# Patient Record
Sex: Male | Born: 1980 | Race: Black or African American | Hispanic: No | Marital: Single | Smoking: Former smoker
Health system: Southern US, Community
[De-identification: ages and names within clinical notes are randomized; demographics above are authoritative.]

## PROBLEM LIST (undated history)

## (undated) DIAGNOSIS — I219 Acute myocardial infarction, unspecified: Secondary | ICD-10-CM

## (undated) DIAGNOSIS — I2510 Atherosclerotic heart disease of native coronary artery without angina pectoris: Secondary | ICD-10-CM

## (undated) DIAGNOSIS — D649 Anemia, unspecified: Secondary | ICD-10-CM

## (undated) DIAGNOSIS — B20 Human immunodeficiency virus [HIV] disease: Secondary | ICD-10-CM

## (undated) DIAGNOSIS — Z8709 Personal history of other diseases of the respiratory system: Secondary | ICD-10-CM

## (undated) DIAGNOSIS — D849 Immunodeficiency, unspecified: Secondary | ICD-10-CM

## (undated) DIAGNOSIS — I252 Old myocardial infarction: Secondary | ICD-10-CM

## (undated) DIAGNOSIS — K219 Gastro-esophageal reflux disease without esophagitis: Secondary | ICD-10-CM

## (undated) DIAGNOSIS — E785 Hyperlipidemia, unspecified: Secondary | ICD-10-CM

## (undated) DIAGNOSIS — I255 Ischemic cardiomyopathy: Secondary | ICD-10-CM

## (undated) DIAGNOSIS — Z86718 Personal history of other venous thrombosis and embolism: Secondary | ICD-10-CM

## (undated) DIAGNOSIS — J189 Pneumonia, unspecified organism: Secondary | ICD-10-CM

## (undated) DIAGNOSIS — J449 Chronic obstructive pulmonary disease, unspecified: Secondary | ICD-10-CM

## (undated) DIAGNOSIS — L309 Dermatitis, unspecified: Secondary | ICD-10-CM

## (undated) DIAGNOSIS — J4540 Moderate persistent asthma, uncomplicated: Secondary | ICD-10-CM

## (undated) DIAGNOSIS — Z21 Asymptomatic human immunodeficiency virus [HIV] infection status: Secondary | ICD-10-CM

## (undated) DIAGNOSIS — I10 Essential (primary) hypertension: Secondary | ICD-10-CM

## (undated) HISTORY — PX: JOINT REPLACEMENT: SHX530

## (undated) HISTORY — DX: Atherosclerotic heart disease of native coronary artery without angina pectoris: I25.10

## (undated) HISTORY — DX: Dermatitis, unspecified: L30.9

## (undated) HISTORY — DX: Ischemic cardiomyopathy: I25.5

## (undated) HISTORY — DX: Old myocardial infarction: I25.2

## (undated) HISTORY — DX: Personal history of other venous thrombosis and embolism: Z86.718

## (undated) HISTORY — DX: Essential (primary) hypertension: I10

## (undated) HISTORY — DX: Chronic obstructive pulmonary disease, unspecified: J44.9

## (undated) HISTORY — DX: Human immunodeficiency virus (HIV) disease: B20

## (undated) HISTORY — DX: Asymptomatic human immunodeficiency virus (hiv) infection status: Z21

## (undated) HISTORY — DX: Moderate persistent asthma, uncomplicated: J45.40

## (undated) HISTORY — DX: Personal history of other diseases of the respiratory system: Z87.09

## (undated) HISTORY — DX: Hyperlipidemia, unspecified: E78.5

## (undated) SURGERY — VIDEO BRONCHOSCOPY WITHOUT FLUORO
Anesthesia: Moderate Sedation

---

## 2007-10-12 ENCOUNTER — Emergency Department (HOSPITAL_COMMUNITY): Admission: EM | Admit: 2007-10-12 | Discharge: 2007-10-12 | Payer: Self-pay | Admitting: Emergency Medicine

## 2008-05-24 ENCOUNTER — Emergency Department (HOSPITAL_COMMUNITY): Admission: EM | Admit: 2008-05-24 | Discharge: 2008-05-25 | Payer: Self-pay | Admitting: Emergency Medicine

## 2008-07-07 ENCOUNTER — Emergency Department (HOSPITAL_COMMUNITY): Admission: EM | Admit: 2008-07-07 | Discharge: 2008-07-08 | Payer: Self-pay | Admitting: Emergency Medicine

## 2008-08-29 ENCOUNTER — Emergency Department (HOSPITAL_COMMUNITY): Admission: EM | Admit: 2008-08-29 | Discharge: 2008-08-29 | Payer: Self-pay | Admitting: Emergency Medicine

## 2008-09-03 ENCOUNTER — Emergency Department (HOSPITAL_COMMUNITY): Admission: EM | Admit: 2008-09-03 | Discharge: 2008-09-03 | Payer: Self-pay | Admitting: Emergency Medicine

## 2008-09-12 ENCOUNTER — Emergency Department (HOSPITAL_COMMUNITY): Admission: EM | Admit: 2008-09-12 | Discharge: 2008-09-12 | Payer: Self-pay | Admitting: Emergency Medicine

## 2009-06-14 ENCOUNTER — Emergency Department (HOSPITAL_COMMUNITY): Admission: EM | Admit: 2009-06-14 | Discharge: 2009-06-14 | Payer: Self-pay | Admitting: Emergency Medicine

## 2009-06-15 ENCOUNTER — Encounter: Payer: Self-pay | Admitting: Emergency Medicine

## 2009-06-15 ENCOUNTER — Inpatient Hospital Stay (HOSPITAL_COMMUNITY): Admission: EM | Admit: 2009-06-15 | Discharge: 2009-06-24 | Payer: Self-pay | Admitting: Cardiology

## 2009-06-17 HISTORY — PX: CARDIAC CATHETERIZATION: SHX172

## 2009-06-24 HISTORY — PX: CARDIAC CATHETERIZATION: SHX172

## 2009-08-27 ENCOUNTER — Emergency Department (HOSPITAL_COMMUNITY): Admission: EM | Admit: 2009-08-27 | Discharge: 2009-08-27 | Payer: Self-pay | Admitting: Family Medicine

## 2009-12-09 HISTORY — PX: US ECHOCARDIOGRAPHY: HXRAD669

## 2010-02-19 ENCOUNTER — Emergency Department (HOSPITAL_COMMUNITY): Admission: EM | Admit: 2010-02-19 | Discharge: 2010-02-19 | Payer: Self-pay | Admitting: Emergency Medicine

## 2010-02-24 ENCOUNTER — Emergency Department (HOSPITAL_COMMUNITY): Admission: EM | Admit: 2010-02-24 | Discharge: 2010-02-24 | Payer: Self-pay | Admitting: Emergency Medicine

## 2010-02-25 ENCOUNTER — Emergency Department (HOSPITAL_COMMUNITY): Admission: EM | Admit: 2010-02-25 | Discharge: 2010-02-25 | Payer: Self-pay | Admitting: Emergency Medicine

## 2010-04-02 ENCOUNTER — Emergency Department (HOSPITAL_COMMUNITY): Admission: EM | Admit: 2010-04-02 | Discharge: 2010-04-02 | Payer: Self-pay | Admitting: Emergency Medicine

## 2010-09-08 ENCOUNTER — Ambulatory Visit: Payer: Self-pay | Admitting: Cardiovascular Disease

## 2010-10-01 ENCOUNTER — Emergency Department (HOSPITAL_COMMUNITY): Admission: EM | Admit: 2010-10-01 | Discharge: 2010-10-01 | Payer: Self-pay | Admitting: Emergency Medicine

## 2011-03-18 LAB — BASIC METABOLIC PANEL
BUN: 9 mg/dL (ref 6–23)
CO2: 26 mEq/L (ref 19–32)
Calcium: 8.6 mg/dL (ref 8.4–10.5)
Chloride: 103 mEq/L (ref 96–112)
GFR calc Af Amer: >60 mL/min (ref >60)
Glucose, Bld: 98 mg/dL (ref 70–99)

## 2011-03-18 LAB — PROTIME-INR
INR: 1.07 (ref 0.00–1.49)
Prothrombin Time: 13.8 seconds (ref 11.6–15.2)

## 2011-03-18 LAB — CBC
Hemoglobin: 14 g/dL (ref 13.0–17.0)
MCHC: 34.5 g/dL (ref 30.0–36.0)
RBC: 4.25 MIL/uL (ref 4.22–5.81)
WBC: 6.2 10*3/uL (ref 4.0–10.5)

## 2011-03-18 LAB — POCT CARDIAC MARKERS
CKMB, poc: <1 ng/mL — ABNORMAL LOW (ref 1.0–8.0)
Myoglobin, poc: 30.9 ng/mL (ref 12–200)
Troponin i, poc: <0.05 ng/mL (ref 0.00–0.09)

## 2011-03-18 LAB — CK TOTAL AND CKMB (NOT AT ARMC)
Relative Index: 0.7 (ref 0.0–2.5)
Total CK: 147 U/L (ref 7–232)

## 2011-03-18 LAB — RAPID URINE DRUG SCREEN, HOSP PERFORMED
Opiates: NOT DETECTED
Tetrahydrocannabinol: POSITIVE — AB

## 2011-03-18 LAB — DIFFERENTIAL
Monocytes Relative: 10 % (ref 3–12)
Neutro Abs: 3.6 10*3/uL (ref 1.7–7.7)
Neutrophils Relative %: 58 % (ref 43–77)

## 2011-03-18 LAB — TROPONIN I: Troponin I: 0.01 ng/mL (ref 0.00–0.06)

## 2011-04-04 LAB — POCT RAPID STREP A (OFFICE): Streptococcus, Group A Screen (Direct): NEGATIVE

## 2011-04-06 LAB — RAPID URINE DRUG SCREEN, HOSP PERFORMED
Amphetamines: NOT DETECTED
Barbiturates: POSITIVE — AB
Benzodiazepines: NOT DETECTED

## 2011-04-06 LAB — CBC
HCT: 32 % — ABNORMAL LOW (ref 39.0–52.0)
HCT: 33.8 % — ABNORMAL LOW (ref 39.0–52.0)
HCT: 34.4 % — ABNORMAL LOW (ref 39.0–52.0)
HCT: 36.5 % — ABNORMAL LOW (ref 39.0–52.0)
HCT: 36.8 % — ABNORMAL LOW (ref 39.0–52.0)
HCT: 40 % (ref 39.0–52.0)
HCT: 41.3 % (ref 39.0–52.0)
Hemoglobin: 11.3 g/dL — ABNORMAL LOW (ref 13.0–17.0)
Hemoglobin: 11.4 g/dL — ABNORMAL LOW (ref 13.0–17.0)
Hemoglobin: 12 g/dL — ABNORMAL LOW (ref 13.0–17.0)
Hemoglobin: 12.3 g/dL — ABNORMAL LOW (ref 13.0–17.0)
Hemoglobin: 12.3 g/dL — ABNORMAL LOW (ref 13.0–17.0)
Hemoglobin: 13.8 g/dL (ref 13.0–17.0)
Hemoglobin: 13.9 g/dL (ref 13.0–17.0)
MCHC: 33.4 g/dL (ref 30.0–36.0)
MCHC: 34.2 g/dL (ref 30.0–36.0)
MCHC: 34.4 g/dL (ref 30.0–36.0)
MCHC: 34.5 g/dL (ref 30.0–36.0)
MCHC: 34.5 g/dL (ref 30.0–36.0)
MCHC: 34.6 g/dL (ref 30.0–36.0)
MCHC: 35.3 g/dL (ref 30.0–36.0)
MCV: 92.6 fL (ref 78.0–100.0)
MCV: 94.1 fL (ref 78.0–100.0)
MCV: 94.8 fL (ref 78.0–100.0)
MCV: 94.9 fL (ref 78.0–100.0)
MCV: 94.9 fL (ref 78.0–100.0)
MCV: 94.9 fL (ref 78.0–100.0)
MCV: 95.3 fL (ref 78.0–100.0)
MCV: 95.3 fL (ref 78.0–100.0)
Platelets: 212 10*3/uL (ref 150–400)
Platelets: 217 10*3/uL (ref 150–400)
Platelets: 259 10*3/uL (ref 150–400)
RBC: 3.36 MIL/uL — ABNORMAL LOW (ref 4.22–5.81)
RBC: 3.63 MIL/uL — ABNORMAL LOW (ref 4.22–5.81)
RBC: 3.63 MIL/uL — ABNORMAL LOW (ref 4.22–5.81)
RBC: 3.7 MIL/uL — ABNORMAL LOW (ref 4.22–5.81)
RBC: 3.72 MIL/uL — ABNORMAL LOW (ref 4.22–5.81)
RBC: 3.83 MIL/uL — ABNORMAL LOW (ref 4.22–5.81)
RBC: 3.87 MIL/uL — ABNORMAL LOW (ref 4.22–5.81)
RBC: 3.88 MIL/uL — ABNORMAL LOW (ref 4.22–5.81)
RBC: 4.03 MIL/uL — ABNORMAL LOW (ref 4.22–5.81)
RBC: 4.28 MIL/uL (ref 4.22–5.81)
RBC: 4.35 MIL/uL (ref 4.22–5.81)
RDW: 13.3 % (ref 11.5–15.5)
RDW: 13.4 % (ref 11.5–15.5)
RDW: 13.4 % (ref 11.5–15.5)
WBC: 12.2 10*3/uL — ABNORMAL HIGH (ref 4.0–10.5)
WBC: 5.6 10*3/uL (ref 4.0–10.5)
WBC: 6 10*3/uL (ref 4.0–10.5)
WBC: 7 10*3/uL (ref 4.0–10.5)
WBC: 7.9 10*3/uL (ref 4.0–10.5)
WBC: 8.1 10*3/uL (ref 4.0–10.5)
WBC: 8.1 10*3/uL (ref 4.0–10.5)
WBC: 9.6 10*3/uL (ref 4.0–10.5)

## 2011-04-06 LAB — COMPREHENSIVE METABOLIC PANEL
Alkaline Phosphatase: 70 U/L (ref 39–117)
BUN: 9 mg/dL (ref 6–23)
CO2: 26 mEq/L (ref 19–32)
Calcium: 8.2 mg/dL — ABNORMAL LOW (ref 8.4–10.5)
GFR calc non Af Amer: >60 mL/min (ref >60)
Glucose, Bld: 105 mg/dL — ABNORMAL HIGH (ref 70–99)
Total Protein: 6.7 g/dL (ref 6.0–8.3)

## 2011-04-06 LAB — BASIC METABOLIC PANEL
BUN: 9 mg/dL (ref 6–23)
CO2: 28 mEq/L (ref 19–32)
CO2: 32 mEq/L (ref 19–32)
Calcium: 8.6 mg/dL (ref 8.4–10.5)
Chloride: 103 mEq/L (ref 96–112)
Chloride: 104 mEq/L (ref 96–112)
Chloride: 105 mEq/L (ref 96–112)
Chloride: 107 mEq/L (ref 96–112)
Chloride: 107 mEq/L (ref 96–112)
Creatinine, Ser: 0.87 mg/dL (ref 0.4–1.5)
Creatinine, Ser: 0.93 mg/dL (ref 0.4–1.5)
GFR calc Af Amer: >60 mL/min (ref >60)
GFR calc Af Amer: >60 mL/min (ref >60)
GFR calc Af Amer: >60 mL/min (ref >60)
GFR calc non Af Amer: >60 mL/min (ref >60)
GFR calc non Af Amer: >60 mL/min (ref >60)
Potassium: 3.1 mEq/L — ABNORMAL LOW (ref 3.5–5.1)
Potassium: 3.3 mEq/L — ABNORMAL LOW (ref 3.5–5.1)
Potassium: 4.4 mEq/L (ref 3.5–5.1)
Potassium: 4.4 mEq/L (ref 3.5–5.1)
Sodium: 133 mEq/L — ABNORMAL LOW (ref 135–145)
Sodium: 139 mEq/L (ref 135–145)
Sodium: 140 mEq/L (ref 135–145)

## 2011-04-06 LAB — HEPARIN LEVEL (UNFRACTIONATED)
Heparin Unfractionated: 0.1 IU/mL — ABNORMAL LOW (ref 0.30–0.70)
Heparin Unfractionated: 0.16 IU/mL — ABNORMAL LOW (ref 0.30–0.70)
Heparin Unfractionated: 0.52 IU/mL (ref 0.30–0.70)
Heparin Unfractionated: 0.61 IU/mL (ref 0.30–0.70)
Heparin Unfractionated: <0.1 IU/mL — ABNORMAL LOW (ref 0.30–0.70)

## 2011-04-06 LAB — POCT CARDIAC MARKERS
CKMB, poc: 2.4 ng/mL (ref 1.0–8.0)
Myoglobin, poc: 36.8 ng/mL (ref 12–200)
Myoglobin, poc: >500 ng/mL (ref 12–200)
Troponin i, poc: <0.05 ng/mL (ref 0.00–0.09)
Troponin i, poc: <0.05 ng/mL (ref 0.00–0.09)

## 2011-04-06 LAB — DIFFERENTIAL
Basophils Absolute: 0 10*3/uL (ref 0.0–0.1)
Basophils Relative: 0 % (ref 0–1)
Basophils Relative: 0 % (ref 0–1)
Eosinophils Absolute: 0 10*3/uL (ref 0.0–0.7)
Eosinophils Relative: 0 % (ref 0–5)
Lymphocytes Relative: 20 % (ref 12–46)
Lymphs Abs: 1.5 10*3/uL (ref 0.7–4.0)
Monocytes Relative: 11 % (ref 3–12)
Neutro Abs: 8.9 10*3/uL — ABNORMAL HIGH (ref 1.7–7.7)
Neutrophils Relative %: 76 % (ref 43–77)

## 2011-04-06 LAB — CARDIAC PANEL(CRET KIN+CKTOT+MB+TROPI)
CK, MB: 32.3 ng/mL — ABNORMAL HIGH (ref 0.3–4.0)
CK, MB: 58.8 ng/mL — ABNORMAL HIGH (ref 0.3–4.0)
Relative Index: 5.2 — ABNORMAL HIGH (ref 0.0–2.5)
Total CK: 622 U/L — ABNORMAL HIGH (ref 7–232)

## 2011-04-06 LAB — LIPID PANEL
HDL: 24 mg/dL — ABNORMAL LOW (ref >39)
LDL Cholesterol: 63 mg/dL (ref 0–99)
Total CHOL/HDL Ratio: 4 RATIO
Triglycerides: 47 mg/dL (ref <150)
VLDL: 9 mg/dL (ref 0–40)

## 2011-04-06 LAB — LIPASE, BLOOD: Lipase: 16 U/L (ref 11–59)

## 2011-04-06 LAB — CK TOTAL AND CKMB (NOT AT ARMC)
CK, MB: 146.5 ng/mL — ABNORMAL HIGH (ref 0.3–4.0)
Relative Index: 13.3 — ABNORMAL HIGH (ref 0.0–2.5)
Total CK: 1100 U/L — ABNORMAL HIGH (ref 7–232)

## 2011-04-06 LAB — APTT: aPTT: 43 seconds — ABNORMAL HIGH (ref 24–37)

## 2011-04-06 LAB — C-REACTIVE PROTEIN: CRP: 3.4 mg/dL — ABNORMAL HIGH (ref <0.6)

## 2011-05-12 NOTE — Cardiovascular Report (Signed)
NAMENOAL, Rodney Walker             ACCOUNT NO.:  [id number]   MEDICAL RECORD NO.:  [id number]          PATIENT TYPE:  INP   LOCATION:  3729                         FACILITY:  [facility]   PHYSICIAN:  Vesta Mixer, M.D. DATE OF BIRTH:  16-Nov-1981   DATE OF PROCEDURE:  06/18/2009  DATE OF DISCHARGE:                            CARDIAC CATHETERIZATION   Abbas Beyene is a 30 year old gentleman who was admitted over the  week with an acute anterior wall myocardial infarction.  He stabilized  quite nicely with heparin and Dilaudid and remained painfree.  He had  markedly elevated cardiac enzymes but was painfree and did not have any  further EKG changes.  He had a heart catheterization yesterday which  revealed an ulcerated plaque associated with a large thrombus.  He was  placed on Integrilin and was brought back today for restudy.  We were  prepared to do PTCA and stenting if his lesion looked any worse.   The procedure was a left heart catheterization with limited coronary  angiography.  We engaged the left main only.  We did not do a left  ventriculogram and did not do a right coronary artery injection.   The left main is smooth and normal.   Left anterior descending artery has 40-50% stenosis.  The lesion is  clean up quite nicely.  The large thrombus burden has improved  significantly.  The intramural hematoma seems to also have improved.  There is brisk flow down the vessel.   The first diagonal artery is normal.   The diagonal artery is normal.  The left circumflex artery is  essentially normal.   We will continue with Integrilin for another couple of days.  We will  discharge him on Plavix.  I do not think that he will need another heart  catheterization since he has made such a significant improvement.  We  will consider that over the next several days.  We will continue with  high-dose Crestor therapy.      Vesta Mixer, M.D.  Electronically Signed     PJN/MEDQ  D:  06/18/2009  T:  06/19/2009  Job:  [id number]

## 2011-05-12 NOTE — Cardiovascular Report (Signed)
NAMELYNKIN, SAINI             ACCOUNT NO.:  [id number]   MEDICAL RECORD NO.:  [id number]           PATIENT TYPE:   LOCATION:                                 FACILITY:   PHYSICIAN:  Vesta Mixer, M.D. DATE OF BIRTH:  January 07, 1981   DATE OF PROCEDURE:  06/24/2009  DATE OF DISCHARGE:                            CARDIAC CATHETERIZATION   Rodney Walker is a 30 year old gentleman who presented last week with  an anterior wall myocardial infarction.  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  He was found to have a moderately tight stenosis  with a large subintimal hematoma and thrombus within the lumen of the  LAD.  He was treated with Integrilin for several days and Plavix.  Repeat heart catheterization the following day revealed some  improvement.  He was then scheduled for repeat heart catheterization  today prior to discharge.   The procedure was left heart catheterization with coronary angiography.   The right femoral artery was easily cannulated using a modified  Seldinger technique.  At the end of the case, a right femoral angiogram  revealed satisfactory landmarks and a satisfactory lumen to perform  Angio-Seal.  The vessel was closed with Angio-Seal using a standard  fashion.   HEMODYNAMICS:  LV pressure is 103/7.  The aortic pressure was 103/66.   Angiography:  Left main:  The left main is smooth and normal.   The left anterior descending artery has healed up quite nicely.  The  proximal LAD has only minor luminal irregularities.  There is no  evidence of thrombus, and there is no subintimal hematoma.  There is no  evidence of dissection.  There is brisk flow.  The septal branches,  which had been previously slightly compromised now have brisk flow.   The first diagonal artery is normal.  The remaining LAD is normal.   The left circumflex artery is large and normal.  The obtuse  marginal  artery is normal.  The posterolateral segment artery is normal.   The right coronary artery is large and normal.  The posterior descending  artery is normal.   The left ventriculogram was performed in the 30-RAO position.  It  revealed very small area of apical hypokinesis.  The overall ejection  fraction is normal with an EF of 60%.  The apex is healed up quite  nicely as well.   COMPLICATIONS:  None.   CONCLUSIONS:  Minor irregularities in the proximal left anterior  descending.  This lesion has healed up quite nicely on Plavix.  We will  continue with Plavix and aspirin as an outpatient.  He will see me in  followup.  We will continue with the same medications.  We will  anticipate discharge from hospital today.      Vesta Mixer, M.D.  Electronically Signed     PJN/MEDQ  D:  06/24/2009  T:  06/24/2009  Job:  [id number]

## 2011-05-12 NOTE — Discharge Summary (Signed)
NAMELELON, IKARD             ACCOUNT NO.:  [id number]   MEDICAL RECORD NO.:  [id number]          PATIENT TYPE:  INP   LOCATION:  3739                         FACILITY:  [facility]   PHYSICIAN:  Vesta Mixer, M.D. 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   DISCHARGE MEDICATIONS:  1. Plavix 75 mg a day for at least 3 months.  2. Aspirin 81 mg a day.  3. Pepcid 20 mg, over the counter, once a day.  4. Simvastatin 40 mg a day.  5. Metoprolol ER 25 mg once a day.  6. Potassium chloride 10 mEq once a day.   DISPOSITION:  The patient will see Dr. Elease Hashimoto in several weeks.  We will  be giving him a 14 days supply of Plavix upon discharge.   HISTORY:  [name] is a 30 year old gentleman, who was admitted on June 15, 2009, by Dr. Patty Sermons for chest pain.  He was treated with heparin  and nitroglycerin, and the pain resolved very quickly.  The patient  ruled in for myocardial infarction.  Please see dictated H and P.   HOSPITAL COURSE BY PROBLEMS:  1. Chest pain:  The patient ruled in for myocardial infarction.  He      had T-wave inversions anteriorly, consistent with an anterior wall      myocardial infarction, but his symptoms were very stable.  He had a      catheterization the following Monday, which revealed a large      thrombus burden in the proximal LAD, associated with a moderate 50-      60% stenosis.  There appeared to be a spontaneous dissection of the      LAD.  We started him on Integrilin and restudied him the next day.      By that time, the thrombus had greatly decreased in size and the      vessel has started to heal.  We decided not to place a stent but      rather continue to watch.  He was treated with Integrilin for  another couple of days and was started on Plavix.  We re-cathed him      on June 24, 2009, and his vessel is now completely healed.  There      is virtually no stenosis at all in the proximal LAD.  There is no      associated thrombus, and the subintimal hematoma is now resolved.      There is brisk flow through the LAD.  His left ventriculogram reveals a normal ejection fraction of 60%.  There is hypokinesis of the apex, but this also has greatly improved  since last week.  The patient is now stable upon discharge.  He will be  discharged on the above-noted medications.  The patient has been  instructed to stop smoking.  We will treat him with simvastatin  empirically.  We will follow him in the office.  1. Hypokalemia:  The patient's potassium was 3.1 upon admission.  We      will send home on potassium chloride 10 mEq a day.      Vesta Mixer, M.D.  Electronically Signed     PJN/MEDQ  D:  06/24/2009  T:  06/24/2009  Job:  [id number]

## 2011-05-12 NOTE — H&P (Signed)
Rodney Walker, Rodney Walker             ACCOUNT NO.:  [id number]   MEDICAL RECORD NO.:  [id number]          PATIENT TYPE:  INP   LOCATION:  3729                         FACILITY:  [facility]   PHYSICIAN:  Cassell Clement, M.D. DATE OF BIRTH:  09/24/1981   DATE OF ADMISSION:  06/15/2009  DATE OF DISCHARGE:                              HISTORY & PHYSICAL   CHIEF COMPLAINT:  Chest pain.   HISTORY:  This is a 30 year old single African American male admitted  with chest pain and positive cardiac enzymes.  He does not have any  prior history of known heart problems.  He has been sick for about a  week.  Initially he has been having diarrhea for the past week and a  feeling of gas in his chest.  He had the onset of chest pain on the  evening of Thursday, June 13, 2009.  He was seen at [facility] early  Friday morning where two sets of enzymes were negative and he was sent  home with Prilosec.  The pain recurred last night and the patient  decided to go to [facility] this time, and this time his enzymes were  positive and his EKG showed changes suggesting benign early  repolarization.  A drug screen was positive for marijuana, but negative  for cocaine.  The patient was given IV Dilaudid 1 mg and was given  Zofran intravenously in the emergency room at [facility] after  which his pain has disappeared.  He subsequently also had a CT angiogram  at [facility] which showed no evidence of pulmonary embolus or  dissection or other abnormality.  He is admitted now for further  evaluation at [facility].   FAMILY HISTORY:  Negative for premature coronary artery disease.  His  father died of liver problem at age 59 and mother is still living at age  18, had diabetes but no known coronary artery disease.  The patient has  7 siblings, none of whom have heart problems.   SOCIAL HISTORY:  The patient is single.  He is presently unemployed.  He  smokes occasional cigarettes and occasional alcohol.  The  patient has  never been hospitalized.  He has never had any surgery.  He has no known  drug allergies.   REVIEW OF SYSTEMS:  GASTROINTESTINAL:  Recent diarrhea.  He has not been  aware of any hematochezia or melena.  GENITOURINARY:  No symptoms.  RESPIRATORY:  He had pneumonia in October and was seen in the emergency  room and was treated as an outpatient.  Remainder of review of systems  negative in detail.   PHYSICAL EXAMINATION:  VITAL SIGNS:  Blood pressure of 126/80, pulse is  66 and regular, and respirations are normal on oxygen.  GENERAL:  This is a well-developed, well-nourished, muscular African  American young man in no acute distress.  SKIN:  Clear.  HEENT:  Pupils were equal and reactive.  Sclerae nonicteric.  Mouth and  pharynx normal.  NECK:  Carotids normal.  Jugular venous pressure normal.  Thyroid  normal.  CHEST:  Clear to percussion and auscultation.  HEART:  A quiet precordium without murmur, gallop, rub, or click.  ABDOMEN:  Soft and nontender without hepatosplenomegaly or masses.  EXTREMITIES:  Good peripheral pulses.  No edema.  No phlebitis.  NEUROLOGIC:  Physiologic.   CT angio at rest of the lungs showed no dissection and no pulmonary  emboli.   LABORATORY STUDIES:  Hemoglobin of 13.8, white count 11,700.  Sodium  137, potassium 3.2, BUN 9, creatinine 0.92, and blood sugar 111.  His  AST is elevated at 73, albumin is 2.9.  Troponin is 11.45, total CK is  1100, and MB is 146.5.   IMPRESSION:  Chest pain and cardiac enzymes consistent with non-Q  myocardial infarction.  No evidence historically or by drug screen of  cocaine abuse.  The patient is presently painfree and has been painfree  since given medication at [facility] Room this morning.  He  does not have any obvious risk factors for premature coronary artery  disease, although we do not have his lipids back yet.   PLAN:  To admit to telemetry.  We will get serial enzymes and EKGs.   We  will check lipids.  We will replete his potassium.  We will treat with  IV heparin.  We will add IV nitrates if chest pain recurs.  We will  anticipate cardiac catheterization on Monday morning by Dr. Delane Ginger,  or sooner if he becomes unstable.           ______________________________  Cassell Clement, M.D.     TB/MEDQ  D:  06/15/2009  T:  06/16/2009  Job:  [id number]

## 2011-05-12 NOTE — Cardiovascular Report (Signed)
NAMELEEVON, UPPERMAN NO.:  [id number]   MEDICAL RECORD NO.:  [id number]           PATIENT TYPE:   LOCATION:                                 FACILITY:   PHYSICIAN:  Vesta Mixer, M.D. DATE OF BIRTH:  22-Apr-1981   DATE OF PROCEDURE:  06/17/2009  DATE OF DISCHARGE:                            CARDIAC CATHETERIZATION   [name] is a 30 year old gentleman with no past medical history.  He  was admitted to the hospital with episodes of chest pain and was found  to have markedly positive cardiac enzymes.  He was found to have T-wave  inversions in the anterior leads consistent with a subendocardial  myocardial infarction.  He was referred for heart catheterization for  further evaluation.  He became pain free after some Dilaudid.  He was  maintained on heparin over the weekend.   The procedure was left heart catheterization with coronary angiography.   HEMODYNAMICS:  LV pressure is 103/16 with an aortic pressure of 105/70.   ANGIOGRAPHY:  1. Left main:  The left main is fairly smooth and normal.  2. The left anterior descending artery has a 40-50% stenosis in the      proximal edge of the vessel.  This lesion is fairly proximal,      although there is somewhat of a landing zone if this lesion needs      to be stented.  Associated with this lesion, there is a large      loosened filling defect that appears to be consistent with      thrombus.  There is no dissection plane seen in multiple views.      The remaining LAD appears to be fairly normal.  There are a few      minor luminal irregularities.  The first diagonal artery is normal.  3. The ramus intermediate vessel is large to moderate in size and is      normal.  4. The left circumflex artery is a moderate-sized vessel.  It gives      off 2 moderate-sized marginal branches, both of which are normal.  5. The right coronary artery is large and dominant and is normal.  The      posterior descending artery is a  fairly large branch and is normal.  6. The left ventriculogram was performed in the 30 RAO position.  It      reveals anterior hypokinesis.  The ejection fraction is      approximately 45%.   COMPLICATIONS:  None.   CONCLUSIONS:  Moderate coronary artery disease.  There is a 40-50%  stenosis in the proximal left anterior descending artery; however, this  lesion appears to have a very large thrombus associated with it.  I did  not place an ultrasound catheter down for fear of knocking loose this  thrombus.  It is also possible that this could represent a  plaque disruption, although I do not see any evidence of ulceration and  there are no dissection planes.  We will treat the patient with heparin  and Integrilin for the next several days, and we will  bring him back for  restudy in several days.  We will start him on heparin and Integrilin.      Vesta Mixer, M.D.  Electronically Signed     PJN/MEDQ  D:  06/17/2009  T:  06/17/2009  Job:  [id number]   cc:   Elmore Guise., M.D.

## 2011-09-24 LAB — RAPID STREP SCREEN (MED CTR MEBANE ONLY): Streptococcus, Group A Screen (Direct): NEGATIVE

## 2011-09-28 LAB — POCT I-STAT, CHEM 8
BUN: 7
Calcium, Ion: 1.16
Chloride: 105
Creatinine, Ser: 1.2
Glucose, Bld: 94
TCO2: 28

## 2011-11-02 ENCOUNTER — Ambulatory Visit: Payer: Self-pay | Admitting: Cardiovascular Disease

## 2011-11-12 ENCOUNTER — Encounter: Payer: Self-pay | Admitting: Cardiovascular Disease

## 2011-11-13 ENCOUNTER — Ambulatory Visit (INDEPENDENT_AMBULATORY_CARE_PROVIDER_SITE_OTHER): Payer: Self-pay | Admitting: Cardiovascular Disease

## 2011-11-13 ENCOUNTER — Encounter: Payer: Self-pay | Admitting: Cardiovascular Disease

## 2011-11-13 VITALS — BP 114/75 | HR 91 | Ht 65.0 in | Wt 146.8 lb

## 2011-11-13 DIAGNOSIS — I2510 Atherosclerotic heart disease of native coronary artery without angina pectoris: Secondary | ICD-10-CM | POA: Insufficient documentation

## 2011-11-13 NOTE — Progress Notes (Signed)
Corrado Hymon Date of Birth  1981-08-12 [facility] [address]    [address] [place], [place]  [address] [phone]  Fax  [phone]  History of Present Illness:  Marja Kays is a 30 yo gentleman with a hx of spontaneous dissection of his LAD.  He was on Plavix for a year.  He is now on Aspirin but admits to not taking it regularly.    He remains active,  He works at [employer].  He does not get any regular aerobic exercise.  Current Outpatient Prescriptions on File Prior to Visit  Medication Sig Dispense Refill  . aspirin 81 MG tablet Take 81 mg by mouth as needed.         No Known Allergies  Past Medical History  Diagnosis Date  . Chest pain 2010  . Hyperlipidemia   . Coronary artery disease     SPONTANEOUS DISSECTION AND PLAQUE RUPTURE OF HIS LEFT ANTERIOR DESCENDING ARTERY    Past Surgical History  Procedure Date  . Cardiac catheterization 06/24/2009    EF 60%  . Cardiac catheterization 06/17/2009    EF 45%. ANTERIOR HYPOKINESIS  . US echocardiography 12/09/2009    EF 55-60%    History  Smoking status  . Current Some Day Smoker  Smokeless tobacco  . Not on file    History  Alcohol Use  . Yes    occassional    Family History  Problem Relation Age of Onset  . Diabetes Mother   . Liver cancer Father     Reviw of Systems:  Reviewed in the HPI.  All other systems are negative.  Physical Exam: BP 114/75  Pulse 91  Ht 5\' 5"  (1.651 m)  Wt 146 lb 12.8 oz (66.588 kg)  BMI 24.43 kg/m2 The patient is alert and oriented x 3.  The mood and affect are normal.   Skin: warm and dry.  Color is normal.    HEENT:   Normocephalic/atraumatic. His mucous membranes are moist. His carotids are normal. There is no JVD.  Lungs: Lungs are clear.   Heart: Regular rate S1-S2.    Abdomen: Good bowel sounds. His abdomen is then to  Extremities:  No clubbing cyanosis or edema.  Neuro:  Nonfocal  ECG: Normal sinus rhythm. Normal EKG.  Assessment /  Plan:

## 2011-11-13 NOTE — Assessment & Plan Note (Signed)
Rodney Walker is doing very well. I'm pleased he's had any further episodes of chest pain or shortness breath. He still smokes a few cigarettes a day and have encouraged him to stop completely. He's had a spontaneous dissection of his left anterior descending artery and he is at risk of an another dissection.  I've asked him to take his aspirin on a daily basis. I'll see him again in one year.

## 2011-11-13 NOTE — Patient Instructions (Signed)
Your physician wants you to follow-up in: 1 year. You will receive a reminder letter in the mail two months in advance. If you don't receive a letter, please call our office to schedule the follow-up appointment.  

## 2012-07-19 ENCOUNTER — Encounter (HOSPITAL_COMMUNITY): Payer: Self-pay | Admitting: Emergency Medicine

## 2012-07-19 ENCOUNTER — Emergency Department (HOSPITAL_COMMUNITY)
Admission: EM | Admit: 2012-07-19 | Discharge: 2012-07-19 | Disposition: A | Discharge status: Home / Self Care | Payer: Self-pay | Attending: Emergency Medicine | Admitting: Emergency Medicine

## 2012-07-19 DIAGNOSIS — F17200 Nicotine dependence, unspecified, uncomplicated: Secondary | ICD-10-CM | POA: Insufficient documentation

## 2012-07-19 DIAGNOSIS — K029 Dental caries, unspecified: Secondary | ICD-10-CM | POA: Insufficient documentation

## 2012-07-19 DIAGNOSIS — L03211 Cellulitis of face: Secondary | ICD-10-CM | POA: Insufficient documentation

## 2012-07-19 DIAGNOSIS — E785 Hyperlipidemia, unspecified: Secondary | ICD-10-CM | POA: Insufficient documentation

## 2012-07-19 DIAGNOSIS — J029 Acute pharyngitis, unspecified: Secondary | ICD-10-CM | POA: Insufficient documentation

## 2012-07-19 DIAGNOSIS — L0201 Cutaneous abscess of face: Secondary | ICD-10-CM | POA: Insufficient documentation

## 2012-07-19 DIAGNOSIS — I2510 Atherosclerotic heart disease of native coronary artery without angina pectoris: Secondary | ICD-10-CM | POA: Insufficient documentation

## 2012-07-19 MED ORDER — CLINDAMYCIN HCL 150 MG PO CAPS: 450.0000 mg | ORAL_CAPSULE | Freq: Three times a day (TID) | ORAL | Status: AC

## 2012-07-19 MED ORDER — HYDROCODONE-ACETAMINOPHEN 5-325 MG PO TABS: 1.0000 | ORAL_TABLET | ORAL | Status: DC | PRN

## 2012-07-19 MED ORDER — CLINDAMYCIN HCL 150 MG PO CAPS: 450.0000 mg | ORAL_CAPSULE | Freq: Three times a day (TID) | ORAL | Status: DC

## 2012-07-19 MED ORDER — HYDROCODONE-ACETAMINOPHEN 5-325 MG PO TABS: 1.0000 | ORAL_TABLET | ORAL | Status: AC | PRN

## 2012-07-19 NOTE — ED Provider Notes (Signed)
History     CSN: [id number]  Arrival date & time 07/19/12  1422   First MD Initiated Contact with Patient 07/19/12 1505      No chief complaint on file.   (Consider location/radiation/quality/duration/timing/severity/associated sxs/prior treatment) The history is provided by the patient.   patient is a 31 year old male with past medical history of coronary disease who presents to the emergency department with a chief complaint of facial swelling and drainage. He notes that over the last 6 or 7 months, he has had fluctuating swelling to either side of his face that is often associated with purulent drainage. He has not noticed any redness or excess warmth to the area. No measured fever. He denies any dental pain, though he does report intermittently bleeding gums and known poor dentition. With the current episode of swelling, he has had a right-sided sore throat without a cough. He denies any eye pain, visual change, ear pain, neck swelling or stiffness. Symptoms are worse with palpation of the area. No alleviating factors. No prior treatment has been soft. He occasionally applies warm compresses with subsequent drainage.  Past Medical History  Diagnosis Date  . Chest pain 2010  . Hyperlipidemia   . Coronary artery disease     SPONTANEOUS DISSECTION AND PLAQUE RUPTURE OF HIS LEFT ANTERIOR DESCENDING ARTERY    Past Surgical History  Procedure Date  . Cardiac catheterization 06/24/2009    EF 60%  . Cardiac catheterization 06/17/2009    EF 45%. ANTERIOR HYPOKINESIS  . US echocardiography 12/09/2009    EF 55-60%    Family History  Problem Relation Age of Onset  . Diabetes Mother   . Liver cancer Father     History  Substance Use Topics  . Smoking status: Current Some Day Smoker  . Smokeless tobacco: Not on file  . Alcohol Use: Yes     occassional      Review of Systems  Constitutional: Negative for fever and chills.  HENT:       See history of present illness, otherwise  negative  Eyes: Negative for pain and visual disturbance.  Skin: Positive for wound.  Neurological: Negative for dizziness and headaches.    Allergies  Review of patient's allergies indicates no known allergies.  Home Medications   Current Outpatient Rx  Name Route Sig Dispense Refill  . ASPIRIN 81 MG PO TABS Oral Take 81 mg by mouth as needed.       BP 116/77  Pulse 83  Temp 98.2 F (36.8 C) (Oral)  Resp 16  SpO2 100%  Physical Exam  Nursing note reviewed. Constitutional:       Vital signs are reviewed and are normal. Patient sitting on stretcher, in no acute distress.   well-developed well-nourished African American male appearance consistent with stated age.  Head is normocephalic and atraumatic. Bilateral external ears are normal. Generalized poor dentition with several teeth demonstrating significant decay. No bleeding gums. No swelling or pain to palpation of the gingiva, no fluctuance or abscess seen. Oromucosa are moist. There is slight swelling of bilateral tonsils with exudate seen on the right only. No peritonsillar abscess is seen. Airway is patent. Uvula is midline without swelling. No trismus. Normal spoken voice. Conjunctiva normal. Extraocular movements intact. Neck is supple without adenopathy. Heart regular rate and rhythm. Normal respiratory effort and excursion. Extremities without edema. Patient is alert and oriented x3. Mental status appears appropriate for patient and situation. Skin is warm and dry. To the area of skin overlying  the right mandible posteriorly, there is a 2.5 cm area of induration. A 1 mm wound opening with expressible purulent material is noted. A moderate amount of purulent material is expressed from the wound. After expression, there is no fluctuance to the area. 2 the left side of the face and asymmetric location, there is a 2 cm area of induration. There is no fluctuance to the center of this area and no drainage is able to be expressed.  Neither area has erythema or excess warmth to the overlying skin.  ED Course  Procedures (including critical care time)  Labs Reviewed - No data to display No results found.   1. Facial abscess   2. Dental caries   3. Pharyngitis       MDM  Skin infection. Although dentition is poor, there is no tenderness to percussion of any teeth and there is no tenderness dictation of the gingiva to suggest that bilateral areas of facial induration are related to spread of infection from the oral cavity. Given chronicity I suspect related to shaving and skin infection. Sore throat with small amount of exudate is seen. Patient has had no fever and demonstrates no lymphadenopathy, strep screen is not warranted especially given the fact that he we placed on antibiotics that are sufficient to treat strep pharyngitis should this be present. I discussed with him the risks and benefits of opening his right-sided area of induration further and he desires to proceed with antibiotic treatment, warm compresses, and a two-day recheck. We discussed at length return precautions and he voices understanding. Dental resources will also be given a discharge.        Shaaron Adler, [place] 07/19/12 1536

## 2012-07-19 NOTE — Progress Notes (Signed)
CM spoke with pt who confirms self pay [facility] resident with no have a pcp county. Discussed the importance of a pcp for f/u. Reviewed Health connect number to assist with finding self pay provider close to pt's residence. Pt also seen by [facility] coordinator.  Reviewed resources for [facility], general [facility] clinics, medications-needymeds.com, housing, DSS, health Department and other resources in [employer] including crisis programs Pt voiced understanding and appreciation of resources provided

## 2012-07-19 NOTE — ED Provider Notes (Signed)
Medical screening examination/treatment/procedure(s) were performed by non-physician practitioner and as supervising physician I was immediately available for consultation/collaboration.  Keaten Mashek, MD 07/19/12 1707 

## 2012-07-19 NOTE — ED Notes (Signed)
Pt has swelling and pain to right side of face. Reports drainage from abscess on right face on last pm. Reports pain 10/10.

## 2012-08-30 ENCOUNTER — Emergency Department (HOSPITAL_COMMUNITY): Payer: Self-pay

## 2012-08-30 ENCOUNTER — Encounter (HOSPITAL_COMMUNITY): Payer: Self-pay | Admitting: Emergency Medicine

## 2012-08-30 ENCOUNTER — Emergency Department (HOSPITAL_COMMUNITY)
Admission: EM | Admit: 2012-08-30 | Discharge: 2012-08-30 | Discharge status: Against Medical Advice | Payer: Self-pay | Attending: Emergency Medicine | Admitting: Emergency Medicine

## 2012-08-30 DIAGNOSIS — IMO0002 Reserved for concepts with insufficient information to code with codable children: Secondary | ICD-10-CM | POA: Insufficient documentation

## 2012-08-30 DIAGNOSIS — T148XXA Other injury of unspecified body region, initial encounter: Secondary | ICD-10-CM

## 2012-08-30 NOTE — ED Notes (Signed)
Pt up to nurses station stating he had to leave. Pt encouraged to stay. Left AMA. NAD noted.

## 2012-08-30 NOTE — ED Notes (Signed)
Pt with small avulsion to knuckle of right hand caused by a sanding machine at work.

## 2013-04-02 ENCOUNTER — Encounter (HOSPITAL_COMMUNITY): Payer: Self-pay | Admitting: Emergency Medicine

## 2013-04-02 ENCOUNTER — Emergency Department (HOSPITAL_COMMUNITY)
Admission: EM | Admit: 2013-04-02 | Discharge: 2013-04-02 | Disposition: A | Discharge status: Home / Self Care | Payer: Self-pay | Attending: Emergency Medicine | Admitting: Emergency Medicine

## 2013-04-02 DIAGNOSIS — E785 Hyperlipidemia, unspecified: Secondary | ICD-10-CM | POA: Insufficient documentation

## 2013-04-02 DIAGNOSIS — Z7982 Long term (current) use of aspirin: Secondary | ICD-10-CM | POA: Insufficient documentation

## 2013-04-02 DIAGNOSIS — Z8679 Personal history of other diseases of the circulatory system: Secondary | ICD-10-CM | POA: Insufficient documentation

## 2013-04-02 DIAGNOSIS — L539 Erythematous condition, unspecified: Secondary | ICD-10-CM | POA: Insufficient documentation

## 2013-04-02 DIAGNOSIS — K0889 Other specified disorders of teeth and supporting structures: Secondary | ICD-10-CM

## 2013-04-02 DIAGNOSIS — K029 Dental caries, unspecified: Secondary | ICD-10-CM | POA: Insufficient documentation

## 2013-04-02 DIAGNOSIS — K089 Disorder of teeth and supporting structures, unspecified: Secondary | ICD-10-CM | POA: Insufficient documentation

## 2013-04-02 DIAGNOSIS — J029 Acute pharyngitis, unspecified: Secondary | ICD-10-CM | POA: Insufficient documentation

## 2013-04-02 DIAGNOSIS — F17200 Nicotine dependence, unspecified, uncomplicated: Secondary | ICD-10-CM | POA: Insufficient documentation

## 2013-04-02 MED ORDER — NAPROXEN 500 MG PO TABS: 500.0000 mg | ORAL_TABLET | Freq: Two times a day (BID) | ORAL | Status: DC

## 2013-04-02 MED ORDER — PENICILLIN V POTASSIUM 500 MG PO TABS: 500.0000 mg | ORAL_TABLET | Freq: Three times a day (TID) | ORAL | Status: DC

## 2013-04-02 NOTE — ED Provider Notes (Signed)
History    This chart was scribed for non-physician practitioner Renne Crigler PA-C working with Richardean Canal, MD by Smitty Pluck, ED scribe. This patient was seen in room WTR9/WTR9 and the patient's care was started at 9:08 PM.   CSN: [id number]  Arrival date & time 04/02/13  2047       Chief Complaint  Patient presents with  . Sore Throat     The history is provided by the patient. No language interpreter was used.   Rodney Walker is a 32 y.o. male  With hx of MI (3 years ago) who presents to the Emergency Department complaining of constant, moderate sore throat onset 5 days ago. He has taken ibuprofen without relief. Pt reports that swallowing aggravates the pain. Pt also mentions having dental pain.  Pt denies fever, chills, nausea, vomiting, diarrhea, weakness, cough, SOB and any other pain. He states that he does not take any medication for heart health but he was taken off by cardiologist (pt is unsure of cardiologist name). He denies hx of DM.   Past Medical History  Diagnosis Date  . Chest pain 2010  . Hyperlipidemia   . Coronary artery disease     SPONTANEOUS DISSECTION AND PLAQUE RUPTURE OF HIS LEFT ANTERIOR DESCENDING ARTERY  . MI (myocardial infarction)     Past Surgical History  Procedure Laterality Date  . Cardiac catheterization  06/24/2009    EF 60%  . Cardiac catheterization  06/17/2009    EF 45%. ANTERIOR HYPOKINESIS  . US echocardiography  12/09/2009    EF 55-60%    Family History  Problem Relation Age of Onset  . Diabetes Mother   . Liver cancer Father     History  Substance Use Topics  . Smoking status: Current Some Day Smoker  . Smokeless tobacco: Not on file  . Alcohol Use: Yes     Comment: occassional      Review of Systems  Constitutional: Negative for fever, chills and fatigue.  HENT: Positive for sore throat and dental problem. Negative for ear pain, congestion, rhinorrhea, trouble swallowing, neck pain, neck stiffness, voice change  and sinus pressure.   Eyes: Negative for redness.  Respiratory: Negative for cough, shortness of breath and wheezing.   Gastrointestinal: Negative for nausea, vomiting, abdominal pain and diarrhea.  Genitourinary: Negative for dysuria.  Musculoskeletal: Negative for myalgias.  Skin: Negative for rash.  Neurological: Negative for weakness and headaches.  Hematological: Negative for adenopathy.    Allergies  Review of patient's allergies indicates no known allergies.  Home Medications   Current Outpatient Rx  Name  Route  Sig  Dispense  Refill  . aspirin 81 MG tablet   Oral   Take 81 mg by mouth as needed. For blood thinner.         Marland Kitchen ibuprofen (ADVIL,MOTRIN) 200 MG tablet   Oral   Take 200 mg by mouth every 6 (six) hours as needed for pain.         . naproxen sodium (ANAPROX) 220 MG tablet   Oral   Take 220 mg by mouth 2 (two) times daily as needed. Throat pain           BP 107/62  Pulse 64  Temp(Src) 98.9 F (37.2 C) (Oral)  Resp 16  SpO2 100%  Physical Exam  Nursing note and vitals reviewed. Constitutional: He appears well-developed and well-nourished. No distress.  HENT:  Head: Normocephalic and atraumatic.  Right Ear: External ear normal.  Left  Ear: External ear normal.  Nose: Nose normal.  Mouth/Throat: No oral lesions. Dental caries present. No dental abscesses. Posterior oropharyngeal erythema present. No oropharyngeal exudate.  Eyes: Conjunctivae and EOM are normal.  Neck: Normal range of motion. Neck supple. No tracheal deviation present.  Cardiovascular: Normal rate.   Pulmonary/Chest: Effort normal. No respiratory distress.  Musculoskeletal: Normal range of motion.  Lymphadenopathy:    He has no cervical adenopathy.  Neurological: He is alert.  Skin: Skin is warm and dry.  Psychiatric: He has a normal mood and affect. His behavior is normal.    ED Course  Procedures (including critical care time) DIAGNOSTIC STUDIES: Oxygen Saturation is  100% on room air, normal by my interpretation.    COORDINATION OF CARE: 9:14 PM Discussed ED treatment with pt and pt agrees.   Filed Vitals:   04/02/13 2103  BP: 107/62  Pulse: 64  Temp: 98.9 F (37.2 C)  TempSrc: Oral  Resp: 16  SpO2: 100%    Labs Reviewed  RAPID STREP SCREEN   No results found.   1. Sore throat    Patient seen and examined.    Vital signs reviewed and are as follows: Filed Vitals:   04/02/13 2103  BP: 107/62  Pulse: 64  Temp: 98.9 F (37.2 C)  Resp: 16   Strep screen negative. Patient informed.  Will give penicillin 2/2 dental pain.  Patient urged to return with worsening symptoms or other concerns. Patient verbalized understanding and agrees with plan.    MDM  Patient with sore throat and erythema of the throat. Strep negative. Patient has full range of motion in neck and I do not suspect deep space tissue abscess. No evidence of peritonsillar abscess. Patient also has facial and dental pain. Unsure if these are related. Will give penicillin for possible dental infection. Patient appears well, nontoxic. Return instructions given.      I personally performed the services described in this documentation, which was scribed in my presence. The recorded information has been reviewed and is accurate.   Renne Crigler, PA-C 04/03/13 [phone]

## 2013-04-02 NOTE — ED Notes (Signed)
Patient states that he has a soreness to his right throat x 5 days. Denies any fever. Pain with swallowing, denies any trouble breathing or swallowing

## 2013-04-05 NOTE — ED Provider Notes (Signed)
Medical screening examination/treatment/procedure(s) were performed by non-physician practitioner and as supervising physician I was immediately available for consultation/collaboration.   David H Yao, MD 04/05/13 2256 

## 2013-06-27 ENCOUNTER — Emergency Department (HOSPITAL_COMMUNITY)
Admission: EM | Admit: 2013-06-27 | Discharge: 2013-06-27 | Disposition: A | Discharge status: Home / Self Care | Payer: Self-pay | Attending: Emergency Medicine | Admitting: Emergency Medicine

## 2013-06-27 ENCOUNTER — Encounter (HOSPITAL_COMMUNITY): Payer: Self-pay | Admitting: Emergency Medicine

## 2013-06-27 DIAGNOSIS — Z9861 Coronary angioplasty status: Secondary | ICD-10-CM | POA: Insufficient documentation

## 2013-06-27 DIAGNOSIS — I252 Old myocardial infarction: Secondary | ICD-10-CM | POA: Insufficient documentation

## 2013-06-27 DIAGNOSIS — Z862 Personal history of diseases of the blood and blood-forming organs and certain disorders involving the immune mechanism: Secondary | ICD-10-CM | POA: Insufficient documentation

## 2013-06-27 DIAGNOSIS — Z8639 Personal history of other endocrine, nutritional and metabolic disease: Secondary | ICD-10-CM | POA: Insufficient documentation

## 2013-06-27 DIAGNOSIS — F17200 Nicotine dependence, unspecified, uncomplicated: Secondary | ICD-10-CM | POA: Insufficient documentation

## 2013-06-27 DIAGNOSIS — K219 Gastro-esophageal reflux disease without esophagitis: Secondary | ICD-10-CM | POA: Insufficient documentation

## 2013-06-27 DIAGNOSIS — I2510 Atherosclerotic heart disease of native coronary artery without angina pectoris: Secondary | ICD-10-CM | POA: Insufficient documentation

## 2013-06-27 DIAGNOSIS — Z8679 Personal history of other diseases of the circulatory system: Secondary | ICD-10-CM | POA: Insufficient documentation

## 2013-06-27 LAB — POCT I-STAT, CHEM 8
BUN: 15 mg/dL (ref 6–23)
Calcium, Ion: 1.22 mmol/L (ref 1.12–1.23)
Chloride: 104 mEq/L (ref 96–112)
Creatinine, Ser: 1 mg/dL (ref 0.50–1.35)
Glucose, Bld: 89 mg/dL (ref 70–99)
TCO2: 26 mmol/L (ref 0–100)

## 2013-06-27 LAB — POCT I-STAT TROPONIN I: Troponin i, poc: 0 ng/mL (ref 0.00–0.08)

## 2013-06-27 MED ORDER — OMEPRAZOLE 40 MG PO CPDR: 40.0000 mg | DELAYED_RELEASE_CAPSULE | Freq: Every day | ORAL | Status: DC

## 2013-06-27 NOTE — ED Notes (Addendum)
Pt states that when he swallows, it "feels like it goes down slow".  States that his "pathways" are being blocked.  Denies NVD.  Has been going on x 1 wk.

## 2013-06-27 NOTE — ED Provider Notes (Signed)
History   CSN: [id number] Arrival date & time 06/27/13  1202  First MD Initiated Contact with Patient 06/27/13 1242     Chief Complaint  Patient presents with  . "when I swallow, I think it goes down slow"    HPI Patient presents to the emergency room with complaints of discomfort with swallowing. The symptoms started about a week ago. Patient has noted discomfort when he tries to swallow his saliva or food. He never has any pain or discomfort other than when he is trying to swallow something.  He has not had any issues with being unable to swallow food. He has not had any regurgitation.  Patient became concerned and wanted to get it checked out because he does have a history of myocardial infarction. The symptoms he is experiencing now are very different from his heart disease trouble. He denies any shortness of breath. He denies any chest pain. He denies any diaphoresis. He denies any numbness or weakness.  Patient also denies abdominal pain or fever. Past Medical History  Diagnosis Date  . Chest pain 2010  . Hyperlipidemia   . Coronary artery disease     SPONTANEOUS DISSECTION AND PLAQUE RUPTURE OF HIS LEFT ANTERIOR DESCENDING ARTERY  . MI (myocardial infarction)    Past Surgical History  Procedure Laterality Date  . Cardiac catheterization  06/24/2009    EF 60%  . Cardiac catheterization  06/17/2009    EF 45%. ANTERIOR HYPOKINESIS  . US echocardiography  12/09/2009    EF 55-60%   Family History  Problem Relation Age of Onset  . Diabetes Mother   . Liver cancer Father    History  Substance Use Topics  . Smoking status: Current Some Day Smoker  . Smokeless tobacco: Not on file  . Alcohol Use: Yes     Comment: occassional    Review of Systems  All other systems reviewed and are negative.    Allergies  Review of patient's allergies indicates no known allergies.  Home Medications   Current Outpatient Rx  Name  Route  Sig  Dispense  Refill  . ibuprofen  (ADVIL,MOTRIN) 200 MG tablet   Oral   Take 200 mg by mouth every 6 (six) hours as needed for pain.         Marland Kitchen aspirin 81 MG tablet   Oral   Take 81 mg by mouth as needed. For blood thinner.         . naproxen (NAPROSYN) 500 MG tablet   Oral   Take 1 tablet (500 mg total) by mouth 2 (two) times daily.   20 tablet   0   . naproxen sodium (ANAPROX) 220 MG tablet   Oral   Take 220 mg by mouth 2 (two) times daily as needed. Throat pain         . omeprazole (PRILOSEC) 40 MG capsule   Oral   Take 1 capsule (40 mg total) by mouth daily.   14 capsule   1   . penicillin v potassium (VEETID) 500 MG tablet   Oral   Take 1 tablet (500 mg total) by mouth 3 (three) times daily.   21 tablet   0    BP 130/75  Pulse 91  Temp(Src) 98.8 F (37.1 C) (Oral)  Resp 18  Ht 5\' 4"  (1.626 m)  Wt 141 lb 2 oz (64.014 kg)  BMI 24.21 kg/m2  SpO2 100% Physical Exam  Nursing note and vitals reviewed. Constitutional: He appears well-developed and  well-nourished. No distress.  HENT:  Head: Normocephalic and atraumatic.  Right Ear: External ear normal.  Left Ear: External ear normal.  Eyes: Conjunctivae are normal. Right eye exhibits no discharge. Left eye exhibits no discharge. No scleral icterus.  Neck: Neck supple. No tracheal deviation present.  Cardiovascular: Normal rate, regular rhythm and intact distal pulses.   Pulmonary/Chest: Effort normal and breath sounds normal. No stridor. No respiratory distress. He has no wheezes. He has no rales.  Abdominal: Soft. Bowel sounds are normal. He exhibits no distension. There is no tenderness. There is no rebound and no guarding.  Musculoskeletal: He exhibits no edema and no tenderness.  Neurological: He is alert. He has normal strength. No sensory deficit. Cranial nerve deficit:  no gross defecits noted. He exhibits normal muscle tone. He displays no seizure activity. Coordination normal.  Skin: Skin is warm and dry. No rash noted.  Psychiatric:  He has a normal mood and affect.    ED Course  Procedures (including critical care time) EKG Normal sinus rhythm Rate 76 Normal axis, normal intervals Nonspecific ST-T wave changes in the anterior leads These are new when compared to EKG dated 02/19/2010  Labs Reviewed  POCT I-STAT, CHEM 8 - Abnormal; Notable for the following:    Hemoglobin 11.9 (*)    HCT 35.0 (*)    All other components within normal limits  POCT I-STAT TROPONIN I   No results found. 1. GERD (gastroesophageal reflux disease)     MDM  Symptoms atypical for ACS.   Mild EKG changes however symptoms ongoing for one week and normal enzymes.Suspect GERD.  Will dc home on po medications.   Celene Kras, MD 06/27/13 [phone]

## 2013-06-27 NOTE — Progress Notes (Signed)
[facility] has seen patient and provided him with a list of primary care resources, as well as, a oc application. °

## 2013-10-30 ENCOUNTER — Encounter (HOSPITAL_COMMUNITY): Payer: Self-pay | Admitting: Emergency Medicine

## 2013-10-30 ENCOUNTER — Emergency Department (HOSPITAL_COMMUNITY)
Admission: EM | Admit: 2013-10-30 | Discharge: 2013-10-30 | Disposition: A | Discharge status: Home / Self Care | Payer: No Typology Code available for payment source | Attending: Emergency Medicine | Admitting: Emergency Medicine

## 2013-10-30 DIAGNOSIS — Z79899 Other long term (current) drug therapy: Secondary | ICD-10-CM | POA: Insufficient documentation

## 2013-10-30 DIAGNOSIS — Z7982 Long term (current) use of aspirin: Secondary | ICD-10-CM | POA: Insufficient documentation

## 2013-10-30 DIAGNOSIS — Z862 Personal history of diseases of the blood and blood-forming organs and certain disorders involving the immune mechanism: Secondary | ICD-10-CM | POA: Insufficient documentation

## 2013-10-30 DIAGNOSIS — H9209 Otalgia, unspecified ear: Secondary | ICD-10-CM | POA: Insufficient documentation

## 2013-10-30 DIAGNOSIS — R12 Heartburn: Secondary | ICD-10-CM | POA: Insufficient documentation

## 2013-10-30 DIAGNOSIS — Z791 Long term (current) use of non-steroidal anti-inflammatories (NSAID): Secondary | ICD-10-CM | POA: Insufficient documentation

## 2013-10-30 DIAGNOSIS — Z8639 Personal history of other endocrine, nutritional and metabolic disease: Secondary | ICD-10-CM | POA: Insufficient documentation

## 2013-10-30 DIAGNOSIS — I2510 Atherosclerotic heart disease of native coronary artery without angina pectoris: Secondary | ICD-10-CM | POA: Insufficient documentation

## 2013-10-30 DIAGNOSIS — I252 Old myocardial infarction: Secondary | ICD-10-CM | POA: Insufficient documentation

## 2013-10-30 DIAGNOSIS — K0889 Other specified disorders of teeth and supporting structures: Secondary | ICD-10-CM

## 2013-10-30 DIAGNOSIS — K089 Disorder of teeth and supporting structures, unspecified: Secondary | ICD-10-CM | POA: Insufficient documentation

## 2013-10-30 DIAGNOSIS — F17200 Nicotine dependence, unspecified, uncomplicated: Secondary | ICD-10-CM | POA: Insufficient documentation

## 2013-10-30 DIAGNOSIS — R220 Localized swelling, mass and lump, head: Secondary | ICD-10-CM | POA: Insufficient documentation

## 2013-10-30 DIAGNOSIS — R112 Nausea with vomiting, unspecified: Secondary | ICD-10-CM | POA: Insufficient documentation

## 2013-10-30 DIAGNOSIS — L259 Unspecified contact dermatitis, unspecified cause: Secondary | ICD-10-CM | POA: Insufficient documentation

## 2013-10-30 DIAGNOSIS — K029 Dental caries, unspecified: Secondary | ICD-10-CM | POA: Insufficient documentation

## 2013-10-30 DIAGNOSIS — Z9861 Coronary angioplasty status: Secondary | ICD-10-CM | POA: Insufficient documentation

## 2013-10-30 MED ORDER — OXYCODONE-ACETAMINOPHEN 5-325 MG PO TABS: 1.0000 | ORAL_TABLET | ORAL | Status: DC | PRN

## 2013-10-30 MED ORDER — AMOXICILLIN 500 MG PO CAPS: 500.0000 mg | ORAL_CAPSULE | Freq: Three times a day (TID) | ORAL | Status: DC

## 2013-10-30 NOTE — ED Provider Notes (Signed)
CSN: [id number]     Arrival date & time 10/30/13  9147 History  This chart was scribed for Arthor Captain, PA working with Audree Camel, MD by Quintella Reichert, ED Scribe. This patient was seen in room TR07C/TR07C and the patient's care was started at 10:19 AM.   Chief Complaint  Patient presents with  . dental abscess     The history is provided by the patient. No language interpreter was used.    HPI Comments: Rodney Walker is a 32 y.o. male who presents to the Emergency Department complaining of 2-3 months of intermittent left lower dental pain.  Pt has not been seen by a dentist because he only recently got insurance.  He is here now seeking pain relief and referral for PCP and dental f/u.  Pain is worsened with cold air and cold food.  He has not taken any pain medications pta.  Pt also states he also has an abscess in the area which he felt draining pta.  He denies fevers, chills, or difficulty swallowing.  Pt is a current every-day smoker.  Pt also complains of GERD symptoms.  He states he feels his food coming up with associated burning pain.  Symptoms are worse after eating and when lying down.  He took Prilosec for 4 days but he became nauseated and he felt like something may be stuck in his esophagus, so he stopped taking it out of concern that this may have been a reaction to the omeprazole.  He has 2 more Prilosec capsules at home and is wondering whether it is advisable to take these.  Pt also states he has run out of the ointment he uses for his eczema and it has been worsening with the weather change.   Past Medical History  Diagnosis Date  . Chest pain 2010  . Hyperlipidemia   . Coronary artery disease     SPONTANEOUS DISSECTION AND PLAQUE RUPTURE OF HIS LEFT ANTERIOR DESCENDING ARTERY  . MI (myocardial infarction)     Past Surgical History  Procedure Laterality Date  . Cardiac catheterization  06/24/2009    EF 60%  . Cardiac catheterization  06/17/2009    EF  45%. ANTERIOR HYPOKINESIS  . US echocardiography  12/09/2009    EF 55-60%    Family History  Problem Relation Age of Onset  . Diabetes Mother   . Liver cancer Father     History  Substance Use Topics  . Smoking status: Current Every Day Smoker    Types: Cigarettes  . Smokeless tobacco: Not on file  . Alcohol Use: Yes     Comment: occassional     Review of Systems  Constitutional: Negative for fever and chills.  HENT: Positive for dental problem (pain, pt feels he has a draining abscess), ear pain and facial swelling. Negative for sore throat, trouble swallowing and voice change.        Regurgitation, heartburn  Eyes: Negative for visual disturbance.  Respiratory: Negative for stridor.   Gastrointestinal: Negative for nausea.  Neurological: Negative for headaches.     Allergies  Review of patient's allergies indicates no known allergies.  Home Medications   Current Outpatient Rx  Name  Route  Sig  Dispense  Refill  . aspirin 81 MG tablet   Oral   Take 81 mg by mouth as needed. For blood thinner.         Marland Kitchen ibuprofen (ADVIL,MOTRIN) 200 MG tablet   Oral   Take 200 mg by  mouth every 6 (six) hours as needed for pain.         . naproxen (NAPROSYN) 500 MG tablet   Oral   Take 1 tablet (500 mg total) by mouth 2 (two) times daily.   20 tablet   0   . naproxen sodium (ANAPROX) 220 MG tablet   Oral   Take 220 mg by mouth 2 (two) times daily as needed. Throat pain         . omeprazole (PRILOSEC) 40 MG capsule   Oral   Take 1 capsule (40 mg total) by mouth daily.   14 capsule   1   . penicillin v potassium (VEETID) 500 MG tablet   Oral   Take 1 tablet (500 mg total) by mouth 3 (three) times daily.   21 tablet   0    BP 111/67  Pulse 79  Temp(Src) 97.8 F (36.6 C) (Oral)  Resp 20  Ht 5\' 4"  (1.626 m)  Wt 141 lb (63.957 kg)  BMI 24.19 kg/m2  SpO2 100%  Physical Exam  Nursing note and vitals reviewed. Constitutional: He is oriented to person,  place, and time. He appears well-developed and well-nourished. No distress.  HENT:  Head: Normocephalic and atraumatic.  Left lower premolar decayed, exposed pulp and dentin.  Just before it there is an area of gingiva that appears to be a drained periapical abscess.  There is no drainage currently.  Eyes: Conjunctivae and EOM are normal.  Neck: Neck supple. No tracheal deviation present.  Cardiovascular: Normal rate.   Pulmonary/Chest: Effort normal. No respiratory distress.  Musculoskeletal: Normal range of motion.  Lymphadenopathy:    He has no cervical adenopathy.  Neurological: He is alert and oriented to person, place, and time.  Skin: Skin is warm and dry.  Psychiatric: He has a normal mood and affect. His behavior is normal.    ED Course  Procedures (including critical care time)  DIAGNOSTIC STUDIES: Oxygen Saturation is 100% on room air, normal by my interpretation.    COORDINATION OF CARE: 10:25 AM-Discussed treatment plan which includes pain medication, antibiotics, OTC hydrocortisone cream for eczema, and referral for PCP and dental f/u with pt at bedside and pt agreed to plan.    Labs Review Labs Reviewed - No data to display  Imaging Review No results found.  EKG Interpretation   None       MDM   1. Pain, dental    Pt afebrile, no signs of Ludwig's angina or pharyngeal or tonsillar abscess.  Previous periapical abscess open, no apparent drainage.  Appears safe to discharge for follow up with dental.  Pt will also have GI referral for his gastric complaints.  Advised OTC medications for eczema and discharge with PCP referral.   I personally performed the services described in this documentation, which was scribed in my presence. The recorded information has been reviewed and is accurate.     Arthor Captain, PA-C 11/03/13 1122

## 2013-10-30 NOTE — Progress Notes (Signed)
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 Patient reports he does not have a PCP. Education  Provided to patient on the Importance of establishing a PCP.Patient provided with a resource sheet for the [facility] / Urgent Care. Patient  Receptive to resources.Case Manager explained with patient consent she can e-mail the [facility] and help with his appointment.Patient provided his consent for  This Clinical research associate to e-mail the clinic.No further Case Manager needs identified.

## 2013-10-30 NOTE — ED Notes (Signed)
Patient states dental abscess L lower gum.   Patient states he also wanted to speak with someone about GERD.   Patient states no doctor/no insurance.

## 2013-11-03 NOTE — ED Provider Notes (Signed)
Medical screening examination/treatment/procedure(s) were performed by non-physician practitioner and as supervising physician I was immediately available for consultation/collaboration.  EKG Interpretation   None         Jossilyn Benda T Rex Oesterle, MD 11/03/13 1619 

## 2013-11-14 ENCOUNTER — Ambulatory Visit: Payer: Self-pay

## 2014-01-31 ENCOUNTER — Emergency Department (HOSPITAL_COMMUNITY)
Admission: EM | Admit: 2014-01-31 | Discharge: 2014-01-31 | Disposition: A | Discharge status: Home / Self Care | Payer: No Typology Code available for payment source | Attending: Emergency Medicine | Admitting: Emergency Medicine

## 2014-01-31 ENCOUNTER — Emergency Department (HOSPITAL_COMMUNITY): Payer: No Typology Code available for payment source

## 2014-01-31 ENCOUNTER — Encounter (HOSPITAL_COMMUNITY): Payer: Self-pay | Admitting: Emergency Medicine

## 2014-01-31 DIAGNOSIS — M25461 Effusion, right knee: Secondary | ICD-10-CM

## 2014-01-31 DIAGNOSIS — Z9889 Other specified postprocedural states: Secondary | ICD-10-CM | POA: Insufficient documentation

## 2014-01-31 DIAGNOSIS — Z791 Long term (current) use of non-steroidal anti-inflammatories (NSAID): Secondary | ICD-10-CM | POA: Insufficient documentation

## 2014-01-31 DIAGNOSIS — F17200 Nicotine dependence, unspecified, uncomplicated: Secondary | ICD-10-CM | POA: Insufficient documentation

## 2014-01-31 DIAGNOSIS — I252 Old myocardial infarction: Secondary | ICD-10-CM | POA: Insufficient documentation

## 2014-01-31 DIAGNOSIS — I2510 Atherosclerotic heart disease of native coronary artery without angina pectoris: Secondary | ICD-10-CM | POA: Insufficient documentation

## 2014-01-31 DIAGNOSIS — M25469 Effusion, unspecified knee: Secondary | ICD-10-CM | POA: Insufficient documentation

## 2014-01-31 DIAGNOSIS — Z8639 Personal history of other endocrine, nutritional and metabolic disease: Secondary | ICD-10-CM | POA: Insufficient documentation

## 2014-01-31 DIAGNOSIS — Z862 Personal history of diseases of the blood and blood-forming organs and certain disorders involving the immune mechanism: Secondary | ICD-10-CM | POA: Insufficient documentation

## 2014-01-31 MED ORDER — ONDANSETRON 4 MG PO TBDP
4.0000 mg | ORAL_TABLET | Freq: Once | ORAL | Status: AC
  Administered 2014-01-31: 4 mg via ORAL
  Filled 2014-01-31: qty 1

## 2014-01-31 MED ORDER — NAPROXEN 500 MG PO TABS: 500.0000 mg | ORAL_TABLET | Freq: Two times a day (BID) | ORAL | Status: DC

## 2014-01-31 MED ORDER — HYDROCODONE-ACETAMINOPHEN 5-325 MG PO TABS: 1.0000 | ORAL_TABLET | Freq: Four times a day (QID) | ORAL | Status: DC | PRN

## 2014-01-31 MED ORDER — OXYCODONE-ACETAMINOPHEN 5-325 MG PO TABS
2.0000 | ORAL_TABLET | Freq: Once | ORAL | Status: AC
  Administered 2014-01-31: 2 via ORAL
  Filled 2014-01-31: qty 2

## 2014-01-31 NOTE — ED Provider Notes (Signed)
CSN: [id number]     Arrival date & time 01/31/14  1939 History   First MD Initiated Contact with Patient 01/31/14 2014     Chief Complaint  Patient presents with  . Knee Pain   (Consider location/radiation/quality/duration/timing/severity/associated sxs/prior Treatment) The history is provided by the patient. No language interpreter was used.   This is a 33 year old male with a past medical history of coronary artery disease, previous MRI from spontaneous dissection and plaque rupture of his left LAD presents emergency Department with chief complaint of right knee pain.  Patient complains of waking up 2 days ago with pain in his knee.  He states it is worse with flexion and, walking.  He has noticed some swelling.  He denies any heat, redness, fevers, chills, rashes, she is.  The patient denies any injury to the knee or previous history of knee problems or pain.  Patient states his pain is 8/10.  Unrelieved at home with rest, ice, elevation, anti-inflammatories.  Patient denies any clicking, popping, locking, instability.  He states his knee feels very swollen.  Past Medical History  Diagnosis Date  . Chest pain 2010  . Hyperlipidemia   . Coronary artery disease     SPONTANEOUS DISSECTION AND PLAQUE RUPTURE OF HIS LEFT ANTERIOR DESCENDING ARTERY  . MI (myocardial infarction)    Past Surgical History  Procedure Laterality Date  . Cardiac catheterization  06/24/2009    EF 60%  . Cardiac catheterization  06/17/2009    EF 45%. ANTERIOR HYPOKINESIS  . US echocardiography  12/09/2009    EF 55-60%   Family History  Problem Relation Age of Onset  . Diabetes Mother   . Liver cancer Father    History  Substance Use Topics  . Smoking status: Current Some Day Smoker    Types: Cigarettes  . Smokeless tobacco: Not on file  . Alcohol Use: Yes     Comment: occassional    Review of Systems Ten systems reviewed and are negative for acute change, except as noted in the HPI.   Allergies   Review of patient's allergies indicates no known allergies.  Home Medications   Current Outpatient Rx  Name  Route  Sig  Dispense  Refill  . HYDROcodone-acetaminophen (NORCO) 5-325 MG per tablet   Oral   Take 1-2 tablets by mouth every 6 (six) hours as needed for moderate pain.   20 tablet   0   . naproxen (NAPROSYN) 500 MG tablet   Oral   Take 1 tablet (500 mg total) by mouth 2 (two) times daily with a meal.   30 tablet   0    BP 130/80  Pulse 89  Temp(Src) 98.5 F (36.9 C) (Oral)  Resp 17  SpO2 99% Physical Exam Physical Exam  Nursing note and vitals reviewed. Constitutional: He appears well-developed and well-nourished. No distress.  HENT:  Head: Normocephalic and atraumatic.  Eyes: Conjunctivae normal are normal. No scleral icterus.  Neck: Normal range of motion. Neck supple.  Cardiovascular: Normal rate, regular rhythm and normal heart sounds.   Pulmonary/Chest: Effort normal and breath sounds normal. No respiratory distress.  Abdominal: Soft. There is no tenderness.  Musculoskeletal: Right knee swelling, point tenderness over the patella and suprapatellar regions.  No crepitus, physician decrease to 2 swelling and pain.  Neurovascularly intact.  Neurological: He is alert.  Skin: Skin is warm and dry. He is not diaphoretic.  Psychiatric: His behavior is normal.    ED Course  ARTHOCENTESIS Date/Time: 02/02/2014 10:15  AM Performed by: Arthor CaptainHARRIS, Jecenia Leamer Authorized by: Arthor CaptainHARRIS, Adelaide Pfefferkorn Consent: Verbal consent obtained. Risks and benefits: risks, benefits and alternatives were discussed Consent given by: patient Patient identity confirmed: verbally with patient Indications: joint swelling  Body area: knee Joint: right knee Local anesthesia used: yes Local anesthetic: lidocaine 2% with epinephrine Anesthetic total: 6 ml Patient sedated: no Needle gauge: 18 G Approach: lateral (inferolateral) Aspirate characteristics: Dry tap. Aspirate amount: 0 ml Patient  tolerance: Patient tolerated the procedure well with no immediate complications.   (including critical care time) Labs Review Labs Reviewed - No data to display Imaging Review Dg Knee Complete 4 Views Right  01/31/2014   CLINICAL DATA:  Pain without trauma  EXAM: RIGHT KNEE - COMPLETE 4+ VIEW  COMPARISON:  None.  FINDINGS: Effusion in the suprapatellar bursa. Negative for fracture, dislocation, or other acute bone abnormality. Normal mineralization and alignment. No significant osseous degenerative change.  IMPRESSION: 1. Effusion without bone abnormality.   Electronically Signed   By: Oley Balmaniel  Hassell M.D.   On: 01/31/2014 20:41    EKG Interpretation   None       MDM   1. Knee effusion, right    Patient with knee effusion. Therapeutic arthrocentesis unsuccessful. Patient will be discharged with antiinflammatories, crutches, pain meds and follow up with orthopedics.  The concern for septic joint or gout. The patient appears reasonably screened and/or stabilized for discharge and I doubt any other medical condition or other [facility] requiring further screening, evaluation, or treatment in the ED at this time prior to discharge.    Arthor CaptainAbigail Shalese Strahan, PA-C 02/02/14 1019

## 2014-01-31 NOTE — ED Notes (Signed)
Pt comfortable with d/c and f/u instructions. Prescriptions x2. 

## 2014-01-31 NOTE — Discharge Instructions (Signed)
Do not drive, operate heavy machinery, drink alcohol, or take other tylenol containing products with this medicine.  Knee Effusion The medical term for having fluid in your knee is effusion. This is often due to an internal derangement of the knee. This means something is wrong inside the knee. Some of the causes of fluid in the knee may be torn cartilage, a torn ligament, or bleeding into the joint from an injury. Your knee is likely more difficult to bend and move. This is often because there is increased pain and pressure in the joint. The time it takes for recovery from a knee effusion depends on different factors, including:   Type of injury.  Your age.  Physical and medical conditions.  Rehabilitation Strategies. How long you will be away from your normal activities will depend on what kind of knee problem you have and how much damage is present. Your knee has two types of cartilage. Articular cartilage covers the bone ends and lets your knee bend and move smoothly. Two menisci, thick pads of cartilage that form a rim inside the joint, help absorb shock and stabilize your knee. Ligaments bind the bones together and support your knee joint. Muscles move the joint, help support your knee, and take stress off the joint itself. CAUSES  Often an effusion in the knee is caused by an injury to one of the menisci. This is often a tear in the cartilage. Recovery after a meniscus injury depends on how much meniscus is damaged and whether you have damaged other knee tissue. Small tears may heal on their own with conservative treatment. Conservative means rest, limited weight bearing activity and muscle strengthening exercises. Your recovery may take up to 6 weeks.  TREATMENT  Larger tears may require surgery. Meniscus injuries may be treated during arthroscopy. Arthroscopy is a procedure in which your surgeon uses a small telescope like instrument to look in your knee. Your caregiver can make a more  accurate diagnosis (learning what is wrong) by performing an arthroscopic procedure. If your injury is on the inner margin of the meniscus, your surgeon may trim the meniscus back to a smooth rim. In other cases your surgeon will try to repair a damaged meniscus with stitches (sutures). This may make rehabilitation take longer, but may provide better long term result by helping your knee keep its shock absorption capabilities. Ligaments which are completely torn usually require surgery for repair. HOME CARE INSTRUCTIONS  Use crutches as instructed.  If a brace is applied, use as directed.  Once you are home, an ice pack applied to your swollen knee may help with discomfort and help decrease swelling.  Keep your knee raised (elevated) when you are not up and around or on crutches.  Only take over-the-counter or prescription medicines for pain, discomfort, or fever as directed by your caregiver.  Your caregivers will help with instructions for rehabilitation of your knee. This often includes strengthening exercises.  You may resume a normal diet and activities as directed. SEEK MEDICAL CARE IF:   There is increased swelling in your knee.  You notice redness, swelling, or increasing pain in your knee.  An unexplained oral temperature above 102 F (38.9 C) develops. SEEK IMMEDIATE MEDICAL CARE IF:   You develop a rash.  You have difficulty breathing.  You have any allergic reactions from medications you may have been given.  There is severe pain with any motion of the knee. MAKE SURE YOU:   Understand these instructions.  Will  watch your condition.  Will get help right away if you are not doing well or get worse. Document Released: 03/05/2004 Document Revised: 03/07/2012 Document Reviewed: 05/09/2008 [facility] Patient Information 2014 [place], [place].

## 2014-01-31 NOTE — ED Notes (Signed)
Pt reports 2 days ago, woke up with R knee pain; not sure if injured it; pt reports having difficulty walking since; pt ambulatory with limp in triage

## 2014-02-05 NOTE — ED Provider Notes (Signed)
Medical screening examination/treatment/procedure(s) were performed by non-physician practitioner and as supervising physician I was immediately available for consultation/collaboration.  EKG Interpretation   None        Juliet Rude. Rubin Payor, MD 02/05/14 2044

## 2014-03-18 ENCOUNTER — Emergency Department (HOSPITAL_COMMUNITY): Payer: No Typology Code available for payment source

## 2014-03-18 ENCOUNTER — Emergency Department (HOSPITAL_COMMUNITY)
Admission: EM | Admit: 2014-03-18 | Discharge: 2014-03-18 | Disposition: A | Discharge status: Home / Self Care | Payer: No Typology Code available for payment source | Attending: Emergency Medicine | Admitting: Emergency Medicine

## 2014-03-18 ENCOUNTER — Encounter (HOSPITAL_COMMUNITY): Payer: Self-pay | Admitting: Emergency Medicine

## 2014-03-18 ENCOUNTER — Telehealth: Payer: Self-pay | Admitting: Infectious Disease

## 2014-03-18 DIAGNOSIS — M2550 Pain in unspecified joint: Secondary | ICD-10-CM | POA: Insufficient documentation

## 2014-03-18 DIAGNOSIS — R05 Cough: Secondary | ICD-10-CM

## 2014-03-18 DIAGNOSIS — J3489 Other specified disorders of nose and nasal sinuses: Secondary | ICD-10-CM | POA: Insufficient documentation

## 2014-03-18 DIAGNOSIS — R0681 Apnea, not elsewhere classified: Secondary | ICD-10-CM | POA: Insufficient documentation

## 2014-03-18 DIAGNOSIS — K1370 Unspecified lesions of oral mucosa: Secondary | ICD-10-CM | POA: Insufficient documentation

## 2014-03-18 DIAGNOSIS — D649 Anemia, unspecified: Secondary | ICD-10-CM

## 2014-03-18 DIAGNOSIS — I252 Old myocardial infarction: Secondary | ICD-10-CM | POA: Insufficient documentation

## 2014-03-18 DIAGNOSIS — J439 Emphysema, unspecified: Secondary | ICD-10-CM | POA: Insufficient documentation

## 2014-03-18 DIAGNOSIS — R0609 Other forms of dyspnea: Secondary | ICD-10-CM | POA: Insufficient documentation

## 2014-03-18 DIAGNOSIS — Z8639 Personal history of other endocrine, nutritional and metabolic disease: Secondary | ICD-10-CM | POA: Insufficient documentation

## 2014-03-18 DIAGNOSIS — Z862 Personal history of diseases of the blood and blood-forming organs and certain disorders involving the immune mechanism: Secondary | ICD-10-CM | POA: Insufficient documentation

## 2014-03-18 DIAGNOSIS — F17200 Nicotine dependence, unspecified, uncomplicated: Secondary | ICD-10-CM | POA: Insufficient documentation

## 2014-03-18 DIAGNOSIS — Z9889 Other specified postprocedural states: Secondary | ICD-10-CM | POA: Insufficient documentation

## 2014-03-18 DIAGNOSIS — R059 Cough, unspecified: Secondary | ICD-10-CM | POA: Insufficient documentation

## 2014-03-18 DIAGNOSIS — I2510 Atherosclerotic heart disease of native coronary artery without angina pectoris: Secondary | ICD-10-CM | POA: Insufficient documentation

## 2014-03-18 DIAGNOSIS — R0982 Postnasal drip: Secondary | ICD-10-CM | POA: Insufficient documentation

## 2014-03-18 DIAGNOSIS — R0989 Other specified symptoms and signs involving the circulatory and respiratory systems: Principal | ICD-10-CM | POA: Insufficient documentation

## 2014-03-18 DIAGNOSIS — K219 Gastro-esophageal reflux disease without esophagitis: Secondary | ICD-10-CM | POA: Insufficient documentation

## 2014-03-18 LAB — COMPREHENSIVE METABOLIC PANEL
ALT: 11 U/L (ref 0–53)
AST: 22 U/L (ref 0–37)
Albumin: 2.5 g/dL — ABNORMAL LOW (ref 3.5–5.2)
Alkaline Phosphatase: 70 U/L (ref 39–117)
BILIRUBIN TOTAL: 0.3 mg/dL (ref 0.3–1.2)
BUN: 10 mg/dL (ref 6–23)
CALCIUM: 8.5 mg/dL (ref 8.4–10.5)
CHLORIDE: 100 meq/L (ref 96–112)
CO2: 26 meq/L (ref 19–32)
CREATININE: 0.94 mg/dL (ref 0.50–1.35)
GLUCOSE: 87 mg/dL (ref 70–99)
Potassium: 3.5 mEq/L — ABNORMAL LOW (ref 3.7–5.3)
Sodium: 137 mEq/L (ref 137–147)
Total Protein: 7.1 g/dL (ref 6.0–8.3)

## 2014-03-18 LAB — CBC WITH DIFFERENTIAL/PLATELET
BASOS ABS: 0 10*3/uL (ref 0.0–0.1)
Basophils Relative: 0 % (ref 0–1)
EOS PCT: 3 % (ref 0–5)
Eosinophils Absolute: 0.1 10*3/uL (ref 0.0–0.7)
HEMATOCRIT: 26.1 % — AB (ref 39.0–52.0)
HEMOGLOBIN: 9.6 g/dL — AB (ref 13.0–17.0)
LYMPHS PCT: 13 % (ref 12–46)
Lymphs Abs: 0.5 10*3/uL — ABNORMAL LOW (ref 0.7–4.0)
MCH: 30.7 pg (ref 26.0–34.0)
MCHC: 36.8 g/dL — ABNORMAL HIGH (ref 30.0–36.0)
MCV: 83.4 fL (ref 78.0–100.0)
MONO ABS: 0.3 10*3/uL (ref 0.1–1.0)
MONOS PCT: 7 % (ref 3–12)
Neutro Abs: 3.3 10*3/uL (ref 1.7–7.7)
Neutrophils Relative %: 77 % (ref 43–77)
Platelets: 203 10*3/uL (ref 150–400)
RBC: 3.13 MIL/uL — ABNORMAL LOW (ref 4.22–5.81)
RDW: 13.4 % (ref 11.5–15.5)
WBC: 4.3 10*3/uL (ref 4.0–10.5)

## 2014-03-18 LAB — TROPONIN I

## 2014-03-18 LAB — RAPID HIV SCREEN (WH-MAU): Rapid HIV Screen: REACTIVE — AB

## 2014-03-18 LAB — PRO B NATRIURETIC PEPTIDE: PRO B NATRI PEPTIDE: 60.8 pg/mL (ref 0–125)

## 2014-03-18 MED ORDER — ALBUTEROL SULFATE HFA 108 (90 BASE) MCG/ACT IN AERS
2.0000 | INHALATION_SPRAY | Freq: Once | RESPIRATORY_TRACT | Status: AC
  Administered 2014-03-18: 2 via RESPIRATORY_TRACT
  Filled 2014-03-18: qty 6.7

## 2014-03-18 MED ORDER — POTASSIUM CHLORIDE CRYS ER 20 MEQ PO TBCR
40.0000 meq | EXTENDED_RELEASE_TABLET | Freq: Once | ORAL | Status: AC
  Administered 2014-03-18: 40 meq via ORAL
  Filled 2014-03-18: qty 2

## 2014-03-18 MED ORDER — AZITHROMYCIN 250 MG PO TABS: 250.0000 mg | ORAL_TABLET | Freq: Every day | ORAL | Status: DC

## 2014-03-18 MED ORDER — IOHEXOL 350 MG/ML SOLN
50.0000 mL | Freq: Once | INTRAVENOUS | Status: AC | PRN
  Administered 2014-03-18: 50 mL via INTRAVENOUS

## 2014-03-18 NOTE — Discharge Instructions (Signed)
Read the information below.  Use the prescribed medication as directed.  Please discuss all new medications with your pharmacist.  You may return to the Emergency Department at any time for worsening condition or any new symptoms that concern you.  If you develop chest pain, worsening shortness of breath, fever, you pass out, or become weak or dizzy, return to the ER for a recheck.      Cough, Adult  A cough is a reflex that helps clear your throat and airways. It can help heal the body or may be a reaction to an irritated airway. A cough may only last 2 or 3 weeks (acute) or may last more than 8 weeks (chronic).  CAUSES Acute cough:  Viral or bacterial infections. Chronic cough:  Infections.  Allergies.  Asthma.  Post-nasal drip.  Smoking.  Heartburn or acid reflux.  Some medicines.  Chronic lung problems (COPD).  Cancer. SYMPTOMS   Cough.  Fever.  Chest pain.  Increased breathing rate.  High-pitched whistling sound when breathing (wheezing).  Colored mucus that you cough up (sputum). TREATMENT   A bacterial cough may be treated with antibiotic medicine.  A viral cough must run its course and will not respond to antibiotics.  Your caregiver may recommend other treatments if you have a chronic cough. HOME CARE INSTRUCTIONS   Only take over-the-counter or prescription medicines for pain, discomfort, or fever as directed by your caregiver. Use cough suppressants only as directed by your caregiver.  Use a cold steam vaporizer or humidifier in your bedroom or home to help loosen secretions.  Sleep in a semi-upright position if your cough is worse at night.  Rest as needed.  Stop smoking if you smoke. SEEK IMMEDIATE MEDICAL CARE IF:   You have pus in your sputum.  Your cough starts to worsen.  You cannot control your cough with suppressants and are losing sleep.  You begin coughing up blood.  You have difficulty breathing.  You develop pain which is  getting worse or is uncontrolled with medicine.  You have a fever. MAKE SURE YOU:   Understand these instructions.  Will watch your condition.  Will get help right away if you are not doing well or get worse. Document Released: 06/12/2011 Document Revised: 03/07/2012 Document Reviewed: 06/12/2011 [facility] Patient Information 2014 [place], [place].  Gastroesophageal Reflux Disease, Adult Gastroesophageal reflux disease (GERD) happens when acid from your stomach flows up into the esophagus. When acid comes in contact with the esophagus, the acid causes soreness (inflammation) in the esophagus. Over time, GERD may create small holes (ulcers) in the lining of the esophagus. CAUSES   Increased body weight. This puts pressure on the stomach, making acid rise from the stomach into the esophagus.  Smoking. This increases acid production in the stomach.  Drinking alcohol. This causes decreased pressure in the lower esophageal sphincter (valve or ring of muscle between the esophagus and stomach), allowing acid from the stomach into the esophagus.  Late evening meals and a full stomach. This increases pressure and acid production in the stomach.  A malformed lower esophageal sphincter. Sometimes, no cause is found. SYMPTOMS   Burning pain in the lower part of the mid-chest behind the breastbone and in the mid-stomach area. This may occur twice a week or more often.  Trouble swallowing.  Sore throat.  Dry cough.  Asthma-like symptoms including chest tightness, shortness of breath, or wheezing. DIAGNOSIS  Your caregiver may be able to diagnose GERD based on your symptoms. In  some cases, X-rays and other tests may be done to check for complications or to check the condition of your stomach and esophagus. TREATMENT  Your caregiver may recommend over-the-counter or prescription medicines to help decrease acid production. Ask your caregiver before starting or adding any new medicines.  HOME  CARE INSTRUCTIONS   Change the factors that you can control. Ask your caregiver for guidance concerning weight loss, quitting smoking, and alcohol consumption.  Avoid foods and drinks that make your symptoms worse, such as:  Caffeine or alcoholic drinks.  Chocolate.  Peppermint or mint flavorings.  Garlic and onions.  Spicy foods.  Citrus fruits, such as oranges, lemons, or limes.  Tomato-based foods such as sauce, chili, salsa, and pizza.  Fried and fatty foods.  Avoid lying down for the 3 hours prior to your bedtime or prior to taking a nap.  Eat small, frequent meals instead of large meals.  Wear loose-fitting clothing. Do not wear anything tight around your waist that causes pressure on your stomach.  Raise the head of your bed 6 to 8 inches with wood blocks to help you sleep. Extra pillows will not help.  Only take over-the-counter or prescription medicines for pain, discomfort, or fever as directed by your caregiver.  Do not take aspirin, ibuprofen, or other nonsteroidal anti-inflammatory drugs (NSAIDs). SEEK IMMEDIATE MEDICAL CARE IF:   You have pain in your arms, neck, jaw, teeth, or back.  Your pain increases or changes in intensity or duration.  You develop nausea, vomiting, or sweating (diaphoresis).  You develop shortness of breath, or you faint.  Your vomit is green, yellow, black, or looks like coffee grounds or blood.  Your stool is red, bloody, or black. These symptoms could be signs of other problems, such as heart disease, gastric bleeding, or esophageal bleeding. MAKE SURE YOU:   Understand these instructions.  Will watch your condition.  Will get help right away if you are not doing well or get worse. Document Released: 09/23/2005 Document Revised: 03/07/2012 Document Reviewed: 07/03/2011 [facility] Patient Information 2014 [place], [place].  Shortness of Breath Shortness of breath means you have trouble breathing. Shortness of breath may  indicate that you have a medical problem. You should seek immediate medical care for shortness of breath. CAUSES   Not enough oxygen in the air (as with high altitudes or a smoke-filled room).  Short-term (acute) lung disease, including:  Infections, such as pneumonia.  Fluid in the lungs, such as heart failure.  A blood clot in the lungs (pulmonary embolism).  Long-term (chronic) lung diseases.  Heart disease (heart attack, angina, heart failure, and others).  Low red blood cells (anemia).  Poor physical fitness. This can cause shortness of breath when you exercise.  Chest or back injuries or stiffness.  Being overweight.  Smoking.  Anxiety. This can make you feel like you are not getting enough air. DIAGNOSIS  Serious medical problems can usually be found during your physical exam. Tests may also be done to determine why you are having shortness of breath. Tests may include:  Chest X-rays.  Lung function tests.  Blood tests.  Electrocardiography.  Exercise testing.  Echocardiography.  Imaging scans. Your caregiver may not be able to find a cause for your shortness of breath after your exam. In this case, it is important to have a follow-up exam with your caregiver as directed.  TREATMENT  Treatment for shortness of breath depends on the cause of your symptoms and can vary greatly. HOME CARE INSTRUCTIONS  Do not smoke. Smoking is a common cause of shortness of breath. If you smoke, ask for help to quit.  Avoid being around chemicals or things that may bother your breathing, such as paint fumes and dust.  Rest as needed. Slowly resume your usual activities.  If medicines were prescribed, take them as directed for the full length of time directed. This includes oxygen and any inhaled medicines.  Keep all follow-up appointments as directed by your caregiver. SEEK MEDICAL CARE IF:   Your condition does not improve in the time expected.  You have a hard time  doing your normal activities even with rest.  You have any side effects or problems with the medicines prescribed.  You develop any new symptoms. SEEK IMMEDIATE MEDICAL CARE IF:   Your shortness of breath gets worse.  You feel lightheaded, faint, or develop a cough not controlled with medicines.  You start coughing up blood.  You have pain with breathing.  You have chest pain or pain in your arms, shoulders, or abdomen.  You have a fever.  You are unable to walk up stairs or exercise the way you normally do. MAKE SURE YOU:  Understand these instructions.  Will watch your condition.  Will get help right away if you are not doing well or get worse. Document Released: 09/08/2001 Document Revised: 06/14/2012 Document Reviewed: 02/29/2012 [facility] Patient Information 2014 [place], [place].   Emergency Department Resource Guide 1) Find a Doctor and Pay Out of Pocket Although you won't have to find out who is covered by your insurance plan, it is a good idea to ask around and get recommendations. You will then need to call the office and see if the doctor you have chosen will accept you as a new patient and what types of options they offer for patients who are self-pay. Some doctors offer discounts or will set up payment plans for their patients who do not have insurance, but you will need to ask so you aren't surprised when you get to your appointment.  2) Contact Your Local Health Department Not all health departments have doctors that can see patients for sick visits, but many do, so it is worth a call to see if yours does. If you don't know where your local health department is, you can check in your phone book. The CDC also has a tool to help you locate your state's health department, and many state websites also have listings of all of their local health departments.  3) Find a Walk-in Clinic If your illness is not likely to be very severe or complicated, you may want to try a  walk in clinic. These are popping up all over the country in pharmacies, drugstores, and shopping centers. They're usually staffed by nurse practitioners or physician assistants that have been trained to treat common illnesses and complaints. They're usually fairly quick and inexpensive. However, if you have serious medical issues or chronic medical problems, these are probably not your best option.  No Primary Care Doctor: - Call Health Connect at  [phone] - they can help you locate a primary care doctor that  accepts your insurance, provides certain services, etc. - Physician Referral Service- [phone]  Chronic Pain Problems: Organization         Address  Phone   Notes  [facility]  [phone] Patients need to be referred by their primary care doctor.   Medication Assistance: Organization  Address  Phone   Notes  [facility] [address] [place], [place] [address] [phone] --Must be a resident of [facility] -- Must have NO insurance coverage whatsoever (no Medicaid/ Medicare, etc.) -- The pt. MUST have a primary care doctor that directs their care regularly and follows them in the community   [facility]  [phone]   [employer]  [phone]    Agencies that provide inexpensive medical care: Organization         Address  Phone   Notes  [place]  [phone]   [facility]    [phone]   [facility] [place], [place] [address] [phone]   [place] [address] [place] [phone]   Planned Parenthood    [phone]   [facility]    [phone]   [place] and [place] [address], [place] Phone:  [phone], Fax:  [phone] Hours of Operation:  9 am - 6 pm, M-F.  Also accepts Medicaid/Medicare and self-pay.  [facility] for [place] [address], [address], [place] Phone: [phone], Fax: [phone]. Hours of Operation:  8:30 am - 5:30 pm, M-F.  Also accepts Medicaid and self-pay.  [facility] [address] [place] Phone: [phone]   [place], [place], [place] [phone], Ext. [phone] Mondays & Thursdays: 7-9 AM.  First 15 patients are seen on a first come, first serve basis.    [place] Providers:  Organization         Address  Phone   Notes  [facility] [address], [place] [phone] Also accepts self-pay patients.  [facility] [address], [place]  [phone]   [place], [address], [place] [phone]   [facility] [address], [place] [phone]   Lucianne Lei [address], [place]   [phone] Only accepts [place] Access [place] patients after they have their name applied to their card.   Self-Pay (no insurance) in [facility]:  Organization         Address  Phone   Notes  Sickle Cell Patients, [facility] [place] [phone]   [facility] [place] [phone]   [facility] [place]  [place], [place], [place] [phone]   [facility]/Dr. Osei-Bonsu  [address] [place] or [address], [place] [phone] Phone number for both [place] and [place] locations is the same.  Urgent Medical and [facility] [address], [place] [phone]   [facility] [address], [place] or [address] [phone]   [facility] [place] [address] [place] [phone], phone; [phone], fax Sees  patients 1st and 3rd Saturday of every month.  Must not qualify for public or private insurance (i.e. Medicaid, Medicare, [place] Health Choice, Veterans' Benefits)  Household income should be no more than 200% of the poverty level The clinic cannot treat you if you are pregnant or think you  are pregnant  Sexually transmitted diseases are not treated at the clinic.    Dental Care: Organization         Address  Phone  Notes  [facility] Department of [facility] [place] [phone] Accepts children up to age 81 who are enrolled in [place] or [place]; pregnant women with a Medicaid card; and children who have applied for Medicaid or [place] Health Choice, but were declined, whose parents can pay a reduced fee at time of service.  [facility] Department of [facility]  [address], [place] [phone] Accepts children up to age 56 who are enrolled in [place] or [place]; pregnant women with a Medicaid card; and children who have applied for Medicaid or [place] Health Choice, but were declined, whose parents can pay a reduced fee at time of service.  [place] Adult Dental Access PROGRAM  [place] [phone] Patients are seen by appointment only. Walk-ins are not accepted. [place] will see patients 44 years of age and older. Monday - Tuesday (8am-5pm) Most Wednesdays (8:30-5pm) $30 per visit, cash only  [facility] Adult [facility] Access PROGRAM  [address], [facility] [phone] Patients are seen by appointment only. Walk-ins are not accepted. [place] will see patients 16 years of age and older. One Wednesday Evening (Monthly: Volunteer Based).  $30 per visit, cash only  [place]  [phone] for adults; Children under age 65, call Graduate Pediatric Dentistry at [phone]. Children aged 72-14, please call [phone] to request a  pediatric application.  Dental services are provided in all areas of dental care including fillings, crowns and bridges, complete and partial dentures, implants, gum treatment, root canals, and extractions. Preventive care is also provided. Treatment is provided to both adults and children. Patients are selected via a lottery and there is often a waiting list.   [facility] [address] [place]  [phone] [email or web address]   [facility] [address] [place], [place] [phone], Ext. [phone] Second and Fourth Thursday of each month, opens at 6:30 AM; Clinic ends at 9 AM.  Patients are seen on a first-come first-served basis, and a limited number are seen during each clinic.   [facility]  [address] Hillard Danker [place], [place] [phone]   Eligibility Requirements You must have lived in [place], [place], or [place] counties for at least the last three months.   You cannot be eligible for state or federal sponsored [employer], including [employer], [place], or [employer].   You generally cannot be eligible for healthcare insurance through your employer.    How to apply: Eligibility screenings are held every Tuesday and Wednesday afternoon from 1:00 pm until 4:00 pm. You do not need an appointment for the interview!  [facility] [address], [place], [place]   [place]  [place] Department  [place]  [phone]    Behavioral Health Resources in the Community: Intensive Outpatient Programs Organization         Address  Phone  Notes  [place]. [address], [place], [place] [phone]   [facility] Outpatient [address] [place], [place]   ADS: Alcohol & Drug Svcs [address],  [place], [place]   [place] [address]  [place], [place] or [phone]   Substance Abuse Resources Organization         Address  Phone  Notes  Alcohol and Drug Services  [phone]   [place]  [phone]   [facility] [place]   Chinita Pester  [phone]   Residential & Outpatient Substance Abuse Program  [phone]   Psychological Services Organization         Address  Phone  Notes  [facility] [place]  [place]  [phone]   [place] [address] [place] or [phone]    Mobile Crisis Teams Organization         Address  Phone  Notes  [facility], Mobile Crisis Care Unit  [phone]   [facility]  [address] [place], [place]   [place] Levels [address] [place] [phone]    Self-Help/Support Groups Organization         Address  Phone             Notes  [place]. of [place] - variety of support groups  [place] Call for more information  Narcotics Anonymous (NA), [facility] [address], [employer] [place]  2 meetings at this location   Special educational needs teacher         Address  Phone  Notes  [facility] Residential Treatment [place],    [place]  [phone]   [facility]  [address], [place] [address], [place], [place]   [place], [place] [phone] Admissions: 8am-3pm M-F  Incentives Substance [place] [address],    [place], [place] [phone]   [facility] [address] [place], [place], [place]   [facility] [address]  [place], [place]   [facility] Programs - Intensive Outpatient [address] Kristeen [address], [place], [place]   [facility] ([place].,    [place], [place] [phone] or [phone]   [facility] (RT[facility]) [address] [place], [place], [place] Accepts Medicaid  [facility] [place] [address],  [place] [phone] Substance Abuse/Addiction Treatment   [facility] Organization         Address  Phone  Notes  [facility]  [phone]   Domenic Schwab, PhD [address] Arlis Porta [place], [place]   [phone] or [phone]   [place], [place] [phone]   [facility] [address], [place], [place] [phone] Insurance/Medicaid/sponsorship through [facility] and Families [address]                                    [place], [place] [phone] [place] [address] Street[place] [place], [place] [phone]    Dr. Adele Schilder  [phone]   [facility] Dept. 1) [address] [place] 2) [place] 3)  [employer] [address], [place] [phone]  [phone]   [place] (  [phone] or [phone] (After Hours)

## 2014-03-18 NOTE — ED Provider Notes (Signed)
CSN: [id number]     Arrival date & time 03/18/14  4098 History   First MD Initiated Contact with Patient 03/18/14 1001     Chief Complaint  Patient presents with  . Shortness of Breath  . Gastrophageal Reflux     (Consider location/radiation/quality/duration/timing/severity/associated sxs/prior Treatment) The history is provided by the patient.   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  It has progressively worsened and now pt becomes SOB with minimal walking.  He also has SOB and throat "locking up" with eating.  Denies CP.  Has had cough productive of green sputum for the same time period.  Also with white discharge noted in the back of his throat, also for past 3 months.  Patient's companion states that last night he was snoring and momentarily stopped breathing while he was asleep and this finally convinced him to come to the ED.  Pt denies recent leg swelling but does note isolated right knee and then left ankle swelling recently that has resolved, felt like prior gout flare. Marland Kitchen  Has been taking vicodin and naprosyn for his arthralgia.  Denies taking any medication for his SOB/DOE or postnasal drip.  Pt smokes 4 cigarettes/day.   Also has had chronic issues with acid reflux, feeling or taste of acid after meals or with lying flat - will vomit if he doesn't sit up after eating.  Has been on acid reducing medication previously but states it causes N/V.  Occasional blood streak in his emesis.  Denies NSAID or ETOH use  Denies sick contacts.  He did travel to [place] 3 months ago after his symptoms began, had a syncopal episode while eating when he was there. Has not had syncope since.  Was not seen or evaluated following this episode.   Last HIV test 2 years ago was negative.      Past Medical History  Diagnosis Date  . Chest pain 2010  . Hyperlipidemia   . Coronary artery disease     SPONTANEOUS  DISSECTION AND PLAQUE RUPTURE OF HIS LEFT ANTERIOR DESCENDING ARTERY  . MI (myocardial infarction)    Past Surgical History  Procedure Laterality Date  . Cardiac catheterization  06/24/2009    EF 60%  . Cardiac catheterization  06/17/2009    EF 45%. ANTERIOR HYPOKINESIS  . US echocardiography  12/09/2009    EF 55-60%   Family History  Problem Relation Age of Onset  . Diabetes Mother   . Liver cancer Father    History  Substance Use Topics  . Smoking status: Current Some Day Smoker    Types: Cigarettes  . Smokeless tobacco: Not on file  . Alcohol Use: Yes     Comment: occassional    Review of Systems  Constitutional: Negative for fever.  HENT: Positive for congestion and postnasal drip. Negative for sore throat and trouble swallowing.   Respiratory: Positive for apnea (while sleeping last night), cough and shortness of breath. Negative for chest tightness.   Cardiovascular: Negative for chest pain and leg swelling.  Gastrointestinal: Negative for nausea, vomiting, abdominal pain and diarrhea.  Musculoskeletal: Positive for arthralgias.  Allergic/Immunologic: Negative for immunocompromised state.  Hematological: Does not bruise/bleed easily.  All other systems reviewed and are negative.      Allergies  Review of patient's allergies indicates no known allergies.  Home Medications  No current outpatient prescriptions on file. BP 114/71  Pulse 95  Temp(Src)  98.7 F (37.1 C) (Oral)  Resp 18  Ht 5\' 5"  (1.651 m)  Wt 141 lb (63.957 kg)  BMI 23.46 kg/m2  SpO2 99% Physical Exam  Nursing note and vitals reviewed. Constitutional: He appears well-developed and well-nourished. No distress.  HENT:  Head: Normocephalic and atraumatic.  White plaque over entire soft palate and bilateral tonsils   Eyes: Conjunctivae are normal.  Neck: Normal range of motion. Neck supple.  Cardiovascular: Normal rate, regular rhythm and intact distal pulses.   Pulmonary/Chest: Effort normal  and breath sounds normal. No respiratory distress. He has no wheezes. He has no rales.  Abdominal: Soft. He exhibits no distension and no mass. There is no tenderness. There is no rebound and no guarding.  Musculoskeletal: He exhibits no edema.  Neurological: He is alert. He exhibits normal muscle tone.  Skin: He is not diaphoretic.  Psychiatric: He has a normal mood and affect. His behavior is normal.    ED Course  Procedures (including critical care time) Labs Review Labs Reviewed  CBC WITH DIFFERENTIAL - Abnormal; Notable for the following:    RBC 3.13 (*)    Hemoglobin 9.6 (*)    HCT 26.1 (*)    MCHC 36.8 (*)    Lymphs Abs 0.5 (*)    All other components within normal limits  COMPREHENSIVE METABOLIC PANEL - Abnormal; Notable for the following:    Potassium 3.5 (*)    Albumin 2.5 (*)    All other components within normal limits  TROPONIN I  PRO B NATRIURETIC PEPTIDE  TROPONIN I  RAPID HIV SCREEN [facility])   Imaging Review Dg Chest 2 View  03/18/2014   CLINICAL DATA:  Shortness of breath  EXAM: CHEST  2 VIEW  COMPARISON:  02/19/2010  FINDINGS: Mild patchy right upper lobe opacity, suspicious for pneumonia. No pleural effusion or pneumothorax.  The heart is normal in size.  Visualized osseous structures are within normal limits.  IMPRESSION: Mild patchy right upper lobe opacity, suspicious for pneumonia.   Electronically Signed   By: Charline Bills M.D.   On: 03/18/2014 11:58   Ct Angio Chest Pe W/cm &/or Wo Cm  03/18/2014   CLINICAL DATA:  Shortness of breath, cough, nausea/vomiting  EXAM: CT ANGIOGRAPHY CHEST WITH CONTRAST  TECHNIQUE: Multidetector CT imaging of the chest was performed using the standard protocol during bolus administration of intravenous contrast. Multiplanar CT image reconstructions and MIPs were obtained to evaluate the vascular anatomy.  CONTRAST:  35mL OMNIPAQUE IOHEXOL 350 MG/ML SOLN  COMPARISON:  Chest radiographs dated 03/18/2014. CTA chest dated  06/15/2009.  FINDINGS: No evidence of pulmonary embolism.  Moderate paraseptal emphysematous changes with bullous changes at the right lung apex. Mild dependent atelectasis in the bilateral lower lobes. Trace pleural fluid bilaterally. No pneumothorax.  No pulmonary opacities suspicious for pneumonia. Radiographic appearance corresponds to intervening normal pulmonary parenchyma adjacent to emphysematous bullous changes.  Visualized thyroid is unremarkable.  The heart is normal size.  No pericardial effusion.  Small prevascular node measuring 6 mm short axis. No suspicious hilar or axillary lymphadenopathy.  Visualized upper abdomen is unremarkable  Visualized osseous structures are within normal limits.  Review of the MIP images confirms the above findings.  IMPRESSION: No evidence of pulmonary embolism.  Moderate paraseptal emphysematous changes with bullous changes at the right lung apex.  Mild dependent atelectasis with trace pleural fluid bilaterally.  No pulmonary opacities suspicious for pneumonia.   Electronically Signed   By: Charline Bills M.D.   On:  03/18/2014 15:01     EKG Interpretation   Date/Time:  Sunday March 18 2014 11:08:37 EDT Ventricular Rate:  79 PR Interval:  147 QRS Duration: 80 QT Interval:  360 QTC Calculation: 413 R Axis:   73 Text Interpretation:  Sinus rhythm Benign early repolarization No  significant change was found Confirmed by CAMPOS  MD, Caryn BeeKEVIN ([id number]) on  03/18/2014 11:19:37 AM      Discussed pt, workup, plan with Dr Patria Maneampos.   Filed Vitals:   03/18/14 1515  BP: 116/62  Pulse: 83  Temp:   Resp:      MDM   Final diagnoses:  Dyspnea on exertion  Acid reflux  Anemia  Bulla of lung  Oral mucosal lesion  Cough    Pt with gradually worsening SOB and DOE x 3 months, cough productive of green sputum.  CXR abnormal, showing pneumonia - however pt afebrile, WBC normal, has had worsening symptoms gradually over months, concern for alternative cause for  abnormal xray.  CT angio chest negative for PE or pneumonia, found to have emphysematous/bullous changes and trace pleural fluid.  Discussed this with Dr Patria Maneampos, will d/c home with z-pak.  Pt is breathing comfortably, lungs CTAB, O2 normal.  Counseled on smoking cessation. HIV test pending.  Also with severe acid reflux symptoms (tasting/feeling acid after eating and with lying flat, having difficulty swallowing food) that are chronic.  Pt d/c home with albuterol hfa, z-pak, close PCP follow up, GI follow up.  Discussed result, findings, treatment, and follow up  with patient.  Pt given return precautions.  Pt verbalizes understanding and agrees with plan.         Trixie Dredgemily Briseida Gittings, PA-C 03/18/14 1555

## 2014-03-18 NOTE — ED Provider Notes (Signed)
Medical screening examination/treatment/procedure(s) were performed by non-physician practitioner and as supervising physician I was immediately available for consultation/collaboration.   EKG Interpretation   Date/Time:  Sunday March 18 2014 11:08:37 EDT Ventricular Rate:  79 PR Interval:  147 QRS Duration: 80 QT Interval:  360 QTC Calculation: 413 R Axis:   73 Text Interpretation:  Sinus rhythm Benign early repolarization No  significant change was found Confirmed by Graysin Luczynski  MD, Caryn Bee ([id number]) on  03/18/2014 11:19:37 AM          Lyanne Co, MD 03/18/14 [phone]

## 2014-03-18 NOTE — ED Notes (Addendum)
Pt reports some SOB, acid reflux and cough that started before Christmas. States that he has also had post nasal drainage with the symptoms. States that he has had a white rash in his throat. Also with n/v for the past couple of days. Also reports hx of eczema.

## 2014-03-18 NOTE — ED Notes (Signed)
Pt placed in gown and on monitor at this time. Call bell at the bedside

## 2014-03-18 NOTE — Telephone Encounter (Signed)
Patient with rapid SUDS HIV + in ED. Has been suffering from SOB x 3 months  I would like to get him into [facility] for labs and MD visit  I will call him tomorrow

## 2014-03-18 NOTE — Progress Notes (Signed)
Met patient at bedside.Role of CM explained.Patient reports today's ED visit is secondary to SOB.Patient states he does not have a PCP.Education provided on the  Importance of establishing a PCP for management of Primary health care needs.Patient provided with a Resource sheet for the [facility] community health and  [employer].Patient educated regarding clinic services.Patient educated on the [employer]  Generic medication program. Teach back method used to ensure  Patient  Understanding.

## 2014-03-19 ENCOUNTER — Encounter (HOSPITAL_COMMUNITY): Payer: Self-pay | Admitting: Emergency Medicine

## 2014-03-19 ENCOUNTER — Emergency Department (HOSPITAL_COMMUNITY): Payer: No Typology Code available for payment source

## 2014-03-19 ENCOUNTER — Emergency Department (HOSPITAL_COMMUNITY)
Admission: EM | Admit: 2014-03-19 | Discharge: 2014-03-19 | Disposition: A | Discharge status: Home / Self Care | Payer: No Typology Code available for payment source | Attending: Emergency Medicine | Admitting: Emergency Medicine

## 2014-03-19 DIAGNOSIS — R197 Diarrhea, unspecified: Secondary | ICD-10-CM | POA: Insufficient documentation

## 2014-03-19 DIAGNOSIS — Z862 Personal history of diseases of the blood and blood-forming organs and certain disorders involving the immune mechanism: Secondary | ICD-10-CM | POA: Insufficient documentation

## 2014-03-19 DIAGNOSIS — B20 Human immunodeficiency virus [HIV] disease: Secondary | ICD-10-CM

## 2014-03-19 DIAGNOSIS — F17200 Nicotine dependence, unspecified, uncomplicated: Secondary | ICD-10-CM | POA: Insufficient documentation

## 2014-03-19 DIAGNOSIS — R300 Dysuria: Secondary | ICD-10-CM | POA: Insufficient documentation

## 2014-03-19 DIAGNOSIS — R0602 Shortness of breath: Secondary | ICD-10-CM | POA: Insufficient documentation

## 2014-03-19 DIAGNOSIS — Z9889 Other specified postprocedural states: Secondary | ICD-10-CM | POA: Insufficient documentation

## 2014-03-19 DIAGNOSIS — Z8639 Personal history of other endocrine, nutritional and metabolic disease: Secondary | ICD-10-CM | POA: Insufficient documentation

## 2014-03-19 DIAGNOSIS — R1033 Periumbilical pain: Secondary | ICD-10-CM | POA: Insufficient documentation

## 2014-03-19 DIAGNOSIS — Z792 Long term (current) use of antibiotics: Secondary | ICD-10-CM | POA: Insufficient documentation

## 2014-03-19 DIAGNOSIS — R1310 Dysphagia, unspecified: Secondary | ICD-10-CM | POA: Insufficient documentation

## 2014-03-19 DIAGNOSIS — I2510 Atherosclerotic heart disease of native coronary artery without angina pectoris: Secondary | ICD-10-CM | POA: Insufficient documentation

## 2014-03-19 DIAGNOSIS — R110 Nausea: Secondary | ICD-10-CM | POA: Insufficient documentation

## 2014-03-19 DIAGNOSIS — R109 Unspecified abdominal pain: Secondary | ICD-10-CM

## 2014-03-19 DIAGNOSIS — Z21 Asymptomatic human immunodeficiency virus [HIV] infection status: Secondary | ICD-10-CM | POA: Insufficient documentation

## 2014-03-19 DIAGNOSIS — Z79899 Other long term (current) drug therapy: Secondary | ICD-10-CM | POA: Insufficient documentation

## 2014-03-19 DIAGNOSIS — I252 Old myocardial infarction: Secondary | ICD-10-CM | POA: Insufficient documentation

## 2014-03-19 LAB — URINE MICROSCOPIC-ADD ON

## 2014-03-19 LAB — CBC WITH DIFFERENTIAL/PLATELET
Basophils Absolute: 0 10*3/uL (ref 0.0–0.1)
Basophils Relative: 0 % (ref 0–1)
Eosinophils Absolute: 0.1 10*3/uL (ref 0.0–0.7)
Eosinophils Relative: 1 % (ref 0–5)
HCT: 31.1 % — ABNORMAL LOW (ref 39.0–52.0)
Hemoglobin: 11.3 g/dL — ABNORMAL LOW (ref 13.0–17.0)
Lymphocytes Relative: 8 % — ABNORMAL LOW (ref 12–46)
Lymphs Abs: 0.4 10*3/uL — ABNORMAL LOW (ref 0.7–4.0)
MCH: 30.2 pg (ref 26.0–34.0)
MCHC: 36.3 g/dL — ABNORMAL HIGH (ref 30.0–36.0)
MCV: 83.2 fL (ref 78.0–100.0)
MONOS PCT: 7 % (ref 3–12)
Monocytes Absolute: 0.4 10*3/uL (ref 0.1–1.0)
NEUTROS ABS: 4.3 10*3/uL (ref 1.7–7.7)
Neutrophils Relative %: 84 % — ABNORMAL HIGH (ref 43–77)
Platelets: 188 10*3/uL (ref 150–400)
RBC: 3.74 MIL/uL — ABNORMAL LOW (ref 4.22–5.81)
RDW: 13.5 % (ref 11.5–15.5)
WBC: 5.2 10*3/uL (ref 4.0–10.5)

## 2014-03-19 LAB — URINALYSIS, ROUTINE W REFLEX MICROSCOPIC
Bilirubin Urine: NEGATIVE
Glucose, UA: NEGATIVE mg/dL
Ketones, ur: 15 mg/dL — AB
LEUKOCYTES UA: NEGATIVE
NITRITE: NEGATIVE
Protein, ur: >300 mg/dL — AB
Specific Gravity, Urine: 1.026 (ref 1.005–1.030)
UROBILINOGEN UA: 1 mg/dL (ref 0.0–1.0)
pH: 6 (ref 5.0–8.0)

## 2014-03-19 LAB — LIPASE, BLOOD: LIPASE: 20 U/L (ref 11–59)

## 2014-03-19 LAB — COMPREHENSIVE METABOLIC PANEL
ALT: 11 U/L (ref 0–53)
AST: 23 U/L (ref 0–37)
Albumin: 2.7 g/dL — ABNORMAL LOW (ref 3.5–5.2)
Alkaline Phosphatase: 73 U/L (ref 39–117)
BILIRUBIN TOTAL: 0.3 mg/dL (ref 0.3–1.2)
BUN: 12 mg/dL (ref 6–23)
CHLORIDE: 100 meq/L (ref 96–112)
CO2: 24 meq/L (ref 19–32)
Calcium: 8.8 mg/dL (ref 8.4–10.5)
Creatinine, Ser: 0.9 mg/dL (ref 0.50–1.35)
Glucose, Bld: 100 mg/dL — ABNORMAL HIGH (ref 70–99)
Potassium: 3.6 mEq/L — ABNORMAL LOW (ref 3.7–5.3)
Sodium: 137 mEq/L (ref 137–147)
Total Protein: 7.8 g/dL (ref 6.0–8.3)

## 2014-03-19 MED ORDER — HYDROCODONE-ACETAMINOPHEN 5-325 MG PO TABS: 1.0000 | ORAL_TABLET | ORAL | Status: DC | PRN

## 2014-03-19 MED ORDER — ONDANSETRON HCL 4 MG/2ML IJ SOLN
4.0000 mg | Freq: Once | INTRAMUSCULAR | Status: AC
  Administered 2014-03-19: 4 mg via INTRAVENOUS
  Filled 2014-03-19: qty 2

## 2014-03-19 MED ORDER — MECLIZINE HCL 25 MG PO TABS: 25.0000 mg | ORAL_TABLET | Freq: Three times a day (TID) | ORAL | Status: DC | PRN

## 2014-03-19 MED ORDER — SULFAMETHOXAZOLE-TRIMETHOPRIM 800-160 MG PO TABS: 1.0000 | ORAL_TABLET | Freq: Two times a day (BID) | ORAL | Status: DC

## 2014-03-19 MED ORDER — HYDROMORPHONE HCL PF 1 MG/ML IJ SOLN: 1.0000 mg | Freq: Once | INTRAMUSCULAR | Status: DC

## 2014-03-19 MED ORDER — DIAZEPAM 5 MG PO TABS: 5.0000 mg | ORAL_TABLET | Freq: Four times a day (QID) | ORAL | Status: DC | PRN

## 2014-03-19 MED ORDER — IOHEXOL 300 MG/ML  SOLN
100.0000 mL | Freq: Once | INTRAMUSCULAR | Status: AC | PRN
  Administered 2014-03-19: 100 mL via INTRAVENOUS

## 2014-03-19 MED ORDER — IOHEXOL 300 MG/ML  SOLN: 25.0000 mL | Freq: Once | INTRAMUSCULAR | Status: DC | PRN

## 2014-03-19 MED ORDER — KETOROLAC TROMETHAMINE 30 MG/ML IJ SOLN
30.0000 mg | Freq: Once | INTRAMUSCULAR | Status: AC
  Administered 2014-03-19: 30 mg via INTRAVENOUS
  Filled 2014-03-19: qty 1

## 2014-03-19 NOTE — ED Provider Notes (Signed)
CSN: [id number]     Arrival date & time 03/19/14  1424 History   First MD Initiated Contact with Patient 03/19/14 1658     Chief Complaint  Patient presents with  . Shortness of Breath  . Abdominal Pain     (Consider location/radiation/quality/duration/timing/severity/associated sxs/prior Treatment) The history is provided by the patient, medical records and a friend. No language interpreter was used.    Rodney Walker is a 33 y.o. male  with a hx of anterior MI 2010 from LAD dissection presents to the Emergency Department complaining of intermittent, cramping abdominal pain onset this morning.  Pt describes the pain as severe cramping located periumbilical.  Pt was recently Rx azithromycin (yesterday).  Patient reports his symptoms began approximately one hour after he took his azithromycin. Associated symptoms include diarrhea for several weeks which has been intermittently bloody in the past, but not in the last 4 days. He also reports severe pain with urination and nausea.  Movement does not change his pain.  No change in his SOB or DOE from yesterday. No aggravating or alleviating factors.  Pt denies fevers, chills, headache, neck pain, CP, hematuria, weakness, dizziness, syncope.      Past Medical History  Diagnosis Date  . Chest pain 2010  . Hyperlipidemia   . Coronary artery disease     SPONTANEOUS DISSECTION AND PLAQUE RUPTURE OF HIS LEFT ANTERIOR DESCENDING ARTERY  . MI (myocardial infarction)    Past Surgical History  Procedure Laterality Date  . Cardiac catheterization  06/24/2009    EF 60%  . Cardiac catheterization  06/17/2009    EF 45%. ANTERIOR HYPOKINESIS  . US echocardiography  12/09/2009    EF 55-60%   Family History  Problem Relation Age of Onset  . Diabetes Mother   . Liver cancer Father    History  Substance Use Topics  . Smoking status: Current Some Day Smoker    Types: Cigarettes  . Smokeless tobacco: Not on file  . Alcohol Use: Yes      Comment: occassional    Review of Systems  Constitutional: Negative for fever, diaphoresis, appetite change, fatigue and unexpected weight change.  HENT: Positive for trouble swallowing (Unchanged). Negative for mouth sores.   Respiratory: Positive for shortness of breath (unchanged). Negative for cough, chest tightness, wheezing and stridor.   Cardiovascular: Negative for chest pain and palpitations.  Gastrointestinal: Positive for abdominal pain. Negative for nausea, vomiting, diarrhea, constipation, blood in stool, abdominal distention and rectal pain.  Genitourinary: Negative for dysuria, urgency, frequency, hematuria, flank pain and difficulty urinating.  Musculoskeletal: Negative for back pain, neck pain and neck stiffness.  Skin: Negative for rash.  Neurological: Negative for weakness.  Hematological: Negative for adenopathy.  Psychiatric/Behavioral: Negative for confusion.  All other systems reviewed and are negative.      Allergies  Review of patient's allergies indicates no known allergies.  Home Medications   Current Outpatient Rx  Name  Route  Sig  Dispense  Refill  . albuterol (PROVENTIL HFA;VENTOLIN HFA) 108 (90 BASE) MCG/ACT inhaler   Inhalation   Inhale 2 puffs into the lungs every 6 (six) hours as needed for wheezing or shortness of breath.         Marland Kitchen azithromycin (ZITHROMAX) 250 MG tablet   Oral   Take 1 tablet (250 mg total) by mouth daily. Take first 2 tablets together, then 1 every day until finished.   6 tablet   0   . sulfamethoxazole-trimethoprim (SEPTRA DS) 800-160 MG  per tablet   Oral   Take 1 tablet by mouth every 12 (twelve) hours.   20 tablet   0    BP 119/57  Pulse 68  Temp(Src) 99.2 F (37.3 C) (Oral)  Resp 18  Ht 5\' 5"  (1.651 m)  Wt 141 lb (63.957 kg)  BMI 23.46 kg/m2  SpO2 98% Physical Exam  Nursing note and vitals reviewed. Constitutional: He is oriented to person, place, and time. He appears well-developed and well-nourished.  No distress.  Awake, alert, nontoxic appearance Chronically ill-appearing, thin  HENT:  Head: Normocephalic and atraumatic.  Mouth/Throat: No oropharyngeal exudate.  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 No scleral icterus.  Neck: Normal range of motion. Neck supple.  Cardiovascular: Normal rate, regular rhythm, normal heart sounds and intact distal pulses.   No murmur heard. No Tachycardia  Pulmonary/Chest: Effort normal and breath sounds normal. No respiratory distress. He has no wheezes.  Clear and equal breath sounds  Abdominal: Soft. Bowel sounds are normal. He exhibits no distension and no mass. There is no hepatosplenomegaly. There is no tenderness. There is no rebound and no CVA tenderness.  Abdomen soft and nontender No CVA tenderness No hepatosplenomegaly Mild generalized voluntary guarding without rebound or peritoneal signs.   Musculoskeletal: Normal range of motion. He exhibits no edema and no tenderness.  Neurological: He is alert and oriented to person, place, and time. He exhibits normal muscle tone. Coordination normal.  Speech is clear and goal oriented Moves extremities without ataxia  Skin: Skin is warm and dry. No rash noted. He is not diaphoretic. No erythema.  Psychiatric: He has a normal mood and affect.    ED Course  Procedures (including critical care time) Labs Review Labs Reviewed  CBC WITH DIFFERENTIAL - Abnormal; Notable for the following:    RBC 3.74 (*)    Hemoglobin 11.3 (*)    HCT 31.1 (*)    MCHC 36.3 (*)    Neutrophils Relative % 84 (*)    Lymphocytes Relative 8 (*)    Lymphs Abs 0.4 (*)    All other components within normal limits  COMPREHENSIVE METABOLIC PANEL - Abnormal; Notable for the following:    Potassium 3.6 (*)    Glucose, Bld 100 (*)    Albumin 2.7 (*)    All other components within normal limits  URINALYSIS, ROUTINE W REFLEX MICROSCOPIC - Abnormal; Notable  for the following:    APPearance HAZY (*)    Hgb urine dipstick TRACE (*)    Ketones, ur 15 (*)    Protein, ur >300 (*)    All other components within normal limits  URINE MICROSCOPIC-ADD ON - Abnormal; Notable for the following:    Casts HYALINE CASTS (*)    All other components within normal limits  LIPASE, BLOOD  POC OCCULT BLOOD, ED   Imaging Review Dg Chest 2 View  03/18/2014   CLINICAL DATA:  Shortness of breath  EXAM: CHEST  2 VIEW  COMPARISON:  02/19/2010  FINDINGS: Mild patchy right upper lobe opacity, suspicious for pneumonia. No pleural effusion or pneumothorax.  The heart is normal in size.  Visualized osseous structures are within normal limits.  IMPRESSION: Mild patchy right upper lobe opacity, suspicious for pneumonia.   Electronically Signed   By: Charline BillsSriyesh  Krishnan M.D.   On: 03/18/2014 11:58   Ct Angio Chest Pe W/cm &/or Wo Cm  03/18/2014   CLINICAL DATA:  Shortness of breath, cough,  nausea/vomiting  EXAM: CT ANGIOGRAPHY CHEST WITH CONTRAST  TECHNIQUE: Multidetector CT imaging of the chest was performed using the standard protocol during bolus administration of intravenous contrast. Multiplanar CT image reconstructions and MIPs were obtained to evaluate the vascular anatomy.  CONTRAST:  50mL OMNIPAQUE IOHEXOL 350 MG/ML SOLN  COMPARISON:  Chest radiographs dated 03/18/2014. CTA chest dated 06/15/2009.  FINDINGS: No evidence of pulmonary embolism.  Moderate paraseptal emphysematous changes with bullous changes at the right lung apex. Mild dependent atelectasis in the bilateral lower lobes. Trace pleural fluid bilaterally. No pneumothorax.  No pulmonary opacities suspicious for pneumonia. Radiographic appearance corresponds to intervening normal pulmonary parenchyma adjacent to emphysematous bullous changes.  Visualized thyroid is unremarkable.  The heart is normal size.  No pericardial effusion.  Small prevascular node measuring 6 mm short axis. No suspicious hilar or axillary  lymphadenopathy.  Visualized upper abdomen is unremarkable  Visualized osseous structures are within normal limits.  Review of the MIP images confirms the above findings.  IMPRESSION: No evidence of pulmonary embolism.  Moderate paraseptal emphysematous changes with bullous changes at the right lung apex.  Mild dependent atelectasis with trace pleural fluid bilaterally.  No pulmonary opacities suspicious for pneumonia.   Electronically Signed   By: Charline Bills M.D.   On: 03/18/2014 15:01   Ct Abdomen Pelvis W Contrast  03/19/2014   CLINICAL DATA:  Mid abdominal pain and shortness of breath beginning yesterday.  EXAM: CT ABDOMEN AND PELVIS WITH CONTRAST  TECHNIQUE: Multidetector CT imaging of the abdomen and pelvis was performed using the standard protocol following bolus administration of intravenous contrast.  CONTRAST:  OMNIPAQUE IOHEXOL 300 MG/ML  SOLN  COMPARISON:  None.  FINDINGS: The visualized lung bases are clear.  The liver, gallbladder, spleen, adrenal glands, left kidney, and pancreas have an unremarkable enhanced appearance. There is a punctate, 2 mm nonobstructing right upper pole renal calculus.  There is a small amount of free fluid in the pelvis. Contrast is present in multiple loops of nondilated small and large bowel to the level of the distal transverse colon. There is no evidence of bowel obstruction. The appendix is identified in the right lower quadrant and is opacified with contrast material without evidence of dilatation. No gross bowel wall thickening is identified. No definite inflammatory changes are identified in the abdomen or pelvis, although evaluation is mildly limited by lack of abdominal fat.  Mild, scattered aortic and iliac calcification is present. The osseous structures are unremarkable.  IMPRESSION: 1. Small amount of free fluid in the pelvis, which is abnormal in a male patient but of uncertain etiology. 2. Nonobstructing right renal calculus. 3. No evidence of  bowel obstruction or appendicitis.   Electronically Signed   By: Sebastian Ache   On: 03/19/2014 20:12     EKG Interpretation None      MDM   Final diagnoses:  Abdominal pain  HIV (human immunodeficiency virus infection)   Bud Face Fonner presents with "severe" abdominal pain approximately one hour after taking his azithromycin.  He denies nausea or vomiting.  Patient reports he was called by infectious disease this morning and alerted of his positive HIV test.  He reports no change in the symptoms he was evaluated for yesterday.  He reports the abdominal pain comes in waves and is currently asymptomatic.  Review shows the patient was seen in the emergency department yesterday for shortness of breath and dyspnea on exertion. At that time he was ruled out for PE and other  cardiac etiologies.  He was discharged home with azithromycin and his HIV test was pending at that time.  9:42 PM  CT scan with small amount of free fluid in the pelvis, nonobstructing right renal calculusno evidence of bowel obstruction or appendicitis.    I personally reviewed the imaging tests through PACS system  I reviewed available ER/hospitalization records through the EMR  On exam patient alert, oriented, nontoxic and nonseptic appearing.   On repeat exam he is soft and nontender abdomen. He is eating a sandwich without difficulty.   He reports that he has followup scheduled for Thursday morning with the infectious disease clinic. I recommended close followup with them. Will DC patient azithromycin and begin him on Bactrim.   It has been determined that no acute conditions requiring further emergency intervention are present at this time. The patient/guardian have been advised of the diagnosis and plan. We have discussed signs and symptoms that warrant return to the ED, such as changes or worsening in symptoms.   Vital signs are stable at discharge.   BP 119/57  Pulse 68  Temp(Src) 99.2 F (37.3 C)  (Oral)  Resp 18  Ht 5\' 5"  (1.651 m)  Wt 141 lb (63.957 kg)  BMI 23.46 kg/m2  SpO2 98%  Patient/guardian has voiced understanding and agreed to follow-up with the PCP or specialist.         Dierdre Forth, PA-C 03/19/14 2205  Dierdre Forth, PA-C 03/19/14 1610

## 2014-03-19 NOTE — Discharge Instructions (Signed)
1. Medications: bactrim, usual home medications 2. Treatment: rest, drink plenty of fluids,  3. Follow Up: Please followup with your primary doctor for discussion of your diagnoses and further evaluation after today's visit;    HIV Infection and AIDS HIV stands for human immunodeficiency virus. HIV is the virus that causes the disease known as AIDS (acquired immunodeficiency syndrome). HIV is a viral infection that attacks the T-cell lymphocytes of the human immune system. If left untreated, HIV will kill enough T-cells so that the body cannot fight off infection. Patients who have AIDS, suffer from "opportunistic infections." Opportunistic infections take advantage of the patient's weak immune system, and cause illness. RISK FACTORS   Direct contact with blood or other body fluids.  Unprotected sexual intercourse.  Sharing of contaminated needles.  Blood transfusions.  Infants whose mothers were infected, during pregnancy or through breast milk. SYMPTOMS   Sometimes, no symptoms.  Flu-like symptoms.  Repeated severe yeast infections in mouth or vagina, despite treatment.  Swollen lymph nodes.  Muscle pain.  Joint pain.  Persistent diarrhea.  Loss of appetite.  Weight loss.  Frequent opportunistic diseases:  Kaposi's sarcoma.  Pneumocystis carinii pneumonia (PCP)  Tuberculosis.  Meningitis.  Herpes simplex infections.  Blurry vision.  Loss of vision. PREVENTION   Know the sexual history of any new sexual partner.  Use safe sex practices, with barrier protection.  Avoid having multiple sexual partners.  Avoid direct contact with blood or other body fluids, by using gloves, goggles, and masks when you might encounter them.  Do not share needles. TREATMENT  HIV and AIDS have no known cure. However, with early diagnosis and proper treatment, one can live a relatively healthy and long life. Treatment is directed at decreasing the level of virus in the body  (viral load). To decrease the viral load, patients are given antiviral medicines. Patients are also given preventive care for many opportunistic diseases, such as pneumonia, tuberculosis, toxoplasmosis, tetanus, hepatitis B, pneumococcal infections, and influenza. Opportunistic infections are also treated as they develop.  Document Released: 12/14/2005 Document Revised: 03/07/2012 Document Reviewed: 03/28/2009 [facility] Patient Information 2014 [place], [place].

## 2014-03-19 NOTE — ED Notes (Signed)
Pt sts that he is having the equivalent to a male having contractions

## 2014-03-19 NOTE — ED Notes (Signed)
Pt sts seen yesterday for breathing problems and stomach issues and today has the same thing going on. Pt sts he did take the medicine we gave.

## 2014-03-19 NOTE — ED Notes (Signed)
Patient transported to CT 

## 2014-03-20 LAB — POC OCCULT BLOOD, ED: Fecal Occult Bld: NEGATIVE

## 2014-03-20 NOTE — ED Provider Notes (Signed)
Medical screening examination/treatment/procedure(s) were performed by non-physician practitioner and as supervising physician I was immediately available for consultation/collaboration.   EKG Interpretation None        Glynn Octave, MD 03/20/14 [phone]

## 2014-03-21 LAB — HIV 1/2 CONFIRMATION
HIV-1 antibody: POSITIVE
HIV-2 Ab: NEGATIVE

## 2014-03-22 ENCOUNTER — Encounter: Payer: Self-pay | Admitting: Infectious Disease

## 2014-03-22 ENCOUNTER — Encounter: Payer: Self-pay | Admitting: *Deleted

## 2014-03-22 ENCOUNTER — Ambulatory Visit (INDEPENDENT_AMBULATORY_CARE_PROVIDER_SITE_OTHER): Payer: Self-pay | Admitting: Infectious Disease

## 2014-03-22 VITALS — BP 126/75 | HR 88 | Temp 100.0°F | Wt 128.0 lb

## 2014-03-22 DIAGNOSIS — B370 Candidal stomatitis: Secondary | ICD-10-CM | POA: Insufficient documentation

## 2014-03-22 DIAGNOSIS — J189 Pneumonia, unspecified organism: Secondary | ICD-10-CM | POA: Insufficient documentation

## 2014-03-22 DIAGNOSIS — R238 Other skin changes: Secondary | ICD-10-CM

## 2014-03-22 DIAGNOSIS — Z113 Encounter for screening for infections with a predominantly sexual mode of transmission: Secondary | ICD-10-CM

## 2014-03-22 DIAGNOSIS — B20 Human immunodeficiency virus [HIV] disease: Secondary | ICD-10-CM | POA: Insufficient documentation

## 2014-03-22 DIAGNOSIS — L988 Other specified disorders of the skin and subcutaneous tissue: Secondary | ICD-10-CM

## 2014-03-22 DIAGNOSIS — I25810 Atherosclerosis of coronary artery bypass graft(s) without angina pectoris: Secondary | ICD-10-CM

## 2014-03-22 LAB — CBC WITH DIFFERENTIAL/PLATELET
BASOS ABS: 0 10*3/uL (ref 0.0–0.1)
Basophils Relative: 0 % (ref 0–1)
Eosinophils Absolute: 0.1 10*3/uL (ref 0.0–0.7)
Eosinophils Relative: 3 % (ref 0–5)
HCT: 32.8 % — ABNORMAL LOW (ref 39.0–52.0)
Hemoglobin: 11.6 g/dL — ABNORMAL LOW (ref 13.0–17.0)
LYMPHS PCT: 10 % — AB (ref 12–46)
Lymphs Abs: 0.5 10*3/uL — ABNORMAL LOW (ref 0.7–4.0)
MCH: 30.1 pg (ref 26.0–34.0)
MCHC: 35.4 g/dL (ref 30.0–36.0)
MCV: 85 fL (ref 78.0–100.0)
Monocytes Absolute: 0.4 10*3/uL (ref 0.1–1.0)
Monocytes Relative: 8 % (ref 3–12)
NEUTROS ABS: 3.7 10*3/uL (ref 1.7–7.7)
NEUTROS PCT: 79 % — AB (ref 43–77)
PLATELETS: 244 10*3/uL (ref 150–400)
RBC: 3.86 MIL/uL — ABNORMAL LOW (ref 4.22–5.81)
RDW: 13.9 % (ref 11.5–15.5)
WBC: 4.7 10*3/uL (ref 4.0–10.5)

## 2014-03-22 MED ORDER — FLUCONAZOLE 100 MG PO TABS: ORAL_TABLET | ORAL | Status: DC

## 2014-03-22 MED ORDER — LEVOFLOXACIN 500 MG PO TABS: 500.0000 mg | ORAL_TABLET | Freq: Every day | ORAL | Status: DC

## 2014-03-22 MED ORDER — SULFAMETHOXAZOLE-TRIMETHOPRIM 800-160 MG PO TABS: 1.0000 | ORAL_TABLET | Freq: Two times a day (BID) | ORAL | Status: DC

## 2014-03-22 MED ORDER — RALTEGRAVIR POTASSIUM 400 MG PO TABS: 400.0000 mg | ORAL_TABLET | Freq: Two times a day (BID) | ORAL | Status: DC

## 2014-03-22 MED ORDER — EMTRICITABINE-TENOFOVIR DF 200-300 MG PO TABS: 1.0000 | ORAL_TABLET | Freq: Every day | ORAL | Status: DC

## 2014-03-22 NOTE — Progress Notes (Signed)
Subjective:    Patient ID: Rodney Walker, male    DOB: [birth year], 33 y.o.   MRN: [id number]  HPI  33 year old newly diagnosed African American man with HIV, and AIDS. 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   He had presented to the ED on several occasions this year with knee pain and ankle pain thought to be gout.   He also had DOE since December with fevers. CXR had shown RUL infiltrate and he was given azithromcyin and then bactrim DS bid after not tolerating azirhromycin.  He was found to have frank thrush and Rapid HIV EIA was positive now with confirmatory WB positive.  I had been alerted to his positive test via [facility] system and arranged for him to be seen today in clinic. He has met with Tish and filled out paperwork for [employer] and ADAP.    Review of Systems  Constitutional: Positive for fever, chills, diaphoresis and fatigue. Negative for activity change, appetite change and unexpected weight change.  HENT: Positive for sore throat. Negative for congestion, rhinorrhea, sinus pressure, sneezing and trouble swallowing.   Eyes: Negative for photophobia and visual disturbance.  Respiratory: Positive for cough and shortness of breath. Negative for chest tightness, wheezing and stridor.   Cardiovascular: Positive for leg swelling. Negative for chest pain and palpitations.  Gastrointestinal: Negative for nausea, vomiting, abdominal pain, diarrhea and abdominal distention.  Genitourinary: Negative for dysuria, hematuria, flank pain and difficulty urinating.  Musculoskeletal: Positive for arthralgias. Negative for back pain, gait problem, joint swelling and myalgias.  Skin: Negative for color change, pallor, rash and wound.  Neurological: Negative for dizziness, tremors, weakness and light-headedness.  Hematological: Negative for adenopathy. Does not  bruise/bleed easily.  Psychiatric/Behavioral: Negative for behavioral problems, confusion, sleep disturbance, dysphoric mood, decreased concentration and agitation.       Objective:   Physical Exam  Nursing note and vitals reviewed. Constitutional: He is oriented to person, place, and time. No distress.  HENT:  Head: Normocephalic and atraumatic.  Mouth/Throat: Posterior oropharyngeal erythema present.    Eyes: Conjunctivae and EOM are normal. Pupils are equal, round, and reactive to light. No scleral icterus.  Neck: Normal range of motion. Neck supple. No JVD present.  Cardiovascular: Normal rate, regular rhythm and normal heart sounds.  Exam reveals no gallop and no friction rub.   No murmur heard. Pulmonary/Chest: Effort normal and breath sounds normal. No respiratory distress. He has no wheezes. He has no rales. He exhibits no tenderness.  Abdominal: He exhibits no distension and no mass. There is no tenderness. There is no rebound and no guarding.  Musculoskeletal: He exhibits no edema and no tenderness.  Lymphadenopathy:    He has no cervical adenopathy.  Neurological: He is alert and oriented to person, place, and time. He has normal reflexes. He exhibits normal muscle tone. Coordination normal.  Skin: Skin is warm and dry. He is not diaphoretic. No erythema. No pallor.  Psychiatric: He has a normal mood and affect. His behavior is normal. Judgment and thought content normal.          Assessment & Plan:    HIV/AIDS:  --check HIV VL reflex to genotype, CD4 --I will start Isentress BID and Truvada (had samples of both) --emergent ADAP application --daily TMP/SMX  I spent greater than 60 minutes with the patient including greater than 50% of time in  face to face counsel of the patient and in coordination of their care.  I also had his friend tested via rapid Oraquick test   Pneumovax  Check hep panel, RPR, HLA  RUL PNeumonia: his Chest CT that I have  subsequently reviewed is not suggestive of CAP, but I had already written for levaquin for 5 days  He does have [place]. 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  I had told him I would also write letter excusing him from work but he left before I had composed it, will write it and have him come back for it. Bring back next 2 weeks to see how he is doing  THrush: treat with 14 days of fluconazole

## 2014-03-23 ENCOUNTER — Encounter: Payer: Self-pay | Admitting: *Deleted

## 2014-03-23 ENCOUNTER — Telehealth: Payer: Self-pay | Admitting: *Deleted

## 2014-03-23 DIAGNOSIS — B20 Human immunodeficiency virus [HIV] disease: Secondary | ICD-10-CM

## 2014-03-23 DIAGNOSIS — B370 Candidal stomatitis: Secondary | ICD-10-CM

## 2014-03-23 DIAGNOSIS — J189 Pneumonia, unspecified organism: Secondary | ICD-10-CM

## 2014-03-23 DIAGNOSIS — R238 Other skin changes: Secondary | ICD-10-CM

## 2014-03-23 DIAGNOSIS — I25810 Atherosclerosis of coronary artery bypass graft(s) without angina pectoris: Secondary | ICD-10-CM

## 2014-03-23 LAB — COMPLETE METABOLIC PANEL WITH GFR
ALBUMIN: 3 g/dL — AB (ref 3.5–5.2)
ALT: 11 U/L (ref 0–53)
AST: 22 U/L (ref 0–37)
Alkaline Phosphatase: 67 U/L (ref 39–117)
BUN: 12 mg/dL (ref 6–23)
CO2: 25 mEq/L (ref 19–32)
Calcium: 8.5 mg/dL (ref 8.4–10.5)
Chloride: 97 mEq/L (ref 96–112)
Creat: 1.12 mg/dL (ref 0.50–1.35)
GFR, EST NON AFRICAN AMERICAN: 86 mL/min
GFR, Est African American: >89 mL/min
GLUCOSE: 88 mg/dL (ref 70–99)
Potassium: 3.8 mEq/L (ref 3.5–5.3)
Sodium: 133 mEq/L — ABNORMAL LOW (ref 135–145)
Total Bilirubin: 0.3 mg/dL (ref 0.2–1.2)
Total Protein: 7.1 g/dL (ref 6.0–8.3)

## 2014-03-23 LAB — T-HELPER CELL (CD4) - (RCID CLINIC ONLY)
CD4 T CELL HELPER: 4 % — AB (ref 33–55)
CD4 T Cell Abs: 20 /uL — ABNORMAL LOW (ref 400–2700)

## 2014-03-23 LAB — RPR

## 2014-03-23 LAB — HEPATITIS PANEL, ACUTE
HCV Ab: NEGATIVE
HEP B S AG: NEGATIVE
Hep A IgM: NONREACTIVE
Hep B C IgM: NONREACTIVE

## 2014-03-23 LAB — LACTATE DEHYDROGENASE: LDH: 252 U/L — ABNORMAL HIGH (ref 94–250)

## 2014-03-23 MED ORDER — SULFAMETHOXAZOLE-TRIMETHOPRIM 800-160 MG PO TABS: 1.0000 | ORAL_TABLET | Freq: Two times a day (BID) | ORAL | Status: DC

## 2014-03-23 MED ORDER — FLUCONAZOLE 100 MG PO TABS: ORAL_TABLET | ORAL | Status: DC

## 2014-03-23 NOTE — Telephone Encounter (Signed)
Pt received assistance from [facility] to pay for these medications.

## 2014-03-23 NOTE — Addendum Note (Signed)
Addended by: Jennet Maduro D on: 03/23/2014 10:53 AM   Modules accepted: Orders

## 2014-03-23 NOTE — Telephone Encounter (Signed)
Per Dr. Daiva Eves discontinue Levaquin.  No active pneumonia present on CT.

## 2014-03-25 LAB — HIV-1 RNA ULTRAQUANT REFLEX TO GENTYP+
HIV 1 RNA QUANT: 366287 {copies}/mL — AB (ref <20)
HIV-1 RNA QUANT, LOG: 5.56 {Log} — AB (ref <1.30)

## 2014-03-26 ENCOUNTER — Other Ambulatory Visit: Payer: Self-pay | Admitting: Licensed Clinical Social Worker

## 2014-03-26 ENCOUNTER — Telehealth: Payer: Self-pay | Admitting: *Deleted

## 2014-03-26 DIAGNOSIS — J189 Pneumonia, unspecified organism: Secondary | ICD-10-CM

## 2014-03-26 DIAGNOSIS — R238 Other skin changes: Secondary | ICD-10-CM

## 2014-03-26 DIAGNOSIS — B370 Candidal stomatitis: Secondary | ICD-10-CM

## 2014-03-26 DIAGNOSIS — I25810 Atherosclerosis of coronary artery bypass graft(s) without angina pectoris: Secondary | ICD-10-CM

## 2014-03-26 DIAGNOSIS — B20 Human immunodeficiency virus [HIV] disease: Secondary | ICD-10-CM

## 2014-03-26 MED ORDER — RALTEGRAVIR POTASSIUM 400 MG PO TABS: 400.0000 mg | ORAL_TABLET | Freq: Two times a day (BID) | ORAL | Status: DC

## 2014-03-26 MED ORDER — EMTRICITABINE-TENOFOVIR DF 200-300 MG PO TABS: 1.0000 | ORAL_TABLET | Freq: Every day | ORAL | Status: DC

## 2014-03-26 NOTE — Telephone Encounter (Signed)
Patient has 1000 mg Azithromycin at home.  He will take those today.  Morrison Old has asked that his application be processed emergently.  She will call back to verify the status tomorrow.  Patient informed, in agreement. Andree Coss, RN

## 2014-03-26 NOTE — Telephone Encounter (Signed)
Message copied by Andree Coss on Mon Mar 26, 2014  4:46 PM ------      Message from: Merrie Roof A      Created: Mon Mar 26, 2014  4:07 PM                   ----- Message -----         From: Randall Hiss, MD         Sent: 03/23/2014   6:24 PM           To: Minh Sylvia, [place], Imp Triage Nurse Pool            Rodney Walker has a CD4 of 20. He needs to start weekly azithromycin. Couple of things. He has ARV because I gave him a bottle but I think he really needs his ADAP expedited so he can get his other meds. ANy wY WE CAn scrounge up some more azithro for him to get him through first month? ------

## 2014-03-27 LAB — HLA B*5701: HLA-B 5701 W/RFLX HLA-B HIGH: NEGATIVE

## 2014-03-28 ENCOUNTER — Telehealth: Payer: Self-pay | Admitting: Licensed Clinical Social Worker

## 2014-03-28 ENCOUNTER — Telehealth: Payer: Self-pay | Admitting: *Deleted

## 2014-03-28 LAB — QUANTIFERON TB GOLD ASSAY (BLOOD)
MITOGEN VALUE: 0.1 [IU]/mL
QUANTIFERON TB AG MINUS NIL: 0 [IU]/mL
Quantiferon Nil Value: 0.04 IU/mL
TB Ag value: 0.03 IU/mL

## 2014-03-28 NOTE — Telephone Encounter (Signed)
Minh pham offered to procure azithro for mAvium prohylaxis and even bactrim for PCP prophylaxis.  He doesn thave MAvium in the lungs though he could possibly have slow smoldering PCP infection, I did not elect to try to treat for PCP--but maybe we should  I think he needs to be seen again in the Clinic to re-evaluate him  He also has SEVERE BULLOUS changes in his lungs seen on CT scan that probably contribute to his pulm ssx

## 2014-03-28 NOTE — Telephone Encounter (Signed)
Do you think it should be this week? None of the providers have anything, and you are booked double in some spots tomorrow

## 2014-03-28 NOTE — Telephone Encounter (Signed)
I dont think I have room if anyone else is not double booked at all would help if they could,

## 2014-03-28 NOTE — Telephone Encounter (Signed)
Patient came in today with multiple concerns, he was told he had pneumonia in the hospital but states recently told from here that he didn't have it. Patient experiencing shortness of breath after hot shower yesterday, he has concerns about MAC because of a call from our office stating he needed azithromycin to prevent MAC. Patient also has sore throat on the right side that started yesterday, patient states the thrush that was present on his tongue is now gone since starting fluconazole. Patient would like to be seen to know if all this is normal due to his low counts. Tisha our financial counselor arrange to have his ADAP expedited due to his low numbers, although he has samples he will be out soon.

## 2014-03-28 NOTE — Telephone Encounter (Signed)
error 

## 2014-03-29 ENCOUNTER — Other Ambulatory Visit: Payer: Self-pay | Admitting: *Deleted

## 2014-03-29 ENCOUNTER — Telehealth: Payer: Self-pay | Admitting: *Deleted

## 2014-03-29 ENCOUNTER — Other Ambulatory Visit: Payer: Self-pay | Admitting: Licensed Clinical Social Worker

## 2014-03-29 DIAGNOSIS — I25810 Atherosclerosis of coronary artery bypass graft(s) without angina pectoris: Secondary | ICD-10-CM

## 2014-03-29 DIAGNOSIS — R238 Other skin changes: Secondary | ICD-10-CM

## 2014-03-29 DIAGNOSIS — B20 Human immunodeficiency virus [HIV] disease: Secondary | ICD-10-CM

## 2014-03-29 DIAGNOSIS — B370 Candidal stomatitis: Secondary | ICD-10-CM

## 2014-03-29 DIAGNOSIS — J189 Pneumonia, unspecified organism: Secondary | ICD-10-CM

## 2014-03-29 MED ORDER — AZITHROMYCIN 600 MG PO TABS: 1200.0000 mg | ORAL_TABLET | ORAL | Status: DC

## 2014-03-29 MED ORDER — EMTRICITABINE-TENOFOVIR DF 200-300 MG PO TABS: 1.0000 | ORAL_TABLET | Freq: Every day | ORAL | Status: DC

## 2014-03-29 MED ORDER — RALTEGRAVIR POTASSIUM 400 MG PO TABS: 400.0000 mg | ORAL_TABLET | Freq: Two times a day (BID) | ORAL | Status: DC

## 2014-03-29 MED ORDER — FLUCONAZOLE 100 MG PO TABS: ORAL_TABLET | ORAL | Status: DC

## 2014-03-29 MED ORDER — SULFAMETHOXAZOLE-TRIMETHOPRIM 800-160 MG PO TABS: 1.0000 | ORAL_TABLET | Freq: Once | ORAL | Status: DC

## 2014-03-29 MED ORDER — SULFAMETHOXAZOLE-TRIMETHOPRIM 800-160 MG PO TABS: 1.0000 | ORAL_TABLET | Freq: Two times a day (BID) | ORAL | Status: DC

## 2014-03-29 NOTE — Telephone Encounter (Signed)
Patient states he feels better than yesterday, declined an appointment for this afternoon (no transportation available later today).  Will keep his appointment for 4/14 with Dr. Ninetta Lights.  RN advised him that we will be closed Friday, will not have a physician available Monday.  Pt stated he understood.  Pt advised to go to the ED over the weekend if he has any issues.  He has been ADAP approved, [facility] has his medications ready.  He is on the way to pick them up now. Andree Coss, RN

## 2014-03-29 NOTE — Telephone Encounter (Signed)
I would like him to come in to discuss because for now the isentress and truvada I started him on is just fine to continue. When is he scheduled to come see him  In other words I would like him to be on isentress and truvada until we see eachother

## 2014-03-29 NOTE — Telephone Encounter (Signed)
He is seeing [facility] on 4/14, as you have no openings.  Would you like to overbook him somewhere on your schedule instead?

## 2014-03-29 NOTE — Telephone Encounter (Signed)
Patient is approved for ADAP. Please advise on his new regimen. Andree Coss, RN

## 2014-03-30 ENCOUNTER — Emergency Department (HOSPITAL_COMMUNITY): Payer: No Typology Code available for payment source

## 2014-03-30 ENCOUNTER — Encounter (HOSPITAL_COMMUNITY): Payer: Self-pay | Admitting: Emergency Medicine

## 2014-03-30 ENCOUNTER — Inpatient Hospital Stay (HOSPITAL_COMMUNITY)
Admission: EM | Admit: 2014-03-30 | Discharge: 2014-04-03 | DRG: 974 | Disposition: A | Discharge status: Home / Self Care | Payer: Self-pay | Attending: Internal Medicine | Admitting: Internal Medicine

## 2014-03-30 DIAGNOSIS — Z833 Family history of diabetes mellitus: Secondary | ICD-10-CM

## 2014-03-30 DIAGNOSIS — A419 Sepsis, unspecified organism: Secondary | ICD-10-CM | POA: Diagnosis present

## 2014-03-30 DIAGNOSIS — N179 Acute kidney failure, unspecified: Secondary | ICD-10-CM

## 2014-03-30 DIAGNOSIS — R509 Fever, unspecified: Secondary | ICD-10-CM

## 2014-03-30 DIAGNOSIS — K219 Gastro-esophageal reflux disease without esophagitis: Secondary | ICD-10-CM | POA: Diagnosis present

## 2014-03-30 DIAGNOSIS — I252 Old myocardial infarction: Secondary | ICD-10-CM

## 2014-03-30 DIAGNOSIS — J438 Other emphysema: Secondary | ICD-10-CM | POA: Diagnosis present

## 2014-03-30 DIAGNOSIS — I2510 Atherosclerotic heart disease of native coronary artery without angina pectoris: Secondary | ICD-10-CM

## 2014-03-30 DIAGNOSIS — B59 Pneumocystosis: Secondary | ICD-10-CM

## 2014-03-30 DIAGNOSIS — B20 Human immunodeficiency virus [HIV] disease: Principal | ICD-10-CM

## 2014-03-30 DIAGNOSIS — R1110 Vomiting, unspecified: Secondary | ICD-10-CM

## 2014-03-30 DIAGNOSIS — R61 Generalized hyperhidrosis: Secondary | ICD-10-CM | POA: Diagnosis present

## 2014-03-30 DIAGNOSIS — R059 Cough, unspecified: Secondary | ICD-10-CM | POA: Diagnosis present

## 2014-03-30 DIAGNOSIS — E871 Hypo-osmolality and hyponatremia: Secondary | ICD-10-CM

## 2014-03-30 DIAGNOSIS — Z800 Family history of malignant neoplasm of digestive organs: Secondary | ICD-10-CM

## 2014-03-30 DIAGNOSIS — J189 Pneumonia, unspecified organism: Secondary | ICD-10-CM

## 2014-03-30 DIAGNOSIS — R5381 Other malaise: Secondary | ICD-10-CM | POA: Diagnosis present

## 2014-03-30 DIAGNOSIS — J029 Acute pharyngitis, unspecified: Secondary | ICD-10-CM

## 2014-03-30 DIAGNOSIS — R5383 Other fatigue: Secondary | ICD-10-CM

## 2014-03-30 DIAGNOSIS — R05 Cough: Secondary | ICD-10-CM | POA: Diagnosis present

## 2014-03-30 DIAGNOSIS — R1310 Dysphagia, unspecified: Secondary | ICD-10-CM | POA: Diagnosis present

## 2014-03-30 DIAGNOSIS — F17200 Nicotine dependence, unspecified, uncomplicated: Secondary | ICD-10-CM | POA: Diagnosis present

## 2014-03-30 DIAGNOSIS — R112 Nausea with vomiting, unspecified: Secondary | ICD-10-CM | POA: Diagnosis present

## 2014-03-30 DIAGNOSIS — E785 Hyperlipidemia, unspecified: Secondary | ICD-10-CM | POA: Diagnosis present

## 2014-03-30 DIAGNOSIS — B370 Candidal stomatitis: Secondary | ICD-10-CM

## 2014-03-30 LAB — CBC WITH DIFFERENTIAL/PLATELET
Basophils Absolute: 0 10*3/uL (ref 0.0–0.1)
Basophils Relative: 0 % (ref 0–1)
Eosinophils Absolute: 0.3 10*3/uL (ref 0.0–0.7)
Eosinophils Relative: 4 % (ref 0–5)
HCT: 33.3 % — ABNORMAL LOW (ref 39.0–52.0)
Hemoglobin: 12.3 g/dL — ABNORMAL LOW (ref 13.0–17.0)
LYMPHS ABS: 0.5 10*3/uL — AB (ref 0.7–4.0)
LYMPHS PCT: 7 % — AB (ref 12–46)
MCH: 30.7 pg (ref 26.0–34.0)
MCHC: 36.9 g/dL — ABNORMAL HIGH (ref 30.0–36.0)
MCV: 83 fL (ref 78.0–100.0)
Monocytes Absolute: 0.7 10*3/uL (ref 0.1–1.0)
Monocytes Relative: 8 % (ref 3–12)
NEUTROS ABS: 6.4 10*3/uL (ref 1.7–7.7)
NEUTROS PCT: 80 % — AB (ref 43–77)
PLATELETS: 249 10*3/uL (ref 150–400)
RBC: 4.01 MIL/uL — AB (ref 4.22–5.81)
RDW: 13.2 % (ref 11.5–15.5)
WBC: 8 10*3/uL (ref 4.0–10.5)

## 2014-03-30 LAB — COMPREHENSIVE METABOLIC PANEL
ALT: 15 U/L (ref 0–53)
AST: 17 U/L (ref 0–37)
Albumin: 2.8 g/dL — ABNORMAL LOW (ref 3.5–5.2)
Alkaline Phosphatase: 72 U/L (ref 39–117)
BILIRUBIN TOTAL: 0.3 mg/dL (ref 0.3–1.2)
BUN: 23 mg/dL (ref 6–23)
CHLORIDE: 90 meq/L — AB (ref 96–112)
CO2: 22 meq/L (ref 19–32)
Calcium: 9.5 mg/dL (ref 8.4–10.5)
Creatinine, Ser: 1.64 mg/dL — ABNORMAL HIGH (ref 0.50–1.35)
GFR, EST AFRICAN AMERICAN: 62 mL/min — AB (ref >90)
GFR, EST NON AFRICAN AMERICAN: 54 mL/min — AB (ref >90)
GLUCOSE: 116 mg/dL — AB (ref 70–99)
POTASSIUM: 4.3 meq/L (ref 3.7–5.3)
SODIUM: 127 meq/L — AB (ref 137–147)
Total Protein: 8.4 g/dL — ABNORMAL HIGH (ref 6.0–8.3)

## 2014-03-30 LAB — URINE MICROSCOPIC-ADD ON

## 2014-03-30 LAB — URINALYSIS, ROUTINE W REFLEX MICROSCOPIC
Bilirubin Urine: NEGATIVE
Glucose, UA: NEGATIVE mg/dL
Ketones, ur: NEGATIVE mg/dL
Leukocytes, UA: NEGATIVE
Nitrite: NEGATIVE
Specific Gravity, Urine: 1.022 (ref 1.005–1.030)
UROBILINOGEN UA: 0.2 mg/dL (ref 0.0–1.0)
pH: 6 (ref 5.0–8.0)

## 2014-03-30 LAB — HIV-1 INTEGRASE GENOTYPE

## 2014-03-30 LAB — LACTIC ACID, PLASMA: Lactic Acid, Venous: 1.2 mmol/L (ref 0.5–2.2)

## 2014-03-30 LAB — LIPASE, BLOOD: Lipase: 25 U/L (ref 11–59)

## 2014-03-30 LAB — RAPID STREP SCREEN (MED CTR MEBANE ONLY): Streptococcus, Group A Screen (Direct): NEGATIVE

## 2014-03-30 MED ORDER — ONDANSETRON HCL 4 MG/2ML IJ SOLN
4.0000 mg | Freq: Once | INTRAMUSCULAR | Status: AC
  Administered 2014-03-30: 4 mg via INTRAVENOUS
  Filled 2014-03-30: qty 2

## 2014-03-30 MED ORDER — SODIUM CHLORIDE 0.9 % IV BOLUS (SEPSIS)
1000.0000 mL | Freq: Once | INTRAVENOUS | Status: AC
  Administered 2014-03-30: 1000 mL via INTRAVENOUS

## 2014-03-30 MED ORDER — ACETAMINOPHEN 325 MG PO TABS
650.0000 mg | ORAL_TABLET | Freq: Four times a day (QID) | ORAL | Status: DC | PRN
  Administered 2014-03-31: 650 mg via ORAL
  Filled 2014-03-30: qty 2

## 2014-03-30 MED ORDER — SODIUM CHLORIDE 0.9 % IV SOLN
INTRAVENOUS | Status: DC
  Administered 2014-03-31 – 2014-04-02 (×4): via INTRAVENOUS
  Administered 2014-04-02: 75 mL/h via INTRAVENOUS

## 2014-03-30 MED ORDER — KETOROLAC TROMETHAMINE 30 MG/ML IJ SOLN
30.0000 mg | Freq: Once | INTRAMUSCULAR | Status: AC
  Administered 2014-03-30: 30 mg via INTRAVENOUS
  Filled 2014-03-30: qty 1

## 2014-03-30 MED ORDER — FLUCONAZOLE IN SODIUM CHLORIDE 200-0.9 MG/100ML-% IV SOLN
200.0000 mg | Freq: Every day | INTRAVENOUS | Status: DC
  Administered 2014-03-31 (×2): 200 mg via INTRAVENOUS
  Filled 2014-03-30 (×3): qty 100

## 2014-03-30 MED ORDER — FLUCONAZOLE IN SODIUM CHLORIDE 200-0.9 MG/100ML-% IV SOLN: 200.0000 mg | INTRAVENOUS | Status: DC

## 2014-03-30 NOTE — ED Notes (Signed)
MD at bedside at this time.

## 2014-03-30 NOTE — ED Provider Notes (Signed)
TIME SEEN: 10:00 PM  CHIEF COMPLAINT: Fever, vomiting, sore throat, body aches  HPI: Patient is a 33 year old male with a history of prior anterior wall MI in 2010, HIV and AIDS who was recently started on Truvada and Isentress as well as azithromycin, fluconazole and Bactrim who presents emergency department with 3 days of fever, vomiting. He is also had right-sided sore throat. No ear pain. He has had a cough with yellow sputum production. He denies any abdominal pain. No diarrhea. No dysuria or hematuria. No rash. No headache, neck pain or neck tenderness. He recently established care with Dr. Daiva EvesVan Dam and was seen on 3/26. At that time he had oral thrush which he reports is improving. He had a CD4 count at that time which was 20. His viral load was 366,287.  He denies that he has had opportunistic infections in the past but was recently just diagnosed with HIV.  ROS: See HPI Constitutional:  fever  Eyes: no drainage  ENT: no runny nose   Cardiovascular:  no chest pain  Resp: no SOB  GI:  vomiting GU: no dysuria Integumentary: no rash  Allergy: no hives  Musculoskeletal: no leg swelling  Neurological: no slurred speech ROS otherwise negative  PAST MEDICAL HISTORY/PAST SURGICAL HISTORY:  Past Medical History  Diagnosis Date  . Chest pain 2010  . Hyperlipidemia   . Coronary artery disease     SPONTANEOUS DISSECTION AND PLAQUE RUPTURE OF HIS LEFT ANTERIOR DESCENDING ARTERY  . MI (myocardial infarction)   . HIV infection     MEDICATIONS:  Prior to Admission medications   Medication Sig Start Date End Date Taking? Authorizing Provider  albuterol (PROVENTIL HFA;VENTOLIN HFA) 108 (90 BASE) MCG/ACT inhaler Inhale 2 puffs into the lungs every 6 (six) hours as needed for wheezing or shortness of breath.   Yes Historical Provider, MD  azithromycin (ZITHROMAX) 600 MG tablet Take 2 tablets (1,200 mg total) by mouth once a week. 03/29/14  Yes Randall Hissornelius N Van Dam, MD  emtricitabine-tenofovir  (TRUVADA) 200-300 MG per tablet Take 1 tablet by mouth daily. 03/29/14  Yes Randall Hissornelius N [place], MD  fluconazole (DIFLUCAN) 100 MG tablet Take 100 mg by mouth daily.   Yes Historical Provider, MD  raltegravir (ISENTRESS) 400 MG tablet Take 1 tablet (400 mg total) by mouth 2 (two) times daily. 03/29/14  Yes Randall Hissornelius N [place], MD  sulfamethoxazole-trimethoprim [facility](SEPTRA DS) 800-160 MG per tablet Take 1 tablet by mouth once. 03/29/14  Yes Randall Hissornelius N [place], MD    ALLERGIES:  No Known Allergies  SOCIAL HISTORY:  History  Substance Use Topics  . Smoking status: Current Some Day Smoker    Types: Cigarettes  . Smokeless tobacco: Not on file  . Alcohol Use: Yes     Comment: occassional    FAMILY HISTORY: Family History  Problem Relation Age of Onset  . Diabetes Mother   . Liver cancer Father     EXAM: BP 128/63  Pulse 99  Temp(Src) 99.9 F (37.7 C) (Oral)  Resp 26  SpO2 97% CONSTITUTIONAL: Alert and oriented and responds appropriately to questions. Well-appearing; well-nourished, nontoxic appearing HEAD: Normocephalic EYES: Conjunctivae clear, PERRL ENT: normal nose; no rhinorrhea; moist mucous membranes; oropharynx has no erythema, no tonsillar hypertrophy or exudate, patient does have ulcerated lesions around his right tonsil, no trismus or drooling, no uvular deviation, no muffled voice NECK: Supple, no meningismus, no LAD  CARD: Regular and tachycardic; S1 and S2 appreciated; no murmurs, no clicks, no  rubs, no gallops RESP: Normal chest excursion without splinting or tachypnea; breath sounds clear and equal bilaterally; no wheezes, no rhonchi, no rales,  ABD/GI: Normal bowel sounds; non-distended; soft, non-tender, no rebound, no guarding BACK:  The back appears normal and is non-tender to palpation, there is no CVA tenderness EXT: Normal ROM in all joints; non-tender to palpation; no edema; normal capillary refill; no cyanosis    SKIN: Normal color for age and race; warm NEURO:  Moves all extremities equally, sensation to light touch intact diffusely, cranial nerves II through XII intact PSYCH: The patient's mood and manner are appropriate. Grooming and personal hygiene are appropriate.  MEDICAL DECISION MAKING: Patient here with fever who is severely immunocompromised with AIDS. He is hemodynamically stable and is nontoxic appearing on exam. Will obtain labs, cultures, urine, chest x-ray, strep swab, influenza swab. We'll give Toradol, IV fluids and Zofran. His abdominal exam is completely benign. I do not feel he needs CT imaging of his abdomen at this time.  ED PROGRESS: Patient's labs show hyponatremia, hypochloremia, elevated creatinine. He has proteinuria and hematuria. His lactate is normal. Abdominal series shows no obvious infiltrate or obstruction. 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 Discussed with patient's infectious disease physician, Dr. Daiva Eves.  He recommends admission for IV hydration and starting IV fluconazole. He also recommends obtaining a PCP sputum smear. He does not recommend broad-spectrum device at this time unless a source has been found. He states the patient's acute renal failure may be due to dehydration but also may be due to his anti-retroviral medications. Patient does not have a primary care physician. We'll discuss with hospitalist for admission. Infectious disease will consult.     Layla Maw Ayani Ospina, DO 03/30/14 [id number]

## 2014-03-30 NOTE — ED Notes (Signed)
Bed: WA06 Expected date:  Expected time:  Means of arrival:  Comments: EMS/32 yo male with fever today

## 2014-03-30 NOTE — ED Notes (Addendum)
Pt BIB EMS, reports he has had a fever with nausea, HA and and generalized body aches for the past 3 days.  Pt a&o x4, skin warm and dry. Pt took 1000mg  Tylenol at 2051. Pt recently dx with HIV x3 weeks ago. Pt also reports feeling dizzy intermittently.

## 2014-03-31 DIAGNOSIS — J029 Acute pharyngitis, unspecified: Secondary | ICD-10-CM | POA: Diagnosis present

## 2014-03-31 DIAGNOSIS — N179 Acute kidney failure, unspecified: Secondary | ICD-10-CM

## 2014-03-31 DIAGNOSIS — B370 Candidal stomatitis: Secondary | ICD-10-CM

## 2014-03-31 DIAGNOSIS — R509 Fever, unspecified: Secondary | ICD-10-CM

## 2014-03-31 DIAGNOSIS — R1310 Dysphagia, unspecified: Secondary | ICD-10-CM

## 2014-03-31 DIAGNOSIS — I2510 Atherosclerotic heart disease of native coronary artery without angina pectoris: Secondary | ICD-10-CM

## 2014-03-31 DIAGNOSIS — R1110 Vomiting, unspecified: Secondary | ICD-10-CM

## 2014-03-31 DIAGNOSIS — E871 Hypo-osmolality and hyponatremia: Secondary | ICD-10-CM

## 2014-03-31 DIAGNOSIS — B20 Human immunodeficiency virus [HIV] disease: Principal | ICD-10-CM

## 2014-03-31 LAB — CBC
HCT: 28.5 % — ABNORMAL LOW (ref 39.0–52.0)
HEMOGLOBIN: 10.5 g/dL — AB (ref 13.0–17.0)
MCH: 30.8 pg (ref 26.0–34.0)
MCHC: 36.8 g/dL — ABNORMAL HIGH (ref 30.0–36.0)
MCV: 83.6 fL (ref 78.0–100.0)
Platelets: 197 10*3/uL (ref 150–400)
RBC: 3.41 MIL/uL — ABNORMAL LOW (ref 4.22–5.81)
RDW: 13.4 % (ref 11.5–15.5)
WBC: 7.5 10*3/uL (ref 4.0–10.5)

## 2014-03-31 LAB — BASIC METABOLIC PANEL
BUN: 26 mg/dL — ABNORMAL HIGH (ref 6–23)
BUN: 23 mg/dL (ref 6–23)
CHLORIDE: 97 meq/L (ref 96–112)
CHLORIDE: 96 meq/L (ref 96–112)
CO2: 22 mEq/L (ref 19–32)
CO2: 21 mEq/L (ref 19–32)
Calcium: 8.1 mg/dL — ABNORMAL LOW (ref 8.4–10.5)
Calcium: 7.9 mg/dL — ABNORMAL LOW (ref 8.4–10.5)
Creatinine, Ser: 1.74 mg/dL — ABNORMAL HIGH (ref 0.50–1.35)
Creatinine, Ser: 1.62 mg/dL — ABNORMAL HIGH (ref 0.50–1.35)
GFR calc Af Amer: 63 mL/min — ABNORMAL LOW (ref >90)
GFR calc non Af Amer: 50 mL/min — ABNORMAL LOW (ref >90)
GFR calc non Af Amer: 54 mL/min — ABNORMAL LOW (ref >90)
GFR, EST AFRICAN AMERICAN: 58 mL/min — AB (ref >90)
GLUCOSE: 146 mg/dL — AB (ref 70–99)
Glucose, Bld: 102 mg/dL — ABNORMAL HIGH (ref 70–99)
POTASSIUM: 4.3 meq/L (ref 3.7–5.3)
Potassium: 5.1 mEq/L (ref 3.7–5.3)
Sodium: 130 mEq/L — ABNORMAL LOW (ref 137–147)
Sodium: 127 mEq/L — ABNORMAL LOW (ref 137–147)

## 2014-03-31 LAB — INFLUENZA PANEL BY PCR (TYPE A & B)
H1N1FLUPCR: NOT DETECTED
INFLAPCR: NEGATIVE
INFLBPCR: NEGATIVE

## 2014-03-31 MED ORDER — EMTRICITABINE-TENOFOVIR DF 200-300 MG PO TABS
1.0000 | ORAL_TABLET | Freq: Every day | ORAL | Status: DC
  Filled 2014-03-31: qty 1

## 2014-03-31 MED ORDER — OXYCODONE HCL 5 MG PO TABS
5.0000 mg | ORAL_TABLET | ORAL | Status: DC | PRN
  Administered 2014-04-01 – 2014-04-02 (×2): 5 mg via ORAL
  Filled 2014-03-31 (×2): qty 1

## 2014-03-31 MED ORDER — SULFAMETHOXAZOLE-TMP DS 800-160 MG PO TABS
1.0000 | ORAL_TABLET | Freq: Every day | ORAL | Status: DC
  Filled 2014-03-31: qty 1

## 2014-03-31 MED ORDER — SULFAMETHOXAZOLE-TMP DS 800-160 MG PO TABS
1.0000 | ORAL_TABLET | Freq: Once | ORAL | Status: AC
  Administered 2014-03-31: 1 via ORAL
  Filled 2014-03-31: qty 1

## 2014-03-31 MED ORDER — IBUPROFEN 400 MG PO TABS
400.0000 mg | ORAL_TABLET | Freq: Once | ORAL | Status: AC
  Administered 2014-03-31: 400 mg via ORAL
  Filled 2014-03-31: qty 1

## 2014-03-31 MED ORDER — EMTRICITABINE-TENOFOVIR DF 200-300 MG PO TABS: 1.0000 | ORAL_TABLET | ORAL | Status: DC

## 2014-03-31 MED ORDER — SULFAMETHOXAZOLE-TMP DS 800-160 MG PO TABS: 1.0000 | ORAL_TABLET | ORAL | Status: DC

## 2014-03-31 MED ORDER — ABACAVIR SULFATE 300 MG PO TABS
600.0000 mg | ORAL_TABLET | Freq: Every day | ORAL | Status: DC
  Administered 2014-03-31 – 2014-04-01 (×2): 600 mg via ORAL
  Filled 2014-03-31 (×2): qty 2

## 2014-03-31 MED ORDER — AZITHROMYCIN 600 MG PO TABS
1200.0000 mg | ORAL_TABLET | ORAL | Status: DC
  Administered 2014-04-02: 1200 mg via ORAL
  Filled 2014-03-31: qty 2

## 2014-03-31 MED ORDER — PREDNISONE 20 MG PO TABS
40.0000 mg | ORAL_TABLET | Freq: Two times a day (BID) | ORAL | Status: DC
  Administered 2014-03-31 – 2014-04-03 (×7): 40 mg via ORAL
  Filled 2014-03-31 (×9): qty 2

## 2014-03-31 MED ORDER — RALTEGRAVIR POTASSIUM 400 MG PO TABS
400.0000 mg | ORAL_TABLET | Freq: Two times a day (BID) | ORAL | Status: DC
  Administered 2014-03-31 – 2014-04-03 (×7): 400 mg via ORAL
  Filled 2014-03-31 (×9): qty 1

## 2014-03-31 MED ORDER — ONDANSETRON HCL 4 MG/2ML IJ SOLN: 4.0000 mg | Freq: Four times a day (QID) | INTRAMUSCULAR | Status: DC | PRN

## 2014-03-31 MED ORDER — SODIUM CHLORIDE 0.9 % IV SOLN
INTRAVENOUS | Status: DC
  Administered 2014-03-31: 01:00:00 via INTRAVENOUS

## 2014-03-31 MED ORDER — HYDROMORPHONE HCL PF 1 MG/ML IJ SOLN: 0.5000 mg | INTRAMUSCULAR | Status: DC | PRN

## 2014-03-31 MED ORDER — ENOXAPARIN SODIUM 40 MG/0.4ML ~~LOC~~ SOLN
40.0000 mg | SUBCUTANEOUS | Status: DC
  Administered 2014-03-31 – 2014-04-02 (×3): 40 mg via SUBCUTANEOUS
  Filled 2014-03-31 (×4): qty 0.4

## 2014-03-31 MED ORDER — ALUM & MAG HYDROXIDE-SIMETH 200-200-20 MG/5ML PO SUSP
30.0000 mL | Freq: Four times a day (QID) | ORAL | Status: DC | PRN
Start: 2014-03-31 — End: 2014-04-03
  Administered 2014-04-01: 30 mL via ORAL
  Filled 2014-03-31: qty 30

## 2014-03-31 MED ORDER — LAMIVUDINE 150 MG PO TABS
150.0000 mg | ORAL_TABLET | Freq: Every day | ORAL | Status: DC
  Administered 2014-03-31 – 2014-04-01 (×2): 150 mg via ORAL
  Filled 2014-03-31 (×2): qty 1

## 2014-03-31 MED ORDER — ALBUTEROL SULFATE (2.5 MG/3ML) 0.083% IN NEBU: 2.5000 mg | INHALATION_SOLUTION | Freq: Four times a day (QID) | RESPIRATORY_TRACT | Status: DC | PRN

## 2014-03-31 MED ORDER — ONDANSETRON HCL 4 MG PO TABS: 4.0000 mg | ORAL_TABLET | Freq: Four times a day (QID) | ORAL | Status: DC | PRN

## 2014-03-31 MED ORDER — DEXTROSE 5 % IV SOLN
15.0000 mg/kg/d | Freq: Three times a day (TID) | INTRAVENOUS | Status: DC
  Administered 2014-03-31 – 2014-04-03 (×10): 296 mg via INTRAVENOUS
  Filled 2014-03-31 (×13): qty 18.5

## 2014-03-31 MED ORDER — HYDROMORPHONE HCL PF 1 MG/ML IJ SOLN: 1.0000 mg | INTRAMUSCULAR | Status: DC | PRN

## 2014-03-31 MED ORDER — ALBUTEROL SULFATE HFA 108 (90 BASE) MCG/ACT IN AERS: 2.0000 | INHALATION_SPRAY | Freq: Four times a day (QID) | RESPIRATORY_TRACT | Status: DC | PRN

## 2014-03-31 NOTE — Consult Note (Signed)
[facility]    Date of Admission:  03/30/2014  Date of Consult:  03/31/2014  Reason for Consult: fever, N, V, worsening airspace disease in pt with HIV/AIDS Referring Physician: Dr. Daleen Bo   HPI: Rodney Walker is an 32 y.o. male recently diagnosed with HIV/AIDS and seen in my clinic recently. I started him on isentress and truvada and had been treating him for oral candidiasis. Unfortunately he has been now suffering from persistent fevers nausea malaise, vomiting, and DOE  He came to ED where found to be with ARI, hyponatremia and worsening infiltrates on CXR     Past Medical History  Diagnosis Date  . Chest pain 2010  . Hyperlipidemia   . Coronary artery disease     SPONTANEOUS DISSECTION AND PLAQUE RUPTURE OF HIS LEFT ANTERIOR DESCENDING ARTERY  . MI (myocardial infarction)   . HIV infection     Past Surgical History  Procedure Laterality Date  . Cardiac catheterization  06/24/2009    EF 60%  . Cardiac catheterization  06/17/2009    EF 45%. ANTERIOR HYPOKINESIS  . US echocardiography  12/09/2009    EF 55-60%  ergies:   No Known Allergies   Medications: I have reviewed patients current medications as documented in Epic Anti-infectives   Start     Dose/Rate Route Frequency Ordered Stop   04/02/14 1000  azithromycin (ZITHROMAX) tablet 1,200 mg     1,200 mg Oral Weekly 03/31/14 0042     04/01/14 0000  sulfamethoxazole-trimethoprim (BACTRIM DS) 800-160 MG per tablet 1 tablet  Status:  Discontinued     1 tablet Oral Once per day on Mon Wed Fri 03/31/14 0733 03/31/14 0832   03/31/14 1800  fluconazole (DIFLUCAN) IVPB 200 mg  Status:  Discontinued     200 mg 100 mL/hr over 60 Minutes Intravenous Every 24 hours 03/30/14 2358 03/30/14 2359   03/31/14 1000  emtricitabine-tenofovir (TRUVADA) 200-300 MG per tablet 1 tablet  Status:  Discontinued     1 tablet Oral Daily 03/31/14 0042 03/31/14 0733   03/31/14 1000  raltegravir (ISENTRESS)  tablet 400 mg     400 mg Oral 2 times daily 03/31/14 0042     03/31/14 1000  sulfamethoxazole-trimethoprim (BACTRIM DS) 800-160 MG per tablet 1 tablet  Status:  Discontinued     1 tablet Oral Daily 03/31/14 0055 03/31/14 0733   03/31/14 1000  emtricitabine-tenofovir (TRUVADA) 200-300 MG per tablet 1 tablet  Status:  Discontinued     1 tablet Oral Every other day 03/31/14 0733 03/31/14 0736   03/31/14 1000  abacavir (ZIAGEN) tablet 600 mg     600 mg Oral Daily 03/31/14 0736     03/31/14 1000  lamiVUDine (EPIVIR) tablet 150 mg     150 mg Oral Daily 03/31/14 0738     03/31/14 0900  sulfamethoxazole-trimethoprim (BACTRIM) 296 mg in dextrose 5 % 250 mL IVPB     15 mg/kg/day  59.1 kg 268.5 mL/hr over 60 Minutes Intravenous 3 times per day 03/31/14 0832     03/31/14 0045  sulfamethoxazole-trimethoprim (BACTRIM DS) 800-160 MG per tablet 1 tablet     1 tablet Oral  Once 03/31/14 0042 03/31/14 0056   03/30/14 2345  fluconazole (DIFLUCAN) IVPB 200 mg     200 mg 100 mL/hr over 60 Minutes Intravenous Daily at bedtime 03/30/14 2338        Social History:  reports that he has been smoking Cigarettes.  He has been smoking about  0.00 packs per day. He does not have any smokeless tobacco history on file. He reports that he drinks alcohol. He reports that he does not use illicit drugs.  Family History  Problem Relation Age of Onset  . Diabetes Mother   . Liver cancer Father     As in HPI and primary teams notes otherwise 12 point review of systems is negative  Blood pressure 116/65, pulse 86, temperature 98.4 F (36.9 C), temperature source Oral, resp. rate 16, height 5' 5"  (1.651 m), weight 130 lb 4.7 oz (59.1 kg), SpO2 96.00%. General: Alert and awake, oriented x3, drenched in sweat HEENT: anicteric sclera, , EOMI, oropharynx is clearing up from thrush  CVS tachycardic, normal r,  no murmur rubs or gallops Chest: clear to auscultation bilaterally, no wheezing, rales or rhonchi Abdomen: soft  nontender, nondistended, normal bowel sounds, Extremities: no  clubbing or edema noted bilaterally Skin: no rashes Neuro: nonfocal, strength and sensation intact   Results for orders placed during the hospital encounter of 03/30/14 (from the past 48 hour(s))  URINALYSIS, ROUTINE W REFLEX MICROSCOPIC     Status: Abnormal   Collection Time    03/30/14  9:50 PM      Result Value Ref Range   Color, Urine YELLOW  YELLOW   APPearance CLEAR  CLEAR   Specific Gravity, Urine 1.022  1.005 - 1.030   pH 6.0  5.0 - 8.0   Glucose, UA NEGATIVE  NEGATIVE mg/dL   Hgb urine dipstick MODERATE (*) NEGATIVE   Bilirubin Urine NEGATIVE  NEGATIVE   Ketones, ur NEGATIVE  NEGATIVE mg/dL   Protein, ur >300 (*) NEGATIVE mg/dL   Urobilinogen, UA 0.2  0.0 - 1.0 mg/dL   Nitrite NEGATIVE  NEGATIVE   Leukocytes, UA NEGATIVE  NEGATIVE  URINE MICROSCOPIC-ADD ON     Status: None   Collection Time    03/30/14  9:50 PM      Result Value Ref Range   Squamous Epithelial / LPF RARE  RARE   WBC, UA 0-2  <3 WBC/hpf   RBC / HPF 0-2  <3 RBC/hpf  CBC WITH DIFFERENTIAL     Status: Abnormal   Collection Time    03/30/14 10:02 PM      Result Value Ref Range   WBC 8.0  4.0 - 10.5 K/uL   RBC 4.01 (*) 4.22 - 5.81 MIL/uL   Hemoglobin 12.3 (*) 13.0 - 17.0 g/dL   HCT 33.3 (*) 39.0 - 52.0 %   MCV 83.0  78.0 - 100.0 fL   MCH 30.7  26.0 - 34.0 pg   MCHC 36.9 (*) 30.0 - 36.0 g/dL   RDW 13.2  11.5 - 15.5 %   Platelets 249  150 - 400 K/uL   Neutrophils Relative % 80 (*) 43 - 77 %   Neutro Abs 6.4  1.7 - 7.7 K/uL   Lymphocytes Relative 7 (*) 12 - 46 %   Lymphs Abs 0.5 (*) 0.7 - 4.0 K/uL   Monocytes Relative 8  3 - 12 %   Monocytes Absolute 0.7  0.1 - 1.0 K/uL   Eosinophils Relative 4  0 - 5 %   Eosinophils Absolute 0.3  0.0 - 0.7 K/uL   Basophils Relative 0  0 - 1 %   Basophils Absolute 0.0  0.0 - 0.1 K/uL  COMPREHENSIVE METABOLIC PANEL     Status: Abnormal   Collection Time    03/30/14 10:02 PM      Result Value  Ref  Range   Sodium 127 (*) 137 - 147 mEq/L   Potassium 4.3  3.7 - 5.3 mEq/L   Chloride 90 (*) 96 - 112 mEq/L   CO2 22  19 - 32 mEq/L   Glucose, Bld 116 (*) 70 - 99 mg/dL   BUN 23  6 - 23 mg/dL   Creatinine, Ser 1.64 (*) 0.50 - 1.35 mg/dL   Calcium 9.5  8.4 - 10.5 mg/dL   Total Protein 8.4 (*) 6.0 - 8.3 g/dL   Albumin 2.8 (*) 3.5 - 5.2 g/dL   AST 17  0 - 37 U/L   ALT 15  0 - 53 U/L   Alkaline Phosphatase 72  39 - 117 U/L   Total Bilirubin 0.3  0.3 - 1.2 mg/dL   GFR calc non Af Amer 54 (*) >90 mL/min   GFR calc Af Amer 62 (*) >90 mL/min   Comment: (NOTE)     The eGFR has been calculated using the CKD EPI equation.     This calculation has not been validated in all clinical situations.     eGFR's persistently <90 mL/min signify possible Chronic Kidney     Disease.  LIPASE, BLOOD     Status: None   Collection Time    03/30/14 10:02 PM      Result Value Ref Range   Lipase 25  11 - 59 U/L  LACTIC ACID, PLASMA     Status: None   Collection Time    03/30/14 10:02 PM      Result Value Ref Range   Lactic Acid, Venous 1.2  0.5 - 2.2 mmol/L  RAPID STREP SCREEN     Status: None   Collection Time    03/30/14 10:14 PM      Result Value Ref Range   Streptococcus, Group A Screen (Direct) NEGATIVE  NEGATIVE   Comment: (NOTE)     A Rapid Antigen test may result negative if the antigen level in the     sample is below the detection level of this test. The FDA has not     cleared this test as a stand-alone test therefore the rapid antigen     negative result has reflexed to a Group A Strep culture.  INFLUENZA PANEL BY PCR (TYPE A & B, H1N1)     Status: None   Collection Time    03/30/14 11:53 PM      Result Value Ref Range   Influenza A By PCR NEGATIVE  NEGATIVE   Influenza B By PCR NEGATIVE  NEGATIVE   H1N1 flu by pcr NOT DETECTED  NOT DETECTED   Comment:            The Xpert Flu assay (FDA approved for     nasal aspirates or washes and     nasopharyngeal swab specimens), is     intended  as an aid in the diagnosis of     influenza and should not be used as     a sole basis for treatment.     Performed at [place] PANEL     Status: Abnormal   Collection Time    03/31/14  5:40 AM      Result Value Ref Range   Sodium 130 (*) 137 - 147 mEq/L   Potassium 5.1  3.7 - 5.3 mEq/L   Chloride 97  96 - 112 mEq/L   CO2 22  19 - 32 mEq/L   Glucose,  Bld 102 (*) 70 - 99 mg/dL   BUN 26 (*) 6 - 23 mg/dL   Creatinine, Ser 1.74 (*) 0.50 - 1.35 mg/dL   Calcium 8.1 (*) 8.4 - 10.5 mg/dL   GFR calc non Af Amer 50 (*) >90 mL/min   GFR calc Af Amer 58 (*) >90 mL/min   Comment: (NOTE)     The eGFR has been calculated using the CKD EPI equation.     This calculation has not been validated in all clinical situations.     eGFR's persistently <90 mL/min signify possible Chronic Kidney     Disease.  CBC     Status: Abnormal   Collection Time    03/31/14  5:40 AM      Result Value Ref Range   WBC 7.5  4.0 - 10.5 K/uL   RBC 3.41 (*) 4.22 - 5.81 MIL/uL   Hemoglobin 10.5 (*) 13.0 - 17.0 g/dL   HCT 28.5 (*) 39.0 - 52.0 %   MCV 83.6  78.0 - 100.0 fL   MCH 30.8  26.0 - 34.0 pg   MCHC 36.8 (*) 30.0 - 36.0 g/dL   RDW 13.4  11.5 - 15.5 %   Platelets 197  150 - 400 K/uL  BASIC METABOLIC PANEL     Status: Abnormal   Collection Time    03/31/14 11:26 AM      Result Value Ref Range   Sodium 127 (*) 137 - 147 mEq/L   Potassium 4.3  3.7 - 5.3 mEq/L   Chloride 96  96 - 112 mEq/L   CO2 21  19 - 32 mEq/L   Glucose, Bld 146 (*) 70 - 99 mg/dL   BUN 23  6 - 23 mg/dL   Creatinine, Ser 1.62 (*) 0.50 - 1.35 mg/dL   Calcium 7.9 (*) 8.4 - 10.5 mg/dL   GFR calc non Af Amer 54 (*) >90 mL/min   GFR calc Af Amer 63 (*) >90 mL/min   Comment: (NOTE)     The eGFR has been calculated using the CKD EPI equation.     This calculation has not been validated in all clinical situations.     eGFR's persistently <90 mL/min signify possible Chronic Kidney     Disease.   No results found for  this basename: sdes, specrequest, cult, reptstatus   Dg Abd Acute W/chest  03/30/2014   CLINICAL DATA:  Nausea and vomiting.  EXAM: ACUTE ABDOMEN SERIES (ABDOMEN 2 VIEW & CHEST 1 VIEW)  COMPARISON:  Chest radiograph performed 03/18/2014, and CT of the abdomen and pelvis performed 03/19/2014  FINDINGS: The lungs are well-aerated. Worsening bilateral fluffy airspace opacification raises concern for multifocal pneumonia, though an inflammatory process might have a similar appearance. No pleural effusion or pneumothorax is seen. The cardiomediastinal silhouette is within normal limits. Prominent apical bulla are seen bilaterally.  The visualized bowel gas pattern is unremarkable. Scattered stool and air are seen within the colon; there is no evidence of small bowel dilatation to suggest obstruction. No free intra-abdominal air is identified on the provided upright view.  No acute osseous abnormalities are seen; the sacroiliac joints are unremarkable in appearance.  IMPRESSION: 1. Worsening bilateral fluffy airspace opacification raises concern for multifocal pneumonia, although an inflammatory process might have a similar appearance. 2. Prominent biapical bulla again noted. 3. Unremarkable bowel gas pattern; no free intra-abdominal air seen.   Electronically Signed   By: Garald Balding M.D.   On: 03/30/2014 23:37  Recent Results (from the past 720 hour(s))  RAPID STREP SCREEN     Status: None   Collection Time    03/30/14 10:14 PM      Result Value Ref Range Status   Streptococcus, Group A Screen (Direct) NEGATIVE  NEGATIVE Final   Comment: (NOTE)     A Rapid Antigen test may result negative if the antigen level in the     sample is below the detection level of this test. The FDA has not     cleared this test as a stand-alone test therefore the rapid antigen     negative result has reflexed to a Group A Strep culture.     Impression/Recommendation  Principal Problem:   Fever Active Problems:    CAD (coronary artery disease)   AIDS   Thrush   Pharyngitis   Hyponatremia   Rodney Walker is a 33 y.o. male with HIV/AIDS, oral candidiasis admitted with AKI, worsening infiltrates on CXR  #1 Possible PCP pneumonia: --continue IV bactrim with prednisone --o2  #2 Thrush: --continue fluconazole   #3 Dysphagia: seems improved, but if worsens consult GI for EGD to look for CMV infectino vs HSV esophagitis  #4 AKI: Looks more like prerenal failure but stopped his TNF for now and replaced with ABC and 3tc. Will switch back to Truvada when improves. I dont think he should be on ABC regimen given hx of CAD  #5 HIV/AIDS: see above and will continue isentress, ABC 3tc, rx pcp and give M avium prophylaxis  #6 Bullous lung disease: is this all from PCP or does he have other reason for such changes such as alpha antitrypsin deficiency?    03/31/2014, 3:42 PM   Thank you so much for this interesting consult  [facility] [place] [phone] (pager) [phone] (office) 03/31/2014, 3:42 PM  Rhina [place] 03/31/2014, 3:42 PM

## 2014-03-31 NOTE — Progress Notes (Signed)
[facility] PROGRESS NOTE  Rodney FaceJonathan Walker Neu AOZ:[id number] DOB: Sep 05, 1981 DOA: 03/30/2014 PCP: No PCP Per Patient  Assessment/Plan: Fever  Active Problems:  Hyponatremia  AIDS  Thrush  Pharyngitis  CAD (coronary artery disease)  33 y.o. male with recently Diagnosed HIV/AIDS on HAART Rx for the past 3 weeks who presents to the ED with complaints if fevers, chills, sore throat a and nausea and vomiting x 3 days  1. Pneumonia/sirs/sepsis immunosuppressed with aids; suspicious PCP -CXR: Worsening bilateral fluffy airspace opacification raises concern for multifocal pneumonia -defer management to ID, appreciate the input, pend cultures, check legionella   2. AKI with hyponatremia likely dehydration;  -? Bactrim effect; cont IVFs; monitor lytes;  3. HIV/AIDS; management per ID  4. Oral Thrush; IV Fluconazole; denies dysphasia;  -if not improved may need EGD  5. CAD- Stable.      Code Status: full Family Communication:  D/w patient, his friend (indicate person spoken with, relationship, and if by phone, the number) Disposition Plan: home pend clinical improvement    Consultants:  ID  Procedures:  none  Antibiotics:  Fluconazole 4/3>>>>  Zosyn 4/4<<<<<  Azithromycin 4/3<<<<<   (indicate start date, and stop date if known)  HPI/Subjective: Alert  Objective: Filed Vitals:   03/31/14 0604  BP: 119/89  Pulse: 103  Temp: 102.7 F (39.3 C)  Resp: 16    Intake/Output Summary (Last 24 hours) at 03/31/14 1018 Last data filed at 03/31/14 0042  Gross per 24 hour  Intake      0 ml  Output      1 ml  Net     -1 ml   Filed Weights   03/31/14 0035  Weight: 59.1 kg (130 lb 4.7 oz)    Exam:   General:  alert  Cardiovascular: s1,s2 rrr  Respiratory: few rales   Abdomen: soft, nt,nd   Musculoskeletal: no LE edema    Data Reviewed: Basic Metabolic Panel:  Recent Labs Lab 03/30/14 2202 03/31/14 0540  NA 127* 130*  K 4.3 5.1  CL 90*  97  CO2 22 22  GLUCOSE 116* 102*  BUN 23 26*  CREATININE 1.64* 1.74*  CALCIUM 9.5 8.1*   Liver Function Tests:  Recent Labs Lab 03/30/14 2202  AST 17  ALT 15  ALKPHOS 72  BILITOT 0.3  PROT 8.4*  ALBUMIN 2.8*    Recent Labs Lab 03/30/14 2202  LIPASE 25   No results found for this basename: AMMONIA,  in the last 168 hours CBC:  Recent Labs Lab 03/30/14 2202 03/31/14 0540  WBC 8.0 7.5  NEUTROABS 6.4  --   HGB 12.3* 10.5*  HCT 33.3* 28.5*  MCV 83.0 83.6  PLT 249 197   Cardiac Enzymes: No results found for this basename: CKTOTAL, CKMB, CKMBINDEX, TROPONINI,  in the last 168 hours BNP (last 3 results)  Recent Labs  03/18/14 1130  PROBNP 60.8   CBG: No results found for this basename: GLUCAP,  in the last 168 hours  Recent Results (from the past 240 hour(s))  RAPID STREP SCREEN     Status: None   Collection Time    03/30/14 10:14 PM      Result Value Ref Range Status   Streptococcus, Group A Screen (Direct) NEGATIVE  NEGATIVE Final   Comment: (NOTE)     A Rapid Antigen test may result negative if the antigen level in the     sample is below the detection level of this test. The FDA has not  cleared this test as a stand-alone test therefore the rapid antigen     negative result has reflexed to a Group A Strep culture.     Studies: Dg Abd Acute W/chest  03/30/2014   CLINICAL DATA:  Nausea and vomiting.  EXAM: ACUTE ABDOMEN SERIES (ABDOMEN 2 VIEW & CHEST 1 VIEW)  COMPARISON:  Chest radiograph performed 03/18/2014, and CT of the abdomen and pelvis performed 03/19/2014  FINDINGS: The lungs are well-aerated. Worsening bilateral fluffy airspace opacification raises concern for multifocal pneumonia, though an inflammatory process might have a similar appearance. No pleural effusion or pneumothorax is seen. The cardiomediastinal silhouette is within normal limits. Prominent apical bulla are seen bilaterally.  The visualized bowel gas pattern is unremarkable.  Scattered stool and air are seen within the colon; there is no evidence of small bowel dilatation to suggest obstruction. No free intra-abdominal air is identified on the provided upright view.  No acute osseous abnormalities are seen; the sacroiliac joints are unremarkable in appearance.  IMPRESSION: 1. Worsening bilateral fluffy airspace opacification raises concern for multifocal pneumonia, although an inflammatory process might have a similar appearance. 2. Prominent biapical bulla again noted. 3. Unremarkable bowel gas pattern; no free intra-abdominal air seen.   Electronically Signed   By: Roanna Raider M.D.   On: 03/30/2014 23:37    Scheduled Meds: . abacavir  600 mg Oral Daily  . [START ON 04/02/2014] azithromycin  1,200 mg Oral Weekly  . enoxaparin (LOVENOX) injection  40 mg Subcutaneous Q24H  . fluconazole (DIFLUCAN) IV  200 mg Intravenous QHS  . lamiVUDine  150 mg Oral Daily  . predniSONE  40 mg Oral BID WC  . raltegravir  400 mg Oral BID  . sulfamethoxazole-trimethoprim  15 mg/kg/day Intravenous 3 times per day   Continuous Infusions: . sodium chloride 125 mL/hr at 03/31/14 0009  . sodium chloride 100 mL/hr at 03/31/14 0056    Principal Problem:   Fever Active Problems:   CAD (coronary artery disease)   AIDS   Thrush   Pharyngitis   Hyponatremia    Time spent: >35 minutes     Esperanza Sheets  [facility] Pager [phone]. If 7PM-7AM, please contact night-coverage at [email or web address], password [facility] 03/31/2014, 10:18 AM  LOS: 1 day

## 2014-03-31 NOTE — H&P (Signed)
[facility] History and Physical  Rodney GrillsJonathan Everette Solomon ZOX:[id number] DOB: 1981-06-05 DOA: 03/30/2014  Referring physician:  PCP: No PCP Per Patient  Specialists:   Chief Complaint: Fevers Sore Throat Nausea And Vomiting  HPI: Rodney Walker is a 33 y.o. male with recently Diagnosed HIV/AIDS on HAART Rx for the past 3 weeks who presents to the ED with complaints if fevers, chills, sore throat a and nausea and vomiting x 3 days.   He denies any diarrhea.   He was evaluated in the ED and was found to have a negative chest X-Ray, and the EDP spoke to ID on Call who recommended placing the patient on IV Fluconazole and IVFs for rehydration.     Review of Systems:  Constitutional: No Weight Loss, No Weight Gain, Night Sweats, +Fevers, +Chills, +Fatigue,  Generalized Weakness HEENT: No Headaches, Difficulty Swallowing,Tooth/Dental Problems, +Sore Throat,  No Sneezing, Rhinitis, Ear Ache, Nasal Congestion, or Post Nasal Drip,  Cardio-vascular:  No Chest pain, Orthopnea, PND, Edema in lower extremities, Anasarca, Dizziness, Palpitations  Resp: No Dyspnea, No DOE, No Cough, No Hemoptysis, No Wheezing.    GI: No Heartburn, Indigestion, Abdominal Pain, +Nausea, +Vomiting, No Diarrhea, Change in Bowel Habits,  Loss of Appetite  GU: No Dysuria, Change in Color of Urine, No Urgency or Frequency.  No flank pain.  Musculoskeletal: No Joint Pain or Swelling.  No Decreased Range of Motion. No Back Pain.  Neurologic: No Syncope, No Seizures, Muscle Weakness, Paresthesia, Vision Disturbance or Loss, No Diplopia, No Vertigo, No Difficulty Walking,  Skin: No Rash or Lesions. Psych: No Change in Mood or Affect. No Depression or Anxiety. No Memory loss. No Confusion or Hallucinations   Past Medical History  Diagnosis Date  . Chest pain 2010  . Hyperlipidemia   . Coronary artery disease     SPONTANEOUS DISSECTION AND PLAQUE RUPTURE OF HIS LEFT ANTERIOR DESCENDING ARTERY  . MI (myocardial  infarction)   . HIV infection       Past Surgical History  Procedure Laterality Date  . Cardiac catheterization  06/24/2009    EF 60%  . Cardiac catheterization  06/17/2009    EF 45%. ANTERIOR HYPOKINESIS  . [place] echocardiography  12/09/2009    EF 55-60%       Prior to Admission medications   Medication Sig Start Date End Date Taking? Authorizing Provider  albuterol (PROVENTIL HFA;VENTOLIN HFA) 108 (90 BASE) MCG/ACT inhaler Inhale 2 puffs into the lungs every 6 (six) hours as needed for wheezing or shortness of breath.   Yes Historical Provider, MD  azithromycin (ZITHROMAX) 600 MG tablet Take 2 tablets (1,200 mg total) by mouth once a week. 03/29/14  Yes Randall Hissornelius N Van Dam, MD  emtricitabine-tenofovir (TRUVADA) 200-300 MG per tablet Take 1 tablet by mouth daily. 03/29/14  Yes Randall Hissornelius N Van Dam, MD  fluconazole (DIFLUCAN) 100 MG tablet Take 100 mg by mouth daily.   Yes Historical Provider, MD  raltegravir (ISENTRESS) 400 MG tablet Take 1 tablet (400 mg total) by mouth 2 (two) times daily. 03/29/14  Yes Randall Hissornelius N Van Dam, MD  sulfamethoxazole-trimethoprim [facility](SEPTRA DS) 800-160 MG per tablet Take 1 tablet by mouth once. 03/29/14  Yes Randall Hissornelius N Van Dam, MD      No Known Allergies   Social History:  reports that he has been smoking Cigarettes.  He has been smoking about 0.00 packs per day. He does not have any smokeless tobacco history on file. He reports that he drinks alcohol. He reports that  he does not use illicit drugs.     Family History  Problem Relation Age of Onset  . Diabetes Mother   . Liver cancer Father        Physical Exam:  GEN:  Pleasant Thin Well Nourished Well Developed  33 y.o.  Caucasian male examined  and in no acute distress; cooperative with exam Filed Vitals:   03/30/14 2110 03/30/14 2209 03/30/14 2300 03/31/14 0005  BP: 128/63   110/62  Pulse: 99   87  Temp: 99.9 F (37.7 C) 99.3 F (37.4 C) 99.8 F (37.7 C) 97.8 F (36.6 C)  TempSrc: Oral Oral  Rectal Oral  Resp: 26   13  SpO2: 97%   95%   Blood pressure 110/62, pulse 87, temperature 97.8 F (36.6 C), temperature source Oral, resp. rate 13, SpO2 95.00%. PSYCH: He is alert and oriented x4; does not appear anxious does not appear depressed; affect is normal HEENT: Normocephalic and Atraumatic, Mucous membranes pink; PERRLA; EOM intact; Fundi:  Benign;  No scleral icterus, Nares: Patent, Oropharynx: Clear, Fair Dentition, Neck:  FROM, no cervical lymphadenopathy nor thyromegaly or carotid bruit; no JVD; Breasts:: Not examined CHEST WALL: No tenderness CHEST: Normal respiration, clear to auscultation bilaterally HEART: Regular rate and rhythm; no murmurs rubs or gallops BACK: No kyphosis or scoliosis; no CVA tenderness ABDOMEN: Positive Bowel Sounds, Scaphoid, soft non-tender; no masses Rectal Exam: Not done EXTREMITIES: No cyanosis, clubbing or edema; no ulcerations. Genitalia: not examined PULSES: 2+ and symmetric SKIN: Normal hydration no rash or ulceration CNS:  Alert and Oriented X 4 No Focal Deficits  Vascular: pulses palpable throughout    Labs on Admission:  Basic Metabolic Panel:  Recent Labs Lab 03/30/14 2202  NA 127*  K 4.3  CL 90*  CO2 22  GLUCOSE 116*  BUN 23  CREATININE 1.64*  CALCIUM 9.5   Liver Function Tests:  Recent Labs Lab 03/30/14 2202  AST 17  ALT 15  ALKPHOS 72  BILITOT 0.3  PROT 8.4*  ALBUMIN 2.8*    Recent Labs Lab 03/30/14 2202  LIPASE 25   No results found for this basename: AMMONIA,  in the last 168 hours CBC:  Recent Labs Lab 03/30/14 2202  WBC 8.0  NEUTROABS 6.4  HGB 12.3*  HCT 33.3*  MCV 83.0  PLT 249   Cardiac Enzymes: No results found for this basename: CKTOTAL, CKMB, CKMBINDEX, TROPONINI,  in the last 168 hours  BNP (last 3 results)  Recent Labs  03/18/14 1130  PROBNP 60.8   CBG: No results found for this basename: GLUCAP,  in the last 168 hours  Radiological Exams on Admission: Dg Abd Acute  W/chest  03/30/2014   CLINICAL DATA:  Nausea and vomiting.  EXAM: ACUTE ABDOMEN SERIES (ABDOMEN 2 VIEW & CHEST 1 VIEW)  COMPARISON:  Chest radiograph performed 03/18/2014, and CT of the abdomen and pelvis performed 03/19/2014  FINDINGS: The lungs are well-aerated. Worsening bilateral fluffy airspace opacification raises concern for multifocal pneumonia, though an inflammatory process might have a similar appearance. No pleural effusion or pneumothorax is seen. The cardiomediastinal silhouette is within normal limits. Prominent apical bulla are seen bilaterally.  The visualized bowel gas pattern is unremarkable. Scattered stool and air are seen within the colon; there is no evidence of small bowel dilatation to suggest obstruction. No free intra-abdominal air is identified on the provided upright view.  No acute osseous abnormalities are seen; the sacroiliac joints are unremarkable in appearance.  IMPRESSION: 1. Worsening bilateral  fluffy airspace opacification raises concern for multifocal pneumonia, although an inflammatory process might have a similar appearance. 2. Prominent biapical bulla again noted. 3. Unremarkable bowel gas pattern; no free intra-abdominal air seen.   Electronically Signed   By: Roanna Raider M.D.   On: 03/30/2014 23:37     Assessment/Plan:   33 y.o. male with  Principal Problem:   Fever Active Problems:   Hyponatremia   AIDS   Thrush   Pharyngitis   CAD (coronary artery disease)      1.   Fever-  Blood Culture X 2 sent, and Urine for C+S.  NO Broad Spectrum Abx started per recommendation of ID.    2.   Hyponatremia-  IVFs  With NSS, Monitor Na+ Levels.    3.   AIDS- continue HAART Rx, ID to See in AM.     4.   Oral Thrush-  IV Fluconazole.    5.   Pharyngitis-   Due to #4, considee if Throat Culture needed.     6..  CAD-  Stable.        Code Status:   FULL CODE    Family Communication:  No Family Present   Disposition Plan:    Inpatient    Time spent:   60 Minutes  Villa Burgin C [facility] Pager [phone]  If 7PM-7AM, please contact night-coverage [email or web address] Password [facility] 03/31/2014, 12:28 AM

## 2014-03-31 NOTE — Progress Notes (Signed)
ANTIBIOTIC CONSULT NOTE - INITIAL  Pharmacy Consult for Bactrim Indication: r/o pneumocystis pneumonia  No Known Allergies  Patient Measurements: Height: 5\' 5"  (165.1 cm) Weight: 130 lb 4.7 oz (59.1 kg) IBW/kg (Calculated) : 61.5  Vital Signs: Temp: 102.7 F (39.3 C) (04/04 0604) Temp src: Oral (04/04 0604) BP: 119/89 mmHg (04/04 0604) Pulse Rate: 103 (04/04 0604) Intake/Output from previous day: 04/03 0701 - 04/04 0700 In: -  Out: 1 [Urine:1] Intake/Output from this shift:    Labs:  Recent Labs  03/30/14 2202 03/31/14 0540  WBC 8.0 7.5  HGB 12.3* 10.5*  PLT 249 197  CREATININE 1.64* 1.74*   Estimated Creatinine Clearance: 50.5 ml/min (by C-G formula based on Cr of 1.74). No results found for this basename: VANCOTROUGH, Leodis BinetVANCOPEAK, VANCORANDOM, GENTTROUGH, GENTPEAK, GENTRANDOM, TOBRATROUGH, TOBRAPEAK, TOBRARND, AMIKACINPEAK, AMIKACINTROU, AMIKACIN,  in the last 72 hours   Microbiology: Recent Results (from the past 720 hour(s))  RAPID STREP SCREEN     Status: None   Collection Time    03/30/14 10:14 PM      Result Value Ref Range Status   Streptococcus, Group A Screen (Direct) NEGATIVE  NEGATIVE Final   Comment: (NOTE)     A Rapid Antigen test may result negative if the antigen level in the     sample is below the detection level of this test. The FDA has not     cleared this test as a stand-alone test therefore the rapid antigen     negative result has reflexed to a Group A Strep culture.    Medical History: Past Medical History  Diagnosis Date  . Chest pain 2010  . Hyperlipidemia   . Coronary artery disease     SPONTANEOUS DISSECTION AND PLAQUE RUPTURE OF HIS LEFT ANTERIOR DESCENDING ARTERY  . MI (myocardial infarction)   . HIV infection     Medications:  Anti-infectives   Start     Dose/Rate Route Frequency Ordered Stop   04/02/14 1000  azithromycin (ZITHROMAX) tablet 1,200 mg     1,200 mg Oral Weekly 03/31/14 0042     04/01/14 0000   sulfamethoxazole-trimethoprim (BACTRIM DS) 800-160 MG per tablet 1 tablet     1 tablet Oral Once per day on Mon Wed Fri 03/31/14 0733     03/31/14 1800  fluconazole (DIFLUCAN) IVPB 200 mg  Status:  Discontinued     200 mg 100 mL/hr over 60 Minutes Intravenous Every 24 hours 03/30/14 2358 03/30/14 2359   03/31/14 1000  emtricitabine-tenofovir (TRUVADA) 200-300 MG per tablet 1 tablet  Status:  Discontinued     1 tablet Oral Daily 03/31/14 0042 03/31/14 0733   03/31/14 1000  raltegravir (ISENTRESS) tablet 400 mg     400 mg Oral 2 times daily 03/31/14 0042     03/31/14 1000  sulfamethoxazole-trimethoprim (BACTRIM DS) 800-160 MG per tablet 1 tablet  Status:  Discontinued     1 tablet Oral Daily 03/31/14 0055 03/31/14 0733   03/31/14 1000  emtricitabine-tenofovir (TRUVADA) 200-300 MG per tablet 1 tablet  Status:  Discontinued     1 tablet Oral Every other day 03/31/14 0733 03/31/14 0736   03/31/14 1000  abacavir (ZIAGEN) tablet 600 mg     600 mg Oral Daily 03/31/14 0736     03/31/14 1000  lamiVUDine (EPIVIR) tablet 150 mg     150 mg Oral Daily 03/31/14 0738     03/31/14 0045  sulfamethoxazole-trimethoprim (BACTRIM DS) 800-160 MG per tablet 1 tablet     1  tablet Oral  Once 03/31/14 0042 03/31/14 0056   03/30/14 2345  fluconazole (DIFLUCAN) IVPB 200 mg     200 mg 100 mL/hr over 60 Minutes Intravenous Daily at bedtime 03/30/14 2338       Assessment: 32yo M admitted 4/3 with fevers, chills, sore throat, N/V x 3 days. Recently diagnosed with HIV/AIDs and started HAART about 3 weeks ago. ID adjusted  HAART regimen and asked pharmacy to dose Bactrim for possible PCP. On home azithromycin. Home fluconazole increased to 200mg  IV qd.   Tmax: 102.7  WBCs: wnl  SCr 1.74, CrCl 73ml/min  Blood and urine cultures in process.  Sputum and Pneumocystis smear ordered.  Goal of Therapy:  Appropriate antibiotic dosing for renal function; eradication of infection  Plan:   DC oral Bactrim  order.  Bactrim 5mg /kg IV q8h.  Follow up renal fxn and culture results.  Charolotte Eke, PharmD, pager [phone]. 03/31/2014,8:30 AM.

## 2014-04-01 ENCOUNTER — Encounter: Payer: Self-pay | Admitting: Infectious Disease

## 2014-04-01 LAB — BASIC METABOLIC PANEL
BUN: 23 mg/dL (ref 6–23)
CO2: 19 meq/L (ref 19–32)
Calcium: 8.9 mg/dL (ref 8.4–10.5)
Chloride: 100 mEq/L (ref 96–112)
Creatinine, Ser: 1.18 mg/dL (ref 0.50–1.35)
GFR calc Af Amer: >90 mL/min (ref >90)
GFR, EST NON AFRICAN AMERICAN: 80 mL/min — AB (ref >90)
GLUCOSE: 135 mg/dL — AB (ref 70–99)
Potassium: 4.5 mEq/L (ref 3.7–5.3)
SODIUM: 133 meq/L — AB (ref 137–147)

## 2014-04-01 LAB — CULTURE, GROUP A STREP

## 2014-04-01 LAB — URINE CULTURE
Colony Count: NO GROWTH
Culture: NO GROWTH

## 2014-04-01 LAB — LEGIONELLA ANTIGEN, URINE: Legionella Antigen, Urine: NEGATIVE

## 2014-04-01 MED ORDER — FLUCONAZOLE 200 MG PO TABS
200.0000 mg | ORAL_TABLET | Freq: Every day | ORAL | Status: DC
  Administered 2014-04-01 – 2014-04-03 (×3): 200 mg via ORAL
  Filled 2014-04-01 (×3): qty 1

## 2014-04-01 MED ORDER — EMTRICITABINE-TENOFOVIR DF 200-300 MG PO TABS
1.0000 | ORAL_TABLET | Freq: Every day | ORAL | Status: DC
  Administered 2014-04-01 – 2014-04-03 (×3): 1 via ORAL
  Filled 2014-04-01 (×3): qty 1

## 2014-04-01 NOTE — Progress Notes (Signed)
[facility] PROGRESS NOTE  Rodney Walker DUK:[id number] DOB: 1981/02/23 DOA: 03/30/2014 PCP: No PCP Per Patient  Assessment/Plan: Fever  Active Problems:  Hyponatremia  AIDS  Thrush  Pharyngitis  CAD (coronary artery disease)  33 y.o. male with recently Diagnosed HIV/AIDS on HAART Rx for the past 3 weeks who presents to the ED with complaints if fevers, chills, sore throat a and nausea and vomiting x 3 days found to have pneumonia   1. Pneumonia/sirs/sepsis immunosuppressed with aids; suspicious PCP -CXR: Worsening bilateral fluffy airspace opacification raises concern for multifocal pneumonia -afebrile 4/5; defer management to ID, appreciate the input, pend cultures, check legionella   2. AKI with hyponatremia likely dehydration;  -? Bactrim effect; cont IVFs; monitor lytes; pend BMP 3. HIV/AIDS; management per ID  4. Oral Thrush; IV Fluconazole; denies dysphasia;  -if not improved may need EGD  5. CAD- Stable.  6. Emphysema likely due to tobacco use; may need outpatient PFTs, alpha-1 antitrypsin testing  -d/w patient, to f/u with pulmonologist; stop smoking    Code Status: full Family Communication:  D/w patient, his friend (indicate person spoken with, relationship, and if by phone, the number) Disposition Plan: home pend clinical improvement    Consultants:  ID  Procedures:  none  Antibiotics:  Fluconazole 4/3>>>>  Zosyn 4/4<<<<<  Azithromycin 4/3<<<<<   (indicate start date, and stop date if known)  HPI/Subjective: Alert  Objective: Filed Vitals:   04/01/14 0500  BP: 134/71  Pulse: 80  Temp: 97.3 F (36.3 C)  Resp: 18    Intake/Output Summary (Last 24 hours) at 04/01/14 1048 Last data filed at 04/01/14 0933  Gross per 24 hour  Intake    480 ml  Output      4 ml  Net    476 ml   Filed Weights   03/31/14 0035  Weight: 59.1 kg (130 lb 4.7 oz)    Exam:   General:  alert  Cardiovascular: s1,s2 rrr  Respiratory: few  rales   Abdomen: soft, nt,nd   Musculoskeletal: no LE edema    Data Reviewed: Basic Metabolic Panel:  Recent Labs Lab 03/30/14 2202 03/31/14 0540 03/31/14 1126  NA 127* 130* 127*  K 4.3 5.1 4.3  CL 90* 97 96  CO2 22 22 21   GLUCOSE 116* 102* 146*  BUN 23 26* 23  CREATININE 1.64* 1.74* 1.62*  CALCIUM 9.5 8.1* 7.9*   Liver Function Tests:  Recent Labs Lab 03/30/14 2202  AST 17  ALT 15  ALKPHOS 72  BILITOT 0.3  PROT 8.4*  ALBUMIN 2.8*    Recent Labs Lab 03/30/14 2202  LIPASE 25   No results found for this basename: AMMONIA,  in the last 168 hours CBC:  Recent Labs Lab 03/30/14 2202 03/31/14 0540  WBC 8.0 7.5  NEUTROABS 6.4  --   HGB 12.3* 10.5*  HCT 33.3* 28.5*  MCV 83.0 83.6  PLT 249 197   Cardiac Enzymes: No results found for this basename: CKTOTAL, CKMB, CKMBINDEX, TROPONINI,  in the last 168 hours BNP (last 3 results)  Recent Labs  03/18/14 1130  PROBNP 60.8   CBG: No results found for this basename: GLUCAP,  in the last 168 hours  Recent Results (from the past 240 hour(s))  RAPID STREP SCREEN     Status: None   Collection Time    03/30/14 10:14 PM      Result Value Ref Range Status   Streptococcus, Group A Screen (Direct) NEGATIVE  NEGATIVE Final  Comment: (NOTE)     A Rapid Antigen test may result negative if the antigen level in the     sample is below the detection level of this test. The FDA has not     cleared this test as a stand-alone test therefore the rapid antigen     negative result has reflexed to a Group A Strep culture.     Studies: Dg Abd Acute W/chest  03/30/2014   CLINICAL DATA:  Nausea and vomiting.  EXAM: ACUTE ABDOMEN SERIES (ABDOMEN 2 VIEW & CHEST 1 VIEW)  COMPARISON:  Chest radiograph performed 03/18/2014, and CT of the abdomen and pelvis performed 03/19/2014  FINDINGS: The lungs are well-aerated. Worsening bilateral fluffy airspace opacification raises concern for multifocal pneumonia, though an inflammatory  process might have a similar appearance. No pleural effusion or pneumothorax is seen. The cardiomediastinal silhouette is within normal limits. Prominent apical bulla are seen bilaterally.  The visualized bowel gas pattern is unremarkable. Scattered stool and air are seen within the colon; there is no evidence of small bowel dilatation to suggest obstruction. No free intra-abdominal air is identified on the provided upright view.  No acute osseous abnormalities are seen; the sacroiliac joints are unremarkable in appearance.  IMPRESSION: 1. Worsening bilateral fluffy airspace opacification raises concern for multifocal pneumonia, although an inflammatory process might have a similar appearance. 2. Prominent biapical bulla again noted. 3. Unremarkable bowel gas pattern; no free intra-abdominal air seen.   Electronically Signed   By: Roanna RaiderJeffery  Chang M.D.   On: 03/30/2014 23:37    Scheduled Meds: . abacavir  600 mg Oral Daily  . [START ON 04/02/2014] azithromycin  1,200 mg Oral Weekly  . enoxaparin (LOVENOX) injection  40 mg Subcutaneous Q24H  . fluconazole (DIFLUCAN) IV  200 mg Intravenous QHS  . lamiVUDine  150 mg Oral Daily  . predniSONE  40 mg Oral BID WC  . raltegravir  400 mg Oral BID  . sulfamethoxazole-trimethoprim  15 mg/kg/day Intravenous 3 times per day   Continuous Infusions: . sodium chloride 125 mL/hr at 04/01/14 0217    Principal Problem:   Fever Active Problems:   CAD (coronary artery disease)   AIDS   Thrush   Pharyngitis   Hyponatremia    Time spent: >35 minutes     Esperanza SheetsBURIEV, Rodney Stansel N  [facility] Pager [phone]. If 7PM-7AM, please contact night-coverage at [email or web address], password [email or web address] 04/01/2014, 10:48 AM  LOS: 2 days

## 2014-04-01 NOTE — Progress Notes (Addendum)
[facility]    Subjective: Still having drenching night sweats   Antibiotics:  Anti-infectives   Start     Dose/Rate Route Frequency Ordered Stop   04/02/14 1000  azithromycin (ZITHROMAX) tablet 1,200 mg     1,200 mg Oral Weekly 03/31/14 0042     04/01/14 1600  emtricitabine-tenofovir (TRUVADA) 200-300 MG per tablet 1 tablet     1 tablet Oral Daily 04/01/14 1453     04/01/14 1600  fluconazole (DIFLUCAN) tablet 200 mg     200 mg Oral Daily 04/01/14 1453     04/01/14 0000  sulfamethoxazole-trimethoprim (BACTRIM DS) 800-160 MG per tablet 1 tablet  Status:  Discontinued     1 tablet Oral Once per day on Mon Wed Fri 03/31/14 0733 03/31/14 0832   03/31/14 1800  fluconazole (DIFLUCAN) IVPB 200 mg  Status:  Discontinued     200 mg 100 mL/hr over 60 Minutes Intravenous Every 24 hours 03/30/14 2358 03/30/14 2359   03/31/14 1000  emtricitabine-tenofovir (TRUVADA) 200-300 MG per tablet 1 tablet  Status:  Discontinued     1 tablet Oral Daily 03/31/14 0042 03/31/14 0733   03/31/14 1000  raltegravir (ISENTRESS) tablet 400 mg     400 mg Oral 2 times daily 03/31/14 0042     03/31/14 1000  sulfamethoxazole-trimethoprim (BACTRIM DS) 800-160 MG per tablet 1 tablet  Status:  Discontinued     1 tablet Oral Daily 03/31/14 0055 03/31/14 0733   03/31/14 1000  emtricitabine-tenofovir (TRUVADA) 200-300 MG per tablet 1 tablet  Status:  Discontinued     1 tablet Oral Every other day 03/31/14 0733 03/31/14 0736   03/31/14 1000  abacavir (ZIAGEN) tablet 600 mg  Status:  Discontinued     600 mg Oral Daily 03/31/14 0736 04/01/14 1453   03/31/14 1000  lamiVUDine (EPIVIR) tablet 150 mg  Status:  Discontinued     150 mg Oral Daily 03/31/14 0738 04/01/14 1453   03/31/14 0900  sulfamethoxazole-trimethoprim (BACTRIM) 296 mg in dextrose 5 % 250 mL IVPB     15 mg/kg/day  59.1 kg 268.5 mL/hr over 60 Minutes Intravenous 3 times per day 03/31/14 0832     03/31/14 0045   sulfamethoxazole-trimethoprim (BACTRIM DS) 800-160 MG per tablet 1 tablet     1 tablet Oral  Once 03/31/14 0042 03/31/14 0056   03/30/14 2345  fluconazole (DIFLUCAN) IVPB 200 mg  Status:  Discontinued     200 mg 100 mL/hr over 60 Minutes Intravenous Daily at bedtime 03/30/14 2338 04/01/14 1453      Medications: Scheduled Meds: . [START ON 04/02/2014] azithromycin  1,200 mg Oral Weekly  . emtricitabine-tenofovir  1 tablet Oral Daily  . enoxaparin (LOVENOX) injection  40 mg Subcutaneous Q24H  . fluconazole  200 mg Oral Daily  . predniSONE  40 mg Oral BID WC  . raltegravir  400 mg Oral BID  . sulfamethoxazole-trimethoprim  15 mg/kg/day Intravenous 3 times per day   Continuous Infusions: . sodium chloride 125 mL/hr at 04/01/14 0217   PRN Meds:.acetaminophen, albuterol, alum & mag hydroxide-simeth, HYDROmorphone (DILAUDID) injection, ondansetron (ZOFRAN) IV, ondansetron, oxyCODONE    Objective: Weight change:   Intake/Output Summary (Last 24 hours) at 04/01/14 1604 Last data filed at 04/01/14 1329  Gross per 24 hour  Intake    480 ml  Output      2 ml  Net    478 ml   Blood pressure 134/71, pulse 80, temperature 97.3 F (36.3 C),  temperature source Oral, resp. rate 18, height 5\' 5"  (1.651 m), weight 130 lb 4.7 oz (59.1 kg), SpO2 96.00%. Temp:  [97.3 F (36.3 C)-97.7 F (36.5 C)] 97.3 F (36.3 C) (04/05 0500) Pulse Rate:  [77-84] 80 (04/05 0500) Resp:  [16-18] 18 (04/05 0500) BP: (111-137)/(62-80) 134/71 mmHg (04/05 0500) SpO2:  [96 %] 96 % (04/05 0500)  Physical Exam: General: Alert and awake, oriented x3, not in any acute distress. HEENT: anicteric sclera,EOMI, OP with improvement in thrush CVS regular rate, normal r,  no murmur rubs or gallops Chest: improved air movement bilaterally, no wheezing, rales or rhonchi Abdomen: soft nontender, nondistended, normal bowel sounds, Extremities: no  clubbing or edema noted bilaterally Skin: no rashes  Neuro:  nonfocal  CBC:   Recent Labs  03/30/14 2202 03/31/14 0540 03/31/14 1126 04/01/14 1009  WBC 8.0 7.5  --   --   HGB 12.3* 10.5*  --   --   HCT 33.3* 28.5*  --   --   NA 127* 130* 127* 133*  K 4.3 5.1 4.3 4.5  CL 90* 97 96 100  CO2 22 22 21 19   BUN 23 26* 23 23  CREATININE 1.64* 1.74* 1.62* 1.18     BMET  Recent Labs  03/31/14 1126 04/01/14 1009  NA 127* 133*  K 4.3 4.5  CL 96 100  CO2 21 19  GLUCOSE 146* 135*  BUN 23 23  CREATININE 1.62* 1.18  CALCIUM 7.9* 8.9     Liver Panel   Recent Labs  03/30/14 2202  PROT 8.4*  ALBUMIN 2.8*  AST 17  ALT 15  ALKPHOS 72  BILITOT 0.3       Sedimentation Rate No results found for this basename: ESRSEDRATE,  in the last 72 hours C-Reactive Protein No results found for this basename: CRP,  in the last 72 hours  Micro Results: Recent Results (from the past 240 hour(s))  URINE CULTURE     Status: None   Collection Time    03/30/14  9:50 PM      Result Value Ref Range Status   Specimen Description URINE, CLEAN CATCH   Final   Special Requests NONE   Final   Culture  Setup Time     Final   Value: 03/31/2014 00:56     Performed at [facility] Count     Final   Value: NO GROWTH     Performed at [facility]   Culture     Final   Value: NO GROWTH     Performed at [facility]   Report Status 04/01/2014 FINAL   Final  CULTURE, BLOOD (ROUTINE X 2)     Status: None   Collection Time    03/30/14 10:02 PM      Result Value Ref Range Status   Specimen Description BLOOD RIGHT ANTECUBITAL   Final   Special Requests BOTTLES DRAWN AEROBIC AND ANAEROBIC 5CC   Final   Culture  Setup Time     Final   Value: 03/31/2014 00:33     Performed at [facility]   Culture     Final   Value:        BLOOD CULTURE RECEIVED NO GROWTH TO DATE CULTURE WILL BE HELD FOR 5 DAYS BEFORE ISSUING A FINAL NEGATIVE REPORT     Performed at [facility]   Report Status PENDING   Incomplete   CULTURE, BLOOD (ROUTINE X 2)  Status: None   Collection Time    03/30/14 10:10 PM      Result Value Ref Range Status   Specimen Description BLOOD BLOOD LEFT FOREARM   Final   Special Requests BOTTLES DRAWN AEROBIC AND ANAEROBIC 4CC   Final   Culture  Setup Time     Final   Value: 03/31/2014 00:34     [facility]   Culture     Final   Value:        BLOOD CULTURE RECEIVED NO GROWTH TO DATE CULTURE WILL BE HELD FOR 5 DAYS BEFORE ISSUING A FINAL NEGATIVE REPORT     [facility]   Report Status PENDING   Incomplete  RAPID STREP SCREEN     Status: None   Collection Time    03/30/14 10:14 PM      Result Value Ref Range Status   Streptococcus, Group A Screen (Direct) NEGATIVE  NEGATIVE Final   Comment: (NOTE)     A Rapid Antigen test may result negative if the antigen level in the     sample is below the detection level of this test. The FDA has not     cleared this test as a stand-alone test therefore the rapid antigen     negative result has reflexed to a Group A Strep culture.    Studies/Results: Dg Abd Acute W/chest  03/30/2014   CLINICAL DATA:  Nausea and vomiting.  EXAM: ACUTE ABDOMEN SERIES (ABDOMEN 2 VIEW & CHEST 1 VIEW)  COMPARISON:  Chest radiograph performed 03/18/2014, and CT of the abdomen and pelvis performed 03/19/2014  FINDINGS: The lungs are well-aerated. Worsening bilateral fluffy airspace opacification raises concern for multifocal pneumonia, though an inflammatory process might have a similar appearance. No pleural effusion or pneumothorax is seen. The cardiomediastinal silhouette is within normal limits. Prominent apical bulla are seen bilaterally.  The visualized bowel gas pattern is unremarkable. Scattered stool and air are seen within the colon; there is no evidence of small bowel dilatation to suggest obstruction. No free intra-abdominal air is identified on the provided upright view.  No acute osseous abnormalities are seen;  the sacroiliac joints are unremarkable in appearance.  IMPRESSION: 1. Worsening bilateral fluffy airspace opacification raises concern for multifocal pneumonia, although an inflammatory process might have a similar appearance. 2. Prominent biapical bulla again noted. 3. Unremarkable bowel gas pattern; no free intra-abdominal air seen.   Electronically Signed   By: Roanna RaiderJeffery  Chang M.D.   On: 03/30/2014 23:37      Assessment/Plan:  Principal Problem:   Fever Active Problems:   CAD (coronary artery disease)   AIDS   Thrush   Pharyngitis   Hyponatremia    Rodney GrillsJonathan Everette Walker is a 33 y.o. male  with HIV/AIDS, oral candidiasis admitted with AKI, worsening infiltrates on CXR   #1 Possible PCP pneumonia:  --continue IV bactrim with prednisone  --we will need ambulatory sats documented in the chart to ensure he will not need home O2 at dc  #2 Thrush:  --continue fluconazole , but shcange to po  #3 AKI:  Better after IVF, will restart his truvada and check BMP in am  #5 HIV/AIDS: resume isentress and truvada  rx PCP pneumonia  If he has no probems with resumption of truvada would also consider exchange Tivicay for Isentress for QD dosing. 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   Dr. Drue Second is back tomorrow.   LOS: 2 days   Acey Lav 04/01/2014, 4:04 PM

## 2014-04-02 NOTE — Progress Notes (Signed)
[facility]  [id number] Rodney Walker is a 33 y.o. male  with HIV/AIDS, oral candidiasis admitted with AKI, worsening infiltrates on CXR   Subjective: Afebrile but had episode of reflux last night, and one episode of vomiting today   Antibiotics:  Anti-infectives   Start     Dose/Rate Route Frequency Ordered Stop   04/02/14 1000  azithromycin (ZITHROMAX) tablet 1,200 mg     1,200 mg Oral Weekly 03/31/14 0042     04/01/14 1600  emtricitabine-tenofovir (TRUVADA) 200-300 MG per tablet 1 tablet     1 tablet Oral Daily 04/01/14 1453     04/01/14 1600  fluconazole (DIFLUCAN) tablet 200 mg     200 mg Oral Daily 04/01/14 1453     04/01/14 0000  sulfamethoxazole-trimethoprim (BACTRIM DS) 800-160 MG per tablet 1 tablet  Status:  Discontinued     1 tablet Oral Once per day on Mon Wed Fri 03/31/14 0733 03/31/14 0832   03/31/14 1800  fluconazole (DIFLUCAN) IVPB 200 mg  Status:  Discontinued     200 mg 100 mL/hr over 60 Minutes Intravenous Every 24 hours 03/30/14 2358 03/30/14 2359   03/31/14 1000  emtricitabine-tenofovir (TRUVADA) 200-300 MG per tablet 1 tablet  Status:  Discontinued     1 tablet Oral Daily 03/31/14 0042 03/31/14 0733   03/31/14 1000  raltegravir (ISENTRESS) tablet 400 mg     400 mg Oral 2 times daily 03/31/14 0042     03/31/14 1000  sulfamethoxazole-trimethoprim (BACTRIM DS) 800-160 MG per tablet 1 tablet  Status:  Discontinued     1 tablet Oral Daily 03/31/14 0055 03/31/14 0733   03/31/14 1000  emtricitabine-tenofovir (TRUVADA) 200-300 MG per tablet 1 tablet  Status:  Discontinued     1 tablet Oral Every other day 03/31/14 0733 03/31/14 0736   03/31/14 1000  abacavir (ZIAGEN) tablet 600 mg  Status:  Discontinued     600 mg Oral Daily 03/31/14 0736 04/01/14 1453   03/31/14 1000  lamiVUDine (EPIVIR) tablet 150 mg  Status:  Discontinued     150 mg Oral Daily 03/31/14 0738 04/01/14 1453   03/31/14 0900  sulfamethoxazole-trimethoprim (BACTRIM) 296  mg in dextrose 5 % 250 mL IVPB     15 mg/kg/day  59.1 kg 268.5 mL/hr over 60 Minutes Intravenous 3 times per day 03/31/14 0832     03/31/14 0045  sulfamethoxazole-trimethoprim (BACTRIM DS) 800-160 MG per tablet 1 tablet     1 tablet Oral  Once 03/31/14 0042 03/31/14 0056   03/30/14 2345  fluconazole (DIFLUCAN) IVPB 200 mg  Status:  Discontinued     200 mg 100 mL/hr over 60 Minutes Intravenous Daily at bedtime 03/30/14 2338 04/01/14 1453      Medications: Scheduled Meds: . azithromycin  1,200 mg Oral Weekly  . emtricitabine-tenofovir  1 tablet Oral Daily  . enoxaparin (LOVENOX) injection  40 mg Subcutaneous Q24H  . fluconazole  200 mg Oral Daily  . predniSONE  40 mg Oral BID WC  . raltegravir  400 mg Oral BID  . sulfamethoxazole-trimethoprim  15 mg/kg/day Intravenous 3 times per day   Continuous Infusions: . sodium chloride 75 mL/hr (04/02/14 1031)   PRN Meds:.acetaminophen, albuterol, alum & mag hydroxide-simeth, HYDROmorphone (DILAUDID) injection, ondansetron (ZOFRAN) IV, ondansetron, oxyCODONE    Objective: Weight change:   Intake/Output Summary (Last 24 hours) at 04/02/14 1325 Last data filed at 04/01/14 1803  Gross per 24 hour  Intake    480 ml  Output  0 ml  Net    480 ml   Blood pressure 121/64, pulse 78, temperature 97.7 F (36.5 C), temperature source Oral, resp. rate 18, height 5\' 5"  (1.651 m), weight 130 lb 4.7 oz (59.1 kg), SpO2 98.00%. Temp:  [97.7 F (36.5 C)-98 F (36.7 C)] 97.7 F (36.5 C) (04/06 1001) Pulse Rate:  [73-89] 78 (04/06 1001) Resp:  [18-20] 18 (04/06 1001) BP: (111-123)/(62-66) 121/64 mmHg (04/06 1001) SpO2:  [94 %-98 %] 98 % (04/06 1001)  Physical Exam: General: Alert and awake, oriented x3, not in any acute distress. HEENT: anicteric sclera,EOMI, OP with improvement in thrush CVS regular rate, normal r,  no murmur rubs or gallops Chest: improved air movement bilaterally, no wheezing, rales or rhonchi Abdomen: soft nontender,  nondistended, normal bowel sounds, Extremities: no  clubbing or edema noted bilaterally Skin: no rashes  Neuro: nonfocal  CBC:   Recent Labs  03/30/14 2202 03/31/14 0540 03/31/14 1126 04/01/14 1009  WBC 8.0 7.5  --   --   HGB 12.3* 10.5*  --   --   HCT 33.3* 28.5*  --   --   NA 127* 130* 127* 133*  K 4.3 5.1 4.3 4.5  CL 90* 97 96 100  CO2 22 22 21 19   BUN 23 26* 23 23  CREATININE 1.64* 1.74* 1.62* 1.18     BMET  Recent Labs  03/31/14 1126 04/01/14 1009  NA 127* 133*  K 4.3 4.5  CL 96 100  CO2 21 19  GLUCOSE 146* 135*  BUN 23 23  CREATININE 1.62* 1.18  CALCIUM 7.9* 8.9     Liver Panel   Recent Labs  03/30/14 2202  PROT 8.4*  ALBUMIN 2.8*  AST 17  ALT 15  ALKPHOS 72  BILITOT 0.3    Micro Results: Recent Results (from the past 240 hour(s))  URINE CULTURE     Status: None   Collection Time    03/30/14  9:50 PM      Result Value Ref Range Status   Specimen Description URINE, CLEAN CATCH   Final   Special Requests NONE   Final   Culture  Setup Time     Final   Value: 03/31/2014 00:56     [facility]formed at [facility]   Colony Count     Final   Value: NO GROWTH     [facility]formed [facility]   Culture     Final   Value: NO GROWTH     [facility]formed at [facility]   Report Status 04/01/2014 FINAL   Final  CULTURE, BLOOD (ROUTINE X 2)     Status: None   Collection Time    03/30/14 10:02 PM      Result Value Ref Range Status   Specimen Description BLOOD RIGHT ANTECUBITAL   Final   Special Requests BOTTLES DRAWN AEROBIC AND ANAEROBIC 5CC   Final   Culture  Setup Time     Final   Value: 03/31/2014 00:33     [facility]formed [facility]   Culture     Final   Value:        BLOOD CULTURE RECEIVED NO GROWTH TO DATE CULTURE WILL BE HELD FOR 5 DAYS BEFORE ISSUING A FINAL NEGATIVE REPORT     [facility]formed at [facility]   Report Status PENDING   Incomplete  CULTURE, BLOOD (ROUTINE X 2)     Status: None   Collection Time  03/30/14 10:10 PM      Result Value Ref Range Status   Specimen Description BLOOD BLOOD LEFT FOREARM   Final   Special Requests BOTTLES DRAWN AEROBIC AND ANAEROBIC 4CC   Final   Culture  Setup Time     Final   Value: 03/31/2014 00:34     Performed at [facility]   Culture     Final   Value:        BLOOD CULTURE RECEIVED NO GROWTH TO DATE CULTURE WILL BE HELD FOR 5 DAYS BEFORE ISSUING A FINAL NEGATIVE REPORT     Performed at [facility]   Report Status PENDING   Incomplete  RAPID STREP SCREEN     Status: None   Collection Time    03/30/14 10:14 PM      Result Value Ref Range Status   Streptococcus, Group A Screen (Direct) NEGATIVE  NEGATIVE Final   Comment: (NOTE)     A Rapid Antigen test may result negative if the antigen level in the     sample is below the detection level of this test. The FDA has not     cleared this test as a stand-alone test therefore the rapid antigen     negative result has reflexed to a Group A Strep culture.  CULTURE, GROUP A STREP     Status: None   Collection Time    03/30/14 10:14 PM      Result Value Ref Range Status   Specimen Description THROAT   Final   Special Requests NONE   Final   Culture     Final   Value: No Beta Hemolytic Streptococci Isolated     Performed at [facility]   Report Status 04/01/2014 FINAL   Final    Studies/Results: No results found.    Assessment/Plan:  Principal Problem:   Fever Active Problems:   CAD (coronary artery disease)   AIDS   Thrush   Pharyngitis   Hyponatremia   1) Possible PCP pneumonia:  --continue IV bactrim with prednisone. Will transition to orals tomorrow --we will need ambulatory sats documented prior to discharge to determine if he needs  home supp O2   2) Thrush: continue with oral fluconazole  3)  AKI:  Resolving. Will check bmp in the am to ensure back to baseline  4) HIV/AIDS: continue on isentress and truvada  5) oi proph =  Continue with weekly  azithromycin  6) GERD = can give ppi but give 12 hrs apart from HIV meds  7) nausea = can give prn zofran/phenergan at discharge    LOS: 3 days   Yamila Cragin 04/02/2014, 1:25 PM

## 2014-04-02 NOTE — Progress Notes (Signed)
[facility] PROGRESS NOTE  Rodney FaceJonathan Everette Walker RUE:[id number] DOB: 03-28-1981 DOA: 03/30/2014 PCP: No PCP Per Patient  Assessment/Plan: Fever  Active Problems:  Hyponatremia  AIDS  Thrush  Pharyngitis  CAD (coronary artery disease)  33 y.o. male with recently Diagnosed HIV/AIDS on HAART Rx for the past 3 weeks who presents to the ED with complaints if fevers, chills, sore throat a and nausea and vomiting x 3 days found to have pneumonia   1. Pneumonia/sirs/sepsis immunosuppressed with aids; suspicious PCP -CXR: Worsening bilateral fluffy airspace opacification raises concern for multifocal pneumonia -afebrile since 4/5; defer management to ID, appreciate the input, cultures NGTD;    2. AKI with hyponatremia likely dehydration;  -resolved on IVFs; monitor lytes;  3. HIV/AIDS; management per ID  4. Oral Thrush; IV Fluconazole; denies dysphasia;  5. CAD- Stable.  6. Emphysema likely due to tobacco use vs PCP; may need outpatient PFTs, alpha-1 antitrypsin testing  -d/w patient, to f/u with pulmonologist; stop smoking   D/c plans per ID  Code Status: full Family Communication:  D/w patient, his friend (indicate person spoken with, relationship, and if by phone, the number) Disposition Plan: home pend clinical improvement    Consultants:  ID  Procedures:  none  Antibiotics:  Fluconazole 4/3>>>>  Zosyn 4/4<<<<<  Azithromycin 4/3<<<<<   (indicate start date, and stop date if known)  HPI/Subjective: Alert  Objective: Filed Vitals:   04/02/14 1001  BP: 121/64  Pulse: 78  Temp: 97.7 F (36.5 C)  Resp: 18    Intake/Output Summary (Last 24 hours) at 04/02/14 1017 Last data filed at 04/01/14 1803  Gross per 24 hour  Intake    480 ml  Output      0 ml  Net    480 ml   Filed Weights   03/31/14 0035  Weight: 59.1 kg (130 lb 4.7 oz)    Exam:   General:  alert  Cardiovascular: s1,s2 rrr  Respiratory: few rales   Abdomen: soft, nt,nd    Musculoskeletal: no LE edema    Data Reviewed: Basic Metabolic Panel:  Recent Labs Lab 03/30/14 2202 03/31/14 0540 03/31/14 1126 04/01/14 1009  NA 127* 130* 127* 133*  K 4.3 5.1 4.3 4.5  CL 90* 97 96 100  CO2 22 22 21 19   GLUCOSE 116* 102* 146* 135*  BUN 23 26* 23 23  CREATININE 1.64* 1.74* 1.62* 1.18  CALCIUM 9.5 8.1* 7.9* 8.9   Liver Function Tests:  Recent Labs Lab 03/30/14 2202  AST 17  ALT 15  ALKPHOS 72  BILITOT 0.3  PROT 8.4*  ALBUMIN 2.8*    Recent Labs Lab 03/30/14 2202  LIPASE 25   No results found for this basename: AMMONIA,  in the last 168 hours CBC:  Recent Labs Lab 03/30/14 2202 03/31/14 0540  WBC 8.0 7.5  NEUTROABS 6.4  --   HGB 12.3* 10.5*  HCT 33.3* 28.5*  MCV 83.0 83.6  PLT 249 197   Cardiac Enzymes: No results found for this basename: CKTOTAL, CKMB, CKMBINDEX, TROPONINI,  in the last 168 hours BNP (last 3 results)  Recent Labs  03/18/14 1130  PROBNP 60.8   CBG: No results found for this basename: GLUCAP,  in the last 168 hours  Recent Results (from the past 240 hour(s))  URINE CULTURE     Status: None   Collection Time    03/30/14  9:50 PM      Result Value Ref Range Status   Specimen Description URINE, CLEAN  CATCH   Final   Special Requests NONE   Final   Culture  Setup Time     Final   Value: 03/31/2014 00:56     Performed at [facility] Count     Final   Value: NO GROWTH     Performed at [facility]   Culture     Final   Value: NO GROWTH     Performed at [facility]   Report Status 04/01/2014 FINAL   Final  CULTURE, BLOOD (ROUTINE X 2)     Status: None   Collection Time    03/30/14 10:02 PM      Result Value Ref Range Status   Specimen Description BLOOD RIGHT ANTECUBITAL   Final   Special Requests BOTTLES DRAWN AEROBIC AND ANAEROBIC 5CC   Final   Culture  Setup Time     Final   Value: 03/31/2014 00:33     Performed at [facility]   Culture     Final    Value:        BLOOD CULTURE RECEIVED NO GROWTH TO DATE CULTURE WILL BE HELD FOR 5 DAYS BEFORE ISSUING A FINAL NEGATIVE REPORT     Performed at [facility]   Report Status PENDING   Incomplete  CULTURE, BLOOD (ROUTINE X 2)     Status: None   Collection Time    03/30/14 10:10 PM      Result Value Ref Range Status   Specimen Description BLOOD BLOOD LEFT FOREARM   Final   Special Requests BOTTLES DRAWN AEROBIC AND ANAEROBIC 4CC   Final   Culture  Setup Time     Final   Value: 03/31/2014 00:34     Performed at [facility]   Culture     Final   Value:        BLOOD CULTURE RECEIVED NO GROWTH TO DATE CULTURE WILL BE HELD FOR 5 DAYS BEFORE ISSUING A FINAL NEGATIVE REPORT     Performed at [facility]   Report Status PENDING   Incomplete  RAPID STREP SCREEN     Status: None   Collection Time    03/30/14 10:14 PM      Result Value Ref Range Status   Streptococcus, Group A Screen (Direct) NEGATIVE  NEGATIVE Final   Comment: (NOTE)     A Rapid Antigen test may result negative if the antigen level in the     sample is below the detection level of this test. The FDA has not     cleared this test as a stand-alone test therefore the rapid antigen     negative result has reflexed to a Group A Strep culture.  CULTURE, GROUP A STREP     Status: None   Collection Time    03/30/14 10:14 PM      Result Value Ref Range Status   Specimen Description THROAT   Final   Special Requests NONE   Final   Culture     Final   Value: No Beta Hemolytic Streptococci Isolated     Performed at [facility]   Report Status 04/01/2014 FINAL   Final     Studies: No results found.  Scheduled Meds: . azithromycin  1,200 mg Oral Weekly  . emtricitabine-tenofovir  1 tablet Oral Daily  . enoxaparin (LOVENOX) injection  40 mg Subcutaneous Q24H  . fluconazole  200 mg Oral Daily  . predniSONE  40 mg Oral BID WC  . raltegravir  400 mg Oral BID  . sulfamethoxazole-trimethoprim  15  mg/kg/day Intravenous 3 times per day   Continuous Infusions: . sodium chloride 125 mL/hr at 04/02/14 0330    Principal Problem:   Fever Active Problems:   CAD (coronary artery disease)   AIDS   Thrush   Pharyngitis   Hyponatremia    Time spent: >35 minutes     Rodney Walker  [facility] Pager [phone]. If 7PM-7AM, please contact night-coverage at [email or web address], password [facility] 04/02/2014, 10:17 AM  LOS: 3 days

## 2014-04-03 DIAGNOSIS — K219 Gastro-esophageal reflux disease without esophagitis: Secondary | ICD-10-CM

## 2014-04-03 DIAGNOSIS — B59 Pneumocystosis: Secondary | ICD-10-CM

## 2014-04-03 DIAGNOSIS — J189 Pneumonia, unspecified organism: Secondary | ICD-10-CM

## 2014-04-03 DIAGNOSIS — R110 Nausea: Secondary | ICD-10-CM

## 2014-04-03 LAB — BASIC METABOLIC PANEL
BUN: 18 mg/dL (ref 6–23)
CO2: 20 mEq/L (ref 19–32)
Calcium: 9.1 mg/dL (ref 8.4–10.5)
Chloride: 100 mEq/L (ref 96–112)
Creatinine, Ser: 1.13 mg/dL (ref 0.50–1.35)
GFR, EST NON AFRICAN AMERICAN: 84 mL/min — AB (ref >90)
GLUCOSE: 115 mg/dL — AB (ref 70–99)
POTASSIUM: 4.7 meq/L (ref 3.7–5.3)
Sodium: 131 mEq/L — ABNORMAL LOW (ref 137–147)

## 2014-04-03 LAB — HIV-1 GENOTYPR PLUS

## 2014-04-03 MED ORDER — EMTRICITABINE-TENOFOVIR DF 200-300 MG PO TABS: 1.0000 | ORAL_TABLET | Freq: Every day | ORAL | Status: DC

## 2014-04-03 MED ORDER — RALTEGRAVIR POTASSIUM 400 MG PO TABS: 400.0000 mg | ORAL_TABLET | Freq: Two times a day (BID) | ORAL | Status: DC

## 2014-04-03 MED ORDER — SULFAMETHOXAZOLE-TMP DS 800-160 MG PO TABS: 1.0000 | ORAL_TABLET | Freq: Two times a day (BID) | ORAL | Status: DC

## 2014-04-03 MED ORDER — PREDNISONE 20 MG PO TABS: 40.0000 mg | ORAL_TABLET | Freq: Every day | ORAL | Status: DC

## 2014-04-03 MED ORDER — FLUCONAZOLE 200 MG PO TABS
200.0000 mg | ORAL_TABLET | Freq: Every day | ORAL | Status: DC
Start: 2014-04-03 — End: 2014-08-31

## 2014-04-03 MED ORDER — ALBUTEROL SULFATE HFA 108 (90 BASE) MCG/ACT IN AERS: 2.0000 | INHALATION_SPRAY | Freq: Four times a day (QID) | RESPIRATORY_TRACT | Status: DC | PRN

## 2014-04-03 MED ORDER — SULFAMETHOXAZOLE-TMP DS 800-160 MG PO TABS: 1.0000 | ORAL_TABLET | Freq: Three times a day (TID) | ORAL | Status: DC

## 2014-04-03 MED ORDER — AZITHROMYCIN 600 MG PO TABS: 1200.0000 mg | ORAL_TABLET | ORAL | Status: DC

## 2014-04-03 NOTE — Progress Notes (Addendum)
[facility]  UR:[id number] Rodney Walker is a 33 y.o. male  with HIV/AIDS, oral candidiasis admitted with AKI, worsening infiltrates on CXR   Subjective: Feeling better wants to go home. No desats on ambulation   Antibiotics:  Anti-infectives   Start     Dose/Rate Route Frequency Ordered Stop   04/03/14 0000  azithromycin (ZITHROMAX) 600 MG tablet     1,200 mg Oral Weekly 04/03/14 0957     04/03/14 0000  emtricitabine-tenofovir (TRUVADA) 200-300 MG per tablet     1 tablet Oral Daily 04/03/14 0957     04/03/14 0000  fluconazole (DIFLUCAN) 200 MG tablet     200 mg Oral Daily 04/03/14 0957     04/03/14 0000  raltegravir (ISENTRESS) 400 MG tablet     400 mg Oral 2 times daily 04/03/14 0957     04/03/14 0000  sulfamethoxazole-trimethoprim (BACTRIM DS) 800-160 MG per tablet     1 tablet Oral 2 times daily 04/03/14 0957     04/02/14 1000  azithromycin (ZITHROMAX) tablet 1,200 mg     1,200 mg Oral Weekly 03/31/14 0042     04/01/14 1600  emtricitabine-tenofovir (TRUVADA) 200-300 MG per tablet 1 tablet     1 tablet Oral Daily 04/01/14 1453     04/01/14 1600  fluconazole (DIFLUCAN) tablet 200 mg     200 mg Oral Daily 04/01/14 1453     04/01/14 0000  sulfamethoxazole-trimethoprim (BACTRIM DS) 800-160 MG per tablet 1 tablet  Status:  Discontinued     1 tablet Oral Once per day on Mon Wed Fri 03/31/14 0733 03/31/14 0832   03/31/14 1800  fluconazole (DIFLUCAN) IVPB 200 mg  Status:  Discontinued     200 mg 100 mL/hr over 60 Minutes Intravenous Every 24 hours 03/30/14 2358 03/30/14 2359   03/31/14 1000  emtricitabine-tenofovir (TRUVADA) 200-300 MG per tablet 1 tablet  Status:  Discontinued     1 tablet Oral Daily 03/31/14 0042 03/31/14 0733   03/31/14 1000  raltegravir (ISENTRESS) tablet 400 mg     400 mg Oral 2 times daily 03/31/14 0042     03/31/14 1000  sulfamethoxazole-trimethoprim (BACTRIM DS) 800-160 MG per tablet 1 tablet  Status:  Discontinued     1 tablet  Oral Daily 03/31/14 0055 03/31/14 0733   03/31/14 1000  emtricitabine-tenofovir (TRUVADA) 200-300 MG per tablet 1 tablet  Status:  Discontinued     1 tablet Oral Every other day 03/31/14 0733 03/31/14 0736   03/31/14 1000  abacavir (ZIAGEN) tablet 600 mg  Status:  Discontinued     600 mg Oral Daily 03/31/14 0736 04/01/14 1453   03/31/14 1000  lamiVUDine (EPIVIR) tablet 150 mg  Status:  Discontinued     150 mg Oral Daily 03/31/14 0738 04/01/14 1453   03/31/14 0900  sulfamethoxazole-trimethoprim (BACTRIM) 296 mg in dextrose 5 % 250 mL IVPB     15 mg/kg/day  59.1 kg 268.5 mL/hr over 60 Minutes Intravenous 3 times per day 03/31/14 0832     03/31/14 0045  sulfamethoxazole-trimethoprim (BACTRIM DS) 800-160 MG per tablet 1 tablet     1 tablet Oral  Once 03/31/14 0042 03/31/14 0056   03/30/14 2345  fluconazole (DIFLUCAN) IVPB 200 mg  Status:  Discontinued     200 mg 100 mL/hr over 60 Minutes Intravenous Daily at bedtime 03/30/14 2338 04/01/14 1453      Medications: Scheduled Meds: . azithromycin  1,200 mg Oral Weekly  . emtricitabine-tenofovir  1 tablet  Oral Daily  . enoxaparin (LOVENOX) injection  40 mg Subcutaneous Q24H  . fluconazole  200 mg Oral Daily  . predniSONE  40 mg Oral BID WC  . raltegravir  400 mg Oral BID  . sulfamethoxazole-trimethoprim  15 mg/kg/day Intravenous 3 times per day   Continuous Infusions: . sodium chloride 75 mL/hr (04/02/14 1031)   PRN Meds:.acetaminophen, albuterol, alum & mag hydroxide-simeth, HYDROmorphone (DILAUDID) injection, ondansetron (ZOFRAN) IV, ondansetron, oxyCODONE    Objective: Weight change:  No intake or output data in the 24 hours ending 04/03/14 0958 Blood pressure 132/70, pulse 74, temperature 97.6 F (36.4 C), temperature source Oral, resp. rate 18, height 5\' 5"  (1.651 m), weight 130 lb 4.7 oz (59.1 kg), SpO2 97.00%. Temp:  [97.6 F (36.4 C)-98.5 F (36.9 C)] 97.6 F (36.4 C) (04/07 0933) Pulse Rate:  [65-84] 74 (04/07 0933) Resp:   [18] 18 (04/07 0933) BP: (114-138)/(64-80) 132/70 mmHg (04/07 0933) SpO2:  [95 %-98 %] 97 % (04/07 0933)  Physical Exam: Did not examine  CBC:   Recent Labs  04/01/14 1009 04/03/14 0537  NA 133* 131*  K 4.5 4.7  CL 100 100  CO2 19 20  BUN 23 18  CREATININE 1.18 1.13     BMET  Recent Labs  04/01/14 1009 04/03/14 0537  NA 133* 131*  K 4.5 4.7  CL 100 100  CO2 19 20  GLUCOSE 135* 115*  BUN 23 18  CREATININE 1.18 1.13  CALCIUM 8.9 9.1     Liver Panel  No results found for this basename: PROT, ALBUMIN, AST, ALT, ALKPHOS, BILITOT, BILIDIR, IBILI,  in the last 72 hours  Micro Results: Recent Results (from the past 240 hour(s))  URINE CULTURE     Status: None   Collection Time    03/30/14  9:50 PM      Result Value Ref Range Status   Specimen Description URINE, CLEAN CATCH   Final   Special Requests NONE   Final   Culture  Setup Time     Final   Value: 03/31/2014 00:56     [facility]formed [facility]   Colony Count     Final   Value: NO GROWTH     [facility]formed [facility]   Culture     Final   Value: NO GROWTH     Performed at [facility]   Report Status 04/01/2014 FINAL   Final  CULTURE, BLOOD (ROUTINE X 2)     Status: None   Collection Time    03/30/14 10:02 PM      Result Value Ref Range Status   Specimen Description BLOOD RIGHT ANTECUBITAL   Final   Special Requests BOTTLES DRAWN AEROBIC AND ANAEROBIC 5CC   Final   Culture  Setup Time     Final   Value: 03/31/2014 00:33     Performed at [facility]   Culture     Final   Value:        BLOOD CULTURE RECEIVED NO GROWTH TO DATE CULTURE WILL BE HELD FOR 5 DAYS BEFORE ISSUING A FINAL NEGATIVE REPORT     Performed at [facility]   Report Status PENDING   Incomplete  CULTURE, BLOOD (ROUTINE X 2)     Status: None   Collection Time    03/30/14 10:10 PM      Result Value Ref Range Status   Specimen Description BLOOD BLOOD LEFT FOREARM   Final   Special  Requests BOTTLES DRAWN AEROBIC AND ANAEROBIC 4CC   Final   Culture  Setup Time     Final   Value: 03/31/2014 00:34     Performed at [facility]   Culture     Final   Value:        BLOOD CULTURE RECEIVED NO GROWTH TO DATE CULTURE WILL BE HELD FOR 5 DAYS BEFORE ISSUING A FINAL NEGATIVE REPORT     Performed at [facility]   Report Status PENDING   Incomplete  RAPID STREP SCREEN     Status: None   Collection Time    03/30/14 10:14 PM      Result Value Ref Range Status   Streptococcus, Group A Screen (Direct) NEGATIVE  NEGATIVE Final   Comment: (NOTE)     A Rapid Antigen test may result negative if the antigen level in the     sample is below the detection level of this test. The FDA has not     cleared this test as a stand-alone test therefore the rapid antigen     negative result has reflexed to a Group A Strep culture.  CULTURE, GROUP A STREP     Status: None   Collection Time    03/30/14 10:14 PM      Result Value Ref Range Status   Specimen Description THROAT   Final   Special Requests NONE   Final   Culture     Final   Value: No Beta Hemolytic Streptococci Isolated     Performed at [facility]   Report Status 04/01/2014 FINAL   Final    Studies/Results: No results found.    Assessment/Plan:  Principal Problem:   Fever Active Problems:   CAD (coronary artery disease)   AIDS   Thrush   Pharyngitis   Hyponatremia   1) Possible PCP pneumonia:  --currently on day 5 of tx. Will need bactrim DS 800/160 2 tabs TID x 17 days, until April 23rd, then start 1 tab daily on April 24th. -- prednisone 40mg  daily x 5 days until April 11th, then start pred 20mg  daily on April 12th x 11 days 2) Thrush: continue fluconazole 200mg  daily x 14 days  3)  AKI:  Resolved  4) HIV/AIDS: continue on isentress and truvada  5) oi proph =  Continue with weekly azithromycin  6) GERD = can give ppi but give 12 hrs apart from HIV meds  7) nausea = can give prn  zofran/phenergan at discharge    LOS: 4 days   Channie Bostick 04/03/2014, 9:58 AM

## 2014-04-03 NOTE — Discharge Summary (Addendum)
Physician Discharge Summary  Rodney FaceJonathan Everette Walker ZOX:[id number] DOB: 10/26/1981 DOA: 03/30/2014  PCP: No PCP Per Patient  Admit date: 03/30/2014 Discharge date: 04/03/2014  Time spent: >35 minutes  Recommendations for Outpatient Follow-up:  F/u with ID clinic in 1 week  Discharge Diagnoses:  Principal Problem:   Fever Active Problems:   CAD (coronary artery disease)   AIDS   Thrush   Pharyngitis   Hyponatremia   Discharge Condition: stable   Diet recommendation: regular   Filed Weights   03/31/14 0035  Weight: 59.1 kg (130 lb 4.7 oz)    History of present illness:  Fever  Active Problems:  Hyponatremia  AIDS  Thrush  Pharyngitis  CAD (coronary artery disease)   33 y.o. male with recently Diagnosed HIV/AIDS on HAART Rx for the past 3 weeks who presents to the ED with complaints if fevers, chills, sore throat a and nausea and vomiting x 3 days found to have pneumonia   Hospital Course:  1. Pneumonia/sirs/sepsis immunosuppressed with aids; suspicious PCP  -CXR: Worsening bilateral fluffy airspace opacification raises concern for multifocal pneumonia  -afebrile, improved on atx/bactrim; cultures NGTD; patient was evaluated by ID who recommended to cont bactrim TID+azithromycin, prednisone + HIV meds  2. AKI with hyponatremia likely dehydration;  -resolved on IVFs; patient needs BMP in 3-5 days due to high dose of bactrim; d/w patient he agreed to f/u with ID clinic  3. HIV/AIDS; management per ID  4. Oral Thrush; IV Fluconazole; denies dysphasia;  5. CAD- Stable.  6. Emphysema likely due to tobacco use vs PCP; may need outpatient PFTs, alpha-1 antitrypsin testing  -d/w patient, to f/u with pulmonologist; stop smoking    Procedures:  nonen (i.e. Studies not automatically included, echos, thoracentesis, etc; not x-rays)  Consultations:  ID  Discharge Exam: Filed Vitals:   04/03/14 0933  BP: 132/70  Pulse: 74  Temp: 97.6 F (36.4 C)  Resp: 18     General: alert Cardiovascular: s1,s2 rrr Respiratory: CTA BL  Discharge Instructions  Discharge Orders   Future Appointments Provider Department Dept Phone   04/10/2014 3:30 PM Ginnie SmartJeffrey C Hatcher, MD [facility] [phone]   Future Orders Complete By Expires   Diet - low sodium heart healthy  As directed    Discharge instructions  As directed    Comments:     Please follow up with infectious disease clinic in 1 week   Increase activity slowly  As directed        Medication List         albuterol 108 (90 BASE) MCG/ACT inhaler  Commonly known as:  PROVENTIL HFA;VENTOLIN HFA  Inhale 2 puffs into the lungs every 6 (six) hours as needed for wheezing or shortness of breath.     azithromycin 600 MG tablet  Commonly known as:  ZITHROMAX  Take 2 tablets (1,200 mg total) by mouth once a week.     emtricitabine-tenofovir 200-300 MG per tablet  Commonly known as:  TRUVADA  Take 1 tablet by mouth daily.     fluconazole 200 MG tablet  Commonly known as:  DIFLUCAN  Take 1 tablet (200 mg total) by mouth daily.     predniSONE 20 MG tablet  Commonly known as:  DELTASONE  Take 2 tablets (40 mg total) by mouth daily with breakfast.     raltegravir 400 MG tablet  Commonly known as:  ISENTRESS  Take 1 tablet (400 mg total) by mouth 2 (two) times daily.  sulfamethoxazole-trimethoprim 800-160 MG per tablet  Commonly known as:  BACTRIM DS  Take 1 tablet by mouth 3 (three) times daily.       No Known Allergies     Follow-up Information   Follow up with No PCP Per Patient.   Specialty:  General Practice      Follow up with No PCP Per Patient.   Specialty:  General Practice      Follow up with Judyann Munson, MD. Schedule an appointment as soon as possible for a visit in 1 week. (need BMP done in 3-5 days )    Specialty:  Infectious Diseases   Contact information:   [address] [place] [address] [phone]         The results of significant diagnostics from this hospitalization (including imaging, microbiology, ancillary and laboratory) are listed below for reference.    Significant Diagnostic Studies: Dg Chest 2 View  03/18/2014   CLINICAL DATA:  Shortness of breath  EXAM: CHEST  2 VIEW  COMPARISON:  02/19/2010  FINDINGS: Mild patchy right upper lobe opacity, suspicious for pneumonia. No pleural effusion or pneumothorax.  The heart is normal in size.  Visualized osseous structures are within normal limits.  IMPRESSION: Mild patchy right upper lobe opacity, suspicious for pneumonia.   Electronically Signed   By: Charline Bills M.D.   On: 03/18/2014 11:58   Ct Angio Chest Pe W/cm &/or Wo Cm  03/18/2014   CLINICAL DATA:  Shortness of breath, cough, nausea/vomiting  EXAM: CT ANGIOGRAPHY CHEST WITH CONTRAST  TECHNIQUE: Multidetector CT imaging of the chest was performed using the standard protocol during bolus administration of intravenous contrast. Multiplanar CT image reconstructions and MIPs were obtained to evaluate the vascular anatomy.  CONTRAST:  50mL OMNIPAQUE IOHEXOL 350 MG/ML SOLN  COMPARISON:  Chest radiographs dated 03/18/2014. CTA chest dated 06/15/2009.  FINDINGS: No evidence of pulmonary embolism.  Moderate paraseptal emphysematous changes with bullous changes at the right lung apex. Mild dependent atelectasis in the bilateral lower lobes. Trace pleural fluid bilaterally. No pneumothorax.  No pulmonary opacities suspicious for pneumonia. Radiographic appearance corresponds to intervening normal pulmonary parenchyma adjacent to emphysematous bullous changes.  Visualized thyroid is unremarkable.  The heart is normal size.  No pericardial effusion.  Small prevascular node measuring 6 mm short axis. No suspicious hilar or axillary lymphadenopathy.  Visualized upper abdomen is unremarkable  Visualized osseous structures are within normal limits.  Review of the MIP images confirms the above findings.   IMPRESSION: No evidence of pulmonary embolism.  Moderate paraseptal emphysematous changes with bullous changes at the right lung apex.  Mild dependent atelectasis with trace pleural fluid bilaterally.  No pulmonary opacities suspicious for pneumonia.   Electronically Signed   By: Charline Bills M.D.   On: 03/18/2014 15:01   Ct Abdomen Pelvis W Contrast  03/19/2014   CLINICAL DATA:  Mid abdominal pain and shortness of breath beginning yesterday.  EXAM: CT ABDOMEN AND PELVIS WITH CONTRAST  TECHNIQUE: Multidetector CT imaging of the abdomen and pelvis was performed using the standard protocol following bolus administration of intravenous contrast.  CONTRAST:  OMNIPAQUE IOHEXOL 300 MG/ML  SOLN  COMPARISON:  None.  FINDINGS: The visualized lung bases are clear.  The liver, gallbladder, spleen, adrenal glands, left kidney, and pancreas have an unremarkable enhanced appearance. There is a punctate, 2 mm nonobstructing right upper pole renal calculus.  There is a small amount of free fluid in the pelvis. Contrast is present  in multiple loops of nondilated small and large bowel to the level of the distal transverse colon. There is no evidence of bowel obstruction. The appendix is identified in the right lower quadrant and is opacified with contrast material without evidence of dilatation. No gross bowel wall thickening is identified. No definite inflammatory changes are identified in the abdomen or pelvis, although evaluation is mildly limited by lack of abdominal fat.  Mild, scattered aortic and iliac calcification is present. The osseous structures are unremarkable.  IMPRESSION: 1. Small amount of free fluid in the pelvis, which is abnormal in a male patient but of uncertain etiology. 2. Nonobstructing right renal calculus. 3. No evidence of bowel obstruction or appendicitis.   Electronically Signed   By: Sebastian Ache   On: 03/19/2014 20:12   Dg Abd Acute W/chest  03/30/2014   CLINICAL DATA:  Nausea and  vomiting.  EXAM: ACUTE ABDOMEN SERIES (ABDOMEN 2 VIEW & CHEST 1 VIEW)  COMPARISON:  Chest radiograph performed 03/18/2014, and CT of the abdomen and pelvis performed 03/19/2014  FINDINGS: The lungs are well-aerated. Worsening bilateral fluffy airspace opacification raises concern for multifocal pneumonia, though an inflammatory process might have a similar appearance. No pleural effusion or pneumothorax is seen. The cardiomediastinal silhouette is within normal limits. Prominent apical bulla are seen bilaterally.  The visualized bowel gas pattern is unremarkable. Scattered stool and air are seen within the colon; there is no evidence of small bowel dilatation to suggest obstruction. No free intra-abdominal air is identified on the provided upright view.  No acute osseous abnormalities are seen; the sacroiliac joints are unremarkable in appearance.  IMPRESSION: 1. Worsening bilateral fluffy airspace opacification raises concern for multifocal pneumonia, although an inflammatory process might have a similar appearance. 2. Prominent biapical bulla again noted. 3. Unremarkable bowel gas pattern; no free intra-abdominal air seen.   Electronically Signed   By: Roanna Raider M.D.   On: 03/30/2014 23:37    Microbiology: Recent Results (from the past 240 hour(s))  URINE CULTURE     Status: None   Collection Time    03/30/14  9:50 PM      Result Value Ref Range Status   Specimen Description URINE, CLEAN CATCH   Final   Special Requests NONE   Final   Culture  Setup Time     Final   Value: 03/31/2014 00:56     Performed at [facility] Count     Final   Value: NO GROWTH     Performed at [facility]   Culture     Final   Value: NO GROWTH     Performed at [facility]   Report Status 04/01/2014 FINAL   Final  CULTURE, BLOOD (ROUTINE X 2)     Status: None   Collection Time    03/30/14 10:02 PM      Result Value Ref Range Status   Specimen Description BLOOD RIGHT  ANTECUBITAL   Final   Special Requests BOTTLES DRAWN AEROBIC AND ANAEROBIC 5CC   Final   Culture  Setup Time     Final   Value: 03/31/2014 00:33     Performed at [facility]   Culture     Final   Value:        BLOOD CULTURE RECEIVED NO GROWTH TO DATE CULTURE WILL BE HELD FOR 5 DAYS BEFORE ISSUING A FINAL NEGATIVE REPORT     Performed at [facility]  Report Status PENDING   Incomplete  CULTURE, BLOOD (ROUTINE X 2)     Status: None   Collection Time    03/30/14 10:10 PM      Result Value Ref Range Status   Specimen Description BLOOD BLOOD LEFT FOREARM   Final   Special Requests BOTTLES DRAWN AEROBIC AND ANAEROBIC 4CC   Final   Culture  Setup Time     Final   Value: 03/31/2014 00:34     Performed at [facility]   Culture     Final   Value:        BLOOD CULTURE RECEIVED NO GROWTH TO DATE CULTURE WILL BE HELD FOR 5 DAYS BEFORE ISSUING A FINAL NEGATIVE REPORT     Performed at [facility]   Report Status PENDING   Incomplete  RAPID STREP SCREEN     Status: None   Collection Time    03/30/14 10:14 PM      Result Value Ref Range Status   Streptococcus, Group A Screen (Direct) NEGATIVE  NEGATIVE Final   Comment: (NOTE)     A Rapid Antigen test may result negative if the antigen level in the     sample is below the detection level of this test. The FDA has not     cleared this test as a stand-alone test therefore the rapid antigen     negative result has reflexed to a Group A Strep culture.  CULTURE, GROUP A STREP     Status: None   Collection Time    03/30/14 10:14 PM      Result Value Ref Range Status   Specimen Description THROAT   Final   Special Requests NONE   Final   Culture     Final   Value: No Beta Hemolytic Streptococci Isolated     Performed at [facility]   Report Status 04/01/2014 FINAL   Final     Labs: Basic Metabolic Panel:  Recent Labs Lab 03/30/14 2202 03/31/14 0540 03/31/14 1126 04/01/14 1009 04/03/14 0537   NA 127* 130* 127* 133* 131*  K 4.3 5.1 4.3 4.5 4.7  CL 90* 97 96 100 100  CO2 22 22 21 19 20   GLUCOSE 116* 102* 146* 135* 115*  BUN 23 26* 23 23 18   CREATININE 1.64* 1.74* 1.62* 1.18 1.13  CALCIUM 9.5 8.1* 7.9* 8.9 9.1   Liver Function Tests:  Recent Labs Lab 03/30/14 2202  AST 17  ALT 15  ALKPHOS 72  BILITOT 0.3  PROT 8.4*  ALBUMIN 2.8*    Recent Labs Lab 03/30/14 2202  LIPASE 25   No results found for this basename: AMMONIA,  in the last 168 hours CBC:  Recent Labs Lab 03/30/14 2202 03/31/14 0540  WBC 8.0 7.5  NEUTROABS 6.4  --   HGB 12.3* 10.5*  HCT 33.3* 28.5*  MCV 83.0 83.6  PLT 249 197   Cardiac Enzymes: No results found for this basename: CKTOTAL, CKMB, CKMBINDEX, TROPONINI,  in the last 168 hours BNP: BNP (last 3 results)  Recent Labs  03/18/14 1130  PROBNP 60.8   CBG: No results found for this basename: GLUCAP,  in the last 168 hours     Signed:  Jonette Mate N  [facility] 04/03/2014, 10:01 AM

## 2014-04-03 NOTE — Progress Notes (Signed)
Patient discharge home with friend, alert and oriented, discharge instructions given, Patient verbalize understanding of discharge orders given, My Chart access declined at this time, will access at home, patient in stable condition at this time

## 2014-04-04 ENCOUNTER — Telehealth: Payer: Self-pay | Admitting: Internal Medicine

## 2014-04-04 ENCOUNTER — Telehealth: Payer: Self-pay | Admitting: *Deleted

## 2014-04-04 MED ORDER — SULFAMETHOXAZOLE-TMP DS 800-160 MG PO TABS: 2.0000 | ORAL_TABLET | Freq: Three times a day (TID) | ORAL | Status: DC

## 2014-04-04 NOTE — Telephone Encounter (Signed)
Left patient a voice mail to call the clinic with new instructions for the Bactrim. Please see Dr. Feliz Beam previous note. Rodney Walker

## 2014-04-04 NOTE — Addendum Note (Signed)
Addended by: Lurlean Leyden on: 04/04/2014 04:33 PM   Modules accepted: Orders

## 2014-04-04 NOTE — Telephone Encounter (Signed)
It appears that discharge instructions only gave bactrim DS 1 tab TID not 2 tab TID  HE Will need bactrim DS 800/160 2 tabs TID x 17 days, until April 23rd, then start 1 tab daily on April 24th.   -- prednisone 40mg  daily x 5 days until April 11th, then start pred 20mg  daily on April 12th x 11 days

## 2014-04-04 NOTE — Telephone Encounter (Signed)
Patient called and is very confused and upset. He advised his medications have been changed and he just picked some up and not sure how or what he is to be taking. Told him what Dr Drue Second told us and he was still upset. He wants to come into the clinic to have someone help him with the medications in his pill box. Advised him to come in and someone will work with him. Either the pharmacist or an [employer] fundraiser.

## 2014-04-05 NOTE — Telephone Encounter (Signed)
Pt, accompanied by his "best" friend, brought in all pill bottles and his pill boxes.  Requesting assistance with getting his pill boxes correct due to changes in his recent Bactrim DS rx change.  Pt also shared that when he picked up his prescriptions from [facility] the Azithromycin rx was not in the bag.  Phone call to [facility] to find out about new Bactrim rx and the issue about the Azithromycin.  [facility] is going to fill the new Bactrim DS rx and fill the Azithromycin rx.  The pt is going to take all of his pill bottles and his pill boxes to [facility].  [facility] pharmacist will sit down with the pt and his "best" friend to review the medications and fill the pill boxes for the next week.  RN advised the pt to continue returning to [facility] until he learns accurately how fill his pill boxes and learn his medications.  Pt was given a third pill box due to a "midday" dose of Bactrim DS until April 23rd.  RN advised the pt the he did the right thing coming to the Center for assistance with his medications.  These medications are a new part of his life and learning about the medications and how to accurately fill his pill boxes will assist him in the long run to manage his infection.  Pt verbalized understanding and is leaving the Center to go to [facility] to meet with the pharmacist.

## 2014-04-06 LAB — CULTURE, BLOOD (ROUTINE X 2)
CULTURE: NO GROWTH
CULTURE: NO GROWTH

## 2014-04-10 ENCOUNTER — Ambulatory Visit: Payer: Self-pay | Admitting: Infectious Diseases

## 2014-04-11 ENCOUNTER — Encounter: Payer: Self-pay | Admitting: Infectious Diseases

## 2014-04-11 ENCOUNTER — Ambulatory Visit (INDEPENDENT_AMBULATORY_CARE_PROVIDER_SITE_OTHER): Payer: Self-pay | Admitting: Infectious Diseases

## 2014-04-11 VITALS — BP 135/85 | HR 91 | Temp 98.2°F | Ht 64.0 in | Wt 127.0 lb

## 2014-04-11 DIAGNOSIS — B370 Candidal stomatitis: Secondary | ICD-10-CM

## 2014-04-11 DIAGNOSIS — B20 Human immunodeficiency virus [HIV] disease: Secondary | ICD-10-CM

## 2014-04-11 MED ORDER — NYSTATIN 100000 UNIT/ML MT SUSP: 5.0000 mL | Freq: Three times a day (TID) | OROMUCOSAL | Status: DC

## 2014-04-11 NOTE — Progress Notes (Signed)
   Subjective:    Patient ID: Rodney Walker, male    DOB: [birth year], 33 y.o.   MRN: [id number]  HPI 33 yo M newly diagnosed HIV/AIDS (March 2015). He was started on ISN/TRV March 2015 and then admitted to hospital earlier this month (4-3 to 4-7) with ARF and possible PCP. He was started on bactrim and prednisone, continue on his current ART. His ARF resolved with hydration.   He was being seen in dental clinic today and complained of thrush.  Has had worsening pain in his mouth. Can't swallow/eat. Not sores or ulcers, "just white stuff".  Was to be on prednisone til 4-23, fluconazole til 4-21, bactrim TID til 4-23. Has been on these. Has been taking his ART as well.   HIV 1 RNA Quant (copies/mL)  Date Value  03/22/2014 [id number]*     CD4 T Cell Abs (/uL)  Date Value  03/22/2014 20*    Review of Systems  HENT: Positive for trouble swallowing.   Respiratory: Negative for cough and shortness of breath.   Gastrointestinal: Negative for diarrhea.  Genitourinary: Negative for difficulty urinating.       Objective:   Physical Exam  Constitutional: He appears well-developed and well-nourished.  HENT:  Mouth/Throat:    Eyes: EOM are normal. Pupils are equal, round, and reactive to light.  Neck: Neck supple.  Cardiovascular: Normal rate, regular rhythm and normal heart sounds.   Pulmonary/Chest: Effort normal and breath sounds normal.  Abdominal: Soft. Bowel sounds are normal.  Lymphadenopathy:    He has cervical adenopathy.          Assessment & Plan:

## 2014-04-11 NOTE — Assessment & Plan Note (Signed)
Have advised him that this will be difficult to resolve while he is on prednisone. Will give him nystatin/lidocaine to see if we can improve this. Would continue his diflucan past his prednisone end date. F/u next 2-3 weeks.

## 2014-04-11 NOTE — Assessment & Plan Note (Signed)
He will continue his current ART and PCP rx. F/u 2-3 weeks.

## 2014-04-19 ENCOUNTER — Ambulatory Visit (INDEPENDENT_AMBULATORY_CARE_PROVIDER_SITE_OTHER): Payer: Self-pay | Admitting: Internal Medicine

## 2014-04-19 ENCOUNTER — Ambulatory Visit: Payer: Self-pay | Admitting: Internal Medicine

## 2014-04-19 ENCOUNTER — Encounter: Payer: Self-pay | Admitting: Internal Medicine

## 2014-04-19 VITALS — BP 141/80 | HR 76 | Temp 98.2°F | Wt 122.0 lb

## 2014-04-19 DIAGNOSIS — B59 Pneumocystosis: Secondary | ICD-10-CM

## 2014-04-19 DIAGNOSIS — B20 Human immunodeficiency virus [HIV] disease: Secondary | ICD-10-CM

## 2014-04-19 DIAGNOSIS — R509 Fever, unspecified: Secondary | ICD-10-CM

## 2014-04-19 DIAGNOSIS — B370 Candidal stomatitis: Secondary | ICD-10-CM

## 2014-04-19 DIAGNOSIS — J189 Pneumonia, unspecified organism: Secondary | ICD-10-CM

## 2014-04-19 LAB — COMPLETE METABOLIC PANEL WITH GFR
ALBUMIN: 3.6 g/dL (ref 3.5–5.2)
ALK PHOS: 66 U/L (ref 39–117)
ALT: 24 U/L (ref 0–53)
AST: 24 U/L (ref 0–37)
BUN: 28 mg/dL — AB (ref 6–23)
CHLORIDE: 91 meq/L — AB (ref 96–112)
CO2: 26 meq/L (ref 19–32)
Calcium: 9.5 mg/dL (ref 8.4–10.5)
Creat: 1.46 mg/dL — ABNORMAL HIGH (ref 0.50–1.35)
GFR, Est African American: 72 mL/min
GFR, Est Non African American: 62 mL/min
Glucose, Bld: 76 mg/dL (ref 70–99)
POTASSIUM: 4.2 meq/L (ref 3.5–5.3)
SODIUM: 126 meq/L — AB (ref 135–145)
TOTAL PROTEIN: 7.6 g/dL (ref 6.0–8.3)
Total Bilirubin: 0.2 mg/dL (ref 0.2–1.2)

## 2014-04-19 LAB — CBC WITH DIFFERENTIAL/PLATELET
Basophils Absolute: 0 10*3/uL (ref 0.0–0.1)
Basophils Relative: 0 % (ref 0–1)
Eosinophils Absolute: 0 10*3/uL (ref 0.0–0.7)
Eosinophils Relative: 0 % (ref 0–5)
HCT: 30.8 % — ABNORMAL LOW (ref 39.0–52.0)
HEMOGLOBIN: 10.9 g/dL — AB (ref 13.0–17.0)
LYMPHS ABS: 0.5 10*3/uL — AB (ref 0.7–4.0)
LYMPHS PCT: 12 % (ref 12–46)
MCH: 30.1 pg (ref 26.0–34.0)
MCHC: 35.4 g/dL (ref 30.0–36.0)
MCV: 85.1 fL (ref 78.0–100.0)
Monocytes Absolute: 0.4 10*3/uL (ref 0.1–1.0)
Monocytes Relative: 9 % (ref 3–12)
NEUTROS ABS: 3.6 10*3/uL (ref 1.7–7.7)
NEUTROS PCT: 79 % — AB (ref 43–77)
Platelets: 204 10*3/uL (ref 150–400)
RBC: 3.62 MIL/uL — AB (ref 4.22–5.81)
RDW: 15.7 % — ABNORMAL HIGH (ref 11.5–15.5)
WBC: 4.5 10*3/uL (ref 4.0–10.5)

## 2014-04-19 MED ORDER — SULFAMETHOXAZOLE-TMP DS 800-160 MG PO TABS: 1.0000 | ORAL_TABLET | Freq: Every day | ORAL | Status: DC

## 2014-04-19 NOTE — Progress Notes (Signed)
Subjective:    Patient ID: Rodney Walker, male    DOB: 14-Oct-1981, 33 y.o.   MRN: [id number]  HPI Rodney Walker is a 33yo M with HIV, CD 4 count of 20/VL 889,169 (march 2015) Geno +[id number] who was started on RLG/TRValittle over a month ago. In April, He was  Hospitalized for CAP/PCP to finish his bactrim TID dosing today and prednisone today. He was seen by Ninetta Lights last week who evaluated his ongoing thrush, which is now improved. He reports chapped lips. He had isolated fever of 103 yesterday but now resolved after taking pain med  Current Outpatient Prescriptions on File Prior to Visit  Medication Sig Dispense Refill  . albuterol (PROVENTIL HFA;VENTOLIN HFA) 108 (90 BASE) MCG/ACT inhaler Inhale 2 puffs into the lungs every 6 (six) hours as needed for wheezing or shortness of breath.  1 Inhaler  1  . azithromycin (ZITHROMAX) 600 MG tablet Take 2 tablets (1,200 mg total) by mouth once a week.  10 tablet  0  . emtricitabine-tenofovir (TRUVADA) 200-300 MG per tablet Take 1 tablet by mouth daily.  30 tablet  0  . fluconazole (DIFLUCAN) 200 MG tablet Take 1 tablet (200 mg total) by mouth daily.  21 tablet  0  . nystatin (MYCOSTATIN) 100000 UNIT/ML suspension Take 5 mLs (500,000 Units total) by mouth 3 (three) times daily.  60 mL  0  . predniSONE (DELTASONE) 20 MG tablet Take 2 tablets (40 mg total) by mouth daily with breakfast.  30 tablet  0  . raltegravir (ISENTRESS) 400 MG tablet Take 1 tablet (400 mg total) by mouth 2 (two) times daily.  60 tablet  0  . sulfamethoxazole-trimethoprim (BACTRIM DS) 800-160 MG per tablet Take 2 tablets by mouth 3 (three) times daily. For 17 days then 1 tab daily  51 tablet  0   No current facility-administered medications on file prior to visit.   Active Ambulatory Problems    Diagnosis Date Noted  . CAD (coronary artery disease) 11/13/2011  . AIDS 03/22/2014  . Thrush 03/22/2014  . CAP (community acquired pneumonia) 03/22/2014  . Fever 03/30/2014   . Pharyngitis 03/31/2014  . Hyponatremia 03/31/2014   Resolved Ambulatory Problems    Diagnosis Date Noted  . No Resolved Ambulatory Problems   Past Medical History  Diagnosis Date  . Chest pain 2010  . Hyperlipidemia   . Coronary artery disease   . MI (myocardial infarction)   . HIV infection    sochx : no working, has food access issues. Getting food from thp food bank    Review of Systems 10 point ros is negative except for chapped lips    Objective:   Physical Exam BP 141/80  Pulse 76  Temp(Src) 98.2 F (36.8 C) (Oral)  Wt 122 lb (55.339 kg) Physical Exam  Constitutional: He is oriented to person, place, and time. He appears well-developed and well-nourished. No distress. thin HENT: chapped lips, poor dentition Mouth/Throat: Oropharynx is clear and moist. No oropharyngeal exudate.  Cardiovascular: Normal rate, regular rhythm and normal heart sounds. Exam reveals no gallop and no friction rub.  No murmur heard.  Pulmonary/Chest: Effort normal and breath sounds normal. No respiratory distress. He has no wheezes.  Abdominal: Soft. Bowel sounds are normal. He exhibits no distension. There is no tenderness.  Lymphadenopathy:  He has no cervical adenopathy.  Neurological: He is alert and oriented to person, place, and time.  Skin: Skin is warm and dry. No rash noted. No erythema. Small pedunculated  lesion on left forehead? Wart. Psychiatric: He has a normal mood and affect. His behavior is normal.          Assessment & Plan:  hiv = continue with excellent adherence with RLG BID and truvada. Will check labs today  pcp pneumonia = has finished treatment course, now will start pcp proph with bactrim daily dosing. No longer needs prednisone  Thrush = appears improve,finish out fluconazole, and continue with nystatin swish and swallow  Fever = will check afb blood cx  Mac proph = continue with weekly azithromycin

## 2014-04-20 LAB — T-HELPER CELL (CD4) - (RCID CLINIC ONLY)
CD4 T CELL HELPER: 25 % — AB (ref 33–55)
CD4 T Cell Abs: 120 /uL — ABNORMAL LOW (ref 400–2700)

## 2014-04-21 LAB — HIV-1 RNA QUANT-NO REFLEX-BLD
HIV 1 RNA Quant: 118 copies/mL — ABNORMAL HIGH (ref <20)
HIV-1 RNA QUANT, LOG: 2.07 {Log} — AB (ref <1.30)

## 2014-04-22 ENCOUNTER — Encounter (HOSPITAL_COMMUNITY): Payer: Self-pay | Admitting: Emergency Medicine

## 2014-04-22 ENCOUNTER — Emergency Department (HOSPITAL_COMMUNITY)
Admission: EM | Admit: 2014-04-22 | Discharge: 2014-04-22 | Disposition: A | Discharge status: Home / Self Care | Payer: No Typology Code available for payment source | Attending: Emergency Medicine | Admitting: Emergency Medicine

## 2014-04-22 ENCOUNTER — Emergency Department (HOSPITAL_COMMUNITY): Payer: No Typology Code available for payment source

## 2014-04-22 ENCOUNTER — Telehealth: Payer: Self-pay | Admitting: Infectious Diseases

## 2014-04-22 DIAGNOSIS — I2510 Atherosclerotic heart disease of native coronary artery without angina pectoris: Secondary | ICD-10-CM | POA: Insufficient documentation

## 2014-04-22 DIAGNOSIS — Z8639 Personal history of other endocrine, nutritional and metabolic disease: Secondary | ICD-10-CM | POA: Insufficient documentation

## 2014-04-22 DIAGNOSIS — R509 Fever, unspecified: Secondary | ICD-10-CM | POA: Insufficient documentation

## 2014-04-22 DIAGNOSIS — Z862 Personal history of diseases of the blood and blood-forming organs and certain disorders involving the immune mechanism: Secondary | ICD-10-CM | POA: Insufficient documentation

## 2014-04-22 DIAGNOSIS — Z21 Asymptomatic human immunodeficiency virus [HIV] infection status: Secondary | ICD-10-CM | POA: Insufficient documentation

## 2014-04-22 DIAGNOSIS — IMO0001 Reserved for inherently not codable concepts without codable children: Secondary | ICD-10-CM | POA: Insufficient documentation

## 2014-04-22 DIAGNOSIS — I252 Old myocardial infarction: Secondary | ICD-10-CM | POA: Insufficient documentation

## 2014-04-22 DIAGNOSIS — R5383 Other fatigue: Secondary | ICD-10-CM

## 2014-04-22 DIAGNOSIS — Z792 Long term (current) use of antibiotics: Secondary | ICD-10-CM | POA: Insufficient documentation

## 2014-04-22 DIAGNOSIS — Z87891 Personal history of nicotine dependence: Secondary | ICD-10-CM | POA: Insufficient documentation

## 2014-04-22 DIAGNOSIS — Z9889 Other specified postprocedural states: Secondary | ICD-10-CM | POA: Insufficient documentation

## 2014-04-22 DIAGNOSIS — Z79899 Other long term (current) drug therapy: Secondary | ICD-10-CM | POA: Insufficient documentation

## 2014-04-22 DIAGNOSIS — R5381 Other malaise: Secondary | ICD-10-CM | POA: Insufficient documentation

## 2014-04-22 LAB — CBC WITH DIFFERENTIAL/PLATELET
BASOS ABS: 0 10*3/uL (ref 0.0–0.1)
BASOS PCT: 0 % (ref 0–1)
EOS PCT: 2 % (ref 0–5)
Eosinophils Absolute: 0.1 10*3/uL (ref 0.0–0.7)
HCT: 27.1 % — ABNORMAL LOW (ref 39.0–52.0)
Hemoglobin: 9.9 g/dL — ABNORMAL LOW (ref 13.0–17.0)
LYMPHS PCT: 13 % (ref 12–46)
Lymphs Abs: 0.5 10*3/uL — ABNORMAL LOW (ref 0.7–4.0)
MCH: 30.2 pg (ref 26.0–34.0)
MCHC: 36.5 g/dL — AB (ref 30.0–36.0)
MCV: 82.6 fL (ref 78.0–100.0)
Monocytes Absolute: 0.4 10*3/uL (ref 0.1–1.0)
Monocytes Relative: 12 % (ref 3–12)
NEUTROS ABS: 2.6 10*3/uL (ref 1.7–7.7)
Neutrophils Relative %: 73 % (ref 43–77)
PLATELETS: 157 10*3/uL (ref 150–400)
RBC: 3.28 MIL/uL — ABNORMAL LOW (ref 4.22–5.81)
RDW: 15.3 % (ref 11.5–15.5)
WBC: 3.6 10*3/uL — ABNORMAL LOW (ref 4.0–10.5)

## 2014-04-22 LAB — URINALYSIS, ROUTINE W REFLEX MICROSCOPIC
Bilirubin Urine: NEGATIVE
Glucose, UA: 100 mg/dL — AB
Ketones, ur: NEGATIVE mg/dL
Leukocytes, UA: NEGATIVE
Nitrite: NEGATIVE
Protein, ur: 100 mg/dL — AB
SPECIFIC GRAVITY, URINE: 1.013 (ref 1.005–1.030)
UROBILINOGEN UA: 0.2 mg/dL (ref 0.0–1.0)
pH: 5.5 (ref 5.0–8.0)

## 2014-04-22 LAB — BASIC METABOLIC PANEL
BUN: 22 mg/dL (ref 6–23)
CO2: 24 meq/L (ref 19–32)
Calcium: 9.6 mg/dL (ref 8.4–10.5)
Chloride: 91 mEq/L — ABNORMAL LOW (ref 96–112)
Creatinine, Ser: 1.22 mg/dL (ref 0.50–1.35)
GFR calc Af Amer: 89 mL/min — ABNORMAL LOW (ref >90)
GFR calc non Af Amer: 77 mL/min — ABNORMAL LOW (ref >90)
Glucose, Bld: 105 mg/dL — ABNORMAL HIGH (ref 70–99)
Potassium: 4.1 mEq/L (ref 3.7–5.3)
SODIUM: 127 meq/L — AB (ref 137–147)

## 2014-04-22 LAB — URINE MICROSCOPIC-ADD ON

## 2014-04-22 MED ORDER — HYDROMORPHONE HCL PF 1 MG/ML IJ SOLN
1.0000 mg | Freq: Once | INTRAMUSCULAR | Status: AC
  Administered 2014-04-22: 1 mg via INTRAVENOUS
  Filled 2014-04-22: qty 1

## 2014-04-22 MED ORDER — SODIUM CHLORIDE 0.9 % IV SOLN
1000.0000 mL | Freq: Once | INTRAVENOUS | Status: AC
  Administered 2014-04-22: 1000 mL via INTRAVENOUS

## 2014-04-22 MED ORDER — ACETAMINOPHEN 325 MG PO TABS
650.0000 mg | ORAL_TABLET | Freq: Once | ORAL | Status: AC
  Administered 2014-04-22: 650 mg via ORAL
  Filled 2014-04-22: qty 2

## 2014-04-22 MED ORDER — SODIUM CHLORIDE 0.9 % IV SOLN
1000.0000 mL | INTRAVENOUS | Status: DC
  Administered 2014-04-22: 1000 mL via INTRAVENOUS

## 2014-04-22 NOTE — ED Notes (Signed)
Pt transported to XRAY °

## 2014-04-22 NOTE — ED Provider Notes (Signed)
CSN: [id number]     Arrival date & time 04/22/14  1125 History   First MD Initiated Contact with Patient 04/22/14 1157     Chief Complaint  Patient presents with  . Fever  . Weakness     HPI Patient reports fever and generalized myalgias with the majority of his myalgias in his bilateral thighs and hips.  He has full range of motion of his legs.  He reports no weakness of his legs.  He states that movement of his legs exacerbates the pain in his thighs.  Patient has a history of HIV with recent diagnosis.  He is currently taking his medications and his CD4 count appears to be improving and his viral load is decreasing per recent laboratory studies.  No cough or congestion.  No urinary complaints.  Symptoms are mild in severity.  Nothing worsens or improves his pain.   Past Medical History  Diagnosis Date  . Chest pain 2010  . Hyperlipidemia   . Coronary artery disease     SPONTANEOUS DISSECTION AND PLAQUE RUPTURE OF HIS LEFT ANTERIOR DESCENDING ARTERY  . MI (myocardial infarction)   . HIV infection    Past Surgical History  Procedure Laterality Date  . Cardiac catheterization  06/24/2009    EF 60%  . Cardiac catheterization  06/17/2009    EF 45%. ANTERIOR HYPOKINESIS  . US echocardiography  12/09/2009    EF 55-60%   Family History  Problem Relation Age of Onset  . Diabetes Mother   . Liver cancer Father    History  Substance Use Topics  . Smoking status: Former Smoker    Types: Cigarettes    Quit date: 03/22/2014  . Smokeless tobacco: Never Used  . Alcohol Use: No    Review of Systems  All other systems reviewed and are negative.     Allergies  Review of patient's allergies indicates no known allergies.  Home Medications   Prior to Admission medications   Medication Sig Start Date End Date Taking? Authorizing Provider  albuterol (PROVENTIL HFA;VENTOLIN HFA) 108 (90 BASE) MCG/ACT inhaler Inhale 2 puffs into the lungs every 6 (six) hours as needed for wheezing or  shortness of breath. 04/03/14  Yes Esperanza Sheets, MD  azithromycin (ZITHROMAX) 600 MG tablet Take 1,200 mg by mouth once a week. Sundays. 04/03/14  Yes Esperanza Sheets, MD  emtricitabine-tenofovir (TRUVADA) 200-300 MG per tablet Take 1 tablet by mouth daily. 04/03/14  Yes Esperanza Sheets, MD  fluconazole (DIFLUCAN) 200 MG tablet Take 1 tablet (200 mg total) by mouth daily. 04/03/14  Yes Esperanza Sheets, MD  ibuprofen (ADVIL,MOTRIN) 800 MG tablet Take 400 mg by mouth every 8 (eight) hours as needed (for pain.).   Yes Historical Provider, MD  nystatin (MYCOSTATIN) 100000 UNIT/ML suspension Take 5 mLs (500,000 Units total) by mouth 3 (three) times daily. 04/11/14  Yes Ginnie Smart, MD  raltegravir (ISENTRESS) 400 MG tablet Take 1 tablet (400 mg total) by mouth 2 (two) times daily. 04/03/14  Yes Esperanza Sheets, MD  sulfamethoxazole-trimethoprim (BACTRIM DS) 800-160 MG per tablet Take 1 tablet by mouth daily. 04/19/14  Yes Judyann Munson, MD   BP 131/75  Pulse 112  Temp(Src) 100.3 F (37.9 C) (Oral)  Resp 16  SpO2 99% Physical Exam  Nursing note and vitals reviewed. Constitutional: He is oriented to person, place, and time. He appears well-developed and well-nourished.  HENT:  Head: Normocephalic and atraumatic.  Eyes: EOM are normal. Pupils are equal,  round, and reactive to light.  Neck: Normal range of motion.  Cardiovascular: Normal rate, regular rhythm, normal heart sounds and intact distal pulses.   Pulmonary/Chest: Effort normal and breath sounds normal. No respiratory distress.  Abdominal: Soft. He exhibits no distension. There is no tenderness.  Musculoskeletal: Normal range of motion.  Full range of motion bilateral ankles knees and hips  Neurological: He is alert and oriented to person, place, and time.  5/5 strength in major muscle groups of  bilateral upper and lower extremities. Speech normal. No facial asymetry.   Skin: Skin is warm and dry.  Psychiatric: He has a normal mood  and affect. Judgment normal.    ED Course  Procedures (including critical care time) Labs Review Labs Reviewed  CBC WITH DIFFERENTIAL - Abnormal; Notable for the following:    WBC 3.6 (*)    RBC 3.28 (*)    Hemoglobin 9.9 (*)    HCT 27.1 (*)    MCHC 36.5 (*)    Lymphs Abs 0.5 (*)    All other components within normal limits  BASIC METABOLIC PANEL    Imaging Review Dg Chest 2 View  04/22/2014   CLINICAL DATA:  Chest pain.  Weakness.  Fever.  EXAM: CHEST  2 VIEW  COMPARISON:  DG ABD ACUTE W/CHEST dated 03/30/2014; CT ANGIO CHEST W/CM &/OR WO/CM dated 06/15/2009; CT ANGIO CHEST W/CM &/OR WO/CM dated 03/18/2014; DG CHEST 2 VIEW dated 03/18/2014  FINDINGS: Since the prior exam, the airspace opacities in the upper lobes have nearly completely resolved. Faint opacity remains present, probably represent post infectious/inflammatory changes. There is no pleural effusion. The cardiopericardial silhouette and mediastinal contours are within normal limits. Bullous emphysema noted at the right apex and to a lesser extent at the left apex.  IMPRESSION: Mild post infectious/ inflammatory changes in the upper lobes superimposed on bullous emphysema.   Electronically Signed   By: Andreas NewportGeoffrey  Lamke M.D.   On: 04/22/2014 13:56     EKG Interpretation None      MDM   Final diagnoses:  Fever    Patient feels much better after IV fluids.  Blood and urine cultures obtained.  Chest x-ray without signs of infection.  Referral back to his infectious disease team.  Full range of motion lower committees.  Ambulatory in the emergency department.  No rash noted.  Patient will continue his home medications.  He understands to return to the ER for new or worsening symptoms    Lyanne CoKevin M Lilinoe Acklin, MD 04/22/14 1557

## 2014-04-22 NOTE — ED Notes (Signed)
Pt presents with complaint of weakness and fever that began two days ago. Pt states that his fever was 103.0, which he took a "fever medicine." Pt reports weakness and pain in his bilateral lower extremities and states that if it weren't for taking 800 mg of ibuprofen that he wouldn't be able to walk. Pt is A/O x4 and in NAD.

## 2014-04-22 NOTE — ED Notes (Signed)
Patient taking HIV meds, MD [facility] aware and encourages.

## 2014-04-22 NOTE — ED Notes (Signed)
Pt aware of the need for a urine sample. 

## 2014-04-22 NOTE — Telephone Encounter (Signed)
Pt called in c/o pain (hips into his legs). Was seen in ED earlier today with temp 100.3. He received dilaudid in ED.  He had no pain rx to go home with. I explained to him that I cannot send him a pain rx over the phone.  I suggested he go back to the ED or come to ID clinic in the AM.  I agrees to come in to the clinic in the AM.

## 2014-04-22 NOTE — ED Notes (Signed)
Pt. Ambulated with no assist. Pt. Complaining of left and right hip pain when he tries to ambulate. Dr. Patria Mane was notified along with the nurse.

## 2014-04-22 NOTE — ED Notes (Addendum)
Sandwich and water provided to the patient per request. Tech to ambulate pt when he finishes eating.

## 2014-04-23 ENCOUNTER — Encounter: Payer: Self-pay | Admitting: Infectious Diseases

## 2014-04-23 ENCOUNTER — Ambulatory Visit (INDEPENDENT_AMBULATORY_CARE_PROVIDER_SITE_OTHER): Payer: Self-pay | Admitting: Infectious Diseases

## 2014-04-23 VITALS — BP 131/75 | HR 116 | Temp 99.4°F | Ht 64.0 in | Wt 127.0 lb

## 2014-04-23 DIAGNOSIS — M791 Myalgia, unspecified site: Secondary | ICD-10-CM | POA: Insufficient documentation

## 2014-04-23 DIAGNOSIS — B20 Human immunodeficiency virus [HIV] disease: Secondary | ICD-10-CM

## 2014-04-23 DIAGNOSIS — IMO0001 Reserved for inherently not codable concepts without codable children: Secondary | ICD-10-CM

## 2014-04-23 LAB — URINE CULTURE
Colony Count: NO GROWTH
Culture: NO GROWTH

## 2014-04-23 MED ORDER — HYDROCODONE-IBUPROFEN 7.5-200 MG PO TABS: 1.0000 | ORAL_TABLET | Freq: Three times a day (TID) | ORAL | Status: DC | PRN

## 2014-04-23 NOTE — Assessment & Plan Note (Signed)
His CD4 has improved. He has been taking azithro qday. Will stop this as well as his fluconazole.  Will see him back in 6-8 weeks.

## 2014-04-23 NOTE — Telephone Encounter (Signed)
Patient will come today at 4:00. Andree Coss, RN

## 2014-04-23 NOTE — Assessment & Plan Note (Signed)
The etiology of this is unclear. Will give him short course of vicoprofen. Can f/u in 6-8 weeks. He is on no meds which suggest cause (no statin).

## 2014-04-23 NOTE — Progress Notes (Signed)
   Subjective:    Patient ID: Brenton Grills, male    DOB: 1981/09/05, 33 y.o.   MRN: [id number]  Leg Pain    33 yo M newly diagnosed HIV/AIDS (March 2015). He was started on ISN/TRV March 2015 and then admitted to hospital earlier this month (4-3 to 4-7) with ARF and possible PCP. He was started on bactrim and prednisone, continue on his current ART. His ARF resolved with hydration.  He called this weekend after being seen in ED for pain. He was given dilaudid in ED and then no po medication for f/u.  Has been taking ibuprofen at home which he says is the only thing that lets him come here.  Felt like pain was so severe he was paralyzed. Felt like his muscles were grinding together.  States this started 3 days ago, with fever. None since.  This AM felt same.  No hx of falls, prolonged down time. Denies sick exposures.  ISN/TRV, bactrim, fluconazole, azithromycin (was taking daily).   HIV 1 RNA Quant (copies/mL)  Date Value  04/19/2014 118*  03/22/2014 [id number]*     CD4 T Cell Abs (/uL)  Date Value  04/19/2014 120*  03/22/2014 20*     Review of Systems     Objective:   Physical Exam  Constitutional: He appears well-developed and well-nourished.  HENT:  Head: Normocephalic and atraumatic.  Mouth/Throat: No oropharyngeal exudate.  Eyes: EOM are normal. Pupils are equal, round, and reactive to light.  Neck: Neck supple.  Cardiovascular: Normal rate, regular rhythm and normal heart sounds.   Pulmonary/Chest: Effort normal and breath sounds normal.  Abdominal: Soft. Bowel sounds are normal. There is no tenderness. There is no rebound.  Musculoskeletal: Normal range of motion. He exhibits no edema and no tenderness.  Lymphadenopathy:    He has no cervical adenopathy.  Neurological: He exhibits normal muscle tone.          Assessment & Plan:

## 2014-04-28 LAB — CULTURE, BLOOD (ROUTINE X 2)
CULTURE: NO GROWTH
Culture: NO GROWTH

## 2014-05-02 ENCOUNTER — Encounter: Payer: Self-pay | Admitting: Licensed Clinical Social Worker

## 2014-05-02 NOTE — Progress Notes (Signed)
CM-Rodney Walker assigned to CT until 03/2015

## 2014-05-04 ENCOUNTER — Encounter: Payer: Self-pay | Admitting: Infectious Diseases

## 2014-06-03 LAB — AFB CULTURE, BLOOD

## 2014-06-07 ENCOUNTER — Telehealth: Payer: Self-pay | Admitting: *Deleted

## 2014-06-07 NOTE — Telephone Encounter (Signed)
Requesting appt to evaluate rash.  Given appt for 06/11/14 w/ Dr. Luciana Axe for evaluation.

## 2014-06-11 ENCOUNTER — Encounter: Payer: Self-pay | Admitting: Internal Medicine

## 2014-06-11 ENCOUNTER — Ambulatory Visit (INDEPENDENT_AMBULATORY_CARE_PROVIDER_SITE_OTHER): Payer: Self-pay | Admitting: Internal Medicine

## 2014-06-11 VITALS — BP 108/75 | HR 96 | Temp 98.5°F | Ht 64.0 in | Wt 137.0 lb

## 2014-06-11 DIAGNOSIS — L731 Pseudofolliculitis barbae: Secondary | ICD-10-CM

## 2014-06-11 DIAGNOSIS — D7218 Eosinophilia in diseases classified elsewhere: Secondary | ICD-10-CM

## 2014-06-11 DIAGNOSIS — L738 Other specified follicular disorders: Secondary | ICD-10-CM | POA: Insufficient documentation

## 2014-06-11 DIAGNOSIS — L678 Other hair color and hair shaft abnormalities: Secondary | ICD-10-CM

## 2014-06-11 DIAGNOSIS — R21 Rash and other nonspecific skin eruption: Secondary | ICD-10-CM | POA: Insufficient documentation

## 2014-06-11 NOTE — Assessment & Plan Note (Signed)
Supportive therapy for itching.  More consistent with eos fol since it is quite itchy.

## 2014-06-11 NOTE — Progress Notes (Signed)
   Subjective:    Patient ID: Rodney Walker, male    DOB: 12/20/1981, 33 y.o.   MRN: [id number]  Rash Pertinent negatives include no diarrhea or fatigue.   Here for a work in visit.  Started several months ago on ISN/TRV and taking well.  CD4 up to 120, viral load down to 118.  No missed doses. Comes in today for a rash.  Has had itching, pappilary rash on torso, arms, legs and scratching a lot.  Started several weeks ago.     Review of Systems  Constitutional: Negative for fatigue.  Gastrointestinal: Negative for nausea and diarrhea.  Skin: Positive for rash.       Objective:   Physical Exam  Constitutional: He appears well-developed and well-nourished. No distress.  Skin:  Diffuse papillary pruritic rash on arms, torso          Assessment & Plan:

## 2014-06-27 ENCOUNTER — Ambulatory Visit: Payer: Self-pay | Admitting: Internal Medicine

## 2014-07-09 ENCOUNTER — Other Ambulatory Visit (INDEPENDENT_AMBULATORY_CARE_PROVIDER_SITE_OTHER): Payer: Self-pay

## 2014-07-09 DIAGNOSIS — B20 Human immunodeficiency virus [HIV] disease: Secondary | ICD-10-CM

## 2014-07-09 LAB — COMPREHENSIVE METABOLIC PANEL
ALT: 17 U/L (ref 0–53)
AST: 20 U/L (ref 0–37)
Albumin: 3.9 g/dL (ref 3.5–5.2)
Alkaline Phosphatase: 86 U/L (ref 39–117)
BILIRUBIN TOTAL: 0.3 mg/dL (ref 0.2–1.2)
BUN: 15 mg/dL (ref 6–23)
CALCIUM: 9.7 mg/dL (ref 8.4–10.5)
CHLORIDE: 102 meq/L (ref 96–112)
CO2: 27 meq/L (ref 19–32)
Creat: 1.4 mg/dL — ABNORMAL HIGH (ref 0.50–1.35)
GLUCOSE: 102 mg/dL — AB (ref 70–99)
Potassium: 4.5 mEq/L (ref 3.5–5.3)
Sodium: 135 mEq/L (ref 135–145)
Total Protein: 7.5 g/dL (ref 6.0–8.3)

## 2014-07-09 LAB — CBC WITH DIFFERENTIAL/PLATELET
Basophils Absolute: 0 10*3/uL (ref 0.0–0.1)
Basophils Relative: 0 % (ref 0–1)
EOS ABS: 0.1 10*3/uL (ref 0.0–0.7)
EOS PCT: 2 % (ref 0–5)
HEMATOCRIT: 36.4 % — AB (ref 39.0–52.0)
Hemoglobin: 12.7 g/dL — ABNORMAL LOW (ref 13.0–17.0)
LYMPHS ABS: 1 10*3/uL (ref 0.7–4.0)
LYMPHS PCT: 19 % (ref 12–46)
MCH: 31 pg (ref 26.0–34.0)
MCHC: 34.9 g/dL (ref 30.0–36.0)
MCV: 88.8 fL (ref 78.0–100.0)
MONO ABS: 0.5 10*3/uL (ref 0.1–1.0)
Monocytes Relative: 10 % (ref 3–12)
Neutro Abs: 3.5 10*3/uL (ref 1.7–7.7)
Neutrophils Relative %: 69 % (ref 43–77)
Platelets: 323 10*3/uL (ref 150–400)
RBC: 4.1 MIL/uL — AB (ref 4.22–5.81)
RDW: 15.2 % (ref 11.5–15.5)
WBC: 5.1 10*3/uL (ref 4.0–10.5)

## 2014-07-10 LAB — T-HELPER CELL (CD4) - (RCID CLINIC ONLY)
CD4 T CELL HELPER: 23 % — AB (ref 33–55)
CD4 T Cell Abs: 260 /uL — ABNORMAL LOW (ref 400–2700)

## 2014-07-11 LAB — HIV-1 RNA QUANT-NO REFLEX-BLD
HIV 1 RNA Quant: 26 copies/mL — ABNORMAL HIGH (ref <20)
HIV-1 RNA Quant, Log: 1.41 {Log} — ABNORMAL HIGH (ref <1.30)

## 2014-07-23 ENCOUNTER — Ambulatory Visit: Payer: Self-pay | Admitting: Infectious Disease

## 2014-07-25 ENCOUNTER — Encounter: Payer: Self-pay | Admitting: Infectious Disease

## 2014-07-25 ENCOUNTER — Ambulatory Visit (INDEPENDENT_AMBULATORY_CARE_PROVIDER_SITE_OTHER): Payer: Self-pay | Admitting: Infectious Disease

## 2014-07-25 VITALS — BP 114/74 | HR 98 | Temp 98.5°F | Wt 136.5 lb

## 2014-07-25 DIAGNOSIS — I2510 Atherosclerotic heart disease of native coronary artery without angina pectoris: Secondary | ICD-10-CM

## 2014-07-25 DIAGNOSIS — J45909 Unspecified asthma, uncomplicated: Secondary | ICD-10-CM

## 2014-07-25 DIAGNOSIS — B20 Human immunodeficiency virus [HIV] disease: Secondary | ICD-10-CM

## 2014-07-25 DIAGNOSIS — J4540 Moderate persistent asthma, uncomplicated: Secondary | ICD-10-CM

## 2014-07-25 MED ORDER — DOLUTEGRAVIR SODIUM 50 MG PO TABS: 50.0000 mg | ORAL_TABLET | Freq: Every day | ORAL | Status: DC

## 2014-07-25 MED ORDER — ALBUTEROL SULFATE HFA 108 (90 BASE) MCG/ACT IN AERS: 2.0000 | INHALATION_SPRAY | Freq: Four times a day (QID) | RESPIRATORY_TRACT | Status: DC | PRN

## 2014-07-25 NOTE — Progress Notes (Signed)
Subjective:    Patient ID: Rodney Walker, male    DOB: 07/04/1981, 33 y.o.   MRN: [id number]  HPI   33 year old newly diagnosed African American man with HIV, and AIDS. 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   He had presented to the ED on several occasions this year with knee pain and ankle pain thought to be gout.   He also had DOE since December with fevers. CXR had shown RUL infiltrate and he was given azithromcyin and then bactrim DS bid after not tolerating azirhromycin.  He was found to have frank thrush and Rapid HIV EIA was positive now with confirmatory WB positive.  I had been alerted to his positive test via [facility] system and arranged for him to be seen today in clinic. He has met with Tish and filled out paperwork for [employer] and ADAP.  We placed him on Isentress twice daily along with Truvada and he had nice to work virological suppression and is in the in reconstitution is also taking place with a CD4 count above  200.  Lab Results  Component Value Date   HIV1RNAQUANT 26* 07/09/2014   Lab Results  Component Value Date   CD4TABS 260* 07/09/2014   CD4TABS 120* 04/19/2014   CD4TABS 20* 03/22/2014   He is still suffering from some asthmatic type symptoms especially when he gets in and out of a hot shower.   Review of Systems  Constitutional: Negative for activity change, appetite change and unexpected weight change.  HENT: Positive for sore throat. Negative for congestion, rhinorrhea, sinus pressure, sneezing and trouble swallowing.   Eyes: Negative for photophobia and visual disturbance.  Respiratory: Positive for cough and shortness of breath. Negative for chest tightness, wheezing and stridor.   Cardiovascular: Negative for chest pain and palpitations.  Gastrointestinal: Negative for nausea, vomiting, abdominal pain, diarrhea and abdominal  distention.  Genitourinary: Negative for dysuria, hematuria, flank pain and difficulty urinating.  Musculoskeletal: Positive for arthralgias. Negative for back pain, gait problem, joint swelling and myalgias.  Skin: Negative for color change, pallor, rash and wound.  Neurological: Negative for dizziness, tremors, weakness and light-headedness.  Hematological: Negative for adenopathy. Does not bruise/bleed easily.  Psychiatric/Behavioral: Negative for behavioral problems, confusion, sleep disturbance, dysphoric mood, decreased concentration and agitation.       Objective:   Physical Exam  Nursing note and vitals reviewed. Constitutional: He is oriented to person, place, and time. No distress.  HENT:  Head: Normocephalic and atraumatic.  Mouth/Throat: Posterior oropharyngeal erythema present.    Eyes: Conjunctivae and EOM are normal. Pupils are equal, round, and reactive to light. No scleral icterus.  Neck: Normal range of motion. Neck supple. No JVD present.  Cardiovascular: Normal rate, regular rhythm and normal heart sounds.  Exam reveals no gallop and no friction rub.   No murmur heard. Pulmonary/Chest: Effort normal and breath sounds normal. No respiratory distress. He has no wheezes. He has no rales. He exhibits no tenderness.  Abdominal: He exhibits no distension and no mass. There is no tenderness. There is no rebound and no guarding.  Musculoskeletal: He exhibits no edema and no tenderness.  Lymphadenopathy:    He has no cervical adenopathy.  Neurological: He is alert and oriented to person, place, and time. He has normal reflexes. He exhibits normal muscle tone. Coordination normal.  Skin: Skin is warm and dry. He  is not diaphoretic. No erythema. No pallor.  Psychiatric: He has a normal mood and affect. His behavior is normal. Judgment and thought content normal.          Assessment & Plan:    HIV/AIDS:  -- for dosing simplification change him to TIVICAY and  Truvada  --Fill out ADAP reapplication forms  Continue daily TMP/SMX until October which point time a C4 count still be above 200   I spent greater than 25 minutes with the patient including greater than 50% of time in face to face counsel of the patient and in coordination of their care.     He does have EXTENSIVE BULLOUS CHANGES IN THE LUNGS.+ RAD: --continue albuterol, and use prophylactically before showers.  He wishes to return to work and I was okay with him returning to work and the need to be nauseous and working near hot steam   CAD: will need to be on optimal meds for this as well but will address

## 2014-07-26 ENCOUNTER — Ambulatory Visit: Payer: Self-pay

## 2014-08-31 ENCOUNTER — Encounter (HOSPITAL_COMMUNITY): Payer: Self-pay | Admitting: Emergency Medicine

## 2014-08-31 ENCOUNTER — Emergency Department (HOSPITAL_COMMUNITY): Payer: No Typology Code available for payment source

## 2014-08-31 ENCOUNTER — Inpatient Hospital Stay (HOSPITAL_COMMUNITY)
Admission: EM | Admit: 2014-08-31 | Discharge: 2014-09-06 | DRG: 206 | Discharge status: Against Medical Advice | Payer: Self-pay | Attending: Internal Medicine | Admitting: Internal Medicine

## 2014-08-31 DIAGNOSIS — R918 Other nonspecific abnormal finding of lung field: Secondary | ICD-10-CM

## 2014-08-31 DIAGNOSIS — I252 Old myocardial infarction: Secondary | ICD-10-CM

## 2014-08-31 DIAGNOSIS — R911 Solitary pulmonary nodule: Secondary | ICD-10-CM | POA: Diagnosis present

## 2014-08-31 DIAGNOSIS — Z21 Asymptomatic human immunodeficiency virus [HIV] infection status: Secondary | ICD-10-CM | POA: Diagnosis present

## 2014-08-31 DIAGNOSIS — D7218 Eosinophilia in diseases classified elsewhere: Secondary | ICD-10-CM

## 2014-08-31 DIAGNOSIS — Z833 Family history of diabetes mellitus: Secondary | ICD-10-CM

## 2014-08-31 DIAGNOSIS — J189 Pneumonia, unspecified organism: Secondary | ICD-10-CM

## 2014-08-31 DIAGNOSIS — J984 Other disorders of lung: Principal | ICD-10-CM

## 2014-08-31 DIAGNOSIS — L731 Pseudofolliculitis barbae: Secondary | ICD-10-CM

## 2014-08-31 DIAGNOSIS — L738 Other specified follicular disorders: Secondary | ICD-10-CM

## 2014-08-31 DIAGNOSIS — Z2989 Encounter for other specified prophylactic measures: Secondary | ICD-10-CM

## 2014-08-31 DIAGNOSIS — B20 Human immunodeficiency virus [HIV] disease: Secondary | ICD-10-CM

## 2014-08-31 DIAGNOSIS — F1210 Cannabis abuse, uncomplicated: Secondary | ICD-10-CM | POA: Diagnosis present

## 2014-08-31 DIAGNOSIS — E785 Hyperlipidemia, unspecified: Secondary | ICD-10-CM | POA: Diagnosis present

## 2014-08-31 DIAGNOSIS — Z87891 Personal history of nicotine dependence: Secondary | ICD-10-CM

## 2014-08-31 DIAGNOSIS — E871 Hypo-osmolality and hyponatremia: Secondary | ICD-10-CM

## 2014-08-31 DIAGNOSIS — I428 Other cardiomyopathies: Secondary | ICD-10-CM | POA: Diagnosis present

## 2014-08-31 DIAGNOSIS — I2510 Atherosclerotic heart disease of native coronary artery without angina pectoris: Secondary | ICD-10-CM

## 2014-08-31 DIAGNOSIS — Z418 Encounter for other procedures for purposes other than remedying health state: Secondary | ICD-10-CM

## 2014-08-31 DIAGNOSIS — Z800 Family history of malignant neoplasm of digestive organs: Secondary | ICD-10-CM

## 2014-08-31 DIAGNOSIS — Z79899 Other long term (current) drug therapy: Secondary | ICD-10-CM

## 2014-08-31 LAB — BASIC METABOLIC PANEL
Anion gap: 10 (ref 5–15)
BUN: 9 mg/dL (ref 6–23)
CALCIUM: 9.5 mg/dL (ref 8.4–10.5)
CO2: 27 meq/L (ref 19–32)
Chloride: 99 mEq/L (ref 96–112)
Creatinine, Ser: 1.19 mg/dL (ref 0.50–1.35)
GFR calc Af Amer: >90 mL/min (ref >90)
GFR calc non Af Amer: 79 mL/min — ABNORMAL LOW (ref >90)
GLUCOSE: 77 mg/dL (ref 70–99)
POTASSIUM: 4.2 meq/L (ref 3.7–5.3)
SODIUM: 136 meq/L — AB (ref 137–147)

## 2014-08-31 LAB — CBC
HCT: 36.9 % — ABNORMAL LOW (ref 39.0–52.0)
HEMOGLOBIN: 12.7 g/dL — AB (ref 13.0–17.0)
MCH: 29.6 pg (ref 26.0–34.0)
MCHC: 34.4 g/dL (ref 30.0–36.0)
MCV: 86 fL (ref 78.0–100.0)
Platelets: 322 10*3/uL (ref 150–400)
RBC: 4.29 MIL/uL (ref 4.22–5.81)
RDW: 13.4 % (ref 11.5–15.5)
WBC: 6 10*3/uL (ref 4.0–10.5)

## 2014-08-31 LAB — I-STAT TROPONIN, ED: TROPONIN I, POC: 0 ng/mL (ref 0.00–0.08)

## 2014-08-31 MED ORDER — VANCOMYCIN HCL IN DEXTROSE 750-5 MG/150ML-% IV SOLN
750.0000 mg | Freq: Two times a day (BID) | INTRAVENOUS | Status: DC
  Administered 2014-08-31 – 2014-09-03 (×6): 750 mg via INTRAVENOUS
  Filled 2014-08-31 (×7): qty 150

## 2014-08-31 MED ORDER — ONDANSETRON HCL 4 MG/2ML IJ SOLN: 4.0000 mg | Freq: Four times a day (QID) | INTRAMUSCULAR | Status: DC | PRN

## 2014-08-31 MED ORDER — ASPIRIN 325 MG PO TABS
325.0000 mg | ORAL_TABLET | ORAL | Status: AC
  Administered 2014-08-31: 325 mg via ORAL
  Filled 2014-08-31: qty 1

## 2014-08-31 MED ORDER — OXYCODONE HCL 5 MG PO TABS: 5.0000 mg | ORAL_TABLET | ORAL | Status: DC | PRN

## 2014-08-31 MED ORDER — SULFAMETHOXAZOLE-TMP DS 800-160 MG PO TABS
1.0000 | ORAL_TABLET | Freq: Every day | ORAL | Status: DC
  Administered 2014-09-01: 1 via ORAL
  Filled 2014-08-31: qty 1

## 2014-08-31 MED ORDER — HYDROMORPHONE HCL PF 1 MG/ML IJ SOLN: 0.5000 mg | INTRAMUSCULAR | Status: DC | PRN

## 2014-08-31 MED ORDER — ALBUTEROL SULFATE (2.5 MG/3ML) 0.083% IN NEBU: 2.5000 mg | INHALATION_SOLUTION | Freq: Four times a day (QID) | RESPIRATORY_TRACT | Status: DC | PRN

## 2014-08-31 MED ORDER — SODIUM CHLORIDE 0.9 % IV SOLN
INTRAVENOUS | Status: DC
  Administered 2014-08-31 – 2014-09-01 (×2): via INTRAVENOUS

## 2014-08-31 MED ORDER — ONDANSETRON HCL 4 MG PO TABS: 4.0000 mg | ORAL_TABLET | Freq: Four times a day (QID) | ORAL | Status: DC | PRN

## 2014-08-31 MED ORDER — DOLUTEGRAVIR SODIUM 50 MG PO TABS
50.0000 mg | ORAL_TABLET | Freq: Every day | ORAL | Status: DC
  Administered 2014-09-01 – 2014-09-06 (×6): 50 mg via ORAL
  Filled 2014-08-31 (×6): qty 1

## 2014-08-31 MED ORDER — ALUM & MAG HYDROXIDE-SIMETH 200-200-20 MG/5ML PO SUSP: 30.0000 mL | Freq: Four times a day (QID) | ORAL | Status: DC | PRN

## 2014-08-31 MED ORDER — IOHEXOL 300 MG/ML  SOLN
100.0000 mL | Freq: Once | INTRAMUSCULAR | Status: AC | PRN
  Administered 2014-08-31: 100 mL via INTRAVENOUS

## 2014-08-31 MED ORDER — ACETAMINOPHEN 650 MG RE SUPP: 650.0000 mg | Freq: Four times a day (QID) | RECTAL | Status: DC | PRN

## 2014-08-31 MED ORDER — IBUPROFEN 400 MG PO TABS: 400.0000 mg | ORAL_TABLET | Freq: Three times a day (TID) | ORAL | Status: DC | PRN

## 2014-08-31 MED ORDER — ALBUTEROL SULFATE HFA 108 (90 BASE) MCG/ACT IN AERS: 2.0000 | INHALATION_SPRAY | Freq: Four times a day (QID) | RESPIRATORY_TRACT | Status: DC | PRN

## 2014-08-31 MED ORDER — ALBUTEROL SULFATE (2.5 MG/3ML) 0.083% IN NEBU: 2.5000 mg | INHALATION_SOLUTION | RESPIRATORY_TRACT | Status: DC | PRN

## 2014-08-31 MED ORDER — EMTRICITABINE-TENOFOVIR DF 200-300 MG PO TABS
1.0000 | ORAL_TABLET | Freq: Every day | ORAL | Status: DC
  Administered 2014-09-01 – 2014-09-06 (×6): 1 via ORAL
  Filled 2014-08-31 (×6): qty 1

## 2014-08-31 MED ORDER — ACETAMINOPHEN 325 MG PO TABS: 650.0000 mg | ORAL_TABLET | Freq: Four times a day (QID) | ORAL | Status: DC | PRN

## 2014-08-31 MED ORDER — DEXTROSE 5 % IV SOLN
1.0000 g | Freq: Two times a day (BID) | INTRAVENOUS | Status: DC
  Administered 2014-08-31 – 2014-09-05 (×10): 1 g via INTRAVENOUS
  Filled 2014-08-31 (×11): qty 1

## 2014-08-31 NOTE — ED Provider Notes (Signed)
CSN: [id number]     Arrival date & time 08/31/14  1303 History   First MD Initiated Contact with Patient 08/31/14 1538     Chief Complaint  Patient presents with  . Nasal Congestion  . Cough     (Consider location/radiation/quality/duration/timing/severity/associated sxs/prior Treatment) HPI Comments: Patient here complaining of 3 days of productive cough and congestion. States he feels as if he has a URI. No fever chills noted. No vomiting or diarrhea. Patient has been compliant with his HIV medications and states that his viral load is almost undetectable. Denies any dyspnea. There is no nasal congestion. Denies any sore throat or trouble swallowing. Denies any headache or meningismus. Symptoms persisted and not responsive to over-the-counter medications and nothing makes them better.  Patient is a 33 y.o. male presenting with cough. The history is provided by the patient.  Cough   Past Medical History  Diagnosis Date  . Chest pain 2010  . Hyperlipidemia   . Coronary artery disease     SPONTANEOUS DISSECTION AND PLAQUE RUPTURE OF HIS LEFT ANTERIOR DESCENDING ARTERY  . MI (myocardial infarction)   . HIV infection    Past Surgical History  Procedure Laterality Date  . Cardiac catheterization  06/24/2009    EF 60%  . Cardiac catheterization  06/17/2009    EF 45%. ANTERIOR HYPOKINESIS  . US echocardiography  12/09/2009    EF 55-60%   Family History  Problem Relation Age of Onset  . Diabetes Mother   . Liver cancer Father    History  Substance Use Topics  . Smoking status: Former Smoker    Types: Cigarettes    Quit date: 03/22/2014  . Smokeless tobacco: Never Used  . Alcohol Use: No    Review of Systems  Respiratory: Positive for cough.   All other systems reviewed and are negative.     Allergies  Review of patient's allergies indicates no known allergies.  Home Medications   Prior to Admission medications   Medication Sig Start Date End Date Taking?  Authorizing Provider  albuterol (PROVENTIL HFA;VENTOLIN HFA) 108 (90 BASE) MCG/ACT inhaler Inhale 2 puffs into the lungs every 6 (six) hours as needed for wheezing or shortness of breath. 07/25/14   Randall Hiss, MD  dolutegravir (TIVICAY) 50 MG tablet Take 1 tablet (50 mg total) by mouth daily. 07/25/14   Randall Hiss, MD  emtricitabine-tenofovir (TRUVADA) 200-300 MG per tablet Take 1 tablet by mouth daily. 04/03/14   Esperanza Sheets, MD  fluconazole (DIFLUCAN) 200 MG tablet Take 1 tablet (200 mg total) by mouth daily. 04/03/14   Esperanza Sheets, MD  HYDROcodone-ibuprofen (VICOPROFEN) 7.5-200 MG per tablet Take 1 tablet by mouth every 8 (eight) hours as needed for moderate pain. 04/23/14   Ginnie Smart, MD  ibuprofen (ADVIL,MOTRIN) 800 MG tablet Take 400 mg by mouth every 8 (eight) hours as needed (for pain.).    Historical Provider, MD  nystatin (MYCOSTATIN) 100000 UNIT/ML suspension Take 5 mLs (500,000 Units total) by mouth 3 (three) times daily. 04/11/14   Ginnie Smart, MD  sulfamethoxazole-trimethoprim (BACTRIM DS) 800-160 MG per tablet Take 1 tablet by mouth daily. 04/19/14   Judyann Munson, MD   BP 118/66  Pulse 92  Temp(Src) 98.1 F (36.7 C) (Oral)  Resp 16  Ht  (1.626 m)  Wt 145 lb (65.772 kg)  BMI 24.88 kg/m2  SpO2 98% Physical Exam  Nursing note and vitals reviewed. Constitutional: He is oriented to person,  place, and time. He appears well-developed and well-nourished.  Non-toxic appearance. No distress.  HENT:  Head: Normocephalic and atraumatic.  Eyes: Conjunctivae, EOM and lids are normal. Pupils are equal, round, and reactive to light.  Neck: Normal range of motion. Neck supple. No tracheal deviation present. No mass present.  Cardiovascular: Normal rate, regular rhythm and normal heart sounds.  Exam reveals no gallop.   No murmur heard. Pulmonary/Chest: Effort normal and breath sounds normal. No stridor. No respiratory distress. He has no decreased  breath sounds. He has no wheezes. He has no rhonchi. He has no rales.  Abdominal: Soft. Normal appearance and bowel sounds are normal. He exhibits no distension. There is no tenderness. There is no rebound and no CVA tenderness.  Musculoskeletal: Normal range of motion. He exhibits no edema and no tenderness.  Neurological: He is alert and oriented to person, place, and time. He has normal strength. No cranial nerve deficit or sensory deficit. GCS eye subscore is 4. GCS verbal subscore is 5. GCS motor subscore is 6.  Skin: Skin is warm and dry. No abrasion and no rash noted.  Psychiatric: He has a normal mood and affect. His speech is normal and behavior is normal.    ED Course  Procedures (including critical care time) Labs Review Labs Reviewed  CBC - Abnormal; Notable for the following:    Hemoglobin 12.7 (*)    HCT 36.9 (*)    All other components within normal limits  BASIC METABOLIC PANEL - Abnormal; Notable for the following:    Sodium 136 (*)    GFR calc non Af Amer 79 (*)    All other components within normal limits  I-STAT TROPOININ, ED    Imaging Review No results found.   EKG Interpretation None      MDM   Final diagnoses:  None    Patient to be admitted for evaluation of possible TB    Toy Baker, MD 08/31/14 2050

## 2014-08-31 NOTE — H&P (Addendum)
[facility] Admission History and Physical       Rodney Walker UEA:[id number] DOB: May 11, 1981 DOA: 08/31/2014  Referring physician:  PCP: No PCP Per Patient  Specialists:   Chief Complaint: Cough and Chest Pain  HPI: Rodney Walker is a 33 y.o. male with HIV/AIDS who presents to the ED with complaints of productive cough and chest congestion x 3 days, he denies fevers or chills and hemoptysis.  He reports coughing up greenish sputum.   He has chest pain with coughing.   He reports taking his HAART Rx daily and has only missed 1 dose.  He reports that his CD4 count at the first of this month was in the 200's.    His ID physician is Dr Daiva Eves.    He was found to have a cavitating mass in the right middle lobe.    He was placed in isolation and referred for admission.      Review of Systems:  Constitutional: No Weight Loss, No Weight Gain, Night Sweats, Fevers, Chills, Dizziness, Fatigue, or Generalized Weakness HEENT: No Headaches, Difficulty Swallowing,Tooth/Dental Problems,Sore Throat,  No Sneezing, Rhinitis, Ear Ache, Nasal Congestion, or Post Nasal Drip,  Cardio-vascular:  No Chest pain, Orthopnea, PND, Edema in Lower Extremities, Anasarca, Dizziness, Palpitations  Resp: No Dyspnea, No DOE, +Productive Cough, No Hemoptysis, No Wheezing.    GI: No Heartburn, Indigestion, Abdominal Pain, Nausea, Vomiting, Diarrhea, Hematemesis, Hematochezia, Melena, Change in Bowel Habits,  Loss of Appetite  GU: No Dysuria, Change in Color of Urine, No Urgency or Frequency, No Flank pain.  Musculoskeletal: No Joint Pain or Swelling, No Decreased Range of Motion, No Back Pain.  Neurologic: No Syncope, No Seizures, Muscle Weakness, Paresthesia, Vision Disturbance or Loss, No Diplopia, No Vertigo, No Difficulty Walking,  Skin: No Rash or Lesions. Psych: No Change in Mood or Affect, No Depression or Anxiety, No Memory loss, No Confusion, or Hallucinations   Past Medical History   Diagnosis Date  . Chest pain 2010  . Hyperlipidemia   . Coronary artery disease     SPONTANEOUS DISSECTION AND PLAQUE RUPTURE OF HIS LEFT ANTERIOR DESCENDING ARTERY  . MI (myocardial infarction)   . HIV infection       Past Surgical History  Procedure Laterality Date  . Cardiac catheterization  06/24/2009    EF 60%  . Cardiac catheterization  06/17/2009    EF 45%. ANTERIOR HYPOKINESIS  . US echocardiography  12/09/2009    EF 55-60%       Prior to Admission medications   Medication Sig Start Date End Date Taking? Authorizing Provider  albuterol (PROVENTIL HFA;VENTOLIN HFA) 108 (90 BASE) MCG/ACT inhaler Inhale 2 puffs into the lungs every 6 (six) hours as needed for wheezing or shortness of breath. 07/25/14  Yes Randall Hiss, MD  dolutegravir (TIVICAY) 50 MG tablet Take 1 tablet (50 mg total) by mouth daily. 07/25/14  Yes Randall Hiss, MD  emtricitabine-tenofovir (TRUVADA) 200-300 MG per tablet Take 1 tablet by mouth daily. 04/03/14  Yes Esperanza Sheets, MD  ibuprofen (ADVIL,MOTRIN) 800 MG tablet Take 400 mg by mouth every 8 (eight) hours as needed (for pain.).   Yes Historical Provider, MD  sulfamethoxazole-trimethoprim (BACTRIM DS) 800-160 MG per tablet Take 1 tablet by mouth daily. 04/19/14  Yes Judyann Munson, MD      No Known Allergies   Social History:  reports that he quit smoking about 5 months ago. His smoking use included Cigarettes. He smoked 0.00 packs  per day. He has never used smokeless tobacco. He reports that he uses illicit drugs (Marijuana). He reports that he does not drink alcohol.     Family History  Problem Relation Age of Onset  . Diabetes Mother   . Liver cancer Father        Physical Exam:  GEN:  Pleasant Thin 33 y.o. African American  male examined and in no acute distress; cooperative with exam Filed Vitals:   08/31/14 1324 08/31/14 1600 08/31/14 1700  BP: 118/66 113/72 106/62  Pulse: 92 85 72  Temp: 98.1 F (36.7 C)      TempSrc: Oral    Resp: 16 15 21   Height: 5\' 4"  (1.626 m)    Weight: 65.772 kg (145 lb)    SpO2: 98% 99% 99%   Blood pressure 106/62, pulse 72, temperature 98.1 F (36.7 C), temperature source Oral, resp. rate 21, height 5\' 4"  (1.626 m), weight 65.772 kg (145 lb), SpO2 99.00%. PSYCH: He is alert and oriented x4; does not appear anxious does not appear depressed; affect is normal HEENT: Normocephalic and Atraumatic, Mucous membranes pink; PERRLA; EOM intact; Fundi:  Benign;  No scleral icterus, Nares: Patent, Oropharynx: Clear, Fair Dentition,    Neck:  FROM, No Cervical Lymphadenopathy nor Thyromegaly or Carotid Bruit; No JVD; Breasts:: Not examined CHEST WALL: No tenderness CHEST: Normal respiration, clear to auscultation bilaterally HEART: Regular rate and rhythm; no murmurs rubs or gallops BACK: No kyphosis or scoliosis; No CVA tenderness ABDOMEN: Positive Bowel Sounds, Scaphoid, Soft Non-Tender; No Masses, No Organomegaly. Rectal Exam: Not done EXTREMITIES: No Cyanosis, Clubbing, or Edema; No Ulcerations. Genitalia: not examined PULSES: 2+ and symmetric SKIN: Normal hydration no rash or ulceration CNS:  A x O x 4, No Focal Deficits   Labs on Admission:  Basic Metabolic Panel:  Recent Labs Lab 08/31/14 1339  NA 136*  K 4.2  CL 99  CO2 27  GLUCOSE 77  BUN 9  CREATININE 1.19  CALCIUM 9.5   Liver Function Tests: No results found for this basename: AST, ALT, ALKPHOS, BILITOT, PROT, ALBUMIN,  in the last 168 hours No results found for this basename: LIPASE, AMYLASE,  in the last 168 hours No results found for this basename: AMMONIA,  in the last 168 hours CBC:  Recent Labs Lab 08/31/14 1339  WBC 6.0  HGB 12.7*  HCT 36.9*  MCV 86.0  PLT 322   Cardiac Enzymes: No results found for this basename: CKTOTAL, CKMB, CKMBINDEX, TROPONINI,  in the last 168 hours  BNP (last 3 results)  Recent Labs  03/18/14 1130  PROBNP 60.8   CBG: No results found for this  basename: GLUCAP,  in the last 168 hours  Radiological Exams on Admission: Dg Chest 2 View  08/31/2014   CLINICAL DATA:  Chest pain and cough congestion 2 days history of smoking and COPD  EXAM: CHEST  2 VIEW  COMPARISON:  04/22/2014  FINDINGS: Heart size and vascular pattern are normal. Left lung is clear with resolution of upper lobe infiltrate. On the right, there is bullous change in the upper lobe as seen previously. However, there is now also a rounded 32 x 37 mm suprahilar masslike density. No pleural effusion. Hyperinflation consistent with COPD, stable.  IMPRESSION: Recommend contrast-enhanced CT thorax to further evaluate rounded masslike density right upper lobe, suspicious for malignancy.   Electronically Signed   By: Esperanza Heir M.D.   On: 08/31/2014 17:33   Ct Chest W Contrast  08/31/2014  CLINICAL DATA:  Cough, abnormal chest radiograph, HIV.  EXAM: CT CHEST WITH CONTRAST  TECHNIQUE: Multidetector CT imaging of the chest was performed during intravenous contrast administration.  CONTRAST:  OMNIPAQUE IOHEXOL 300 MG/ML  SOLN  COMPARISON:  Chest radiograph dated 08/31/2014. CTA chest dated 03/18/2014.  FINDINGS: 3.8 x 2.7 x 2.8 cm soft tissue mass in the medial right upper lobe (series 3/ image 21), accounting for the radiographic abnormality, new from prior CT. Lesion appears low-density/ necrotic on soft tissue windows (series 5/ image 45). Associated clustered satellite nodularity in the right upper lobe (series 3/ images 19-22).  Additional minimal clustered nodularity in the posterior left upper lobe (series 3/image 20) with an additional 3-4 mm nodule in the lateral left upper lobe (series 3/image 14). Underlying moderate paraseptal emphysematous changes with bullous changes at the right lung apex.  Overall, this appearance is most suggestive of infection, possibly atypical infection such as TB. HIV related malignancies including Kaposi's sarcoma and lymphoma remain possible. Given  the patient's age, as well as the lack of any corresponding abnormality on prior CT, primary bronchogenic neoplasm is considered unlikely, although the patient does have underlying emphysematous changes.  Visualized thyroid is unremarkable.  The heart is normal in size.  No pericardial effusion.  Thoracic lymphadenopathy, including:  --13 mm short axis high right paratracheal node (series 2/ image 15)  --11 mm short axis necrotic right paratracheal node (series 2/image 20)  --10 mm short axis right hilar node (series 2/image 28)  Visualized upper abdomen is unremarkable.  Visualized osseous structures are within normal limits.  IMPRESSION: 3.8 cm soft tissue mass in the medial right upper lobe with associated right upper lobe clustered nodularity.  This appearance is most suggestive of infection, possibly atypical infection such as TB. HIV related malignancies including Kaposi's sarcoma and lymphoma remain possible. Primary bronchogenic neoplasm is considered unlikely.  Consider sputum culture and/or bronchoscopy as clinically warranted. At a minimum, follow-up imaging is suggested to document resolution following appropriate antimicrobial therapy.   Electronically Signed   By: Charline Bills M.D.   On: 08/31/2014 19:12        Assessment/Plan:   33 y.o. male with  Principal Problem:   1.   Lung mass- Rule out TB, Lung Cancer   Airborne Precautions, BC x2,  Placed on IV Vancomycin and Cefepime   Continuous O2 monitoring, O2 PRN   Sputum for AFB q AM x 3   ID consult in AM   Pulmonary Consult in AM      Active Problems:   2.   HIV disease   Continue HAART Rx     3.   CAD (coronary artery disease)   stable     4.    DVT Prophylaxis   SCDs       Code Status:   FULL CODE Family Communication:    No Family at Bedside Disposition Plan:     Inpatient  Time spent:  2 Minutes  Ron Parker [facility] Pager [phone]   If 7AM -7PM Please Contact the Day Rounding  Team MD for [facility]  If 7PM-7AM, Please Contact night-coverage  [email or web address] Password [email or web address] 08/31/2014, 9:21 PM

## 2014-08-31 NOTE — Progress Notes (Signed)
ANTIBIOTIC CONSULT NOTE - INITIAL  Pharmacy Consult for Vancomycin, Cefepime  Indication: HCAP  No Known Allergies  Patient Measurements: Height: 5\' 4"  (162.6 cm) Weight: 145 lb (65.772 kg) IBW/kg (Calculated) : 59.2 Adjusted Body Weight: n/a   Vital Signs: Temp: 98.1 F (36.7 C) (09/04 1324) Temp src: Oral (09/04 1324) BP: 106/62 mmHg (09/04 1700) Pulse Rate: 72 (09/04 1700) Intake/Output from previous day:   Intake/Output from this shift:    Labs:  Recent Labs  08/31/14 1339  WBC 6.0  HGB 12.7*  PLT 322  CREATININE 1.19   Estimated Creatinine Clearance: 73.9 ml/min (by C-G formula based on Cr of 1.19). No results found for this basename: VANCOTROUGH, VANCOPEAK, VANCORANDOM, GENTTROUGH, GENTPEAK, GENTRANDOM, TOBRATROUGH, TOBRAPEAK, TOBRARND, AMIKACINPEAK, AMIKACINTROU, AMIKACIN,  in the last 72 hours   Microbiology: No results found for this or any previous visit (from the past 720 hour(s)).  Medical History: Past Medical History  Diagnosis Date  . Chest pain 2010  . Hyperlipidemia   . Coronary artery disease     SPONTANEOUS DISSECTION AND PLAQUE RUPTURE OF HIS LEFT ANTERIOR DESCENDING ARTERY  . MI (myocardial infarction)   . HIV infection     Medications:   (Not in a hospital admission) Assessment: 15 YOM with HIV/AIDS who presents to the ED with CC of productive cough and chest congestion x 3 days. He was found to have a cavitating mass in the right middle lobe. Pharmacy consulted to start empiric antibiotics for HCAP vs r/o TB. WBC wnl. Pt is afebrile. CrCl ~ 70-75 mL/min   Cultures: 9/4: Blood Cx x2>>   Goal of Therapy:  Vancomycin trough level 15-20 mcg/ml  Plan:  1) Start patient on Vanomcyin 750 mg IV Q 12 hours and Cefepime 1 gm IV Q 12 hours  2) Monitor CBC, renal fx, cultures and patient's clinical progress 3) [facility] at [facility], PharmD.  Clinical Pharmacist Pager [phone]

## 2014-08-31 NOTE — ED Notes (Signed)
Pt reports 3 day hx of productive green cough, congestion and pain with coughing. Pt denies fever. Reports hx of HIV, taking all medications. VSS, NAD at present.

## 2014-09-01 DIAGNOSIS — J984 Other disorders of lung: Principal | ICD-10-CM

## 2014-09-01 DIAGNOSIS — B20 Human immunodeficiency virus [HIV] disease: Secondary | ICD-10-CM

## 2014-09-01 LAB — BASIC METABOLIC PANEL
Anion gap: 11 (ref 5–15)
BUN: 10 mg/dL (ref 6–23)
CO2: 25 meq/L (ref 19–32)
Calcium: 8.7 mg/dL (ref 8.4–10.5)
Chloride: 103 mEq/L (ref 96–112)
Creatinine, Ser: 1.19 mg/dL (ref 0.50–1.35)
GFR calc Af Amer: >90 mL/min (ref >90)
GFR calc non Af Amer: 79 mL/min — ABNORMAL LOW (ref >90)
GLUCOSE: 98 mg/dL (ref 70–99)
POTASSIUM: 3.8 meq/L (ref 3.7–5.3)
Sodium: 139 mEq/L (ref 137–147)

## 2014-09-01 LAB — CBC
HCT: 34.8 % — ABNORMAL LOW (ref 39.0–52.0)
HEMOGLOBIN: 12.6 g/dL — AB (ref 13.0–17.0)
MCH: 31.3 pg (ref 26.0–34.0)
MCHC: 36.2 g/dL — ABNORMAL HIGH (ref 30.0–36.0)
MCV: 86.6 fL (ref 78.0–100.0)
Platelets: 258 10*3/uL (ref 150–400)
RBC: 4.02 MIL/uL — ABNORMAL LOW (ref 4.22–5.81)
RDW: 13.9 % (ref 11.5–15.5)
WBC: 5 10*3/uL (ref 4.0–10.5)

## 2014-09-01 NOTE — Progress Notes (Signed)
[facility] PROGRESS NOTE  Rodney Walker GNF:[id number] DOB: 1981-06-29 DOA: 08/31/2014 PCP: No PCP Per Patient  Assessment/Plan: 33 y.o. male with HIV/AIDS who presents to the ED with complaints of productive cough and chest congestion found to have lung mass, with cavitary lesion  1. Lung mass- Rule out TB, Lung Cancer -CT: 3.8 cm soft tissue mass in the medial right upper lobe with associated right upper lobe clustered nodularity -called pulmonology for broch/biopsy eval; cont isolation r/o TB, atypical inf; check gold TB, called ID eval   2. HIV cont HAART;  3. Emphysema; check alpha-1 antitrypsin deficiency   Code Status: full Family Communication:  D/w patient; spoken with, relationship, and if by phone, the number) Disposition Plan: home pend clinical imrprovement    Consultants: Pccm, ID Procedures: none Antibiotics:  Cefepime, vanc 9/5<<<< and stop date if known)  HPI/Subjective: alert Objective: Filed Vitals:   09/01/14 0600  BP: 101/61  Pulse: 61  Temp: 98.2 F (36.8 C)  Resp: 18   No intake or output data in the 24 hours ending 09/01/14 0927 Filed Weights   08/31/14 1324  Weight: 65.772 kg (145 lb)    Exam:   General:  alert  Cardiovascular: s1,s2 rrr  Respiratory: diminished in upper lobes   Abdomen: soft, nt,nd   Musculoskeletal: no LE edmea   Data Reviewed: Basic Metabolic Panel:  Recent Labs Lab 08/31/14 1339 09/01/14 0402  NA 136* 139  K 4.2 3.8  CL 99 103  CO2 27 25  GLUCOSE 77 98  BUN 9 10  CREATININE 1.19 1.19  CALCIUM 9.5 8.7   Liver Function Tests: No results found for this basename: AST, ALT, ALKPHOS, BILITOT, PROT, ALBUMIN,  in the last 168 hours No results found for this basename: LIPASE, AMYLASE,  in the last 168 hours No results found for this basename: AMMONIA,  in the last 168 hours CBC:  Recent Labs Lab 08/31/14 1339 09/01/14 0402  WBC 6.0 5.0  HGB 12.7* 12.6*  HCT 36.9* 34.8*  MCV  86.0 86.6  PLT 322 258   Cardiac Enzymes: No results found for this basename: CKTOTAL, CKMB, CKMBINDEX, TROPONINI,  in the last 168 hours BNP (last 3 results)  Recent Labs  03/18/14 1130  PROBNP 60.8   CBG: No results found for this basename: GLUCAP,  in the last 168 hours  No results found for this or any previous visit (from the past 240 hour(s)).   Studies: Dg Chest 2 View  08/31/2014   CLINICAL DATA:  Chest pain and cough congestion 2 days history of smoking and COPD  EXAM: CHEST  2 VIEW  COMPARISON:  04/22/2014  FINDINGS: Heart size and vascular pattern are normal. Left lung is clear with resolution of upper lobe infiltrate. On the right, there is bullous change in the upper lobe as seen previously. However, there is now also a rounded 32 x 37 mm suprahilar masslike density. No pleural effusion. Hyperinflation consistent with COPD, stable.  IMPRESSION: Recommend contrast-enhanced CT thorax to further evaluate rounded masslike density right upper lobe, suspicious for malignancy.   Electronically Signed   By: Esperanza Heir M.D.   On: 08/31/2014 17:33   Ct Chest W Contrast  08/31/2014   CLINICAL DATA:  Cough, abnormal chest radiograph, HIV.  EXAM: CT CHEST WITH CONTRAST  TECHNIQUE: Multidetector CT imaging of the chest was performed during intravenous contrast administration.  CONTRAST:  OMNIPAQUE IOHEXOL 300 MG/ML  SOLN  COMPARISON:  Chest radiograph dated 08/31/2014. CTA  chest dated 03/18/2014.  FINDINGS: 3.8 x 2.7 x 2.8 cm soft tissue mass in the medial right upper lobe (series 3/ image 21), accounting for the radiographic abnormality, new from prior CT. Lesion appears low-density/ necrotic on soft tissue windows (series 5/ image 45). Associated clustered satellite nodularity in the right upper lobe (series 3/ images 19-22).  Additional minimal clustered nodularity in the posterior left upper lobe (series 3/image 20) with an additional 3-4 mm nodule in the lateral left upper lobe  (series 3/image 14). Underlying moderate paraseptal emphysematous changes with bullous changes at the right lung apex.  Overall, this appearance is most suggestive of infection, possibly atypical infection such as TB. HIV related malignancies including Kaposi's sarcoma and lymphoma remain possible. Given the patient's age, as well as the lack of any corresponding abnormality on prior CT, primary bronchogenic neoplasm is considered unlikely, although the patient does have underlying emphysematous changes.  Visualized thyroid is unremarkable.  The heart is normal in size.  No pericardial effusion.  Thoracic lymphadenopathy, including:  --13 mm short axis high right paratracheal node (series 2/ image 15)  --11 mm short axis necrotic right paratracheal node (series 2/image 20)  --10 mm short axis right hilar node (series 2/image 28)  Visualized upper abdomen is unremarkable.  Visualized osseous structures are within normal limits.  IMPRESSION: 3.8 cm soft tissue mass in the medial right upper lobe with associated right upper lobe clustered nodularity.  This appearance is most suggestive of infection, possibly atypical infection such as TB. HIV related malignancies including Kaposi's sarcoma and lymphoma remain possible. Primary bronchogenic neoplasm is considered unlikely.  Consider sputum culture and/or bronchoscopy as clinically warranted. At a minimum, follow-up imaging is suggested to document resolution following appropriate antimicrobial therapy.   Electronically Signed   By: Charline Bills M.D.   On: 08/31/2014 19:12    Scheduled Meds: . ceFEPime (MAXIPIME) IV  1 g Intravenous Q12H  . dolutegravir  50 mg Oral Daily  . emtricitabine-tenofovir  1 tablet Oral Daily  . sulfamethoxazole-trimethoprim  1 tablet Oral Daily  . vancomycin  750 mg Intravenous Q12H   Continuous Infusions: . sodium chloride 100 mL/hr at 08/31/14 2258    Principal Problem:   Lung mass Active Problems:   CAD (coronary artery  disease)   HIV disease    Time spent: >35 minutes     Esperanza Sheets  [facility] Pager [phone]. If 7PM-7AM, please contact night-coverage at [email or web address], password [facility] 09/01/2014, 9:27 AM  LOS: 1 day

## 2014-09-01 NOTE — Consult Note (Addendum)
[facility]  Date of Admission:  08/31/2014  Date of Consult:  09/01/2014  Reason for Consult: AIDS, Lung mass Referring Physician: Daleen Bo  Impression/Recommendation Lung Mass AIDS Prev PCP Previous indeterminant quantiferon gold  Would: Await BAL/Bx Recheck CD4 and HIV RNA Continue his current ART Stop bactrim (he has gotten 5 months since his presumed PCP)  Comment- He could have immune reconstitution syndrome now that he has increased his CD4. This could be a manifestation of KS (~20% have lung only), MAI, TB. My great appreciation to CCM  Thank you so much for this truly interesting consult,   Bobby Rumpf (pager) [phone] [email or web address]  Rodney Walker is an 33 y.o. male.  HPI: 33 yo M with hx of AIDS (dx March 2015, CD4 260, HIV RNA 26, on DTGV/TRV) and CAD/LAD dissection.  He comes to the ED on 9-4 with 3 days of cough and greenish sputum. On evaluation he was afebrile, non-hypoxic and had WBC 6.0.  He had CXR and then CT showing: 3.8 cm soft tissue mass in the medial right upper lobe with associated right upper lobe clustered nodularity. This appearance is most suggestive of infection, possibly atypical infection such as TB. HIV related malignancies including Kaposi's sarcoma and lymphoma remain possible. Primary bronchogenic neoplasm is considered unlikely.  He was started on vanco/cefepime. Continued on his home ART, OI medications.    Past Medical History  Diagnosis Date  . Chest pain 2010  . Hyperlipidemia   . Coronary artery disease     SPONTANEOUS DISSECTION AND PLAQUE RUPTURE OF HIS LEFT ANTERIOR DESCENDING ARTERY  . MI (myocardial infarction)   . HIV infection     Past Surgical History  Procedure Laterality Date  . Cardiac catheterization  06/24/2009    EF 60%  . Cardiac catheterization  06/17/2009    EF 45%. ANTERIOR HYPOKINESIS  . US echocardiography  12/09/2009    EF 55-60%     No Known  Allergies  Medications:  Scheduled: . ceFEPime (MAXIPIME) IV  1 g Intravenous Q12H  . dolutegravir  50 mg Oral Daily  . emtricitabine-tenofovir  1 tablet Oral Daily  . sulfamethoxazole-trimethoprim  1 tablet Oral Daily  . vancomycin  750 mg Intravenous Q12H    Abtx:  Anti-infectives   Start     Dose/Rate Route Frequency Ordered Stop   09/01/14 1000  dolutegravir (TIVICAY) tablet 50 mg     50 mg Oral Daily 08/31/14 2236     09/01/14 1000  sulfamethoxazole-trimethoprim (BACTRIM DS) 800-160 MG per tablet 1 tablet     1 tablet Oral Daily 08/31/14 2236     09/01/14 1000  emtricitabine-tenofovir (TRUVADA) 200-300 MG per tablet 1 tablet     1 tablet Oral Daily 08/31/14 2236     08/31/14 2200  ceFEPIme (MAXIPIME) 1 g in dextrose 5 % 50 mL IVPB     1 g 100 mL/hr over 30 Minutes Intravenous Every 12 hours 08/31/14 2145     08/31/14 2200  vancomycin (VANCOCIN) IVPB 750 mg/150 ml premix     750 mg 150 mL/hr over 60 Minutes Intravenous Every 12 hours 08/31/14 2145        Total days of antibiotics: 2 (vanco/cefepime)          Social History:  reports that he quit smoking about 5 months ago. His smoking use included Cigarettes. He smoked 0.00 packs per day. He has never used smokeless tobacco. He reports that he uses illicit drugs (Marijuana).  He reports that he does not drink alcohol.  Family History  Problem Relation Age of Onset  . Diabetes Mother   . Liver cancer Father     General ROS: no thrush, wt increasing, no LAN, no f/c, no missed ART, see HPI.   Blood pressure 101/61, pulse 61, temperature 98.2 F (36.8 C), temperature source Oral, resp. rate 18, height 5' 4"  (1.626 m), weight 65.772 kg (145 lb), SpO2 100.00%. General appearance: alert, cooperative and no distress Eyes: negative findings: conjunctivae and sclerae normal and pupils equal, round, reactive to light and accomodation Throat: lips, mucosa, and tongue normal; teeth and gums normal Neck: no adenopathy and supple,  symmetrical, trachea midline Lungs: clear to auscultation bilaterally Heart: regular rate and rhythm Abdomen: normal findings: bowel sounds normal and soft, non-tender Extremities: edema none No cervical, supraclavicular, axillary LAN   Results for orders placed during the hospital encounter of 08/31/14 (from the past 48 hour(s))  CBC     Status: Abnormal   Collection Time    08/31/14  1:39 PM      Result Value Ref Range   WBC 6.0  4.0 - 10.5 K/uL   RBC 4.29  4.22 - 5.81 MIL/uL   Hemoglobin 12.7 (*) 13.0 - 17.0 g/dL   HCT 36.9 (*) 39.0 - 52.0 %   MCV 86.0  78.0 - 100.0 fL   MCH 29.6  26.0 - 34.0 pg   MCHC 34.4  30.0 - 36.0 g/dL   RDW 13.4  11.5 - 15.5 %   Platelets 322  150 - 400 K/uL  BASIC METABOLIC PANEL     Status: Abnormal   Collection Time    08/31/14  1:39 PM      Result Value Ref Range   Sodium 136 (*) 137 - 147 mEq/L   Potassium 4.2  3.7 - 5.3 mEq/L   Chloride 99  96 - 112 mEq/L   CO2 27  19 - 32 mEq/L   Glucose, Bld 77  70 - 99 mg/dL   BUN 9  6 - 23 mg/dL   Creatinine, Ser 1.19  0.50 - 1.35 mg/dL   Calcium 9.5  8.4 - 10.5 mg/dL   GFR calc non Af Amer 79 (*) >90 mL/min   GFR calc Af Amer >90  >90 mL/min   Comment: (NOTE)     The eGFR has been calculated using the CKD EPI equation.     This calculation has not been validated in all clinical situations.     eGFR's persistently <90 mL/min signify possible Chronic Kidney     Disease.   Anion gap 10  5 - 15  I-STAT TROPOININ, ED     Status: None   Collection Time    08/31/14  2:50 PM      Result Value Ref Range   Troponin i, poc 0.00  0.00 - 0.08 ng/mL   Comment 3            Comment: Due to the release kinetics of cTnI,     a negative result within the first hours     of the onset of symptoms does not rule out     myocardial infarction with certainty.     If myocardial infarction is still suspected,     repeat the test at appropriate intervals.  BASIC METABOLIC PANEL     Status: Abnormal   Collection Time     09/01/14  4:02 AM      Result Value Ref  Range   Sodium 139  137 - 147 mEq/L   Potassium 3.8  3.7 - 5.3 mEq/L   Chloride 103  96 - 112 mEq/L   CO2 25  19 - 32 mEq/L   Glucose, Bld 98  70 - 99 mg/dL   BUN 10  6 - 23 mg/dL   Creatinine, Ser 1.19  0.50 - 1.35 mg/dL   Calcium 8.7  8.4 - 10.5 mg/dL   GFR calc non Af Amer 79 (*) >90 mL/min   GFR calc Af Amer >90  >90 mL/min   Comment: (NOTE)     The eGFR has been calculated using the CKD EPI equation.     This calculation has not been validated in all clinical situations.     eGFR's persistently <90 mL/min signify possible Chronic Kidney     Disease.   Anion gap 11  5 - 15  CBC     Status: Abnormal   Collection Time    09/01/14  4:02 AM      Result Value Ref Range   WBC 5.0  4.0 - 10.5 K/uL   RBC 4.02 (*) 4.22 - 5.81 MIL/uL   Hemoglobin 12.6 (*) 13.0 - 17.0 g/dL   HCT 34.8 (*) 39.0 - 52.0 %   MCV 86.6  78.0 - 100.0 fL   MCH 31.3  26.0 - 34.0 pg   MCHC 36.2 (*) 30.0 - 36.0 g/dL   RDW 13.9  11.5 - 15.5 %   Platelets 258  150 - 400 K/uL      Component Value Date/Time   SDES URINE, RANDOM 04/22/2014 1350   SPECREQUEST NONE 04/22/2014 1350   CULT  Value: NO GROWTH Performed at [facility] 04/22/2014 1350   REPTSTATUS 04/23/2014 FINAL 04/22/2014 1350   Dg Chest 2 View  08/31/2014   CLINICAL DATA:  Chest pain and cough congestion 2 days history of smoking and COPD  EXAM: CHEST  2 VIEW  COMPARISON:  04/22/2014  FINDINGS: Heart size and vascular pattern are normal. Left lung is clear with resolution of upper lobe infiltrate. On the right, there is bullous change in the upper lobe as seen previously. However, there is now also a rounded 32 x 37 mm suprahilar masslike density. No pleural effusion. Hyperinflation consistent with COPD, stable.  IMPRESSION: Recommend contrast-enhanced CT thorax to further evaluate rounded masslike density right upper lobe, suspicious for malignancy.   Electronically Signed   By: Skipper Cliche M.D.   On:  08/31/2014 17:33   Ct Chest W Contrast  08/31/2014   CLINICAL DATA:  Cough, abnormal chest radiograph, HIV.  EXAM: CT CHEST WITH CONTRAST  TECHNIQUE: Multidetector CT imaging of the chest was performed during intravenous contrast administration.  CONTRAST:  128m OMNIPAQUE IOHEXOL 300 MG/ML  SOLN  COMPARISON:  Chest radiograph dated 08/31/2014. CTA chest dated 03/18/2014.  FINDINGS: 3.8 x 2.7 x 2.8 cm soft tissue mass in the medial right upper lobe (series 3/ image 21), accounting for the radiographic abnormality, new from prior CT. Lesion appears low-density/ necrotic on soft tissue windows (series 5/ image 45). Associated clustered satellite nodularity in the right upper lobe (series 3/ images 19-22).  Additional minimal clustered nodularity in the posterior left upper lobe (series 3/image 20) with an additional 3-4 mm nodule in the lateral left upper lobe (series 3/image 14). Underlying moderate paraseptal emphysematous changes with bullous changes at the right lung apex.  Overall, this appearance is most suggestive of infection, possibly atypical infection such as  TB. HIV related malignancies including Kaposi's sarcoma and lymphoma remain possible. Given the patient's age, as well as the lack of any corresponding abnormality on prior CT, primary bronchogenic neoplasm is considered unlikely, although the patient does have underlying emphysematous changes.  Visualized thyroid is unremarkable.  The heart is normal in size.  No pericardial effusion.  Thoracic lymphadenopathy, including:  --13 mm short axis high right paratracheal node (series 2/ image 15)  --11 mm short axis necrotic right paratracheal node (series 2/image 20)  --10 mm short axis right hilar node (series 2/image 28)  Visualized upper abdomen is unremarkable.  Visualized osseous structures are within normal limits.  IMPRESSION: 3.8 cm soft tissue mass in the medial right upper lobe with associated right upper lobe clustered nodularity.  This  appearance is most suggestive of infection, possibly atypical infection such as TB. HIV related malignancies including Kaposi's sarcoma and lymphoma remain possible. Primary bronchogenic neoplasm is considered unlikely.  Consider sputum culture and/or bronchoscopy as clinically warranted. At a minimum, follow-up imaging is suggested to document resolution following appropriate antimicrobial therapy.   Electronically Signed   By: Julian Hy M.D.   On: 08/31/2014 19:12   No results found for this or any previous visit (from the past 240 hour(s)).    09/01/2014, 11:32 AM     LOS: 1 day

## 2014-09-01 NOTE — Consult Note (Signed)
Dictation #: [id number]

## 2014-09-01 NOTE — Consult Note (Signed)
Rodney Walker, Rodney Walker NO.:  [id number]  MEDICAL RECORD NO.:  [id number]  LOCATION:  [id number]                        FACILITY:  [facility]  PHYSICIAN:  Barbaraann Share, MD,FCCPDATE OF BIRTH:  February 17, 1980  DATE OF CONSULTATION:  09/01/2014 DATE OF DISCHARGE:                                CONSULTATION   REFERRING PHYSICIAN:  [facility].  HISTORY OF PRESENT ILLNESS:  The patient is a 33 year old male who I have been asked to see for an abnormal CT of chest.  He has known HIV/AIDS, and presented to the emergency room with a three to four-day history of chest congestion, cough with purulent mucus, but did not have any fevers, chills, or sweats.  He had a chest x-ray that showed a right upper lobe process, and subsequently underwent a CT scan of the chest that showed a 3 cm masslike density in the right upper lobe that was new from a CT in March of 2015.  There was mild nodularity in the left upper lobe, and severe apical bolus disease right greater than left.  The patient does have a history of smoking, but it is very short term.  He has been admitted to the hospital and is on IV antibiotics currently. We have been consulted for further workup.  PAST MEDICAL HISTORY: 1. Significant for dyslipidemia. 2. History of coronary disease with spontaneous dissection of LAD. 3. History of MI. 4. History of HIV infection for which he is on HAART therapy.  His     last CD4 count was in the 200s. 5. History of mild cardiomyopathy with an ejection fraction of 45% at     cath, but then echo showed an EF of 55%-60%.  SOCIAL HISTORY:  He has not smoked in 5 months.  He does smoke marijuana and does not drink alcohol.  FAMILY HISTORY:  Remarkable for diabetes as well as liver cancer.  REVIEW OF SYSTEMS:  A 10-point review of systems was unremarkable except for that listed in history of present illness.  PHYSICAL EXAMINATION:  GENERAL:  He is a thin male in no acute  distress. Blood pressure 101/61, pulse is 60, respiratory rate is 18, he is afebrile, O2 saturation at room air is 98%-100%. HEENT:  Pupils equal, round, and reactive to light accommodation. Extraocular muscles are intact.  Nares are patent.  No discharge. Oropharynx is clear. NECK:  Supple without jugular venous distention or lymphadenopathy. There is no palpable thyromegaly. CHEST:  Totally clear to auscultation.  No wheezing. CARDIAC:  Regular rate and rhythm. ABDOMEN:  Soft, nontender, nondistended with good bowel sounds. GENITALIA/RECTAL/BREAST:  Not done and not indicated. EXTREMITIES:  Lower extremities are without edema.  No cyanosis. NEUROLOGICAL:  He is alert and oriented, and moves all 4 extremities.  IMPRESSION:  Abnormal CT chest with a right upper lobe density of unknown etiology.  This was not present in March of 2015, and therefore it is very unlikely to be related to any type of solid tumor.  The most likely cause for this would be an early lung abscess, tuberculosis, or possibly some type of opportunistic infection related to his HIV status. The other consideration in this patient may be Kaposi sarcoma  or lymphoma.  At this point, we need to focus on getting sputum cultures for bacterial etiologies as well as TB and fungus.  I would not proceed with bronchoscopy unless we have failed to get a diagnosis from sputum evaluation.  Agree with antibiotics and infectious disease consultation in the interim.  SUGGESTIONS: 1. Continue to try and get sputum for the various cultures.  May need     to induce if possible. 2. Continue IV antibiotics and agree with infectious disease     consultation. 3. We will check on the patient again on Tuesday of next week, and if     we do not have an established diagnosis, we would consider     bronchoscopy for further evaluation.     Barbaraann Share, MD,FCCP     [facility]  D:  09/01/2014  T:  09/01/2014  Job:  [id number]

## 2014-09-02 LAB — HIV-1 RNA ULTRAQUANT REFLEX TO GENTYP+
HIV 1 RNA Quant: 50 copies/mL — ABNORMAL HIGH (ref <20)
HIV-1 RNA QUANT, LOG: 1.7 {Log} — AB (ref <1.30)

## 2014-09-02 NOTE — Progress Notes (Signed)
[facility] PROGRESS NOTE  Bud Face Krager EKC:[id number] DOB: 1981-07-03 DOA: 08/31/2014 PCP: No PCP Per Patient  Assessment/Plan: 33 y.o. male with HIV/AIDS who presents to the ED with complaints of productive cough and chest congestion found to have lung mass, with cavitary lesion  1. Lung mass- Rule out TB, lung CA vs KS -CT: 3.8 cm soft tissue mass in the medial right upper lobe with associated right upper lobe clustered nodularity -pend AFB sputum; pulmonology following for broch/biopsy eval; cont isolation r/o TB, atypical inf; check gold TB, ID following   2. HIV cont HAART;   3. Emphysema; check alpha-1 antitrypsin deficiency   Code Status: full Family Communication:  D/w patient; spoken with, relationship, and if by phone, the number) Disposition Plan: home pend clinical imrprovement    Consultants: Pccm, ID Procedures: none Antibiotics:  Cefepime, vanc 9/5<<<< and stop date if known)  HPI/Subjective: alert Objective: Filed Vitals:   09/02/14 0641  BP: 109/59  Pulse: 64  Temp: 98.3 F (36.8 C)  Resp: 16    Intake/Output Summary (Last 24 hours) at 09/02/14 1221 Last data filed at 09/01/14 1500  Gross per 24 hour  Intake    800 ml  Output      0 ml  Net    800 ml   Filed Weights   08/31/14 1324  Weight: 65.772 kg (145 lb)    Exam:   General:  alert  Cardiovascular: s1,s2 rrr  Respiratory: diminished in upper lobes   Abdomen: soft, nt,nd   Musculoskeletal: no LE edmea   Data Reviewed: Basic Metabolic Panel:  Recent Labs Lab 08/31/14 1339 09/01/14 0402  NA 136* 139  K 4.2 3.8  CL 99 103  CO2 27 25  GLUCOSE 77 98  BUN 9 10  CREATININE 1.19 1.19  CALCIUM 9.5 8.7   Liver Function Tests: No results found for this basename: AST, ALT, ALKPHOS, BILITOT, PROT, ALBUMIN,  in the last 168 hours No results found for this basename: LIPASE, AMYLASE,  in the last 168 hours No results found for this basename: AMMONIA,  in the  last 168 hours CBC:  Recent Labs Lab 08/31/14 1339 09/01/14 0402  WBC 6.0 5.0  HGB 12.7* 12.6*  HCT 36.9* 34.8*  MCV 86.0 86.6  PLT 322 258   Cardiac Enzymes: No results found for this basename: CKTOTAL, CKMB, CKMBINDEX, TROPONINI,  in the last 168 hours BNP (last 3 results)  Recent Labs  03/18/14 1130  PROBNP 60.8   CBG: No results found for this basename: GLUCAP,  in the last 168 hours  No results found for this or any previous visit (from the past 240 hour(s)).   Studies: Dg Chest 2 View  08/31/2014   CLINICAL DATA:  Chest pain and cough congestion 2 days history of smoking and COPD  EXAM: CHEST  2 VIEW  COMPARISON:  04/22/2014  FINDINGS: Heart size and vascular pattern are normal. Left lung is clear with resolution of upper lobe infiltrate. On the right, there is bullous change in the upper lobe as seen previously. However, there is now also a rounded 32 x 37 mm suprahilar masslike density. No pleural effusion. Hyperinflation consistent with COPD, stable.  IMPRESSION: Recommend contrast-enhanced CT thorax to further evaluate rounded masslike density right upper lobe, suspicious for malignancy.   Electronically Signed   By: Esperanza Heir M.D.   On: 08/31/2014 17:33   Ct Chest W Contrast  08/31/2014   CLINICAL DATA:  Cough, abnormal chest radiograph, HIV.  EXAM: CT CHEST WITH CONTRAST  TECHNIQUE: Multidetector CT imaging of the chest was performed during intravenous contrast administration.  CONTRAST:  OMNIPAQUE IOHEXOL 300 MG/ML  SOLN  COMPARISON:  Chest radiograph dated 08/31/2014. CTA chest dated 03/18/2014.  FINDINGS: 3.8 x 2.7 x 2.8 cm soft tissue mass in the medial right upper lobe (series 3/ image 21), accounting for the radiographic abnormality, new from prior CT. Lesion appears low-density/ necrotic on soft tissue windows (series 5/ image 45). Associated clustered satellite nodularity in the right upper lobe (series 3/ images 19-22).  Additional minimal clustered  nodularity in the posterior left upper lobe (series 3/image 20) with an additional 3-4 mm nodule in the lateral left upper lobe (series 3/image 14). Underlying moderate paraseptal emphysematous changes with bullous changes at the right lung apex.  Overall, this appearance is most suggestive of infection, possibly atypical infection such as TB. HIV related malignancies including Kaposi's sarcoma and lymphoma remain possible. Given the patient's age, as well as the lack of any corresponding abnormality on prior CT, primary bronchogenic neoplasm is considered unlikely, although the patient does have underlying emphysematous changes.  Visualized thyroid is unremarkable.  The heart is normal in size.  No pericardial effusion.  Thoracic lymphadenopathy, including:  --13 mm short axis high right paratracheal node (series 2/ image 15)  --11 mm short axis necrotic right paratracheal node (series 2/image 20)  --10 mm short axis right hilar node (series 2/image 28)  Visualized upper abdomen is unremarkable.  Visualized osseous structures are within normal limits.  IMPRESSION: 3.8 cm soft tissue mass in the medial right upper lobe with associated right upper lobe clustered nodularity.  This appearance is most suggestive of infection, possibly atypical infection such as TB. HIV related malignancies including Kaposi's sarcoma and lymphoma remain possible. Primary bronchogenic neoplasm is considered unlikely.  Consider sputum culture and/or bronchoscopy as clinically warranted. At a minimum, follow-up imaging is suggested to document resolution following appropriate antimicrobial therapy.   Electronically Signed   By: Charline Bills M.D.   On: 08/31/2014 19:12    Scheduled Meds: . ceFEPime (MAXIPIME) IV  1 g Intravenous Q12H  . dolutegravir  50 mg Oral Daily  . emtricitabine-tenofovir  1 tablet Oral Daily  . vancomycin  750 mg Intravenous Q12H   Continuous Infusions: . sodium chloride 100 mL/hr at 09/01/14 1138     Principal Problem:   Lung mass Active Problems:   CAD (coronary artery disease)   HIV disease    Time spent: >35 minutes     Esperanza Sheets  [facility] Pager [phone]. If 7PM-7AM, please contact night-coverage at [email or web address], password [facility] 09/02/2014, 12:21 PM  LOS: 2 days

## 2014-09-03 LAB — VANCOMYCIN, TROUGH: Vancomycin Tr: 9.5 ug/mL — ABNORMAL LOW (ref 10.0–20.0)

## 2014-09-03 MED ORDER — VANCOMYCIN HCL 10 G IV SOLR
1250.0000 mg | Freq: Two times a day (BID) | INTRAVENOUS | Status: DC
  Administered 2014-09-03 – 2014-09-05 (×4): 1250 mg via INTRAVENOUS
  Filled 2014-09-03 (×5): qty 1250

## 2014-09-03 NOTE — Progress Notes (Signed)
ANTIBIOTIC CONSULT NOTE - FOLLOW UP  Pharmacy Consult: Vancomycin Indication: HCAP  Dosing Weight: 66 kg  Afebrile  98.4 F (36.9 C) (Oral)   Lab Results  Component Value Date   WBC 5.0 09/01/2014    Labs:  Recent Labs  09/01/14 0402  WBC 5.0  HGB 12.6*  PLT 258  CREATININE 1.19    Estimated Creatinine Clearance: 73.9 ml/min (by C-G formula based on Cr of 1.19).   Recent Labs  09/03/14 2019  VANCOTROUGH 9.5*     Microbiology: Recent Results (from the past 720 hour(s))  CULTURE, BLOOD (ROUTINE X 2)     Status: None   Collection Time    08/31/14  9:36 PM      Result Value Ref Range Status   Specimen Description BLOOD LEFT ARM   Final   Special Requests BOTTLES DRAWN AEROBIC AND ANAEROBIC B 5CC R 4CC   Final   Culture  Setup Time     Final   Value: 09/01/2014 10:18     Performed at [facility]   Culture     Final   Value:        BLOOD CULTURE RECEIVED NO GROWTH TO DATE CULTURE WILL BE HELD FOR 5 DAYS BEFORE ISSUING A FINAL NEGATIVE REPORT     Performed at [facility]   Report Status PENDING   Incomplete  CULTURE, BLOOD (ROUTINE X 2)     Status: None   Collection Time    08/31/14  9:42 PM      Result Value Ref Range Status   Specimen Description BLOOD LEFT FOREARM   Final   Special Requests BOTTLES DRAWN AEROBIC AND ANAEROBIC 5CC   Final   Culture  Setup Time     Final   Value: 09/01/2014 10:16     Performed at [facility]   Culture     Final   Value:        BLOOD CULTURE RECEIVED NO GROWTH TO DATE CULTURE WILL BE HELD FOR 5 DAYS BEFORE ISSUING A FINAL NEGATIVE REPORT     Performed at [facility]   Report Status PENDING   Incomplete  AFB CULTURE WITH SMEAR     Status: None   Collection Time    09/02/14 10:11 AM      Result Value Ref Range Status   Specimen Description SPUTUM   Final   Special Requests Immunocompromised   Final   Acid Fast Smear     Final   Value: NO ACID FAST BACILLI SEEN     Performed at [facility]   Culture     Final   Value: CULTURE WILL BE EXAMINED FOR 6 WEEKS BEFORE ISSUING A FINAL REPORT     Performed at [facility]   Report Status PENDING   Incomplete    Current Medication[s] Include:  Scheduled:  Scheduled:  . ceFEPime (MAXIPIME) IV  1 g Intravenous Q12H  . dolutegravir  50 mg Oral Daily  . emtricitabine-tenofovir  1 tablet Oral Daily  . vancomycin  750 mg Intravenous Q12H    Infusion[s]: Infusions:    Antibiotic[s]: Anti-infectives   Start     Dose/Rate Route Frequency Ordered Stop   09/01/14 1000  dolutegravir (TIVICAY) tablet 50 mg     50 mg Oral Daily 08/31/14 2236     09/01/14 1000  sulfamethoxazole-trimethoprim (BACTRIM DS) 800-160 MG per tablet 1 tablet  Status:  Discontinued     1 tablet  Oral Daily 08/31/14 2236 09/01/14 1156   09/01/14 1000  emtricitabine-tenofovir (TRUVADA) 200-300 MG per tablet 1 tablet     1 tablet Oral Daily 08/31/14 2236     08/31/14 2200  ceFEPIme (MAXIPIME) 1 g in dextrose 5 % 50 mL IVPB     1 g 100 mL/hr over 30 Minutes Intravenous Every 12 hours 08/31/14 2145     08/31/14 2200  vancomycin (VANCOCIN) IVPB 750 mg/150 ml premix     750 mg 150 mL/hr over 60 Minutes Intravenous Every 12 hours 08/31/14 2145        Assessment:  33 y/o male with HIV/AIDS on Vancomycin and Cefepime for presumed HCAP.  Patient is afebrile and WBC's reported as 5.  Vancomycin trough [drawn ~ 1 hour early] is 9.5 mcg/ml.  Vancomycin level below goal for PNA.  Goal of Therapy:   Vancomycin trough level 15-20 mcg/ml  Plan:  1. Increase Vancomycin to 1250 mg IV q 12 hours. 2. Measure antibiotic drug levels at steady state 3. Monitor renal function, WBC, fever curve, any cultures/sensitivities, and clinical progression.  Jackelyne Sayer, Elisha Headland,  Pharm.D.   09/03/2014,9:43 PM

## 2014-09-03 NOTE — Progress Notes (Signed)
[facility] PROGRESS NOTE  Rodney Walker MHW:[id number] DOB: Oct 12, 1981 DOA: 08/31/2014 PCP: No PCP Per Patient  Assessment/Plan: 33 y.o. male with HIV/AIDS who presents to the ED with complaints of productive cough and chest congestion found to have lung mass, with cavitary lesion  1. Lung mass- Rule out TB, lung CA vs KS -CT: 3.8 cm soft tissue mass in the medial right upper lobe with associated right upper lobe clustered nodularity -pend AFB sputum; pulmonology following for broch/biopsy eval; cont isolation r/o TB, atypical inf; check gold TB, ID following   2. HIV cont HAART;  HIV test pend  3. Emphysema; check alpha-1 antitrypsin deficiency-pend    Code Status: full Family Communication:  D/w patient; spoken with, relationship, and if by phone, the number) Disposition Plan: home pend clinical imrprovement    Consultants: Pccm, ID Procedures: none Antibiotics:  Cefepime, vanc 9/5<<<< and stop date if known)  HPI/Subjective: alert Objective: Filed Vitals:   09/03/14 0650  BP: 94/61  Pulse: 75  Temp: 94.8 F (34.9 C)  Resp: 17   No intake or output data in the 24 hours ending 09/03/14 1029 Filed Weights   08/31/14 1324  Weight: 65.772 kg (145 lb)    Exam:   General:  alert  Cardiovascular: s1,s2 rrr  Respiratory: diminished in upper lobes   Abdomen: soft, nt,nd   Musculoskeletal: no LE edmea   Data Reviewed: Basic Metabolic Panel:  Recent Labs Lab 08/31/14 1339 09/01/14 0402  NA 136* 139  K 4.2 3.8  CL 99 103  CO2 27 25  GLUCOSE 77 98  BUN 9 10  CREATININE 1.19 1.19  CALCIUM 9.5 8.7   Liver Function Tests: No results found for this basename: AST, ALT, ALKPHOS, BILITOT, PROT, ALBUMIN,  in the last 168 hours No results found for this basename: LIPASE, AMYLASE,  in the last 168 hours No results found for this basename: AMMONIA,  in the last 168 hours CBC:  Recent Labs Lab 08/31/14 1339 09/01/14 0402  WBC 6.0 5.0   HGB 12.7* 12.6*  HCT 36.9* 34.8*  MCV 86.0 86.6  PLT 322 258   Cardiac Enzymes: No results found for this basename: CKTOTAL, CKMB, CKMBINDEX, TROPONINI,  in the last 168 hours BNP (last 3 results)  Recent Labs  03/18/14 1130  PROBNP 60.8   CBG: No results found for this basename: GLUCAP,  in the last 168 hours  Recent Results (from the past 240 hour(s))  CULTURE, BLOOD (ROUTINE X 2)     Status: None   Collection Time    08/31/14  9:36 PM      Result Value Ref Range Status   Specimen Description BLOOD LEFT ARM   Final   Special Requests BOTTLES DRAWN AEROBIC AND ANAEROBIC B 5CC R 4CC   Final   Culture  Setup Time     Final   Value: 09/01/2014 10:18     Performed at [facility]   Culture     Final   Value:        BLOOD CULTURE RECEIVED NO GROWTH TO DATE CULTURE WILL BE HELD FOR 5 DAYS BEFORE ISSUING A FINAL NEGATIVE REPORT     Performed at [facility]   Report Status PENDING   Incomplete  CULTURE, BLOOD (ROUTINE X 2)     Status: None   Collection Time    08/31/14  9:42 PM      Result Value Ref Range Status   Specimen Description BLOOD LEFT FOREARM  Final   Special Requests BOTTLES DRAWN AEROBIC AND ANAEROBIC 5CC   Final   Culture  Setup Time     Final   Value: 09/01/2014 10:16     Performed at [facility]   Culture     Final   Value:        BLOOD CULTURE RECEIVED NO GROWTH TO DATE CULTURE WILL BE HELD FOR 5 DAYS BEFORE ISSUING A FINAL NEGATIVE REPORT     Performed at [facility]   Report Status PENDING   Incomplete     Studies: No results found.  Scheduled Meds: . ceFEPime (MAXIPIME) IV  1 g Intravenous Q12H  . dolutegravir  50 mg Oral Daily  . emtricitabine-tenofovir  1 tablet Oral Daily  . vancomycin  750 mg Intravenous Q12H   Continuous Infusions:    Principal Problem:   Lung mass Active Problems:   CAD (coronary artery disease)   HIV disease    Time spent: >35 minutes     Esperanza Sheets  [facility] Pager [phone]. If 7PM-7AM, please contact night-coverage at [email or web address], password [facility] 09/03/2014, 10:29 AM  LOS: 3 days

## 2014-09-04 ENCOUNTER — Other Ambulatory Visit: Payer: Self-pay | Admitting: *Deleted

## 2014-09-04 DIAGNOSIS — B20 Human immunodeficiency virus [HIV] disease: Secondary | ICD-10-CM

## 2014-09-04 DIAGNOSIS — J189 Pneumonia, unspecified organism: Secondary | ICD-10-CM

## 2014-09-04 LAB — QUANTIFERON TB GOLD ASSAY (BLOOD)
INTERFERON GAMMA RELEASE ASSAY: NEGATIVE
MITOGEN VALUE: 1.6 [IU]/mL
QUANTIFERON NIL VALUE: 0.06 [IU]/mL
TB AG VALUE: 0.07 [IU]/mL
TB Antigen Minus Nil Value: 0.01 IU/mL

## 2014-09-04 LAB — T-HELPER CELLS (CD4) COUNT (NOT AT ARMC)
CD4 % Helper T Cell: 29 % — ABNORMAL LOW (ref 33–55)
CD4 T Cell Abs: 300 /uL — ABNORMAL LOW (ref 400–2700)

## 2014-09-04 LAB — PROTIME-INR
INR: 1.11 (ref 0.00–1.49)
Prothrombin Time: 14.3 seconds (ref 11.6–15.2)

## 2014-09-04 MED ORDER — DOLUTEGRAVIR SODIUM 50 MG PO TABS: 50.0000 mg | ORAL_TABLET | Freq: Every day | ORAL | Status: DC

## 2014-09-04 MED ORDER — EMTRICITABINE-TENOFOVIR DF 200-300 MG PO TABS: 1.0000 | ORAL_TABLET | Freq: Every day | ORAL | Status: DC

## 2014-09-04 NOTE — Progress Notes (Signed)
   Name: Rodney Walker MRN: [id number] DOB: 11-03-1981    ADMISSION DATE:  08/31/2014 CONSULTATION DATE:  09/01/2014  PRIMARY SERVICE:  [facility]  CHIEF COMPLAINT:  Productive cough/Abnormal chest CT  BRIEF PATIENT DESCRIPTION: The patient is a 33 year old male who I have been asked to see for an abnormal CT of chest. He has known HIV/AIDS, and presented to the emergency room with a three to four-Rodney history of chest congestion, cough with purulent mucus, but did not have any fevers, chills, or sweats. He had a chest x-ray that showed a right upper lobe process, and subsequently underwent a CT scan of the chest that showed a 3 cm masslike density in the right upper lobe that was new from a CT in March of 2015. There was mild nodularity in the left upper lobe, and severe apical bolus disease right greater than left. The patient does have a history of smoking, but it is very short term. He has been admitted to the hospital and is on IV antibiotics currently. We have been consulted for further workup.  SIGNIFICANT EVENTS / STUDIES:  CT chest 9/4 - 3.8 cm soft tissue mass in the medial right upper lobe with associated right upper lobe clustered nodularity.  LINES / TUBES: PIV  CULTURES: Blood 9/4 >>> AFB Sputum 9/6 >>>  ANTIBIOTICS: Cefepime 9/4 >>> Vanc 9/4 >>> ART with dolutegravir, emtricitabine-renofovir  SUBJECTIVE:  States his symptoms have improved significantly. Cough is mostly gone and is no longer productive. Feels healthy enough to "run 2 miles".  VITAL SIGNS: Temp:  [98.2 F (36.8 C)-98.5 F (36.9 C)] 98.2 F (36.8 C) (09/08 0555) Pulse Rate:  [56-63] 63 (09/08 0555) Resp:  [14-17] 17 (09/08 0555) BP: (106-118)/(59-68) 118/68 mmHg (09/08 0555) SpO2:  [98 %-100 %] 100 % (09/08 0555)  PHYSICAL EXAMINATION: General:  Thin male, in NAD Neuro:  Alert, oriented, no focal decifit HEENT:  [place]/AT, PERRL, no JVD noted Cardiovascular:  RRR, no MRG Lungs:  Clear posterior  chest, no distress Abdomen:  Soft, non-tender, non-distneded Musculoskeletal:  No acute deformity or ROM limitation Skin:  Intact, MMM   Recent Labs Lab 08/31/14 1339 09/01/14 0402  NA 136* 139  K 4.2 3.8  CL 99 103  CO2 27 25  BUN 9 10  CREATININE 1.19 1.19  GLUCOSE 77 98    Recent Labs Lab 08/31/14 1339 09/01/14 0402  HGB 12.7* 12.6*  HCT 36.9* 34.8*  WBC 6.0 5.0  PLT 322 258   AFB smear negative  IMAGING No results found.  ASSESSMENT / PLAN:  Abnormal density of RUL on chest CT. Oppurtunistic infection vs TB vs abscess  - Send sputum cultures - May need require bronchoscopy if unable to send.  - Antibiotics/ ART per ID - F/u Quantiferon gold, AFB culture, A1A.  Joneen Roach, ACNP [facility] Pager [phone] or [phone]    Attending:  I have seen and examined the patient with nurse practitioner/resident and agree with the note above.   Needs a bronch Keep NPO Plan for broncho 9/9 PM  Heber Dayton, MD [place] [facility] Pager: [phone] Cell: [phone] If no response, call [phone]

## 2014-09-04 NOTE — Progress Notes (Signed)
[facility] PROGRESS NOTE  Rodney Walker:[id number] DOB: October 26, 1981 DOA: 08/31/2014 PCP: No PCP Per Patient  Assessment/Plan: 33 y.o. male with HIV/AIDS who presents to the ED with complaints of productive cough and chest congestion found to have lung mass, with cavitary lesion  1. Lung mass- Rule out TB, lung CA vs [place] -CT: 3.8 cm soft tissue mass in the medial right upper lobe with associated right upper lobe clustered nodularity -prelim no AFB sputum; pend final decision broch/biopsy eval per pulmonology;  -cont isolation r/o TB, atypical inf; check gold TB-pend, ID following   2. HIV cont HAART;  HIV test pend  3. Emphysema; check alpha-1 antitrypsin deficiency-pend    Code Status: full Family Communication:  D/w patient; spoken with, relationship, and if by phone, the number) Disposition Plan: home pend clinical improvement; ? Bronch pend pulmonary decision    Consultants: Pccm, ID Procedures: none Antibiotics:  Cefepime, vanc 9/5<<<< and stop date if known)  HPI/Subjective: alert Objective: Filed Vitals:   09/04/14 0555  BP: 118/68  Pulse: 63  Temp: 98.2 F (36.8 C)  Resp: 17    Intake/Output Summary (Last 24 hours) at 09/04/14 1019 Last data filed at 09/04/14 0851  Gross per 24 hour  Intake   1230 ml  Output     50 ml  Net   1180 ml   Filed Weights   08/31/14 1324  Weight: 65.772 kg (145 lb)    Exam:   General:  alert  Cardiovascular: s1,s2 rrr  Respiratory: diminished in upper lobes   Abdomen: soft, nt,nd   Musculoskeletal: no LE edmea   Data Reviewed: Basic Metabolic Panel:  Recent Labs Lab 08/31/14 1339 09/01/14 0402  NA 136* 139  K 4.2 3.8  CL 99 103  CO2 27 25  GLUCOSE 77 98  BUN 9 10  CREATININE 1.19 1.19  CALCIUM 9.5 8.7   Liver Function Tests: No results found for this basename: AST, ALT, ALKPHOS, BILITOT, PROT, ALBUMIN,  in the last 168 hours No results found for this basename: LIPASE, AMYLASE,  in  the last 168 hours No results found for this basename: AMMONIA,  in the last 168 hours CBC:  Recent Labs Lab 08/31/14 1339 09/01/14 0402  WBC 6.0 5.0  HGB 12.7* 12.6*  HCT 36.9* 34.8*  MCV 86.0 86.6  PLT 322 258   Cardiac Enzymes: No results found for this basename: CKTOTAL, CKMB, CKMBINDEX, TROPONINI,  in the last 168 hours BNP (last 3 results)  Recent Labs  03/18/14 1130  PROBNP 60.8   CBG: No results found for this basename: GLUCAP,  in the last 168 hours  Recent Results (from the past 240 hour(s))  CULTURE, BLOOD (ROUTINE X 2)     Status: None   Collection Time    08/31/14  9:36 PM      Result Value Ref Range Status   Specimen Description BLOOD LEFT ARM   Final   Special Requests BOTTLES DRAWN AEROBIC AND ANAEROBIC B 5CC R 4CC   Final   Culture  Setup Time     Final   Value: 09/01/2014 10:18     Performed at [facility]   Culture     Final   Value:        BLOOD CULTURE RECEIVED NO GROWTH TO DATE CULTURE WILL BE HELD FOR 5 DAYS BEFORE ISSUING A FINAL NEGATIVE REPORT     Performed at [facility]   Report Status PENDING   Incomplete  CULTURE, BLOOD (ROUTINE X 2)     Status: None   Collection Time    08/31/14  9:42 PM      Result Value Ref Range Status   Specimen Description BLOOD LEFT FOREARM   Final   Special Requests BOTTLES DRAWN AEROBIC AND ANAEROBIC 5CC   Final   Culture  Setup Time     Final   Value: 09/01/2014 10:16     Performed at [facility]   Culture     Final   Value:        BLOOD CULTURE RECEIVED NO GROWTH TO DATE CULTURE WILL BE HELD FOR 5 DAYS BEFORE ISSUING A FINAL NEGATIVE REPORT     Performed at [facility]   Report Status PENDING   Incomplete  AFB CULTURE WITH SMEAR     Status: None   Collection Time    09/02/14 10:11 AM      Result Value Ref Range Status   Specimen Description SPUTUM   Final   Special Requests Immunocompromised   Final   Acid Fast Smear     Final   Value: NO ACID FAST BACILLI SEEN      Performed at [facility]   Culture     Final   Value: CULTURE WILL BE EXAMINED FOR 6 WEEKS BEFORE ISSUING A FINAL REPORT     Performed at [facility]   Report Status PENDING   Incomplete     Studies: No results found.  Scheduled Meds: . ceFEPime (MAXIPIME) IV  1 g Intravenous Q12H  . dolutegravir  50 mg Oral Daily  . emtricitabine-tenofovir  1 tablet Oral Daily  . vancomycin (VANCOCIN) 1250 mg IVPB  1,250 mg Intravenous Q12H   Continuous Infusions:    Principal Problem:   Lung mass Active Problems:   CAD (coronary artery disease)   HIV disease    Time spent: >35 minutes     Esperanza Sheets  [facility] Pager [phone]. If 7PM-7AM, please contact night-coverage at [email or web address], password [facility] 09/04/2014, 10:19 AM  LOS: 4 days

## 2014-09-04 NOTE — Progress Notes (Signed)
INFECTIOUS DISEASE PROGRESS NOTE  ID: Rodney Walker is a 33 y.o. male with  Principal Problem:   Lung mass Active Problems:   CAD (coronary artery disease)   HIV disease  Subjective: Without complaint. Wants to home.  Rare cough, no sputum  Abtx:  Anti-infectives   Start     Dose/Rate Route Frequency Ordered Stop   09/03/14 2200  vancomycin (VANCOCIN) 1,250 mg in sodium chloride 0.9 % 250 mL IVPB     1,250 mg 166.7 mL/hr over 90 Minutes Intravenous Every 12 hours 09/03/14 2154     09/01/14 1000  dolutegravir (TIVICAY) tablet 50 mg     50 mg Oral Daily 08/31/14 2236     09/01/14 1000  sulfamethoxazole-trimethoprim (BACTRIM DS) 800-160 MG per tablet 1 tablet  Status:  Discontinued     1 tablet Oral Daily 08/31/14 2236 09/01/14 1156   09/01/14 1000  emtricitabine-tenofovir (TRUVADA) 200-300 MG per tablet 1 tablet     1 tablet Oral Daily 08/31/14 2236     08/31/14 2200  ceFEPIme (MAXIPIME) 1 g in dextrose 5 % 50 mL IVPB     1 g 100 mL/hr over 30 Minutes Intravenous Every 12 hours 08/31/14 2145     08/31/14 2200  vancomycin (VANCOCIN) IVPB 750 mg/150 ml premix  Status:  Discontinued     750 mg 150 mL/hr over 60 Minutes Intravenous Every 12 hours 08/31/14 2145 09/03/14 2155      Medications:  Scheduled: . ceFEPime (MAXIPIME) IV  1 g Intravenous Q12H  . dolutegravir  50 mg Oral Daily  . emtricitabine-tenofovir  1 tablet Oral Daily  . vancomycin (VANCOCIN) 1250 mg IVPB  1,250 mg Intravenous Q12H    Objective: Vital signs in last 24 hours: Temp:  [98.1 F (36.7 C)-98.4 F (36.9 C)] 98.1 F (36.7 C) (09/08 1355) Pulse Rate:  [56-63] 60 (09/08 1355) Resp:  [14-17] 16 (09/08 1355) BP: (106-118)/(59-68) 114/62 mmHg (09/08 1355) SpO2:  [100 %] 100 % (09/08 1355)   General appearance: alert, cooperative and no distress Resp: clear to auscultation bilaterally Cardio: regular rate and rhythm GI: normal findings: bowel sounds normal and soft, non-tender  Lab  Results No results found for this basename: WBC, HGB, HCT, PLATELETS, NA, K, CL, CO2, BUN, CREATININE, GLU,  in the last 72 hours Liver Panel No results found for this basename: PROT, ALBUMIN, AST, ALT, ALKPHOS, BILITOT, BILIDIR, IBILI,  in the last 72 hours Sedimentation Rate No results found for this basename: ESRSEDRATE,  in the last 72 hours C-Reactive Protein No results found for this basename: CRP,  in the last 72 hours  Microbiology: Recent Results (from the past 240 hour(s))  CULTURE, BLOOD (ROUTINE X 2)     Status: None   Collection Time    08/31/14  9:36 PM      Result Value Ref Range Status   Specimen Description BLOOD LEFT ARM   Final   Special Requests BOTTLES DRAWN AEROBIC AND ANAEROBIC B 5CC R 4CC   Final   Culture  Setup Time     Final   Value: 09/01/2014 10:18     Performed at [facility]   Culture     Final   Value:        BLOOD CULTURE RECEIVED NO GROWTH TO DATE CULTURE WILL BE HELD FOR 5 DAYS BEFORE ISSUING A FINAL NEGATIVE REPORT     Performed at [facility]   Report Status PENDING   Incomplete  CULTURE,  BLOOD (ROUTINE X 2)     Status: None   Collection Time    08/31/14  9:42 PM      Result Value Ref Range Status   Specimen Description BLOOD LEFT FOREARM   Final   Special Requests BOTTLES DRAWN AEROBIC AND ANAEROBIC 5CC   Final   Culture  Setup Time     Final   Value: 09/01/2014 10:16     Performed at [facility]   Culture     Final   Value:        BLOOD CULTURE RECEIVED NO GROWTH TO DATE CULTURE WILL BE HELD FOR 5 DAYS BEFORE ISSUING A FINAL NEGATIVE REPORT     Performed at [facility]   Report Status PENDING   Incomplete  AFB CULTURE WITH SMEAR     Status: None   Collection Time    09/02/14 10:11 AM      Result Value Ref Range Status   Specimen Description SPUTUM   Final   Special Requests Immunocompromised   Final   Acid Fast Smear     Final   Value: NO ACID FAST BACILLI SEEN     Performed at [facility]    Culture     Final   Value: CULTURE WILL BE EXAMINED FOR 6 WEEKS BEFORE ISSUING A FINAL REPORT     Performed at [facility]   Report Status PENDING   Incomplete    Studies/Results: No results found.   Assessment/Plan: Lung Mass  AIDS  Prev PCP  Previous indeterminant quantiferon gold, now negative  Sputum AFB (-)  Total days of antibiotics: 6 cefepime, vanco  Need second AFB smear.  Consider bronch         [name] Infectious Diseases (pager) [phone] [email or web address] 09/04/2014, 3:38 PM  LOS: 4 days   HIV 1 RNA Quant (copies/mL)  Date Value  09/01/2014 50*  07/09/2014 26*  04/19/2014 118*     CD4 T Cell Abs (/uL)  Date Value  08/31/2014 300*  07/09/2014 260*  04/19/2014 120*

## 2014-09-04 NOTE — Telephone Encounter (Signed)
ADAP Application 

## 2014-09-05 ENCOUNTER — Inpatient Hospital Stay (HOSPITAL_COMMUNITY): Payer: No Typology Code available for payment source

## 2014-09-05 ENCOUNTER — Inpatient Hospital Stay (HOSPITAL_COMMUNITY): Payer: Self-pay

## 2014-09-05 ENCOUNTER — Encounter (HOSPITAL_COMMUNITY)
Admission: EM | Discharge status: Against Medical Advice | Payer: Self-pay | Source: Home / Self Care | Attending: Internal Medicine

## 2014-09-05 DIAGNOSIS — L738 Other specified follicular disorders: Secondary | ICD-10-CM

## 2014-09-05 DIAGNOSIS — E871 Hypo-osmolality and hyponatremia: Secondary | ICD-10-CM

## 2014-09-05 DIAGNOSIS — R222 Localized swelling, mass and lump, trunk: Secondary | ICD-10-CM

## 2014-09-05 HISTORY — PX: VIDEO BRONCHOSCOPY: SHX5072

## 2014-09-05 SURGERY — BRONCHOSCOPY, WITH FLUOROSCOPY
Anesthesia: Moderate Sedation | Laterality: Bilateral

## 2014-09-05 MED ORDER — LIDOCAINE HCL 2 % EX GEL
1.0000 "application " | Freq: Once | CUTANEOUS | Status: DC
  Filled 2014-09-05: qty 5

## 2014-09-05 MED ORDER — FENTANYL CITRATE 0.05 MG/ML IJ SOLN
INTRAMUSCULAR | Status: DC | PRN
  Administered 2014-09-05: 25 ug via INTRAVENOUS
  Administered 2014-09-05: 50 ug via INTRAVENOUS
  Administered 2014-09-05: 25 ug via INTRAVENOUS

## 2014-09-05 MED ORDER — MIDAZOLAM HCL 5 MG/ML IJ SOLN
INTRAMUSCULAR | Status: DC | PRN
  Administered 2014-09-05: 2 mg via INTRAVENOUS
  Administered 2014-09-05: 4 mg via INTRAVENOUS

## 2014-09-05 MED ORDER — LIDOCAINE HCL (PF) 1 % IJ SOLN
INTRAMUSCULAR | Status: DC | PRN
  Administered 2014-09-05: 6 mL

## 2014-09-05 MED ORDER — FENTANYL CITRATE 0.05 MG/ML IJ SOLN
INTRAMUSCULAR | Status: AC
  Filled 2014-09-05: qty 4

## 2014-09-05 MED ORDER — FENTANYL CITRATE 0.05 MG/ML IJ SOLN: 25.0000 ug | Freq: Once | INTRAMUSCULAR | Status: DC

## 2014-09-05 MED ORDER — MIDAZOLAM HCL 2 MG/2ML IJ SOLN: 2.0000 mg | INTRAMUSCULAR | Status: DC | PRN

## 2014-09-05 MED ORDER — LIDOCAINE HCL 2 % EX GEL
CUTANEOUS | Status: DC | PRN
  Administered 2014-09-05: 1

## 2014-09-05 MED ORDER — PHENYLEPHRINE HCL 0.25 % NA SOLN
1.0000 | Freq: Four times a day (QID) | NASAL | Status: DC | PRN
  Filled 2014-09-05: qty 15

## 2014-09-05 MED ORDER — SODIUM CHLORIDE 0.9 % IV SOLN
INTRAVENOUS | Status: DC
  Administered 2014-09-05: 13:00:00 via INTRAVENOUS

## 2014-09-05 MED ORDER — MIDAZOLAM HCL 5 MG/ML IJ SOLN
INTRAMUSCULAR | Status: AC
  Filled 2014-09-05: qty 2

## 2014-09-05 MED ORDER — PHENYLEPHRINE HCL 0.25 % NA SOLN
NASAL | Status: DC | PRN
  Administered 2014-09-05: 2 via NASAL

## 2014-09-05 NOTE — Plan of Care (Signed)
Problem: Phase I Progression Outcomes Goal: Other Phase I Outcomes/Goals Patient educated to be put on airborne precautions due to possible TB, family educated about the precaution needed.

## 2014-09-05 NOTE — Plan of Care (Signed)
Problem: Phase II Progression Outcomes Goal: Other Phase II Outcomes/Goals Patient on airborne precautions for possible TB

## 2014-09-05 NOTE — Progress Notes (Signed)
Patient ID: Rodney Walker  male  GDJ:[id number]    DOB: Dec 06, 1981    DOA: 08/31/2014  PCP: No PCP Per Patient  33 y.o. male with HIV/AIDS who presents to the ED with complaints of productive cough and chest congestion found to have lung mass, with cavitary lesion   Assessment/Plan: Principal Problem:   Lung mass: Rule out TB versus lung cancer vs abscess - CT of the chest showed 3.8 cm soft tissue mass in the medial right upper lobe with associated right upper lobe clustered nodularity - cont isolation r/o TB, atypical inf - gold TB interferon negative ID following  - Pulmonology consulted for possible bronchoscopy today - AFB x1 neg - Continue IV vancomycin and cefepime  Active Problems:   CAD (coronary artery disease) - Stable    HIV disease -ID following on HAART    DVT Prophylaxis:  Code Status:  Family Communication:  Disposition:  Consultants:  Infectious disease    pulmonology  Procedures:  Bronchoscopy today   Antibiotics:  IV vancomycin  IV cefepime   Subjective: Patient seen and examined, wants bronchoscopy to be done soon, no acute complaints   Objective: Weight change:   Intake/Output Summary (Last 24 hours) at 09/05/14 1145 Last data filed at 09/05/14 0927  Gross per 24 hour  Intake    240 ml  Output      0 ml  Net    240 ml   Blood pressure 106/58, pulse 56, temperature 98 F (36.7 C), temperature source Oral, resp. rate 18, height 5\' 4"  (1.626 m), weight 65.772 kg (145 lb), SpO2 99.00%.  Physical Exam: General: Alert and awake, oriented x3, not in any acute distress. CVS: S1-S2 clear, no murmur rubs or gallops Chest: CTA B Abdomen: soft nontender, nondistended, normal bowel sounds  Extremities: no cyanosis, clubbing or edema noted bilaterally Neuro: Cranial nerves II-XII intact, no focal neurological deficits  Lab Results: Basic Metabolic Panel:  Recent Labs Lab 08/31/14 1339 09/01/14 0402  NA 136* 139  K 4.2  3.8  CL 99 103  CO2 27 25  GLUCOSE 77 98  BUN 9 10  CREATININE 1.19 1.19  CALCIUM 9.5 8.7   Liver Function Tests: No results found for this basename: AST, ALT, ALKPHOS, BILITOT, PROT, ALBUMIN,  in the last 168 hours No results found for this basename: LIPASE, AMYLASE,  in the last 168 hours No results found for this basename: AMMONIA,  in the last 168 hours CBC:  Recent Labs Lab 08/31/14 1339 09/01/14 0402  WBC 6.0 5.0  HGB 12.7* 12.6*  HCT 36.9* 34.8*  MCV 86.0 86.6  PLT 322 258   Cardiac Enzymes: No results found for this basename: CKTOTAL, CKMB, CKMBINDEX, TROPONINI,  in the last 168 hours BNP: No components found with this basename: POCBNP,  CBG: No results found for this basename: GLUCAP,  in the last 168 hours   Micro Results: Recent Results (from the past 240 hour(s))  CULTURE, BLOOD (ROUTINE X 2)     Status: None   Collection Time    08/31/14  9:36 PM      Result Value Ref Range Status   Specimen Description BLOOD LEFT ARM   Final   Special Requests BOTTLES DRAWN AEROBIC AND ANAEROBIC B 5CC R 4CC   Final   Culture  Setup Time     Final   Value: 09/01/2014 10:18     Performed at [facility]   Culture     Final  Value:        BLOOD CULTURE RECEIVED NO GROWTH TO DATE CULTURE WILL BE HELD FOR 5 DAYS BEFORE ISSUING A FINAL NEGATIVE REPORT     Performed at [facility]   Report Status PENDING   Incomplete  CULTURE, BLOOD (ROUTINE X 2)     Status: None   Collection Time    08/31/14  9:42 PM      Result Value Ref Range Status   Specimen Description BLOOD LEFT FOREARM   Final   Special Requests BOTTLES DRAWN AEROBIC AND ANAEROBIC 5CC   Final   Culture  Setup Time     Final   Value: 09/01/2014 10:16     Performed at [facility]   Culture     Final   Value:        BLOOD CULTURE RECEIVED NO GROWTH TO DATE CULTURE WILL BE HELD FOR 5 DAYS BEFORE ISSUING A FINAL NEGATIVE REPORT     Performed at [facility]   Report Status PENDING    Incomplete  AFB CULTURE WITH SMEAR     Status: None   Collection Time    09/02/14 10:11 AM      Result Value Ref Range Status   Specimen Description SPUTUM   Final   Special Requests Immunocompromised   Final   Acid Fast Smear     Final   Value: NO ACID FAST BACILLI SEEN     Performed at [facility]   Culture     Final   Value: CULTURE WILL BE EXAMINED FOR 6 WEEKS BEFORE ISSUING A FINAL REPORT     Performed at [facility]   Report Status PENDING   Incomplete    Studies/Results: Dg Chest 2 View  08/31/2014   CLINICAL DATA:  Chest pain and cough congestion 2 days history of smoking and COPD  EXAM: CHEST  2 VIEW  COMPARISON:  04/22/2014  FINDINGS: Heart size and vascular pattern are normal. Left lung is clear with resolution of upper lobe infiltrate. On the right, there is bullous change in the upper lobe as seen previously. However, there is now also a rounded 32 x 37 mm suprahilar masslike density. No pleural effusion. Hyperinflation consistent with COPD, stable.  IMPRESSION: Recommend contrast-enhanced CT thorax to further evaluate rounded masslike density right upper lobe, suspicious for malignancy.   Electronically Signed   By: Esperanza Heir M.D.   On: 08/31/2014 17:33   Ct Chest W Contrast  08/31/2014   CLINICAL DATA:  Cough, abnormal chest radiograph, HIV.  EXAM: CT CHEST WITH CONTRAST  TECHNIQUE: Multidetector CT imaging of the chest was performed during intravenous contrast administration.  CONTRAST:  OMNIPAQUE IOHEXOL 300 MG/ML  SOLN  COMPARISON:  Chest radiograph dated 08/31/2014. CTA chest dated 03/18/2014.  FINDINGS: 3.8 x 2.7 x 2.8 cm soft tissue mass in the medial right upper lobe (series 3/ image 21), accounting for the radiographic abnormality, new from prior CT. Lesion appears low-density/ necrotic on soft tissue windows (series 5/ image 45). Associated clustered satellite nodularity in the right upper lobe (series 3/ images 19-22).  Additional minimal  clustered nodularity in the posterior left upper lobe (series 3/image 20) with an additional 3-4 mm nodule in the lateral left upper lobe (series 3/image 14). Underlying moderate paraseptal emphysematous changes with bullous changes at the right lung apex.  Overall, this appearance is most suggestive of infection, possibly atypical infection such as TB. HIV related malignancies including Kaposi's sarcoma  and lymphoma remain possible. Given the patient's age, as well as the lack of any corresponding abnormality on prior CT, primary bronchogenic neoplasm is considered unlikely, although the patient does have underlying emphysematous changes.  Visualized thyroid is unremarkable.  The heart is normal in size.  No pericardial effusion.  Thoracic lymphadenopathy, including:  --13 mm short axis high right paratracheal node (series 2/ image 15)  --11 mm short axis necrotic right paratracheal node (series 2/image 20)  --10 mm short axis right hilar node (series 2/image 28)  Visualized upper abdomen is unremarkable.  Visualized osseous structures are within normal limits.  IMPRESSION: 3.8 cm soft tissue mass in the medial right upper lobe with associated right upper lobe clustered nodularity.  This appearance is most suggestive of infection, possibly atypical infection such as TB. HIV related malignancies including Kaposi's sarcoma and lymphoma remain possible. Primary bronchogenic neoplasm is considered unlikely.  Consider sputum culture and/or bronchoscopy as clinically warranted. At a minimum, follow-up imaging is suggested to document resolution following appropriate antimicrobial therapy.   Electronically Signed   By: Charline Bills M.D.   On: 08/31/2014 19:12    Medications: Scheduled Meds: . ceFEPime (MAXIPIME) IV  1 g Intravenous Q12H  . dolutegravir  50 mg Oral Daily  . emtricitabine-tenofovir  1 tablet Oral Daily  . vancomycin (VANCOCIN) 1250 mg IVPB  1,250 mg Intravenous Q12H      LOS: 5 days    Trenese Haft M.D. [facility] 09/05/2014, 11:45 AM Pager: [phone]  If 7PM-7AM, please contact night-coverage [email or web address] Password [email or web address]  **Disclaimer: This note was dictated with voice recognition software. Similar sounding words can inadvertently be transcribed and this note may contain transcription errors which may not have been corrected upon publication of note.**

## 2014-09-05 NOTE — Op Note (Addendum)
Indication:  RUL mass   Premedication:  midaz 6 mg Fentanyl 75 mcg    Anesthesia: Topical anesthesia to nose and throat 40 cc 1% lidocaine  Procedure: After adequate sedation and anesthesia, the bronchoscope was introduced via the R naris and advanced into the posterior pharynx. Further anesthesia was obtained with 1% lidocaine and the scope was advanced into the trachea. Complete airway anesthesia was achieved with 1% lidocaine and a thorough airway examination was performed. This revealed the following.  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 Would DC vanc/cefepime if being given for PNA  Billy Fischer, MD ; [facility] [phone].  After 5:30 PM or weekends, call [phone]

## 2014-09-05 NOTE — Progress Notes (Signed)
INFECTIOUS DISEASE PROGRESS NOTE  ID: Rodney Walker is a 33 y.o. male with  Principal Problem:   Lung mass Active Problems:   CAD (coronary artery disease)   HIV disease  Subjective: Without complaints  Abtx:  Anti-infectives   Start     Dose/Rate Route Frequency Ordered Stop   09/03/14 2200  vancomycin (VANCOCIN) 1,250 mg in sodium chloride 0.9 % 250 mL IVPB     1,250 mg 166.7 mL/hr over 90 Minutes Intravenous Every 12 hours 09/03/14 2154     09/01/14 1000  dolutegravir (TIVICAY) tablet 50 mg     50 mg Oral Daily 08/31/14 2236     09/01/14 1000  sulfamethoxazole-trimethoprim (BACTRIM DS) 800-160 MG per tablet 1 tablet  Status:  Discontinued     1 tablet Oral Daily 08/31/14 2236 09/01/14 1156   09/01/14 1000  emtricitabine-tenofovir (TRUVADA) 200-300 MG per tablet 1 tablet     1 tablet Oral Daily 08/31/14 2236     08/31/14 2200  ceFEPIme (MAXIPIME) 1 g in dextrose 5 % 50 mL IVPB     1 g 100 mL/hr over 30 Minutes Intravenous Every 12 hours 08/31/14 2145     08/31/14 2200  vancomycin (VANCOCIN) IVPB 750 mg/150 ml premix  Status:  Discontinued     750 mg 150 mL/hr over 60 Minutes Intravenous Every 12 hours 08/31/14 2145 09/03/14 2155      Medications:  Scheduled: . ceFEPime (MAXIPIME) IV  1 g Intravenous Q12H  . dolutegravir  50 mg Oral Daily  . emtricitabine-tenofovir  1 tablet Oral Daily  . fentaNYL  25-100 mcg Intravenous Once  . lidocaine  1 application Topical Once  . vancomycin (VANCOCIN) 1250 mg IVPB  1,250 mg Intravenous Q12H    Objective: Vital signs in last 24 hours: Temp:  [97.8 F (36.6 C)-98 F (36.7 C)] 98 F (36.7 C) (09/09 0602) Pulse Rate:  [51-131] 76 (09/09 1430) Resp:  [11-29] 17 (09/09 1430) BP: (106-154)/(51-122) 128/76 mmHg (09/09 1430) SpO2:  [92 %-100 %] 94 % (09/09 1430)   General appearance: alert, cooperative and no distress Resp: clear to auscultation bilaterally Cardio: regular rate and rhythm GI: normal findings:  bowel sounds normal and soft, non-tender  Lab Results No results found for this basename: WBC, HGB, HCT, PLATELETS, NA, K, CL, CO2, BUN, CREATININE, GLU,  in the last 72 hours Liver Panel No results found for this basename: PROT, ALBUMIN, AST, ALT, ALKPHOS, BILITOT, BILIDIR, IBILI,  in the last 72 hours Sedimentation Rate No results found for this basename: ESRSEDRATE,  in the last 72 hours C-Reactive Protein No results found for this basename: CRP,  in the last 72 hours  Microbiology: Recent Results (from the past 240 hour(s))  CULTURE, BLOOD (ROUTINE X 2)     Status: None   Collection Time    08/31/14  9:36 PM      Result Value Ref Range Status   Specimen Description BLOOD LEFT ARM   Final   Special Requests BOTTLES DRAWN AEROBIC AND ANAEROBIC B 5CC R 4CC   Final   Culture  Setup Time     Final   Value: 09/01/2014 10:18     Performed at [facility]   Culture     Final   Value:        BLOOD CULTURE RECEIVED NO GROWTH TO DATE CULTURE WILL BE HELD FOR 5 DAYS BEFORE ISSUING A FINAL NEGATIVE REPORT     Performed at [facility]  Report Status PENDING   Incomplete  CULTURE, BLOOD (ROUTINE X 2)     Status: None   Collection Time    08/31/14  9:42 PM      Result Value Ref Range Status   Specimen Description BLOOD LEFT FOREARM   Final   Special Requests BOTTLES DRAWN AEROBIC AND ANAEROBIC 5CC   Final   Culture  Setup Time     Final   Value: 09/01/2014 10:16     Performed at [facility]   Culture     Final   Value:        BLOOD CULTURE RECEIVED NO GROWTH TO DATE CULTURE WILL BE HELD FOR 5 DAYS BEFORE ISSUING A FINAL NEGATIVE REPORT     Performed at [facility]   Report Status PENDING   Incomplete  AFB CULTURE WITH SMEAR     Status: None   Collection Time    09/02/14 10:11 AM      Result Value Ref Range Status   Specimen Description SPUTUM   Final   Special Requests Immunocompromised   Final   Acid Fast Smear     Final   Value: NO ACID FAST  BACILLI SEEN     Performed at [facility]   Culture     Final   Value: CULTURE WILL BE EXAMINED FOR 6 WEEKS BEFORE ISSUING A FINAL REPORT     Performed at [facility]   Report Status PENDING   Incomplete  AFB CULTURE WITH SMEAR     Status: None   Collection Time    09/05/14  1:45 PM      Result Value Ref Range Status   Specimen Description BRONCHIAL WASHINGS   Final   Special Requests SPECIMEN ALSO SENT FOR CYTOLOGY   Final   Acid Fast Smear PENDING   Incomplete   Culture PENDING   Incomplete   Report Status PENDING   Incomplete    Studies/Results: Dg Chest Port 1 View  09/05/2014   CLINICAL DATA:  Post bronchoscopy.  EXAM: PORTABLE CHEST - 1 VIEW  COMPARISON:  08/31/2014.  FINDINGS: Persistent absence of lung markings in the right apex, consistent with the bullous change seen on the recent CT scan of 08/31/2014. No evidence for pneumothorax status post bronchoscopy. No evidence for pneumomediastinum. Right upper lobe mass lesion again noted. No pleural effusion. The cardiopericardial silhouette is within normal limits for size. Telemetry leads overlie the chest.  IMPRESSION: No evidence for pneumothorax or pleural effusions status post bronchoscopy.   Electronically Signed   By: Kennith Center M.D.   On: 09/05/2014 14:49     Assessment/Plan: Lung Mass  AIDS  Prev PCP  Previous indeterminant quantiferon gold, now negative  Sputum AFB (-)   Total days of antibiotics: 7 cefepime, vanco  Agree with Dr Sharol Harness, will stop anbx Await 2nd afb, will stop isolation then.  Appreciate bronch. Await Bx of mass.         [name] Infectious Diseases (pager) [phone] [email or web address] 09/05/2014, 4:37 PM  LOS: 5 days

## 2014-09-05 NOTE — Care Management Note (Signed)
  Page 1 of 1   09/05/2014     11:19:40 AM CARE MANAGEMENT NOTE 09/05/2014  Patient:  Rodney Walker, Rodney Walker   Account Number:  [id number]  Date Initiated:  09/05/2014  Documentation initiated by:  Ronny Flurry  Subjective/Objective Assessment:     Action/Plan:   Anticipated DC Date:     Anticipated DC Plan:           Choice offered to / List presented to:             Status of service:   Medicare Important Message given?   (If response is "NO", the following Medicare IM given date fields will be blank) Date Medicare IM given:   Medicare IM given by:   Date Additional Medicare IM given:   Additional Medicare IM given by:    Discharge Disposition:    Per UR Regulation:    If discussed at Long Length of Stay Meetings, dates discussed:    Comments:  09-05-14 Spoke to patient at bedside.  Patient currently receives his 042 medications through [facility] at [facility] . Patient states he just got refills filled.  Explained to patient if TB medications needed at discharge , NCM will arrange through [facility] .  Ronny Flurry RN BSN [phone]

## 2014-09-05 NOTE — Progress Notes (Addendum)
Video Bronchoscopy performed  Intervention Bronchial Washing performed Intervention Biopsy performed Intervention Brushing performed  Pt tolerated well  Billy Fischer, MD ; [facility] service Mobile [phone].  After 5:30 PM or weekends, call [phone]

## 2014-09-06 ENCOUNTER — Encounter (HOSPITAL_COMMUNITY): Payer: Self-pay | Admitting: Pulmonary Disease

## 2014-09-06 LAB — ALPHA-1-ANTITRYPSIN: A-1 Antitrypsin, Ser: 140 mg/dL (ref 83–199)

## 2014-09-06 MED ORDER — FLUTICASONE PROPIONATE 50 MCG/ACT NA SUSP
1.0000 | Freq: Every day | NASAL | Status: DC
  Administered 2014-09-06: 1 via NASAL
  Filled 2014-09-06: qty 16

## 2014-09-06 MED ORDER — FLUTICASONE PROPIONATE 50 MCG/ACT NA SUSP: 1.0000 | Freq: Every day | NASAL | Status: DC | PRN

## 2014-09-06 MED ORDER — LORATADINE 10 MG PO TABS
10.0000 mg | ORAL_TABLET | Freq: Every day | ORAL | Status: DC
Start: 2014-09-06 — End: 2014-09-06
  Administered 2014-09-06: 10 mg via ORAL
  Filled 2014-09-06: qty 1

## 2014-09-06 MED ORDER — LORATADINE 10 MG PO TABS: 10.0000 mg | ORAL_TABLET | Freq: Every day | ORAL | Status: DC | PRN

## 2014-09-06 NOTE — Progress Notes (Signed)
Patient ID: Rodney Walker  male  ZOX:[id number]    DOB: August 06, 1981    DOA: 08/31/2014  PCP: No PCP Per Patient  33 y.o. male with HIV/AIDS who presents to the ED with complaints of productive cough and chest congestion found to have lung mass, with cavitary lesion   Assessment/Plan: Principal Problem:   Lung mass: Rule out TB versus lung cancer vs abscess - CT of the chest showed 3.8 cm soft tissue mass in the medial right upper lobe with associated right upper lobe clustered nodularity - cont isolation r/o TB, atypical inf - gold TB interferon negative ID following  - Pulmonology consulted, patient underwent bronchoscopy, pathology negative for malignancy - AFB x1 neg, 2nd pending - vancomycin and cefepime discontinued by pulmonology  Active Problems:   CAD (coronary artery disease) - Stable    HIV disease -ID following on HAART    DVT Prophylaxis:  Code Status:  Family Communication:  Disposition: Possible DC today, awaiting further recommendations from ID and pulmonology  Consultants:  Infectious disease    pulmonology  Procedures:  Bronchoscopy today   Antibiotics:  IV vancomycin  IV cefepime   Subjective: Patient seen and examined,no acute complaints, having some sinus congestion  Objective: Weight change:   Intake/Output Summary (Last 24 hours) at 09/06/14 1117 Last data filed at 09/05/14 2209  Gross per 24 hour  Intake    820 ml  Output      0 ml  Net    820 ml   Blood pressure 119/68, pulse 74, temperature 98.3 F (36.8 C), temperature source Oral, resp. rate 17, height  (1.626 m), weight 65.772 kg (145 lb), SpO2 98.00%.  Physical Exam: General: Alert and awake, oriented x3, not in any acute distress. CVS: S1-S2 clear, no murmur rubs or gallops Chest: CTA B Abdomen: soft NT, ND, NBS Extremities: no cyanosis, clubbing or edema noted bilaterally Neuro: Cranial nerves II-XII intact, no focal neurological deficits  Lab  Results: Basic Metabolic Panel:  Recent Labs Lab 08/31/14 1339 09/01/14 0402  NA 136* 139  K 4.2 3.8  CL 99 103  CO2 27 25  GLUCOSE 77 98  BUN 9 10  CREATININE 1.19 1.19  CALCIUM 9.5 8.7   Liver Function Tests: No results found for this basename: AST, ALT, ALKPHOS, BILITOT, PROT, ALBUMIN,  in the last 168 hours No results found for this basename: LIPASE, AMYLASE,  in the last 168 hours No results found for this basename: AMMONIA,  in the last 168 hours CBC:  Recent Labs Lab 08/31/14 1339 09/01/14 0402  WBC 6.0 5.0  HGB 12.7* 12.6*  HCT 36.9* 34.8*  MCV 86.0 86.6  PLT 322 258   Cardiac Enzymes: No results found for this basename: CKTOTAL, CKMB, CKMBINDEX, TROPONINI,  in the last 168 hours BNP: No components found with this basename: POCBNP,  CBG: No results found for this basename: GLUCAP,  in the last 168 hours   Micro Results: Recent Results (from the past 240 hour(s))  CULTURE, BLOOD (ROUTINE X 2)     Status: None   Collection Time    08/31/14  9:36 PM      Result Value Ref Range Status   Specimen Description BLOOD LEFT ARM   Final   Special Requests BOTTLES DRAWN AEROBIC AND ANAEROBIC B 5CC R 4CC   Final   Culture  Setup Time     Final   Value: 09/01/2014 10:18     Performed at [facility]  Culture     Final   Value:        BLOOD CULTURE RECEIVED NO GROWTH TO DATE CULTURE WILL BE HELD FOR 5 DAYS BEFORE ISSUING A FINAL NEGATIVE REPORT     Performed at [facility]   Report Status PENDING   Incomplete  CULTURE, BLOOD (ROUTINE X 2)     Status: None   Collection Time    08/31/14  9:42 PM      Result Value Ref Range Status   Specimen Description BLOOD LEFT FOREARM   Final   Special Requests BOTTLES DRAWN AEROBIC AND ANAEROBIC 5CC   Final   Culture  Setup Time     Final   Value: 09/01/2014 10:16     Performed at [facility]   Culture     Final   Value:        BLOOD CULTURE RECEIVED NO GROWTH TO DATE CULTURE WILL BE HELD FOR 5  DAYS BEFORE ISSUING A FINAL NEGATIVE REPORT     Performed at [facility]   Report Status PENDING   Incomplete  AFB CULTURE WITH SMEAR     Status: None   Collection Time    09/02/14 10:11 AM      Result Value Ref Range Status   Specimen Description SPUTUM   Final   Special Requests Immunocompromised   Final   Acid Fast Smear     Final   Value: NO ACID FAST BACILLI SEEN     Performed at [facility]   Culture     Final   Value: CULTURE WILL BE EXAMINED FOR 6 WEEKS BEFORE ISSUING A FINAL REPORT     Performed at [facility]   Report Status PENDING   Incomplete  AFB CULTURE WITH SMEAR     Status: None   Collection Time    09/05/14  1:45 PM      Result Value Ref Range Status   Specimen Description BRONCHIAL WASHINGS   Final   Special Requests SPECIMEN ALSO SENT FOR CYTOLOGY   Final   Acid Fast Smear PENDING   Incomplete   Culture PENDING   Incomplete   Report Status PENDING   Incomplete    Studies/Results: Dg Chest 2 View  08/31/2014   CLINICAL DATA:  Chest pain and cough congestion 2 days history of smoking and COPD  EXAM: CHEST  2 VIEW  COMPARISON:  04/22/2014  FINDINGS: Heart size and vascular pattern are normal. Left lung is clear with resolution of upper lobe infiltrate. On the right, there is bullous change in the upper lobe as seen previously. However, there is now also a rounded 32 x 37 mm suprahilar masslike density. No pleural effusion. Hyperinflation consistent with COPD, stable.  IMPRESSION: Recommend contrast-enhanced CT thorax to further evaluate rounded masslike density right upper lobe, suspicious for malignancy.   Electronically Signed   By: Esperanza Heir M.D.   On: 08/31/2014 17:33   Ct Chest W Contrast  08/31/2014   CLINICAL DATA:  Cough, abnormal chest radiograph, HIV.  EXAM: CT CHEST WITH CONTRAST  TECHNIQUE: Multidetector CT imaging of the chest was performed during intravenous contrast administration.  CONTRAST:  OMNIPAQUE IOHEXOL 300  MG/ML  SOLN  COMPARISON:  Chest radiograph dated 08/31/2014. CTA chest dated 03/18/2014.  FINDINGS: 3.8 x 2.7 x 2.8 cm soft tissue mass in the medial right upper lobe (series 3/ image 21), accounting for the radiographic abnormality, new from prior CT. Lesion appears low-density/  necrotic on soft tissue windows (series 5/ image 45). Associated clustered satellite nodularity in the right upper lobe (series 3/ images 19-22).  Additional minimal clustered nodularity in the posterior left upper lobe (series 3/image 20) with an additional 3-4 mm nodule in the lateral left upper lobe (series 3/image 14). Underlying moderate paraseptal emphysematous changes with bullous changes at the right lung apex.  Overall, this appearance is most suggestive of infection, possibly atypical infection such as TB. HIV related malignancies including Kaposi's sarcoma and lymphoma remain possible. Given the patient's age, as well as the lack of any corresponding abnormality on prior CT, primary bronchogenic neoplasm is considered unlikely, although the patient does have underlying emphysematous changes.  Visualized thyroid is unremarkable.  The heart is normal in size.  No pericardial effusion.  Thoracic lymphadenopathy, including:  --13 mm short axis high right paratracheal node (series 2/ image 15)  --11 mm short axis necrotic right paratracheal node (series 2/image 20)  --10 mm short axis right hilar node (series 2/image 28)  Visualized upper abdomen is unremarkable.  Visualized osseous structures are within normal limits.  IMPRESSION: 3.8 cm soft tissue mass in the medial right upper lobe with associated right upper lobe clustered nodularity.  This appearance is most suggestive of infection, possibly atypical infection such as TB. HIV related malignancies including Kaposi's sarcoma and lymphoma remain possible. Primary bronchogenic neoplasm is considered unlikely.  Consider sputum culture and/or bronchoscopy as clinically warranted. At a  minimum, follow-up imaging is suggested to document resolution following appropriate antimicrobial therapy.   Electronically Signed   By: Charline Bills M.D.   On: 08/31/2014 19:12    Medications: Scheduled Meds: . dolutegravir  50 mg Oral Daily  . emtricitabine-tenofovir  1 tablet Oral Daily  . fentaNYL  25-100 mcg Intravenous Once  . lidocaine  1 application Topical Once      LOS: 6 days   Damonta Cossey M.D. [facility] 09/06/2014, 11:17 AM Pager: [phone]  If 7PM-7AM, please contact night-coverage [email or web address] Password [email or web address]  **Disclaimer: This note was dictated with voice recognition software. Similar sounding words can inadvertently be transcribed and this note may contain transcription errors which may not have been corrected upon publication of note.**

## 2014-09-06 NOTE — Progress Notes (Signed)
Patient left AMA. States dr Ninetta Lights told patient he could be d/c to home but no d/c orders were given paged and spoke with dr Isidoro Donning who states patient would need to be foloowed possibly one more night and she would evaluate for possible d/c. Patient refused to stay. VSS. Pain denied and AMA paper signed. Left floor ambulating with friend steady gait.  And no concerns.

## 2014-09-06 NOTE — Discharge Summary (Signed)
AMA note  Patient ID: Rodney Walker MRN: [id number] DOB/AGE: 30-Oct-1981 33 y.o.  Admit date: 08/31/2014 Discharge date: 09/06/2014  Primary Care Physician:  No PCP Per Patient  Discharge Diagnoses:    . cavitary Lung mass . HIV disease . CAD (coronary artery disease)  Consults: Pulmonology, Dr. Sung Amabile                   Infectious disease, Dr. Ninetta Lights        Allergies:  No Known Allergies   Discharge Medications:   Patient left AMA      Brief H and P: For complete details please refer to admission H and P, but in brief Rodney Walker is a 33 y.o. male with HIV/AIDS who presents to the ED with complaints of productive cough and chest congestion x 3 days, he denies fevers or chills and hemoptysis. He reports coughing up greenish sputum. He has chest pain with coughing. He reports taking his HAART Rx daily and has only missed 1 dose. He reports that his CD4 count at the first of this month was in the 200's. His ID physician is Dr Daiva Eves. He was found to have a cavitating mass in the right middle lobe. He was placed in isolation and referred for admission.    Hospital Course:  33 y.o. male with HIV/AIDS who presents to the ED with complaints of productive cough and chest congestion found to have lung mass, with cavitary lesion  Lung mass: Rule out TB versus lung cancer vs abscess  - CT of the chest showed 3.8 cm soft tissue mass in the medial right upper lobe with associated right upper lobe clustered nodularity . Patient was placed in airborne isolation ruling out TB, atypical infection. G was old TB interferon negative. Pulmonology was consulted and patient underwent bronchoscopy on 09/05/2014. - Pulmonology consulted, patient underwent bronchoscopy, pathology showed benign respiratory inflamed mucosa, negative for malignancy. I spoke with Dr. Sung Amabile who felt that patient may have false negative biopsy and may need further investigation with EBUS. Patient  was admitted with disease in the right upper lobes. Cytology subsequently also came back as negative. Patient did not want any further testing, nor did he want to wait for pulmonology patient regarding further investigation of the mass. Patient left AMA.  CAD (coronary artery disease) - Stable   HIV disease -ID following on HAART      Day of Discharge BP 116/72  Pulse 64  Temp(Src) 97.7 F (36.5 C) (Oral)  Resp 16  Ht  (1.626 m)  Wt 65.772 kg (145 lb)  BMI 24.88 kg/m2  SpO2 100%  Physical Exam: General: Alert and awake oriented x3 not in any acute distress. CVS: S1-S2 clear no murmur rubs or gallops Chest: Decreased breath sounds in the upper lobes Abdomen: soft nontender, nondistended, normal bowel sounds Extremities: no cyanosis, clubbing or edema noted bilaterally Neuro: Cranial nerves II-XII intact, no focal neurological deficits   The results of significant diagnostics from this hospitalization (including imaging, microbiology, ancillary and laboratory) are listed below for reference.    LAB RESULTS: Basic Metabolic Panel:  Recent Labs Lab 08/31/14 1339 09/01/14 0402  NA 136* 139  K 4.2 3.8  CL 99 103  CO2 27 25  GLUCOSE 77 98  BUN 9 10  CREATININE 1.19 1.19  CALCIUM 9.5 8.7   Liver Function Tests: No results found for this basename: AST, ALT, ALKPHOS, BILITOT, PROT, ALBUMIN,  in the last 168 hours No results  found for this basename: LIPASE, AMYLASE,  in the last 168 hours No results found for this basename: AMMONIA,  in the last 168 hours CBC:  Recent Labs Lab 08/31/14 1339 09/01/14 0402  WBC 6.0 5.0  HGB 12.7* 12.6*  HCT 36.9* 34.8*  MCV 86.0 86.6  PLT 322 258   Cardiac Enzymes: No results found for this basename: CKTOTAL, CKMB, CKMBINDEX, TROPONINI,  in the last 168 hours BNP: No components found with this basename: POCBNP,  CBG: No results found for this basename: GLUCAP,  in the last 168 hours  Significant Diagnostic Studies:  Dg  Chest 2 View  08/31/2014   CLINICAL DATA:  Chest pain and cough congestion 2 days history of smoking and COPD  EXAM: CHEST  2 VIEW  COMPARISON:  04/22/2014  FINDINGS: Heart size and vascular pattern are normal. Left lung is clear with resolution of upper lobe infiltrate. On the right, there is bullous change in the upper lobe as seen previously. However, there is now also a rounded 32 x 37 mm suprahilar masslike density. No pleural effusion. Hyperinflation consistent with COPD, stable.  IMPRESSION: Recommend contrast-enhanced CT thorax to further evaluate rounded masslike density right upper lobe, suspicious for malignancy.   Electronically Signed   By: Esperanza Heir M.D.   On: 08/31/2014 17:33   Ct Chest W Contrast  08/31/2014   CLINICAL DATA:  Cough, abnormal chest radiograph, HIV.  EXAM: CT CHEST WITH CONTRAST  TECHNIQUE: Multidetector CT imaging of the chest was performed during intravenous contrast administration.  CONTRAST:  OMNIPAQUE IOHEXOL 300 MG/ML  SOLN  COMPARISON:  Chest radiograph dated 08/31/2014. CTA chest dated 03/18/2014.  FINDINGS: 3.8 x 2.7 x 2.8 cm soft tissue mass in the medial right upper lobe (series 3/ image 21), accounting for the radiographic abnormality, new from prior CT. Lesion appears low-density/ necrotic on soft tissue windows (series 5/ image 45). Associated clustered satellite nodularity in the right upper lobe (series 3/ images 19-22).  Additional minimal clustered nodularity in the posterior left upper lobe (series 3/image 20) with an additional 3-4 mm nodule in the lateral left upper lobe (series 3/image 14). Underlying moderate paraseptal emphysematous changes with bullous changes at the right lung apex.  Overall, this appearance is most suggestive of infection, possibly atypical infection such as TB. HIV related malignancies including Kaposi's sarcoma and lymphoma remain possible. Given the patient's age, as well as the lack of any corresponding abnormality on prior  CT, primary bronchogenic neoplasm is considered unlikely, although the patient does have underlying emphysematous changes.  Visualized thyroid is unremarkable.  The heart is normal in size.  No pericardial effusion.  Thoracic lymphadenopathy, including:  --13 mm short axis high right paratracheal node (series 2/ image 15)  --11 mm short axis necrotic right paratracheal node (series 2/image 20)  --10 mm short axis right hilar node (series 2/image 28)  Visualized upper abdomen is unremarkable.  Visualized osseous structures are within normal limits.  IMPRESSION: 3.8 cm soft tissue mass in the medial right upper lobe with associated right upper lobe clustered nodularity.  This appearance is most suggestive of infection, possibly atypical infection such as TB. HIV related malignancies including Kaposi's sarcoma and lymphoma remain possible. Primary bronchogenic neoplasm is considered unlikely.  Consider sputum culture and/or bronchoscopy as clinically warranted. At a minimum, follow-up imaging is suggested to document resolution following appropriate antimicrobial therapy.   Electronically Signed   By: Charline Bills M.D.   On: 08/31/2014 19:12    2D  ECHO:   Disposition and Follow-up:   DISCHARGE FOLLOW-UP     Follow-up Information   Follow up with [facility] On 10/24/2014. (at 11:00am)    Contact information:   [address] [place] [address]       Follow up with Max Fickle, MD On 09/25/2014. (Appt at 1:45.  )    Specialty:  Pulmonary Disease   Contact information:   [address] [place] [address] [phone]      No charge  Signed:   Aja Whitehair M.D. [facility] 09/06/2014, 8:13 PM Pager: [phone]   **Disclaimer: This note was dictated with voice recognition software. Similar sounding words can inadvertently be transcribed and this note may contain transcription errors which may not have been corrected upon publication of  note.**

## 2014-09-06 NOTE — Progress Notes (Signed)
INFECTIOUS DISEASE PROGRESS NOTE  ID: Rodney Walker is a 33 y.o. male with  Principal Problem:   Lung mass Active Problems:   CAD (coronary artery disease)   HIV disease  Subjective: Without complaints.  Abtx:  Anti-infectives   Start     Dose/Rate Route Frequency Ordered Stop   09/03/14 2200  vancomycin (VANCOCIN) 1,250 mg in sodium chloride 0.9 % 250 mL IVPB  Status:  Discontinued     1,250 mg 166.7 mL/hr over 90 Minutes Intravenous Every 12 hours 09/03/14 2154 09/05/14 1643   09/01/14 1000  dolutegravir (TIVICAY) tablet 50 mg     50 mg Oral Daily 08/31/14 2236     09/01/14 1000  sulfamethoxazole-trimethoprim (BACTRIM DS) 800-160 MG per tablet 1 tablet  Status:  Discontinued     1 tablet Oral Daily 08/31/14 2236 09/01/14 1156   09/01/14 1000  emtricitabine-tenofovir (TRUVADA) 200-300 MG per tablet 1 tablet     1 tablet Oral Daily 08/31/14 2236     08/31/14 2200  ceFEPIme (MAXIPIME) 1 g in dextrose 5 % 50 mL IVPB  Status:  Discontinued     1 g 100 mL/hr over 30 Minutes Intravenous Every 12 hours 08/31/14 2145 09/05/14 1643   08/31/14 2200  vancomycin (VANCOCIN) IVPB 750 mg/150 ml premix  Status:  Discontinued     750 mg 150 mL/hr over 60 Minutes Intravenous Every 12 hours 08/31/14 2145 09/03/14 2155      Medications:  Scheduled: . dolutegravir  50 mg Oral Daily  . emtricitabine-tenofovir  1 tablet Oral Daily  . fentaNYL  25-100 mcg Intravenous Once  . fluticasone  1 spray Each Nare Daily  . lidocaine  1 application Topical Once  . loratadine  10 mg Oral Daily    Objective: Vital signs in last 24 hours: Temp:  [97.7 F (36.5 C)-98.3 F (36.8 C)] 97.7 F (36.5 C) (09/10 1403) Pulse Rate:  [60-74] 64 (09/10 1403) Resp:  [16-18] 16 (09/10 1403) BP: (116-119)/(58-72) 116/72 mmHg (09/10 1403) SpO2:  [98 %-100 %] 100 % (09/10 1403)   General appearance: alert, cooperative and no distress  Lab Results No results found for this basename: WBC, HGB, HCT,  PLATELETS, NA, K, CL, CO2, BUN, CREATININE, GLU,  in the last 72 hours Liver Panel No results found for this basename: PROT, ALBUMIN, AST, ALT, ALKPHOS, BILITOT, BILIDIR, IBILI,  in the last 72 hours Sedimentation Rate No results found for this basename: ESRSEDRATE,  in the last 72 hours C-Reactive Protein No results found for this basename: CRP,  in the last 72 hours  Microbiology: Recent Results (from the past 240 hour(s))  CULTURE, BLOOD (ROUTINE X 2)     Status: None   Collection Time    08/31/14  9:36 PM      Result Value Ref Range Status   Specimen Description BLOOD LEFT ARM   Final   Special Requests BOTTLES DRAWN AEROBIC AND ANAEROBIC B 5CC R 4CC   Final   Culture  Setup Time     Final   Value: 09/01/2014 10:18     Performed at [facility]   Culture     Final   Value:        BLOOD CULTURE RECEIVED NO GROWTH TO DATE CULTURE WILL BE HELD FOR 5 DAYS BEFORE ISSUING A FINAL NEGATIVE REPORT     Performed at [facility]   Report Status PENDING   Incomplete  CULTURE, BLOOD (ROUTINE X 2)  Status: None   Collection Time    08/31/14  9:42 PM      Result Value Ref Range Status   Specimen Description BLOOD LEFT FOREARM   Final   Special Requests BOTTLES DRAWN AEROBIC AND ANAEROBIC 5CC   Final   Culture  Setup Time     Final   Value: 09/01/2014 10:16     Performed at [facility]   Culture     Final   Value:        BLOOD CULTURE RECEIVED NO GROWTH TO DATE CULTURE WILL BE HELD FOR 5 DAYS BEFORE ISSUING A FINAL NEGATIVE REPORT     Performed at [facility]   Report Status PENDING   Incomplete  AFB CULTURE WITH SMEAR     Status: None   Collection Time    09/02/14 10:11 AM      Result Value Ref Range Status   Specimen Description SPUTUM   Final   Special Requests Immunocompromised   Final   Acid Fast Smear     Final   Value: NO ACID FAST BACILLI SEEN     Performed at [facility]   Culture     Final   Value: CULTURE WILL BE EXAMINED FOR  6 WEEKS BEFORE ISSUING A FINAL REPORT     Performed at [facility]   Report Status PENDING   Incomplete  AFB CULTURE WITH SMEAR     Status: None   Collection Time    09/05/14  1:45 PM      Result Value Ref Range Status   Specimen Description BRONCHIAL WASHINGS   Final   Special Requests SPECIMEN ALSO SENT FOR CYTOLOGY   Final   Acid Fast Smear     Final   Value: NO ACID FAST BACILLI SEEN     Performed at [facility]   Culture     Final   Value: CULTURE WILL BE EXAMINED FOR 6 WEEKS BEFORE ISSUING A FINAL REPORT     Performed at [facility]   Report Status PENDING   Incomplete    Studies/Results: Dg Chest Port 1 View  09/05/2014   CLINICAL DATA:  Post bronchoscopy.  EXAM: PORTABLE CHEST - 1 VIEW  COMPARISON:  08/31/2014.  FINDINGS: Persistent absence of lung markings in the right apex, consistent with the bullous change seen on the recent CT scan of 08/31/2014. No evidence for pneumothorax status post bronchoscopy. No evidence for pneumomediastinum. Right upper lobe mass lesion again noted. No pleural effusion. The cardiopericardial silhouette is within normal limits for size. Telemetry leads overlie the chest.  IMPRESSION: No evidence for pneumothorax or pleural effusions status post bronchoscopy.   Electronically Signed   By: Kennith Center M.D.   On: 09/05/2014 14:49     Assessment/Plan: Lung Mass  AIDS  Prev PCP  Previous indeterminant quantiferon gold, now negative  Sputum AFB (-)   Total days of antibiotics: off       Spoke with pulmpnary - they will f/u with them in 2 weeks.  Reviewed path. Granuloma on 1 cytology but not on bx.  will have him f/u in ID 09-26-14 (9am)     [name] Infectious Diseases (pager) [phone] [email or web address] 09/06/2014, 4:43 PM  LOS: 6 days

## 2014-09-06 NOTE — Progress Notes (Addendum)
Awaiting pathology results.  We will follow up and discuss results with patient once results returned.   Pre-arranged follow up with Pulmonary MD.       Canary Brim, NP-C [facility] Pgr: [phone] or [phone]

## 2014-09-07 ENCOUNTER — Encounter: Payer: Self-pay | Admitting: Cardiovascular Disease

## 2014-09-07 LAB — CULTURE, BLOOD (ROUTINE X 2)
Culture: NO GROWTH
Culture: NO GROWTH

## 2014-09-12 LAB — HIV-1 INTEGRASE GENOTYPE
Date Viral Load Collected: NO GROWTH
Value last viral load: NO GROWTH

## 2014-09-25 ENCOUNTER — Encounter: Payer: Self-pay | Admitting: Pulmonary Disease

## 2014-09-25 ENCOUNTER — Ambulatory Visit (INDEPENDENT_AMBULATORY_CARE_PROVIDER_SITE_OTHER): Payer: No Typology Code available for payment source | Admitting: Pulmonary Disease

## 2014-09-25 VITALS — BP 100/68 | HR 85 | Temp 98.0°F | Ht 64.0 in | Wt 139.8 lb

## 2014-09-25 DIAGNOSIS — R222 Localized swelling, mass and lump, trunk: Secondary | ICD-10-CM

## 2014-09-25 DIAGNOSIS — J984 Other disorders of lung: Secondary | ICD-10-CM

## 2014-09-25 DIAGNOSIS — R918 Other nonspecific abnormal finding of lung field: Secondary | ICD-10-CM

## 2014-09-25 NOTE — Progress Notes (Signed)
Subjective:    Patient ID: Rodney Walker, male    DOB: 09/30/1981, 33 y.o.   MRN: [id number]  Synopsis: Rodney Walker first saw the [facility] in September 2015 after followup from a hospitalization earlier that month. During that time he was found to have a right upper lobe mass with mediastinal lymphadenopathy. Sputum AFB smears as well as quantiferon gold serology testing were normal. He underwent a bronchoscopy which demonstrated a fungating mass in the right upper lobe. Biopsies were negative for malignancy but just showed inflammation. There was granulomatous inflammation seen on a brushing.  He has smoked off and on (never more than 4-5 cigarettes a day) for ten years.  HPI  09/25/2014 ROV > Rodney Walker states that he still coughs some every once ina while.  He still coughs up thin clear mucus, no blood in it.  No green color to the mucus.  He has not had dyspnea, no trouble breathing.  He continues to take bactrim daily. He has not had chest pain or weight loss. His energy level and appetite are good. He is here to followup after his recent hospitalization during which time he had a bronchoscopy. He has been taking his anti-retroviral therapy as directed by the infectious diseases clinic.   Past Medical History  Diagnosis Date  . Chest pain 2010  . Hyperlipidemia   . Coronary artery disease     SPONTANEOUS DISSECTION AND PLAQUE RUPTURE OF HIS LEFT ANTERIOR DESCENDING ARTERY  . MI (myocardial infarction)   . HIV infection      Review of Systems  Constitutional: Negative for fever, chills, activity change and appetite change.  HENT: Negative for congestion, ear pain, hearing loss, postnasal drip, rhinorrhea, sinus pressure and sneezing.   Eyes: Negative for redness, itching and visual disturbance.  Respiratory: Negative for cough, chest tightness, shortness of breath and wheezing.   Cardiovascular: Negative for chest pain, palpitations and leg swelling.    Gastrointestinal: Negative for nausea, vomiting, abdominal pain, diarrhea, constipation, blood in stool and abdominal distention.  Musculoskeletal: Negative for arthralgias, gait problem, joint swelling, myalgias, neck pain and neck stiffness.  Skin: Negative for rash.  Neurological: Negative for dizziness, light-headedness, numbness and headaches.  Hematological: Does not bruise/bleed easily.  Psychiatric/Behavioral: Negative for confusion and dysphoric mood.       Objective:   Physical Exam Filed Vitals:   09/25/14 1346  BP: 100/68  Pulse: 85  Temp: 98 F (36.7 C)  TempSrc: Oral  Height: 5\' 4"  (1.626 m)  Weight: 139 lb 12.8 oz (63.413 kg)  SpO2: 100%   Gen: well appearing, no acute distress HEENT: NCAT, PERRL, EOMi, OP clear, neck supple without masses PULM: CTA B CV: RRR, no mgr, no JVD AB: BS+, soft, nontender, no hsm Ext: warm, no edema, no clubbing, no cyanosis Derm: no rash or skin breakdown Neuro: A&Ox4, CN II-XII intact, strength 5/5 in all 4 extremities   September 2014 CT chest> right upper lobe lung mass with mediastinal lymphadenopathy 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 - THERE IS NO EVIDENCE OF MALIGNANCY OR GRANULOMATA. Lung, biopsy, RUL - BENIGN INFLAMED RESPIRATORY MUCOSA AND CARTILAGE. - THERE IS NO EVIDENCE OF MALIGNANCY OR GRANULOMATA. BRONCHIAL WASHING (SPECIMEN 1 OF 2, COLLECTED ON 09/05/2014): NO MALIGNANT CELLS IDENTIFIED.       Assessment & Plan:   Lung mass The differential diagnosis for this lung mass is broad and  includes Kaposi's sarcoma, lymphoma, primary lung cancer, sarcoidosis, or an atypical infection. With HIV a lung mass with mediastinal lymphadenopathy does have a high likelihood of mycobacterial disease.  I think that this is likely considering the granulomatous inflammation seen on the endobronchial brushing.  I do favor an inflammatory  or infectious cause over neoplastic considering the fact that his April 2015 CT chest was normal.  This needs close monitoring, but at this point the best approach is to wait until the BAL AFB culture is complete. If that test is normal then the next best step would be to perform a mediastinoscopy with possible right upper lobe mass resection. I do not believe that approaching this again with a bronchoscopy would yield new information.  Plan: -Followup BAL culture, if negative then referred to thoracic surgery for mediastinoscopy and possibly right upper lobe mass resection    Updated Medication List Outpatient Encounter Prescriptions as of 09/25/2014  Medication Sig  . albuterol (PROVENTIL HFA;VENTOLIN HFA) 108 (90 BASE) MCG/ACT inhaler Inhale 2 puffs into the lungs every 6 (six) hours as needed for wheezing or shortness of breath.  . dolutegravir (TIVICAY) 50 MG tablet Take 1 tablet (50 mg total) by mouth daily.  Marland Kitchen emtricitabine-tenofovir (TRUVADA) 200-300 MG per tablet Take 1 tablet by mouth daily.  . fluticasone (FLONASE) 50 MCG/ACT nasal spray Place 1 spray into both nostrils daily as needed for allergies or rhinitis.  Marland Kitchen ibuprofen (ADVIL,MOTRIN) 800 MG tablet Take 400 mg by mouth every 8 (eight) hours as needed (for pain.).  Marland Kitchen sulfamethoxazole-trimethoprim (BACTRIM DS) 800-160 MG per tablet Take 1 tablet by mouth daily.  . [DISCONTINUED] loratadine (CLARITIN) 10 MG tablet Take 1 tablet (10 mg total) by mouth daily as needed for allergies or rhinitis.

## 2014-09-25 NOTE — Assessment & Plan Note (Addendum)
The differential diagnosis for this lung mass is broad and includes Kaposi's sarcoma, lymphoma, primary lung cancer, sarcoidosis, or an atypical infection. With HIV a lung mass with mediastinal lymphadenopathy does have a high likelihood of mycobacterial disease.  I think that this is likely considering the granulomatous inflammation seen on the endobronchial brushing.  I do favor an inflammatory or infectious cause over neoplastic considering the fact that his April 2015 CT chest was normal.  This needs close monitoring, but at this point the best approach is to wait until the BAL AFB culture is complete. If that test is normal then the next best step would be to perform a mediastinoscopy with possible right upper lobe mass resection. I do not believe that approaching this again with a bronchoscopy would yield new information.  Plan: -Followup BAL culture, if negative then referred to thoracic surgery for mediastinoscopy and possibly right upper lobe mass resection

## 2014-09-25 NOTE — Patient Instructions (Signed)
Have Dr. Drue Second call me tomorrow to discuss your CT scan We will repeat a CT chest in one month and see you back after that

## 2014-09-26 ENCOUNTER — Encounter: Payer: Self-pay | Admitting: Internal Medicine

## 2014-09-26 ENCOUNTER — Ambulatory Visit (INDEPENDENT_AMBULATORY_CARE_PROVIDER_SITE_OTHER): Payer: No Typology Code available for payment source | Admitting: Internal Medicine

## 2014-09-26 VITALS — BP 115/72 | HR 73 | Temp 98.1°F | Ht 64.0 in | Wt 140.5 lb

## 2014-09-26 DIAGNOSIS — R222 Localized swelling, mass and lump, trunk: Secondary | ICD-10-CM

## 2014-09-26 DIAGNOSIS — Z23 Encounter for immunization: Secondary | ICD-10-CM

## 2014-09-26 DIAGNOSIS — B20 Human immunodeficiency virus [HIV] disease: Secondary | ICD-10-CM

## 2014-09-26 DIAGNOSIS — R918 Other nonspecific abnormal finding of lung field: Secondary | ICD-10-CM

## 2014-09-26 NOTE — Progress Notes (Signed)
Patient ID: Rodney Walker, male   DOB: November 28, 1981, 33 y.o.   MRN: [id number]       Patient ID: Rodney Walker, male   DOB: 09/25/1981, 33 y.o.   MRN: [id number]  HPI CD 4 count of 300/VL 50 (sep 2015) on DLG,truvada plus oi proph of bactrim since cd 4 count nadir 20. Also found to have RUL mass and bullous lesions in recent Sept CT which was new incomparing to CT scans done in Spring 2014, did undergo bronch on 09/05/2014,where biopsy sent for path, brushing sent for cytology and washings sent for AFB cultures negative. No specimen sent for fungal culture. quantiferon negative. Followed by Dr. Kendrick Fries at [facility]. Patient still has cough but no DOE.  Outpatient Encounter Prescriptions as of 09/26/2014  Medication Sig  . albuterol (PROVENTIL HFA;VENTOLIN HFA) 108 (90 BASE) MCG/ACT inhaler Inhale 2 puffs into the lungs every 6 (six) hours as needed for wheezing or shortness of breath.  . dolutegravir (TIVICAY) 50 MG tablet Take 1 tablet (50 mg total) by mouth daily.  Marland Kitchen emtricitabine-tenofovir (TRUVADA) 200-300 MG per tablet Take 1 tablet by mouth daily.  . fluticasone (FLONASE) 50 MCG/ACT nasal spray Place 1 spray into both nostrils daily as needed for allergies or rhinitis.  Marland Kitchen ibuprofen (ADVIL,MOTRIN) 800 MG tablet Take 400 mg by mouth every 8 (eight) hours as needed (for pain.).  Marland Kitchen sulfamethoxazole-trimethoprim (BACTRIM DS) 800-160 MG per tablet Take 1 tablet by mouth daily.     Patient Active Problem List   Diagnosis Date Noted  . Lung mass 08/31/2014  . HIV disease 08/31/2014  . 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 Myalgia 04/23/2014  . Pharyngitis 03/31/2014  . Hyponatremia 03/31/2014  . Fever 03/30/2014  . AIDS 03/22/2014  . Thrush 03/22/2014  . CAP (community acquired pneumonia) 03/22/2014  . CAD (coronary artery disease) 11/13/2011     Health Maintenance Due  Topic Date Due  . Tetanus/tdap  10/18/1999     Review of Systems occ cough,  otherwise 10 point ros is negative Physical Exam   BP 115/72  Pulse 73  Temp(Src) 98.1 F (36.7 C) (Oral)  Ht 5\' 4"  (1.626 m)  Wt 140 lb 8 oz (63.73 kg)  BMI 24.10 kg/m2 Physical Exam  Constitutional: He is oriented to person, place, and time. He appears well-developed and well-nourished. No distress.  HENT:  Mouth/Throat: Oropharynx is clear and moist. No oropharyngeal exudate.  Cardiovascular: Normal rate, regular rhythm and normal heart sounds. Exam reveals no gallop and no friction rub.  No murmur heard.  Pulmonary/Chest: Effort normal and breath sounds normal. No respiratory distress. He has no wheezes.   Lymphadenopathy:  He has no cervical adenopathy.  Skin: Skin is warm and dry. No rash noted. No erythema.     Lab Results  Component Value Date   CD4TCELL 29* 08/31/2014   Lab Results  Component Value Date   CD4TABS 300* 08/31/2014   CD4TABS 260* 07/09/2014   CD4TABS 120* 04/19/2014   Lab Results  Component Value Date   HIV1RNAQUANT 50* 09/01/2014   No results found for this basename: HEPBSAB   No results found for this basename: RPR    CBC Lab Results  Component Value Date   WBC 5.0 09/01/2014   RBC 4.02* 09/01/2014   HGB 12.6* 09/01/2014   HCT 34.8* 09/01/2014   PLT 258 09/01/2014   MCV 86.6 09/01/2014   MCH 31.3 09/01/2014   MCHC 36.2* 09/01/2014   RDW 13.9 09/01/2014   LYMPHSABS  1.0 07/09/2014   MONOABS 0.5 07/09/2014   EOSABS 0.1 07/09/2014   BASOSABS 0.0 07/09/2014   BMET Lab Results  Component Value Date   NA 139 09/01/2014   K 3.8 09/01/2014   CL 103 09/01/2014   CO2 25 09/01/2014   GLUCOSE 98 09/01/2014   BUN 10 09/01/2014   CREATININE 1.19 09/01/2014   CALCIUM 8.7 09/01/2014   GFRNONAA 79* 09/01/2014   GFRAA >90 09/01/2014   Imaging: pulm CT 08/31/14 3.8 x 2.7 x 2.8 cm soft tissue mass in the medial right upper lobe   accounting for the radiographic abnormality,  new from prior CT. Lesion appears low-density/ necrotic on soft  tissue windows . Associated clustered satellite    nodularity in the right upper lobe. Additional minimal clustered nodularity in the posterior left upper  lobe with an additional 3-4 mm nodule in the lateral left upper lobe . Underlying moderate  paraseptal emphysematous changes with bullous changes at the right  lung apex. Overall, this appearance is most suggestive of infection, possibly  atypical infection such as TB. HIV related malignancies including  Kaposi's sarcoma and lymphoma remain possible.  Thoracic lymphadenopathy, including:  --13 mm short axis high right paratracheal node  --11 mm short axis necrotic right paratracheal nod --10 mm short axis right hilar node   IMPRESSION:  3.8 cm soft tissue mass in the medial right upper lobe with  associated right upper lobe clustered nodularity.  This appearance is most suggestive of infection, possibly atypical  infection such as TB. HIV related malignancies including Kaposi's  sarcoma and lymphoma remain possible. Primary bronchogenic neoplasm  is considered unlikely.  Consider sputum culture and/or bronchoscopy as clinically warranted.  At a minimum, follow-up imaging is suggested to document resolution  following appropriate antimicrobial therapy.   Assessment and Plan  hiv = well controlled. Continue with DLG/truvada daily  oi proph = can stop taking bactrim mid October  Health maintenance = will need flu, hep a, hep b, and pneumonia today  Lung mass = unable to add MAC PCR testing per discussion with lab. Will check for aspergillus, histo,blasto. Will have him come back on 4 wk to follow up on these cultures and decide if need to pursue further diagnostics with mediastinoscopy for tissue bx send for culture. Quick onset favors infectious process  rtc in 4 wk

## 2014-09-28 ENCOUNTER — Other Ambulatory Visit: Payer: No Typology Code available for payment source

## 2014-09-28 ENCOUNTER — Telehealth: Payer: Self-pay | Admitting: *Deleted

## 2014-09-28 LAB — ASPERGILLUS ANTIGEN,SERUM

## 2014-09-28 NOTE — Telephone Encounter (Signed)
Tonya from [facility] called to advise that the Aspergillus Antigen test we sent 09/26/14 for this patient is unable to be done as is has to be frozen serum. Advised they discarded the sample. Advised the lab and Dr Drue Second and per Dr Drue Second called the patient to have him come in to be redrawn. None of the numbers on his chart are working today. Will try and call him at a later date to get him back in.

## 2014-10-01 ENCOUNTER — Ambulatory Visit (INDEPENDENT_AMBULATORY_CARE_PROVIDER_SITE_OTHER)
Admission: RE | Admit: 2014-10-01 | Discharge: 2014-10-01 | Disposition: A | Discharge status: Home / Self Care | Payer: No Typology Code available for payment source | Source: Ambulatory Visit | Attending: Pulmonary Disease | Admitting: Pulmonary Disease

## 2014-10-01 DIAGNOSIS — J984 Other disorders of lung: Secondary | ICD-10-CM

## 2014-10-01 LAB — HISTOPLASMA ANTIBODIES: Histoplasma Ab, Immunodiffusion: NEGATIVE

## 2014-10-15 LAB — AFB CULTURE WITH SMEAR (NOT AT ARMC): Acid Fast Smear: NONE SEEN

## 2014-10-18 LAB — AFB CULTURE WITH SMEAR (NOT AT ARMC): Acid Fast Smear: NONE SEEN

## 2014-10-24 ENCOUNTER — Other Ambulatory Visit: Payer: Self-pay

## 2014-10-31 ENCOUNTER — Other Ambulatory Visit: Payer: Self-pay | Admitting: Internal Medicine

## 2014-10-31 ENCOUNTER — Ambulatory Visit (INDEPENDENT_AMBULATORY_CARE_PROVIDER_SITE_OTHER): Payer: No Typology Code available for payment source | Admitting: Internal Medicine

## 2014-10-31 ENCOUNTER — Encounter: Payer: Self-pay | Admitting: Internal Medicine

## 2014-10-31 VITALS — BP 122/71 | HR 96 | Temp 98.6°F | Wt 140.0 lb

## 2014-10-31 DIAGNOSIS — R918 Other nonspecific abnormal finding of lung field: Secondary | ICD-10-CM

## 2014-11-02 LAB — ASPERGILLUS ANTIGEN,SERUM
ASPERGILLUS AG, EIA: NOT DETECTED
INDEX VAL: 0.08 (ref <0.50)

## 2014-11-02 LAB — ASPERGILLUS ANTIBODY (COMPLEMENT FIX)

## 2014-11-03 LAB — ASPERGILLUS ANTIBODY BY IMMUNODIFF: Aspergillus Antibody ID: NEGATIVE

## 2014-11-04 NOTE — Progress Notes (Signed)
Patient ID: Rodney Walker, male   DOB: [birth year], 33 y.o.   MRN: [id number]  HPI 33yo M with HIv disease, CD 4 count of 300/VL 29 ( Sep 2015)on tivicay/truvada. No difficulty with his antivirals. found to have lung lesion and bullae in September, he underwent bronch with biopsy and washings but path has been inconclusive to etiology of lung mass. Histo and aspergillus antibodies are negative. quantiferon plus AFB. Patient feeling well.  Outpatient Encounter Prescriptions as of 10/31/2014  Medication Sig  . albuterol (PROVENTIL HFA;VENTOLIN HFA) 108 (90 BASE) MCG/ACT inhaler Inhale 2 puffs into the lungs every 6 (six) hours as needed for wheezing or shortness of breath.  . dolutegravir (TIVICAY) 50 MG tablet Take 1 tablet (50 mg total) by mouth daily.  Marland Kitchen emtricitabine-tenofovir (TRUVADA) 200-300 MG per tablet Take 1 tablet by mouth daily.  . fluticasone (FLONASE) 50 MCG/ACT nasal spray Place 1 spray into both nostrils daily as needed for allergies or rhinitis.  Marland Kitchen ibuprofen (ADVIL,MOTRIN) 800 MG tablet Take 400 mg by mouth every 8 (eight) hours as needed (for pain.).  . [DISCONTINUED] sulfamethoxazole-trimethoprim (BACTRIM DS) 800-160 MG per tablet Take 1 tablet by mouth daily.     Patient Active Problem List   Diagnosis Date Noted  . Lung mass 08/31/2014  . HIV disease 08/31/2014  . 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 Myalgia 04/23/2014  . Pharyngitis 03/31/2014  . Hyponatremia 03/31/2014  . Fever 03/30/2014  . AIDS 03/22/2014  . Thrush 03/22/2014  . CAP (community acquired pneumonia) 03/22/2014  . CAD (coronary artery disease) 11/13/2011     Health Maintenance Due  Topic Date Due  . TETANUS/TDAP  10/18/1999     Review of Systems 10 point ros is negative Physical Exam   BP 122/71 mmHg  Pulse 96  Temp(Src) 98.6 F (37 C) (Oral)  Wt 140 lb (63.504 kg) Physical Exam  Constitutional: He is oriented to person, place, and time. He appears well-developed  and well-nourished. No distress.  HENT:  Mouth/Throat: Oropharynx is clear and moist. No oropharyngeal exudate.  Cardiovascular: Normal rate, regular rhythm and normal heart sounds. Exam reveals no gallop and no friction rub.  No murmur heard.  Pulmonary/Chest: Effort normal and breath sounds normal. No respiratory distress. He has no wheezes.  Abdominal: Soft. Bowel sounds are normal. He exhibits no distension. There is no tenderness.  Lymphadenopathy:  He has no cervical adenopathy.  Neurological: He is alert and oriented to person, place, and time.  Skin: Skin is warm and dry. No rash noted. No erythema.  Psychiatric: He has a normal mood and affect. His behavior is normal.    Lab Results  Component Value Date   CD4TCELL 29* 08/31/2014   Lab Results  Component Value Date   CD4TABS 300* 08/31/2014   CD4TABS 260* 07/09/2014   CD4TABS 120* 04/19/2014   Lab Results  Component Value Date   HIV1RNAQUANT 50* 09/01/2014   No results found for: HEPBSAB No results found for: RPR  CBC Lab Results  Component Value Date   WBC 5.0 09/01/2014   RBC 4.02* 09/01/2014   HGB 12.6* 09/01/2014   HCT 34.8* 09/01/2014   PLT 258 09/01/2014   MCV 86.6 09/01/2014   MCH 31.3 09/01/2014   MCHC 36.2* 09/01/2014   RDW 13.9 09/01/2014   LYMPHSABS 1.0 07/09/2014   MONOABS 0.5 07/09/2014   EOSABS 0.1 07/09/2014   BASOSABS 0.0 07/09/2014   BMET Lab Results  Component Value Date   NA 139 09/01/2014  K 3.8 09/01/2014   CL 103 09/01/2014   CO2 25 09/01/2014   GLUCOSE 98 09/01/2014   BUN 10 09/01/2014   CREATININE 1.19 09/01/2014   CALCIUM 8.7 09/01/2014   GFRNONAA 79* 09/01/2014   GFRAA >90 09/01/2014  path: Path did not show any granulomas or fungal elements, but cytology of brushings, "granulomatous inflammation"  Imaging: 09/05/14 1. Very slight interval decrease in size of the solid right upper lobe mass identified within the central lung. The surrounding more peripheral  clustered nodular opacities appear stable. 2. Numerous left-sided pulmonary nodules, unchanged in the interval. 3. Atypical infection remains a consideration although neoplastic etiology is now more concerning given the lack of interval resolution.  Assessment and Plan hiv = well controlled, continue on current regimen  Pulmonary lesion = will need to reach out to pulmonary to see if can do repeat bronch ? Possibly ebus?sampling. Need repeat sampling. Will need tissue for fungal, mycobacterial, path.   Health maintenance = will need to get hep B #2.

## 2014-11-07 ENCOUNTER — Ambulatory Visit: Payer: Self-pay | Admitting: Infectious Disease

## 2014-11-12 ENCOUNTER — Telehealth: Payer: Self-pay | Admitting: *Deleted

## 2014-11-12 NOTE — Telephone Encounter (Signed)
Patient called asking if he could have a prescription for something to gain weight.  Patient states he feels he should weigh more. Patient is 5'4, weighed 122lb on 4/23 (BMI of 20.9) and has had no problem gaining to 140 on 11/4 (BMI of 24.0).  RN advised patient that he is not considered underweight, it is not our policy to prescribe weight gaining prescription when within normal BMI.   Pt verbalized understanding. Andree Coss, RN

## 2014-12-13 ENCOUNTER — Encounter: Payer: Self-pay | Admitting: Internal Medicine

## 2014-12-13 ENCOUNTER — Ambulatory Visit (HOSPITAL_COMMUNITY)
Admission: RE | Admit: 2014-12-13 | Discharge: 2014-12-13 | Disposition: A | Discharge status: Home / Self Care | Payer: No Typology Code available for payment source | Source: Ambulatory Visit | Attending: Internal Medicine | Admitting: Internal Medicine

## 2014-12-13 ENCOUNTER — Ambulatory Visit (INDEPENDENT_AMBULATORY_CARE_PROVIDER_SITE_OTHER): Payer: No Typology Code available for payment source | Admitting: Internal Medicine

## 2014-12-13 VITALS — BP 110/65 | HR 77 | Temp 98.7°F | Wt 138.0 lb

## 2014-12-13 DIAGNOSIS — J989 Respiratory disorder, unspecified: Secondary | ICD-10-CM | POA: Insufficient documentation

## 2014-12-13 DIAGNOSIS — Z21 Asymptomatic human immunodeficiency virus [HIV] infection status: Secondary | ICD-10-CM

## 2014-12-13 DIAGNOSIS — B9789 Other viral agents as the cause of diseases classified elsewhere: Secondary | ICD-10-CM

## 2014-12-13 DIAGNOSIS — B349 Viral infection, unspecified: Secondary | ICD-10-CM

## 2014-12-13 DIAGNOSIS — Z113 Encounter for screening for infections with a predominantly sexual mode of transmission: Secondary | ICD-10-CM

## 2014-12-13 DIAGNOSIS — J988 Other specified respiratory disorders: Secondary | ICD-10-CM

## 2014-12-13 DIAGNOSIS — J984 Other disorders of lung: Secondary | ICD-10-CM

## 2014-12-13 DIAGNOSIS — B20 Human immunodeficiency virus [HIV] disease: Secondary | ICD-10-CM

## 2014-12-13 LAB — CBC WITH DIFFERENTIAL/PLATELET
BASOS ABS: 0 10*3/uL (ref 0.0–0.1)
BASOS PCT: 0 % (ref 0–1)
EOS ABS: 0.2 10*3/uL (ref 0.0–0.7)
EOS PCT: 3 % (ref 0–5)
HCT: 39.5 % (ref 39.0–52.0)
HEMOGLOBIN: 13.8 g/dL (ref 13.0–17.0)
LYMPHS ABS: 1.1 10*3/uL (ref 0.7–4.0)
Lymphocytes Relative: 21 % (ref 12–46)
MCH: 31.9 pg (ref 26.0–34.0)
MCHC: 34.9 g/dL (ref 30.0–36.0)
MCV: 91.4 fL (ref 78.0–100.0)
MPV: 9.8 fL (ref 9.4–12.4)
Monocytes Absolute: 0.5 10*3/uL (ref 0.1–1.0)
Monocytes Relative: 9 % (ref 3–12)
NEUTROS PCT: 67 % (ref 43–77)
Neutro Abs: 3.4 10*3/uL (ref 1.7–7.7)
PLATELETS: 251 10*3/uL (ref 150–400)
RBC: 4.32 MIL/uL (ref 4.22–5.81)
RDW: 13.4 % (ref 11.5–15.5)
WBC: 5 10*3/uL (ref 4.0–10.5)

## 2014-12-13 LAB — COMPLETE METABOLIC PANEL WITH GFR
ALK PHOS: 100 U/L (ref 39–117)
ALT: 8 U/L (ref 0–53)
AST: 18 U/L (ref 0–37)
Albumin: 3.9 g/dL (ref 3.5–5.2)
BUN: 9 mg/dL (ref 6–23)
CO2: 26 mEq/L (ref 19–32)
CREATININE: 1.14 mg/dL (ref 0.50–1.35)
Calcium: 9.5 mg/dL (ref 8.4–10.5)
Chloride: 104 mEq/L (ref 96–112)
GFR, Est Non African American: 83 mL/min
Glucose, Bld: 86 mg/dL (ref 70–99)
Potassium: 3.8 mEq/L (ref 3.5–5.3)
SODIUM: 137 meq/L (ref 135–145)
TOTAL PROTEIN: 7 g/dL (ref 6.0–8.3)
Total Bilirubin: 0.3 mg/dL (ref 0.2–1.2)

## 2014-12-13 LAB — RPR

## 2014-12-14 ENCOUNTER — Telehealth: Payer: Self-pay | Admitting: *Deleted

## 2014-12-14 LAB — HIV-1 RNA QUANT-NO REFLEX-BLD: HIV 1 RNA Quant: <20 copies/mL (ref <20)

## 2014-12-14 LAB — T-HELPER CELL (CD4) - (RCID CLINIC ONLY)
CD4 % Helper T Cell: 27 % — ABNORMAL LOW (ref 33–55)
CD4 T CELL ABS: 280 /uL — AB (ref 400–2700)

## 2014-12-14 NOTE — Telephone Encounter (Signed)
Patient called and advised he was supposed to have a Rx for cough medication called in yesterday 12/13/14. Advised nothing was at the pharmacy and he wants the doctor to send something in for him. Advised him will have to check with the doctor and get back to him.

## 2014-12-14 NOTE — Telephone Encounter (Signed)
Can you call in robitussin- with codiene for me. 75mL Q4-6hr for cough as needed. 63mL bottle

## 2014-12-14 NOTE — Telephone Encounter (Signed)
RX called in .

## 2014-12-17 NOTE — Telephone Encounter (Signed)
Yes, though I thought I had emailed you or travis on this already. Can you check with Feliz Beam

## 2014-12-18 NOTE — Progress Notes (Signed)
Patient ID: Rodney GrillsJonathan Everette Walker, male   DOB: 29-Aug-1981, 33 y.o.   MRN: [id number]       Patient ID: Rodney GrillsJonathan Everette Walker, male   DOB: 29-Aug-1981, 33 y.o.   MRN: [id number]  HPI 33yo M with HIV disease, Cd 4 count of 280/VL< 20 on DLG-truvada with good adherence. HE was found to have right upper lung mass in early September 2015 underwent bronch for tissue and BAL. Cytology and path negative for malignancy, AFB negative x 2. Aspergillus serology was negative. He has recent cough due to uri symptoms of nasal congestion, runny nose and cough but denies fever, no significant dyspnea on exertion.  Outpatient Encounter Prescriptions as of 12/13/2014  Medication Sig  . albuterol (PROVENTIL HFA;VENTOLIN HFA) 108 (90 BASE) MCG/ACT inhaler Inhale 2 puffs into the lungs every 6 (six) hours as needed for wheezing or shortness of breath.  . dolutegravir (TIVICAY) 50 MG tablet Take 1 tablet (50 mg total) by mouth daily.  Marland Kitchen. emtricitabine-tenofovir (TRUVADA) 200-300 MG per tablet Take 1 tablet by mouth daily.  Marland Kitchen. ibuprofen (ADVIL,MOTRIN) 800 MG tablet Take 400 mg by mouth every 8 (eight) hours as needed (for pain.).  Marland Kitchen. fluticasone (FLONASE) 50 MCG/ACT nasal spray Place 1 spray into both nostrils daily as needed for allergies or rhinitis. (Patient not taking: Reported on 12/13/2014)     Patient Active Problem List   Diagnosis Date Noted  . Lung mass 08/31/2014  . HIV disease 08/31/2014  . 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 Myalgia 04/23/2014  . Pharyngitis 03/31/2014  . Hyponatremia 03/31/2014  . Fever 03/30/2014  . AIDS 03/22/2014  . Thrush 03/22/2014  . CAP (community acquired pneumonia) 03/22/2014  . CAD (coronary artery disease) 11/13/2011     Health Maintenance Due  Topic Date Due  . TETANUS/TDAP  10/18/1999     Review of Systems Review of Systems  Constitutional: Negative for fever, chills, diaphoresis, activity change, appetite change, fatigue and unexpected weight change.    HENT: positive for congestion, sore throat, rhinorrhea, sneezing, trouble swallowing and sinus pressure.  Eyes: Negative for photophobia and visual disturbance.  Respiratory: positive for cough, but negative for chest tightness, shortness of breath, wheezing and stridor.  Cardiovascular: Negative for chest pain, palpitations and leg swelling.  Gastrointestinal: Negative for nausea, vomiting, abdominal pain, diarrhea, constipation, blood in stool, abdominal distention and anal bleeding.  Genitourinary: Negative for dysuria, hematuria, flank pain and difficulty urinating.  Musculoskeletal: Negative for myalgias, back pain, joint swelling, arthralgias and gait problem.  Skin: Negative for color change, pallor, rash and wound.  Neurological: Negative for dizziness, tremors, weakness and light-headedness.  Hematological: Negative for adenopathy. Does not bruise/bleed easily.  Psychiatric/Behavioral: Negative for behavioral problems, confusion, sleep disturbance, dysphoric mood, decreased concentration and agitation.   Physical Exam   BP 110/65 mmHg  Pulse 77  Temp(Src) 98.7 F (37.1 C) (Oral)  Wt 138 lb (62.596 kg) Physical Exam  Constitutional: He is oriented to person, place, and time. He appears well-developed and well-nourished. No distress.  HENT:  Mouth/Throat: Oropharynx is clear and moist. No oropharyngeal exudate.  Cardiovascular: Normal rate, regular rhythm and normal heart sounds. Exam reveals no gallop and no friction rub.  No murmur heard.  Pulmonary/Chest: Effort normal and breath sounds normal. No respiratory distress. He has no wheezes.  Abdominal: Soft. Bowel sounds are normal. He exhibits no distension. There is no tenderness.  Lymphadenopathy:  He has no cervical adenopathy.  Neurological: He is alert and oriented to person, place, and time.  Skin: Skin is warm and dry. No rash noted. No erythema.  Psychiatric: He has a normal mood and affect. His behavior is normal.      Lab Results  Component Value Date   CD4TCELL 27* 12/13/2014   Lab Results  Component Value Date   CD4TABS 280* 12/13/2014   CD4TABS 300* 08/31/2014   CD4TABS 260* 07/09/2014   Lab Results  Component Value Date   HIV1RNAQUANT <20 12/13/2014   No results found for: HEPBSAB No results found for: RPR  CBC Lab Results  Component Value Date   WBC 5.0 12/13/2014   RBC 4.32 12/13/2014   HGB 13.8 12/13/2014   HCT 39.5 12/13/2014   PLT 251 12/13/2014   MCV 91.4 12/13/2014   MCH 31.9 12/13/2014   MCHC 34.9 12/13/2014   RDW 13.4 12/13/2014   LYMPHSABS 1.1 12/13/2014   MONOABS 0.5 12/13/2014   EOSABS 0.2 12/13/2014   BASOSABS 0.0 12/13/2014   BMET Lab Results  Component Value Date   NA 137 12/13/2014   K 3.8 12/13/2014   CL 104 12/13/2014   CO2 26 12/13/2014   GLUCOSE 86 12/13/2014   BUN 9 12/13/2014   CREATININE 1.14 12/13/2014   CALCIUM 9.5 12/13/2014   GFRNONAA 83 12/13/2014   GFRAA >89 12/13/2014     Assessment and Plan  Viral upper respiratory tract infection = will give him robitussin-codeine to help with cough at night. Continue with other over the counter medications if needed for supportive care  hiv = will check hiv cd 4 count and viral load. Continue with current regimen  Lung mass = will repeat cxr and refer back to pulmonary to see if need to do repeat bronch for specimen collection to send for fungal culture

## 2015-01-13 ENCOUNTER — Encounter (HOSPITAL_COMMUNITY): Payer: Self-pay

## 2015-01-13 ENCOUNTER — Emergency Department (HOSPITAL_COMMUNITY): Payer: No Typology Code available for payment source

## 2015-01-13 ENCOUNTER — Emergency Department (HOSPITAL_COMMUNITY)
Admission: EM | Admit: 2015-01-13 | Discharge: 2015-01-13 | Disposition: A | Discharge status: Home / Self Care | Payer: Self-pay | Attending: Emergency Medicine | Admitting: Emergency Medicine

## 2015-01-13 ENCOUNTER — Emergency Department (HOSPITAL_COMMUNITY): Payer: Self-pay

## 2015-01-13 DIAGNOSIS — Z720 Tobacco use: Secondary | ICD-10-CM | POA: Insufficient documentation

## 2015-01-13 DIAGNOSIS — R918 Other nonspecific abnormal finding of lung field: Secondary | ICD-10-CM

## 2015-01-13 DIAGNOSIS — I252 Old myocardial infarction: Secondary | ICD-10-CM | POA: Insufficient documentation

## 2015-01-13 DIAGNOSIS — R112 Nausea with vomiting, unspecified: Secondary | ICD-10-CM | POA: Insufficient documentation

## 2015-01-13 DIAGNOSIS — R058 Other specified cough: Secondary | ICD-10-CM

## 2015-01-13 DIAGNOSIS — I2510 Atherosclerotic heart disease of native coronary artery without angina pectoris: Secondary | ICD-10-CM | POA: Insufficient documentation

## 2015-01-13 DIAGNOSIS — Z79899 Other long term (current) drug therapy: Secondary | ICD-10-CM | POA: Insufficient documentation

## 2015-01-13 DIAGNOSIS — R079 Chest pain, unspecified: Secondary | ICD-10-CM

## 2015-01-13 DIAGNOSIS — R05 Cough: Secondary | ICD-10-CM

## 2015-01-13 DIAGNOSIS — Z21 Asymptomatic human immunodeficiency virus [HIV] infection status: Secondary | ICD-10-CM | POA: Insufficient documentation

## 2015-01-13 DIAGNOSIS — R630 Anorexia: Secondary | ICD-10-CM | POA: Insufficient documentation

## 2015-01-13 DIAGNOSIS — R1030 Lower abdominal pain, unspecified: Secondary | ICD-10-CM | POA: Insufficient documentation

## 2015-01-13 DIAGNOSIS — Z8639 Personal history of other endocrine, nutritional and metabolic disease: Secondary | ICD-10-CM | POA: Insufficient documentation

## 2015-01-13 DIAGNOSIS — R109 Unspecified abdominal pain: Secondary | ICD-10-CM

## 2015-01-13 LAB — URINALYSIS, ROUTINE W REFLEX MICROSCOPIC
GLUCOSE, UA: NEGATIVE mg/dL
Ketones, ur: 15 mg/dL — AB
Leukocytes, UA: NEGATIVE
Nitrite: NEGATIVE
PH: 6 (ref 5.0–8.0)
Specific Gravity, Urine: 1.034 — ABNORMAL HIGH (ref 1.005–1.030)
Urobilinogen, UA: 1 mg/dL (ref 0.0–1.0)

## 2015-01-13 LAB — CBC
HCT: 36.4 % — ABNORMAL LOW (ref 39.0–52.0)
Hemoglobin: 13.5 g/dL (ref 13.0–17.0)
MCH: 31.8 pg (ref 26.0–34.0)
MCHC: 37.1 g/dL — AB (ref 30.0–36.0)
MCV: 85.8 fL (ref 78.0–100.0)
PLATELETS: 248 10*3/uL (ref 150–400)
RBC: 4.24 MIL/uL (ref 4.22–5.81)
RDW: 13.1 % (ref 11.5–15.5)
WBC: 7.6 10*3/uL (ref 4.0–10.5)

## 2015-01-13 LAB — BASIC METABOLIC PANEL
Anion gap: 9 (ref 5–15)
BUN: 8 mg/dL (ref 6–23)
CALCIUM: 9.1 mg/dL (ref 8.4–10.5)
CO2: 27 mmol/L (ref 19–32)
Chloride: 102 mEq/L (ref 96–112)
Creatinine, Ser: 1.08 mg/dL (ref 0.50–1.35)
GFR calc Af Amer: >90 mL/min (ref >90)
GFR calc non Af Amer: 88 mL/min — ABNORMAL LOW (ref >90)
GLUCOSE: 97 mg/dL (ref 70–99)
Potassium: 3 mmol/L — ABNORMAL LOW (ref 3.5–5.1)
SODIUM: 138 mmol/L (ref 135–145)

## 2015-01-13 LAB — I-STAT TROPONIN, ED: TROPONIN I, POC: 0 ng/mL (ref 0.00–0.08)

## 2015-01-13 LAB — URINE MICROSCOPIC-ADD ON

## 2015-01-13 MED ORDER — HYDROCODONE-ACETAMINOPHEN 5-325 MG PO TABS: 1.0000 | ORAL_TABLET | Freq: Four times a day (QID) | ORAL | Status: DC | PRN

## 2015-01-13 MED ORDER — HYDROCODONE-ACETAMINOPHEN 5-325 MG PO TABS
1.0000 | ORAL_TABLET | Freq: Once | ORAL | Status: AC
  Administered 2015-01-13: 1 via ORAL
  Filled 2015-01-13: qty 1

## 2015-01-13 NOTE — ED Notes (Signed)
Pt. Reports chest pain x2 weeks, pt states it is caused by a cough, green sputum production. Saw PCP for it and prescribed cough medication. Also reporting abdominal pain x3 days with N/V. Hx of MI.

## 2015-01-13 NOTE — ED Provider Notes (Addendum)
CSN: [id number]     Arrival date & time 01/13/15  1551 History   First MD Initiated Contact with Patient 01/13/15 1720     Chief Complaint  Patient presents with  . Abdominal Pain  . Chest Pain     (Consider location/radiation/quality/duration/timing/severity/associated sxs/prior Treatment) Patient is a 34 y.o. male presenting with abdominal pain and chest pain. The history is provided by the patient.  Abdominal Pain Pain location:  Suprapubic Pain quality: aching and cramping   Pain radiates to:  Does not radiate Pain severity:  Moderate Onset quality:  Gradual Duration:  3 days Timing:  Intermittent Progression:  Waxing and waning Chronicity:  New Context: not alcohol use, not eating, not recent sexual activity, not sick contacts and not suspicious food intake   Relieved by:  None tried Worsened by:  Urination Ineffective treatments:  None tried Associated symptoms: anorexia, chest pain, cough, dysuria, nausea and vomiting   Associated symptoms: no chills, no constipation and no diarrhea   Associated symptoms comment:  Intermittent vomiting 2 episodes in the last 3 days Cough:    Cough characteristics:  Productive   Sputum characteristics:  Green   Severity:  Moderate   Onset quality:  Gradual   Chronicity:  Recurrent Risk factors comment:  HIV on therapy Chest Pain Associated symptoms: abdominal pain, anorexia, cough, nausea and vomiting     Past Medical History  Diagnosis Date  . Chest pain 2010  . Hyperlipidemia   . Coronary artery disease     SPONTANEOUS DISSECTION AND PLAQUE RUPTURE OF HIS LEFT ANTERIOR DESCENDING ARTERY  . MI (myocardial infarction)   . HIV infection    Past Surgical History  Procedure Laterality Date  . Cardiac catheterization  06/24/2009    EF 60%  . Cardiac catheterization  06/17/2009    EF 45%. ANTERIOR HYPOKINESIS  . US echocardiography  12/09/2009    EF 55-60%  . Video bronchoscopy Bilateral 09/05/2014    Procedure: VIDEO  BRONCHOSCOPY WITH FLUORO;  Surgeon: Merwyn Katos, MD;  Location: [facility] ENDOSCOPY;  Service: Cardiopulmonary;  Laterality: Bilateral;   Family History  Problem Relation Age of Onset  . Diabetes Mother   . Liver cancer Father    History  Substance Use Topics  . Smoking status: Current Some Day Smoker -- 0.25 packs/day for 17 years    Types: Cigarettes    Last Attempt to Quit: 03/22/2014  . Smokeless tobacco: Never Used     Comment: has cut back significantly  . Alcohol Use: No    Review of Systems  Constitutional: Negative for chills.  Respiratory: Positive for cough.   Cardiovascular: Positive for chest pain.  Gastrointestinal: Positive for nausea, vomiting, abdominal pain and anorexia. Negative for diarrhea and constipation.  Genitourinary: Positive for dysuria.  All other systems reviewed and are negative.     Allergies  Review of patient's allergies indicates no known allergies.  Home Medications   Prior to Admission medications   Medication Sig Start Date End Date Taking? Authorizing Provider  albuterol (PROVENTIL HFA;VENTOLIN HFA) 108 (90 BASE) MCG/ACT inhaler Inhale 2 puffs into the lungs every 6 (six) hours as needed for wheezing or shortness of breath. 07/25/14  Yes Randall Hiss, MD  dolutegravir (TIVICAY) 50 MG tablet Take 1 tablet (50 mg total) by mouth daily. 09/04/14  Yes Gardiner Barefoot, MD  emtricitabine-tenofovir (TRUVADA) 200-300 MG per tablet Take 1 tablet by mouth daily. 09/04/14  Yes Gardiner Barefoot, MD  fluticasone (FLONASE) 50 MCG/ACT  nasal spray Place 1 spray into both nostrils daily as needed for allergies or rhinitis. 09/06/14  Yes Ripudeep Jenna Luo, MD  guaiFENesin-codeine (ROBITUSSIN AC) 100-10 MG/5ML syrup Take 5 mLs by mouth 2 (two) times daily as needed for cough.   Yes Historical Provider, MD  ibuprofen (ADVIL,MOTRIN) 200 MG tablet Take 200 mg by mouth every 6 (six) hours as needed (pain).   Yes Historical Provider, MD   BP 117/73 mmHg  Pulse 67   Temp(Src) 98.2 F (36.8 C) (Oral)  Resp 12  SpO2 99% Physical Exam  Constitutional: He is oriented to person, place, and time. He appears well-developed and well-nourished. No distress.  HENT:  Head: Normocephalic and atraumatic.  Mouth/Throat: Oropharynx is clear and moist.  Eyes: Conjunctivae and EOM are normal. Pupils are equal, round, and reactive to light.  Neck: Normal range of motion. Neck supple.  Cardiovascular: Normal rate, regular rhythm and intact distal pulses.   No murmur heard. Pulmonary/Chest: Effort normal and breath sounds normal. No respiratory distress. He has no wheezes. He has no rales. He exhibits tenderness.  Abdominal: Soft. He exhibits no distension. There is tenderness in the suprapubic area. There is no rebound, no guarding and no CVA tenderness.    Musculoskeletal: Normal range of motion. He exhibits no edema or tenderness.  Neurological: He is alert and oriented to person, place, and time.  Skin: Skin is warm and dry. No rash noted. No erythema.  Psychiatric: He has a normal mood and affect. His behavior is normal.  Nursing note and vitals reviewed.   ED Course  Procedures (including critical care time) Labs Review Labs Reviewed  CBC - Abnormal; Notable for the following:    HCT 36.4 (*)    MCHC 37.1 (*)    All other components within normal limits  BASIC METABOLIC PANEL - Abnormal; Notable for the following:    Potassium 3.0 (*)    GFR calc non Af Amer 88 (*)    All other components within normal limits  URINALYSIS, ROUTINE W REFLEX MICROSCOPIC  I-STAT TROPOININ, ED    Imaging Review Dg Chest 2 View  01/13/2015   CLINICAL DATA:  Chest pain, productive cough for 2-3 weeks.  COPD.  EXAM: CHEST  2 VIEW  COMPARISON:  12/13/2014  FINDINGS: Irregular mass again noted in the right upper lobe, similar to prior study. Bullous changes in the apices, right greater than left. No acute airspace opacities. Heart is normal size. No effusions or acute bony  abnormality.  IMPRESSION: Stable bullous emphysema.  Stable masslike density in the right upper lobe.  No acute findings.   Electronically Signed   By: Charlett Nose M.D.   On: 01/13/2015 16:52     EKG Interpretation   Date/Time:  Sunday January 13 2015 15:56:03 EST Ventricular Rate:  76 PR Interval:  148 QRS Duration: 74 QT Interval:  370 QTC Calculation: 416 R Axis:   73 Text Interpretation:  Normal sinus rhythm Normal ECG No significant change  since last tracing Confirmed by Anitra Lauth  MD, Alphonzo Lemmings ([id number]) on 01/13/2015  5:51:38 PM      MDM   Final diagnoses:  Abdominal pain  Lung mass    Patient here with 2 complaints. Initially stating chest pain and persistent cough but now with slight sputum production. He has a known right upper lobe lung mass without cause. His TB cultures, aspergillosis cultures and BAL were all negative. He is currently being followed by ID and getting follow-up x-rays. He denies  any fever or shortness of breath. He is not currently taking any antibiotics.  He is still taking his HIV meds and his last CD4 count was 280 and viral load is undetectable. X-ray today shows no change in lung mass as white count is within normal limits and feel that this is stable. EKG and cardiac workup are within normal limits. Pain patient's describing is with coughing and seems pleuritic in nature.  Secondly patient for the last 3-4 days has had lower abdominal pain that is in the suprapubic region as well as some pressure and urgency with urination. He denies any penile discharge, change in sexual partners and wears condoms routinely. He has no symptoms on exam concerning for appendicitis, diverticulitis, pancreatitis or cholecystitis.  CBC, CMP are within normal limits. UA pending. Patient's vital signs are within normal limits and he is in no distress at this time.  8:03 PM UA without findings concerning for infection. Patient's pain is improved after pain medicine. At this  time the patient has only minimal abdominal pain with no peritoneal signs and do not feel that he needs a CT. He will call his infectious disease doctors tomorrow to let them know he is having a productive cough however chest x-ray has not changed do not feel he needs antibiotics at this time  Gwyneth Sprout, MD 01/13/15 2005  Gwyneth Sprout, MD 01/13/15 2007

## 2015-01-13 NOTE — Discharge Instructions (Signed)

## 2015-01-13 NOTE — ED Notes (Signed)
MD at bedside. 

## 2015-01-23 ENCOUNTER — Ambulatory Visit (INDEPENDENT_AMBULATORY_CARE_PROVIDER_SITE_OTHER): Payer: No Typology Code available for payment source | Admitting: Infectious Diseases

## 2015-01-23 ENCOUNTER — Ambulatory Visit: Payer: No Typology Code available for payment source | Admitting: Infectious Disease

## 2015-01-23 ENCOUNTER — Encounter: Payer: Self-pay | Admitting: Infectious Diseases

## 2015-01-23 ENCOUNTER — Telehealth: Payer: Self-pay | Admitting: *Deleted

## 2015-01-23 VITALS — BP 116/70 | HR 82 | Temp 98.3°F | Wt 134.0 lb

## 2015-01-23 DIAGNOSIS — B20 Human immunodeficiency virus [HIV] disease: Secondary | ICD-10-CM

## 2015-01-23 DIAGNOSIS — R918 Other nonspecific abnormal finding of lung field: Secondary | ICD-10-CM

## 2015-01-23 DIAGNOSIS — Z23 Encounter for immunization: Secondary | ICD-10-CM

## 2015-01-23 NOTE — Progress Notes (Signed)
   Subjective:    Patient ID: Rodney Walker, male    DOB: 08-22-1981, 34 y.o.   MRN: [id number]  HPI  34 yo M newly diagnosed HIV/AIDS (March 2015). He was started on ISN/TRV March 2015 and then admitted to hospital (4-3 to 4-7) with ARF and possible PCP. He was started on bactrim and prednisone, continue on his current ART. His ARF resolved with hydration. He was again admitted 08-2014 with a RUL mass. He underwent Bronch which was non-diagnostic (1/2 cytology granulomatous inflammation). He has had afb (-)  x2, aspergillus (-) x2. Was seen in Ed last week for GI distress. Was given vicodin for pain (did not fill). This has resolved.   CXR (01-13-15): Stable bullous emphysema. Stable masslike density in the right upper lobe.  Waiting on orange card for repeat Pulm eval.   HIV 1 RNA QUANT (copies/mL)  Date Value  12/13/2014 <20  09/01/2014 50*  07/09/2014 26*   CD4 T CELL ABS (/uL)  Date Value  12/13/2014 280*  08/31/2014 300*  07/09/2014 260*   Wt has been dropping due to recent GI distress and loss of appetite.  Review of Systems  Constitutional: Positive for unexpected weight change. Negative for appetite change.  Respiratory: Positive for cough.   Gastrointestinal: Negative for diarrhea and constipation.  Genitourinary: Negative for difficulty urinating.      Objective:   Physical Exam  Constitutional: He appears well-developed and well-nourished.  HENT:  Mouth/Throat: No oropharyngeal exudate.  Eyes: EOM are normal. Pupils are equal, round, and reactive to light.  Neck: Neck supple.  Cardiovascular: Normal rate, regular rhythm and normal heart sounds.   Pulmonary/Chest: Effort normal and breath sounds normal.  Abdominal: Soft. Bowel sounds are normal. He exhibits no distension. There is no tenderness.  Lymphadenopathy:    He has no cervical adenopathy.          Assessment & Plan:

## 2015-01-23 NOTE — Assessment & Plan Note (Signed)
His CXR shows this is stable. Will have him seen by pulmonary now that he has orange card.  Greatly appreciate their f/u.

## 2015-01-23 NOTE — Addendum Note (Signed)
Addended by: Wendall Mola A on: 01/23/2015 11:53 AM   Modules accepted: Orders

## 2015-01-23 NOTE — Telephone Encounter (Signed)
Patient notified of appointment with [facility] for 02/11/15 at 9:15 AM. His [facility] discount card is active. Rodney Walker

## 2015-01-23 NOTE — Assessment & Plan Note (Signed)
He's doing well on his current ART.  Gets hep B #2 today.  Is given condoms.  Will see him back in 6 months.

## 2015-02-11 ENCOUNTER — Ambulatory Visit: Payer: No Typology Code available for payment source | Admitting: Pulmonary Disease

## 2015-03-14 ENCOUNTER — Encounter: Payer: Self-pay | Admitting: Internal Medicine

## 2015-03-14 ENCOUNTER — Ambulatory Visit (INDEPENDENT_AMBULATORY_CARE_PROVIDER_SITE_OTHER): Payer: No Typology Code available for payment source | Admitting: Internal Medicine

## 2015-03-14 ENCOUNTER — Other Ambulatory Visit: Payer: Self-pay | Admitting: Internal Medicine

## 2015-03-14 VITALS — BP 128/72 | HR 76 | Temp 97.2°F | Wt 135.0 lb

## 2015-03-14 DIAGNOSIS — B20 Human immunodeficiency virus [HIV] disease: Secondary | ICD-10-CM

## 2015-03-14 DIAGNOSIS — Z23 Encounter for immunization: Secondary | ICD-10-CM

## 2015-03-14 DIAGNOSIS — Z113 Encounter for screening for infections with a predominantly sexual mode of transmission: Secondary | ICD-10-CM

## 2015-03-14 DIAGNOSIS — R634 Abnormal weight loss: Secondary | ICD-10-CM

## 2015-03-14 LAB — COMPLETE METABOLIC PANEL WITH GFR
ALT: 11 U/L (ref 0–53)
AST: 16 U/L (ref 0–37)
Albumin: 3.8 g/dL (ref 3.5–5.2)
Alkaline Phosphatase: 102 U/L (ref 39–117)
BUN: 14 mg/dL (ref 6–23)
CALCIUM: 9.3 mg/dL (ref 8.4–10.5)
CO2: 24 meq/L (ref 19–32)
CREATININE: 1.16 mg/dL (ref 0.50–1.35)
Chloride: 105 mEq/L (ref 96–112)
GFR, Est African American: >89 mL/min
GFR, Est Non African American: 82 mL/min
Glucose, Bld: 79 mg/dL (ref 70–99)
Potassium: 4 mEq/L (ref 3.5–5.3)
SODIUM: 137 meq/L (ref 135–145)
Total Bilirubin: 0.2 mg/dL (ref 0.2–1.2)
Total Protein: 6.6 g/dL (ref 6.0–8.3)

## 2015-03-14 LAB — CBC WITH DIFFERENTIAL/PLATELET
BASOS ABS: 0 10*3/uL (ref 0.0–0.1)
Basophils Relative: 0 % (ref 0–1)
EOS ABS: 0.1 10*3/uL (ref 0.0–0.7)
EOS PCT: 2 % (ref 0–5)
HCT: 39.9 % (ref 39.0–52.0)
Hemoglobin: 13.8 g/dL (ref 13.0–17.0)
Lymphocytes Relative: 17 % (ref 12–46)
Lymphs Abs: 1 10*3/uL (ref 0.7–4.0)
MCH: 31.9 pg (ref 26.0–34.0)
MCHC: 34.6 g/dL (ref 30.0–36.0)
MCV: 92.1 fL (ref 78.0–100.0)
MONO ABS: 0.4 10*3/uL (ref 0.1–1.0)
MPV: 9.7 fL (ref 8.6–12.4)
Monocytes Relative: 7 % (ref 3–12)
Neutro Abs: 4.4 10*3/uL (ref 1.7–7.7)
Neutrophils Relative %: 74 % (ref 43–77)
PLATELETS: 270 10*3/uL (ref 150–400)
RBC: 4.33 MIL/uL (ref 4.22–5.81)
RDW: 14.7 % (ref 11.5–15.5)
WBC: 6 10*3/uL (ref 4.0–10.5)

## 2015-03-14 LAB — RPR

## 2015-03-14 MED ORDER — ENSURE PO LIQD: 237.0000 mL | Freq: Two times a day (BID) | ORAL | Status: DC

## 2015-03-14 NOTE — Progress Notes (Signed)
Patient ID: Rodney Walker, male   DOB: 1981-08-19, 34 y.o.   MRN: [id number]       Patient ID: Rodney Walker, male   DOB: 02-May-1981, 34 y.o.   MRN: [id number]  HPI 33yo M with HIV disease, dx in 02/2014, CD 4 count of 280(27%)/VL<20 on DLG-truvada. He was treated last fall for PCP pneumonia, but also found to have bullous emphysema c/b RUL mass. Biopsy showed granulomatous inflammation, but cx were negative for AFB, fungal. He has had recent cxr that shows stable RUL mass. He has not been able to get back to his pulmonary appointment for follow-up, likely need repeat bronch for further tissue sent for path and cx.   He was last seen at the end of January to ED follow up. Has been doing well except for cough in the last 2 wk, which is improving. It does however sound like he has a chronic cough occ. Sputum production. 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 He has noticed 3 lb weight loss, unintentional since last visit, but reports usually only eating 1 meal per day plus snacks  Outpatient Encounter Prescriptions as of 03/14/2015  Medication Sig  . albuterol (PROVENTIL HFA;VENTOLIN HFA) 108 (90 BASE) MCG/ACT inhaler Inhale 2 puffs into the lungs every 6 (six) hours as needed for wheezing or shortness of breath. (Patient not taking: Reported on 01/23/2015)  . dolutegravir (TIVICAY) 50 MG tablet Take 1 tablet (50 mg total) by mouth daily.  Marland Kitchen emtricitabine-tenofovir (TRUVADA) 200-300 MG per tablet Take 1 tablet by mouth daily.  Marland Kitchen HYDROcodone-acetaminophen (NORCO/VICODIN) 5-325 MG per tablet Take 1 tablet by mouth every 6 (six) hours as needed. (Patient not taking: Reported on 01/23/2015)  . ibuprofen (ADVIL,MOTRIN) 200 MG tablet Take 200 mg by mouth every 6 (six) hours as needed (pain).     Patient Active Problem List   Diagnosis Date Noted  . Lung mass 08/31/2014  . 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 Myalgia 04/23/2014  . Pharyngitis 03/31/2014  . Hyponatremia 03/31/2014  . Fever 03/30/2014  . AIDS 03/22/2014  . Thrush 03/22/2014  . CAP (community acquired pneumonia) 03/22/2014  . CAD (coronary artery disease) 11/13/2011     Health Maintenance Due  Topic Date Due  . TETANUS/TDAP  10/18/1999     Review of Systems  Constitutional: Negative for fever, chills, diaphoresis, activity change, appetite change, fatigue and unexpected weight change.  HENT: Negative for congestion, sore throat, rhinorrhea, sneezing, trouble swallowing and sinus pressure.  Eyes: Negative for photophobia and visual disturbance.  Respiratory: Negative for cough, chest tightness, shortness of breath, wheezing and stridor.  Cardiovascular: Negative for chest pain, palpitations and leg swelling.  Gastrointestinal: Negative for nausea, vomiting, abdominal pain, diarrhea, constipation, blood in stool, abdominal distention and anal bleeding.  Genitourinary: Negative for dysuria, hematuria, flank pain and difficulty urinating.  Musculoskeletal: Negative for myalgias, back pain, joint swelling, arthralgias and gait problem.  Skin: Negative for color change, pallor, rash and wound.  Neurological: Negative for dizziness, tremors, weakness and light-headedness.  Hematological: Negative for adenopathy. Does not bruise/bleed easily.  Psychiatric/Behavioral: Negative for behavioral problems, confusion, sleep disturbance, dysphoric mood, decreased concentration and agitation.    Physical Exam   BP 128/72 mmHg  Pulse 76  Temp(Src) 97.2 F (36.2 C) (Oral)  Wt 135 lb (61.236 kg) Physical Exam  Constitutional: He is oriented to person, place, and time. He appears well-developed and well-nourished. No  distress.  HENT:  Mouth/Throat: Oropharynx is clear and moist. No oropharyngeal exudate.  Cardiovascular: Normal rate, regular rhythm and normal heart sounds. Exam reveals no gallop and no friction rub.  No  murmur heard.  Pulmonary/Chest: Effort normal and breath sounds normal. No respiratory distress. He has no wheezes.  Abdominal: Soft. Bowel sounds are normal. He exhibits no distension. There is no tenderness.  Lymphadenopathy:  He has no cervical adenopathy.  Neurological: He is alert and oriented to person, place, and time.  Skin: Skin is warm and dry. No rash noted. No erythema.  Psychiatric: He has a normal mood and affect. His behavior is normal.     Lab Results  Component Value Date   CD4TCELL 27* 12/13/2014   Lab Results  Component Value Date   CD4TABS 280* 12/13/2014   CD4TABS 300* 08/31/2014   CD4TABS 260* 07/09/2014   Lab Results  Component Value Date   HIV1RNAQUANT <20 12/13/2014   No results found for: HEPBSAB No results found for: RPR  CBC Lab Results  Component Value Date   WBC 7.6 01/13/2015   RBC 4.24 01/13/2015   HGB 13.5 01/13/2015   HCT 36.4* 01/13/2015   PLT 248 01/13/2015   MCV 85.8 01/13/2015   MCH 31.8 01/13/2015   MCHC 37.1* 01/13/2015   RDW 13.1 01/13/2015   LYMPHSABS 1.1 12/13/2014   MONOABS 0.5 12/13/2014   EOSABS 0.2 12/13/2014   BASOSABS 0.0 12/13/2014   BMET Lab Results  Component Value Date   NA 138 01/13/2015   K 3.0* 01/13/2015   CL 102 01/13/2015   CO2 27 01/13/2015   GLUCOSE 97 01/13/2015   BUN 8 01/13/2015   CREATININE 1.08 01/13/2015   CALCIUM 9.1 01/13/2015   GFRNONAA 88* 01/13/2015   GFRAA >90 01/13/2015   Imaging cxr 1/17:  gular mass again noted in the right upper lobe, similar to prior study. Bullous changes in the apices, right greater than left. No acute airspace opacities. Heart is normal size. No effusions or acute bony abnormality.  IMPRESSION: Stable bullous emphysema.  Stable masslike density in the right upper lobe.   Assessment and Plan  hiv disease = will check labs today  oi proph = has hep B #3 at end of April  RUL mass = has upcoming evaluation with pulmonary on 3/24  Hypokalemia =  if still present on this lab draw, may need to supplement  Weight loss =sounds in part not having not sufficient caloric intake. Will do a trial of ensure through [facility] program in clinic

## 2015-03-15 LAB — T-HELPER CELL (CD4) - (RCID CLINIC ONLY)
CD4 % Helper T Cell: 29 % — ABNORMAL LOW (ref 33–55)
CD4 T Cell Abs: 320 /uL — ABNORMAL LOW (ref 400–2700)

## 2015-03-15 LAB — ASPERGILLUS ANTIGEN,SERUM
Aspergillus Ag, EIA: NOT DETECTED
Index Value: 0.1 (ref <0.50)

## 2015-03-15 LAB — HIV-1 RNA QUANT-NO REFLEX-BLD
HIV 1 RNA QUANT: 81 {copies}/mL — AB (ref <20)
HIV-1 RNA QUANT, LOG: 1.91 {Log} — AB (ref <1.30)

## 2015-03-21 ENCOUNTER — Ambulatory Visit (INDEPENDENT_AMBULATORY_CARE_PROVIDER_SITE_OTHER): Payer: No Typology Code available for payment source | Admitting: Pulmonary Disease

## 2015-03-21 ENCOUNTER — Encounter: Payer: Self-pay | Admitting: Pulmonary Disease

## 2015-03-21 VITALS — BP 124/72 | HR 90 | Ht 64.0 in | Wt 134.0 lb

## 2015-03-21 DIAGNOSIS — R918 Other nonspecific abnormal finding of lung field: Secondary | ICD-10-CM

## 2015-03-21 DIAGNOSIS — Z720 Tobacco use: Secondary | ICD-10-CM

## 2015-03-21 NOTE — Progress Notes (Signed)
Subjective:    Patient ID: Rodney Walker, male    DOB: [birth year], 34 y.o.   MRN: [id number]  Synopsis: Rodney Walker has HIV AIDS and was hospitalized in the fall of 2015 in the setting of a right upper lobe lung mass with mediastinal lymphadenopathy. The mass was visible on bronchoscopy and multiple endobronchial biopsies were taken as were cultures including AFB culture. Biopsy results showed nonspecific inflammatory changes in the AFB culture was negative.  HPI Chief Complaint  Patient presents with  . Follow-up    Pt c/o prod cough with green/yellow mucus X1 week.     Rodney Walker returns to clinic today after he unfortunately did not follow-up after our last visit. He now has financial assistance and is able to return tank fully. He has been compliant with his anti-retroviral therapy since the last visit. He says that he does not have shortness of breath and he continues to run a mile or 2 on a daily basis without difficulty. He does cough up green mucus from time to time. He says his weight still remains low and he has not had any weight gain recently. However, he has not had hemoptysis, fevers, or chills. He continues to smoke a cigarette 1-2 times a week  Past Medical History  Diagnosis Date  . Chest pain 2010  . Hyperlipidemia   . Coronary artery disease     SPONTANEOUS DISSECTION AND PLAQUE RUPTURE OF HIS LEFT ANTERIOR DESCENDING ARTERY  . MI (myocardial infarction)   . 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 Patients who have HIV/AIDS are at increased risk for lung cancer. Further, considering the fact that lymphoma is always in the differential of patient with HIV and lymphadenopathy I believe that he may need a mediastinoscopy for further evaluation if we do not see that the mass and mediastinal lymph nodes are decreasing in size on a repeat CT chest.  Plan: -Repeat CT chest canal, if mediastinal lymph nodes and lung mass have not changed then we will refer to thoracic surgery for a mediastinoscopy with lymph node biopsy -Repeat sputum culture with AFB   Tobacco abuse Counseled again to quit smoking     Updated Medication List Outpatient Encounter Prescriptions as of 03/21/2015  Medication Sig  . albuterol (PROVENTIL HFA;VENTOLIN HFA) 108 (90 BASE) MCG/ACT inhaler Inhale 2 puffs into the lungs every 6 (six) hours as needed for wheezing or shortness of breath.  . dolutegravir (TIVICAY) 50 MG tablet Take 1 tablet (50 mg total) by mouth daily.  Marland Kitchen emtricitabine-tenofovir (  TRUVADA) 200-300 MG per tablet Take 1 tablet by mouth daily.  Marland Kitchen ibuprofen (ADVIL,MOTRIN) 200 MG tablet Take 200 mg by mouth every 6 (six) hours as needed (pain).  . [DISCONTINUED] ENSURE (ENSURE) Take 237 mLs by mouth 2 (two) times daily between meals. (Patient not taking: Reported on 03/21/2015)  . [DISCONTINUED] HYDROcodone-acetaminophen (NORCO/VICODIN) 5-325 MG per tablet Take 1 tablet by mouth every 6 (six) hours as needed. (Patient not taking: Reported on 01/23/2015)

## 2015-03-21 NOTE — Patient Instructions (Addendum)
We will collet another sputum specimen for you We will order a CT chest and call you with the results We will see you back in 6-8 weeks

## 2015-03-21 NOTE — Assessment & Plan Note (Signed)
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 Patients who have HIV/AIDS are at increased risk for lung cancer. Further, considering the fact that lymphoma is always in the differential of patient with HIV and lymphadenopathy I believe that he may need a mediastinoscopy for further evaluation if we do not see that the mass and mediastinal lymph nodes are decreasing in size on a repeat CT chest.  Plan: -Repeat CT chest canal, if mediastinal lymph nodes and lung mass have not changed then we will refer to thoracic surgery for a mediastinoscopy with lymph node biopsy -Repeat sputum culture with AFB

## 2015-03-21 NOTE — Assessment & Plan Note (Signed)
Counseled again to quit smoking

## 2015-03-22 IMAGING — CT CT CHEST W/O CM
2 of 3 series · 15 of 36 positions shown, 18 images · IV contrast (Omnipaque 300)
Comparison: 08/31/2014.

CLINICAL DATA: Subsequent encounter for a 1 month history of right
upper lobe pulmonary mass. HIV positive.

EXAM:
CT CHEST WITHOUT CONTRAST
TECHNIQUE: Multidetector CT imaging of the chest was performed following the
standard protocol without IV contrast..

[Series 2: chest routine with · axial · 0.71mm/px · z∈[+1019,+1289]mm · 12 of 64 slices shown, 15 images]
[im 5/64  mediastinal]
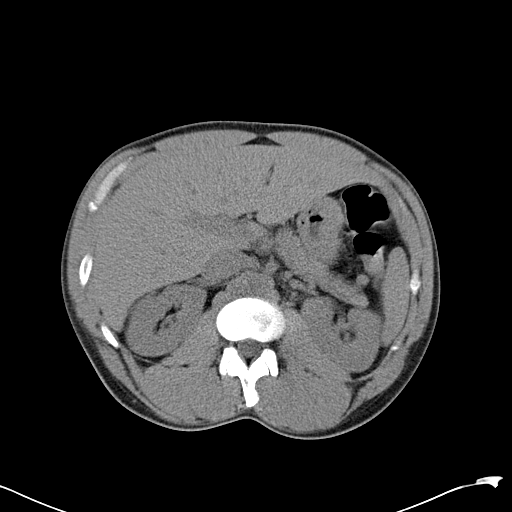
[im 5/64  lung]
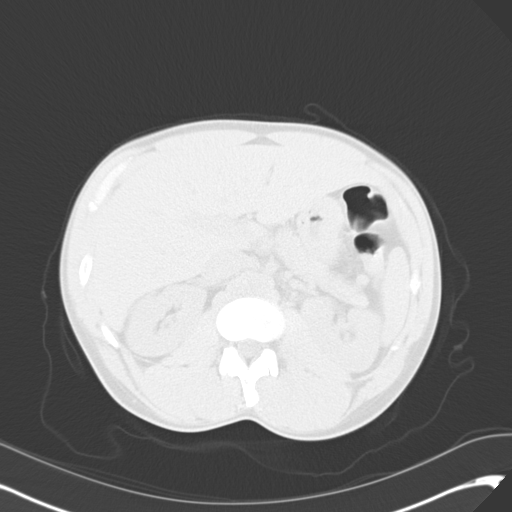
[im 10/64  lung]
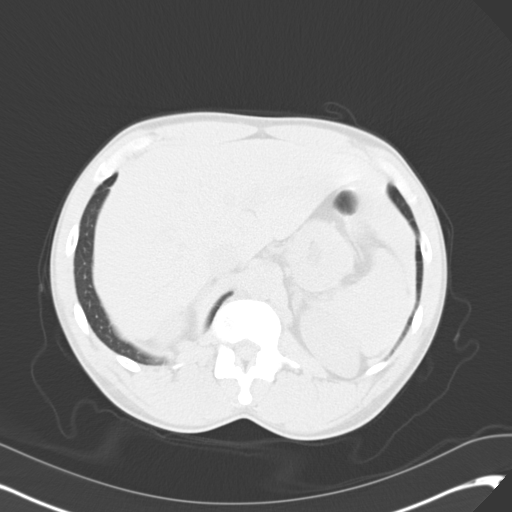
[im 15/64  lung]
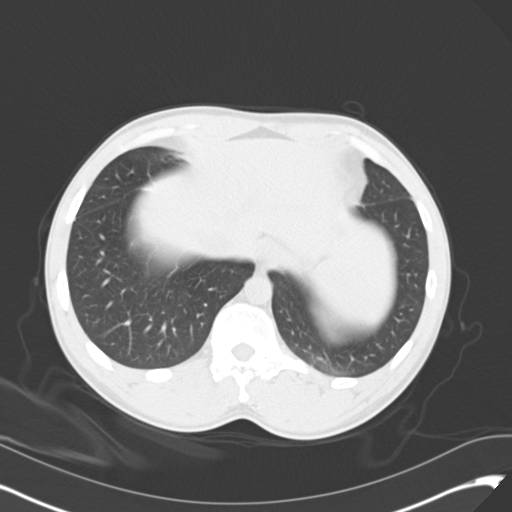
[im 19/64  lung]
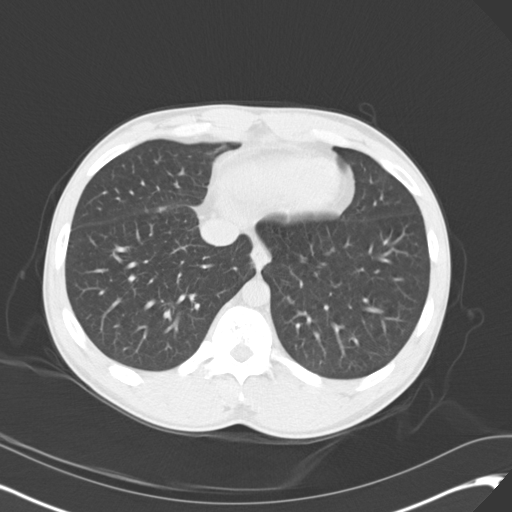
[im 24/64  mediastinal]
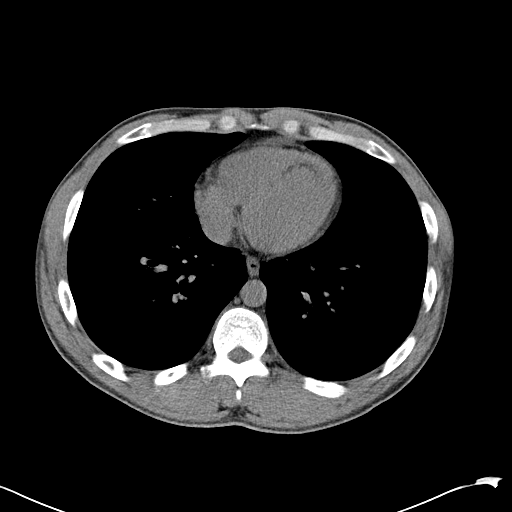
[im 24/64  lung]
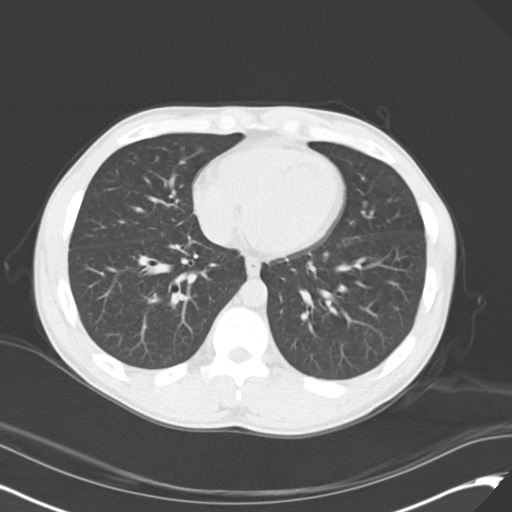
[im 29/64  lung]
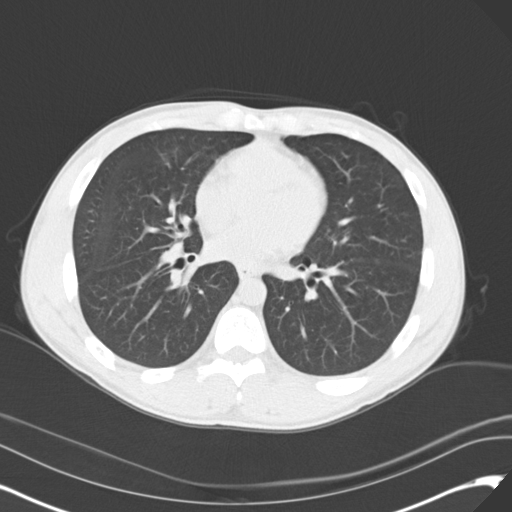
[im 36/64  lung]
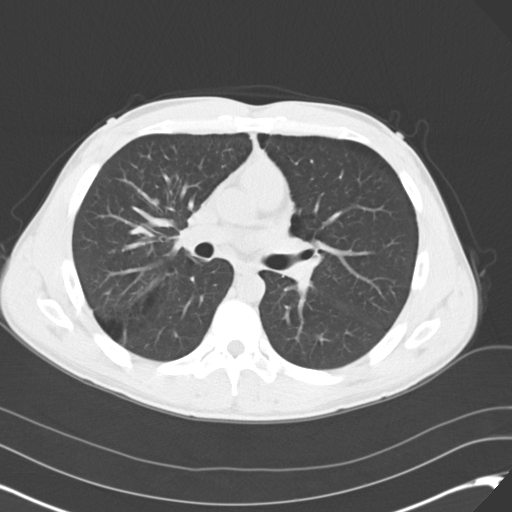
[im 40/64  lung]
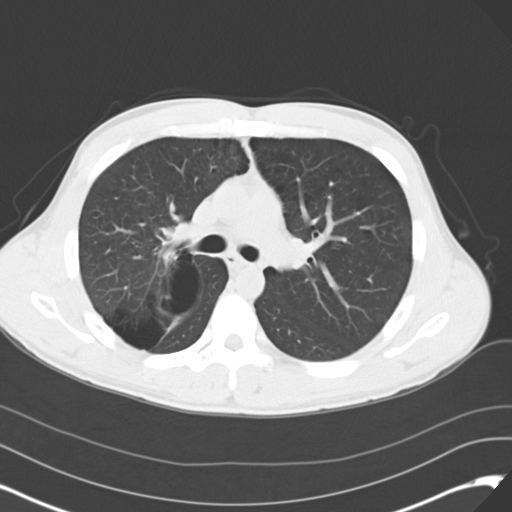
[im 45/64  mediastinal]
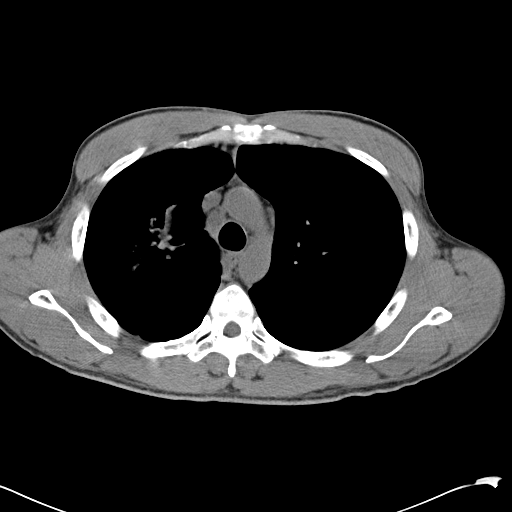
[im 45/64  lung]
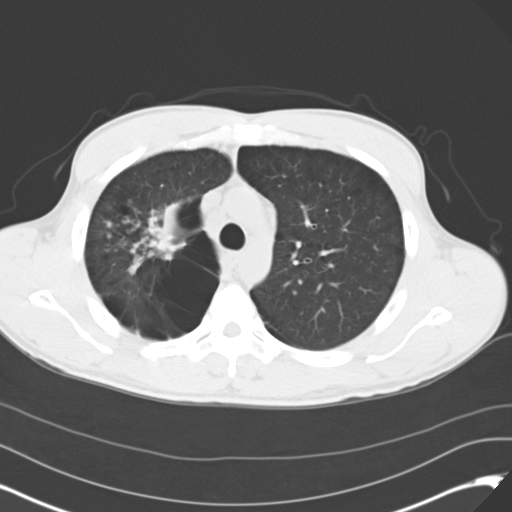
[im 50/64  lung]
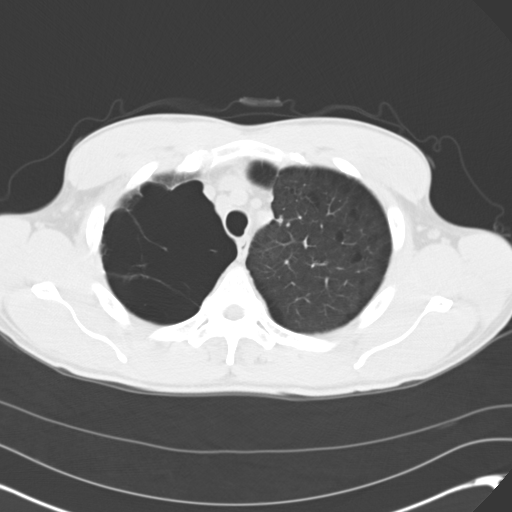
[im 54/64  lung]
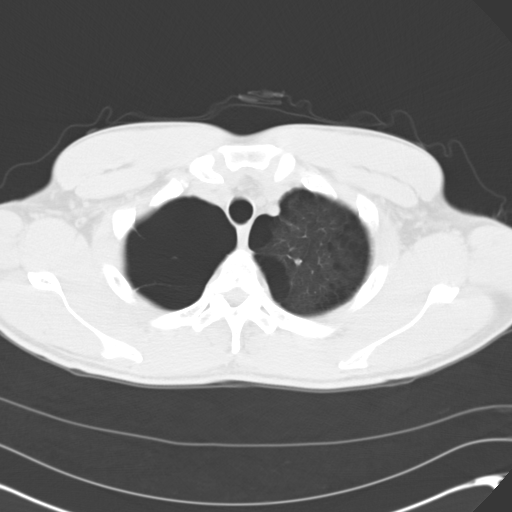
[im 59/64  lung]
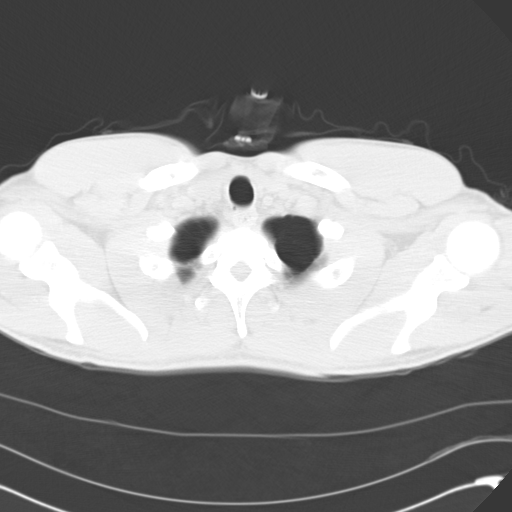

[Series 602: <mpr thick range> · coronal · 0.71mm/px · 3 of 128 slices shown]
[im 26/128  lung]
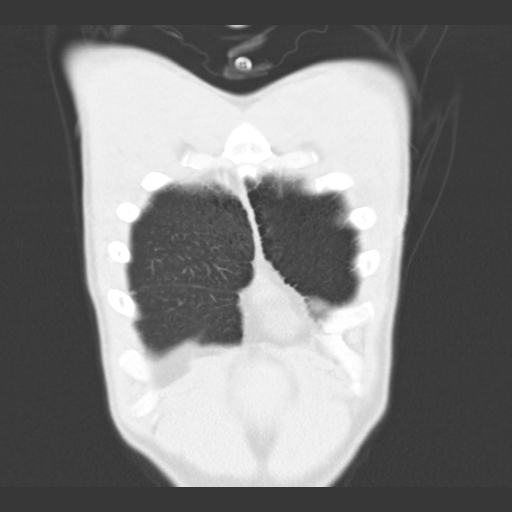
[im 51/128  lung]
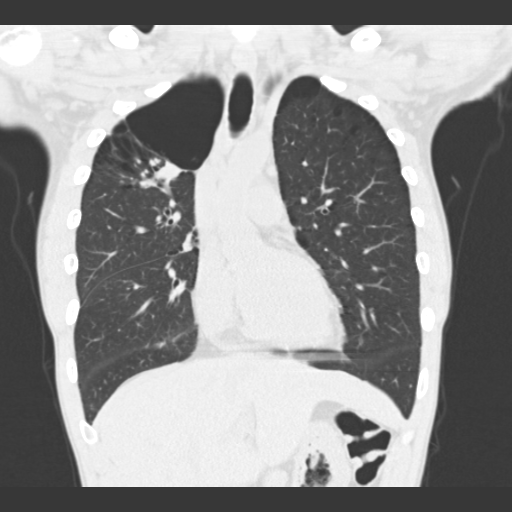
[im 77/128  lung]
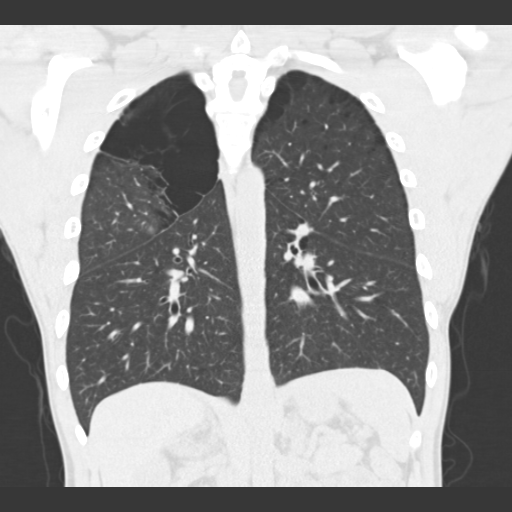

[15 of 36 positions shown; findings below may reference images not displayed]

FINDINGS: Soft tissue / Mediastinum: There is no axillary lymphadenopathy. 13
mm high right paratracheal lymph node identified on the previous
study is stable at 13 mm. Right paratracheal lymph node measured at
11 mm previously is 11 mm on image 20 of series 2 today. No
subcarinal lymphadenopathy. No definite left hilar lymphadenopathy.
The small right hilar lymph node measured previously is not well
seen on today's study without intravenous contrast material.

Heart size is normal. Trace anterior pericardial fluid is evident.
Thoracic esophagus is unremarkable.

Lungs / Pleura: Advanced bullous change in the apices is again
noted. Central right upper lobe nodular masslike opacity measures
3.6 x 2.4 cm today compared to 3.8 x 2.7 cm previously. There is
peripheral satellite nodularity around the dominant masslike lesion.
This peripheral clustered nodularity is not substantially changed in
the interval.

Scattered subcentimeter left-sided lung nodules (images 11, 13, 19,
27, 37, and 45) are stable.

Bones: Bone windows reveal no worrisome lytic or sclerotic osseous
lesions.

Upper Abdomen: Visualized portions of the liver and spleen have
normal on infused features. No adrenal nodule or mass.
IMPRESSION: 1. Very slight interval decrease in size of the solid right upper
lobe mass identified within the central lung. The surrounding more
peripheral clustered nodular opacities appear stable.
2. Numerous left-sided pulmonary nodules, unchanged in the interval.
3. Atypical infection remains a consideration although neoplastic
etiology is now more concerning given the lack of interval
resolution.

## 2015-03-27 ENCOUNTER — Inpatient Hospital Stay: Admission: RE | Admit: 2015-03-27 | Payer: No Typology Code available for payment source | Source: Ambulatory Visit

## 2015-03-28 ENCOUNTER — Ambulatory Visit (INDEPENDENT_AMBULATORY_CARE_PROVIDER_SITE_OTHER)
Admission: RE | Admit: 2015-03-28 | Discharge: 2015-03-28 | Disposition: A | Discharge status: Home / Self Care | Payer: No Typology Code available for payment source | Source: Ambulatory Visit | Attending: Pulmonary Disease | Admitting: Pulmonary Disease

## 2015-03-28 DIAGNOSIS — R918 Other nonspecific abnormal finding of lung field: Secondary | ICD-10-CM

## 2015-04-05 ENCOUNTER — Telehealth: Payer: Self-pay

## 2015-04-05 NOTE — Telephone Encounter (Signed)
-----   Message from Lupita Leash, MD sent at 04/03/2015  5:01 AM EDT ----- A, Please let him know that the CT scan has not changed more.  I'd like to arrange a bronch at [facility] for next Monday. Please remind me to call him to discuss on Friday when I come to clinic. Thanks B

## 2015-04-05 NOTE — Telephone Encounter (Signed)
lmtcb X1 on home # for pt.  Spoke with Dorise Hiss, EC, states he will have pt contact us.    Pt needs a bronch scheduled next week with BQ:  Depending on when we speak to pt and when he is available, we can schedule at the following: Tuesday afternoon at [facility] All day Wednesday or Thursday at [facility] is with fluoro, no tb risk, dx: RUL mass.

## 2015-04-08 NOTE — Telephone Encounter (Signed)
Spoke with pt. States that he can't make the appointment tomorrow. He can do Wednesday or Thursday. Will need to call back in the morning.

## 2015-04-08 NOTE — Telephone Encounter (Signed)
lmtcb x1 for pt. 

## 2015-04-08 NOTE — Telephone Encounter (Signed)
Light breakfast OK

## 2015-04-08 NOTE — Telephone Encounter (Signed)
Bronch scheduled at [facility] tomorrow - 04/09/15 at 1:00pm Patient to arrive at 11:45am for prep Needs Driver  BQ - since the procedure is not until 1pm, what time does patient need to start fasting?  Please advise.

## 2015-04-09 ENCOUNTER — Inpatient Hospital Stay (HOSPITAL_COMMUNITY)
Admission: RE | Admit: 2015-04-09 | Discharge: 2015-04-09 | Disposition: A | Discharge status: Home / Self Care | Payer: No Typology Code available for payment source | Source: Ambulatory Visit

## 2015-04-09 ENCOUNTER — Encounter (HOSPITAL_COMMUNITY): Admission: RE | Payer: Self-pay | Source: Ambulatory Visit

## 2015-04-09 ENCOUNTER — Ambulatory Visit (HOSPITAL_COMMUNITY)
Admission: RE | Admit: 2015-04-09 | Payer: No Typology Code available for payment source | Source: Ambulatory Visit | Admitting: Pulmonary Disease

## 2015-04-09 SURGERY — BRONCHOSCOPY, WITH FLUOROSCOPY
Anesthesia: Moderate Sedation | Laterality: Bilateral

## 2015-04-09 NOTE — Telephone Encounter (Signed)
Left message on Respiratory voicemail to call us back.

## 2015-04-09 NOTE — Telephone Encounter (Signed)
LMTC x 1 for pt - Spoke with Respiratory and rescheduled bronch for Wednesday 04/10/15 at 8:00 at [facility]  (Staff message sent to Dr Kendrick Fries)

## 2015-04-10 ENCOUNTER — Encounter (HOSPITAL_COMMUNITY): Payer: Self-pay | Admitting: Pulmonary Disease

## 2015-04-10 ENCOUNTER — Ambulatory Visit (HOSPITAL_COMMUNITY)
Admission: RE | Admit: 2015-04-10 | Discharge: 2015-04-10 | Disposition: A | Discharge status: Home / Self Care | Payer: Self-pay | Source: Ambulatory Visit | Attending: Pulmonary Disease | Admitting: Pulmonary Disease

## 2015-04-10 ENCOUNTER — Ambulatory Visit (HOSPITAL_COMMUNITY): Payer: No Typology Code available for payment source

## 2015-04-10 ENCOUNTER — Ambulatory Visit (HOSPITAL_COMMUNITY): Payer: Self-pay

## 2015-04-10 ENCOUNTER — Telehealth: Payer: Self-pay | Admitting: Pulmonary Disease

## 2015-04-10 ENCOUNTER — Encounter (HOSPITAL_COMMUNITY)
Admission: RE | Disposition: A | Discharge status: Home / Self Care | Payer: Self-pay | Source: Ambulatory Visit | Attending: Pulmonary Disease

## 2015-04-10 DIAGNOSIS — B20 Human immunodeficiency virus [HIV] disease: Secondary | ICD-10-CM | POA: Insufficient documentation

## 2015-04-10 DIAGNOSIS — I252 Old myocardial infarction: Secondary | ICD-10-CM | POA: Insufficient documentation

## 2015-04-10 DIAGNOSIS — F17210 Nicotine dependence, cigarettes, uncomplicated: Secondary | ICD-10-CM | POA: Insufficient documentation

## 2015-04-10 DIAGNOSIS — Z9889 Other specified postprocedural states: Secondary | ICD-10-CM

## 2015-04-10 DIAGNOSIS — Z21 Asymptomatic human immunodeficiency virus [HIV] infection status: Secondary | ICD-10-CM | POA: Insufficient documentation

## 2015-04-10 DIAGNOSIS — J0410 Acute tracheitis without obstruction: Secondary | ICD-10-CM | POA: Insufficient documentation

## 2015-04-10 DIAGNOSIS — R918 Other nonspecific abnormal finding of lung field: Secondary | ICD-10-CM | POA: Insufficient documentation

## 2015-04-10 DIAGNOSIS — I2510 Atherosclerotic heart disease of native coronary artery without angina pectoris: Secondary | ICD-10-CM | POA: Insufficient documentation

## 2015-04-10 HISTORY — PX: VIDEO BRONCHOSCOPY: SHX5072

## 2015-04-10 SURGERY — BRONCHOSCOPY, WITH FLUOROSCOPY
Anesthesia: Moderate Sedation | Laterality: Bilateral

## 2015-04-10 SURGERY — BRONCHOSCOPY, WITH FLUOROSCOPY
Anesthesia: Moderate Sedation

## 2015-04-10 MED ORDER — MIDAZOLAM HCL 10 MG/2ML IJ SOLN
INTRAMUSCULAR | Status: DC | PRN
  Administered 2015-04-10: 1 mg via INTRAVENOUS
  Administered 2015-04-10 (×2): 2 mg via INTRAVENOUS

## 2015-04-10 MED ORDER — LIDOCAINE HCL (PF) 1 % IJ SOLN
INTRAMUSCULAR | Status: DC | PRN
  Administered 2015-04-10: 6 mL

## 2015-04-10 MED ORDER — FENTANYL CITRATE 0.05 MG/ML IJ SOLN
INTRAMUSCULAR | Status: AC
  Filled 2015-04-10: qty 4

## 2015-04-10 MED ORDER — LIDOCAINE HCL 2 % EX GEL
CUTANEOUS | Status: DC | PRN
  Administered 2015-04-10: 1

## 2015-04-10 MED ORDER — FENTANYL CITRATE 0.05 MG/ML IJ SOLN
INTRAMUSCULAR | Status: DC | PRN
  Administered 2015-04-10: 25 ug via INTRAVENOUS
  Administered 2015-04-10: 100 ug via INTRAVENOUS

## 2015-04-10 MED ORDER — PHENYLEPHRINE HCL 0.25 % NA SOLN
NASAL | Status: DC | PRN
Start: 2015-04-10 — End: 2015-04-10
  Administered 2015-04-10: 2 via NASAL

## 2015-04-10 MED ORDER — MIDAZOLAM HCL 5 MG/ML IJ SOLN
INTRAMUSCULAR | Status: AC
  Filled 2015-04-10: qty 2

## 2015-04-10 NOTE — Op Note (Signed)
PCCM Bronchoscopy Procedure Note  The patient was informed of the risks (including but not limited to bleeding, infection, respiratory failure, lung injury, tooth/oral injury) and benefits of the procedure and gave consent, see chart.  Indication: Right upper lobe mass  Location: [facility]  Condition pre procedure: stable  Medications for procedure: Lidocaine topical solution 1% 12cc, versed 5mg , Fentanyl  Procedure description: The bronchoscope was introduced through the mouth and passed to the bilateral lungs to the level of the subsegmental bronchi throughout the tracheobronchial tree.  Airway exam revealed normal appearing and functioning larynx.  The trachea showed some mild acute inflammation as did the remainder of the tracheobronchial tree.  The carina was sharp.  The left tracheobronchial tree was inspected and was found to be free of endobronchial mass.  The right tracheobronchial tree was inspected and was free of endobronchial mass with the exception of the right upper lobe apical segment.  The orifice was partially obstructed by extrinsic compression and the scope was passed beyond the mass. There was lobular and infiltrating mass seen in the apical segment of the right upper lobe.    Endobronchial brushings and endobronchial biopsies were taken from the mass.  Fluoroscopy was used to guide the biopsies where the mass was able to be visualized.  A BAL was performed at the completion of the procedure.  Procedures performed:  1) Endobronchial brushing RUL x3 2) Endobronchial biopsy RUL x5 3) BAL RUL (80cc instilled, 10cc returned)  Specimens sent:  Cytology: endobronchial brushing AFB culture: BAL Endobronchial biopsy: routine pathology, request for special stains  Condition post procedure: stable  EBL: < 5cc  Complications: None immediate  Patient instructions: We will contact you with the results  Post procedure diagnosis: RUL apical segment  mass  Post procedure the patient will be moved to the recovery area and a CXR will be obtained.  Heber [place], MD [place] PCCM Pager: [phone] Cell: [phone] If no response, call [phone]

## 2015-04-10 NOTE — Progress Notes (Signed)
Video bronchoscopy with intervention washing, intervention brushing, intervention biospy. Report given to endo nurse. Pt. Did well thru out procedure all vitals good.

## 2015-04-10 NOTE — Telephone Encounter (Signed)
-----   Message from Velvet Bathe, [place] sent at 04/10/2015 10:39 AM EDT ----- Elvina Sidle Dr. Kendrick Fries! I left a message on the number listed for [name], but was able to reach [place] on his number.  He states he is with [name] currently driving home from the [place], and he will be with [name] until about 4:00 this afternoon.  If you call [place]'s number he should be able to get [name] for you until that time. Thanks, Morrie Sheldon   ----- Message -----    From: Lupita Leash, MD    Sent: 04/10/2015  10:33 AM      To: Velvet Bathe, CMA  A,  Please contact him to find out when and how I can call he or his friend Kendell Bane to discuss his bronchoscopy.  I tried calling this morning and could not reach [place].    Thanks B

## 2015-04-10 NOTE — H&P (Signed)
  [facility] PCCM  Rodney Walker is here today for his bronchoscopy.  He has a right upper lobe mass, HIV, and a previous bronchoscopy was non-diagnostic.  He is feeling well  Past Medical History  Diagnosis Date  . Chest pain 2010  . Hyperlipidemia   . Coronary artery disease     SPONTANEOUS DISSECTION AND PLAQUE RUPTURE OF HIS LEFT ANTERIOR DESCENDING ARTERY  . MI (myocardial infarction)   . HIV infection      Family History  Problem Relation Age of Onset  . Diabetes Mother   . Liver cancer Father      History   Social History  . Marital Status: Single    Spouse Name: N/A  . Number of Children: N/A  . Years of Education: N/A   Occupational History  . unemployed    Social History Main Topics  . Smoking status: Current Every Day Smoker -- 0.25 packs/day for 17 years    Types: Cigarettes    Last Attempt to Quit: 03/22/2014  . Smokeless tobacco: Never Used     Comment: has cut back significantly, 3 cigs/day.  03/19/15  . Alcohol Use: No  . Drug Use: 4.00 per week    Special: Marijuana     Comment: postive for marijuana. Negative for cocaine  . Sexual Activity:    Partners: Male     Comment: given condoms   Other Topics Concern  . Not on file   Social History Narrative     No Known Allergies   @[email or web address] @  Filed Vitals:   04/10/15 0800 04/10/15 0805 04/10/15 0810 04/10/15 0815  BP: 154/87 125/77 125/71 124/79  Pulse: 71 62 72 62  Temp:      Resp: 16 15 20 13   Height:      Weight:      SpO2: 96% 98% 98% 100%   Gen: well appearing, no acute distress HENT: NCAT, OP clear, neck supple without masses Eyes: PERRL, EOMi Lymph: no cervical lymphadenopathy PULM: CTA B CV: RRR, no mgr, no JVD GI: BS+, soft, nontender, no hsm Derm: no rash or skin breakdown MSK: normal bulk and tone Neuro: A&Ox4, CN II-XII intact, strength 5/5 in all 4 extremities Psyche: normal mood and affect  CT scan images reviewed> right upper lobe mass  IMpression: 1) RUL lung mass 2)  HIV  Plan: Bronchoscopy with BAL, biopsy  Heber Kerens, MD [place] PCCM Pager: [phone] Cell: [phone] If no response, call [phone]

## 2015-04-10 NOTE — Telephone Encounter (Signed)
I was able to speak with Rodney Walker today.  He is doing fine this afternoon.  I explained we will inform him of the results when available.

## 2015-04-12 LAB — CULTURE, RESPIRATORY W GRAM STAIN: Gram Stain: NONE SEEN

## 2015-04-12 LAB — CULTURE, RESPIRATORY

## 2015-04-14 ENCOUNTER — Encounter (HOSPITAL_COMMUNITY): Payer: Self-pay

## 2015-04-14 ENCOUNTER — Emergency Department (HOSPITAL_COMMUNITY): Payer: No Typology Code available for payment source

## 2015-04-14 ENCOUNTER — Emergency Department (HOSPITAL_COMMUNITY)
Admission: EM | Admit: 2015-04-14 | Discharge: 2015-04-14 | Disposition: A | Discharge status: Home / Self Care | Payer: No Typology Code available for payment source | Attending: Emergency Medicine | Admitting: Emergency Medicine

## 2015-04-14 DIAGNOSIS — J40 Bronchitis, not specified as acute or chronic: Secondary | ICD-10-CM | POA: Insufficient documentation

## 2015-04-14 DIAGNOSIS — I252 Old myocardial infarction: Secondary | ICD-10-CM | POA: Insufficient documentation

## 2015-04-14 DIAGNOSIS — Z8619 Personal history of other infectious and parasitic diseases: Secondary | ICD-10-CM | POA: Insufficient documentation

## 2015-04-14 DIAGNOSIS — Z79899 Other long term (current) drug therapy: Secondary | ICD-10-CM | POA: Insufficient documentation

## 2015-04-14 DIAGNOSIS — Z8639 Personal history of other endocrine, nutritional and metabolic disease: Secondary | ICD-10-CM | POA: Insufficient documentation

## 2015-04-14 DIAGNOSIS — Z720 Tobacco use: Secondary | ICD-10-CM | POA: Insufficient documentation

## 2015-04-14 DIAGNOSIS — Z792 Long term (current) use of antibiotics: Secondary | ICD-10-CM | POA: Insufficient documentation

## 2015-04-14 DIAGNOSIS — I2510 Atherosclerotic heart disease of native coronary artery without angina pectoris: Secondary | ICD-10-CM | POA: Insufficient documentation

## 2015-04-14 LAB — URINE MICROSCOPIC-ADD ON

## 2015-04-14 LAB — BASIC METABOLIC PANEL
Anion gap: 10 (ref 5–15)
BUN: 10 mg/dL (ref 6–23)
CO2: 26 mmol/L (ref 19–32)
CREATININE: 1.09 mg/dL (ref 0.50–1.35)
Calcium: 8.6 mg/dL (ref 8.4–10.5)
Chloride: 97 mmol/L (ref 96–112)
GFR calc non Af Amer: 87 mL/min — ABNORMAL LOW (ref >90)
GLUCOSE: 95 mg/dL (ref 70–99)
POTASSIUM: 3.3 mmol/L — AB (ref 3.5–5.1)
Sodium: 133 mmol/L — ABNORMAL LOW (ref 135–145)

## 2015-04-14 LAB — URINALYSIS, ROUTINE W REFLEX MICROSCOPIC
Bilirubin Urine: NEGATIVE
GLUCOSE, UA: NEGATIVE mg/dL
KETONES UR: NEGATIVE mg/dL
Leukocytes, UA: NEGATIVE
NITRITE: NEGATIVE
PH: 6 (ref 5.0–8.0)
Specific Gravity, Urine: 1.028 (ref 1.005–1.030)
Urobilinogen, UA: 1 mg/dL (ref 0.0–1.0)

## 2015-04-14 LAB — CBC
HCT: 35.2 % — ABNORMAL LOW (ref 39.0–52.0)
Hemoglobin: 12.8 g/dL — ABNORMAL LOW (ref 13.0–17.0)
MCH: 31.6 pg (ref 26.0–34.0)
MCHC: 36.4 g/dL — ABNORMAL HIGH (ref 30.0–36.0)
MCV: 86.9 fL (ref 78.0–100.0)
Platelets: 208 10*3/uL (ref 150–400)
RBC: 4.05 MIL/uL — ABNORMAL LOW (ref 4.22–5.81)
RDW: 13.4 % (ref 11.5–15.5)
WBC: 10.7 10*3/uL — AB (ref 4.0–10.5)

## 2015-04-14 MED ORDER — ACETAMINOPHEN 500 MG PO TABS
1000.0000 mg | ORAL_TABLET | Freq: Once | ORAL | Status: AC
  Administered 2015-04-14: 1000 mg via ORAL
  Filled 2015-04-14: qty 2

## 2015-04-14 MED ORDER — ALBUTEROL SULFATE HFA 108 (90 BASE) MCG/ACT IN AERS
2.0000 | INHALATION_SPRAY | Freq: Once | RESPIRATORY_TRACT | Status: AC
  Administered 2015-04-14: 2 via RESPIRATORY_TRACT
  Filled 2015-04-14: qty 6.7

## 2015-04-14 MED ORDER — POTASSIUM CHLORIDE CRYS ER 20 MEQ PO TBCR
40.0000 meq | EXTENDED_RELEASE_TABLET | Freq: Once | ORAL | Status: AC
  Administered 2015-04-14: 40 meq via ORAL
  Filled 2015-04-14: qty 2

## 2015-04-14 NOTE — ED Provider Notes (Signed)
CSN: [id number]     Arrival date & time 04/14/15  1411 History   First MD Initiated Contact with Patient 04/14/15 1617     Chief Complaint  Patient presents with  . Fever  . Shortness of Breath     (Consider location/radiation/quality/duration/timing/severity/associated sxs/prior Treatment) HPI  Pt presenting with c/o pain with coughing, mild shortness of breath and low grade fever.  He had bronchoscopy performed 4 days ago to evaluate right upper lung mass.  The surg path results of this were negative.  Pt states for the past 2 nights he has had chills/sweats- tmax 101.  No vomiting or diarrhea.  No abdominal pain or dysuria  Mild cough.  No sore throat.  He has continued to drink liquids well.  He is on his HIV meds and there have been on recent changes.  Last CD4 count was one month ago and it was 320- higher than values for the past 9 months per chart review.  There are no other associated systemic symptoms, there are no other alleviating or modifying factors.   Past Medical History  Diagnosis Date  . Chest pain 2010  . Hyperlipidemia   . Coronary artery disease     SPONTANEOUS DISSECTION AND PLAQUE RUPTURE OF HIS LEFT ANTERIOR DESCENDING ARTERY  . MI (myocardial infarction)   . HIV infection    Past Surgical History  Procedure Laterality Date  . Cardiac catheterization  06/24/2009    EF 60%  . Cardiac catheterization  06/17/2009    EF 45%. ANTERIOR HYPOKINESIS  . US echocardiography  12/09/2009    EF 55-60%  . Video bronchoscopy Bilateral 09/05/2014    Procedure: VIDEO BRONCHOSCOPY WITH FLUORO;  Surgeon: Merwyn Katos, MD;  Location: [facility] ENDOSCOPY;  Service: Cardiopulmonary;  Laterality: Bilateral;  . Video bronchoscopy Bilateral 04/10/2015    Procedure: VIDEO BRONCHOSCOPY WITH FLUORO;  Surgeon: Lupita Leash, MD;  Location: [facility] ENDOSCOPY;  Service: Cardiopulmonary;  Laterality: Bilateral;   Family History  Problem Relation Age of Onset  . Diabetes Mother   . Liver cancer  Father    History  Substance Use Topics  . Smoking status: Current Every Day Smoker -- 0.25 packs/day for 17 years    Types: Cigarettes    Last Attempt to Quit: 03/22/2014  . Smokeless tobacco: Never Used     Comment: has cut back significantly, 3 cigs/day.  03/19/15  . Alcohol Use: No    Review of Systems  ROS reviewed and all otherwise negative except for mentioned in HPI    Allergies  Review of patient's allergies indicates no known allergies.  Home Medications   Prior to Admission medications   Medication Sig Start Date End Date Taking? Authorizing Provider  albuterol (PROVENTIL HFA;VENTOLIN HFA) 108 (90 BASE) MCG/ACT inhaler Inhale 2 puffs into the lungs every 6 (six) hours as needed for wheezing or shortness of breath. 07/25/14   Randall Hiss, MD  dolutegravir (TIVICAY) 50 MG tablet Take 1 tablet (50 mg total) by mouth daily. 09/04/14   Gardiner Barefoot, MD  doxycycline (VIBRA-TABS) 100 MG tablet Take 1 tablet (100 mg total) by mouth 2 (two) times daily. 04/15/15   Lupita Leash, MD  emtricitabine-tenofovir (TRUVADA) 200-300 MG per tablet Take 1 tablet by mouth daily. 09/04/14   Gardiner Barefoot, MD  ibuprofen (ADVIL,MOTRIN) 200 MG tablet Take 200 mg by mouth every 6 (six) hours as needed (pain).    Historical Provider, MD   BP 106/57 mmHg  Pulse  91  Temp(Src) 99 F (37.2 C) (Oral)  Resp 16  Ht  (1.626 m)  Wt 132 lb 14.4 oz (60.283 kg)  BMI 22.80 kg/m2  SpO2 95%  Vitals reviewed Physical Exam  Physical Examination: General appearance - alert, well appearing, and in no distress Mental status - alert, oriented to person, place, and time Eyes - no conjunctival injection, no scleral icterus Mouth - mucous membranes moist, pharynx normal without lesions Chest - clear to auscultation, no wheezes, rales or rhonchi, symmetric air entry, BSS, normal respiatory effort Heart - normal rate, regular rhythm, normal S1, S2, no murmurs, rubs, clicks or gallops Abdomen -  soft, nontender, nondistended, no masses or organomegaly, nabs Extremities - peripheral pulses normal, no pedal edema, no clubbing or cyanosis Skin - normal coloration and turgor, no rashes  ED Course  Procedures (including critical care time) Labs Review Labs Reviewed  URINALYSIS, ROUTINE W REFLEX MICROSCOPIC - Abnormal; Notable for the following:    Color, Urine AMBER (*)    Hgb urine dipstick SMALL (*)    Protein, ur >300 (*)    All other components within normal limits  CBC - Abnormal; Notable for the following:    WBC 10.7 (*)    RBC 4.05 (*)    Hemoglobin 12.8 (*)    HCT 35.2 (*)    MCHC 36.4 (*)    All other components within normal limits  BASIC METABOLIC PANEL - Abnormal; Notable for the following:    Sodium 133 (*)    Potassium 3.3 (*)    GFR calc non Af Amer 87 (*)    All other components within normal limits  URINE MICROSCOPIC-ADD ON - Abnormal; Notable for the following:    Squamous Epithelial / LPF FEW (*)    All other components within normal limits    Imaging Review Dg Chest 2 View  04/14/2015   CLINICAL DATA:  Fever shortness of breath HIV positive bronchoscopy 3 days ago cough for 2 weeks history of COPD  EXAM: CHEST  2 VIEW  COMPARISON:  Multiple prior study  FINDINGS: Lucency right apex consistent with large bulla. Masslike opacity measuring about 3 cm along the inferior edge, which has been described on prior studies and could represent atypical infection or malignancy. Left lung is clear. Heart size is normal.  IMPRESSION: Persistent masslike density right upper lobe unchanged from CT scan 03/28/2015. Atypical infection or malignancy are differential diagnostic possibilities as previously described.   Electronically Signed   By: Esperanza Heir M.D.   On: 04/14/2015 17:09     EKG Interpretation None      MDM   Final diagnoses:  Bronchitis    Pt with hx of HIV, last CD4 count 320.  Pt had bronchoscopy this week- per surg path reports on chart this is  a benign mass.  CXR today unchanged from prior, no ptx or other acute findings.   Xray images reviewed and interpreted by me as well.   Pt given albuterol for likely bronchitis.  Discharged with strict return precautions.  Pt agreeable with plan.     Jerelyn Scott, MD 04/16/15 [phone]

## 2015-04-14 NOTE — ED Notes (Signed)
Pt had bronchoscopy on 04-10-15, since then his shortness of breath has increased, fevers x 2 days and he feels weak.  No respiratory difficulties noted, talking in complete sentences.

## 2015-04-14 NOTE — Discharge Instructions (Signed)
Return to the ED with any concerns including difficulty breathing despite using albuterol2 puffs  every 4 hours, not drinking fluids, decreased urine output, vomiting and not able to keep down liquids or medications, decreased level of alertness/lethargy, or any other alarming symptoms °

## 2015-04-15 ENCOUNTER — Telehealth: Payer: Self-pay | Admitting: Pulmonary Disease

## 2015-04-15 MED ORDER — DOXYCYCLINE HYCLATE 100 MG PO TABS: 100.0000 mg | ORAL_TABLET | Freq: Two times a day (BID) | ORAL | Status: DC

## 2015-04-15 NOTE — Telephone Encounter (Signed)
I called Rodney Walker to review his biopsy results.  It showed benign lung tissue again without evidence of malignancy.  I have asked the pathologists to perform special stains on the tissue for infectious etiologies, these are now pending.  He went to the ED yesterday complaining of fever and cough.  He was told that he had bronchitis and dc'd home.  He was not given antibiotics. I have reviewed his CXR which was benign.    I told him I will call in doxycycline 100mg  po bid x5 days.  He is to call us if he is not feeling better by the end of the week.

## 2015-04-16 ENCOUNTER — Emergency Department (HOSPITAL_COMMUNITY): Payer: Self-pay

## 2015-04-16 ENCOUNTER — Inpatient Hospital Stay (HOSPITAL_COMMUNITY)
Admission: EM | Admit: 2015-04-16 | Discharge: 2015-04-28 | DRG: 853 | Disposition: A | Discharge status: Home / Self Care | Payer: Self-pay | Attending: Cardiothoracic Surgery | Admitting: Cardiothoracic Surgery

## 2015-04-16 ENCOUNTER — Encounter (HOSPITAL_COMMUNITY): Payer: Self-pay | Admitting: *Deleted

## 2015-04-16 DIAGNOSIS — B20 Human immunodeficiency virus [HIV] disease: Secondary | ICD-10-CM | POA: Diagnosis present

## 2015-04-16 DIAGNOSIS — R911 Solitary pulmonary nodule: Secondary | ICD-10-CM | POA: Diagnosis present

## 2015-04-16 DIAGNOSIS — J189 Pneumonia, unspecified organism: Secondary | ICD-10-CM | POA: Diagnosis present

## 2015-04-16 DIAGNOSIS — Z7951 Long term (current) use of inhaled steroids: Secondary | ICD-10-CM

## 2015-04-16 DIAGNOSIS — J869 Pyothorax without fistula: Secondary | ICD-10-CM | POA: Diagnosis present

## 2015-04-16 DIAGNOSIS — J852 Abscess of lung without pneumonia: Secondary | ICD-10-CM

## 2015-04-16 DIAGNOSIS — A419 Sepsis, unspecified organism: Principal | ICD-10-CM | POA: Diagnosis present

## 2015-04-16 DIAGNOSIS — E785 Hyperlipidemia, unspecified: Secondary | ICD-10-CM | POA: Diagnosis present

## 2015-04-16 DIAGNOSIS — J439 Emphysema, unspecified: Secondary | ICD-10-CM | POA: Insufficient documentation

## 2015-04-16 DIAGNOSIS — R5082 Postprocedural fever: Secondary | ICD-10-CM

## 2015-04-16 DIAGNOSIS — Z9689 Presence of other specified functional implants: Secondary | ICD-10-CM

## 2015-04-16 DIAGNOSIS — E875 Hyperkalemia: Secondary | ICD-10-CM | POA: Diagnosis present

## 2015-04-16 DIAGNOSIS — I2510 Atherosclerotic heart disease of native coronary artery without angina pectoris: Secondary | ICD-10-CM | POA: Diagnosis present

## 2015-04-16 DIAGNOSIS — J181 Lobar pneumonia, unspecified organism: Secondary | ICD-10-CM | POA: Insufficient documentation

## 2015-04-16 DIAGNOSIS — Z79899 Other long term (current) drug therapy: Secondary | ICD-10-CM

## 2015-04-16 DIAGNOSIS — R509 Fever, unspecified: Secondary | ICD-10-CM | POA: Diagnosis present

## 2015-04-16 DIAGNOSIS — R918 Other nonspecific abnormal finding of lung field: Secondary | ICD-10-CM | POA: Diagnosis present

## 2015-04-16 DIAGNOSIS — R0602 Shortness of breath: Secondary | ICD-10-CM

## 2015-04-16 DIAGNOSIS — I252 Old myocardial infarction: Secondary | ICD-10-CM

## 2015-04-16 DIAGNOSIS — Z4682 Encounter for fitting and adjustment of non-vascular catheter: Secondary | ICD-10-CM

## 2015-04-16 DIAGNOSIS — J851 Abscess of lung with pneumonia: Secondary | ICD-10-CM | POA: Diagnosis present

## 2015-04-16 DIAGNOSIS — F17210 Nicotine dependence, cigarettes, uncomplicated: Secondary | ICD-10-CM | POA: Diagnosis present

## 2015-04-16 DIAGNOSIS — Z21 Asymptomatic human immunodeficiency virus [HIV] infection status: Secondary | ICD-10-CM | POA: Diagnosis present

## 2015-04-16 DIAGNOSIS — D649 Anemia, unspecified: Secondary | ICD-10-CM | POA: Diagnosis present

## 2015-04-16 DIAGNOSIS — R0902 Hypoxemia: Secondary | ICD-10-CM | POA: Diagnosis present

## 2015-04-16 DIAGNOSIS — Z7982 Long term (current) use of aspirin: Secondary | ICD-10-CM

## 2015-04-16 DIAGNOSIS — K5900 Constipation, unspecified: Secondary | ICD-10-CM | POA: Diagnosis present

## 2015-04-16 LAB — URINALYSIS, ROUTINE W REFLEX MICROSCOPIC
Bilirubin Urine: NEGATIVE
GLUCOSE, UA: NEGATIVE mg/dL
Ketones, ur: NEGATIVE mg/dL
Leukocytes, UA: NEGATIVE
Nitrite: NEGATIVE
Protein, ur: 100 mg/dL — AB
Specific Gravity, Urine: 1.018 (ref 1.005–1.030)
Urobilinogen, UA: 1 mg/dL (ref 0.0–1.0)
pH: 6 (ref 5.0–8.0)

## 2015-04-16 LAB — COMPREHENSIVE METABOLIC PANEL
ALBUMIN: 2.7 g/dL — AB (ref 3.5–5.2)
ALK PHOS: 79 U/L (ref 39–117)
ALT: 40 U/L (ref 0–53)
ANION GAP: 9 (ref 5–15)
AST: 54 U/L — AB (ref 0–37)
BILIRUBIN TOTAL: 2.5 mg/dL — AB (ref 0.3–1.2)
BUN: 9 mg/dL (ref 6–23)
CO2: 23 mmol/L (ref 19–32)
Calcium: 8.5 mg/dL (ref 8.4–10.5)
Chloride: 101 mmol/L (ref 96–112)
Creatinine, Ser: 1.13 mg/dL (ref 0.50–1.35)
GFR calc Af Amer: >90 mL/min (ref >90)
GFR calc non Af Amer: 83 mL/min — ABNORMAL LOW (ref >90)
Glucose, Bld: 113 mg/dL — ABNORMAL HIGH (ref 70–99)
Potassium: 5.6 mmol/L — ABNORMAL HIGH (ref 3.5–5.1)
SODIUM: 133 mmol/L — AB (ref 135–145)
TOTAL PROTEIN: 6.7 g/dL (ref 6.0–8.3)

## 2015-04-16 LAB — URINE MICROSCOPIC-ADD ON

## 2015-04-16 LAB — CBC WITH DIFFERENTIAL/PLATELET
Basophils Absolute: 0 10*3/uL (ref 0.0–0.1)
Basophils Relative: 0 % (ref 0–1)
Eosinophils Absolute: 0 10*3/uL (ref 0.0–0.7)
Eosinophils Relative: 0 % (ref 0–5)
HCT: 32.5 % — ABNORMAL LOW (ref 39.0–52.0)
Hemoglobin: 11.7 g/dL — ABNORMAL LOW (ref 13.0–17.0)
LYMPHS ABS: 0.7 10*3/uL (ref 0.7–4.0)
Lymphocytes Relative: 8 % — ABNORMAL LOW (ref 12–46)
MCH: 31.9 pg (ref 26.0–34.0)
MCHC: 36 g/dL (ref 30.0–36.0)
MCV: 88.6 fL (ref 78.0–100.0)
MONOS PCT: 7 % (ref 3–12)
Monocytes Absolute: 0.7 10*3/uL (ref 0.1–1.0)
NEUTROS ABS: 8.1 10*3/uL — AB (ref 1.7–7.7)
Neutrophils Relative %: 85 % — ABNORMAL HIGH (ref 43–77)
PLATELETS: 199 10*3/uL (ref 150–400)
RBC: 3.67 MIL/uL — ABNORMAL LOW (ref 4.22–5.81)
RDW: 13.7 % (ref 11.5–15.5)
WBC: 9.5 10*3/uL (ref 4.0–10.5)

## 2015-04-16 LAB — I-STAT CG4 LACTIC ACID, ED
Lactic Acid, Venous: 1.7 mmol/L (ref 0.5–2.0)
Lactic Acid, Venous: 1.05 mmol/L (ref 0.5–2.0)

## 2015-04-16 LAB — STREP PNEUMONIAE URINARY ANTIGEN: Strep Pneumo Urinary Antigen: NEGATIVE

## 2015-04-16 MED ORDER — ALBUTEROL SULFATE HFA 108 (90 BASE) MCG/ACT IN AERS: 2.0000 | INHALATION_SPRAY | Freq: Four times a day (QID) | RESPIRATORY_TRACT | Status: DC | PRN

## 2015-04-16 MED ORDER — ENOXAPARIN SODIUM 40 MG/0.4ML ~~LOC~~ SOLN
40.0000 mg | SUBCUTANEOUS | Status: DC
  Administered 2015-04-16 – 2015-04-21 (×6): 40 mg via SUBCUTANEOUS
  Filled 2015-04-16 (×9): qty 0.4

## 2015-04-16 MED ORDER — DOLUTEGRAVIR SODIUM 50 MG PO TABS
50.0000 mg | ORAL_TABLET | Freq: Every day | ORAL | Status: DC
  Administered 2015-04-16 – 2015-04-28 (×12): 50 mg via ORAL
  Filled 2015-04-16 (×13): qty 1

## 2015-04-16 MED ORDER — SODIUM CHLORIDE 0.9 % IV SOLN
INTRAVENOUS | Status: AC
  Administered 2015-04-16: 22:00:00 via INTRAVENOUS

## 2015-04-16 MED ORDER — SODIUM CHLORIDE 0.9 % IV SOLN: INTRAVENOUS | Status: AC

## 2015-04-16 MED ORDER — IOHEXOL 300 MG/ML  SOLN
80.0000 mL | Freq: Once | INTRAMUSCULAR | Status: AC | PRN
  Administered 2015-04-16: 80 mL via INTRAVENOUS

## 2015-04-16 MED ORDER — CEFTRIAXONE SODIUM IN DEXTROSE 20 MG/ML IV SOLN
1.0000 g | INTRAVENOUS | Status: DC
  Administered 2015-04-16: 1 g via INTRAVENOUS
  Filled 2015-04-16 (×2): qty 50

## 2015-04-16 MED ORDER — EMTRICITABINE-TENOFOVIR DF 200-300 MG PO TABS
1.0000 | ORAL_TABLET | Freq: Every day | ORAL | Status: DC
  Administered 2015-04-16 – 2015-04-28 (×12): 1 via ORAL
  Filled 2015-04-16 (×13): qty 1

## 2015-04-16 MED ORDER — PIPERACILLIN-TAZOBACTAM 3.375 G IVPB 30 MIN
3.3750 g | Freq: Once | INTRAVENOUS | Status: AC
Start: 2015-04-16 — End: 2015-04-16
  Administered 2015-04-16: 3.375 g via INTRAVENOUS
  Filled 2015-04-16: qty 50

## 2015-04-16 MED ORDER — CEFTRIAXONE SODIUM IN DEXTROSE 20 MG/ML IV SOLN
1.0000 g | INTRAVENOUS | Status: DC
  Filled 2015-04-16: qty 50

## 2015-04-16 MED ORDER — VANCOMYCIN HCL IN DEXTROSE 1-5 GM/200ML-% IV SOLN
1000.0000 mg | Freq: Once | INTRAVENOUS | Status: AC
  Administered 2015-04-16: 1000 mg via INTRAVENOUS
  Filled 2015-04-16: qty 200

## 2015-04-16 MED ORDER — IBUPROFEN 200 MG PO TABS
200.0000 mg | ORAL_TABLET | Freq: Four times a day (QID) | ORAL | Status: DC | PRN
  Administered 2015-04-16 – 2015-04-17 (×2): 200 mg via ORAL
  Filled 2015-04-16 (×2): qty 1

## 2015-04-16 MED ORDER — HYDROMORPHONE HCL 1 MG/ML IJ SOLN
1.0000 mg | Freq: Once | INTRAMUSCULAR | Status: AC
  Administered 2015-04-16: 1 mg via INTRAVENOUS
  Filled 2015-04-16: qty 1

## 2015-04-16 MED ORDER — ALBUTEROL SULFATE (2.5 MG/3ML) 0.083% IN NEBU: 2.5000 mg | INHALATION_SOLUTION | Freq: Four times a day (QID) | RESPIRATORY_TRACT | Status: DC | PRN

## 2015-04-16 MED ORDER — SODIUM CHLORIDE 0.9 % IV BOLUS (SEPSIS)
30.0000 mL/kg | Freq: Once | INTRAVENOUS | Status: AC
  Administered 2015-04-16: 1000 mL via INTRAVENOUS

## 2015-04-16 MED ORDER — SODIUM CHLORIDE 0.9 % IV BOLUS (SEPSIS)
1000.0000 mL | Freq: Once | INTRAVENOUS | Status: AC
  Administered 2015-04-16: 1000 mL via INTRAVENOUS

## 2015-04-16 MED ORDER — ACETAMINOPHEN 500 MG PO TABS: 1000.0000 mg | ORAL_TABLET | Freq: Once | ORAL | Status: DC

## 2015-04-16 NOTE — H&P (Signed)
PCP:   No PCP Per Patient   Chief Complaint:  fever  HPI: 34 yo male hiv positive dx mar 2015, rul lung mass for past year s/p lung biopsy 5 days ago since has had night spikes in fever over 102 for past 4 nights.  Has cough, which is chronic and not much changed, having uri symptoms for several weeks.  No le edema or swelling.  No chest pain.  No dysuria, no rashes.  HIV is well controlled, lung biopsy and cultures done from 5 days ago all have mostly come back negative thus far.  No n/v/d.  No abd pain.  Ct shows rul lung mass with possible postobs pneumonititis.  Referred for admission for pna in hiv patient.  He did see his pcp yesterday who started him on doxycycline but pt could not fill this because he could not afford to pay for the medication.  Review of Systems:  Positive and negative as per HPI otherwise all other systems are negative  Past Medical History: Past Medical History  Diagnosis Date  . Chest pain 2010  . Hyperlipidemia   . Coronary artery disease     SPONTANEOUS DISSECTION AND PLAQUE RUPTURE OF HIS LEFT ANTERIOR DESCENDING ARTERY  . MI (myocardial infarction)   . HIV infection    Past Surgical History  Procedure Laterality Date  . Cardiac catheterization  06/24/2009    EF 60%  . Cardiac catheterization  06/17/2009    EF 45%. ANTERIOR HYPOKINESIS  . US echocardiography  12/09/2009    EF 55-60%  . Video bronchoscopy Bilateral 09/05/2014    Procedure: VIDEO BRONCHOSCOPY WITH FLUORO;  Surgeon: Merwyn Katos, MD;  Location: [facility] ENDOSCOPY;  Service: Cardiopulmonary;  Laterality: Bilateral;  . Video bronchoscopy Bilateral 04/10/2015    Procedure: VIDEO BRONCHOSCOPY WITH FLUORO;  Surgeon: Lupita Leash, MD;  Location: [facility] ENDOSCOPY;  Service: Cardiopulmonary;  Laterality: Bilateral;    Medications: Prior to Admission medications   Medication Sig Start Date End Date Taking? Authorizing Provider  albuterol (PROVENTIL HFA;VENTOLIN HFA) 108 (90 BASE) MCG/ACT inhaler  Inhale 2 puffs into the lungs every 6 (six) hours as needed for wheezing or shortness of breath. 07/25/14  Yes Randall Hiss, MD  aspirin 325 MG tablet Take 650 mg by mouth daily as needed for fever.   Yes Historical Provider, MD  dolutegravir (TIVICAY) 50 MG tablet Take 1 tablet (50 mg total) by mouth daily. 09/04/14  Yes Gardiner Barefoot, MD  doxycycline (VIBRA-TABS) 100 MG tablet Take 1 tablet (100 mg total) by mouth 2 (two) times daily. 04/15/15  Yes Lupita Leash, MD  emtricitabine-tenofovir (TRUVADA) 200-300 MG per tablet Take 1 tablet by mouth daily. 09/04/14  Yes Gardiner Barefoot, MD  ibuprofen (ADVIL,MOTRIN) 200 MG tablet Take 200 mg by mouth every 6 (six) hours as needed (pain).   Yes Historical Provider, MD    Allergies:  No Known Allergies  Social History:  reports that he has been smoking Cigarettes.  He has a 4.25 pack-year smoking history. He has never used smokeless tobacco. He reports that he uses illicit drugs (Marijuana) about 4 times per week. He reports that he does not drink alcohol.  Family History: Family History  Problem Relation Age of Onset  . Diabetes Mother   . Liver cancer Father     Physical Exam: Filed Vitals:   04/16/15 1800 04/16/15 1804 04/16/15 1815 04/16/15 1830  BP: 114/69  112/61 129/68  Pulse: 90  87  Temp:  99.2 F (37.3 C)    TempSrc:  Oral    Resp:   21 15  Height:      Weight:      SpO2: 95%  93%    General appearance: alert, cooperative and no distress Head: Normocephalic, without obvious abnormality, atraumatic Eyes: negative Nose: Nares normal. Septum midline. Mucosa normal. No drainage or sinus tenderness. Neck: no JVD and supple, symmetrical, trachea midline Lungs: clear to auscultation bilaterally Heart: regular rate and rhythm, S1, S2 normal, no murmur, click, rub or gallop Abdomen: soft, non-tender; bowel sounds normal; no masses,  no organomegaly Extremities: extremities normal, atraumatic, no cyanosis or edema Pulses:  2+ and symmetric Skin: Skin color, texture, turgor normal. No rashes or lesions Neurologic: Grossly normal   Labs on Admission:   Recent Labs  04/14/15 1746 04/16/15 1514  NA 133* 133*  K 3.3* 5.6*  CL 97 101  CO2 26 23  GLUCOSE 95 113*  BUN 10 9  CREATININE 1.09 1.13  CALCIUM 8.6 8.5    Recent Labs  04/16/15 1514  AST 54*  ALT 40  ALKPHOS 79  BILITOT 2.5*  PROT 6.7  ALBUMIN 2.7*    Recent Labs  04/14/15 1746 04/16/15 1514  WBC 10.7* 9.5  NEUTROABS  --  8.1*  HGB 12.8* 11.7*  HCT 35.2* 32.5*  MCV 86.9 88.6  PLT 208 199    Radiological Exams on Admission: Dg Chest 2 View  04/16/2015   CLINICAL DATA:  Right upper back pain, cough and fever, HIV infection  EXAM: CHEST  2 VIEW  COMPARISON:  04/14/2015 and 03/28/2015  FINDINGS: Cardiomediastinal silhouette is stable. Again noted significant bullous changes bilateral upper lobe. Persistent masslike opacity in right upper lobe which is stable. Measures about 3.4 cm. There is new air-fluid level in right suprahilar region. Layering fluid within emphysematous bullae suspected. Infected fluid cannot be excluded. Clinical correlation is necessary. Left lung is clear.  IMPRESSION: Again noted significant bullous changes bilateral upper lobe. Persistent masslike opacity in right upper lobe which is stable. Measures about 3.4 cm. There is new air-fluid level in right suprahilar region. Layering fluid within emphysematous bullae suspected. Infected fluid cannot be excluded. Clinical correlation is necessary.   Electronically Signed   By: Natasha Mead M.D.   On: 04/16/2015 16:11   Dg Chest 2 View  04/14/2015   CLINICAL DATA:  Fever shortness of breath HIV positive bronchoscopy 3 days ago cough for 2 weeks history of COPD  EXAM: CHEST  2 VIEW  COMPARISON:  Multiple prior study  FINDINGS: Lucency right apex consistent with large bulla. Masslike opacity measuring about 3 cm along the inferior edge, which has been described on prior  studies and could represent atypical infection or malignancy. Left lung is clear. Heart size is normal.  IMPRESSION: Persistent masslike density right upper lobe unchanged from CT scan 03/28/2015. Atypical infection or malignancy are differential diagnostic possibilities as previously described.   Electronically Signed   By: Esperanza Heir M.D.   On: 04/14/2015 17:09   Ct Chest Wo Contrast  03/28/2015   CLINICAL DATA:  Followup pulmonary mass, shortness of breath, history of myocardial infarction and HIV. Subsequent encounter.  EXAM: CT CHEST WITHOUT CONTRAST  TECHNIQUE: Multidetector CT imaging of the chest was performed following the standard protocol without IV contrast.  COMPARISON:  10/01/2014, 08/31/2014, 03/18/2014 and 06/15/2009.  FINDINGS: Mediastinum/Nodes: Mediastinal lymph nodes measure up to 10 mm in the lower right paratracheal station, stable. No pericardial  effusion.  Lungs/Pleura: Bullous emphysema with centrilobular and paraseptal components. Bullous changes are worst in the apex of the right hemi thorax. There is a nodular masslike opacity in the right suprahilar region, measuring approximately 2.5 x 3.5 cm (series 3, image 23), stable from 10/01/2014 but slightly smaller than on 08/31/2014 (2.7 x 3.8 cm) and new from 03/18/2014. There is surrounding nodularity, possibly postobstructive in etiology. Tiny nodules in the left upper lobe measure up to 6 mm and are unchanged from 08/31/2014 but new from 03/18/2014. No pleural fluid. Airway is otherwise unremarkable.  Upper abdomen: Visualized portions of the liver and adrenal glands are unremarkable. There may be a punctate stone in the right kidney. Visualized portions of the kidneys, spleen, pancreas, stomach and bowel are otherwise grossly unremarkable. No upper abdominal adenopathy.  Musculoskeletal: No worrisome lytic or sclerotic lesions.  IMPRESSION: 1. Masslike area consolidation in the suprahilar right upper lobe, with surrounding  nodularity, stable from 10/01/2014 and slightly smaller from 08/31/2014. Finding is new from 03/18/2014. Finding is worrisome for malignancy. Given the patient's HIV status, an atypical infectious process is not excluded. 2. Scattered small left upper lobe nodules are unchanged from 08/31/2014. 3. Probable punctate stone in the right kidney.   Electronically Signed   By: Leanna Battles M.D.   On: 03/28/2015 10:21   Ct Chest W Contrast  04/16/2015   CLINICAL DATA:  Shortness of breath, chest pain, upper back pain, fever, cough, history coronary artery disease, HIV  EXAM: CT CHEST WITH CONTRAST  TECHNIQUE: Multidetector CT imaging of the chest was performed during intravenous contrast administration. Sagittal and coronal MPR images reconstructed from axial data set.  CONTRAST:  80mL OMNIPAQUE IOHEXOL 300 MG/ML  SOLN IV  COMPARISON:  03/28/2015  FINDINGS: Thoracic vascular structures grossly patent on nondedicated exam.  Minimally enlarged RIGHT peritracheal lymph node 11 mm short axis image 14 little changed.  Multiple additional normal size mediastinal lymph nodes identified.  Visualized portion of upper abdomen unremarkable.  Severe bullous disease RIGHT apex with scattered emphysematous changes in both upper lobes.  Persistent masslike opacity in RIGHT upper lobe 4.8 x 4.6 cm image 21 previously 3.5 x 2.5 cm.  Several small adjacent nodular foci are identified with additional postobstructive pneumonitis in the RIGHT upper lobe.  Stable small nonspecific nodular foci in both lungs.  Dependent atelectasis in the lower lobes bilaterally.  No pulmonary infiltrate, pleural effusion or pneumothorax.  Osseous structures unremarkable.  IMPRESSION: Increase in size of mass-like opacity in RIGHT upper lobe now measuring 4.8 x 4.6 cm, worrisome for malignancy.  Postobstructive pneumonitis RIGHT upper lobe.  Severe emphysematous changes with bullous disease particularly at RIGHT apex.  Scattered BILATERAL pulmonary  nodules, little changed since 03/28/2015; recommend assessment on follow-up imaging.   Electronically Signed   By: Ulyses Southward M.D.   On: 04/16/2015 17:49   Dg Chest Port 1 View  04/10/2015   CLINICAL DATA:  Postop bronchoscopy with biopsy.  EXAM: PORTABLE CHEST - 1 VIEW  COMPARISON:  CT chest 03/28/2015, chest x-ray 01/13/2015  FINDINGS: Large bleb formation right apex unchanged from prior studies. No pneumothorax. Right upper lobe mass lesion contains surrounding airspace disease compatible with recent biopsy. This may be due to hemorrhage or lavage.  Left lung is clear.  No pleural effusion or heart failure.  IMPRESSION: No pneumothorax post bronchoscopy.  Large right upper lobe bleb is unchanged. Right upper lobe mass lesion shows surrounding infiltrate consistent with recent biopsy.  These results will be called  to the ordering clinician or representative by the Radiologist Assistant, and communication documented in the PACS or zVision Dashboard.   Electronically Signed   By: Marlan Palau M.D.   On: 04/10/2015 12:24   Dg C-arm Bronchoscopy  04/10/2015   CLINICAL DATA:    C-ARM BRONCHOSCOPY  Fluoroscopy was utilized by the requesting physician.  No radiographic  interpretation.     Assessment/Plan  34 yo male HIV with febrile illness likely pulm etiology  Principal Problem:   PNA (pneumonia)-  Spoke to ID dr comer about case, advised placing on rocephin only.  Place on pna pathway, iv rocephin.  Quick flu pending.  ID to see pt in am. Vss, lactic acid level nml.  Active Problems:   Fever-  Blood cx, sput cx ordered.   Lung mass-  Cont f/u with pulm and ID as outpt, considering next step in w/u to see CT surgery, not necessary during this acute illness.   HIV (human immunodeficiency virus infection)-  Cont hiv meds.   Hyperkalemia-  Ivf,   Admit to tele bed.  Full code.     Danyale Ridinger A 04/16/2015, 6:49 PM

## 2015-04-16 NOTE — ED Notes (Signed)
Patient transported to X-ray 

## 2015-04-16 NOTE — ED Notes (Signed)
MD at bedside. 

## 2015-04-16 NOTE — ED Notes (Signed)
Attempted report 

## 2015-04-16 NOTE — ED Notes (Signed)
Patient transported to CT 

## 2015-04-16 NOTE — ED Notes (Signed)
Per EMS-pt comes from home c/o upper back pain beginning this AM.  Pt had broncosopy last week for mass on lung.  Pt reports fever and cough since last Friday.  Hx: HIV, COPD, PNA.  Pt reports MD prescribing abx yesterday for bronchitis but unable to afford.  Pt a x 4, NAD.  BP-122/58 P-108  R-20 Temp-102 oral SpO2-98% RA, 4mg  Zofran, 1000 Tylenol given in route.  12 lead EKG unremarkable.

## 2015-04-16 NOTE — ED Provider Notes (Signed)
CSN: [id number]     Arrival date & time 04/16/15  1517 History   First MD Initiated Contact with Patient 04/16/15 1522     Chief Complaint  Patient presents with  . Back Pain  . Fever  . Cough     (Consider location/radiation/quality/duration/timing/severity/associated sxs/prior Treatment) HPI The patient had a lung biopsy 6 days ago. He reports since that time he's been having nightly fevers up to 102. He reports chills association with this. He reports cough with sputum production. He describes green sputum with occasional blood tinged. The patient has a very sharp pain in his right upper back. He reports it becomes severe with cough, deep breath or movement. He vomited once this morning. No associated abdominal pain, diarrhea, GU symptoms. No associated lower extremity swelling or pain. No associated skin rash or joint swellings. The patient is a smoker and is HIV positive. Past Medical History  Diagnosis Date  . Chest pain 2010  . Hyperlipidemia   . Coronary artery disease     SPONTANEOUS DISSECTION AND PLAQUE RUPTURE OF HIS LEFT ANTERIOR DESCENDING ARTERY  . MI (myocardial infarction)   . HIV infection    Past Surgical History  Procedure Laterality Date  . Cardiac catheterization  06/24/2009    EF 60%  . Cardiac catheterization  06/17/2009    EF 45%. ANTERIOR HYPOKINESIS  . US echocardiography  12/09/2009    EF 55-60%  . Video bronchoscopy Bilateral 09/05/2014    Procedure: VIDEO BRONCHOSCOPY WITH FLUORO;  Surgeon: Merwyn Katos, MD;  Location: [facility] ENDOSCOPY;  Service: Cardiopulmonary;  Laterality: Bilateral;  . Video bronchoscopy Bilateral 04/10/2015    Procedure: VIDEO BRONCHOSCOPY WITH FLUORO;  Surgeon: Lupita Leash, MD;  Location: [facility] ENDOSCOPY;  Service: Cardiopulmonary;  Laterality: Bilateral;   Family History  Problem Relation Age of Onset  . Diabetes Mother   . Liver cancer Father    History  Substance Use Topics  . Smoking status: Current Every Day Smoker --  0.25 packs/day for 17 years    Types: Cigarettes    Last Attempt to Quit: 03/22/2014  . Smokeless tobacco: Never Used     Comment: has cut back significantly, 3 cigs/day.  03/19/15  . Alcohol Use: No    Review of Systems 10 Systems reviewed and are negative for acute change except as noted in the HPI.    Allergies  Review of patient's allergies indicates no known allergies.  Home Medications   Prior to Admission medications   Medication Sig Start Date End Date Taking? Authorizing Provider  albuterol (PROVENTIL HFA;VENTOLIN HFA) 108 (90 BASE) MCG/ACT inhaler Inhale 2 puffs into the lungs every 6 (six) hours as needed for wheezing or shortness of breath. 07/25/14  Yes Randall Hiss, MD  aspirin 325 MG tablet Take 650 mg by mouth daily as needed for fever.   Yes Historical Provider, MD  dolutegravir (TIVICAY) 50 MG tablet Take 1 tablet (50 mg total) by mouth daily. 09/04/14  Yes Gardiner Barefoot, MD  doxycycline (VIBRA-TABS) 100 MG tablet Take 1 tablet (100 mg total) by mouth 2 (two) times daily. 04/15/15  Yes Lupita Leash, MD  emtricitabine-tenofovir (TRUVADA) 200-300 MG per tablet Take 1 tablet by mouth daily. 09/04/14  Yes Gardiner Barefoot, MD  ibuprofen (ADVIL,MOTRIN) 200 MG tablet Take 200 mg by mouth every 6 (six) hours as needed (pain).   Yes Historical Provider, MD   BP 129/68 mmHg  Pulse 87  Temp(Src) 99.2 F (37.3 C) (Oral)  Resp 15  Ht 5\' 4"  (1.626 m)  Wt 132 lb (59.875 kg)  BMI 22.65 kg/m2  SpO2 93% Physical Exam  Constitutional: He is oriented to person, place, and time. He appears well-developed and well-nourished.  The patient appears to have pain with movement or deep inspiration. He is nontoxic alert and otherwise well in appearance without respiratory distress.  HENT:  Head: Normocephalic and atraumatic.  Nose: Nose normal.  Mouth/Throat: Oropharynx is clear and moist. No oropharyngeal exudate.  Eyes: EOM are normal. Pupils are equal, round, and reactive to  light. No scleral icterus.  Neck: Neck supple.  Cardiovascular: Normal rate, regular rhythm, normal heart sounds and intact distal pulses.   Pulmonary/Chest: Effort normal and breath sounds normal.  Right upper thoracic back pain severe with movements and positioning.  Abdominal: Soft. Bowel sounds are normal. He exhibits no distension. There is no tenderness.  Musculoskeletal: Normal range of motion. He exhibits no edema.  Neurological: He is alert and oriented to person, place, and time. He has normal strength. Coordination normal. GCS eye subscore is 4. GCS verbal subscore is 5. GCS motor subscore is 6.  Skin: Skin is warm, dry and intact.  Psychiatric: He has a normal mood and affect.    ED Course  Procedures (including critical care time) Labs Review Labs Reviewed  CBC WITH DIFFERENTIAL/PLATELET - Abnormal; Notable for the following:    RBC 3.67 (*)    Hemoglobin 11.7 (*)    HCT 32.5 (*)    Neutrophils Relative % 85 (*)    Neutro Abs 8.1 (*)    Lymphocytes Relative 8 (*)    All other components within normal limits  COMPREHENSIVE METABOLIC PANEL - Abnormal; Notable for the following:    Sodium 133 (*)    Potassium 5.6 (*)    Glucose, Bld 113 (*)    Albumin 2.7 (*)    AST 54 (*)    Total Bilirubin 2.5 (*)    GFR calc non Af Amer 83 (*)    All other components within normal limits  URINALYSIS, ROUTINE W REFLEX MICROSCOPIC - Abnormal; Notable for the following:    Hgb urine dipstick SMALL (*)    Protein, ur 100 (*)    All other components within normal limits  URINE MICROSCOPIC-ADD ON - Abnormal; Notable for the following:    Casts HYALINE CASTS (*)    All other components within normal limits  URINE CULTURE  CULTURE, BLOOD (ROUTINE X 2)  CULTURE, BLOOD (ROUTINE X 2)  I-STAT CG4 LACTIC ACID, ED  I-STAT CG4 LACTIC ACID, ED    Imaging Review Dg Chest 2 View  04/16/2015   CLINICAL DATA:  Right upper back pain, cough and fever, HIV infection  EXAM: CHEST  2 VIEW   COMPARISON:  04/14/2015 and 03/28/2015  FINDINGS: Cardiomediastinal silhouette is stable. Again noted significant bullous changes bilateral upper lobe. Persistent masslike opacity in right upper lobe which is stable. Measures about 3.4 cm. There is new air-fluid level in right suprahilar region. Layering fluid within emphysematous bullae suspected. Infected fluid cannot be excluded. Clinical correlation is necessary. Left lung is clear.  IMPRESSION: Again noted significant bullous changes bilateral upper lobe. Persistent masslike opacity in right upper lobe which is stable. Measures about 3.4 cm. There is new air-fluid level in right suprahilar region. Layering fluid within emphysematous bullae suspected. Infected fluid cannot be excluded. Clinical correlation is necessary.   Electronically Signed   By: Natasha Mead M.D.   On: 04/16/2015 16:11  Ct Chest W Contrast  04/16/2015   CLINICAL DATA:  Shortness of breath, chest pain, upper back pain, fever, cough, history coronary artery disease, HIV  EXAM: CT CHEST WITH CONTRAST  TECHNIQUE: Multidetector CT imaging of the chest was performed during intravenous contrast administration. Sagittal and coronal MPR images reconstructed from axial data set.  CONTRAST:  80mL OMNIPAQUE IOHEXOL 300 MG/ML  SOLN IV  COMPARISON:  03/28/2015  FINDINGS: Thoracic vascular structures grossly patent on nondedicated exam.  Minimally enlarged RIGHT peritracheal lymph node 11 mm short axis image 14 little changed.  Multiple additional normal size mediastinal lymph nodes identified.  Visualized portion of upper abdomen unremarkable.  Severe bullous disease RIGHT apex with scattered emphysematous changes in both upper lobes.  Persistent masslike opacity in RIGHT upper lobe 4.8 x 4.6 cm image 21 previously 3.5 x 2.5 cm.  Several small adjacent nodular foci are identified with additional postobstructive pneumonitis in the RIGHT upper lobe.  Stable small nonspecific nodular foci in both lungs.   Dependent atelectasis in the lower lobes bilaterally.  No pulmonary infiltrate, pleural effusion or pneumothorax.  Osseous structures unremarkable.  IMPRESSION: Increase in size of mass-like opacity in RIGHT upper lobe now measuring 4.8 x 4.6 cm, worrisome for malignancy.  Postobstructive pneumonitis RIGHT upper lobe.  Severe emphysematous changes with bullous disease particularly at RIGHT apex.  Scattered BILATERAL pulmonary nodules, little changed since 03/28/2015; recommend assessment on follow-up imaging.   Electronically Signed   By: Ulyses Southward M.D.   On: 04/16/2015 17:49     EKG Interpretation   Date/Time:  Tuesday April 16 2015 15:17:56 EDT Ventricular Rate:  105 PR Interval:  140 QRS Duration: 75 QT Interval:  306 QTC Calculation: 404 R Axis:   73 Text Interpretation:  Sinus tachycardia ST elev, probable normal early  repol pattern agree. no sig change c/w prior Confirmed by Donnald Garre, MD,  Lebron Conners [phone]) on 04/16/2015 4:20:31 PM      MDM   Final diagnoses:  Empyema lung  Lung mass  Bullous emphysema  HIV (human immunodeficiency virus infection)  Post-procedural fever   Patient has been febrile since a bronchoscopy to further evaluate a lung mass. There is associated fluid layering on the CT scan. Biopsies and cultures reviewed by myself in the electronic medical record did not show specific etiology at this point. However with the patient HIV positive, having a large consolidated focus of lung parenchyma and adjacent layering fluid, I felt that he needed IV antibiotic therapy for a possible empyema and as of yet unspecified infectious pulmonary consolidation.    Arby Barrette, MD 04/16/15 [phone]

## 2015-04-17 ENCOUNTER — Encounter (HOSPITAL_COMMUNITY): Payer: Self-pay | Admitting: General Practice

## 2015-04-17 DIAGNOSIS — J189 Pneumonia, unspecified organism: Secondary | ICD-10-CM

## 2015-04-17 DIAGNOSIS — R918 Other nonspecific abnormal finding of lung field: Secondary | ICD-10-CM

## 2015-04-17 DIAGNOSIS — J869 Pyothorax without fistula: Secondary | ICD-10-CM

## 2015-04-17 DIAGNOSIS — R509 Fever, unspecified: Secondary | ICD-10-CM

## 2015-04-17 DIAGNOSIS — Z21 Asymptomatic human immunodeficiency virus [HIV] infection status: Secondary | ICD-10-CM

## 2015-04-17 DIAGNOSIS — J181 Lobar pneumonia, unspecified organism: Secondary | ICD-10-CM | POA: Insufficient documentation

## 2015-04-17 DIAGNOSIS — R0781 Pleurodynia: Secondary | ICD-10-CM

## 2015-04-17 DIAGNOSIS — J439 Emphysema, unspecified: Secondary | ICD-10-CM | POA: Insufficient documentation

## 2015-04-17 DIAGNOSIS — A419 Sepsis, unspecified organism: Principal | ICD-10-CM | POA: Diagnosis not present

## 2015-04-17 LAB — LEGIONELLA PNEUMOPHILA TOTAL AB: Serogroup 1: <1:16 {titer}

## 2015-04-17 LAB — BASIC METABOLIC PANEL
ANION GAP: 7 (ref 5–15)
BUN: 8 mg/dL (ref 6–23)
CALCIUM: 8.4 mg/dL (ref 8.4–10.5)
CHLORIDE: 105 mmol/L (ref 96–112)
CO2: 25 mmol/L (ref 19–32)
Creatinine, Ser: 1.18 mg/dL (ref 0.50–1.35)
GFR calc Af Amer: >90 mL/min (ref >90)
GFR, EST NON AFRICAN AMERICAN: 79 mL/min — AB (ref >90)
Glucose, Bld: 111 mg/dL — ABNORMAL HIGH (ref 70–99)
POTASSIUM: 4 mmol/L (ref 3.5–5.1)
Sodium: 137 mmol/L (ref 135–145)

## 2015-04-17 LAB — CBC WITH DIFFERENTIAL/PLATELET
BASOS PCT: 0 % (ref 0–1)
Basophils Absolute: 0 10*3/uL (ref 0.0–0.1)
Eosinophils Absolute: 0 10*3/uL (ref 0.0–0.7)
Eosinophils Relative: 0 % (ref 0–5)
HEMATOCRIT: 32.1 % — AB (ref 39.0–52.0)
Hemoglobin: 11.7 g/dL — ABNORMAL LOW (ref 13.0–17.0)
Lymphocytes Relative: 12 % (ref 12–46)
Lymphs Abs: 1.4 10*3/uL (ref 0.7–4.0)
MCH: 31.8 pg (ref 26.0–34.0)
MCHC: 36.4 g/dL — AB (ref 30.0–36.0)
MCV: 87.2 fL (ref 78.0–100.0)
MONO ABS: 1.4 10*3/uL — AB (ref 0.1–1.0)
MONOS PCT: 13 % — AB (ref 3–12)
Neutro Abs: 8.2 10*3/uL — ABNORMAL HIGH (ref 1.7–7.7)
Neutrophils Relative %: 75 % (ref 43–77)
Platelets: 161 10*3/uL (ref 150–400)
RBC: 3.68 MIL/uL — ABNORMAL LOW (ref 4.22–5.81)
RDW: 13.6 % (ref 11.5–15.5)
WBC: 11.1 10*3/uL — AB (ref 4.0–10.5)

## 2015-04-17 LAB — PROCALCITONIN: Procalcitonin: 0.28 ng/mL

## 2015-04-17 LAB — LACTIC ACID, PLASMA: LACTIC ACID, VENOUS: 0.7 mmol/L (ref 0.5–2.0)

## 2015-04-17 LAB — INFLUENZA PANEL BY PCR (TYPE A & B)
H1N1FLUPCR: NOT DETECTED
Influenza A By PCR: NEGATIVE
Influenza B By PCR: NEGATIVE

## 2015-04-17 MED ORDER — ENSURE ENLIVE PO LIQD
237.0000 mL | Freq: Two times a day (BID) | ORAL | Status: DC
  Administered 2015-04-17 – 2015-04-22 (×9): 237 mL via ORAL

## 2015-04-17 MED ORDER — CEFIXIME 400 MG PO TABS
400.0000 mg | ORAL_TABLET | Freq: Every day | ORAL | Status: DC
  Administered 2015-04-17: 400 mg via ORAL
  Filled 2015-04-17: qty 1

## 2015-04-17 MED ORDER — ONDANSETRON HCL 4 MG/2ML IJ SOLN
4.0000 mg | Freq: Four times a day (QID) | INTRAMUSCULAR | Status: DC | PRN
  Administered 2015-04-17 – 2015-04-21 (×3): 4 mg via INTRAVENOUS
  Filled 2015-04-17 (×4): qty 2

## 2015-04-17 MED ORDER — SODIUM CHLORIDE 0.9 % IV SOLN
3.0000 g | Freq: Four times a day (QID) | INTRAVENOUS | Status: DC
  Administered 2015-04-17 – 2015-04-19 (×7): 3 g via INTRAVENOUS
  Filled 2015-04-17 (×10): qty 3

## 2015-04-17 MED ORDER — SODIUM CHLORIDE 0.9 % IV SOLN
INTRAVENOUS | Status: DC
  Administered 2015-04-17 – 2015-04-22 (×6): via INTRAVENOUS
  Administered 2015-04-22: 1000 mL via INTRAVENOUS
  Administered 2015-04-23: 06:00:00 via INTRAVENOUS

## 2015-04-17 MED ORDER — IBUPROFEN 200 MG PO TABS
200.0000 mg | ORAL_TABLET | Freq: Four times a day (QID) | ORAL | Status: DC | PRN
  Administered 2015-04-17 – 2015-04-19 (×5): 200 mg via ORAL
  Filled 2015-04-17 (×5): qty 1

## 2015-04-17 NOTE — Consult Note (Signed)
[facility]     Reason for Consult: HIV, pulmonary mass    Referring Physician: Dr. Larkin Ina  Principal Problem:   PNA (pneumonia) Active Problems:   Fever   Lung mass   HIV (human immunodeficiency virus infection)   Hyperkalemia   Empyema lung   . cefTRIAXone (ROCEPHIN)  IV  1 g Intravenous Q24H  . dolutegravir  50 mg Oral Daily  . emtricitabine-tenofovir  1 tablet Oral Daily  . enoxaparin (LOVENOX) injection  40 mg Subcutaneous Q24H    Recommendations: Can continue with cefixime oral daily for 2 more days Consider alerting Dr. Kendrick Fries or his partner of his admission and significant change in mass size Continue with ibuprofen  Assessment: He has pleuritic chest pain c/w pleuritis.  Also with cough and sputum production and possible bronchitis/copd.    Antibiotics: Vancomycin and zosyn x 1 dose ceftriaxone  HPI: Rodney Walker is a 34 y.o. male with HIV followed by Dr. Drue Second well controlled on dolutegravir and Truvada and also with RUL pulmonary mass.  He previously had a biopsy with granulomatous inflammation but everything has been negative.  Underwent bronchoscopy 4/13 including washings and biopsy with no positive results to date.  He has had weight loss.  He came in to ED with back and pleuritic chest pain that has been present since procedure, though worsening.  He also developed fever and was 102 on admission.  WBC 9.5 and now 11.  Some productive cough with clear sputum.  He called Dr. Kendrick Fries after ED visit and started on doxycycline, but did not fill since he could not afford it.  Came back to ED yesterday mainly with complaint of back pain, particularly right upper back.     Review of Systems: A comprehensive review of systems was negative.  Past Medical History  Diagnosis Date  . Chest pain 2010  . Hyperlipidemia   . Coronary artery disease     SPONTANEOUS DISSECTION AND PLAQUE RUPTURE OF HIS LEFT ANTERIOR  DESCENDING ARTERY  . MI (myocardial infarction)   . HIV infection     History  Substance Use Topics  . Smoking status: Current Every Day Smoker -- 0.25 packs/day for 17 years    Types: Cigarettes    Last Attempt to Quit: 03/22/2014  . Smokeless tobacco: Never Used     Comment: has cut back significantly, 3 cigs/day.  03/19/15  . Alcohol Use: No    Family History  Problem Relation Age of Onset  . Diabetes Mother   . Liver cancer Father    No Known Allergies  OBJECTIVE: Blood pressure 116/66, pulse 98, temperature 99.8 F (37.7 C), temperature source Oral, resp. rate 26, height 5\' 4"  (1.626 m), weight 141 lb 1.6 oz (64.003 kg), SpO2 93 %. General: awake, alert, nad, no respiratory distress, some shallow breaths due to pain Skin: no rashes Lungs: CTA B Cor: RRR Abdomen: soft, nt, nd Ext: no edema  Microbiology: Recent Results (from the past 240 hour(s))  AFB culture with smear     Status: None (Preliminary result)   Collection Time: 04/10/15  8:45 AM  Result Value Ref Range Status   Specimen Description BRONCHIAL ALVEOLAR LAVAGE  Final   Special Requests Immunocompromised  Final   Acid Fast Smear   Final    NO ACID FAST BACILLI SEEN Performed at [facility]    Culture   Final    CULTURE WILL BE EXAMINED FOR 6 WEEKS BEFORE ISSUING A  FINAL REPORT Performed at [facility]    Report Status PENDING  Incomplete  Fungus Culture with Smear     Status: None (Preliminary result)   Collection Time: 04/10/15  8:45 AM  Result Value Ref Range Status   Specimen Description BRONCHIAL ALVEOLAR LAVAGE  Final   Special Requests Immunocompromised  Final   Fungal Smear   Final    NO YEAST OR FUNGAL ELEMENTS SEEN Performed at [facility]    Culture   Final    CULTURE IN PROGRESS FOR FOUR WEEKS Performed at [facility]    Report Status PENDING  Incomplete  Culture, respiratory (NON-Expectorated)     Status: None   Collection Time: 04/10/15  8:45  AM  Result Value Ref Range Status   Specimen Description BRONCHIAL ALVEOLAR LAVAGE  Final   Special Requests Immunocompromised  Final   Gram Stain   Final    NO WBC SEEN RARE SQUAMOUS EPITHELIAL CELLS PRESENT RARE GRAM POSITIVE COCCI IN PAIRS IN CLUSTERS Performed at [facility]    Culture   Final    Non-Pathogenic Oropharyngeal-type Flora Isolated. Performed at [facility]    Report Status 04/12/2015 FINAL  Final    Staci Righter, MD [email or web address] [phone] pager  [phone] cell 04/17/2015, 10:33 AM

## 2015-04-17 NOTE — Care Management Note (Unsigned)
    Page 1 of 1   04/18/2015     11:09:31 AM CARE MANAGEMENT NOTE 04/18/2015  Patient:  Rodney Walker, Rodney Walker   Account Number:  [id number]  Date Initiated:  04/17/2015  Documentation initiated by:  Letha Cape  Subjective/Objective Assessment:   dx fever, back pain, 042.  admit- from home with partner.  Patient has orange card. He is also in th [facility] program at [employer].     Action/Plan:   Anticipated DC Date:  04/19/2015   Anticipated DC Plan:  HOME/SELF CARE      DC Planning Services  CM consult  Follow-up appt scheduled      Choice offered to / List presented to:             Status of service:  In process, will continue to follow Medicare Important Message given?  NO (If response is "NO", the following Medicare IM given date fields will be blank) Date Medicare IM given:   Medicare IM given by:   Date Additional Medicare IM given:   Additional Medicare IM given by:    Discharge Disposition:    Per UR Regulation:    If discussed at Long Length of Stay Meetings, dates discussed:    Comments:  04/18/15 1107 Letha Cape RN, BSN [phone] patient has follow up with [facility] on 4/26 at 9 am. Patint also goes to [facility] clinic.  04/17/15 1125 Letha Cape RN BSN [phone] patient has an orange card for meds  and he follows up with ID , he states he has transportation at dc.  NCM will cont to follow for dc needs.

## 2015-04-17 NOTE — Progress Notes (Signed)
Utilization review completed. Izaan Kingbird, RN, BSN. 

## 2015-04-17 NOTE — Progress Notes (Signed)
ANTIBIOTIC CONSULT NOTE - INITIAL  Pharmacy Consult for Unasyn Indication: CAP  No Known Allergies  Patient Measurements: Height: 5\' 4"  (162.6 cm) Weight: 141 lb 1.6 oz (64.003 kg) IBW/kg (Calculated) : 59.2 Adjusted Body Weight:    Vital Signs: Temp: 102.9 F (39.4 C) (04/20 1451) Temp Source: Oral (04/20 1451) BP: 125/65 mmHg (04/20 1451) Pulse Rate: 100 (04/20 1451) Intake/Output from previous day: 04/19 0701 - 04/20 0700 In: 1021.7 [P.O.:100; I.V.:871.7; IV Piggyback:50] Out: 930 [Urine:930] Intake/Output from this shift:    Labs:  Recent Labs  04/14/15 1746 04/16/15 1514 04/17/15 0720  WBC 10.7* 9.5 11.1*  HGB 12.8* 11.7* 11.7*  PLT 208 199 161  CREATININE 1.09 1.13 1.18   Estimated Creatinine Clearance: 73.9 mL/min (by C-G formula based on Cr of 1.18). No results for input(s): VANCOTROUGH, VANCOPEAK, VANCORANDOM, GENTTROUGH, GENTPEAK, GENTRANDOM, TOBRATROUGH, TOBRAPEAK, TOBRARND, AMIKACINPEAK, AMIKACINTROU, AMIKACIN in the last 72 hours.   Microbiology:   Medical History: Past Medical History  Diagnosis Date  . Chest pain 2010  . Hyperlipidemia   . Coronary artery disease     SPONTANEOUS DISSECTION AND PLAQUE RUPTURE OF HIS LEFT ANTERIOR DESCENDING ARTERY  . MI (myocardial infarction)   . HIV infection   . Empyema lung   . Emphysema lung     Medications:  Prescriptions prior to admission  Medication Sig Dispense Refill Last Dose  . albuterol (PROVENTIL HFA;VENTOLIN HFA) 108 (90 BASE) MCG/ACT inhaler Inhale 2 puffs into the lungs every 6 (six) hours as needed for wheezing or shortness of breath. 1 Inhaler 1 04/16/2015 at Unknown time  . aspirin 325 MG tablet Take 650 mg by mouth daily as needed for fever.   04/16/2015 at Unknown time  . dolutegravir (TIVICAY) 50 MG tablet Take 1 tablet (50 mg total) by mouth daily. 30 tablet 11 04/15/2015 at Unknown time  . doxycycline (VIBRA-TABS) 100 MG tablet Take 1 tablet (100 mg total) by mouth 2 (two) times  daily. 10 tablet 0   . emtricitabine-tenofovir (TRUVADA) 200-300 MG per tablet Take 1 tablet by mouth daily. 30 tablet 5 04/15/2015 at Unknown time  . ibuprofen (ADVIL,MOTRIN) 200 MG tablet Take 200 mg by mouth every 6 (six) hours as needed (pain).   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  ID: PNA/HIV (Tivicay/Truvada). Rocephin>>Cefepime>>Unasyn per ID recommendation. Tmax 102.9. WBC 11.1 up today. Crcl 74. Cultures sent.  Goal of Therapy:  Eradication of infection.  Plan:  Unasyn 3g IV q6hr.   Etha Stambaugh S. Merilynn Finland, PharmD, BCPS Clinical Staff Pharmacist Pager [phone]  Misty Stanley Stillinger 04/17/2015,4:59 PM

## 2015-04-17 NOTE — Progress Notes (Addendum)
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   Assessment/Plan: Sepsis secondary to postobstructive PNA with increased right upper lobe lung mass Patient febrile with mild leukocytosis. Was placed on empiric Vanco and Zosyn on admission and transitioned to cephalosporin per ID. strep pneumonia antigen negative. Follow  blood culture, Urine legionella antigen .Repeat lactic acid. Appreciate pulmonary consult who recommends that further bronchoscopy may be nondiagnostic while CT-guided transthoracic needle aspiration biopsy has risk with his bullous disease. Recommend empiric antibiotic for 3-6 weeks and reassess with repeat chest CT. If unimproved or worse then would need resection by thoracic surgery. -Discussed again with Dr. Luciana Axe. Abx switched to  unasyn and monitor. Supportive care with antitussives and Tylenol. -follow AFB cx from recent bx   Emphysema of rt upper lobe  secondary to ongoing tobacco and marijuana use. Canceled on cessation.  HIV Continue ART. Follows with Dr. Ilsa Iha.   Hyperkalemia resolved  DVT prophylaxis  Diet: Regular  Code Status: Full code Family Communication: None at bedside Disposition Plan: Continue inpatient. Home on symptoms improved and afebrile   Consultants:  Pulmonary  ID  Procedures:  None  Antibiotics:  IV vancomycin and Zosyn x1  Augmentin 4/20--  HPI/Subjective: Patient seen and examined. c/o of nausea and had  one episode of vomiting. Complains of right-sided pleuritic chest pain.  Objective: Filed Vitals:   04/17/15 1451  BP: 125/65  Pulse: 100  Temp: 102.9 F (39.4 C)  Resp: 20    Intake/Output Summary (Last 24 hours) at 04/17/15 1646 Last data filed at 04/17/15 0631  Gross per 24 hour  Intake 1021.67 ml  Output    930 ml  Net  91.67 ml   Filed Weights   04/16/15 1455 04/16/15 2009  Weight: 59.875 kg (132 lb) 64.003 kg (141 lb 1.6 oz)    Exam:   General: Middle aged male in no acute distress  HEENT: No pallor, moist oral mucosa, supple neck  Chest: Diminished breath bilaterally, no added sounds  CVS: Normal S1 and S2, no murmur or gallop  GI: Soft, nondistended, nontender, bowel sounds present  Musculoskeletal: Warm, no edema  CNS: Alert and oriented  Data Reviewed: Basic Metabolic Panel:  Recent Labs Lab 04/14/15 1746 04/16/15 1514 04/17/15 0720  NA 133* 133* 137  K 3.3* 5.6* 4.0  CL 97 101 105  CO2 GLUCOSE 95 113* 111*  BUN CREATININE 1.09 1.13 1.18  CALCIUM 8.6 8.5 8.4   Liver Function Tests:  Recent Labs Lab 04/16/15 1514  AST 54*  ALT 40  ALKPHOS 79  BILITOT 2.5*  PROT 6.7  ALBUMIN 2.7*   No results for input(s): LIPASE, AMYLASE in the last 168 hours. No results for input(s): AMMONIA in the last 168 hours. CBC:  Recent Labs Lab 04/14/15 1746 04/16/15 1514 04/17/15 0720  WBC 10.7* 9.5 11.1*  NEUTROABS  --  8.1* 8.2*  HGB 12.8* 11.7* 11.7*  HCT 35.2* 32.5* 32.1*  MCV 86.9 88.6 87.2  PLT 208 199 161  Cardiac Enzymes: No results for input(s): CKTOTAL, CKMB, CKMBINDEX, TROPONINI in the last 168 hours. BNP (last 3 results) No results for input(s): BNP in the last 8760 hours.  ProBNP (last 3 results) No results for input(s): PROBNP in the last 8760 hours.  CBG: No results for input(s): GLUCAP in the last 168 hours.  Recent Results (from the past 240 hour(s))  AFB culture with smear     Status: None  (Preliminary result)   Collection Time: 04/10/15  8:45 AM  Result Value Ref Range Status   Specimen Description BRONCHIAL ALVEOLAR LAVAGE  Final   Special Requests Immunocompromised  Final   Acid Fast Smear   Final    NO ACID FAST BACILLI SEEN Performed at [facility]    Culture   Final    CULTURE WILL BE EXAMINED FOR 6 WEEKS BEFORE ISSUING A FINAL REPORT Performed at [facility]    Report Status PENDING  Incomplete  Fungus Culture with Smear     Status: None (Preliminary result)   Collection Time: 04/10/15  8:45 AM  Result Value Ref Range Status   Specimen Description BRONCHIAL ALVEOLAR LAVAGE  Final   Special Requests Immunocompromised  Final   Fungal Smear   Final    NO YEAST OR FUNGAL ELEMENTS SEEN Performed at [facility]    Culture   Final    CULTURE IN PROGRESS FOR FOUR WEEKS Performed at [facility]    Report Status PENDING  Incomplete  Culture, respiratory (NON-Expectorated)     Status: None   Collection Time: 04/10/15  8:45 AM  Result Value Ref Range Status   Specimen Description BRONCHIAL ALVEOLAR LAVAGE  Final   Special Requests Immunocompromised  Final   Gram Stain   Final    NO WBC SEEN RARE SQUAMOUS EPITHELIAL CELLS PRESENT RARE GRAM POSITIVE COCCI IN PAIRS IN CLUSTERS Performed at [facility]    Culture   Final    Non-Pathogenic Oropharyngeal-type Flora Isolated. Performed at [facility]    Report Status 04/12/2015 FINAL  Final     Studies: Dg Chest 2 View  04/16/2015   CLINICAL DATA:  Right upper back pain, cough and fever, HIV infection  EXAM: CHEST  2 VIEW  COMPARISON:  04/14/2015 and 03/28/2015  FINDINGS: Cardiomediastinal silhouette is stable. Again noted significant bullous changes bilateral upper lobe. Persistent masslike opacity in right upper lobe which is stable. Measures about 3.4 cm. There is new air-fluid level in right suprahilar region. Layering fluid within emphysematous bullae  suspected. Infected fluid cannot be excluded. Clinical correlation is necessary. Left lung is clear.  IMPRESSION: Again noted significant bullous changes bilateral upper lobe. Persistent masslike opacity in right upper lobe which is stable. Measures about 3.4 cm. There is new air-fluid level in right suprahilar region. Layering fluid within emphysematous bullae suspected. Infected fluid cannot be excluded. Clinical correlation is necessary.   Electronically Signed   By: Natasha Mead M.D.   On: 04/16/2015 16:11   Ct Chest W Contrast  04/16/2015   CLINICAL DATA:  Shortness of breath, chest pain, upper back pain, fever, cough, history coronary artery disease, HIV  EXAM: CT CHEST WITH CONTRAST  TECHNIQUE: Multidetector CT imaging of the chest was performed during intravenous contrast administration. Sagittal and coronal MPR images reconstructed from axial data set.  CONTRAST:  80mL OMNIPAQUE IOHEXOL 300 MG/ML  SOLN IV  COMPARISON:  03/28/2015  FINDINGS: Thoracic vascular structures grossly patent on nondedicated exam.  Minimally enlarged  RIGHT peritracheal lymph node 11 mm short axis image 14 little changed.  Multiple additional normal size mediastinal lymph nodes identified.  Visualized portion of upper abdomen unremarkable.  Severe bullous disease RIGHT apex with scattered emphysematous changes in both upper lobes.  Persistent masslike opacity in RIGHT upper lobe 4.8 x 4.6 cm image 21 previously 3.5 x 2.5 cm.  Several small adjacent nodular foci are identified with additional postobstructive pneumonitis in the RIGHT upper lobe.  Stable small nonspecific nodular foci in both lungs.  Dependent atelectasis in the lower lobes bilaterally.  No pulmonary infiltrate, pleural effusion or pneumothorax.  Osseous structures unremarkable.  IMPRESSION: Increase in size of mass-like opacity in RIGHT upper lobe now measuring 4.8 x 4.6 cm, worrisome for malignancy.  Postobstructive pneumonitis RIGHT upper lobe.  Severe emphysematous  changes with bullous disease particularly at RIGHT apex.  Scattered BILATERAL pulmonary nodules, little changed since 03/28/2015; recommend assessment on follow-up imaging.   Electronically Signed   By: Ulyses Southward M.D.   On: 04/16/2015 17:49    Scheduled Meds: . cefixime  400 mg Oral Daily  . dolutegravir  50 mg Oral Daily  . emtricitabine-tenofovir  1 tablet Oral Daily  . enoxaparin (LOVENOX) injection  40 mg Subcutaneous Q24H  . feeding supplement (ENSURE ENLIVE)  237 mL Oral BID BM   Continuous Infusions:     Time spent: 35 minutes    Deric Bocock  [facility] Pager [phone]. If 7PM-7AM, please contact night-coverage at [email or web address], password [facility] 04/17/2015, 4:46 PM  LOS: 1 day

## 2015-04-17 NOTE — Consult Note (Signed)
Name: Rodney Walker MRN: [id number] DOB: 12/16/1981    ADMISSION DATE:  04/16/2015 CONSULTATION DATE:  04/17/15  REFERRING MD :  Dr. 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  PCCM consulted for evaluation of mass.    SIGNIFICANT EVENTS  Fall 2015  >> Dx with RUL mass like opacity  09/05/14  FOB >> per Dr. Sung Amabile, fungating mass RUL, bx c/w inflammation, granulomatous inflammation on brushing 04/10/15  FOB >> per Dr. Kendrick Fries, benign lung tissue w/o evidence of malignancy 04/15/15  Seen in ER for fevers, cough, SOB, Rx'd with doxycycline x 5 days from Dr. 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    The patient has undergone a bronchoscopy in 08/2014 which was negative for malignancy but showed granulomatous inflammation on brushings.  The area was followed as an outpatient.  Cultures were negative.  He underwent a repeat FOB 04/10/15 which showed benign lung tissue without evidence of malignancy. Repeat cultures / AFB were also negative (to date).    The patient called the pulmonary office on 4/18 with cough, fevers up to 102, chills.  He was prescribed doxycycline by Dr. Kendrick Fries but was unable to fill the medication due to cost.  He developed worsening of chest pain and was seen in the ER on 4/19.  He  reported worsening of cough, non-sputum producing, pleuritic right sided chest pain, fevers, chills, nausea, and shortness of breath.  The patient was admitted per [facility] for further evaluation.  Repeat CT scan of the chest showed an increase in RUL mass-like opacity.  PCCM consulted for further evaluation.     PAST MEDICAL HISTORY :   has a past medical history of Chest pain (2010); Hyperlipidemia; Coronary artery disease; MI (myocardial infarction); HIV infection; and Empyema lung.  has past surgical history that includes Cardiac catheterization (06/24/2009); Cardiac catheterization (06/17/2009); US echocardiography (12/09/2009); Video bronchoscopy (Bilateral, 09/05/2014); and Video bronchoscopy (Bilateral, 04/10/2015).    Prior to Admission medications   Medication Sig Start Date End Date Taking? Authorizing Provider  albuterol (PROVENTIL HFA;VENTOLIN HFA) 108 (90 BASE) MCG/ACT inhaler Inhale 2 puffs into the lungs every 6 (six) hours as needed for wheezing or shortness of breath. 07/25/14  Yes Randall Hiss, MD  aspirin 325 MG tablet Take 650 mg by mouth daily as needed for fever.   Yes Historical Provider, MD  dolutegravir (TIVICAY) 50 MG tablet Take 1 tablet (50 mg total) by mouth daily. 09/04/14  Yes Gardiner Barefoot, MD  doxycycline (VIBRA-TABS) 100 MG tablet Take 1 tablet (100 mg total) by mouth 2 (two) times daily. 04/15/15  Yes Lupita Leash, MD  emtricitabine-tenofovir (TRUVADA) 200-300 MG per tablet Take 1 tablet by mouth daily. 09/04/14  Yes Gardiner Barefoot, MD  ibuprofen (ADVIL,MOTRIN) 200 MG tablet Take 200 mg  by mouth every 6 (six) hours as needed (pain).   Yes Historical Provider, MD   No Known Allergies  FAMILY HISTORY:  family history includes Diabetes in his mother; Liver cancer in his father.   SOCIAL HISTORY:  reports that he has been smoking Cigarettes.  He has a 4.25 pack-year smoking history. He has never used smokeless tobacco. He reports that he uses illicit drugs  (Marijuana) about 4 times per week. He reports that he does not drink alcohol.  REVIEW OF SYSTEMS:   Constitutional: Negative for fever, chills, weight loss, malaise/fatigue and diaphoresis.  HENT: Negative for hearing loss, ear pain, nosebleeds, congestion, sore throat, neck pain, tinnitus and ear discharge.   Eyes: Negative for blurred vision, double vision, photophobia, pain, discharge and redness.  Respiratory: Negative for hemoptysis, sputum production, shortness of breath, wheezing and stridor.  Pt reports right sided sharp chest pain, pain into his back, cough without sputum production, shortness of breath.   Cardiovascular: Negative for palpitations, orthopnea, claudication, leg swelling and PND.  Gastrointestinal: Negative for heartburn, nausea, vomiting, abdominal pain, diarrhea, constipation, blood in stool and melena.  Genitourinary: Negative for dysuria, urgency, frequency, hematuria and flank pain.  Musculoskeletal: Negative for myalgias, back pain, joint pain and falls.  Skin: Negative for itching and rash.  Neurological: Negative for dizziness, tingling, tremors, sensory change, speech change, focal weakness, seizures, loss of consciousness, weakness and headaches.  Endo/Heme/Allergies: Negative for environmental allergies and polydipsia. Does not bruise/bleed easily.  SUBJECTIVE:   VITAL SIGNS: Temp:  [97.7 F (36.5 C)-102.5 F (39.2 C)] 99.8 F (37.7 C) (04/20 0340) Pulse Rate:  [84-102] 98 (04/20 0340) Resp:  [15-29] 26 (04/20 0340) BP: (107-154)/(53-73) 116/66 mmHg (04/20 0340) SpO2:  [93 %-99 %] 93 % (04/20 0340) Weight:  [132 lb (59.875 kg)-141 lb 1.6 oz (64.003 kg)] 141 lb 1.6 oz (64.003 kg) (04/19 2009)  PHYSICAL EXAMINATION: General:  Think adult male in NAD Neuro:  AAOx4, speech clear, MAE   HEENT:  Mm pink/moist, no jvd Cardiovascular:  s1s2 rrr, no m/r/g Lungs:  resp's even/non-labored, lungs bilaterally diminished  Abdomen:  Flat, soft, bsx4 active.   Mild tenderness to palpation Musculoskeletal:  No acute deformities  Skin:  Warm/dry, no edema    Recent Labs Lab 04/14/15 1746 04/16/15 1514 04/17/15 0720  NA 133* 133* 137  K 3.3* 5.6* 4.0  CL 97 101 105  CO2 BUN CREATININE 1.09 1.13 1.18  GLUCOSE 95 113* 111*    Recent Labs Lab 04/14/15 1746 04/16/15 1514 04/17/15 0720  HGB 12.8* 11.7* 11.7*  HCT 35.2* 32.5* 32.1*  WBC 10.7* 9.5 11.1*  PLT 208 199 161   Dg Chest 2 View  04/16/2015   CLINICAL DATA:  Right upper back pain, cough and fever, HIV infection  EXAM: CHEST  2 VIEW  COMPARISON:  04/14/2015 and 03/28/2015  FINDINGS: Cardiomediastinal silhouette is stable. Again noted significant bullous changes bilateral upper lobe. Persistent masslike opacity in right upper lobe which is stable. Measures about 3.4 cm. There is new air-fluid level in right suprahilar region. Layering fluid within emphysematous bullae suspected. Infected fluid cannot be excluded. Clinical correlation is necessary. Left lung is clear.  IMPRESSION: Again noted significant bullous changes bilateral upper lobe. Persistent masslike opacity in right upper lobe which is stable. Measures about 3.4 cm. There is new air-fluid level in right suprahilar region. Layering fluid within emphysematous bullae suspected. Infected fluid cannot be excluded. Clinical correlation is necessary.  Electronically Signed   By: Natasha Mead M.D.   On: 04/16/2015 16:11   Ct Chest W Contrast  04/16/2015   CLINICAL DATA:  Shortness of breath, chest pain, upper back pain, fever, cough, history coronary artery disease, HIV  EXAM: CT CHEST WITH CONTRAST  TECHNIQUE: Multidetector CT imaging of the chest was performed during intravenous contrast administration. Sagittal and coronal MPR images reconstructed from axial data set.  CONTRAST:  50mL OMNIPAQUE IOHEXOL 300 MG/ML  SOLN IV  COMPARISON:  03/28/2015  FINDINGS: Thoracic vascular structures grossly patent on nondedicated  exam.  Minimally enlarged RIGHT peritracheal lymph node 11 mm short axis image 14 little changed.  Multiple additional normal size mediastinal lymph nodes identified.  Visualized portion of upper abdomen unremarkable.  Severe bullous disease RIGHT apex with scattered emphysematous changes in both upper lobes.  Persistent masslike opacity in RIGHT upper lobe 4.8 x 4.6 cm image 21 previously 3.5 x 2.5 cm.  Several small adjacent nodular foci are identified with additional postobstructive pneumonitis in the RIGHT upper lobe.  Stable small nonspecific nodular foci in both lungs.  Dependent atelectasis in the lower lobes bilaterally.  No pulmonary infiltrate, pleural effusion or pneumothorax.  Osseous structures unremarkable.  IMPRESSION: Increase in size of mass-like opacity in RIGHT upper lobe now measuring 4.8 x 4.6 cm, worrisome for malignancy.  Postobstructive pneumonitis RIGHT upper lobe.  Severe emphysematous changes with bullous disease particularly at RIGHT apex.  Scattered BILATERAL pulmonary nodules, little changed since 03/28/2015; recommend assessment on follow-up imaging.   Electronically Signed   By: Ulyses Southward M.D.   On: 04/16/2015 17:49    ASSESSMENT / PLAN:  RUL Mass-like Opacity - patient has been followed by PCCM for mass like opacity since September 2015. He has undergone two FOB's with negative biopsy / brushings for malignancy, granulomatous inflammation seen on endobronchial brushing.  AFB's have also been negative. Given rapid increase in size since March 2016 and inflammation seen on brushings, this favors inflammatory or infectious etiology.  However, malignancy must be considered given HIV status.    Emphysema - upper lobe predominant, suspect related to heavy marijuana usage/smoking.     Plan: -Complete prolonged duration of abx, suggest 4 weeks abx.  Cost will be a factor for patient.   -After abx completed, proceed with a follow up CT scan of chest.  If he worsens or CT  advances, he will need CVTS evaluation for resection.  Needle biopsy / TBB or mediastinoscopy would be very high risk given emphysema.   -No role for a third bronchoscopy -Smoking / marijuana cessation  -ensure compliance with HIV regimen -follow pending AFB's -needs social work consult for access to medical care issues  Canary Brim, NP-C [facility] Pgr: [phone] or [phone]   04/17/2015, 12:58 PM

## 2015-04-18 ENCOUNTER — Other Ambulatory Visit: Payer: Self-pay | Admitting: Infectious Disease

## 2015-04-18 DIAGNOSIS — J439 Emphysema, unspecified: Secondary | ICD-10-CM

## 2015-04-18 DIAGNOSIS — J181 Lobar pneumonia, unspecified organism: Secondary | ICD-10-CM

## 2015-04-18 LAB — URINE CULTURE
COLONY COUNT: NO GROWTH
CULTURE: NO GROWTH

## 2015-04-18 LAB — BORDETELLA PERTUSSIS PCR
B PERTUSSIS, DNA: NEGATIVE
B parapertussis, DNA: NEGATIVE

## 2015-04-18 LAB — CBC WITH DIFFERENTIAL/PLATELET
BASOS ABS: 0 10*3/uL (ref 0.0–0.1)
Basophils Relative: 0 % (ref 0–1)
EOS ABS: 0 10*3/uL (ref 0.0–0.7)
EOS PCT: 0 % (ref 0–5)
HCT: 31.6 % — ABNORMAL LOW (ref 39.0–52.0)
Hemoglobin: 11.7 g/dL — ABNORMAL LOW (ref 13.0–17.0)
Lymphocytes Relative: 9 % — ABNORMAL LOW (ref 12–46)
Lymphs Abs: 1.2 10*3/uL (ref 0.7–4.0)
MCH: 32.2 pg (ref 26.0–34.0)
MCHC: 37 g/dL — AB (ref 30.0–36.0)
MCV: 87.1 fL (ref 78.0–100.0)
Monocytes Absolute: 1.3 10*3/uL — ABNORMAL HIGH (ref 0.1–1.0)
Monocytes Relative: 10 % (ref 3–12)
Neutro Abs: 10.8 10*3/uL — ABNORMAL HIGH (ref 1.7–7.7)
Neutrophils Relative %: 81 % — ABNORMAL HIGH (ref 43–77)
PLATELETS: 159 10*3/uL (ref 150–400)
RBC: 3.63 MIL/uL — ABNORMAL LOW (ref 4.22–5.81)
RDW: 13.5 % (ref 11.5–15.5)
WBC: 13.3 10*3/uL — ABNORMAL HIGH (ref 4.0–10.5)

## 2015-04-18 LAB — BASIC METABOLIC PANEL
ANION GAP: 11 (ref 5–15)
BUN: 9 mg/dL (ref 6–23)
CALCIUM: 8.1 mg/dL — AB (ref 8.4–10.5)
CHLORIDE: 101 mmol/L (ref 96–112)
CO2: 22 mmol/L (ref 19–32)
CREATININE: 1.15 mg/dL (ref 0.50–1.35)
GFR calc Af Amer: >90 mL/min (ref >90)
GFR calc non Af Amer: 82 mL/min — ABNORMAL LOW (ref >90)
Glucose, Bld: 118 mg/dL — ABNORMAL HIGH (ref 70–99)
Potassium: 3.5 mmol/L (ref 3.5–5.1)
Sodium: 134 mmol/L — ABNORMAL LOW (ref 135–145)

## 2015-04-18 LAB — RESPIRATORY VIRUS PANEL
Adenovirus: NEGATIVE
INFLUENZA A: NEGATIVE
Influenza B: NEGATIVE
Metapneumovirus: NEGATIVE
PARAINFLUENZA 2 A: NEGATIVE
PARAINFLUENZA 3 A: NEGATIVE
Parainfluenza 1: NEGATIVE
RHINOVIRUS: NEGATIVE
Respiratory Syncytial Virus A: NEGATIVE
Respiratory Syncytial Virus B: NEGATIVE

## 2015-04-18 LAB — LEGIONELLA ANTIGEN, URINE

## 2015-04-18 MED ORDER — HYDROCODONE-ACETAMINOPHEN 5-325 MG PO TABS
2.0000 | ORAL_TABLET | Freq: Four times a day (QID) | ORAL | Status: DC | PRN
  Administered 2015-04-21 – 2015-04-22 (×2): 2 via ORAL
  Filled 2015-04-18 (×3): qty 2

## 2015-04-18 MED ORDER — POTASSIUM CHLORIDE CRYS ER 20 MEQ PO TBCR
40.0000 meq | EXTENDED_RELEASE_TABLET | Freq: Once | ORAL | Status: AC
  Administered 2015-04-18: 40 meq via ORAL
  Filled 2015-04-18: qty 2

## 2015-04-18 NOTE — Consult Note (Signed)
Name: Rodney Walker MRN: [id number] DOB: [birth year]    ADMISSION DATE:  04/16/2015 CONSULTATION DATE:  04/17/2015  REFERRING MD :  Dr. 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  PCCM consulted for evaluation of mass.    SIGNIFICANT EVENTS  Fall 2015  >> Dx with RUL mass like opacity  09/05/14  FOB >> per Dr. Sung Amabile, fungating mass RUL, bx c/w inflammation, granulomatous inflammation on brushing 04/10/15  FOB >> per Dr. Kendrick Fries, benign lung tissue w/o evidence of malignancy 04/15/15  Seen in ER for fevers, cough, SOB, Rx'd with doxycycline x 5 days from Dr. Kendrick Fries  STUDIES:  3/31  CT Chest >> mass like consolidation in suprahilar RUL with surrounding nodularity 4/19  CT Chest >> increased mass-like opacity in RUL, post-obstructive pneumonitis, severe emphysema R>L, scattered bilateral pulmonary nodules  SUBJECTIVE:  Fever curve trending down.  Still has pleuritic pain on Rt, but decreased.  Has cough with sputum, also decreased.  VITAL SIGNS: Temp:  [99.1 F (37.3 C)-103.1 F (39.5 C)] 99.1 F (37.3 C) (04/21 0626) Pulse Rate:  [87-100] 87 (04/21 0626) Resp:  [19-20] 19 (04/21 0626) BP: (112-131)/(63-65) 112/65 mmHg (04/21 0626) SpO2:  [94 %] 94 % (04/21 0626)  PHYSICAL EXAMINATION: General: pleasant Neuro: normal strength HEENT: no oral lesions Cardiovascular: regular Lungs: no wheeze, decreased BS RUL  Abdomen: soft, non tender Musculoskeletal: no edema Skin: no rashes    Recent Labs Lab 04/16/15 1514 04/17/15 0720 04/18/15 0030  NA 133* 137 134*  K 5.6* 4.0 3.5  CL 101 105 101  CO2 23 25 22   BUN 9 8 9   CREATININE 1.13 1.18 1.15  GLUCOSE 113* 111* 118*    Recent Labs Lab 04/16/15 1514 04/17/15 0720 04/18/15 0030  HGB 11.7* 11.7*  11.7*  HCT 32.5* 32.1* 31.6*  WBC 9.5 11.1* 13.3*  PLT 199 161 159   Dg Chest 2 View  04/16/2015   CLINICAL DATA:  Right upper back pain, cough and fever, HIV infection  EXAM: CHEST  2 VIEW  COMPARISON:  04/14/2015 and 03/28/2015  FINDINGS: Cardiomediastinal silhouette is stable. Again noted significant bullous changes bilateral upper lobe. Persistent masslike opacity in right upper lobe which is stable. Measures about 3.4 cm. There is new air-fluid level in right suprahilar region. Layering fluid within emphysematous bullae suspected. Infected fluid cannot be excluded. Clinical correlation is necessary. Left lung is clear.  IMPRESSION: Again noted significant bullous changes bilateral upper lobe. Persistent masslike opacity in right upper lobe which is stable. Measures about 3.4 cm. There is new air-fluid level in right suprahilar region. Layering fluid within emphysematous bullae suspected. Infected fluid cannot be excluded. Clinical correlation is necessary.   Electronically Signed   By: Natasha Mead M.D.   On: 04/16/2015 16:11   Ct Chest W Contrast  04/16/2015   CLINICAL DATA:  Shortness of breath, chest pain, upper back pain, fever, cough, history coronary artery disease, HIV  EXAM: CT CHEST WITH CONTRAST  TECHNIQUE: Multidetector CT imaging of the chest was performed during intravenous contrast administration. Sagittal and coronal MPR images reconstructed from axial data set.  CONTRAST:  55mL OMNIPAQUE IOHEXOL 300 MG/ML  SOLN IV  COMPARISON:  03/28/2015  FINDINGS: Thoracic vascular structures grossly patent on nondedicated exam.  Minimally enlarged RIGHT peritracheal lymph node 11 mm short axis image 14 little changed.  Multiple additional normal size mediastinal lymph nodes identified.  Visualized portion of upper abdomen unremarkable.  Severe bullous disease RIGHT apex with scattered emphysematous changes in both upper lobes.  Persistent masslike opacity in RIGHT upper lobe 4.8 x 4.6 cm image 21  previously 3.5 x 2.5 cm.  Several small adjacent nodular foci are identified with additional postobstructive pneumonitis in the RIGHT upper lobe.  Stable small nonspecific nodular foci in both lungs.  Dependent atelectasis in the lower lobes bilaterally.  No pulmonary infiltrate, pleural effusion or pneumothorax.  Osseous structures unremarkable.  IMPRESSION: Increase in size of mass-like opacity in RIGHT upper lobe now measuring 4.8 x 4.6 cm, worrisome for malignancy.  Postobstructive pneumonitis RIGHT upper lobe.  Severe emphysematous changes with bullous disease particularly at RIGHT apex.  Scattered BILATERAL pulmonary nodules, little changed since 03/28/2015; recommend assessment on follow-up imaging.   Electronically Signed   By: Ulyses Southward M.D.   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  He was treated for pneumonia in April 2015.  His CT chest findings were relatively stable on imaging from 03/28/15, but had significant progression on CT chest 04/16/15.  This was associated with new symptoms of fever, cough, sputum, and Rt sided pleuritic chest pain.  I suspect he developed a bacterial pneumonia in the setting of a stable Rt upper lung mass, rather than progression of his Rt upper lung mass.  He has improved clinically with antibiotics.  ASSESSMENT / PLAN:  BAL fungal 4/13 >> BAL AFB 4/13 >> Bordetella swab 4/19 >> Adenovirus Ab 4/19 >> Legionella urine Ag 4/19 >>  Blood 4/19 >> Pneumococcal urine Ag 4/19 >> negative RSV panel 4/19 >> negative Influenza PCR 4/19 >> negative Legionella total Ab 4/19 >> negative   Probable bacterial PNA. Plan: - Day 3 of Abx, currently on unasyn >> defer to ID when to transition to oral Abx - f/u CXR 4/22  Rt upper lung mass. He has undergone repeat bronchoscopy which have been unrevealing  for a cause of this. 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 Plan: - needs to stop smoking cigarettes and THC - will need PFT's as outpt to further assess - optimize tx for HIV per ID  HIV infection. Plan:  - anti-retroviral tx per ID   Coralyn Helling, MD [facility] 04/18/2015, 9:23 AM Pager:  [phone] After 3pm call: [phone]

## 2015-04-18 NOTE — Progress Notes (Signed)
INITIAL NUTRITION ASSESSMENT  DOCUMENTATION CODES Per approved criteria  -Not Applicable   INTERVENTION:  Continue Ensure Enlive po BID, each supplement provides 350 kcal and 20 grams of protein RD to follow for nutrition care plan  NUTRITION DIAGNOSIS: Increased nutrient needs related to catabolic illness as evidenced by estimated nutrition needs  Goal: Pt to meet >/= 90% of their estimated nutrition needs   Monitor:  PO & supplemental intake, weight, labs, I/O's  Reason for Assessment: Malnutrition Screening Tool Report  34 y.o. male  Admitting Dx: PNA (pneumonia)  ASSESSMENT: 34 year old Male with HIV ( last CD4>300) on ART, chronic RUL mass with bulla and emphysematous changes, with a nondiagnostic bronchoscopy in September 2015 followed by repeat bronchoscopy 2 weeks back which is a nonmalignant, was admitted on 4/19 in worsening shortness of breath, and pain in his right upper back with finding of increasing the size of his right upper lung mass with postobstructive pneumonitis.  Patient reports his appetite is good at this time.  PTA was consuming breakfast and then his 2nd meal around 9/10 PM.  Was consuming Ensure up until recently (March 2016) as he had a prescription.  He reports he's lost "a lot" of weight, however, readings below has been fairly stable.  Per office visit records, pt was exercising as well.  Has Ensure Enlive supplements ordered BID which he enjoys.  Nutrition Focused Physical Exam:  Subcutaneous Fat:  Orbital Region: N/A Upper Arm Region: WNL Thoracic and Lumbar Region: N/A  Muscle:  Temple Region: mild to moderate depletion Clavicle Bone Region: moderate to severe depletion Clavicle and Acromion Bone Region: moderate to severe depletion Scapular Bone Region: N/A Dorsal Hand: N/A Patellar Region: N/A Anterior Thigh Region: mild to moderate depletion Posterior Calf Region: mild to moderate depletion  Edema: none  Height: Ht Readings from  Last 1 Encounters:  04/16/15 5\' 4"  (1.626 m)    Weight: Wt Readings from Last 1 Encounters:  04/16/15 141 lb 1.6 oz (64.003 kg)    Ideal Body Weight: 130 lb  % Ideal Body Weight: 108%  Wt Readings from Last 20 Encounters:  04/16/15 141 lb 1.6 oz (64.003 kg)  04/14/15 132 lb 14.4 oz (60.283 kg)  04/10/15 134 lb (60.782 kg)  03/21/15 134 lb (60.782 kg)  03/14/15 135 lb (61.236 kg)  01/23/15 134 lb (60.782 kg)  12/13/14 138 lb (62.596 kg)  10/31/14 140 lb (63.504 kg)  09/26/14 140 lb 8 oz (63.73 kg)  09/25/14 139 lb 12.8 oz (63.413 kg)  08/31/14 145 lb (65.772 kg)  07/25/14 136 lb 8 oz (61.916 kg)  06/11/14 137 lb (62.143 kg)  04/23/14 127 lb (57.607 kg)  04/19/14 122 lb (55.339 kg)  04/11/14 127 lb (57.607 kg)  03/31/14 130 lb 4.7 oz (59.1 kg)  03/22/14 128 lb (58.06 kg)  03/19/14 141 lb (63.957 kg)  03/18/14 141 lb (63.957 kg)    Usual Body Weight: 140 lb  % Usual Body Weight: 101%  BMI:  Body mass index is 24.21 kg/(m^2).  Estimated Nutritional Needs: Kcal: 2100-2300 Protein: 110-120 gm Fluid: 2.1-2.3 L  Skin: Intact  Diet Order: Regular  EDUCATION NEEDS: -No education needs identified at this time   Intake/Output Summary (Last 24 hours) at 04/18/15 1534 Last data filed at 04/18/15 1505  Gross per 24 hour  Intake 2313.33 ml  Output    350 ml  Net 1963.33 ml    Labs:   Recent Labs Lab 04/16/15 1514 04/17/15 0720 04/18/15 0030  NA 133*  137 134*  K 5.6* 4.0 3.5  CL 101 105 101  CO2 BUN CREATININE 1.13 1.18 1.15  CALCIUM 8.5 8.4 8.1*  GLUCOSE 113* 111* 118*    Scheduled Meds: . ampicillin-sulbactam (UNASYN) IV  3 g Intravenous Q6H  . dolutegravir  50 mg Oral Daily  . emtricitabine-tenofovir  1 tablet Oral Daily  . enoxaparin (LOVENOX) injection  40 mg Subcutaneous Q24H  . feeding supplement (ENSURE ENLIVE)  237 mL Oral BID BM    Continuous Infusions: . sodium chloride 600 mL (04/18/15 1610)    Past Medical  History  Diagnosis Date  . Chest pain 2010  . Hyperlipidemia   . Coronary artery disease     SPONTANEOUS DISSECTION AND PLAQUE RUPTURE OF HIS LEFT ANTERIOR DESCENDING ARTERY  . MI (myocardial infarction)   . HIV infection   . Empyema lung   . Emphysema lung     Past Surgical History  Procedure Laterality Date  . Cardiac catheterization  06/24/2009    EF 60%  . Cardiac catheterization  06/17/2009    EF 45%. ANTERIOR HYPOKINESIS  . US echocardiography  12/09/2009    EF 55-60%  . Video bronchoscopy Bilateral 09/05/2014    Procedure: VIDEO BRONCHOSCOPY WITH FLUORO;  Surgeon: Merwyn Katos, MD;  Location: [facility] ENDOSCOPY;  Service: Cardiopulmonary;  Laterality: Bilateral;  . Video bronchoscopy Bilateral 04/10/2015    Procedure: VIDEO BRONCHOSCOPY WITH FLUORO;  Surgeon: Lupita Leash, MD;  Location: [facility] ENDOSCOPY;  Service: Cardiopulmonary;  Laterality: Bilateral;    Maureen Chatters, RD, LDN Pager #: [phone] After-Hours Pager #: [phone]

## 2015-04-18 NOTE — Progress Notes (Signed)
RN paged earlier temp of 102.46F. Gave Motrin. Temp down afterwards. NP reviewed chart. Pt here with sepsis. So far, blood cultures and urine cultures neg. CXR today and to r/p tomorrow to recheck PNA. No further action needed at this time.  KJKG, NP [facility]

## 2015-04-18 NOTE — Progress Notes (Signed)
Patient's temperature now 99.5, denies headache, sinus pressure at this time, will continue to monitor.  Diamantina Monks, RN

## 2015-04-18 NOTE — Progress Notes (Signed)
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 Patient septic with ongoing fever.   Assessment/Plan: Sepsis secondary to postobstructive PNA with increased right upper lobe lung mass Patient having ongoing fever with leukocytosis. Antibiotic switched to IV Unasyn after discussed with ID. -Appreciate pulmonary evaluation.. Recommend empiric antibiotic for 3-6 weeks and reassess with repeat chest CT and PFT as outpatient. Recommend low yield for repeat bronchoscopy and high risk for CT-guided transthoracic needle biopsy given severe bullous disease.  If unimproved or worse then would need resection by thoracic surgery. -Repeat chest x-ray in a.m. -Supportive care with antitussives and Tylenol. -follow AFB cx from recent bx. Cultures on admission have been negative.    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 Counseled strongly on cessation.  HIV Continue ART. Follows with Dr. Drue Second.   Hyperkalemia resolved  DVT prophylaxis  Diet: Regular  Code Status: Full code Family Communication: None at bedside Disposition Plan: Continue inpatient. Home on symptoms improved and afebrile   Consultants:  Pulmonary  ID  Procedures:  None  Antibiotics:  IV vancomycin and Zosyn x1  Augmentin 4/20--  HPI/Subjective: Patient continued to be febrile with MAXIMUM TEMPERATURE of 103F overnight. Given IV fluids. Repeat lactate negative. Still  has pain in his right upper back worsens with coughing and deep inspiration.   Objective: Filed Vitals:   04/18/15 1341  BP: 128/71  Pulse:   Temp: 98.3 F (36.8 C)  Resp:     Intake/Output Summary (Last 24 hours) at 04/18/15 1354 Last data filed at 04/18/15 0900  Gross per 24 hour  Intake   1335 ml  Output      0 ml  Net   1335 ml   Filed Weights   04/16/15 1455 04/16/15 2009  Weight: 59.875 kg (132 lb) 64.003 kg (141 lb 1.6 oz)    Exam:   General: Middle aged male in no acute distress  HEENT: No pallor, moist oral mucosa, supple neck  Chest: Diminished breath sounds over right lung, no added sounds  CVS: Normal S1 and S2, no murmur or gallop  GI: Soft, nondistended, nontender,  Musculoskeletal: Warm, no edema  CNS: Alert and oriented  Data Reviewed: Basic Metabolic Panel:  Recent Labs Lab 04/14/15 1746 04/16/15 1514 04/17/15 0720 04/18/15 0030  NA 133* 133* 137 134*  K 3.3* 5.6* 4.0 3.5  CL 97 101 105 101  CO2 26 23 25 22   GLUCOSE 95 113* 111* 118*  BUN 10 9 8 9   CREATININE 1.09 1.13 1.18 1.15  CALCIUM 8.6 8.5 8.4 8.1*   Liver Function Tests:  Recent Labs Lab 04/16/15 1514  AST 54*  ALT 40  ALKPHOS 79  BILITOT 2.5*  PROT 6.7  ALBUMIN 2.7*   No results for input(s): LIPASE, AMYLASE in the last 168 hours. No results for input(s): AMMONIA in the last 168 hours. CBC:  Recent Labs Lab 04/14/15 1746 04/16/15 1514 04/17/15 0720 04/18/15 0030  WBC 10.7* 9.5 11.1* 13.3*  NEUTROABS  --  8.1*  8.2* 10.8*  HGB 12.8* 11.7* 11.7* 11.7*  HCT 35.2* 32.5* 32.1* 31.6*  MCV 86.9 88.6 87.2 87.1  PLT 208 199 161 159   Cardiac Enzymes: No results for input(s): CKTOTAL, CKMB, CKMBINDEX, TROPONINI in the last 168 hours. BNP (last 3 results) No results for input(s): BNP in the last 8760 hours.  ProBNP (last 3 results) No results for input(s): PROBNP in the last 8760 hours.  CBG: No results for input(s): GLUCAP in the last 168 hours.  Recent  Results (from the past 240 hour(s))  AFB culture with smear     Status: None (Preliminary result)   Collection Time: 04/10/15  8:45 AM  Result Value Ref Range Status   Specimen Description BRONCHIAL ALVEOLAR LAVAGE  Final   Special Requests Immunocompromised  Final   Acid Fast Smear   Final    NO ACID FAST BACILLI SEEN Performed at [facility]    Culture   Final    CULTURE WILL BE EXAMINED FOR 6 WEEKS BEFORE ISSUING A FINAL REPORT Performed at [facility]    Report Status PENDING  Incomplete  Fungus Culture with Smear     Status: None (Preliminary result)   Collection Time: 04/10/15  8:45 AM  Result Value Ref Range Status   Specimen Description BRONCHIAL ALVEOLAR LAVAGE  Final   Special Requests Immunocompromised  Final   Fungal Smear   Final    NO YEAST OR FUNGAL ELEMENTS SEEN Performed at [facility]    Culture   Final    CULTURE IN PROGRESS FOR FOUR WEEKS Performed at [facility]    Report Status PENDING  Incomplete  Culture, respiratory (NON-Expectorated)     Status: None   Collection Time: 04/10/15  8:45 AM  Result Value Ref Range Status   Specimen Description BRONCHIAL ALVEOLAR LAVAGE  Final   Special Requests Immunocompromised  Final   Gram Stain   Final    NO WBC SEEN RARE SQUAMOUS EPITHELIAL CELLS PRESENT RARE GRAM POSITIVE COCCI IN PAIRS IN CLUSTERS Performed at [facility]    Culture   Final    Non-Pathogenic Oropharyngeal-type Flora Isolated. Performed at [facility]    Report Status 04/12/2015 FINAL  Final  Culture, blood (routine x 2)     Status: None (Preliminary result)   Collection Time: 04/16/15  3:14 PM  Result Value Ref Range Status   Specimen Description BLOOD LEFT ARM  Final   Special Requests BOTTLES DRAWN AEROBIC AND ANAEROBIC  Final   Culture   Final           BLOOD CULTURE RECEIVED NO GROWTH TO DATE CULTURE WILL BE HELD FOR 5 DAYS BEFORE ISSUING A FINAL NEGATIVE REPORT Performed at  [facility]    Report Status PENDING  Incomplete  Urine culture     Status: None   Collection Time: 04/16/15  6:02 PM  Result Value Ref Range Status   Specimen Description URINE, RANDOM  Final   Special Requests NONE  Final   Colony Count NO GROWTH Performed at [facility]   Final   Culture NO GROWTH Performed at [facility]   Final   Report Status 04/18/2015 FINAL  Final  Culture, blood (routine x 2)     Status: None (Preliminary result)   Collection Time: 04/16/15  6:15 PM  Result Value Ref Range Status   Specimen Description BLOOD RIGHT ARM  Final   Special Requests BOTTLES DRAWN AEROBIC  AND ANAEROBIC 5CC  Final   Culture   Final           BLOOD CULTURE RECEIVED NO GROWTH TO DATE CULTURE WILL BE HELD FOR 5 DAYS BEFORE ISSUING A FINAL NEGATIVE REPORT Performed at [facility]    Report Status PENDING  Incomplete  Culture, blood (routine x 2) Call MD if unable to obtain prior to antibiotics being given     Status: None (Preliminary result)   Collection Time: 04/16/15  9:10 PM  Result Value Ref Range Status   Specimen Description BLOOD RIGHT HAND  Final   Special Requests   Final    BOTTLES DRAWN AEROBIC AND ANAEROBIC 2CC BLUE 1CC RED   Culture   Final           BLOOD CULTURE RECEIVED NO GROWTH TO DATE CULTURE WILL BE HELD FOR 5 DAYS BEFORE ISSUING A FINAL NEGATIVE REPORT Note: Culture results may be compromised due to an inadequate volume of blood received in culture bottles. Performed at [facility]    Report Status PENDING  Incomplete  Culture, blood (routine x 2) Call MD if unable to obtain prior to antibiotics being given     Status: None (Preliminary result)   Collection Time: 04/16/15  9:20 PM  Result Value Ref Range Status   Specimen Description BLOOD LEFT HAND  Final   Special Requests   Final    BOTTLES DRAWN AEROBIC AND ANAEROBIC 3CC BLUE 2CC RED   Culture   Final           BLOOD CULTURE RECEIVED NO GROWTH TO DATE  CULTURE WILL BE HELD FOR 5 DAYS BEFORE ISSUING A FINAL NEGATIVE REPORT Note: Culture results may be compromised due to an inadequate volume of blood received in culture bottles. Performed at [facility]    Report Status PENDING  Incomplete  Respiratory virus antigens panel     Status: None   Collection Time: 04/16/15  9:47 PM  Result Value Ref Range Status   Source - RVPAN NASAL SWAB  Corrected   Respiratory Syncytial Virus A Negative Negative Final   Respiratory Syncytial Virus B Negative Negative Final   Influenza A Negative Negative Final   Influenza B Negative Negative Final   Parainfluenza 1 Negative Negative Final   Parainfluenza 2 Negative Negative Final   Parainfluenza 3 Negative Negative Final   Metapneumovirus Negative Negative Final   Rhinovirus Negative Negative Final   Adenovirus Negative Negative Final    Comment: (NOTE) Performed At: [facility] [address] [place], [place] [id number] Mila Homer MD WU:[id number]      Studies: Dg Chest 2 View  04/16/2015   CLINICAL DATA:  Right upper back pain, cough and fever, HIV infection  EXAM: CHEST  2 VIEW  COMPARISON:  04/14/2015 and 03/28/2015  FINDINGS: Cardiomediastinal silhouette is stable. Again noted significant bullous changes bilateral upper lobe. Persistent masslike opacity in right upper lobe which is stable. Measures about 3.4 cm. There is new air-fluid level in right suprahilar region. Layering fluid within emphysematous bullae suspected. Infected fluid cannot be excluded. Clinical correlation is necessary. Left lung is clear.  IMPRESSION: Again noted significant bullous changes bilateral upper lobe. Persistent masslike opacity in right upper lobe which is stable. Measures about 3.4 cm. There is new air-fluid level in right suprahilar region. Layering fluid within emphysematous bullae suspected. Infected fluid cannot be excluded. Clinical correlation is necessary.   Electronically Signed   By:  Natasha Mead  M.D.   On: 04/16/2015 16:11   Ct Chest W Contrast  04/16/2015   CLINICAL DATA:  Shortness of breath, chest pain, upper back pain, fever, cough, history coronary artery disease, HIV  EXAM: CT CHEST WITH CONTRAST  TECHNIQUE: Multidetector CT imaging of the chest was performed during intravenous contrast administration. Sagittal and coronal MPR images reconstructed from axial data set.  CONTRAST:  70mL OMNIPAQUE IOHEXOL 300 MG/ML  SOLN IV  COMPARISON:  03/28/2015  FINDINGS: Thoracic vascular structures grossly patent on nondedicated exam.  Minimally enlarged RIGHT peritracheal lymph node 11 mm short axis image 14 little changed.  Multiple additional normal size mediastinal lymph nodes identified.  Visualized portion of upper abdomen unremarkable.  Severe bullous disease RIGHT apex with scattered emphysematous changes in both upper lobes.  Persistent masslike opacity in RIGHT upper lobe 4.8 x 4.6 cm image 21 previously 3.5 x 2.5 cm.  Several small adjacent nodular foci are identified with additional postobstructive pneumonitis in the RIGHT upper lobe.  Stable small nonspecific nodular foci in both lungs.  Dependent atelectasis in the lower lobes bilaterally.  No pulmonary infiltrate, pleural effusion or pneumothorax.  Osseous structures unremarkable.  IMPRESSION: Increase in size of mass-like opacity in RIGHT upper lobe now measuring 4.8 x 4.6 cm, worrisome for malignancy.  Postobstructive pneumonitis RIGHT upper lobe.  Severe emphysematous changes with bullous disease particularly at RIGHT apex.  Scattered BILATERAL pulmonary nodules, little changed since 03/28/2015; recommend assessment on follow-up imaging.   Electronically Signed   By: Ulyses Southward M.D.   On: 04/16/2015 17:49    Scheduled Meds: . ampicillin-sulbactam (UNASYN) IV  3 g Intravenous Q6H  . dolutegravir  50 mg Oral Daily  . emtricitabine-tenofovir  1 tablet Oral Daily  . enoxaparin (LOVENOX) injection  40 mg Subcutaneous Q24H  .  feeding supplement (ENSURE ENLIVE)  237 mL Oral BID BM   Continuous Infusions: . sodium chloride 600 mL (04/18/15 0812)      Time spent: 35 minutes    Rodney Walker  [facility] Pager [phone]. If 7PM-7AM, please contact night-coverage at [email or web address], password [facility] 04/18/2015, 1:54 PM  LOS: 2 days

## 2015-04-18 NOTE — Progress Notes (Signed)
Patient had c/o of pressure above eyes, headache and temperature 102.8, provided patient with PRN motrin 200mg . Will continue to monitor.  Diamantina Monks, RN

## 2015-04-19 ENCOUNTER — Inpatient Hospital Stay (HOSPITAL_COMMUNITY): Payer: Self-pay

## 2015-04-19 DIAGNOSIS — J852 Abscess of lung without pneumonia: Secondary | ICD-10-CM

## 2015-04-19 LAB — CBC
HCT: 31.5 % — ABNORMAL LOW (ref 39.0–52.0)
Hemoglobin: 11.6 g/dL — ABNORMAL LOW (ref 13.0–17.0)
MCH: 31.6 pg (ref 26.0–34.0)
MCHC: 36.8 g/dL — ABNORMAL HIGH (ref 30.0–36.0)
MCV: 85.8 fL (ref 78.0–100.0)
PLATELETS: 161 10*3/uL (ref 150–400)
RBC: 3.67 MIL/uL — ABNORMAL LOW (ref 4.22–5.81)
RDW: 13.3 % (ref 11.5–15.5)
WBC: 12.8 10*3/uL — AB (ref 4.0–10.5)

## 2015-04-19 LAB — PROCALCITONIN: Procalcitonin: 1.1 ng/mL

## 2015-04-19 LAB — ADENOVIRUS ANTIBODIES: Adenovirus Antibody: 1:16 {titer} — ABNORMAL HIGH

## 2015-04-19 MED ORDER — LEVOFLOXACIN IN D5W 750 MG/150ML IV SOLN
750.0000 mg | INTRAVENOUS | Status: DC
  Administered 2015-04-19 – 2015-04-22 (×4): 750 mg via INTRAVENOUS
  Filled 2015-04-19 (×5): qty 150

## 2015-04-19 MED ORDER — ACETAMINOPHEN 325 MG PO TABS
650.0000 mg | ORAL_TABLET | Freq: Four times a day (QID) | ORAL | Status: DC | PRN
  Administered 2015-04-19 – 2015-04-21 (×3): 650 mg via ORAL
  Filled 2015-04-19 (×3): qty 2

## 2015-04-19 NOTE — Consult Note (Signed)
Name: Rodney Walker MRN: [id number] DOB: 12-Feb-1981    ADMISSION DATE:  04/16/2015 CONSULTATION DATE:  04/17/2015  REFERRING MD :  Dr. 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  PCCM consulted for evaluation of mass.    SIGNIFICANT EVENTS  Fall 2015  >> Dx with RUL mass like opacity  09/05/14  FOB >> per Dr. Sung Amabile, fungating mass RUL, bx c/w inflammation, granulomatous inflammation on brushing 04/10/15  FOB >> per Dr. Kendrick Fries, benign lung tissue w/o evidence of malignancy 04/15/15  Seen in ER for fevers, cough, SOB, Rx'd with doxycycline x 5 days from Dr. Kendrick Fries  STUDIES:  3/31  CT Chest >> mass like consolidation in suprahilar RUL with surrounding nodularity 4/19  CT Chest >> increased mass-like opacity in RUL, post-obstructive pneumonitis, severe emphysema R>L, scattered bilateral pulmonary nodules  SUBJECTIVE:    Still has pleuritic pain on Rt,  Iecreased.  Has cough with sputum,  decreased.  VITAL SIGNS: Temp:  [98.3 F (36.8 C)-103 F (39.4 C)] 100.2 F (37.9 C) (04/22 0639) Pulse Rate:  [93-100] 93 (04/22 0541) Resp:  [19-20] 20 (04/22 0541) BP: (107-128)/(58-71) 107/58 mmHg (04/22 0541) SpO2:  [97 %-99 %] 97 % (04/22 0541)  PHYSICAL EXAMINATION: General: pleasant, complains of increased back pain Neuro: normal strength HEENT: no oral lesions Cardiovascular: regular Lungs: no wheeze, decreased BS RUL  Abdomen: soft, non tender Musculoskeletal: no edema Skin: no rashes    Recent Labs Lab 04/16/15 1514 04/17/15 0720 04/18/15 0030  NA 133* 137 134*  K 5.6* 4.0 3.5  CL 101 105 101  CO2 BUN CREATININE 1.13 1.18 1.15  GLUCOSE 113* 111* 118*    Recent Labs Lab 04/17/15 0720 04/18/15 0030 04/19/15 0521  HGB 11.7*  11.7* 11.6*  HCT 32.1* 31.6* 31.5*  WBC 11.1* 13.3* 12.8*  PLT 161 159 161   Dg Chest 2 View  04/19/2015   CLINICAL DATA:  Congestion.  Fever.  EXAM: CHEST  2 VIEW  COMPARISON:  04/16/2015  FINDINGS: There is an air-fluid level in the right upper lung zone. This is compatible with fluid within the cavitary right upper lobe lung mass. The amount of fluid has significantly increased since the prior study. Fluid obscures the right upper lobe mass partially. Left lung is clear. Normal heart size. No pneumothorax.  IMPRESSION: Increasing fluid in the right upper lobe cavitary lung mass worrisome for infection.   Electronically Signed   By: Jolaine Click M.D.   On: 04/19/2015 08:06   DISCUSSION: 35 yo male smoker with hx of THC use and HIV (CD4 320 from 03/14/15) has bullous emphysema first noted on CT chest 03/18/14, and RUL mass first noted on CT chest 08/31/14.  He was treated for pneumonia in April 2015.  His CT chest findings were relatively stable on imaging from 03/28/15, but had significant progression on CT chest 04/16/15.  This was associated with new symptoms of fever, cough, sputum, and Rt sided pleuritic chest pain.  I suspect he developed a bacterial pneumonia in the setting of a stable Rt upper lung mass, rather than progression of his Rt upper lung mass.  He has improved clinically with antibiotics.  ASSESSMENT / PLAN:  BAL fungal 4/13 >>  BAL AFB 4/13 >> Bordetella swab 4/19 >>neg Adenovirus Ab 4/19 >> Legionella urine Ag 4/19 >>  Blood 4/19 >> Pneumococcal urine Ag 4/19 >> negative RSV panel 4/19 >> negative Influenza PCR 4/19 >> negative Legionella total Ab 4/19 >> negative   Probable bacterial PNA. Plan: - Day 3 of Abx, currently on unasyn >> defer to ID when to transition to oral Abx - f/u CXR 4/22 worse Rt upper lung mass. He has undergone repeat bronchoscopy which have been unrevealing for a cause of this. Plan: -  CXR 4/22 is worsed, Would consult CVTS for evaluation  possible resection. 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 Plan: - needs to stop smoking cigarettes and THC - will need PFT's as outpt to further assess - optimize tx for HIV per ID  HIV infection. Plan:  - anti-retroviral tx per ID  Brett Canales Minor ACNP [facility] Pager [phone] till 3 pm If no answer page [phone]  I reviewed CXR myself, infiltrate gaining density but patient feels ok.  Air-fluid level on CXR.  Probably Bacterial PNA: worsening on CXR but WBC improving on levofloxacin.  - Continue levo.  - F/U on cultures.  - F/U PCT.  ?RUL mass: repeat bronchoscopies less than helpful.  - CXR worsened on 4/22 with air fluid levels, likely an abscess of infected bleb.  - Continue abx.  - Consult CVTS for ?excision.  Bullous emphysema: significant for age.  Very severe.  - Smoking cessation both cigarettes and THC.  - PFTs as outpatient.  - May need lung reduction surgery versus valves, will discuss with CVTS.  PCCM will see again on Monday.  Patient seen and examined, agree with above note.  I dictated the care and orders written for this patient under my direction.  Alyson Reedy, M.D. [facility] Pulmonary/Critical Care Medicine. Pager: [phone]. After hours pager: [phone].  04/19/2015, 11:19 AM

## 2015-04-19 NOTE — Progress Notes (Addendum)
[facility]  Date of Admission:  04/16/2015  Antibiotics: unasyn  Subjective: Still with pleuritic chest pain  Objective: Temp:  [98.3 F (36.8 C)-103 F (39.4 C)] 100.2 F (37.9 C) (04/22 0639) Pulse Rate:  [93-100] 93 (04/22 0541) Resp:  [19-20] 20 (04/22 0541) BP: (107-128)/(58-71) 107/58 mmHg (04/22 0541) SpO2:  [97 %-99 %] 97 % (04/22 0541)  General: awake, alert, nad Skin: no rashes Lungs: CTA B Cor: rrr Abdomen: soft, nt, nd   Lab Results Lab Results  Component Value Date   WBC 12.8* 04/19/2015   HGB 11.6* 04/19/2015   HCT 31.5* 04/19/2015   MCV 85.8 04/19/2015   PLT 161 04/19/2015    Lab Results  Component Value Date   CREATININE 1.15 04/18/2015   BUN 9 04/18/2015   NA 134* 04/18/2015   K 3.5 04/18/2015   CL 101 04/18/2015   CO2 22 04/18/2015    Lab Results  Component Value Date   ALT 40 04/16/2015   AST 54* 04/16/2015   ALKPHOS 79 04/16/2015   BILITOT 2.5* 04/16/2015      Microbiology: Recent Results (from the past 240 hour(s))  AFB culture with smear     Status: None (Preliminary result)   Collection Time: 04/10/15  8:45 AM  Result Value Ref Range Status   Specimen Description BRONCHIAL ALVEOLAR LAVAGE  Final   Special Requests Immunocompromised  Final   Acid Fast Smear   Final    NO ACID FAST BACILLI SEEN Performed at [facility]    Culture   Final    CULTURE WILL BE EXAMINED FOR 6 WEEKS BEFORE ISSUING A FINAL REPORT Performed at [facility]    Report Status PENDING  Incomplete  Fungus Culture with Smear     Status: None (Preliminary result)   Collection Time: 04/10/15  8:45 AM  Result Value Ref Range Status   Specimen Description BRONCHIAL ALVEOLAR LAVAGE  Final   Special Requests Immunocompromised  Final   Fungal Smear   Final    NO YEAST OR FUNGAL ELEMENTS SEEN Performed at [facility]    Culture   Final    CULTURE IN PROGRESS FOR FOUR WEEKS Performed at [facility]    Report Status PENDING  Incomplete  Culture, respiratory (NON-Expectorated)     Status: None   Collection Time: 04/10/15  8:45 AM  Result Value Ref Range Status   Specimen Description BRONCHIAL ALVEOLAR LAVAGE  Final   Special Requests Immunocompromised  Final   Gram Stain   Final    NO WBC SEEN RARE SQUAMOUS EPITHELIAL CELLS PRESENT RARE GRAM POSITIVE COCCI IN PAIRS IN CLUSTERS Performed at [facility]    Culture   Final    Non-Pathogenic Oropharyngeal-type Flora Isolated. Performed at [facility]    Report Status 04/12/2015 FINAL  Final  Culture, blood (routine x 2)     Status: None (Preliminary result)   Collection Time: 04/16/15  3:14 PM  Result Value Ref Range Status   Specimen Description BLOOD LEFT ARM  Final   Special Requests BOTTLES DRAWN AEROBIC AND ANAEROBIC  Final   Culture   Final           BLOOD CULTURE RECEIVED NO GROWTH TO DATE CULTURE WILL BE HELD FOR 5 DAYS BEFORE ISSUING A FINAL NEGATIVE REPORT Performed at [facility]    Report Status PENDING  Incomplete  Urine culture     Status: None  Collection Time: 04/16/15  6:02 PM  Result Value Ref Range Status   Specimen Description URINE, RANDOM  Final   Special Requests NONE  Final   Colony Count NO GROWTH Performed at [facility]   Final   Culture NO GROWTH Performed at [facility]   Final   Report Status 04/18/2015 FINAL  Final  Culture, blood (routine x 2)     Status: None (Preliminary result)   Collection Time: 04/16/15  6:15 PM  Result Value Ref Range Status   Specimen Description BLOOD RIGHT ARM  Final   Special Requests BOTTLES DRAWN AEROBIC AND ANAEROBIC 5CC  Final   Culture   Final           BLOOD CULTURE RECEIVED NO GROWTH TO DATE CULTURE WILL BE HELD FOR 5 DAYS BEFORE ISSUING A FINAL NEGATIVE REPORT Performed at [facility]    Report Status PENDING  Incomplete  Culture, blood (routine x 2) Call MD if unable to obtain  prior to antibiotics being given     Status: None (Preliminary result)   Collection Time: 04/16/15  9:10 PM  Result Value Ref Range Status   Specimen Description BLOOD RIGHT HAND  Final   Special Requests   Final    BOTTLES DRAWN AEROBIC AND ANAEROBIC 2CC BLUE 1CC RED   Culture   Final           BLOOD CULTURE RECEIVED NO GROWTH TO DATE CULTURE WILL BE HELD FOR 5 DAYS BEFORE ISSUING A FINAL NEGATIVE REPORT Note: Culture results may be compromised due to an inadequate volume of blood received in culture bottles. Performed at [facility]    Report Status PENDING  Incomplete  Culture, blood (routine x 2) Call MD if unable to obtain prior to antibiotics being given     Status: None (Preliminary result)   Collection Time: 04/16/15  9:20 PM  Result Value Ref Range Status   Specimen Description BLOOD LEFT HAND  Final   Special Requests   Final    BOTTLES DRAWN AEROBIC AND ANAEROBIC 3CC BLUE 2CC RED   Culture   Final           BLOOD CULTURE RECEIVED NO GROWTH TO DATE CULTURE WILL BE HELD FOR 5 DAYS BEFORE ISSUING A FINAL NEGATIVE REPORT Note: Culture results may be compromised due to an inadequate volume of blood received in culture bottles. Performed at [facility]    Report Status PENDING  Incomplete  Respiratory virus antigens panel     Status: None   Collection Time: 04/16/15  9:47 PM  Result Value Ref Range Status   Source - RVPAN NASAL SWAB  Corrected   Respiratory Syncytial Virus A Negative Negative Final   Respiratory Syncytial Virus B Negative Negative Final   Influenza A Negative Negative Final   Influenza B Negative Negative Final   Parainfluenza 1 Negative Negative Final   Parainfluenza 2 Negative Negative Final   Parainfluenza 3 Negative Negative Final   Metapneumovirus Negative Negative Final   Rhinovirus Negative Negative Final   Adenovirus Negative Negative Final    Comment: (NOTE) Performed At: [facility] [address] [place], [place]  [id number] Mila Homer MD BS:[id number]   Bordetella pertussis PCR     Status: None   Collection Time: 04/16/15  9:52 PM  Result Value Ref Range Status   B pertussis, DNA Negative Negative Final   B parapertussis, DNA Negative Negative Final    Comment: (NOTE)  This test was developed and its performance characteristics determined by [facility]. It has not been cleared or approved by the U.S. Food and Drug Administration. The FDA has determined that such clearance or approval is not necessary. This test is used for clinical purposes. It should not be regarded as investigational or research. Performed At: [facility] [address] [place], [place] [id number] Mila Homer MD ZD:[id number]     Studies/Results: Dg Chest 2 View  04/19/2015   CLINICAL DATA:  Congestion.  Fever.  EXAM: CHEST  2 VIEW  COMPARISON:  04/16/2015  FINDINGS: There is an air-fluid level in the right upper lung zone. This is compatible with fluid within the cavitary right upper lobe lung mass. The amount of fluid has significantly increased since the prior study. Fluid obscures the right upper lobe mass partially. Left lung is clear. Normal heart size. No pneumothorax.  IMPRESSION: Increasing fluid in the right upper lobe cavitary lung mass worrisome for infection.   Electronically Signed   By: Jolaine Click M.D.   On: 04/19/2015 08:06    Assessment/Plan:  1) pulmonary mass - seems to be infection.  Worsening on CXR.  Remains febrile on Unasyn. Nothing from Bronchoscopy positive.  CCM recommends CVTS consultation.   - I will broaden to levaquin\ - encourage ambulation, spirometry  2) HIV - on Tivicay and Truvada.  Last viral load 81 and CD4 of 320, well controlled.    Staci Righter, MD [facility] [email or web address] [phone] pager   [phone] cell 04/19/2015, 11:34 AM

## 2015-04-19 NOTE — Progress Notes (Signed)
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 Patient septic with ongoing fever.   Assessment/Plan: Sepsis secondary to postobstructive PNA with increased right upper lobe lung mass - ongoing fever .  Antibiotic switched to IV Unasyn after discussed with ID. Will call again today given persistent fever. . -Appreciate pulmonary evaluation.Marland Kitchen He should recommendation was empiric antibiotic for 3-6 weeks and reassess with repeat chest CT and PFT as outpatient. yield for repeat bronchoscopy and high risk for CT-guided transthoracic needle biopsy given severe bullous disease.  If unimproved or worse then would need resection by thoracic surgery. -Repeat chest x-ray this morning shows increased fluid in the right upper lobe cavitary lung mass. Will discuss with pulmonary CT surgery consult is needed at this time. -Supportive care with antitussives and Tylenol. -follow AFB cx from recent bx. Cultures on admission have been negative. Legionella antigen negative.    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 Counseled strongly on cessation.  HIV Continue ART. Follows with Dr. Drue Second.   Hyperkalemia resolved  DVT prophylaxis  Diet: Regular  Code Status: Full code Family Communication: Roommate at bedside Disposition Plan: Continue inpatient monitoring given persistent  fever.   Consultants:  Pulmonary  ID  Procedures:  None  Antibiotics:  IV vancomycin and Zosyn x1  Augmentin 4/20--  HPI/Subjective:  MAXIMUM TEMPERATURE of 102.31F overnight. Right upper back pain improved with pain medications. Denies shortness of breath. Patient frustrated on being kept in the hospital.   Objective: Filed Vitals:   04/19/15 0639  BP:   Pulse:   Temp: 100.2 F (37.9 C)  Resp:     Intake/Output Summary (Last 24 hours) at 04/19/15 9562 Last data filed at 04/19/15 0815  Gross per 24 hour  Intake   1570 ml  Output   1675 ml  Net   -105 ml   Filed Weights   04/16/15 1455 04/16/15 2009  Weight: 59.875 kg (132 lb) 64.003 kg (141 lb 1.6 oz)    Exam:   General: no acute distress  HEENT: , moist oral mucosa, supple neck  Chest: Diminished breath sounds over right lung, no added sounds  CVS: Normal S1 and S2, no murmur or gallop  GI: Soft, nondistended, nontender,  Musculoskeletal: Warm, no edema  CNS: Alert and oriented  Data Reviewed: Basic Metabolic Panel:  Recent Labs Lab 04/14/15 1746 04/16/15 1514 04/17/15 0720 04/18/15 0030  NA 133* 133* 137 134*  K 3.3* 5.6* 4.0 3.5  CL 97 101 105 101  CO2 GLUCOSE 95 113* 111* 118*  BUN CREATININE 1.09 1.13 1.18 1.15  CALCIUM 8.6 8.5 8.4 8.1*   Liver Function Tests:  Recent Labs Lab 04/16/15 1514  AST 54*  ALT 40  ALKPHOS 79  BILITOT 2.5*  PROT 6.7  ALBUMIN 2.7*   No results for input(s): LIPASE, AMYLASE in the last 168 hours. No results for input(s): AMMONIA in the last 168 hours. CBC:  Recent Labs Lab 04/14/15  1746 04/16/15 1514 04/17/15 0720 04/18/15 0030 04/19/15 0521  WBC 10.7* 9.5 11.1* 13.3* 12.8*  NEUTROABS  --  8.1* 8.2* 10.8*  --   HGB 12.8* 11.7* 11.7* 11.7* 11.6*  HCT 35.2* 32.5* 32.1* 31.6* 31.5*  MCV 86.9 88.6 87.2 87.1 85.8  PLT 208 199 161 159 161   Cardiac Enzymes: No results for input(s): CKTOTAL, CKMB, CKMBINDEX,  TROPONINI in the last 168 hours. BNP (last 3 results) No results for input(s): BNP in the last 8760 hours.  ProBNP (last 3 results) No results for input(s): PROBNP in the last 8760 hours.  CBG: No results for input(s): GLUCAP in the last 168 hours.  Recent Results (from the past 240 hour(s))  AFB culture with smear     Status: None (Preliminary result)   Collection Time: 04/10/15  8:45 AM  Result Value Ref Range Status   Specimen Description BRONCHIAL ALVEOLAR LAVAGE  Final   Special Requests Immunocompromised  Final   Acid Fast Smear   Final    NO ACID FAST BACILLI SEEN Performed at [facility]    Culture   Final    CULTURE WILL BE EXAMINED FOR 6 WEEKS BEFORE ISSUING A FINAL REPORT Performed at [facility]    Report Status PENDING  Incomplete  Fungus Culture with Smear     Status: None (Preliminary result)   Collection Time: 04/10/15  8:45 AM  Result Value Ref Range Status   Specimen Description BRONCHIAL ALVEOLAR LAVAGE  Final   Special Requests Immunocompromised  Final   Fungal Smear   Final    NO YEAST OR FUNGAL ELEMENTS SEEN Performed at [facility]    Culture   Final    CULTURE IN PROGRESS FOR FOUR WEEKS Performed at [facility]    Report Status PENDING  Incomplete  Culture, respiratory (NON-Expectorated)     Status: None   Collection Time: 04/10/15  8:45 AM  Result Value Ref Range Status   Specimen Description BRONCHIAL ALVEOLAR LAVAGE  Final   Special Requests Immunocompromised  Final   Gram Stain   Final    NO WBC SEEN RARE SQUAMOUS EPITHELIAL CELLS PRESENT RARE GRAM POSITIVE COCCI IN PAIRS IN CLUSTERS Performed at [facility]    Culture   Final    Non-Pathogenic Oropharyngeal-type Flora Isolated. Performed at [facility]    Report Status 04/12/2015 FINAL  Final  Culture, blood (routine x 2)     Status: None (Preliminary result)   Collection Time: 04/16/15  3:14 PM  Result Value Ref Range Status    Specimen Description BLOOD LEFT ARM  Final   Special Requests BOTTLES DRAWN AEROBIC AND ANAEROBIC  Final   Culture   Final           BLOOD CULTURE RECEIVED NO GROWTH TO DATE CULTURE WILL BE HELD FOR 5 DAYS BEFORE ISSUING A FINAL NEGATIVE REPORT Performed at [facility]    Report Status PENDING  Incomplete  Urine culture     Status: None   Collection Time: 04/16/15  6:02 PM  Result Value Ref Range Status   Specimen Description URINE, RANDOM  Final   Special Requests NONE  Final   Colony Count NO GROWTH Performed at [facility]   Final   Culture NO GROWTH Performed at [facility]   Final   Report Status 04/18/2015 FINAL  Final  Culture, blood (routine x 2)     Status: None (Preliminary result)  Collection Time: 04/16/15  6:15 PM  Result Value Ref Range Status   Specimen Description BLOOD RIGHT ARM  Final   Special Requests BOTTLES DRAWN AEROBIC AND ANAEROBIC 5CC  Final   Culture   Final           BLOOD CULTURE RECEIVED NO GROWTH TO DATE CULTURE WILL BE HELD FOR 5 DAYS BEFORE ISSUING A FINAL NEGATIVE REPORT Performed at [facility]    Report Status PENDING  Incomplete  Culture, blood (routine x 2) Call MD if unable to obtain prior to antibiotics being given     Status: None (Preliminary result)   Collection Time: 04/16/15  9:10 PM  Result Value Ref Range Status   Specimen Description BLOOD RIGHT HAND  Final   Special Requests   Final    BOTTLES DRAWN AEROBIC AND ANAEROBIC 2CC BLUE 1CC RED   Culture   Final           BLOOD CULTURE RECEIVED NO GROWTH TO DATE CULTURE WILL BE HELD FOR 5 DAYS BEFORE ISSUING A FINAL NEGATIVE REPORT Note: Culture results may be compromised due to an inadequate volume of blood received in culture bottles. Performed at [facility]    Report Status PENDING  Incomplete  Culture, blood (routine x 2) Call MD if unable to obtain prior to antibiotics being given     Status: None (Preliminary result)    Collection Time: 04/16/15  9:20 PM  Result Value Ref Range Status   Specimen Description BLOOD LEFT HAND  Final   Special Requests   Final    BOTTLES DRAWN AEROBIC AND ANAEROBIC 3CC BLUE 2CC RED   Culture   Final           BLOOD CULTURE RECEIVED NO GROWTH TO DATE CULTURE WILL BE HELD FOR 5 DAYS BEFORE ISSUING A FINAL NEGATIVE REPORT Note: Culture results may be compromised due to an inadequate volume of blood received in culture bottles. Performed at [facility]    Report Status PENDING  Incomplete  Respiratory virus antigens panel     Status: None   Collection Time: 04/16/15  9:47 PM  Result Value Ref Range Status   Source - RVPAN NASAL SWAB  Corrected   Respiratory Syncytial Virus A Negative Negative Final   Respiratory Syncytial Virus B Negative Negative Final   Influenza A Negative Negative Final   Influenza B Negative Negative Final   Parainfluenza 1 Negative Negative Final   Parainfluenza 2 Negative Negative Final   Parainfluenza 3 Negative Negative Final   Metapneumovirus Negative Negative Final   Rhinovirus Negative Negative Final   Adenovirus Negative Negative Final    Comment: (NOTE) Performed At: [facility] [address] [place], [place] [id number] Mila Homer MD WU:[id number]   Bordetella pertussis PCR     Status: None   Collection Time: 04/16/15  9:52 PM  Result Value Ref Range Status   B pertussis, DNA Negative Negative Final   B parapertussis, DNA Negative Negative Final    Comment: (NOTE) This test was developed and its performance characteristics determined by [employer] [facility]. It has not been cleared or approved by the U.S. Food and Drug Administration. The FDA has determined that such clearance or approval is not necessary. This test is used for clinical purposes. It should not be regarded as investigational or research. Performed At: [facility] [address] [place], [place] [id number] Mila Homer MD  VH:[id number]      Studies: Dg Chest  2 View  04/19/2015   CLINICAL DATA:  Congestion.  Fever.  EXAM: CHEST  2 VIEW  COMPARISON:  04/16/2015  FINDINGS: There is an air-fluid level in the right upper lung zone. This is compatible with fluid within the cavitary right upper lobe lung mass. The amount of fluid has significantly increased since the prior study. Fluid obscures the right upper lobe mass partially. Left lung is clear. Normal heart size. No pneumothorax.  IMPRESSION: Increasing fluid in the right upper lobe cavitary lung mass worrisome for infection.   Electronically Signed   By: Jolaine Click M.D.   On: 04/19/2015 08:06    Scheduled Meds: . ampicillin-sulbactam (UNASYN) IV  3 g Intravenous Q6H  . dolutegravir  50 mg Oral Daily  . emtricitabine-tenofovir  1 tablet Oral Daily  . enoxaparin (LOVENOX) injection  40 mg Subcutaneous Q24H  . feeding supplement (ENSURE ENLIVE)  237 mL Oral BID BM   Continuous Infusions: . sodium chloride Stopped (04/18/15 2335)      Time spent: 25 minutes    Rodney Walker  [facility] Pager [phone]. If 7PM-7AM, please contact night-coverage at [email or web address], password [facility] 04/19/2015, 9:22 AM  LOS: 3 days

## 2015-04-20 DIAGNOSIS — J852 Abscess of lung without pneumonia: Secondary | ICD-10-CM

## 2015-04-20 LAB — BLOOD GAS, ARTERIAL
Acid-Base Excess: 2.2 mmol/L — ABNORMAL HIGH (ref 0.0–2.0)
Bicarbonate: 26 mEq/L — ABNORMAL HIGH (ref 20.0–24.0)
Drawn by: 418751
FIO2: 0.21 %
O2 Saturation: 93.6 %
Patient temperature: 98.6
TCO2: 27.2 mmol/L (ref 0–100)
pCO2 arterial: 38.8 mmHg (ref 35.0–45.0)
pH, Arterial: 7.442 (ref 7.350–7.450)
pO2, Arterial: 75.5 mmHg — ABNORMAL LOW (ref 80.0–100.0)

## 2015-04-20 LAB — URINE MICROSCOPIC-ADD ON

## 2015-04-20 LAB — URINALYSIS, ROUTINE W REFLEX MICROSCOPIC
Bilirubin Urine: NEGATIVE
Glucose, UA: NEGATIVE mg/dL
Ketones, ur: NEGATIVE mg/dL
Leukocytes, UA: NEGATIVE
Nitrite: NEGATIVE
Protein, ur: >300 mg/dL — AB
Specific Gravity, Urine: 1.021 (ref 1.005–1.030)
Urobilinogen, UA: 1 mg/dL (ref 0.0–1.0)
pH: 7.5 (ref 5.0–8.0)

## 2015-04-20 LAB — LACTIC ACID, PLASMA
Lactic Acid, Venous: 1.4 mmol/L (ref 0.5–2.0)
Lactic Acid, Venous: 1.3 mmol/L (ref 0.5–2.0)

## 2015-04-20 LAB — SURGICAL PCR SCREEN
MRSA, PCR: NEGATIVE
Staphylococcus aureus: POSITIVE — AB

## 2015-04-20 MED ORDER — CHLORHEXIDINE GLUCONATE CLOTH 2 % EX PADS: 6.0000 | MEDICATED_PAD | Freq: Every day | CUTANEOUS | Status: DC

## 2015-04-20 MED ORDER — CHLORHEXIDINE GLUCONATE CLOTH 2 % EX PADS
6.0000 | MEDICATED_PAD | Freq: Every day | CUTANEOUS | Status: DC
  Administered 2015-04-21 – 2015-04-23 (×3): 6 via TOPICAL

## 2015-04-20 MED ORDER — MUPIROCIN 2 % EX OINT
TOPICAL_OINTMENT | Freq: Two times a day (BID) | CUTANEOUS | Status: DC
  Administered 2015-04-20 – 2015-04-22 (×5): via NASAL
  Filled 2015-04-20 (×2): qty 22

## 2015-04-20 MED ORDER — MUPIROCIN CALCIUM 2 % EX CREA
TOPICAL_CREAM | Freq: Two times a day (BID) | CUTANEOUS | Status: DC
  Administered 2015-04-21 – 2015-04-22 (×2): via TOPICAL
  Filled 2015-04-20: qty 15

## 2015-04-20 NOTE — Progress Notes (Signed)
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 Patient septic with ongoing fever.   Assessment/Plan: Sepsis secondary to postobstructive PNA with increased right upper lobe lung mass - ongoing fever .  abx further changed to levaquin by ISD for broad coverage. -Appreciate pulmonary evaluation. Consulted thoracic surgery given ongoing fever and persistant mass on imaging. Dr Morton Peters will see. -Supportive care with antitussives and Tylenol. -follow AFB cx from recent bx. Cultures on admission have been negative. Legionella antigen negative.    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 Counseled strongly on cessation.  HIV Continue ART. Follows with Dr. Drue Second.   Hyperkalemia resolved  DVT prophylaxis  Diet: Regular  Code Status: Full code Family Communication: none at bedside Disposition Plan: Continue inpatient monitoring given persistent fever and thoracic sx eval   Consultants:  Pulmonary  ID  CTVS  Procedures:  CT chest  Antibiotics:  IV vancomycin and Zosyn x1  unasyn 4/20-4/22  levaquin 4/22--  HPI/Subjective:  still febrile to 102F overnight. Denies any dyspnea. Rt upper back pain stable  on narcotics   Objective: Filed Vitals:   04/20/15 0517  BP: 112/64  Pulse:   Temp: 98.5 F (36.9 C)  Resp: 14    Intake/Output Summary (Last 24 hours) at 04/20/15 0948 Last data filed at 04/20/15 0900  Gross  per 24 hour  Intake    870 ml  Output      0 ml  Net    870 ml   Filed Weights   04/16/15 1455 04/16/15 2009  Weight: 59.875 kg (132 lb) 64.003 kg (141 lb 1.6 oz)    Exam:   General: no acute distress  HEENT: , moist oral mucosa, supple neck  Chest: Diminished breath sounds over right lung, no added sounds  CVS: Normal S1 and S2, no murmur or gallop  GI: Soft, nondistended, nontender, BS+  Musculoskeletal: Warm, no edema    Data Reviewed: Basic Metabolic Panel:  Recent Labs Lab 04/14/15 1746 04/16/15 1514 04/17/15 0720 04/18/15 0030  NA 133* 133* 137 134*  K 3.3* 5.6* 4.0 3.5  CL 97 101 105 101  CO2 GLUCOSE 95 113* 111* 118*  BUN CREATININE 1.09 1.13 1.18 1.15  CALCIUM 8.6 8.5 8.4 8.1*   Liver Function Tests:  Recent Labs Lab 04/16/15 1514  AST 54*  ALT 40  ALKPHOS 79  BILITOT 2.5*  PROT 6.7  ALBUMIN 2.7*   No results for input(s): LIPASE, AMYLASE in the last 168 hours. No results for input(s): AMMONIA in the last 168 hours. CBC:  Recent Labs Lab 04/14/15 1746 04/16/15 1514 04/17/15 0720 04/18/15 0030 04/19/15 0521  WBC 10.7* 9.5 11.1* 13.3* 12.8*  NEUTROABS  --  8.1* 8.2* 10.8*  --   HGB 12.8* 11.7* 11.7* 11.7* 11.6*  HCT 35.2* 32.5* 32.1* 31.6* 31.5*  MCV 86.9 88.6 87.2 87.1 85.8  PLT 208 199 161 159 161   Cardiac Enzymes: No results for  input(s): CKTOTAL, CKMB, CKMBINDEX, TROPONINI in the last 168 hours. BNP (last 3 results) No results for input(s): BNP in the last 8760 hours.  ProBNP (last 3 results) No results for input(s): PROBNP in the last 8760 hours.  CBG: No results for input(s): GLUCAP in the last 168 hours.  Recent Results (from the past 240 hour(s))  Culture, blood (routine x 2)     Status: None (Preliminary result)   Collection Time: 04/16/15  3:14 PM  Result Value Ref Range Status   Specimen Description BLOOD LEFT ARM  Final   Special Requests BOTTLES DRAWN AEROBIC AND ANAEROBIC  Final    Culture   Final           BLOOD CULTURE RECEIVED NO GROWTH TO DATE CULTURE WILL BE HELD FOR 5 DAYS BEFORE ISSUING A FINAL NEGATIVE REPORT Performed at [facility]    Report Status PENDING  Incomplete  Urine culture     Status: None   Collection Time: 04/16/15  6:02 PM  Result Value Ref Range Status   Specimen Description URINE, RANDOM  Final   Special Requests NONE  Final   Colony Count NO GROWTH Performed at [facility]   Final   Culture NO GROWTH Performed at [facility]   Final   Report Status 04/18/2015 FINAL  Final  Culture, blood (routine x 2)     Status: None (Preliminary result)   Collection Time: 04/16/15  6:15 PM  Result Value Ref Range Status   Specimen Description BLOOD RIGHT ARM  Final   Special Requests BOTTLES DRAWN AEROBIC AND ANAEROBIC 5CC  Final   Culture   Final           BLOOD CULTURE RECEIVED NO GROWTH TO DATE CULTURE WILL BE HELD FOR 5 DAYS BEFORE ISSUING A FINAL NEGATIVE REPORT Performed at [facility]    Report Status PENDING  Incomplete  Culture, blood (routine x 2) Call MD if unable to obtain prior to antibiotics being given     Status: None (Preliminary result)   Collection Time: 04/16/15  9:10 PM  Result Value Ref Range Status   Specimen Description BLOOD RIGHT HAND  Final   Special Requests   Final    BOTTLES DRAWN AEROBIC AND ANAEROBIC 2CC BLUE 1CC RED   Culture   Final           BLOOD CULTURE RECEIVED NO GROWTH TO DATE CULTURE WILL BE HELD FOR 5 DAYS BEFORE ISSUING A FINAL NEGATIVE REPORT Note: Culture results may be compromised due to an inadequate volume of blood received in culture bottles. Performed at [facility]    Report Status PENDING  Incomplete  Culture, blood (routine x 2) Call MD if unable to obtain prior to antibiotics being given     Status: None (Preliminary result)   Collection Time: 04/16/15  9:20 PM  Result Value Ref Range Status   Specimen Description BLOOD LEFT HAND  Final    Special Requests   Final    BOTTLES DRAWN AEROBIC AND ANAEROBIC 3CC BLUE 2CC RED   Culture   Final           BLOOD CULTURE RECEIVED NO GROWTH TO DATE CULTURE WILL BE HELD FOR 5 DAYS BEFORE ISSUING A FINAL NEGATIVE REPORT Note: Culture results may be compromised due to an inadequate volume of blood received in culture bottles. Performed at [facility]    Report Status PENDING  Incomplete  Respiratory virus antigens  panel     Status: None   Collection Time: 04/16/15  9:47 PM  Result Value Ref Range Status   Source - RVPAN NASAL SWAB  Corrected   Respiratory Syncytial Virus A Negative Negative Final   Respiratory Syncytial Virus B Negative Negative Final   Influenza A Negative Negative Final   Influenza B Negative Negative Final   Parainfluenza 1 Negative Negative Final   Parainfluenza 2 Negative Negative Final   Parainfluenza 3 Negative Negative Final   Metapneumovirus Negative Negative Final   Rhinovirus Negative Negative Final   Adenovirus Negative Negative Final    Comment: (NOTE) Performed At: [facility] [address] [place], [place] [id number] Mila Homer MD [id number]   Bordetella pertussis PCR     Status: None   Collection Time: 04/16/15  9:52 PM  Result Value Ref Range Status   B pertussis, DNA Negative Negative Final   B parapertussis, DNA Negative Negative Final    Comment: (NOTE) This test was developed and its performance characteristics determined by [facility]. It has not been cleared or approved by the U.S. Food and Drug Administration. The FDA has determined that such clearance or approval is not necessary. This test is used for clinical purposes. It should not be regarded as investigational or research. Performed At: [facility] [address] [place], [place] [id number] Mila Homer MD [id number]:[id number]   Surgical pcr screen     Status: Abnormal   Collection Time: 04/19/15 10:30 PM  Result Value Ref Range  Status   MRSA, PCR NEGATIVE NEGATIVE Final   Staphylococcus aureus POSITIVE (A) NEGATIVE Final    Comment:        The Xpert SA Assay (FDA approved for NASAL specimens in patients over 96 years of age), is one component of a comprehensive surveillance program.  Test performance has been validated by [facility] for patients greater than or equal to 87 year old. It is not intended to diagnose infection nor to guide or monitor treatment.      Studies: Dg Chest 2 View  04/19/2015   CLINICAL DATA:  Congestion.  Fever.  EXAM: CHEST  2 VIEW  COMPARISON:  04/16/2015  FINDINGS: There is an air-fluid level in the right upper lung zone. This is compatible with fluid within the cavitary right upper lobe lung mass. The amount of fluid has significantly increased since the prior study. Fluid obscures the right upper lobe mass partially. Left lung is clear. Normal heart size. No pneumothorax.  IMPRESSION: Increasing fluid in the right upper lobe cavitary lung mass worrisome for infection.   Electronically Signed   By: Jolaine Click M.D.   On: 04/19/2015 08:06    Scheduled Meds: . [START ON 04/21/2015] Chlorhexidine Gluconate Cloth  6 each Topical Q0600  . dolutegravir  50 mg Oral Daily  . emtricitabine-tenofovir  1 tablet Oral Daily  . enoxaparin (LOVENOX) injection  40 mg Subcutaneous Q24H  . feeding supplement (ENSURE ENLIVE)  237 mL Oral BID BM  . levofloxacin (LEVAQUIN) IV  750 mg Intravenous Q24H  . mupirocin ointment   Nasal BID   Continuous Infusions: . sodium chloride Stopped (04/18/15 2335)      Time spent: 25 minutes    Chavis Tessler  [facility] Pager [phone]. If 7PM-7AM, please contact night-coverage at [email or web address], password [facility] 04/20/2015, 9:48 AM  LOS: 4 days

## 2015-04-20 NOTE — Progress Notes (Signed)
Utilization review complete 

## 2015-04-20 NOTE — Progress Notes (Signed)
  Subjective: Patient examined and chest CT reviewed Patient has RUL lung abscess which is increasing in size and causing sepsis despite long term antibiotics and no improvement after bronchoscopy  He will need VATS and RUL lobectomy during this hospitalization Will schedule for surgery Tues am 4-26 I have discussed the procedure with the patient and he agrees  Objective: Vital signs in last 24 hours: Temp:  [98.5 F (36.9 C)-101.4 F (38.6 C)] 99.1 F (37.3 C) (04/23 2100) Pulse Rate:  [76-86] 76 (04/23 2100) Cardiac Rhythm:  [-]  Resp:  [14-18] 18 (04/23 2100) BP: (112-119)/(64-71) 119/66 mmHg (04/23 2100) SpO2:  [100 %] 100 % (04/23 2100)  Hemodynamic parameters for last 24 hours:   febrile 101 Intake/Output from previous day: 04/22 0701 - 04/23 0700 In: 630 [P.O.:480; IV Piggyback:150] Out: 700 [Urine:700] Intake/Output this shift:   Thin AA male NAD No cervical adenopathy Cor with RRR , no murmur Neuro intact   Lab Results:  Recent Labs  04/18/15 0030 04/19/15 0521  WBC 13.3* 12.8*  HGB 11.7* 11.6*  HCT 31.6* 31.5*  PLT 159 161   BMET:  Recent Labs  04/18/15 0030  NA 134*  K 3.5  CL 101  CO2 22  GLUCOSE 118*  BUN 9  CREATININE 1.15  CALCIUM 8.1*    PT/INR: No results for input(s): LABPROT, INR in the last 72 hours. ABG    Component Value Date/Time   PHART 7.442 04/20/2015 0500   HCO3 26.0* 04/20/2015 0500   TCO2 27.2 04/20/2015 0500   O2SAT 93.6 04/20/2015 0500   CBG (last 3)  No results for input(s): GLUCAP in the last 72 hours.  Assessment/Plan: lung abscess RUL- increasing despite medical therapy, bronchoscopies Plan- RUL lobectomy 4-26   LOS: 4 days    Kathlee Nations Trigt III 04/20/2015

## 2015-04-21 LAB — PROCALCITONIN: Procalcitonin: 0.26 ng/mL

## 2015-04-21 NOTE — Progress Notes (Signed)
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 Patient septic with ongoing fever.   Assessment/Plan: Sepsis secondary to postobstructive PNA with increased right upper lobe lung mass - fever persistent but Tmax improved.   abx changed to levaquin by ISD for broad coverage. -Appreciate pulmonary and thoracic sx eval. For RUL lobectomy on 4/26. -Supportive care with antitussives and Tylenol. -follow AFB cx from recent bx. Cultures on admission have been negative.    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 Counseled strongly on cessation. Plans to quit. Refused nicotine patch.  HIV Continue ART. Follows with Dr. Drue Second.   Hyperkalemia resolved  DVT prophylaxis  Diet: Regular  Code Status: Full code Family Communication: none at bedside Disposition Plan: OR on 4/26   Consultants:  Pulmonary  ID  CTVS  Procedures:  CT chest  Antibiotics:  IV vancomycin and Zosyn x1  unasyn 4/20-4/22  levaquin 4/22--  HPI/Subjective: tmax of 101.4 F. still has Rt upper back pain with movement and deep inspiration   Objective: Filed Vitals:   04/21/15 1248  BP:   Pulse:   Temp: 99.1 F (37.3 C)  Resp:     Intake/Output Summary (Last 24 hours) at 04/21/15 1329 Last data filed at 04/21/15 0651  Gross per 24 hour  Intake 1051.67 ml  Output    600 ml  Net 451.67 ml   Filed Weights   04/16/15 1455 04/16/15 2009  Weight:  59.875 kg (132 lb) 64.003 kg (141 lb 1.6 oz)    Exam:   General: middle aged male no acute distress  HEENT: , moist oral mucosa, supple neck  Chest: Diminished breath sounds over right lung, no added sounds  CVS: Normal S1 and S2, no murmur or gallop  GI: Soft, nondistended, nontender, BS+  Musculoskeletal: Warm, no edema    Data Reviewed: Basic Metabolic Panel:  Recent Labs Lab 04/14/15 1746 04/16/15 1514 04/17/15 0720 04/18/15 0030  NA 133* 133* 137 134*  K 3.3* 5.6* 4.0 3.5  CL 97 101 105 101  CO2 GLUCOSE 95 113* 111* 118*  BUN CREATININE 1.09 1.13 1.18 1.15  CALCIUM 8.6 8.5 8.4 8.1*   Liver Function Tests:  Recent Labs Lab 04/16/15 1514  AST 54*  ALT 40  ALKPHOS 79  BILITOT 2.5*  PROT 6.7  ALBUMIN 2.7*   No results for input(s): LIPASE, AMYLASE in the last 168 hours. No results for input(s): AMMONIA in the last 168 hours. CBC:  Recent Labs Lab 04/14/15 1746 04/16/15 1514 04/17/15 0720 04/18/15 0030 04/19/15 0521  WBC 10.7* 9.5 11.1* 13.3* 12.8*  NEUTROABS  --  8.1* 8.2* 10.8*  --   HGB 12.8* 11.7* 11.7* 11.7* 11.6*  HCT 35.2* 32.5* 32.1* 31.6* 31.5*  MCV 86.9 88.6 87.2 87.1 85.8  PLT 208 199 161 159 161   Cardiac Enzymes: No results for input(s): CKTOTAL, CKMB, CKMBINDEX, TROPONINI in the last 168 hours. BNP (last 3 results) No results for  input(s): BNP in the last 8760 hours.  ProBNP (last 3 results) No results for input(s): PROBNP in the last 8760 hours.  CBG: No results for input(s): GLUCAP in the last 168 hours.  Recent Results (from the past 240 hour(s))  Culture, blood (routine x 2)     Status: None (Preliminary result)   Collection Time: 04/16/15  3:14 PM  Result Value Ref Range Status   Specimen Description BLOOD LEFT ARM  Final   Special Requests BOTTLES DRAWN AEROBIC AND ANAEROBIC  Final   Culture   Final           BLOOD CULTURE RECEIVED NO GROWTH TO DATE CULTURE WILL BE HELD FOR 5 DAYS  BEFORE ISSUING A FINAL NEGATIVE REPORT Performed at [facility]    Report Status PENDING  Incomplete  Urine culture     Status: None   Collection Time: 04/16/15  6:02 PM  Result Value Ref Range Status   Specimen Description URINE, RANDOM  Final   Special Requests NONE  Final   Colony Count NO GROWTH Performed at [facility]   Final   Culture NO GROWTH Performed at [facility]   Final   Report Status 04/18/2015 FINAL  Final  Culture, blood (routine x 2)     Status: None (Preliminary result)   Collection Time: 04/16/15  6:15 PM  Result Value Ref Range Status   Specimen Description BLOOD RIGHT ARM  Final   Special Requests BOTTLES DRAWN AEROBIC AND ANAEROBIC 5CC  Final   Culture   Final           BLOOD CULTURE RECEIVED NO GROWTH TO DATE CULTURE WILL BE HELD FOR 5 DAYS BEFORE ISSUING A FINAL NEGATIVE REPORT Performed at [facility]    Report Status PENDING  Incomplete  Culture, blood (routine x 2) Call MD if unable to obtain prior to antibiotics being given     Status: None (Preliminary result)   Collection Time: 04/16/15  9:10 PM  Result Value Ref Range Status   Specimen Description BLOOD RIGHT HAND  Final   Special Requests   Final    BOTTLES DRAWN AEROBIC AND ANAEROBIC 2CC BLUE 1CC RED   Culture   Final           BLOOD CULTURE RECEIVED NO GROWTH TO DATE CULTURE WILL BE HELD FOR 5 DAYS BEFORE ISSUING A FINAL NEGATIVE REPORT Note: Culture results may be compromised due to an inadequate volume of blood received in culture bottles. Performed at [facility]    Report Status PENDING  Incomplete  Culture, blood (routine x 2) Call MD if unable to obtain prior to antibiotics being given     Status: None (Preliminary result)   Collection Time: 04/16/15  9:20 PM  Result Value Ref Range Status   Specimen Description BLOOD LEFT HAND  Final   Special Requests   Final    BOTTLES DRAWN AEROBIC AND ANAEROBIC 3CC BLUE 2CC RED   Culture   Final            BLOOD CULTURE RECEIVED NO GROWTH TO DATE CULTURE WILL BE HELD FOR 5 DAYS BEFORE ISSUING A FINAL NEGATIVE REPORT Note: Culture results may be compromised due to an inadequate volume of blood received in culture bottles. Performed at [facility]    Report Status PENDING  Incomplete  Respiratory virus antigens panel     Status: None   Collection Time: 04/16/15  9:47 PM  Result  Value Ref Range Status   Source - RVPAN NASAL SWAB  Corrected   Respiratory Syncytial Virus A Negative Negative Final   Respiratory Syncytial Virus B Negative Negative Final   Influenza A Negative Negative Final   Influenza B Negative Negative Final   Parainfluenza 1 Negative Negative Final   Parainfluenza 2 Negative Negative Final   Parainfluenza 3 Negative Negative Final   Metapneumovirus Negative Negative Final   Rhinovirus Negative Negative Final   Adenovirus Negative Negative Final    Comment: (NOTE) Performed At: [facility] [address] [place], [place] [id number] Mila Homer MD [id number]   Bordetella pertussis PCR     Status: None   Collection Time: 04/16/15  9:52 PM  Result Value Ref Range Status   B pertussis, DNA Negative Negative Final   B parapertussis, DNA Negative Negative Final    Comment: (NOTE) This test was developed and its performance characteristics determined by [employer] [facility]. It has not been cleared or approved by the U.S. Food and Drug Administration. The FDA has determined that such clearance or approval is not necessary. This test is used for clinical purposes. It should not be regarded as investigational or research. Performed At: [facility] [address] [place], [place] [id number] Mila Homer MD [id number]:[id number]   Surgical pcr screen     Status: Abnormal   Collection Time: 04/19/15 10:30 PM  Result Value Ref Range Status   MRSA, PCR NEGATIVE NEGATIVE Final   Staphylococcus aureus POSITIVE (A) NEGATIVE Final     Comment:        The Xpert SA Assay (FDA approved for NASAL specimens in patients over 73 years of age), is one component of a comprehensive surveillance program.  Test performance has been validated by [facility] for patients greater than or equal to 24 year old. It is not intended to diagnose infection nor to guide or monitor treatment.      Studies: No results found.  Scheduled Meds: . Chlorhexidine Gluconate Cloth  6 each Topical Q0600  . dolutegravir  50 mg Oral Daily  . emtricitabine-tenofovir  1 tablet Oral Daily  . enoxaparin (LOVENOX) injection  40 mg Subcutaneous Q24H  . feeding supplement (ENSURE ENLIVE)  237 mL Oral BID BM  . levofloxacin (LEVAQUIN) IV  750 mg Intravenous Q24H  . mupirocin cream   Topical BID  . mupirocin ointment   Nasal BID   Continuous Infusions: . sodium chloride 100 mL/hr at 04/21/15 0517      Time spent: 25 minutes    Malessa Zartman  [facility] Pager [phone]. If 7PM-7AM, please contact night-coverage at [email or web address], password [facility] 04/21/2015, 1:29 PM  LOS: 5 days

## 2015-04-22 DIAGNOSIS — J852 Abscess of lung without pneumonia: Secondary | ICD-10-CM

## 2015-04-22 DIAGNOSIS — R0789 Other chest pain: Secondary | ICD-10-CM

## 2015-04-22 DIAGNOSIS — Z01810 Encounter for preprocedural cardiovascular examination: Secondary | ICD-10-CM

## 2015-04-22 LAB — URINALYSIS, ROUTINE W REFLEX MICROSCOPIC
Bilirubin Urine: NEGATIVE
Glucose, UA: NEGATIVE mg/dL
Ketones, ur: NEGATIVE mg/dL
Leukocytes, UA: NEGATIVE
Nitrite: NEGATIVE
Protein, ur: 100 mg/dL — AB
Specific Gravity, Urine: 1.012 (ref 1.005–1.030)
Urobilinogen, UA: 1 mg/dL (ref 0.0–1.0)
pH: 7.5 (ref 5.0–8.0)

## 2015-04-22 LAB — COMPREHENSIVE METABOLIC PANEL
ALT: 73 U/L — ABNORMAL HIGH (ref 0–53)
AST: 52 U/L — ABNORMAL HIGH (ref 0–37)
Albumin: 2 g/dL — ABNORMAL LOW (ref 3.5–5.2)
Alkaline Phosphatase: 94 U/L (ref 39–117)
Anion gap: 9 (ref 5–15)
BUN: 9 mg/dL (ref 6–23)
CO2: 28 mmol/L (ref 19–32)
Calcium: 8.3 mg/dL — ABNORMAL LOW (ref 8.4–10.5)
Chloride: 98 mmol/L (ref 96–112)
Creatinine, Ser: 0.95 mg/dL (ref 0.50–1.35)
GFR calc Af Amer: >90 mL/min (ref >90)
GFR calc non Af Amer: >90 mL/min (ref >90)
Glucose, Bld: 106 mg/dL — ABNORMAL HIGH (ref 70–99)
Potassium: 3.5 mmol/L (ref 3.5–5.1)
Sodium: 135 mmol/L (ref 135–145)
Total Bilirubin: 0.4 mg/dL (ref 0.3–1.2)
Total Protein: 6.3 g/dL (ref 6.0–8.3)

## 2015-04-22 LAB — CBC
HCT: 29.3 % — ABNORMAL LOW (ref 39.0–52.0)
Hemoglobin: 10.8 g/dL — ABNORMAL LOW (ref 13.0–17.0)
MCH: 31.6 pg (ref 26.0–34.0)
MCHC: 36.9 g/dL — ABNORMAL HIGH (ref 30.0–36.0)
MCV: 85.7 fL (ref 78.0–100.0)
Platelets: 221 10*3/uL (ref 150–400)
RBC: 3.42 MIL/uL — ABNORMAL LOW (ref 4.22–5.81)
RDW: 13.1 % (ref 11.5–15.5)
WBC: 7.7 10*3/uL (ref 4.0–10.5)

## 2015-04-22 LAB — BLOOD GAS, ARTERIAL
Acid-Base Excess: 3.9 mmol/L — ABNORMAL HIGH (ref 0.0–2.0)
Bicarbonate: 27 mEq/L — ABNORMAL HIGH (ref 20.0–24.0)
Drawn by: 406621
FIO2: 0.21 %
O2 Saturation: 96.2 %
Patient temperature: 98.6
TCO2: 28 mmol/L (ref 0–100)
pCO2 arterial: 34.4 mmHg — ABNORMAL LOW (ref 35.0–45.0)
pH, Arterial: 7.506 — ABNORMAL HIGH (ref 7.350–7.450)
pO2, Arterial: 87.5 mmHg (ref 80.0–100.0)

## 2015-04-22 LAB — URINE MICROSCOPIC-ADD ON

## 2015-04-22 LAB — ABO/RH: ABO/RH(D): O POS

## 2015-04-22 LAB — PROTIME-INR
INR: 1.36 (ref 0.00–1.49)
Prothrombin Time: 16.9 seconds — ABNORMAL HIGH (ref 11.6–15.2)

## 2015-04-22 LAB — APTT: aPTT: 38 seconds — ABNORMAL HIGH (ref 24–37)

## 2015-04-22 LAB — PREPARE RBC (CROSSMATCH)

## 2015-04-22 MED ORDER — LIDOCAINE-PRILOCAINE 2.5-2.5 % EX CREA
1.0000 "application " | TOPICAL_CREAM | Freq: Once | CUTANEOUS | Status: DC
  Filled 2015-04-22: qty 5

## 2015-04-22 MED ORDER — MIDAZOLAM HCL 2 MG/2ML IJ SOLN: 0.5000 mg | INTRAMUSCULAR | Status: DC | PRN

## 2015-04-22 MED ORDER — DEXTROSE 5 % IV SOLN
1.5000 g | INTRAVENOUS | Status: AC
  Administered 2015-04-23: 1.5 g via INTRAVENOUS
  Filled 2015-04-22 (×2): qty 1.5

## 2015-04-22 MED ORDER — OXYMETAZOLINE HCL 0.05 % NA SOLN
2.0000 | Freq: Once | NASAL | Status: DC
  Filled 2015-04-22: qty 15

## 2015-04-22 MED ORDER — MIDAZOLAM HCL 2 MG/2ML IJ SOLN: 1.0000 mg | INTRAMUSCULAR | Status: DC | PRN

## 2015-04-22 NOTE — Progress Notes (Addendum)
[facility]  Date of Admission:  04/16/2015  Antibiotics: levaquin since 4/24  Subjective: Still with pleuritic chest pain, anxious to get surgery done  Objective: Temp:  [98.2 F (36.8 C)-99.3 F (37.4 C)] 98.8 F (37.1 C) (04/25 1343) Pulse Rate:  [67-69] 68 (04/25 1344) Resp:  [18] 18 (04/25 1343) BP: (103-106)/(56-71) 106/71 mmHg (04/25 1343) SpO2:  [97 %-98 %] 98 % (04/25 1344)  General: awake, alert, nad Skin: no rashes Lungs: CTA B Cor: rrr Abdomen: soft, nt, nd   Lab Results Lab Results  Component Value Date   WBC 12.8* 04/19/2015   HGB 11.6* 04/19/2015   HCT 31.5* 04/19/2015   MCV 85.8 04/19/2015   PLT 161 04/19/2015    Lab Results  Component Value Date   CREATININE 1.15 04/18/2015   BUN 9 04/18/2015   NA 134* 04/18/2015   K 3.5 04/18/2015   CL 101 04/18/2015   CO2 22 04/18/2015    Lab Results  Component Value Date   ALT 40 04/16/2015   AST 54* 04/16/2015   ALKPHOS 79 04/16/2015   BILITOT 2.5* 04/16/2015      Microbiology: Recent Results (from the past 240 hour(s))  Culture, blood (routine x 2)     Status: None (Preliminary result)   Collection Time: 04/16/15  3:14 PM  Result Value Ref Range Status   Specimen Description BLOOD LEFT ARM  Final   Special Requests BOTTLES DRAWN AEROBIC AND ANAEROBIC  Final   Culture   Final           BLOOD CULTURE RECEIVED NO GROWTH TO DATE CULTURE WILL BE HELD FOR 5 DAYS BEFORE ISSUING A FINAL NEGATIVE REPORT Performed at [facility]    Report Status PENDING  Incomplete  Urine culture     Status: None   Collection Time: 04/16/15  6:02 PM  Result Value Ref Range Status   Specimen Description URINE, RANDOM  Final   Special Requests NONE  Final   Colony Count NO GROWTH Performed at [facility]   Final   Culture NO GROWTH Performed at [facility]   Final   Report Status 04/18/2015 FINAL  Final  Culture, blood (routine x 2)     Status: None  (Preliminary result)   Collection Time: 04/16/15  6:15 PM  Result Value Ref Range Status   Specimen Description BLOOD RIGHT ARM  Final   Special Requests BOTTLES DRAWN AEROBIC AND ANAEROBIC 5CC  Final   Culture   Final           BLOOD CULTURE RECEIVED NO GROWTH TO DATE CULTURE WILL BE HELD FOR 5 DAYS BEFORE ISSUING A FINAL NEGATIVE REPORT Performed at [facility]    Report Status PENDING  Incomplete  Culture, blood (routine x 2) Call MD if unable to obtain prior to antibiotics being given     Status: None (Preliminary result)   Collection Time: 04/16/15  9:10 PM  Result Value Ref Range Status   Specimen Description BLOOD RIGHT HAND  Final   Special Requests   Final    BOTTLES DRAWN AEROBIC AND ANAEROBIC 2CC BLUE 1CC RED   Culture   Final           BLOOD CULTURE RECEIVED NO GROWTH TO DATE CULTURE WILL BE HELD FOR 5 DAYS BEFORE ISSUING A FINAL NEGATIVE REPORT Note: Culture results may be compromised due to an inadequate volume of blood received in culture bottles. Performed at  [facility]    Report Status PENDING  Incomplete  Culture, blood (routine x 2) Call MD if unable to obtain prior to antibiotics being given     Status: None (Preliminary result)   Collection Time: 04/16/15  9:20 PM  Result Value Ref Range Status   Specimen Description BLOOD LEFT HAND  Final   Special Requests   Final    BOTTLES DRAWN AEROBIC AND ANAEROBIC 3CC BLUE 2CC RED   Culture   Final           BLOOD CULTURE RECEIVED NO GROWTH TO DATE CULTURE WILL BE HELD FOR 5 DAYS BEFORE ISSUING A FINAL NEGATIVE REPORT Note: Culture results may be compromised due to an inadequate volume of blood received in culture bottles. Performed at [facility]    Report Status PENDING  Incomplete  Respiratory virus antigens panel     Status: None   Collection Time: 04/16/15  9:47 PM  Result Value Ref Range Status   Source - RVPAN NASAL SWAB  Corrected   Respiratory Syncytial Virus A Negative Negative  Final   Respiratory Syncytial Virus B Negative Negative Final   Influenza A Negative Negative Final   Influenza B Negative Negative Final   Parainfluenza 1 Negative Negative Final   Parainfluenza 2 Negative Negative Final   Parainfluenza 3 Negative Negative Final   Metapneumovirus Negative Negative Final   Rhinovirus Negative Negative Final   Adenovirus Negative Negative Final    Comment: (NOTE) Performed At: [facility] [address] [place], [place] [id number] Mila Homer MD [id number]   Bordetella pertussis PCR     Status: None   Collection Time: 04/16/15  9:52 PM  Result Value Ref Range Status   B pertussis, DNA Negative Negative Final   B parapertussis, DNA Negative Negative Final    Comment: (NOTE) This test was developed and its performance characteristics determined by [facility]. It has not been cleared or approved by the U.S. Food and Drug Administration. The FDA has determined that such clearance or approval is not necessary. This test is used for clinical purposes. It should not be regarded as investigational or research. Performed At: [facility] [address] [place], [place] [id number] Mila Homer MD [id number]   Surgical pcr screen     Status: Abnormal   Collection Time: 04/19/15 10:30 PM  Result Value Ref Range Status   MRSA, PCR NEGATIVE NEGATIVE Final   Staphylococcus aureus POSITIVE (A) NEGATIVE Final    Comment:        The Xpert SA Assay (FDA approved for NASAL specimens in patients over 51 years of age), is one component of a comprehensive surveillance program.  Test performance has been validated by [facility] for patients greater than or equal to 9 year old. It is not intended to diagnose infection nor to guide or monitor treatment.     Studies/Results: No results found.  Assessment/Plan:  1) pulmonary mass - seems to be infection.  Worsening on CXR. Now afebrile on levaquin. Nothing from  Bronchoscopy positive.   VATS, resection by CVTS tomorrow.   Both pathology and cultures for AFB, fungal, bacterial.    2) HIV - on Tivicay and Truvada.  Last viral load 81 and CD4 of 320, well controlled.    Staci Righter, MD [facility] [email or web address] [phone] pager   [phone] cell 04/22/2015, 2:36 PM

## 2015-04-22 NOTE — Progress Notes (Signed)
  Echocardiogram 2D Echocardiogram has been performed.  Rodney Walker 04/22/2015, 10:18 AM

## 2015-04-22 NOTE — Consult Note (Signed)
Name: Rodney Walker MRN: [id number] DOB: [birth year]    ADMISSION DATE:  04/16/2015 CONSULTATION DATE:  04/17/2015  REFERRING MD :  Dr. 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  PCCM consulted for evaluation of mass.    SIGNIFICANT EVENTS  Fall 2015  >> Dx with RUL mass like opacity  09/05/14  FOB >> per Dr. Sung Amabile, fungating mass RUL, bx c/w inflammation, granulomatous inflammation on brushing 04/10/15  FOB >> per Dr. Kendrick Fries, benign lung tissue w/o evidence of malignancy 04/15/15  Seen in ER for fevers, cough, SOB, Rx'd with doxycycline x 5 days from Dr. Kendrick Fries  STUDIES:  3/31  CT Chest >> mass like consolidation in suprahilar RUL with surrounding nodularity 4/19  CT Chest >> increased mass-like opacity in RUL, post-obstructive pneumonitis, severe emphysema R>L, scattered bilateral pulmonary nodules  SUBJECTIVE:    Still has pleuritic pain on Rt,  Iecreased.  Has cough with sputum,  decreased.  VITAL SIGNS: Temp:  [98.2 F (36.8 C)-99.3 F (37.4 C)] 99.3 F (37.4 C) (04/25 0528) Pulse Rate:  [67-82] 67 (04/25 0528) Resp:  [18-20] 18 (04/25 0528) BP: (103-128)/(56-86) 103/56 mmHg (04/25 0528) SpO2:  [97 %-100 %] 97 % (04/25 0528)  PHYSICAL EXAMINATION: General: pleasant, complains of increased back pain Neuro: normal strength HEENT: no oral lesions Cardiovascular: regular Lungs: no wheeze, decreased BS RUL  Abdomen: soft, non tender Musculoskeletal: no edema Skin: no rashes    Recent Labs Lab 04/16/15 1514 04/17/15 0720 04/18/15 0030  NA 133* 137 134*  K 5.6* 4.0 3.5  CL 101 105 101  CO2 23 25 22   BUN 9 8 9   CREATININE 1.13 1.18 1.15  GLUCOSE 113* 111* 118*    Recent Labs Lab 04/17/15 0720 04/18/15 0030 04/19/15 0521  HGB 11.7*  11.7* 11.6*  HCT 32.1* 31.6* 31.5*  WBC 11.1* 13.3* 12.8*  PLT 161 159 161   No results found. 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  He was treated for pneumonia in April 2015.  His CT chest findings were relatively stable on imaging from 03/28/15, but had significant progression on CT chest 04/16/15.  This was associated with new symptoms of fever, cough, sputum, and Rt sided pleuritic chest pain.  I suspect he developed a bacterial pneumonia in the setting of a stable Rt upper lung mass, rather than progression of his Rt upper lung mass.  He has improved clinically with antibiotics.   ASSESSMENT / PLAN:  BAL fungal 4/13 >> BAL AFB 4/13 >> Bordetella swab 4/19 >>neg Adenovirus Ab 4/19 >> neg Legionella urine Ag 4/19 >>  Blood 4/19 >> Pneumococcal urine Ag 4/19 >> negative RSV panel 4/19 >> negative Influenza PCR 4/19 >> negative Legionella total Ab 4/19 >> negative  Probably Bacterial PNA: worsening on CXR but WBC improving on levofloxacin.  - Continue levo, ID following  - F/U on cultures.  - F/U PCT, trending down  ?RUL mass: repeat bronchoscopies less than helpful.  - CXR worsened on 4/22 with air fluid levels, likely an abscess of infected bleb.  - Continue abx.  - CVTS to perform  VATs with right upper lobectomy 4/26.  Bullous emphysema: significant for age.  Very severe.  - Smoking cessation both cigarettes and THC.  - PFTs as outpatient.  Joneen Roach, AGACNP-BC [facility] Pulmonology/Critical Care Pager [phone] or [phone]  04/22/2015 10:56 AM

## 2015-04-22 NOTE — Progress Notes (Signed)
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 Patient septic with ongoing fever.   Assessment/Plan: Sepsis secondary to postobstructive PNA with increased right upper lobe lung mass -Continue Levaquin for broad coverage. Sepsis resolved and Afebrile for the past 24 hours. -Appreciate pulmonary and thoracic sx eval. For RUL lobectomy on 4/26. -Supportive care with antitussives and Tylenol. -follow AFB cx from recent bx. Cultures on admission and from recent bronchoscopy have been negative. -2-D echo preoperatively done shows EF of 50-50% with hypokinesis of mid apical anterior septal myocardium.  -No signs or symptoms of CHF or cardiac ischemia. doesnot need perioperative BB or further cardiac w/up.   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 Counseled strongly on cessation. Plans to quit. Refused nicotine patch.   HIV Continue ART. Follows with Dr. Drue Second.   Hyperkalemia resolved  DVT prophylaxis  Diet: Regular  Code Status: Full code Family Communication: Roommate at bedside  Disposition Plan: Right upper lung lobectomy on 4/26 Consultants:  Pulmonary  ID  CTVS  Procedures:  CT chest  Antibiotics:  IV vancomycin and Zosyn x1  unasyn 4/20-4/22  levaquin 4/22--  HPI/Subjective: Sent afebrile for the past 24 hours. Reports feeling much better. Anxious about surgery tomorrow.   Objective: Filed Vitals:   04/22/15  1344  BP:   Pulse: 68  Temp:   Resp:     Intake/Output Summary (Last 24 hours) at 04/22/15 1528 Last data filed at 04/22/15 1355  Gross per 24 hour  Intake   2915 ml  Output   1400 ml  Net   1515 ml   Filed Weights   04/16/15 1455 04/16/15 2009  Weight: 59.875 kg (132 lb) 64.003 kg (141 lb 1.6 oz)    Exam:   General: middle aged male no acute distress  HEENT: moist oral mucosa, supple neck  Chest: Diminished breath sounds over right lung, no added sounds  CVS: Normal S1 and S2, no murmur or gallop  GI: Soft, nondistended, nontender,   Musculoskeletal: Warm, no edema    Data Reviewed: Basic Metabolic Panel:  Recent Labs Lab 04/16/15 1514 04/17/15 0720 04/18/15 0030  NA 133* 137 134*  K 5.6* 4.0 3.5  CL 101 105 101  CO2 GLUCOSE 113* 111* 118*  BUN CREATININE 1.13 1.18 1.15  CALCIUM 8.5 8.4 8.1*   Liver Function Tests:  Recent Labs Lab 04/16/15 1514  AST 54*  ALT 40  ALKPHOS 79  BILITOT 2.5*  PROT 6.7  ALBUMIN 2.7*   No results for input(s): LIPASE, AMYLASE in the last 168 hours. No results for input(s): AMMONIA in the last 168 hours. CBC:  Recent Labs Lab 04/16/15 1514 04/17/15 0720 04/18/15 0030 04/19/15 0521  WBC 9.5 11.1* 13.3* 12.8*  NEUTROABS 8.1* 8.2* 10.8*  --   HGB 11.7* 11.7* 11.7* 11.6*  HCT 32.5* 32.1* 31.6* 31.5*  MCV 88.6 87.2 87.1 85.8  PLT 199 161 159 161   Cardiac Enzymes: No results for input(s):  CKTOTAL, CKMB, CKMBINDEX, TROPONINI in the last 168 hours. BNP (last 3 results) No results for input(s): BNP in the last 8760 hours.  ProBNP (last 3 results) No results for input(s): PROBNP in the last 8760 hours.  CBG: No results for input(s): GLUCAP in the last 168 hours.  Recent Results (from the past 240 hour(s))  Culture, blood (routine x 2)     Status: None (Preliminary result)   Collection Time: 04/16/15  3:14 PM  Result Value Ref Range Status   Specimen Description BLOOD LEFT ARM  Final    Special Requests BOTTLES DRAWN AEROBIC AND ANAEROBIC  Final   Culture   Final           BLOOD CULTURE RECEIVED NO GROWTH TO DATE CULTURE WILL BE HELD FOR 5 DAYS BEFORE ISSUING A FINAL NEGATIVE REPORT Performed at [facility]    Report Status PENDING  Incomplete  Urine culture     Status: None   Collection Time: 04/16/15  6:02 PM  Result Value Ref Range Status   Specimen Description URINE, RANDOM  Final   Special Requests NONE  Final   Colony Count NO GROWTH Performed at [facility]   Final   Culture NO GROWTH Performed at [facility]   Final   Report Status 04/18/2015 FINAL  Final  Culture, blood (routine x 2)     Status: None (Preliminary result)   Collection Time: 04/16/15  6:15 PM  Result Value Ref Range Status   Specimen Description BLOOD RIGHT ARM  Final   Special Requests BOTTLES DRAWN AEROBIC AND ANAEROBIC 5CC  Final   Culture   Final           BLOOD CULTURE RECEIVED NO GROWTH TO DATE CULTURE WILL BE HELD FOR 5 DAYS BEFORE ISSUING A FINAL NEGATIVE REPORT Performed at [facility]    Report Status PENDING  Incomplete  Culture, blood (routine x 2) Call MD if unable to obtain prior to antibiotics being given     Status: None (Preliminary result)   Collection Time: 04/16/15  9:10 PM  Result Value Ref Range Status   Specimen Description BLOOD RIGHT HAND  Final   Special Requests   Final    BOTTLES DRAWN AEROBIC AND ANAEROBIC 2CC BLUE 1CC RED   Culture   Final           BLOOD CULTURE RECEIVED NO GROWTH TO DATE CULTURE WILL BE HELD FOR 5 DAYS BEFORE ISSUING A FINAL NEGATIVE REPORT Note: Culture results may be compromised due to an inadequate volume of blood received in culture bottles. Performed at [facility]    Report Status PENDING  Incomplete  Culture, blood (routine x 2) Call MD if unable to obtain prior to antibiotics being given     Status: None (Preliminary result)   Collection Time: 04/16/15  9:20 PM  Result Value Ref  Range Status   Specimen Description BLOOD LEFT HAND  Final   Special Requests   Final    BOTTLES DRAWN AEROBIC AND ANAEROBIC 3CC BLUE 2CC RED   Culture   Final           BLOOD CULTURE RECEIVED NO GROWTH TO DATE CULTURE WILL BE HELD FOR 5 DAYS BEFORE ISSUING A FINAL NEGATIVE REPORT Note: Culture results may be compromised due to an inadequate volume of blood received in culture bottles. Performed at [facility]    Report Status PENDING  Incomplete  Respiratory virus antigens panel  Status: None   Collection Time: 04/16/15  9:47 PM  Result Value Ref Range Status   Source - RVPAN NASAL SWAB  Corrected   Respiratory Syncytial Virus A Negative Negative Final   Respiratory Syncytial Virus B Negative Negative Final   Influenza A Negative Negative Final   Influenza B Negative Negative Final   Parainfluenza 1 Negative Negative Final   Parainfluenza 2 Negative Negative Final   Parainfluenza 3 Negative Negative Final   Metapneumovirus Negative Negative Final   Rhinovirus Negative Negative Final   Adenovirus Negative Negative Final    Comment: (NOTE) Performed At: [facility] [address] [place], [place] [id number] Mila Homer MD [id number]   Bordetella pertussis PCR     Status: None   Collection Time: 04/16/15  9:52 PM  Result Value Ref Range Status   B pertussis, DNA Negative Negative Final   B parapertussis, DNA Negative Negative Final    Comment: (NOTE) This test was developed and its performance characteristics determined by [employer] [facility]. It has not been cleared or approved by the U.S. Food and Drug Administration. The FDA has determined that such clearance or approval is not necessary. This test is used for clinical purposes. It should not be regarded as investigational or research. Performed At: [facility] [address] [place], [place] [id number] Mila Homer MD WK:[id number]   Surgical pcr screen     Status: Abnormal    Collection Time: 04/19/15 10:30 PM  Result Value Ref Range Status   MRSA, PCR NEGATIVE NEGATIVE Final   Staphylococcus aureus POSITIVE (A) NEGATIVE Final    Comment:        The Xpert SA Assay (FDA approved for NASAL specimens in patients over 25 years of age), is one component of a comprehensive surveillance program.  Test performance has been validated by [facility] for patients greater than or equal to 5 year old. It is not intended to diagnose infection nor to guide or monitor treatment.      Studies: No results found.  Scheduled Meds: . Chlorhexidine Gluconate Cloth  6 each Topical Q0600  . dolutegravir  50 mg Oral Daily  . emtricitabine-tenofovir  1 tablet Oral Daily  . enoxaparin (LOVENOX) injection  40 mg Subcutaneous Q24H  . feeding supplement (ENSURE ENLIVE)  237 mL Oral BID BM  . levofloxacin (LEVAQUIN) IV  750 mg Intravenous Q24H  . lidocaine-prilocaine  1 application Topical Once  . mupirocin cream   Topical BID  . mupirocin ointment   Nasal BID  . oxymetazoline  2 spray Each Nare Once   Continuous Infusions: . sodium chloride 1,000 mL (04/22/15 1519)      Time spent: 25 minutes    Taleeyah Bora  [facility] Pager [phone]. If 7PM-7AM, please contact night-coverage at [email or web address], password [facility] 04/22/2015, 3:28 PM  LOS: 6 days

## 2015-04-23 ENCOUNTER — Encounter (HOSPITAL_COMMUNITY): Payer: Self-pay | Admitting: Certified Registered Nurse Anesthetist

## 2015-04-23 ENCOUNTER — Inpatient Hospital Stay (HOSPITAL_COMMUNITY): Payer: Self-pay | Admitting: Anesthesiology

## 2015-04-23 ENCOUNTER — Encounter (HOSPITAL_COMMUNITY)
Admission: EM | Disposition: A | Discharge status: Home / Self Care | Payer: Self-pay | Source: Home / Self Care | Attending: Internal Medicine

## 2015-04-23 ENCOUNTER — Inpatient Hospital Stay: Payer: No Typology Code available for payment source | Admitting: Family Medicine

## 2015-04-23 ENCOUNTER — Inpatient Hospital Stay (HOSPITAL_COMMUNITY): Payer: Self-pay

## 2015-04-23 DIAGNOSIS — J852 Abscess of lung without pneumonia: Secondary | ICD-10-CM

## 2015-04-23 HISTORY — PX: VIDEO ASSISTED THORACOSCOPY (VATS)/ LOBECTOMY: SHX6169

## 2015-04-23 LAB — CULTURE, BLOOD (ROUTINE X 2)
CULTURE: NO GROWTH
CULTURE: NO GROWTH
CULTURE: NO GROWTH
Culture: NO GROWTH

## 2015-04-23 LAB — POCT I-STAT 3, ART BLOOD GAS (G3+)
Bicarbonate: 25.5 mEq/L — ABNORMAL HIGH (ref 20.0–24.0)
O2 Saturation: 97 %
PO2 ART: 94 mmHg (ref 80.0–100.0)
TCO2: 27 mmol/L (ref 0–100)
pCO2 arterial: 45.5 mmHg — ABNORMAL HIGH (ref 35.0–45.0)
pH, Arterial: 7.357 (ref 7.350–7.450)

## 2015-04-23 LAB — PREPARE RBC (CROSSMATCH)

## 2015-04-23 SURGERY — VIDEO ASSISTED THORACOSCOPY (VATS)/ LOBECTOMY
Anesthesia: General | Site: Chest | Laterality: Right

## 2015-04-23 MED ORDER — CETYLPYRIDINIUM CHLORIDE 0.05 % MT LIQD
7.0000 mL | Freq: Two times a day (BID) | OROMUCOSAL | Status: DC
  Administered 2015-04-23 – 2015-04-28 (×9): 7 mL via OROMUCOSAL

## 2015-04-23 MED ORDER — KETOROLAC TROMETHAMINE 30 MG/ML IJ SOLN
INTRAMUSCULAR | Status: DC | PRN
  Administered 2015-04-23: 30 mg via INTRAVENOUS

## 2015-04-23 MED ORDER — FENTANYL CITRATE (PF) 250 MCG/5ML IJ SOLN
INTRAMUSCULAR | Status: AC
  Filled 2015-04-23: qty 5

## 2015-04-23 MED ORDER — NEOSTIGMINE METHYLSULFATE 10 MG/10ML IV SOLN
INTRAVENOUS | Status: AC
  Filled 2015-04-23: qty 1

## 2015-04-23 MED ORDER — DIPHENHYDRAMINE HCL 50 MG/ML IJ SOLN: 12.5000 mg | Freq: Four times a day (QID) | INTRAMUSCULAR | Status: DC | PRN

## 2015-04-23 MED ORDER — HYDROMORPHONE HCL 1 MG/ML IJ SOLN
0.2500 mg | INTRAMUSCULAR | Status: DC | PRN
  Administered 2015-04-23: 1 mg via INTRAVENOUS

## 2015-04-23 MED ORDER — SODIUM CHLORIDE 0.9 % IJ SOLN
10.0000 mL | Freq: Two times a day (BID) | INTRAMUSCULAR | Status: DC
  Administered 2015-04-23 – 2015-04-28 (×10): 10 mL via INTRAVENOUS

## 2015-04-23 MED ORDER — ACETAMINOPHEN 500 MG PO TABS
1000.0000 mg | ORAL_TABLET | Freq: Four times a day (QID) | ORAL | Status: AC
  Administered 2015-04-23 – 2015-04-28 (×18): 1000 mg via ORAL
  Filled 2015-04-23 (×23): qty 2

## 2015-04-23 MED ORDER — ROCURONIUM BROMIDE 50 MG/5ML IV SOLN
INTRAVENOUS | Status: AC
  Filled 2015-04-23: qty 1

## 2015-04-23 MED ORDER — LIDOCAINE HCL (CARDIAC) 20 MG/ML IV SOLN
INTRAVENOUS | Status: DC | PRN
  Administered 2015-04-23: 80 mg via INTRAVENOUS

## 2015-04-23 MED ORDER — FENTANYL CITRATE (PF) 100 MCG/2ML IJ SOLN
INTRAMUSCULAR | Status: DC | PRN
Start: 2015-04-23 — End: 2015-04-23
  Administered 2015-04-23 (×11): 50 ug via INTRAVENOUS

## 2015-04-23 MED ORDER — ACETAMINOPHEN 160 MG/5ML PO SOLN
1000.0000 mg | Freq: Four times a day (QID) | ORAL | Status: AC
  Administered 2015-04-24 (×2): 1000 mg via ORAL
  Filled 2015-04-23: qty 40

## 2015-04-23 MED ORDER — PIPERACILLIN-TAZOBACTAM 3.375 G IVPB
3.3750 g | Freq: Three times a day (TID) | INTRAVENOUS | Status: DC
  Administered 2015-04-23 – 2015-04-25 (×6): 3.375 g via INTRAVENOUS
  Filled 2015-04-23 (×8): qty 50

## 2015-04-23 MED ORDER — BUPIVACAINE ON-Q PAIN PUMP (FOR ORDER SET NO CHG)
INJECTION | Status: DC
  Filled 2015-04-23: qty 1

## 2015-04-23 MED ORDER — PHENYLEPHRINE HCL 10 MG/ML IJ SOLN
10.0000 mg | INTRAVENOUS | Status: DC | PRN
  Administered 2015-04-23: 5 ug/min via INTRAVENOUS

## 2015-04-23 MED ORDER — HYDROMORPHONE HCL 1 MG/ML IJ SOLN
INTRAMUSCULAR | Status: AC
  Filled 2015-04-23: qty 1

## 2015-04-23 MED ORDER — GLYCOPYRROLATE 0.2 MG/ML IJ SOLN
INTRAMUSCULAR | Status: AC
  Filled 2015-04-23: qty 3

## 2015-04-23 MED ORDER — HEMOSTATIC AGENTS (NO CHARGE) OPTIME
TOPICAL | Status: DC | PRN
  Administered 2015-04-23: 1 via TOPICAL

## 2015-04-23 MED ORDER — PROPOFOL 10 MG/ML IV BOLUS
INTRAVENOUS | Status: AC
  Filled 2015-04-23: qty 20

## 2015-04-23 MED ORDER — PROMETHAZINE HCL 25 MG/ML IJ SOLN: 6.2500 mg | INTRAMUSCULAR | Status: DC | PRN

## 2015-04-23 MED ORDER — SENNOSIDES-DOCUSATE SODIUM 8.6-50 MG PO TABS
1.0000 | ORAL_TABLET | Freq: Every day | ORAL | Status: DC
  Administered 2015-04-23 – 2015-04-27 (×4): 1 via ORAL
  Filled 2015-04-23 (×6): qty 1

## 2015-04-23 MED ORDER — TRAMADOL HCL 50 MG PO TABS: 50.0000 mg | ORAL_TABLET | Freq: Four times a day (QID) | ORAL | Status: DC | PRN

## 2015-04-23 MED ORDER — FENTANYL 10 MCG/ML IV SOLN
INTRAVENOUS | Status: DC
  Administered 2015-04-23: 170 ug via INTRAVENOUS
  Administered 2015-04-23 (×2): via INTRAVENOUS
  Administered 2015-04-23: 230 ug via INTRAVENOUS
  Administered 2015-04-24: 170 ug via INTRAVENOUS
  Administered 2015-04-24: 190 ug via INTRAVENOUS
  Administered 2015-04-24: 240 ug via INTRAVENOUS
  Administered 2015-04-24: 100 ug via INTRAVENOUS
  Administered 2015-04-24: 07:00:00 via INTRAVENOUS
  Administered 2015-04-24: 150 ug via INTRAVENOUS
  Administered 2015-04-24: 170 ug via INTRAVENOUS
  Administered 2015-04-25 (×2): 100 ug via INTRAVENOUS
  Administered 2015-04-25: 70 ug via INTRAVENOUS
  Administered 2015-04-25: 120 ug via INTRAVENOUS
  Administered 2015-04-25: 110 ug via INTRAVENOUS
  Administered 2015-04-25: 130 ug via INTRAVENOUS
  Administered 2015-04-26: 120 ug via INTRAVENOUS
  Administered 2015-04-26: 50 ug via INTRAVENOUS
  Administered 2015-04-26: 100 ug via INTRAVENOUS
  Administered 2015-04-26: 123.9 ug via INTRAVENOUS
  Administered 2015-04-26: 110 ug via INTRAVENOUS
  Administered 2015-04-26: 120 ug via INTRAVENOUS
  Administered 2015-04-27: 70 ug via INTRAVENOUS
  Administered 2015-04-27: 30 ug via INTRAVENOUS
  Administered 2015-04-27: 09:00:00 via INTRAVENOUS
  Administered 2015-04-27: 100 ug via INTRAVENOUS
  Administered 2015-04-27: 50 ug via INTRAVENOUS
  Administered 2015-04-27: 40 ug via INTRAVENOUS
  Administered 2015-04-27: 110 ug via INTRAVENOUS
  Administered 2015-04-28: 100 ug via INTRAVENOUS
  Administered 2015-04-28: 50 ug via INTRAVENOUS
  Administered 2015-04-28: 90 ug via INTRAVENOUS
  Administered 2015-04-28: 40 ug via INTRAVENOUS
  Filled 2015-04-23 (×7): qty 50

## 2015-04-23 MED ORDER — KETOROLAC TROMETHAMINE 30 MG/ML IJ SOLN
INTRAMUSCULAR | Status: AC
  Filled 2015-04-23: qty 1

## 2015-04-23 MED ORDER — MIDAZOLAM HCL 5 MG/5ML IJ SOLN
INTRAMUSCULAR | Status: DC | PRN
  Administered 2015-04-23: 2 mg via INTRAVENOUS

## 2015-04-23 MED ORDER — OXYCODONE HCL 5 MG PO TABS
5.0000 mg | ORAL_TABLET | ORAL | Status: DC | PRN
  Administered 2015-04-25 – 2015-04-28 (×5): 10 mg via ORAL
  Filled 2015-04-23 (×6): qty 2

## 2015-04-23 MED ORDER — VECURONIUM BROMIDE 10 MG IV SOLR
INTRAVENOUS | Status: DC | PRN
  Administered 2015-04-23: 1 mg via INTRAVENOUS
  Administered 2015-04-23: 2 mg via INTRAVENOUS
  Administered 2015-04-23: 1 mg via INTRAVENOUS

## 2015-04-23 MED ORDER — ONDANSETRON HCL 4 MG/2ML IJ SOLN
INTRAMUSCULAR | Status: DC | PRN
  Administered 2015-04-23: 4 mg via INTRAVENOUS

## 2015-04-23 MED ORDER — 0.9 % SODIUM CHLORIDE (POUR BTL) OPTIME
TOPICAL | Status: DC | PRN
  Administered 2015-04-23: 2000 mL

## 2015-04-23 MED ORDER — CHLORHEXIDINE GLUCONATE CLOTH 2 % EX PADS
6.0000 | MEDICATED_PAD | Freq: Once | CUTANEOUS | Status: AC
  Administered 2015-04-24: 6 via TOPICAL

## 2015-04-23 MED ORDER — PROPOFOL 10 MG/ML IV BOLUS
INTRAVENOUS | Status: DC | PRN
  Administered 2015-04-23: 140 mg via INTRAVENOUS

## 2015-04-23 MED ORDER — BUPIVACAINE 0.5 % ON-Q PUMP SINGLE CATH 400 ML
400.0000 mL | INJECTION | Status: DC
  Filled 2015-04-23: qty 400

## 2015-04-23 MED ORDER — KCL IN DEXTROSE-NACL 20-5-0.45 MEQ/L-%-% IV SOLN
INTRAVENOUS | Status: DC
  Administered 2015-04-24: 02:00:00 via INTRAVENOUS
  Administered 2015-04-25: 30 mL/h via INTRAVENOUS
  Administered 2015-04-27: 09:00:00 via INTRAVENOUS
  Filled 2015-04-23 (×8): qty 1000

## 2015-04-23 MED ORDER — BISACODYL 5 MG PO TBEC
10.0000 mg | DELAYED_RELEASE_TABLET | Freq: Every day | ORAL | Status: DC
  Administered 2015-04-24 – 2015-04-28 (×4): 10 mg via ORAL
  Filled 2015-04-23 (×5): qty 2

## 2015-04-23 MED ORDER — VANCOMYCIN HCL IN DEXTROSE 750-5 MG/150ML-% IV SOLN
750.0000 mg | Freq: Three times a day (TID) | INTRAVENOUS | Status: DC
Start: 2015-04-23 — End: 2015-04-25
  Administered 2015-04-23 – 2015-04-25 (×6): 750 mg via INTRAVENOUS
  Filled 2015-04-23 (×8): qty 150

## 2015-04-23 MED ORDER — NEOSTIGMINE METHYLSULFATE 10 MG/10ML IV SOLN
INTRAVENOUS | Status: DC | PRN
  Administered 2015-04-23: 4 mg via INTRAVENOUS

## 2015-04-23 MED ORDER — MIDAZOLAM HCL 2 MG/2ML IJ SOLN
INTRAMUSCULAR | Status: AC
  Filled 2015-04-23: qty 2

## 2015-04-23 MED ORDER — SODIUM CHLORIDE 0.9 % IJ SOLN: 9.0000 mL | INTRAMUSCULAR | Status: DC | PRN

## 2015-04-23 MED ORDER — MUPIROCIN 2 % EX OINT
TOPICAL_OINTMENT | Freq: Two times a day (BID) | CUTANEOUS | Status: AC
  Administered 2015-04-23: 21:00:00 via NASAL
  Administered 2015-04-24 (×2): 1 via NASAL
  Filled 2015-04-23 (×2): qty 22

## 2015-04-23 MED ORDER — VECURONIUM BROMIDE 10 MG IV SOLR
INTRAVENOUS | Status: AC
  Filled 2015-04-23: qty 10

## 2015-04-23 MED ORDER — BUPIVACAINE 0.5 % ON-Q PUMP SINGLE CATH 400 ML
INJECTION | Status: DC | PRN
  Administered 2015-04-23: 400 mL

## 2015-04-23 MED ORDER — ONDANSETRON HCL 4 MG/2ML IJ SOLN
INTRAMUSCULAR | Status: AC
  Filled 2015-04-23: qty 2

## 2015-04-23 MED ORDER — NALOXONE HCL 0.4 MG/ML IJ SOLN: 0.4000 mg | INTRAMUSCULAR | Status: DC | PRN

## 2015-04-23 MED ORDER — GLYCOPYRROLATE 0.2 MG/ML IJ SOLN
INTRAMUSCULAR | Status: DC | PRN
  Administered 2015-04-23: 0.6 mg via INTRAVENOUS

## 2015-04-23 MED ORDER — ARTIFICIAL TEARS OP OINT
TOPICAL_OINTMENT | OPHTHALMIC | Status: AC
  Filled 2015-04-23: qty 3.5

## 2015-04-23 MED ORDER — ROCURONIUM BROMIDE 100 MG/10ML IV SOLN
INTRAVENOUS | Status: DC | PRN
  Administered 2015-04-23: 50 mg via INTRAVENOUS

## 2015-04-23 MED ORDER — DIPHENHYDRAMINE HCL 12.5 MG/5ML PO ELIX
12.5000 mg | ORAL_SOLUTION | Freq: Four times a day (QID) | ORAL | Status: DC | PRN
  Filled 2015-04-23: qty 5

## 2015-04-23 MED ORDER — POTASSIUM CHLORIDE 10 MEQ/50ML IV SOLN: 10.0000 meq | Freq: Every day | INTRAVENOUS | Status: DC | PRN

## 2015-04-23 MED ORDER — KETOROLAC TROMETHAMINE 15 MG/ML IJ SOLN
15.0000 mg | Freq: Four times a day (QID) | INTRAMUSCULAR | Status: AC
  Administered 2015-04-23 – 2015-04-25 (×7): 15 mg via INTRAVENOUS
  Filled 2015-04-23 (×8): qty 1

## 2015-04-23 MED ORDER — LIDOCAINE HCL (CARDIAC) 20 MG/ML IV SOLN
INTRAVENOUS | Status: AC
  Filled 2015-04-23: qty 5

## 2015-04-23 MED ORDER — ONDANSETRON HCL 4 MG/2ML IJ SOLN
4.0000 mg | Freq: Four times a day (QID) | INTRAMUSCULAR | Status: DC | PRN
  Administered 2015-04-27 – 2015-04-28 (×2): 4 mg via INTRAVENOUS
  Filled 2015-04-23: qty 2

## 2015-04-23 MED ORDER — ONDANSETRON HCL 4 MG/2ML IJ SOLN
4.0000 mg | Freq: Four times a day (QID) | INTRAMUSCULAR | Status: DC | PRN
  Administered 2015-04-25: 4 mg via INTRAVENOUS
  Filled 2015-04-23 (×2): qty 2

## 2015-04-23 MED ORDER — LACTATED RINGERS IV SOLN
INTRAVENOUS | Status: DC | PRN
  Administered 2015-04-23 (×4): via INTRAVENOUS

## 2015-04-23 SURGICAL SUPPLY — 95 items
APPLICATOR TIP EXT COSEAL (VASCULAR PRODUCTS) IMPLANT
BAG DECANTER FOR FLEXI CONT (MISCELLANEOUS) IMPLANT
BLADE CLIPPER SURG (BLADE) ×3 IMPLANT
BLADE SURG 11 STRL SS (BLADE) ×3 IMPLANT
CANISTER SUCTION 2500CC (MISCELLANEOUS) ×3 IMPLANT
CATH KIT ON Q 5IN SLV (PAIN MANAGEMENT) IMPLANT
CATH KIT ON-Q SILVERSOAK 7.5IN (CATHETERS) ×3 IMPLANT
CATH THORACIC 28FR (CATHETERS) IMPLANT
CATH THORACIC 36FR (CATHETERS) ×3 IMPLANT
CATH THORACIC 36FR RT ANG (CATHETERS) ×3 IMPLANT
CLIP TI MEDIUM 24 (CLIP) ×3 IMPLANT
CLIP TI WIDE RED SMALL 24 (CLIP) ×3 IMPLANT
CONN ST 1/4X3/8  BEN (MISCELLANEOUS) ×2
CONN ST 1/4X3/8 BEN (MISCELLANEOUS) ×1 IMPLANT
CONN Y 3/8X3/8X3/8  BEN (MISCELLANEOUS) ×2
CONN Y 3/8X3/8X3/8 BEN (MISCELLANEOUS) ×1 IMPLANT
CONT SPEC 4OZ CLIKSEAL STRL BL (MISCELLANEOUS) ×3 IMPLANT
COVER SURGICAL LIGHT HANDLE (MISCELLANEOUS) ×3 IMPLANT
DERMABOND ADVANCED (GAUZE/BANDAGES/DRESSINGS) ×2
DERMABOND ADVANCED .7 DNX12 (GAUZE/BANDAGES/DRESSINGS) ×1 IMPLANT
DRAIN CHANNEL 32F RND 10.7 FF (WOUND CARE) ×3 IMPLANT
DRAPE LAPAROSCOPIC ABDOMINAL (DRAPES) ×3 IMPLANT
DRAPE SLUSH/WARMER DISC (DRAPES) ×3 IMPLANT
DRAPE WARM FLUID 44X44 (DRAPE) IMPLANT
ELECT BLADE 4.0 EZ CLEAN MEGAD (MISCELLANEOUS)
ELECT BLADE 6.5 EXT (BLADE) ×3 IMPLANT
ELECT REM PT RETURN 9FT ADLT (ELECTROSURGICAL) ×3
ELECTRODE BLDE 4.0 EZ CLN MEGD (MISCELLANEOUS) IMPLANT
ELECTRODE REM PT RTRN 9FT ADLT (ELECTROSURGICAL) ×1 IMPLANT
GAUZE SPONGE 4X4 12PLY STRL (GAUZE/BANDAGES/DRESSINGS) ×3 IMPLANT
GLOVE BIO SURGEON STRL SZ 6 (GLOVE) ×6 IMPLANT
GLOVE BIO SURGEON STRL SZ7.5 (GLOVE) ×6 IMPLANT
GLOVE BIOGEL PI IND STRL 6 (GLOVE) ×1 IMPLANT
GLOVE BIOGEL PI IND STRL 6.5 (GLOVE) ×2 IMPLANT
GLOVE BIOGEL PI IND STRL 7.0 (GLOVE) ×5 IMPLANT
GLOVE BIOGEL PI IND STRL 7.5 (GLOVE) ×1 IMPLANT
GLOVE BIOGEL PI INDICATOR 6 (GLOVE) ×2
GLOVE BIOGEL PI INDICATOR 6.5 (GLOVE) ×4
GLOVE BIOGEL PI INDICATOR 7.0 (GLOVE) ×10
GLOVE BIOGEL PI INDICATOR 7.5 (GLOVE) ×2
GOWN STRL REUS W/ TWL LRG LVL3 (GOWN DISPOSABLE) ×5 IMPLANT
GOWN STRL REUS W/TWL LRG LVL3 (GOWN DISPOSABLE) ×10
HANDLE STAPLE ENDO GIA SHORT (STAPLE) ×4
HEMOSTAT SURGICEL 2X14 (HEMOSTASIS) IMPLANT
KIT BASIN OR (CUSTOM PROCEDURE TRAY) ×3 IMPLANT
KIT ROOM TURNOVER OR (KITS) ×3 IMPLANT
KIT SUCTION CATH 14FR (SUCTIONS) ×3 IMPLANT
NS IRRIG 1000ML POUR BTL (IV SOLUTION) ×6 IMPLANT
PACK CHEST (CUSTOM PROCEDURE TRAY) ×3 IMPLANT
PAD ARMBOARD 7.5X6 YLW CONV (MISCELLANEOUS) ×9 IMPLANT
RELOAD EGIA 45 MED/THCK PURPLE (STAPLE) ×3 IMPLANT
RELOAD EGIA 60 MED/THCK PURPLE (STAPLE) ×3 IMPLANT
RELOAD EGIA TRIS TAN 45 CVD (STAPLE) ×15 IMPLANT
SEALANT PROGEL (MISCELLANEOUS) IMPLANT
SEALANT SURG COSEAL 4ML (VASCULAR PRODUCTS) IMPLANT
SEALANT SURG COSEAL 8ML (VASCULAR PRODUCTS) ×3 IMPLANT
SOLUTION ANTI FOG 6CC (MISCELLANEOUS) ×3 IMPLANT
SPONGE GAUZE 4X4 12PLY STER LF (GAUZE/BANDAGES/DRESSINGS) ×3 IMPLANT
SPONGE INTESTINAL PEANUT (DISPOSABLE) ×6 IMPLANT
SPONGE LAP 18X18 X RAY DECT (DISPOSABLE) ×3 IMPLANT
SPONGE TONSIL 1.25 RF SGL STRG (GAUZE/BANDAGES/DRESSINGS) ×6 IMPLANT
STAPLER ENDO GIA 12MM SHORT (STAPLE) ×2 IMPLANT
STAPLER TA30 4.8 NON-ABS (STAPLE) ×3 IMPLANT
SUT CHROMIC 3 0 SH 27 (SUTURE) ×6 IMPLANT
SUT ETHILON 3 0 PS 1 (SUTURE) IMPLANT
SUT PROLENE 3 0 SH DA (SUTURE) IMPLANT
SUT PROLENE 4 0 RB 1 (SUTURE)
SUT PROLENE 4-0 RB1 .5 CRCL 36 (SUTURE) IMPLANT
SUT PROLENE 5 0 C 1 36 (SUTURE) ×6 IMPLANT
SUT PROLENE 6 0 C 1 30 (SUTURE) ×9 IMPLANT
SUT SILK  1 MH (SUTURE) ×8
SUT SILK 1 MH (SUTURE) ×4 IMPLANT
SUT SILK 1 TIES 10X30 (SUTURE) ×3 IMPLANT
SUT SILK 2 0SH CR/8 30 (SUTURE) IMPLANT
SUT SILK 3 0SH CR/8 30 (SUTURE) IMPLANT
SUT VIC AB 1 CTX 18 (SUTURE) ×6 IMPLANT
SUT VIC AB 1 CTX 36 (SUTURE) ×2
SUT VIC AB 1 CTX36XBRD ANBCTR (SUTURE) ×1 IMPLANT
SUT VIC AB 2 TP1 27 (SUTURE) IMPLANT
SUT VIC AB 2-0 CTX 36 (SUTURE) ×3 IMPLANT
SUT VIC AB 3-0 SH 18 (SUTURE) ×3 IMPLANT
SUT VIC AB 3-0 X1 27 (SUTURE) ×3 IMPLANT
SUT VICRYL 0 UR6 27IN ABS (SUTURE) IMPLANT
SUT VICRYL 2 TP 1 (SUTURE) ×3 IMPLANT
SWAB COLLECTION DEVICE MRSA (MISCELLANEOUS) ×3 IMPLANT
SYSTEM SAHARA CHEST DRAIN ATS (WOUND CARE) ×3 IMPLANT
TAPE CLOTH SURG 4X10 WHT LF (GAUZE/BANDAGES/DRESSINGS) ×3 IMPLANT
TIP APPLICATOR SPRAY EXTEND 16 (VASCULAR PRODUCTS) IMPLANT
TOWEL OR 17X24 6PK STRL BLUE (TOWEL DISPOSABLE) ×3 IMPLANT
TOWEL OR 17X26 10 PK STRL BLUE (TOWEL DISPOSABLE) ×3 IMPLANT
TRAP SPECIMEN MUCOUS 40CC (MISCELLANEOUS) ×3 IMPLANT
TRAY FOLEY CATH 16FRSI W/METER (SET/KITS/TRAYS/PACK) ×3 IMPLANT
TUBE ANAEROBIC SPECIMEN COL (MISCELLANEOUS) ×3 IMPLANT
TUNNELER SHEATH ON-Q 11GX8 DSP (PAIN MANAGEMENT) ×6 IMPLANT
WATER STERILE IRR 1000ML POUR (IV SOLUTION) ×6 IMPLANT

## 2015-04-23 NOTE — Anesthesia Procedure Notes (Signed)
Procedure Name: Intubation Date/Time: 04/23/2015 7:51 AM Performed by: Roney Mans P Pre-anesthesia Checklist: Patient identified, Timeout performed, Emergency Drugs available, Suction available and Patient being monitored Patient Re-evaluated:Patient Re-evaluated prior to inductionOxygen Delivery Method: Circle system utilized Preoxygenation: Pre-oxygenation with 100% oxygen Intubation Type: IV induction Ventilation: Mask ventilation without difficulty Laryngoscope Size: Mac and 4 Grade View: Grade II Tube type: Oral Endobronchial tube: Left, Double lumen EBT, EBT position confirmed by auscultation, Bronchial Blocker placed under direct vision and EBT position confirmed by fiberoptic bronchoscope and 39 Fr Number of attempts: 1 Airway Equipment and Method: Stylet and Fiberoptic brochoscope Placement Confirmation: ETT inserted through vocal cords under direct vision,  breath sounds checked- equal and bilateral and positive ETCO2 Secured at: 31 cm Tube secured with: Tape Dental Injury: Teeth and Oropharynx as per pre-operative assessment

## 2015-04-23 NOTE — Progress Notes (Signed)
Patient in OR all day , so was not seen or examined today. Pt now under CTVS service. Will follow if needed.

## 2015-04-23 NOTE — Anesthesia Preprocedure Evaluation (Addendum)
Anesthesia Evaluation  Patient identified by MRN, date of birth, ID band Patient awake    Reviewed: Allergy & Precautions, NPO status , Patient's Chart, lab work & pertinent test results  History of Anesthesia Complications Negative for: history of anesthetic complications  Airway Mallampati: I  TM Distance: >3 FB Neck ROM: Full    Dental no notable dental hx. (+) Teeth Intact, Dental Advisory Given   Pulmonary pneumonia -, COPDCurrent Smoker,  breath sounds clear to auscultation        Cardiovascular + CAD and + Past MI Rhythm:Regular Rate:Normal  Echo 04/22/2015 Study Conclusions  - Left ventricle: The cavity size was normal. Wall thickness wasnormal. Systolic function was normal. The estimated ejectionfraction was in the range of 50% to 55%. There is hypokinesis ofthe mid-apicalanteroseptal myocardium. Left ventricular diastolicfunction parameters were normal. - Pericardium, extracardiac: A trivial pericardial effusion wasidentified.  Impressions: - Hypokinesis of the distal anteroseptal wall with overall preserved LV function; trace MR and TR.    Neuro/Psych negative neurological ROS  negative psych ROS   GI/Hepatic negative GI ROS, Neg liver ROS,   Endo/Other  negative endocrine ROS  Renal/GU negative Renal ROS     Musculoskeletal negative musculoskeletal ROS (+)   Abdominal   Peds  Hematology  (+) HIV,   Anesthesia Other Findings   Reproductive/Obstetrics negative OB ROS                         Anesthesia Physical Anesthesia Plan  ASA: III  Anesthesia Plan: General   Post-op Pain Management:    Induction: Intravenous  Airway Management Planned: Double Lumen EBT  Additional Equipment:   Intra-op Plan:   Post-operative Plan: Extubation in OR  Informed Consent: I have reviewed the patients History and Physical, chart, labs and discussed the procedure including  the risks, benefits and alternatives for the proposed anesthesia with the patient or authorized representative who has indicated his/her understanding and acceptance.   Dental advisory given  Plan Discussed with: CRNA and Surgeon  Anesthesia Plan Comments:         Anesthesia Quick Evaluation

## 2015-04-23 NOTE — Anesthesia Postprocedure Evaluation (Signed)
  Anesthesia Post-op Note  Patient: Rodney Walker  Procedure(s) Performed: Procedure(s) (LRB): VIDEO ASSISTED THORACOSCOPY (VATS)/RIGHT upper  and right middle LOBECTOMY (Right)  Patient Location: PACU  Anesthesia Type: General  Level of Consciousness: awake and alert   Airway and Oxygen Therapy: Patient Spontanous Breathing  Post-op Pain: mild  Post-op Assessment: Post-op Vital signs reviewed, Patient's Cardiovascular Status Stable, Respiratory Function Stable, Patent Airway and No signs of Nausea or vomiting  Last Vitals:  Filed Vitals:   04/23/15 1230  BP:   Pulse: 88  Temp:   Resp:     Post-op Vital Signs: stable   Complications: No apparent anesthesia complications

## 2015-04-23 NOTE — Progress Notes (Addendum)
The patient was examined and preop studies reviewed. There has been no change from the prior exam and the patient is ready for surgery.  Plan RUL lobectomy on [place]

## 2015-04-23 NOTE — Brief Op Note (Signed)
04/16/2015 - 04/23/2015  11:11 AM  PATIENT:  Rodney Walker  34 y.o. male  PRE-OPERATIVE DIAGNOSIS:  RUL LUNG ABCESS  POST-OPERATIVE DIAGNOSIS:  RUL LUNG ABCESS  PROCEDURE:   RIGHT THORACOTOMY RIGHT UPPER AND MIDDLE LOBECTOMIES  SURGEON:  Kerin Perna, MD  ASSISTANT: Coral Ceo, PA-C  ANESTHESIA:   general  SPECIMEN:  Source of Specimen:  Right upper and middle lobes  DISPOSITION OF SPECIMEN:  Pathology  DRAINS: 36 Fr CTs x 2, 32 Blake drain  PATIENT CONDITION:  PACU - hemodynamically stable.

## 2015-04-23 NOTE — Op Note (Signed)
NAMENAJEH, CREDIT NO.:  [id number]  MEDICAL RECORD NO.:  [id number]  LOCATION:  2S16C                        FACILITY:  [facility]  PHYSICIAN:  Kerin Perna, M.D.  DATE OF BIRTH:  12-Nov-1981  DATE OF PROCEDURE:  04/23/2015 DATE OF DISCHARGE:                              OPERATIVE REPORT   OPERATION: 1. Right video-assisted thoracic surgery, right thoracotomy with right     upper lobectomy and right middle lobectomy. 2. Placement of wound On-Q pain analgesia system.  SURGEON:  Kerin Perna, M.D.  ASSISTANT:  Coral Ceo, PA-C.  ANESTHESIA:  General.  PREOPERATIVE DIAGNOSIS:  Right upper lobe lung abscess with inflammation and involvement of the right middle lobe.  POSTOPERATIVE DIAGNOSIS:  Right upper lobe lung abscess with inflammation and involvement of the right middle lobe.  CLINICAL NOTE:  The patient is a 34 year old, African American male with history of HIV infection, controlled on meds who has suffered from enlarging right upper lobe abscess for the past 16 months.  He was recently hospitalized with fever, shortness of breath, malaise, and chest pain.  CT scan showed enlargement of the right lung abscess which had been treated with antibiotics previously and had been treated with bronchoscopy at least 2 occasions without improvement.  Thoracic surgical evaluation for resection was recommended by the pulmonologist caring for the patient.  I examined the patient in his hospital room and reviewed results of his CT scan and chest x-rays.  I discussed the indications and expected benefits of right thoracotomy and right upper lobe and right middle lobectomy for resection of the chronic lung abscess.  I discussed the major aspects of the surgery including the location of the surgical incision, use of general anesthesia, and the postoperative care which would require chest tube drains.  I discussed the potential complications of surgery  including risks of bleeding, recurrent infection, prolonged air leak, postoperative pulmonary problems including empyema or pneumonia or wound infection, or the risk of death. After reviewing these issues, he demonstrated his understanding and agreed to proceed with surgery under what I felt was an informed consent.  FINDINGS:  Large immobile right upper lobe mass from the periphery, lungs, extending into the hilum.  OPERATIVE PROCEDURE:  The patient was brought to the operating room and placed supine on the table, general anesthesia was induced.  A double- lumen endotracheal tube was positioned by the Anesthesia team.  The patient was turned to expose right side up.  This was prepped and draped as a sterile field.  A proper time-out was performed.  A posterolateral thoracotomy incision was made from the tip of the scapula for 10-12 cm anteriorly in the fifth interspace.  Exposure of the lung mass was very difficult due to his large size and immobility and for that reason, the fifth rib was divided posteriorly.  This helped exposure significantly. I next made an incision into the thinned out portion of the right upper lobe to remove some of the air and a large bleb.  Infected material and blood was suctioned out and this decompressed the right upper lobe, so that there was better exposure.  The infected material - fluid and the blood was sent for  cultures as well as cytology.  Next, the dissection of the hilum was started.  The mediastinal pleura around the hemiazygos vein down to the SVC was opened.  Carefully, the hilar vessels were dissected out.  The pulmonary veins to the upper lobe and middle lobe were identified and these were encircled with a vessel loop stapled and divided with the Endo-GIA stapling devices.  The main pulmonary artery was exposed and the pulmonary artery to the right upper lobe and its branches to the right middle lobe was carefully dissected and encircled  with a vessel loop, stapled and divided with an Endo-GIA stapling device.  Next, the pulmonary artery was followed down into the fissure and the fissure between the middle lobe and lower lobe was opened anteriorly. The pulmonary artery to the superior segment of the right lower lobe and to the basilar segments of the right lower lobe was identified and preserved.  The posterior ascending branch to the upper lobe was identified in the fissure, doubly ligated and divided.  The fissure between the middle lobe and lower lobe posteriorly was then developed and stapled and divided with the Endo-GIA stapling device.  This left the bronchus to the right upper lobe and right middle lobe.  This bronchus came from a common trunk.  The bronchus was stapled with a TA- 30 stapling device and prior to deploying the staple line, adequate ventilation of the right lower lobe was confirmed by ventilating the lung with bronchus to the upper and middle lobe clamp.  After the staple line was placed, the bronchus was divided distally and specimen was removed.  It was sent to pathology where it showed inflammatory changes in the mass and bronchial margin negative for cancer. The staple lines were reinforced with a thin layer of biologic adhesives - Coseal.  Hemostasis was achieved at the hilum.  The entire hemithorax was irrigated with 3 L of warm saline to remove any contaminated material.  The inferior ligament was divided to help improve mobility of lower lobe.  The superior segment had a fissure from the remainder of the lower lobe and these 2 segments of the lung were joined with some interrupted chromic.  The hemithorax was drained with 3 chest tubes placed anteriorly, posteriorly, and in the costophrenic angle and brought out through separate incisions and secured to the skin.  The ribs were then reapproximated with #2 Vicryl.  Prior to tying the sutures, the lungs were re-expanded and filled the  hemithorax nicely. The ribs were closed.  Next, the muscle layers were closed in layers using interrupted #1 Vicryl.  The subcutaneous and skin layers were closed in running Vicryl.  An On-Q catheter was placed just beneath the main incision above the chest tube sites secured to the skin and connected to a reservoir of 0.5% Marcaine.  Sterile dressings were applied.  The patient then was turned supine.  The chest tubes were connected to the Pleur-Evac.  The patient was extubated and returned recovery room.     Kerin Perna, M.D.     PV/MEDQ  D:  04/23/2015  T:  04/23/2015  Job:  [id number]

## 2015-04-23 NOTE — Progress Notes (Signed)
ANTIBIOTIC CONSULT NOTE - INITIAL  Pharmacy Consult for Vancomycin and Zosyn Indication: lung abscess  No Known Allergies  Patient Measurements: Height: 5\' 4"  (162.6 cm) Weight: 141 lb 1.6 oz (64.003 kg) IBW/kg (Calculated) : 59.2  Vital Signs: Temp: 98.7 F (37.1 C) (04/26 1300) Temp Source: Oral (04/26 0545) BP: 119/80 mmHg (04/26 1345) Pulse Rate: 69 (04/26 1345) Intake/Output from previous day: 04/25 0701 - 04/26 0700 In: 100 [P.O.:100] Out: 300 [Urine:300] Intake/Output from this shift: Total I/O In: 2300 [I.V.:2300] Out: 1845 [Urine:1275; Blood:500; Chest Tube:70]  Labs:  Recent Labs  04/22/15 1951  WBC 7.7  HGB 10.8*  PLT 221  CREATININE 0.95   Estimated Creatinine Clearance: 91.7 mL/min (by C-G formula based on Cr of 0.95). No results for input(s): VANCOTROUGH, VANCOPEAK, VANCORANDOM, GENTTROUGH, GENTPEAK, GENTRANDOM, TOBRATROUGH, TOBRAPEAK, TOBRARND, AMIKACINPEAK, AMIKACINTROU, AMIKACIN in the last 72 hours.   Microbiology: Recent Results (from the past 720 hour(s))  AFB culture with smear     Status: None (Preliminary result)   Collection Time: 04/10/15  8:45 AM  Result Value Ref Range Status   Specimen Description BRONCHIAL ALVEOLAR LAVAGE  Final   Special Requests Immunocompromised  Final   Acid Fast Smear   Final    NO ACID FAST BACILLI SEEN Performed at [facility]    Culture   Final    CULTURE WILL BE EXAMINED FOR 6 WEEKS BEFORE ISSUING A FINAL REPORT Performed at [facility]    Report Status PENDING  Incomplete  Fungus Culture with Smear     Status: None (Preliminary result)   Collection Time: 04/10/15  8:45 AM  Result Value Ref Range Status   Specimen Description BRONCHIAL ALVEOLAR LAVAGE  Final   Special Requests Immunocompromised  Final   Fungal Smear   Final    NO YEAST OR FUNGAL ELEMENTS SEEN Performed at [facility]    Culture   Final    CULTURE IN PROGRESS FOR FOUR WEEKS Performed at [facility]    Report Status PENDING  Incomplete  Culture, respiratory (NON-Expectorated)     Status: None   Collection Time: 04/10/15  8:45 AM  Result Value Ref Range Status   Specimen Description BRONCHIAL ALVEOLAR LAVAGE  Final   Special Requests Immunocompromised  Final   Gram Stain   Final    NO WBC SEEN RARE SQUAMOUS EPITHELIAL CELLS PRESENT RARE GRAM POSITIVE COCCI IN PAIRS IN CLUSTERS Performed at [facility]    Culture   Final    Non-Pathogenic Oropharyngeal-type Flora Isolated. Performed at [facility]    Report Status 04/12/2015 FINAL  Final  Culture, blood (routine x 2)     Status: None   Collection Time: 04/16/15  3:14 PM  Result Value Ref Range Status   Specimen Description BLOOD LEFT ARM  Final   Special Requests BOTTLES DRAWN AEROBIC AND ANAEROBIC  Final   Culture   Final    NO GROWTH 5 DAYS Performed at [facility]    Report Status 04/23/2015 FINAL  Final  Urine culture     Status: None   Collection Time: 04/16/15  6:02 PM  Result Value Ref Range Status   Specimen Description URINE, RANDOM  Final   Special Requests NONE  Final   Colony Count NO GROWTH Performed at [facility]   Final   Culture NO GROWTH Performed at [facility]   Final   Report Status 04/18/2015 FINAL  Final  Culture,  blood (routine x 2)     Status: None   Collection Time: 04/16/15  6:15 PM  Result Value Ref Range Status   Specimen Description BLOOD RIGHT ARM  Final   Special Requests BOTTLES DRAWN AEROBIC AND ANAEROBIC 5CC  Final   Culture   Final    NO GROWTH 5 DAYS Performed at [facility]    Report Status 04/23/2015 FINAL  Final  Culture, blood (routine x 2) Call MD if unable to obtain prior to antibiotics being given     Status: None   Collection Time: 04/16/15  9:10 PM  Result Value Ref Range Status   Specimen Description BLOOD RIGHT HAND  Final   Special Requests   Final    BOTTLES DRAWN AEROBIC AND ANAEROBIC 2CC  BLUE 1CC RED   Culture   Final    NO GROWTH 5 DAYS Note: Culture results may be compromised due to an inadequate volume of blood received in culture bottles. Performed at [facility]    Report Status 04/23/2015 FINAL  Final  Culture, blood (routine x 2) Call MD if unable to obtain prior to antibiotics being given     Status: None   Collection Time: 04/16/15  9:20 PM  Result Value Ref Range Status   Specimen Description BLOOD LEFT HAND  Final   Special Requests   Final    BOTTLES DRAWN AEROBIC AND ANAEROBIC 3CC BLUE 2CC RED   Culture   Final    NO GROWTH 5 DAYS Note: Culture results may be compromised due to an inadequate volume of blood received in culture bottles. Performed at [facility]    Report Status 04/23/2015 FINAL  Final  Respiratory virus antigens panel     Status: None   Collection Time: 04/16/15  9:47 PM  Result Value Ref Range Status   Source - RVPAN NASAL SWAB  Corrected   Respiratory Syncytial Virus A Negative Negative Final   Respiratory Syncytial Virus B Negative Negative Final   Influenza A Negative Negative Final   Influenza B Negative Negative Final   Parainfluenza 1 Negative Negative Final   Parainfluenza 2 Negative Negative Final   Parainfluenza 3 Negative Negative Final   Metapneumovirus Negative Negative Final   Rhinovirus Negative Negative Final   Adenovirus Negative Negative Final    Comment: (NOTE) Performed At: [facility] [address] [place], [place] [id number] Mila Homer MD WU:[id number]   Bordetella pertussis PCR     Status: None   Collection Time: 04/16/15  9:52 PM  Result Value Ref Range Status   B pertussis, DNA Negative Negative Final   B parapertussis, DNA Negative Negative Final    Comment: (NOTE) This test was developed and its performance characteristics determined by [employer] [facility]. It has not been cleared or approved by the U.S. Food and Drug Administration. The FDA has determined that  such clearance or approval is not necessary. This test is used for clinical purposes. It should not be regarded as investigational or research. Performed At: [facility] [address] [place], [place] [id number] Mila Homer MD VH:[id number]   Surgical pcr screen     Status: Abnormal   Collection Time: 04/19/15 10:30 PM  Result Value Ref Range Status   MRSA, PCR NEGATIVE NEGATIVE Final   Staphylococcus aureus POSITIVE (A) NEGATIVE Final    Comment:        The Xpert SA Assay (FDA approved for NASAL specimens in patients over 39 years of age),  is one component of a comprehensive surveillance program.  Test performance has been validated by [facility] for patients greater than or equal to 47 year old. It is not intended to diagnose infection nor to guide or monitor treatment.     Medical History: Past Medical History  Diagnosis Date  . Chest pain 2010  . Hyperlipidemia   . Coronary artery disease     SPONTANEOUS DISSECTION AND PLAQUE RUPTURE OF HIS LEFT ANTERIOR DESCENDING ARTERY  . MI (myocardial infarction)   . HIV infection   . Empyema lung   . Emphysema lung     Medications:  Scheduled:  . acetaminophen  1,000 mg Oral 4 times per day   Or  . acetaminophen (TYLENOL) oral liquid 160 mg/5 mL  1,000 mg Oral 4 times per day  . bisacodyl  10 mg Oral Daily  . dolutegravir  50 mg Oral Daily  . emtricitabine-tenofovir  1 tablet Oral Daily  . fentaNYL   Intravenous 6 times per day  . HYDROmorphone      . HYDROmorphone      . ketorolac  15 mg Intravenous 4 times per day  . senna-docusate  1 tablet Oral QHS   Assessment: 34 yo M with hx HIV and known chronic lung mass went to OR today for RUL lobectomy.  Pt has been on a variety of antibiotics pre-op.  Abx changed to Vancomycin and Zosyn post-op.  All cx NGTD.   Vanc 4/19 x 1 Zosyn 4/19 x 1 Ceftriaxone 4/19 >>4/20 Cefixime 4/20 x1 Unasyn 4/20 >>4/22 LVQ 4/22 >> 4/25 Zinacef pre-op 4/26 x 1 Vanc 4/26  >> Zosyn 4/26 >>  Goal of Therapy:  Vancomycin trough level 15-20 mcg/ml  Plan:  Vancomycin 750 mg IV q8h Zosyn 3.375 gm IV q8h (4 hour infusion). Follow-up clinical progress, culture data, renal function, and length of therapy. Consider Vancomycin trough level at steady state.  [employer], Pharm.D., BCPS Clinical Pharmacist Pager [phone] 04/23/2015 1:50 PM

## 2015-04-23 NOTE — Progress Notes (Signed)
TCTS BRIEF SICU PROGRESS NOTE  Day of Surgery  S/P Procedure(s) (LRB): VIDEO ASSISTED THORACOSCOPY (VATS)/RIGHT upper  and right middle LOBECTOMY (Right)   Complaining of pain.  Receiving fentanyl PCA NSR w/ stable BP Breathing comfortably on 2 L/min via [place] Low volume thin serosanguinous chest tube output UOP adequate  Plan: Continue routine early postop  Purcell Nails 04/23/2015 7:31 PM

## 2015-04-23 NOTE — Transfer of Care (Signed)
Immediate Anesthesia Transfer of Care Note  Patient: Rodney Walker  Procedure(s) Performed: Procedure(s): VIDEO ASSISTED THORACOSCOPY (VATS)/RIGHT upper  and right middle LOBECTOMY (Right)  Patient Location: PACU  Anesthesia Type:General  Level of Consciousness: awake, alert , oriented and patient cooperative  Airway & Oxygen Therapy: Patient Spontanous Breathing and Patient connected to nasal cannula oxygen  Post-op Assessment: Report given to RN, Post -op Vital signs reviewed and stable and Patient moving all extremities  Post vital signs: Reviewed and stable  Complications: No apparent anesthesia complications

## 2015-04-24 ENCOUNTER — Encounter (HOSPITAL_COMMUNITY): Payer: Self-pay | Admitting: Cardiothoracic Surgery

## 2015-04-24 ENCOUNTER — Inpatient Hospital Stay (HOSPITAL_COMMUNITY): Payer: Self-pay

## 2015-04-24 DIAGNOSIS — B9561 Methicillin susceptible Staphylococcus aureus infection as the cause of diseases classified elsewhere: Secondary | ICD-10-CM

## 2015-04-24 DIAGNOSIS — Z9889 Other specified postprocedural states: Secondary | ICD-10-CM

## 2015-04-24 LAB — POCT I-STAT 4, (NA,K, GLUC, HGB,HCT)
GLUCOSE: 147 mg/dL — AB (ref 70–99)
Glucose, Bld: 141 mg/dL — ABNORMAL HIGH (ref 70–99)
HEMATOCRIT: 44 % (ref 39.0–52.0)
HEMATOCRIT: 55 % — AB (ref 39.0–52.0)
Hemoglobin: 15 g/dL (ref 13.0–17.0)
Hemoglobin: 18.7 g/dL — ABNORMAL HIGH (ref 13.0–17.0)
Potassium: 3.6 mmol/L (ref 3.5–5.1)
Potassium: 3.6 mmol/L (ref 3.5–5.1)
SODIUM: 135 mmol/L (ref 135–145)
Sodium: 135 mmol/L (ref 135–145)

## 2015-04-24 LAB — BASIC METABOLIC PANEL
Anion gap: 7 (ref 5–15)
BUN: 5 mg/dL — ABNORMAL LOW (ref 6–23)
CO2: 25 mmol/L (ref 19–32)
CREATININE: 0.81 mg/dL (ref 0.50–1.35)
Calcium: 7.7 mg/dL — ABNORMAL LOW (ref 8.4–10.5)
Chloride: 99 mmol/L (ref 96–112)
GFR calc non Af Amer: >90 mL/min (ref >90)
Glucose, Bld: 128 mg/dL — ABNORMAL HIGH (ref 70–99)
Potassium: 3.7 mmol/L (ref 3.5–5.1)
Sodium: 131 mmol/L — ABNORMAL LOW (ref 135–145)

## 2015-04-24 LAB — POCT I-STAT 3, ART BLOOD GAS (G3+)
Acid-Base Excess: 3 mmol/L — ABNORMAL HIGH (ref 0.0–2.0)
Bicarbonate: 27.6 mEq/L — ABNORMAL HIGH (ref 20.0–24.0)
O2 SAT: 96 %
PH ART: 7.452 — AB (ref 7.350–7.450)
TCO2: 29 mmol/L (ref 0–100)
pCO2 arterial: 39.7 mmHg (ref 35.0–45.0)
pO2, Arterial: 79 mmHg — ABNORMAL LOW (ref 80.0–100.0)

## 2015-04-24 LAB — CBC
HEMATOCRIT: 27.9 % — AB (ref 39.0–52.0)
Hemoglobin: 10.1 g/dL — ABNORMAL LOW (ref 13.0–17.0)
MCH: 31.3 pg (ref 26.0–34.0)
MCHC: 36.2 g/dL — AB (ref 30.0–36.0)
MCV: 86.4 fL (ref 78.0–100.0)
Platelets: 245 10*3/uL (ref 150–400)
RBC: 3.23 MIL/uL — ABNORMAL LOW (ref 4.22–5.81)
RDW: 13.4 % (ref 11.5–15.5)
WBC: 11.1 10*3/uL — ABNORMAL HIGH (ref 4.0–10.5)

## 2015-04-24 MED ORDER — POTASSIUM CHLORIDE 10 MEQ/50ML IV SOLN
10.0000 meq | INTRAVENOUS | Status: AC
  Administered 2015-04-24 (×3): 10 meq via INTRAVENOUS
  Filled 2015-04-24 (×2): qty 50

## 2015-04-24 MED ORDER — ENSURE ENLIVE PO LIQD
237.0000 mL | Freq: Two times a day (BID) | ORAL | Status: DC
  Administered 2015-04-24 – 2015-04-27 (×5): 237 mL via ORAL

## 2015-04-24 MED ORDER — POTASSIUM CHLORIDE 10 MEQ/50ML IV SOLN
10.0000 meq | INTRAVENOUS | Status: AC
  Administered 2015-04-24 (×2): 10 meq via INTRAVENOUS
  Filled 2015-04-24 (×2): qty 50

## 2015-04-24 MED ORDER — POTASSIUM CHLORIDE 10 MEQ/50ML IV SOLN
10.0000 meq | INTRAVENOUS | Status: AC
  Administered 2015-04-24 (×3): 10 meq via INTRAVENOUS
  Filled 2015-04-24 (×3): qty 50

## 2015-04-24 MED ORDER — FUROSEMIDE 10 MG/ML IJ SOLN
20.0000 mg | Freq: Every day | INTRAMUSCULAR | Status: DC
  Administered 2015-04-24 – 2015-04-26 (×3): 20 mg via INTRAVENOUS
  Filled 2015-04-24 (×3): qty 2

## 2015-04-24 NOTE — Progress Notes (Signed)
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  Patient s/p procedure 4/26: RIGHT THORACOTOMY RIGHT UPPER AND MIDDLE LOBECTOMIES  Pt sleeping upon RD visit.  Currently on a Regular diet.  Per RN, eating well.  Having some food brought in from outside.  Was receiving Ensure Enlive supplements prior to surgery.  Increased nutrient needs given HIV.  RD to re-order.  Height: Ht Readings from Last 1 Encounters:  04/16/15  (1.626 m)    Weight: Wt Readings from Last 1 Encounters:  04/24/15 134 lb 7.7 oz (61 kg)    BMI:  Body mass index is 23.07 kg/(m^2).  Estimated Nutritional Needs: Kcal: 2100-2300 Protein: 110-120 gm Fluid: 2.1-2.3 L  Skin: Intact  Diet Order: Regular   Intake/Output Summary (Last 24 hours) at 04/24/15 1211 Last data filed at 04/24/15 0700  Gross per 24 hour  Intake   2957 ml  Output   2545 ml  Net    412 ml    Labs:   Recent Labs Lab 04/18/15 0030 04/22/15 1951 04/23/15 1033 04/23/15 1043 04/24/15 0415  NA 134* 135 135 135 131*  K 3.5 3.5 3.6 3.6 3.7  CL 101 98  --   --  99  CO2 22 28  --   --  25  BUN 9 9  --   --  5*  CREATININE 1.15 0.95  --    --  0.81  CALCIUM 8.1* 8.3*  --   --  7.7*  GLUCOSE 118* 106* 141* 147* 128*    Scheduled Meds: . acetaminophen  1,000 mg Oral 4 times per day   Or  . acetaminophen (TYLENOL) oral liquid 160 mg/5 mL  1,000 mg Oral 4 times per day  . antiseptic oral rinse  7 mL Mouth Rinse BID  . bisacodyl  10 mg Oral Daily  . Chlorhexidine Gluconate Cloth  6 each Topical Once  . dolutegravir  50 mg Oral Daily  . emtricitabine-tenofovir  1 tablet Oral Daily  . fentaNYL   Intravenous 6 times per day  . furosemide  20 mg Intravenous Daily  . ketorolac  15 mg Intravenous 4 times per day  . mupirocin ointment   Nasal BID  . piperacillin-tazobactam (ZOSYN)  IV  3.375 g Intravenous 3 times per day  . senna-docusate  1 tablet Oral QHS  . sodium chloride  10 mL Intravenous Q12H  . vancomycin  750 mg Intravenous Q8H    Continuous Infusions: . bupivacaine ON-Q pain pump    . dextrose 5 % and 0.45 % NaCl with KCl 20 mEq/L 30 mL (04/24/15 0920)    Past Medical History  Diagnosis Date  . Chest pain 2010  . Hyperlipidemia   . Coronary artery disease  SPONTANEOUS DISSECTION AND PLAQUE RUPTURE OF HIS LEFT ANTERIOR DESCENDING ARTERY  . MI (myocardial infarction)   . HIV infection   . Empyema lung   . Emphysema lung     Past Surgical History  Procedure Laterality Date  . Cardiac catheterization  06/24/2009    EF 60%  . Cardiac catheterization  06/17/2009    EF 45%. ANTERIOR HYPOKINESIS  . US echocardiography  12/09/2009    EF 55-60%  . Video bronchoscopy Bilateral 09/05/2014    Procedure: VIDEO BRONCHOSCOPY WITH FLUORO;  Surgeon: Merwyn Katos, MD;  Location: [facility] ENDOSCOPY;  Service: Cardiopulmonary;  Laterality: Bilateral;  . Video bronchoscopy Bilateral 04/10/2015    Procedure: VIDEO BRONCHOSCOPY WITH FLUORO;  Surgeon: Lupita Leash, MD;  Location: [facility] ENDOSCOPY;  Service: Cardiopulmonary;  Laterality: Bilateral;  . Video assisted thoracoscopy (vats)/ lobectomy Right 04/23/2015    Procedure:  VIDEO ASSISTED THORACOSCOPY (VATS)/RIGHT upper  and right middle LOBECTOMY;  Surgeon: Kerin Perna, MD;  Location: [facility] OR;  Service: Thoracic;  Laterality: Right;    Maureen Chatters, RD, LDN Pager #: [phone] After-Hours Pager #: [phone]

## 2015-04-24 NOTE — Progress Notes (Signed)
[facility]  Date of Admission:  04/16/2015  Antibiotics: Vancomycin and zosyn started post op Previously on levaquin Augmentin prior to that  Subjective: Feels good, some soreness  Objective: Temp:  [98.5 F (36.9 C)-100.6 F (38.1 C)] 98.5 F (36.9 C) (04/27 0708) Pulse Rate:  [56-109] 72 (04/27 1300) Resp:  [12-44] 27 (04/27 1300) BP: (96-135)/(52-82) 122/80 mmHg (04/27 1300) SpO2:  [92 %-100 %] 98 % (04/27 1300) Weight:  [134 lb 7.7 oz (61 kg)] 134 lb 7.7 oz (61 kg) (04/27 0600)  General: awake, alert, nad Skin: no rashes Lungs: CTAB Cor: RRR Abdomen: soft, nt Ext: no edema Chest :+ tube  Lab Results Lab Results  Component Value Date   WBC 11.1* 04/24/2015   HGB 10.1* 04/24/2015   HCT 27.9* 04/24/2015   MCV 86.4 04/24/2015   PLT 245 04/24/2015    Lab Results  Component Value Date   CREATININE 0.81 04/24/2015   BUN 5* 04/24/2015   NA 131* 04/24/2015   K 3.7 04/24/2015   CL 99 04/24/2015   CO2 25 04/24/2015    Lab Results  Component Value Date   ALT 73* 04/22/2015   AST 52* 04/22/2015   ALKPHOS 94 04/22/2015   BILITOT 0.4 04/22/2015      Microbiology: Recent Results (from the past 240 hour(s))  Culture, blood (routine x 2)     Status: None   Collection Time: 04/16/15  3:14 PM  Result Value Ref Range Status   Specimen Description BLOOD LEFT ARM  Final   Special Requests BOTTLES DRAWN AEROBIC AND ANAEROBIC  Final   Culture   Final    NO GROWTH 5 DAYS Performed at [facility]    Report Status 04/23/2015 FINAL  Final  Urine culture     Status: None   Collection Time: 04/16/15  6:02 PM  Result Value Ref Range Status   Specimen Description URINE, RANDOM  Final   Special Requests NONE  Final   Colony Count NO GROWTH Performed at [facility]   Final   Culture NO GROWTH Performed at [facility]   Final   Report Status 04/18/2015 FINAL  Final  Culture, blood (routine x 2)     Status:  None   Collection Time: 04/16/15  6:15 PM  Result Value Ref Range Status   Specimen Description BLOOD RIGHT ARM  Final   Special Requests BOTTLES DRAWN AEROBIC AND ANAEROBIC 5CC  Final   Culture   Final    NO GROWTH 5 DAYS Performed at [facility]    Report Status 04/23/2015 FINAL  Final  Culture, blood (routine x 2) Call MD if unable to obtain prior to antibiotics being given     Status: None   Collection Time: 04/16/15  9:10 PM  Result Value Ref Range Status   Specimen Description BLOOD RIGHT HAND  Final   Special Requests   Final    BOTTLES DRAWN AEROBIC AND ANAEROBIC 2CC BLUE 1CC RED   Culture   Final    NO GROWTH 5 DAYS Note: Culture results may be compromised due to an inadequate volume of blood received in culture bottles. Performed at [facility]    Report Status 04/23/2015 FINAL  Final  Culture, blood (routine x 2) Call MD if unable to obtain prior to antibiotics being given     Status: None   Collection Time: 04/16/15  9:20 PM  Result Value Ref Range Status  Specimen Description BLOOD LEFT HAND  Final   Special Requests   Final    BOTTLES DRAWN AEROBIC AND ANAEROBIC 3CC BLUE 2CC RED   Culture   Final    NO GROWTH 5 DAYS Note: Culture results may be compromised due to an inadequate volume of blood received in culture bottles. Performed at [facility]    Report Status 04/23/2015 FINAL  Final  Respiratory virus antigens panel     Status: None   Collection Time: 04/16/15  9:47 PM  Result Value Ref Range Status   Source - RVPAN NASAL SWAB  Corrected   Respiratory Syncytial Virus A Negative Negative Final   Respiratory Syncytial Virus B Negative Negative Final   Influenza A Negative Negative Final   Influenza B Negative Negative Final   Parainfluenza 1 Negative Negative Final   Parainfluenza 2 Negative Negative Final   Parainfluenza 3 Negative Negative Final   Metapneumovirus Negative Negative Final   Rhinovirus Negative Negative Final    Adenovirus Negative Negative Final    Comment: (NOTE) Performed At: [facility] [address] [place], [place] [id number] Mila Homer MD WU:[id number]   Bordetella pertussis PCR     Status: None   Collection Time: 04/16/15  9:52 PM  Result Value Ref Range Status   B pertussis, DNA Negative Negative Final   B parapertussis, DNA Negative Negative Final    Comment: (NOTE) This test was developed and its performance characteristics determined by [employer]. It has not been cleared or approved by the U.S. Food and Drug Administration. The FDA has determined that such clearance or approval is not necessary. This test is used for clinical purposes. It should not be regarded as investigational or research. Performed At: [facility] [address] [place], [place] [id number] Mila Homer MD VH:[id number]   Surgical pcr screen     Status: Abnormal   Collection Time: 04/19/15 10:30 PM  Result Value Ref Range Status   MRSA, PCR NEGATIVE NEGATIVE Final   Staphylococcus aureus POSITIVE (A) NEGATIVE Final    Comment:        The Xpert SA Assay (FDA approved for NASAL specimens in patients over 21 years of age), is one component of a comprehensive surveillance program.  Test performance has been validated by [facility] for patients greater than or equal to 68 year old. It is not intended to diagnose infection nor to guide or monitor treatment.   Anaerobic culture     Status: None (Preliminary result)   Collection Time: 04/23/15  9:08 AM  Result Value Ref Range Status   Specimen Description FLUID PLEURAL RIGHT  Final   Special Requests PT ON ZINACEF AND LEVAQUIN SPEC A  Final   Gram Stain   Final    MODERATE WBC PRESENT,BOTH PMN AND MONONUCLEAR NO ORGANISMS SEEN Performed at [facility]    Culture   Final    NO ANAEROBES ISOLATED; CULTURE IN PROGRESS FOR 5 DAYS Performed at [facility]    Report Status PENDING  Incomplete    Fungus Culture with Smear     Status: None (Preliminary result)   Collection Time: 04/23/15  9:08 AM  Result Value Ref Range Status   Specimen Description FLUID PLEURAL RIGHT  Final   Special Requests PT ON ZINACEF AND LEVAQUIN SPEC A  Final   Fungal Smear   Final    NO YEAST OR FUNGAL ELEMENTS SEEN Performed at [facility]  Final    CULTURE IN PROGRESS FOR FOUR WEEKS Performed at [facility]    Report Status PENDING  Incomplete  Body fluid culture     Status: None (Preliminary result)   Collection Time: 04/23/15  9:08 AM  Result Value Ref Range Status   Specimen Description FLUID PLEURAL RIGHT  Final   Special Requests PT ON ZINACEF/LEVAQUIN SPEC A  Final   Gram Stain   Final    MODERATE WBC PRESENT,BOTH PMN AND MONONUCLEAR NO ORGANISMS SEEN Performed at [facility]    Culture NO GROWTH Performed at [facility]   Final   Report Status PENDING  Incomplete  AFB culture with smear     Status: None (Preliminary result)   Collection Time: 04/23/15  9:08 AM  Result Value Ref Range Status   Specimen Description FLUID PLEURAL RIGHT  Final   Special Requests PT ON ZINACEF/LEVAQUIN SPEC A  Final   Acid Fast Smear   Final    NO ACID FAST BACILLI SEEN Performed at [facility]    Culture   Final    CULTURE WILL BE EXAMINED FOR 6 WEEKS BEFORE ISSUING A FINAL REPORT Performed at [facility]    Report Status PENDING  Incomplete  Fungus Culture with Smear     Status: None (Preliminary result)   Collection Time: 04/23/15  9:12 AM  Result Value Ref Range Status   Specimen Description FLUID PLEURAL RIGHT  Final   Special Requests PT ON ZINACEF/LEVAQUIN SPEC B  Final   Fungal Smear   Final    NO YEAST OR FUNGAL ELEMENTS SEEN Performed at [facility]    Culture   Final    CULTURE IN PROGRESS FOR FOUR WEEKS Performed at [facility]    Report Status PENDING  Incomplete  Body fluid culture      Status: None (Preliminary result)   Collection Time: 04/23/15  9:12 AM  Result Value Ref Range Status   Specimen Description FLUID PLEURAL RIGHT  Final   Special Requests PT ON ZINACEF/LEVAQUIN SPEC B  Final   Gram Stain   Final    MODERATE WBC PRESENT,BOTH PMN AND MONONUCLEAR NO ORGANISMS SEEN Performed at [facility]    Culture NO GROWTH Performed at [facility]   Final   Report Status PENDING  Incomplete  AFB culture with smear     Status: None (Preliminary result)   Collection Time: 04/23/15  9:12 AM  Result Value Ref Range Status   Specimen Description FLUID PLEURAL RIGHT  Final   Special Requests PT ON ZINCEF/LEVAQUIN SPEC B  Final   Acid Fast Smear   Final    NO ACID FAST BACILLI SEEN Performed at [facility]    Culture   Final    CULTURE WILL BE EXAMINED FOR 6 WEEKS BEFORE ISSUING A FINAL REPORT Performed at [facility]    Report Status PENDING  Incomplete    Studies/Results: Dg Chest Port 1 View  04/24/2015   CLINICAL DATA:  Assess chest tube positioning, status post surgical resection of a cavitary mass in the right upper lobe  EXAM: PORTABLE CHEST - 1 VIEW  COMPARISON:  Portable chest of April 23, 2015  FINDINGS: The left lung is well-expanded and clear. On the right the 3 chest tubes are unchanged. There is no pneumothorax or significant pleural effusion. There is a small amount of soft tissue gas in the right axilla. There is increased  alveolar opacity in the right mid and upper lung. The cardiac silhouette is top-normal in size. The pulmonary vascularity is normal. The trachea is midline. The right internal jugular venous catheter tip projects over the cavoatrial junction.  IMPRESSION: Increased alveolar opacity in the right lung consistent with pneumonia or other alveolar filling process. There is no pleural effusion or pneumothorax. The chest tubes are in stable position.   Electronically Signed   By: David  [place]az[place] M.D.   On:  04/24/2015 07:42   Dg Chest Port 1 View  04/23/2015   CLINICAL DATA:  34 year old male post VATS.  Initial encounter.  EXAM: PORTABLE CHEST - 1 VIEW  COMPARISON:  Preoperative 04/19/2015 exam.  FINDINGS: Surgical treatment of cavitary lesion right upper lobe with postoperative changes at this level noted including mild prominence of the right paratracheal region which may indicate simple postoperative changes although postoperative hemorrhage not excluded. Attention to this on follow up.  Two and possibly 3 right-sided chest tube are in place with subcutaneous emphysema right chest wall without evidence gross pneumothorax.  Right central line tip distal superior vena cava/ cavoatrial junction level. Heart size top-normal.  Mild pulmonary vascular prominence most notable perihilar region.  Right fifth rib fracture.  IMPRESSION: Surgical treatment of cavitary lesion right upper lobe with postoperative changes at this level noted including mild prominence of the right paratracheal region which may indicate simple postoperative changes although postoperative hemorrhage not excluded. Attention to this on follow up.  Two and possibly 3 right-sided chest tube are in place with subcutaneous emphysema right chest wall without evidence gross pneumothorax.  Right central line tip distal superior vena cava/ cavoatrial junction level.  Mild pulmonary vascular prominence most notable perihilar region.   Electronically Signed   By: Lacy Duverney M.D.   On: 04/23/2015 12:07    Assessment/Plan:  1) pulmonary abscess s/p drainage/VATS - on broad spectrum antibiotics, will narrow soon if no positive cultures prior to guide therapy.  He was responding to levaquin with improved WBC so will change to that if no other cultures grow.    Staci Righter, MD [facility] [email or web address] [phone] pager   [phone] cell 04/24/2015, 2:28 PM

## 2015-04-24 NOTE — Progress Notes (Signed)
Name: Rodney Walker MRN: [id number] DOB: 1981-08-21    ADMISSION DATE:  04/16/2015 CONSULTATION DATE:  04/17/2015  REFERRING MD :  Dr. 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  PCCM consulted for evaluation of mass.    SIGNIFICANT EVENTS  Fall 2015  >> Dx with RUL mass like opacity  09/05/14  FOB >> per Dr. Sung Amabile, fungating mass RUL, bx c/w inflammation, granulomatous inflammation on brushing 04/10/15  FOB >> per Dr. Kendrick Fries, benign lung tissue w/o evidence of malignancy 04/15/15  Seen in ER for fevers, cough, SOB, Rx'd with doxycycline x 5 days from Dr. Kendrick Fries 04/16/15  Returned with fever, admitted with PNA 4/26 R VATS, RULobectomy, RMLobectomy   STUDIES:  3/31  CT Chest >> mass like consolidation in suprahilar RUL with surrounding nodularity 4/19  CT Chest >> increased mass-like opacity in RUL, post-obstructive pneumonitis, severe emphysema R>L, scattered bilateral pulmonary nodules  SUBJECTIVE:  Stable post op R upper and R middle lobectomy 4/26.  On 1L [place].  Post-op pain controlled with Fentanyl PCA   VITAL SIGNS: Temp:  [98.5 F (36.9 C)-100.6 F (38.1 C)] 98.5 F (36.9 C) (04/27 0708) Pulse Rate:  [56-109] 68 (04/27 1100) Resp:  [12-44] 27 (04/27 1100) BP: (96-139)/(37-82) 103/59 mmHg (04/27 0800) SpO2:  [92 %-100 %] 100 % (04/27 1100) Arterial Line BP: (138-171)/(66-72) 141/67 mmHg (04/26 1245) Weight:  [134 lb 7.7 oz (61 kg)] 134 lb 7.7 oz (61 kg) (04/27 0600)  PHYSICAL EXAMINATION: General: pleasant, complains of post op pain Neuro: normal strength HEENT: no oral lesions Cardiovascular: regular Lungs: no wheeze, decreased BS RUL, incision c/d  Abdomen: soft, non tender Musculoskeletal: no edema Skin: no rashes    Recent Labs Lab  04/18/15 0030 04/22/15 1951 04/23/15 1033 04/23/15 1043 04/24/15 0415  NA 134* 135 135 135 131*  K 3.5 3.5 3.6 3.6 3.7  CL 101 98  --   --  99  CO2 22 28  --   --  25  BUN 9 9  --   --  5*  CREATININE 1.15 0.95  --   --  0.81  GLUCOSE 118* 106* 141* 147* 128*    Recent Labs Lab 04/19/15 0521 04/22/15 1951 04/23/15 1033 04/23/15 1043 04/24/15 0415  HGB 11.6* 10.8* 18.7* 15.0 10.1*  HCT 31.5* 29.3* 55.0* 44.0 27.9*  WBC 12.8* 7.7  --   --  11.1*  PLT 161 221  --   --  245   Dg Chest Port 1 View  04/24/2015   CLINICAL DATA:  Assess chest tube positioning, status post surgical resection of a cavitary mass in the right upper lobe  EXAM: PORTABLE CHEST - 1 VIEW  COMPARISON:  Portable chest of April 23, 2015  FINDINGS: The left lung is well-expanded and clear. On the right the 3 chest tubes are unchanged. There is no pneumothorax or significant pleural effusion. There is a small amount of soft tissue gas in the right axilla. There is increased alveolar opacity in the right mid and upper lung. The cardiac silhouette is top-normal in size. The pulmonary vascularity is normal. The trachea is midline. The right internal jugular venous catheter tip projects over the cavoatrial junction.  IMPRESSION: Increased alveolar opacity in the right lung consistent  with pneumonia or other alveolar filling process. There is no pleural effusion or pneumothorax. The chest tubes are in stable position.   Electronically Signed   By: David  [place] M.D.   On: 04/24/2015 07:42   Dg Chest Port 1 View  04/23/2015   CLINICAL DATA:  34 year old male post VATS.  Initial encounter.  EXAM: PORTABLE CHEST - 1 VIEW  COMPARISON:  Preoperative 04/19/2015 exam.  FINDINGS: Surgical treatment of cavitary lesion right upper lobe with postoperative changes at this level noted including mild prominence of the right paratracheal region which may indicate simple postoperative changes although postoperative hemorrhage not excluded.  Attention to this on follow up.  Two and possibly 3 right-sided chest tube are in place with subcutaneous emphysema right chest wall without evidence gross pneumothorax.  Right central line tip distal superior vena cava/ cavoatrial junction level. Heart size top-normal.  Mild pulmonary vascular prominence most notable perihilar region.  Right fifth rib fracture.  IMPRESSION: Surgical treatment of cavitary lesion right upper lobe with postoperative changes at this level noted including mild prominence of the right paratracheal region which may indicate simple postoperative changes although postoperative hemorrhage not excluded. Attention to this on follow up.  Two and possibly 3 right-sided chest tube are in place with subcutaneous emphysema right chest wall without evidence gross pneumothorax.  Right central line tip distal superior vena cava/ cavoatrial junction level.  Mild pulmonary vascular prominence most notable perihilar region.   Electronically Signed   By: Lacy Duverney M.D.   On: 04/23/2015 12:07    ASSESSMENT / PLAN:  BAL fungal 4/13 >> BAL AFB 4/13 >> Bordetella swab 4/19 >>neg Adenovirus Ab 4/19 >> neg Legionella urine Ag 4/19 >>  Blood 4/19 >> Pneumococcal urine Ag 4/19 >> negative RSV panel 4/19 >> negative Influenza PCR 4/19 >> negative Legionella total Ab 4/19 >> negative  RUL lung abscess - worsening despite long term anx and FOB.  Ongoing fevers, SIRS.  Now with RML involvement.  S/p RUL and RML lobectomy 4/26.   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   PLAN -  Outpt pulmonary f/u with PFT's  Post-op care per CVTS  Continue abx per ID recs  Supplemental O2 as needed  F/u cultures  Smoking cessation - cigarettes and THC   Pt improving, doing well post op lobectomy.  Post-op care per CVTS.   Will f/u as outpt.   PCCM signing off, please call back if needed.   Dirk Dress, NP 04/24/2015  11:32 AM Pager: [phone] or [phone]  I reviewed CXR  myself, worsening infiltrate on the right, chest tube in stable position, no PTX.  34 year old male with HIV history, presenting with a pulmonary abscess.  Post op for bleb resection and abscess resection.  Extubated and doing well.  RUL Abscess: has been worsening inspite of treatment.  S/p resection.  - Continue abx.  - F/U on cultures.  - Will narrow when cultures are available for surgery.  PNA worsening on CXR.  - Vanc/zosyn.  - FU on cultures.  - Await culture results from surgery.  Bullous emphysema:   - S/p resection of the RUL one.  - Smoking cessation.  - Stop illicit drug use as that is making the process worse.  Tobacco abuse:  - Smoking cessation.  Hypoxemia: resolved, on RA.  Chest tube management per CVTS.  PCCM will sign off, please call back if needed.  Patient seen and examined, agree with above note.  I dictated the care and orders written for this patient under my direction.  Jenalyn Girdner G Harwood Nall, MD [phone] 

## 2015-04-24 NOTE — Progress Notes (Signed)
1 Day Post-Op Procedure(s) (LRB): VIDEO ASSISTED THORACOSCOPY (VATS)/RIGHT upper  and right middle LOBECTOMY (Right) Subjective: R bilobectomy for 6 cm abscess OOB to chair Min air leak  Objective: Vital signs in last 24 hours: Temp:  [98.5 F (36.9 C)-100.6 F (38.1 C)] 98.7 F (37.1 C) (04/27 1928) Pulse Rate:  [58-109] 79 (04/27 1800) Cardiac Rhythm:  [-] Normal sinus rhythm (04/27 1800) Resp:  [12-44] 25 (04/27 1800) BP: (96-135)/(52-81) 124/74 mmHg (04/27 1800) SpO2:  [92 %-100 %] 94 % (04/27 1800) Weight:  [134 lb 7.7 oz (61 kg)] 134 lb 7.7 oz (61 kg) (04/27 0600)  Hemodynamic parameters for last 24 hours:  stable  Intake/Output from previous day: 04/26 0701 - 04/27 0700 In: 4957 [P.O.:240; I.V.:4067; IV Piggyback:650] Out: 4170 [Urine:3240; Blood:500; Chest Tube:430] Intake/Output this shift:    Lungs with scattered rhonchi  Lab Results:  Recent Labs  04/22/15 1951  04/23/15 1043 04/24/15 0415  WBC 7.7  --   --  11.1*  HGB 10.8*  < > 15.0 10.1*  HCT 29.3*  < > 44.0 27.9*  PLT 221  --   --  245  < > = values in this interval not displayed. BMET:  Recent Labs  04/22/15 1951  04/23/15 1043 04/24/15 0415  NA 135  < > 135 131*  K 3.5  < > 3.6 3.7  CL 98  --   --  99  CO2 28  --   --  25  GLUCOSE 106*  < > 147* 128*  BUN 9  --   --  5*  CREATININE 0.95  --   --  0.81  CALCIUM 8.3*  --   --  7.7*  < > = values in this interval not displayed.  PT/INR:  Recent Labs  04/22/15 1951  LABPROT 16.9*  INR 1.36   ABG    Component Value Date/Time   PHART 7.452* 04/24/2015 0416   HCO3 27.6* 04/24/2015 0416   TCO2 29 04/24/2015 0416   O2SAT 96.0 04/24/2015 0416   CBG (last 3)  No results for input(s): GLUCAP in the last 72 hours.  Assessment/Plan: S/P Procedure(s) (LRB): VIDEO ASSISTED THORACOSCOPY (VATS)/RIGHT upper  and right middle LOBECTOMY (Right) Leave 3 chest tubes in place   LOS: 8 days    Kathlee Nations Trigt III 04/24/2015

## 2015-04-25 ENCOUNTER — Inpatient Hospital Stay (HOSPITAL_COMMUNITY): Payer: Self-pay

## 2015-04-25 LAB — COMPREHENSIVE METABOLIC PANEL
ALT: 44 U/L (ref 0–53)
AST: 42 U/L — ABNORMAL HIGH (ref 0–37)
Albumin: 1.6 g/dL — ABNORMAL LOW (ref 3.5–5.2)
Alkaline Phosphatase: 71 U/L (ref 39–117)
Anion gap: 6 (ref 5–15)
BUN: 8 mg/dL (ref 6–23)
CO2: 31 mmol/L (ref 19–32)
Calcium: 8 mg/dL — ABNORMAL LOW (ref 8.4–10.5)
Chloride: 97 mmol/L (ref 96–112)
Creatinine, Ser: 1.02 mg/dL (ref 0.50–1.35)
GFR calc Af Amer: >90 mL/min (ref >90)
GFR calc non Af Amer: >90 mL/min (ref >90)
Glucose, Bld: 118 mg/dL — ABNORMAL HIGH (ref 70–99)
Potassium: 4 mmol/L (ref 3.5–5.1)
Sodium: 134 mmol/L — ABNORMAL LOW (ref 135–145)
Total Bilirubin: 0.6 mg/dL (ref 0.3–1.2)
Total Protein: 5.2 g/dL — ABNORMAL LOW (ref 6.0–8.3)

## 2015-04-25 LAB — CBC
HCT: 25.2 % — ABNORMAL LOW (ref 39.0–52.0)
Hemoglobin: 9.1 g/dL — ABNORMAL LOW (ref 13.0–17.0)
MCH: 31.1 pg (ref 26.0–34.0)
MCHC: 36.1 g/dL — ABNORMAL HIGH (ref 30.0–36.0)
MCV: 86 fL (ref 78.0–100.0)
Platelets: 262 10*3/uL (ref 150–400)
RBC: 2.93 MIL/uL — ABNORMAL LOW (ref 4.22–5.81)
RDW: 13.3 % (ref 11.5–15.5)
WBC: 11.3 10*3/uL — ABNORMAL HIGH (ref 4.0–10.5)

## 2015-04-25 MED ORDER — POTASSIUM CHLORIDE 10 MEQ/50ML IV SOLN: 10.0000 meq | INTRAVENOUS | Status: DC | PRN

## 2015-04-25 MED ORDER — LEVOFLOXACIN IN D5W 500 MG/100ML IV SOLN
500.0000 mg | INTRAVENOUS | Status: DC
  Administered 2015-04-25: 500 mg via INTRAVENOUS
  Filled 2015-04-25 (×2): qty 100

## 2015-04-25 NOTE — Progress Notes (Signed)
[facility]  Date of Admission:  04/16/2015  Antibiotics: Vancomycin and zosyn started post op Previously on levaquin Augmentin prior to that  Subjective: Feels better  Objective: Temp:  [98.1 F (36.7 C)-99 F (37.2 C)] 98.2 F (36.8 C) (04/28 1100) Pulse Rate:  [67-106] 94 (04/28 1200) Resp:  [16-31] 26 (04/28 1200) BP: (102-129)/(60-79) 119/66 mmHg (04/28 1200) SpO2:  [86 %-99 %] 96 % (04/28 1200) Weight:  [134 lb 7.7 oz (61 kg)] 134 lb 7.7 oz (61 kg) (04/28 0500)  General: awake, alert, nad Skin: no rashes Lungs: CTAB Cor: RRR Abdomen: soft, nt Ext: no edema Chest :+ tubes  Lab Results Lab Results  Component Value Date   WBC 11.3* 04/25/2015   HGB 9.1* 04/25/2015   HCT 25.2* 04/25/2015   MCV 86.0 04/25/2015   PLT 262 04/25/2015    Lab Results  Component Value Date   CREATININE 1.02 04/25/2015   BUN 8 04/25/2015   NA 134* 04/25/2015   K 4.0 04/25/2015   CL 97 04/25/2015   CO2 31 04/25/2015    Lab Results  Component Value Date   ALT 44 04/25/2015   AST 42* 04/25/2015   ALKPHOS 71 04/25/2015   BILITOT 0.6 04/25/2015      Microbiology: Recent Results (from the past 240 hour(s))  Culture, blood (routine x 2)     Status: None   Collection Time: 04/16/15  3:14 PM  Result Value Ref Range Status   Specimen Description BLOOD LEFT ARM  Final   Special Requests BOTTLES DRAWN AEROBIC AND ANAEROBIC  Final   Culture   Final    NO GROWTH 5 DAYS Performed at [facility]    Report Status 04/23/2015 FINAL  Final  Urine culture     Status: None   Collection Time: 04/16/15  6:02 PM  Result Value Ref Range Status   Specimen Description URINE, RANDOM  Final   Special Requests NONE  Final   Colony Count NO GROWTH Performed at [facility]   Final   Culture NO GROWTH Performed at [facility]   Final   Report Status 04/18/2015 FINAL  Final  Culture, blood (routine x 2)     Status: None   Collection  Time: 04/16/15  6:15 PM  Result Value Ref Range Status   Specimen Description BLOOD RIGHT ARM  Final   Special Requests BOTTLES DRAWN AEROBIC AND ANAEROBIC 5CC  Final   Culture   Final    NO GROWTH 5 DAYS Performed at [facility]    Report Status 04/23/2015 FINAL  Final  Culture, blood (routine x 2) Call MD if unable to obtain prior to antibiotics being given     Status: None   Collection Time: 04/16/15  9:10 PM  Result Value Ref Range Status   Specimen Description BLOOD RIGHT HAND  Final   Special Requests   Final    BOTTLES DRAWN AEROBIC AND ANAEROBIC 2CC BLUE 1CC RED   Culture   Final    NO GROWTH 5 DAYS Note: Culture results may be compromised due to an inadequate volume of blood received in culture bottles. Performed at [facility]    Report Status 04/23/2015 FINAL  Final  Culture, blood (routine x 2) Call MD if unable to obtain prior to antibiotics being given     Status: None   Collection Time: 04/16/15  9:20 PM  Result Value Ref Range Status   Specimen  Description BLOOD LEFT HAND  Final   Special Requests   Final    BOTTLES DRAWN AEROBIC AND ANAEROBIC 3CC BLUE 2CC RED   Culture   Final    NO GROWTH 5 DAYS Note: Culture results may be compromised due to an inadequate volume of blood received in culture bottles. Performed at [facility]    Report Status 04/23/2015 FINAL  Final  Respiratory virus antigens panel     Status: None   Collection Time: 04/16/15  9:47 PM  Result Value Ref Range Status   Source - RVPAN NASAL SWAB  Corrected   Respiratory Syncytial Virus A Negative Negative Final   Respiratory Syncytial Virus B Negative Negative Final   Influenza A Negative Negative Final   Influenza B Negative Negative Final   Parainfluenza 1 Negative Negative Final   Parainfluenza 2 Negative Negative Final   Parainfluenza 3 Negative Negative Final   Metapneumovirus Negative Negative Final   Rhinovirus Negative Negative Final   Adenovirus Negative  Negative Final    Comment: (NOTE) Performed At: [facility] [address] [place], [place] [id number] Mila Homer MD WU:[id number]   Bordetella pertussis PCR     Status: None   Collection Time: 04/16/15  9:52 PM  Result Value Ref Range Status   B pertussis, DNA Negative Negative Final   B parapertussis, DNA Negative Negative Final    Comment: (NOTE) This test was developed and its performance characteristics determined by [employer]. It has not been cleared or approved by the U.S. Food and Drug Administration. The FDA has determined that such clearance or approval is not necessary. This test is used for clinical purposes. It should not be regarded as investigational or research. Performed At: [facility] [address] [place], [place] [id number] Mila Homer MD VH:[id number]   Surgical pcr screen     Status: Abnormal   Collection Time: 04/19/15 10:30 PM  Result Value Ref Range Status   MRSA, PCR NEGATIVE NEGATIVE Final   Staphylococcus aureus POSITIVE (A) NEGATIVE Final    Comment:        The Xpert SA Assay (FDA approved for NASAL specimens in patients over 72 years of age), is one component of a comprehensive surveillance program.  Test performance has been validated by [facility] for patients greater than or equal to 75 year old. It is not intended to diagnose infection nor to guide or monitor treatment.   Anaerobic culture     Status: None (Preliminary result)   Collection Time: 04/23/15  9:08 AM  Result Value Ref Range Status   Specimen Description FLUID PLEURAL RIGHT  Final   Special Requests PT ON ZINACEF AND LEVAQUIN SPEC A  Final   Gram Stain   Final    MODERATE WBC PRESENT,BOTH PMN AND MONONUCLEAR NO ORGANISMS SEEN Performed at [facility]    Culture   Final    NO ANAEROBES ISOLATED; CULTURE IN PROGRESS FOR 5 DAYS Performed at [facility]    Report Status PENDING  Incomplete  Fungus Culture with  Smear     Status: None (Preliminary result)   Collection Time: 04/23/15  9:08 AM  Result Value Ref Range Status   Specimen Description FLUID PLEURAL RIGHT  Final   Special Requests PT ON ZINACEF AND LEVAQUIN SPEC A  Final   Fungal Smear   Final    NO YEAST OR FUNGAL ELEMENTS SEEN Performed at [facility]    Culture   Final  CULTURE IN PROGRESS FOR FOUR WEEKS Performed at [facility]    Report Status PENDING  Incomplete  Body fluid culture     Status: None (Preliminary result)   Collection Time: 04/23/15  9:08 AM  Result Value Ref Range Status   Specimen Description FLUID PLEURAL RIGHT  Final   Special Requests PT ON ZINACEF/LEVAQUIN SPEC A  Final   Gram Stain   Final    MODERATE WBC PRESENT,BOTH PMN AND MONONUCLEAR NO ORGANISMS SEEN Performed at [facility]    Culture NO GROWTH Performed at [facility]   Final   Report Status PENDING  Incomplete  AFB culture with smear     Status: None (Preliminary result)   Collection Time: 04/23/15  9:08 AM  Result Value Ref Range Status   Specimen Description FLUID PLEURAL RIGHT  Final   Special Requests PT ON ZINACEF/LEVAQUIN SPEC A  Final   Acid Fast Smear   Final    NO ACID FAST BACILLI SEEN Performed at [facility]    Culture   Final    CULTURE WILL BE EXAMINED FOR 6 WEEKS BEFORE ISSUING A FINAL REPORT Performed at [facility]    Report Status PENDING  Incomplete  Fungus Culture with Smear     Status: None (Preliminary result)   Collection Time: 04/23/15  9:12 AM  Result Value Ref Range Status   Specimen Description FLUID PLEURAL RIGHT  Final   Special Requests PT ON ZINACEF/LEVAQUIN SPEC B  Final   Fungal Smear   Final    NO YEAST OR FUNGAL ELEMENTS SEEN Performed at [facility]    Culture   Final    CULTURE IN PROGRESS FOR FOUR WEEKS Performed at [facility]    Report Status PENDING  Incomplete  Body fluid culture     Status: None (Preliminary  result)   Collection Time: 04/23/15  9:12 AM  Result Value Ref Range Status   Specimen Description FLUID PLEURAL RIGHT  Final   Special Requests PT ON ZINACEF/LEVAQUIN SPEC B  Final   Gram Stain   Final    MODERATE WBC PRESENT,BOTH PMN AND MONONUCLEAR NO ORGANISMS SEEN Performed at [facility]    Culture NO GROWTH Performed at [facility]   Final   Report Status PENDING  Incomplete  AFB culture with smear     Status: None (Preliminary result)   Collection Time: 04/23/15  9:12 AM  Result Value Ref Range Status   Specimen Description FLUID PLEURAL RIGHT  Final   Special Requests PT ON ZINCEF/LEVAQUIN SPEC B  Final   Acid Fast Smear   Final    NO ACID FAST BACILLI SEEN Performed at [facility]    Culture   Final    CULTURE WILL BE EXAMINED FOR 6 WEEKS BEFORE ISSUING A FINAL REPORT Performed at [facility]    Report Status PENDING  Incomplete    Studies/Results: Dg Chest Port 1 View  04/25/2015   CLINICAL DATA:  Chest tube.  EXAM: PORTABLE CHEST - 1 VIEW  COMPARISON:  04/24/2015 .  CT 04/16/2015.  FINDINGS: Right IJ line and 3 right chest tubes in stable position. Mediastinum hilar structures are stable. Stable cardiomegaly. Postsurgical changes right lung. Right mid lung field infiltrate unchanged. This could represent postsurgical change. No pneumothorax. Mild stable right chest wall subcutaneous emphysema.  IMPRESSION: 1. Lines and tubes in stable position. Three right chest tubes are noted. No pneumothorax. Stable  right chest wall subcutaneous emphysema. 2. Postsurgical changes right lung with persistent dense infiltrate right mid lung. These changes could be postsurgical. These changes are in the region of previously identified mass.   Electronically Signed   By: Maisie Fus  Register   On: 04/25/2015 07:59   Dg Chest Port 1 View  04/24/2015   CLINICAL DATA:  Assess chest tube positioning, status post surgical resection of a cavitary mass in the  right upper lobe  EXAM: PORTABLE CHEST - 1 VIEW  COMPARISON:  Portable chest of April 23, 2015  FINDINGS: The left lung is well-expanded and clear. On the right the 3 chest tubes are unchanged. There is no pneumothorax or significant pleural effusion. There is a small amount of soft tissue gas in the right axilla. There is increased alveolar opacity in the right mid and upper lung. The cardiac silhouette is top-normal in size. The pulmonary vascularity is normal. The trachea is midline. The right internal jugular venous catheter tip projects over the cavoatrial junction.  IMPRESSION: Increased alveolar opacity in the right lung consistent with pneumonia or other alveolar filling process. There is no pleural effusion or pneumothorax. The chest tubes are in stable position.   Electronically Signed   By: David  [place] M.D.   On: 04/24/2015 07:42    Assessment/Plan:  1) pulmonary abscess s/p drainage/VATS - on broad spectrum antibiotics.  Had previously responded to levaquin with decreased WBC and fever prior to surgery so will narrow back to IV levaquin and he then could go home on oral levaquin 500 mg daily for a prolonged period, unless a culture develops that guides [place].    Staci Righter, MD [facility] [email or web address] [phone] pager   [phone] cell 04/25/2015, 1:26 PM

## 2015-04-25 NOTE — Progress Notes (Signed)
[facility] PROGRESS NOTE  Rodney Walker ZOX:[id number] DOB: 10-26-1981 DOA: 04/16/2015 PCP: No PCP Per Patient  Assessment/Plan: Sepsis secondary to postobstructive PNA with increased right upper lobe lung mass -Pt had been on Levaquin for broad coverage, now on vanc and zosyn -s/p RUL lobectomy on 4/26. -Supportive care with antitussives and Tylenol. 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 -During this admit, had counseled strongly on cessation.   HIV -Continued on ART.  -Follows with Dr. Drue Second.   Hyperkalemia -resolved  DVT prophylaxis -SCD's  Diet: Regular  Code Status: Full   HPI: Chart reviewed. Pt is now s/p VATS and lobectomy. Lytes stable. Overall pt seems stable. Pt currently on CTS service with ID following. Will sign off for now, please call if any questions.  Objective: Filed Vitals:   04/25/15 1500 04/25/15 1600 04/25/15 1632 04/25/15 1700  BP:  113/60  122/64  Pulse: 111 84  100  Temp: 99.5 F (37.5 C)     TempSrc: Oral     Resp: 35 32 29 31  Height:      Weight:      SpO2: 97% 97% 95% 97%    Intake/Output Summary (Last 24 hours) at 04/25/15 1800 Last data filed at 04/25/15 1700  Gross per 24 hour  Intake   1450 ml  Output   1810 ml  Net   -360 ml   Filed Weights   04/16/15 2009 04/24/15 0600 04/25/15 0500  Weight: 64.003 kg (141 lb 1.6 oz) 61 kg (134 lb 7.7 oz) 61 kg (134 lb 7.7 oz)     Data Reviewed: Basic Metabolic Panel:  Recent Labs Lab 04/22/15 1951 04/23/15 1033 04/23/15 1043 04/24/15 0415 04/25/15 0420  NA 135 135 135 131* 134*  K 3.5 3.6 3.6 3.7 4.0  CL 98  --   --  99 97  CO2 28  --   --  25 31  GLUCOSE 106* 141* 147* 128* 118*  BUN 9  --   --  5* 8  CREATININE 0.95  --   --  0.81 1.02  CALCIUM 8.3*  --   --  7.7* 8.0*   Liver Function Tests:  Recent Labs Lab 04/22/15 1951 04/25/15 0420  AST 52* 42*  ALT 73* 44   ALKPHOS 94 71  BILITOT 0.4 0.6  PROT 6.3 5.2*  ALBUMIN 2.0* 1.6*   No results for input(s): LIPASE, AMYLASE in the last 168 hours. No results for input(s): AMMONIA in the last 168 hours. CBC:  Recent Labs Lab 04/19/15 0521 04/22/15 1951 04/23/15 1033 04/23/15 1043 04/24/15 0415 04/25/15 0420  WBC 12.8* 7.7  --   --  11.1* 11.3*  HGB 11.6* 10.8* 18.7* 15.0 10.1* 9.1*  HCT 31.5* 29.3* 55.0* 44.0 27.9* 25.2*  MCV 85.8 85.7  --   --  86.4 86.0  PLT 161 221  --   --  245 262   Cardiac Enzymes: No results for input(s): CKTOTAL, CKMB, CKMBINDEX, TROPONINI in the last 168 hours. BNP (last 3 results) No results for input(s): BNP in the last 8760 hours.  ProBNP (last 3 results) No results for input(s): PROBNP in the last 8760 hours.  CBG: No results for input(s): GLUCAP in the last 168 hours.  Recent Results (from the past 240 hour(s))  Culture, blood (routine x 2)     Status: None   Collection Time: 04/16/15  3:14 PM  Result  Value Ref Range Status   Specimen Description BLOOD LEFT ARM  Final   Special Requests BOTTLES DRAWN AEROBIC AND ANAEROBIC  Final   Culture   Final    NO GROWTH 5 DAYS Performed at [facility]    Report Status 04/23/2015 FINAL  Final  Urine culture     Status: None   Collection Time: 04/16/15  6:02 PM  Result Value Ref Range Status   Specimen Description URINE, RANDOM  Final   Special Requests NONE  Final   Colony Count NO GROWTH Performed at [facility]   Final   Culture NO GROWTH Performed at [facility]   Final   Report Status 04/18/2015 FINAL  Final  Culture, blood (routine x 2)     Status: None   Collection Time: 04/16/15  6:15 PM  Result Value Ref Range Status   Specimen Description BLOOD RIGHT ARM  Final   Special Requests BOTTLES DRAWN AEROBIC AND ANAEROBIC 5CC  Final   Culture   Final    NO GROWTH 5 DAYS Performed at [facility]    Report Status 04/23/2015 FINAL  Final  Culture, blood  (routine x 2) Call MD if unable to obtain prior to antibiotics being given     Status: None   Collection Time: 04/16/15  9:10 PM  Result Value Ref Range Status   Specimen Description BLOOD RIGHT HAND  Final   Special Requests   Final    BOTTLES DRAWN AEROBIC AND ANAEROBIC 2CC BLUE 1CC RED   Culture   Final    NO GROWTH 5 DAYS Note: Culture results may be compromised due to an inadequate volume of blood received in culture bottles. Performed at [facility]    Report Status 04/23/2015 FINAL  Final  Culture, blood (routine x 2) Call MD if unable to obtain prior to antibiotics being given     Status: None   Collection Time: 04/16/15  9:20 PM  Result Value Ref Range Status   Specimen Description BLOOD LEFT HAND  Final   Special Requests   Final    BOTTLES DRAWN AEROBIC AND ANAEROBIC 3CC BLUE 2CC RED   Culture   Final    NO GROWTH 5 DAYS Note: Culture results may be compromised due to an inadequate volume of blood received in culture bottles. Performed at [facility]    Report Status 04/23/2015 FINAL  Final  Respiratory virus antigens panel     Status: None   Collection Time: 04/16/15  9:47 PM  Result Value Ref Range Status   Source - RVPAN NASAL SWAB  Corrected   Respiratory Syncytial Virus A Negative Negative Final   Respiratory Syncytial Virus B Negative Negative Final   Influenza A Negative Negative Final   Influenza B Negative Negative Final   Parainfluenza 1 Negative Negative Final   Parainfluenza 2 Negative Negative Final   Parainfluenza 3 Negative Negative Final   Metapneumovirus Negative Negative Final   Rhinovirus Negative Negative Final   Adenovirus Negative Negative Final    Comment: (NOTE) Performed At: [facility] [address] [place], [place] [id number] Mila Homer MD WU:[id number]   Bordetella pertussis PCR     Status: None   Collection Time: 04/16/15  9:52 PM  Result Value Ref Range Status   B pertussis, DNA Negative Negative  Final   B parapertussis, DNA Negative Negative Final    Comment: (NOTE) This test was developed and its performance characteristics determined  by [employer] [facility]. It has not been cleared or approved by the U.S. Food and Drug Administration. The FDA has determined that such clearance or approval is not necessary. This test is used for clinical purposes. It should not be regarded as investigational or research. Performed At: [facility] [address] [place], [place] [id number] Mila Homer MD NW:[id number]   Surgical pcr screen     Status: Abnormal   Collection Time: 04/19/15 10:30 PM  Result Value Ref Range Status   MRSA, PCR NEGATIVE NEGATIVE Final   Staphylococcus aureus POSITIVE (A) NEGATIVE Final    Comment:        The Xpert SA Assay (FDA approved for NASAL specimens in patients over 34 years of age), is one component of a comprehensive surveillance program.  Test performance has been validated by [facility] for patients greater than or equal to 60 year old. It is not intended to diagnose infection nor to guide or monitor treatment.   Anaerobic culture     Status: None (Preliminary result)   Collection Time: 04/23/15  9:08 AM  Result Value Ref Range Status   Specimen Description FLUID PLEURAL RIGHT  Final   Special Requests PT ON ZINACEF AND LEVAQUIN SPEC A  Final   Gram Stain   Final    MODERATE WBC PRESENT,BOTH PMN AND MONONUCLEAR NO ORGANISMS SEEN Performed at [facility]    Culture   Final    NO ANAEROBES ISOLATED; CULTURE IN PROGRESS FOR 5 DAYS Performed at [facility]    Report Status PENDING  Incomplete  Fungus Culture with Smear     Status: None (Preliminary result)   Collection Time: 04/23/15  9:08 AM  Result Value Ref Range Status   Specimen Description FLUID PLEURAL RIGHT  Final   Special Requests PT ON ZINACEF AND LEVAQUIN SPEC A  Final   Fungal Smear   Final    NO YEAST OR FUNGAL ELEMENTS SEEN Performed at  [facility]    Culture   Final    CULTURE IN PROGRESS FOR FOUR WEEKS Performed at [facility]    Report Status PENDING  Incomplete  Body fluid culture     Status: None (Preliminary result)   Collection Time: 04/23/15  9:08 AM  Result Value Ref Range Status   Specimen Description FLUID PLEURAL RIGHT  Final   Special Requests PT ON ZINACEF/LEVAQUIN SPEC A  Final   Gram Stain   Final    MODERATE WBC PRESENT,BOTH PMN AND MONONUCLEAR NO ORGANISMS SEEN Performed at [facility]    Culture   Final    NO GROWTH 1 DAY Performed at [facility]    Report Status PENDING  Incomplete  AFB culture with smear     Status: None (Preliminary result)   Collection Time: 04/23/15  9:08 AM  Result Value Ref Range Status   Specimen Description FLUID PLEURAL RIGHT  Final   Special Requests PT ON ZINACEF/LEVAQUIN SPEC A  Final   Acid Fast Smear   Final    NO ACID FAST BACILLI SEEN Performed at [facility]    Culture   Final    CULTURE WILL BE EXAMINED FOR 6 WEEKS BEFORE ISSUING A FINAL REPORT Performed at [facility]    Report Status PENDING  Incomplete  Fungus Culture with Smear     Status: None (Preliminary result)   Collection Time: 04/23/15  9:12 AM  Result Value Ref Range Status  Specimen Description FLUID PLEURAL RIGHT  Final   Special Requests PT ON ZINACEF/LEVAQUIN SPEC B  Final   Fungal Smear   Final    NO YEAST OR FUNGAL ELEMENTS SEEN Performed at [facility]    Culture   Final    CULTURE IN PROGRESS FOR FOUR WEEKS Performed at [facility]    Report Status PENDING  Incomplete  Body fluid culture     Status: None (Preliminary result)   Collection Time: 04/23/15  9:12 AM  Result Value Ref Range Status   Specimen Description FLUID PLEURAL RIGHT  Final   Special Requests PT ON ZINACEF/LEVAQUIN SPEC B  Final   Gram Stain   Final    MODERATE WBC PRESENT,BOTH PMN AND MONONUCLEAR NO ORGANISMS SEEN Performed at  [facility]    Culture   Final    NO GROWTH 1 DAY Performed at [facility]    Report Status PENDING  Incomplete  AFB culture with smear     Status: None (Preliminary result)   Collection Time: 04/23/15  9:12 AM  Result Value Ref Range Status   Specimen Description FLUID PLEURAL RIGHT  Final   Special Requests PT ON ZINCEF/LEVAQUIN SPEC B  Final   Acid Fast Smear   Final    NO ACID FAST BACILLI SEEN Performed at [facility]    Culture   Final    CULTURE WILL BE EXAMINED FOR 6 WEEKS BEFORE ISSUING A FINAL REPORT Performed at [facility]    Report Status PENDING  Incomplete     Studies: Dg Chest Port 1 View  04/25/2015   CLINICAL DATA:  Chest tube.  EXAM: PORTABLE CHEST - 1 VIEW  COMPARISON:  04/24/2015 .  CT 04/16/2015.  FINDINGS: Right IJ line and 3 right chest tubes in stable position. Mediastinum hilar structures are stable. Stable cardiomegaly. Postsurgical changes right lung. Right mid lung field infiltrate unchanged. This could represent postsurgical change. No pneumothorax. Mild stable right chest wall subcutaneous emphysema.  IMPRESSION: 1. Lines and tubes in stable position. Three right chest tubes are noted. No pneumothorax. Stable right chest wall subcutaneous emphysema. 2. Postsurgical changes right lung with persistent dense infiltrate right mid lung. These changes could be postsurgical. These changes are in the region of previously identified mass.   Electronically Signed   By: Maisie Fus  Register   On: 04/25/2015 07:59   Dg Chest Port 1 View  04/24/2015   CLINICAL DATA:  Assess chest tube positioning, status post surgical resection of a cavitary mass in the right upper lobe  EXAM: PORTABLE CHEST - 1 VIEW  COMPARISON:  Portable chest of April 23, 2015  FINDINGS: The left lung is well-expanded and clear. On the right the 3 chest tubes are unchanged. There is no pneumothorax or significant pleural effusion. There is a small amount of soft tissue  gas in the right axilla. There is increased alveolar opacity in the right mid and upper lung. The cardiac silhouette is top-normal in size. The pulmonary vascularity is normal. The trachea is midline. The right internal jugular venous catheter tip projects over the cavoatrial junction.  IMPRESSION: Increased alveolar opacity in the right lung consistent with pneumonia or other alveolar filling process. There is no pleural effusion or pneumothorax. The chest tubes are in stable position.   Electronically Signed   By: David  [place] M.D.   On: 04/24/2015 07:42    Scheduled Meds: . acetaminophen  1,000 mg Oral 4 times per  day   Or  . acetaminophen (TYLENOL) oral liquid 160 mg/5 mL  1,000 mg Oral 4 times per day  . antiseptic oral rinse  7 mL Mouth Rinse BID  . bisacodyl  10 mg Oral Daily  . dolutegravir  50 mg Oral Daily  . emtricitabine-tenofovir  1 tablet Oral Daily  . feeding supplement (ENSURE ENLIVE)  237 mL Oral BID BM  . fentaNYL   Intravenous 6 times per day  . furosemide  20 mg Intravenous Daily  . levofloxacin (LEVAQUIN) IV  500 mg Intravenous Q24H  . senna-docusate  1 tablet Oral QHS  . sodium chloride  10 mL Intravenous Q12H   Continuous Infusions: . bupivacaine ON-Q pain pump    . dextrose 5 % and 0.45 % NaCl with KCl 20 mEq/L 30 mL/hr (04/25/15 1738)    Principal Problem:   PNA (pneumonia) Active Problems:   Fever   Lung mass   HIV (human immunodeficiency virus infection)   Hyperkalemia   Empyema lung   Sepsis due to pneumonia   Sepsis   Bullous emphysema   Lobar pneumonia   Lung abscess   Rodney Walker K  [facility] Pager [phone]. If 7PM-7AM, please contact night-coverage at [email or web address], password [facility] 04/25/2015, 6:00 PM  LOS: 9 days

## 2015-04-25 NOTE — Progress Notes (Signed)
      [address]       [place] [address]             [phone]      Stable day  Awaiting step down bed  BP 135/58 mmHg  Pulse 114  Temp(Src) 99.5 F (37.5 C) (Oral)  Resp 23  Ht 5\' 4"  (1.626 m)  Wt 134 lb 7.7 oz (61 kg)  BMI 23.07 kg/m2  SpO2 96%   Intake/Output Summary (Last 24 hours) at 04/25/15 1825 Last data filed at 04/25/15 1800  Gross per 24 hour  Intake   1480 ml  Output   1810 ml  Net   -330 ml     Viviann Spare C. Dorris Fetch, MD [facility] [phone]

## 2015-04-26 ENCOUNTER — Inpatient Hospital Stay (HOSPITAL_COMMUNITY): Payer: Self-pay

## 2015-04-26 LAB — TYPE AND SCREEN
ABO/RH(D): O POS
Antibody Screen: NEGATIVE
Unit division: 0
Unit division: 0
Unit division: 0

## 2015-04-26 LAB — COMPREHENSIVE METABOLIC PANEL
ALT: 40 U/L (ref 0–53)
AST: 32 U/L (ref 0–37)
Albumin: 1.6 g/dL — ABNORMAL LOW (ref 3.5–5.2)
Alkaline Phosphatase: 80 U/L (ref 39–117)
Anion gap: 8 (ref 5–15)
BUN: 7 mg/dL (ref 6–23)
CO2: 31 mmol/L (ref 19–32)
Calcium: 8.2 mg/dL — ABNORMAL LOW (ref 8.4–10.5)
Chloride: 93 mmol/L — ABNORMAL LOW (ref 96–112)
Creatinine, Ser: 1.05 mg/dL (ref 0.50–1.35)
GFR calc Af Amer: >90 mL/min (ref >90)
GFR calc non Af Amer: >90 mL/min (ref >90)
Glucose, Bld: 122 mg/dL — ABNORMAL HIGH (ref 70–99)
Potassium: 3.6 mmol/L (ref 3.5–5.1)
Sodium: 132 mmol/L — ABNORMAL LOW (ref 135–145)
Total Bilirubin: 0.2 mg/dL — ABNORMAL LOW (ref 0.3–1.2)
Total Protein: 5.9 g/dL — ABNORMAL LOW (ref 6.0–8.3)

## 2015-04-26 LAB — CBC
HCT: 24.2 % — ABNORMAL LOW (ref 39.0–52.0)
Hemoglobin: 8.9 g/dL — ABNORMAL LOW (ref 13.0–17.0)
MCH: 31.4 pg (ref 26.0–34.0)
MCHC: 36.8 g/dL — ABNORMAL HIGH (ref 30.0–36.0)
MCV: 85.5 fL (ref 78.0–100.0)
Platelets: 305 10*3/uL (ref 150–400)
RBC: 2.83 MIL/uL — ABNORMAL LOW (ref 4.22–5.81)
RDW: 13.3 % (ref 11.5–15.5)
WBC: 15.8 10*3/uL — ABNORMAL HIGH (ref 4.0–10.5)

## 2015-04-26 MED ORDER — KETOROLAC TROMETHAMINE 15 MG/ML IJ SOLN
15.0000 mg | Freq: Four times a day (QID) | INTRAMUSCULAR | Status: DC
  Administered 2015-04-26 – 2015-04-28 (×8): 15 mg via INTRAVENOUS
  Filled 2015-04-26 (×14): qty 1

## 2015-04-26 MED ORDER — LACTULOSE 10 GM/15ML PO SOLN
20.0000 g | Freq: Once | ORAL | Status: DC
  Filled 2015-04-26: qty 30

## 2015-04-26 MED ORDER — PIPERACILLIN-TAZOBACTAM 3.375 G IVPB
3.3750 g | Freq: Three times a day (TID) | INTRAVENOUS | Status: DC
  Administered 2015-04-26 – 2015-04-28 (×6): 3.375 g via INTRAVENOUS
  Filled 2015-04-26 (×9): qty 50

## 2015-04-26 MED ORDER — LACTULOSE 10 GM/15ML PO SOLN
30.0000 g | Freq: Once | ORAL | Status: AC
  Administered 2015-04-26: 30 g via ORAL
  Filled 2015-04-26: qty 45

## 2015-04-26 MED ORDER — POTASSIUM CHLORIDE CRYS ER 20 MEQ PO TBCR
40.0000 meq | EXTENDED_RELEASE_TABLET | Freq: Once | ORAL | Status: AC
  Administered 2015-04-26: 40 meq via ORAL
  Filled 2015-04-26: qty 2

## 2015-04-26 MED ORDER — FLEET ENEMA 7-19 GM/118ML RE ENEM
1.0000 | ENEMA | Freq: Once | RECTAL | Status: AC
  Administered 2015-04-26: 1 via RECTAL
  Filled 2015-04-26: qty 1

## 2015-04-26 MED ORDER — VANCOMYCIN HCL IN DEXTROSE 1-5 GM/200ML-% IV SOLN
1000.0000 mg | Freq: Two times a day (BID) | INTRAVENOUS | Status: DC
  Administered 2015-04-26 – 2015-04-28 (×4): 1000 mg via INTRAVENOUS
  Filled 2015-04-26 (×6): qty 200

## 2015-04-26 MED ORDER — CYCLOBENZAPRINE HCL 10 MG PO TABS
10.0000 mg | ORAL_TABLET | Freq: Three times a day (TID) | ORAL | Status: DC
  Administered 2015-04-26 – 2015-04-28 (×7): 10 mg via ORAL
  Filled 2015-04-26 (×10): qty 1

## 2015-04-26 NOTE — Progress Notes (Signed)
This morning Mr. Vallecillo coughed up small amount of thick bloody sputum, he says he was having 7/10 pain as he was trying to cough it up. Once he cleared the sputum he "felt better." Paged Claiborne Billings with [facility] to update as sputum had not been bloody.

## 2015-04-26 NOTE — Discharge Summary (Signed)
Physician Discharge Summary       [address] [place]       [name] [address]             [phone]    Patient ID: Rodney Walker MRN: [id number] DOB/AGE: October 15, 1981 34 y.o.  Admit date: 04/16/2015 Discharge date: 04/28/2015  Admission Diagnoses:  Discharge Diagnoses:  Principal Problem:   PNA (pneumonia) Active Problems:   Fever   Lung mass   HIV (human immunodeficiency virus infection)   Hyperkalemia   Empyema lung   Sepsis due to pneumonia   Sepsis   Bullous emphysema   Lobar pneumonia   Lung abscess   Consults: ID  Procedure (s): 1. Right video-assisted thoracic surgery, right thoracotomy with right  upper lobectomy and right middle lobectomy. 2. Placement of wound On-Q pain analgesia system by Dr. Donata Clay on 04/23/2015.  History of Presenting Illness: This is a 34 yo male HIV positive since March 2015 with a right upper lung mass for past year (s/p lung biopsy 5 days ago) who has had night spikes in fever of over 102 for past 4 nights. He has a cough, which is chronic and has been having upper respiratory symptoms for several weeks. He has had no lower extremity edema or swelling , no chest pain, no dysuria, or rashes. He has had no nausea, vomiting, diarrhea, or abdominal pain. He saw his primary care physician yesterday who started him on doxycycline but pt could not fill this because he could not afford to pay for the medication. CT showed an increasing right upper lung mass-like opacity, scattered bilateral pulmonary nodules, and  possible post op pneumonititis. He was referred for admission by medicine for pneumonia in an HIV positive patient. He has been seen by CCM/pulmonary. Dr. Donata Clay was then consulted because of worsening sepsis despite antibiotic therapy and a right upper lobe abcess that was increasing in size.  Brief Hospital Course:  He has remained afebrile and hemodynamically stable. Daily chest rays were taken and  gradually improved. Chest tubes did have an air leak. Eventually, air leak resolved. One chest tube was removed on 04/28 and one on 04/29. Remaining chest tube was removed 04/26/2015. He is on 2 liters of oxygen currently, but will likely be weaned to room air. He initially was put on IV Vanco and Zosyn. Dr. Luciana Axe from infectious disease then put him on IV Levaquin post op. This will be switched to oral at discharge. He has been tolerating a diet and has had a bowel movement. Wounds are continuing to heal and there are no signs of infection. He is felt surgically stable for discharge today.  Latest Vital Signs: Blood pressure 115/67, pulse 82, temperature 98.6 F (37 C), temperature source Oral, resp. rate 22, height  (1.651 m), weight 134 lb 7.7 oz (61 kg), SpO2 95 %.  Physical Exam: Cardiovascular: RRR. Pulmonary: Clear to auscultation on left and coarse (rhonchi) on right Abdomen: Soft, non tender, bowel sounds present. Extremities: No lower extremity edema. Wounds: Clean and dry. No erythema or signs of infection. Chest Tubes: to suction, no air leak  Discharge Condition:Stable and discharged to home  Recent laboratory studies:  Lab Results  Component Value Date   WBC 10.5 04/28/2015   HGB 7.9* 04/28/2015   HCT 21.8* 04/28/2015   MCV 84.8 04/28/2015   PLT 317 04/28/2015   Lab Results  Component Value Date   NA 133* 04/28/2015   K 3.9 04/28/2015  CL 95* 04/28/2015   CO2 32 04/28/2015   CREATININE 0.97 04/28/2015   GLUCOSE 106* 04/28/2015      Diagnostic Studies: Dg Chest 2 View  04/26/2015   CLINICAL DATA:  Shortness of breath.  Right chest tube.  EXAM: CHEST  2 VIEW  COMPARISON:  04/25/2015.  FINDINGS: Interim removal of lower right chest tube. 2 remaining right chest tubes in stable position. Right IJ line in stable position . Heart size stable. Stable postsurgical changes right lung with persistent dense infiltrate in the right mid lung in the region of previously  identified mass. Heart size stable. No significant pleural effusion or pneumothorax. Stable right chest wall subcutaneous emphysema.  IMPRESSION: 1. Interim removal of the lower right chest tube. The 2 remaining right chest tubes in stable position. No pneumothorax. Right IJ line in stable position. Stable right chest wall subcutaneous emphysema. 2. Stable postsurgical changes right lung.   Electronically Signed   By: Maisie Fus  Register   On: 04/26/2015 07:53   Ct Chest Wo Contrast  03/28/2015   CLINICAL DATA:  Followup pulmonary mass, shortness of breath, history of myocardial infarction and HIV. Subsequent encounter.  EXAM: CT CHEST WITHOUT CONTRAST  TECHNIQUE: Multidetector CT imaging of the chest was performed following the standard protocol without IV contrast.  COMPARISON:  10/01/2014, 08/31/2014, 03/18/2014 and 06/15/2009.  FINDINGS: Mediastinum/Nodes: Mediastinal lymph nodes measure up to 10 mm in the lower right paratracheal station, stable. No pericardial effusion.  Lungs/Pleura: Bullous emphysema with centrilobular and paraseptal components. Bullous changes are worst in the apex of the right hemi thorax. There is a nodular masslike opacity in the right suprahilar region, measuring approximately 2.5 x 3.5 cm (series 3, image 23), stable from 10/01/2014 but slightly smaller than on 08/31/2014 (2.7 x 3.8 cm) and new from 03/18/2014. There is surrounding nodularity, possibly postobstructive in etiology. Tiny nodules in the left upper lobe measure up to 6 mm and are unchanged from 08/31/2014 but new from 03/18/2014. No pleural fluid. Airway is otherwise unremarkable.  Upper abdomen: Visualized portions of the liver and adrenal glands are unremarkable. There may be a punctate stone in the right kidney. Visualized portions of the kidneys, spleen, pancreas, stomach and bowel are otherwise grossly unremarkable. No upper abdominal adenopathy.  Musculoskeletal: No worrisome lytic or sclerotic lesions.  IMPRESSION:  1. Masslike area consolidation in the suprahilar right upper lobe, with surrounding nodularity, stable from 10/01/2014 and slightly smaller from 08/31/2014. Finding is new from 03/18/2014. Finding is worrisome for malignancy. Given the patient's HIV status, an atypical infectious process is not excluded. 2. Scattered small left upper lobe nodules are unchanged from 08/31/2014. 3. Probable punctate stone in the right kidney.   Electronically Signed   By: Leanna Battles M.D.   On: 03/28/2015 10:21   Ct Chest W Contrast  04/16/2015   CLINICAL DATA:  Shortness of breath, chest pain, upper back pain, fever, cough, history coronary artery disease, HIV  EXAM: CT CHEST WITH CONTRAST  TECHNIQUE: Multidetector CT imaging of the chest was performed during intravenous contrast administration. Sagittal and coronal MPR images reconstructed from axial data set.  CONTRAST:  80mL OMNIPAQUE IOHEXOL 300 MG/ML  SOLN IV  COMPARISON:  03/28/2015  FINDINGS: Thoracic vascular structures grossly patent on nondedicated exam.  Minimally enlarged RIGHT peritracheal lymph node 11 mm short axis image 14 little changed.  Multiple additional normal size mediastinal lymph nodes identified.  Visualized portion of upper abdomen unremarkable.  Severe bullous disease RIGHT apex with scattered emphysematous changes  in both upper lobes.  Persistent masslike opacity in RIGHT upper lobe 4.8 x 4.6 cm image 21 previously 3.5 x 2.5 cm.  Several small adjacent nodular foci are identified with additional postobstructive pneumonitis in the RIGHT upper lobe.  Stable small nonspecific nodular foci in both lungs.  Dependent atelectasis in the lower lobes bilaterally.  No pulmonary infiltrate, pleural effusion or pneumothorax.  Osseous structures unremarkable.  IMPRESSION: Increase in size of mass-like opacity in RIGHT upper lobe now measuring 4.8 x 4.6 cm, worrisome for malignancy.  Postobstructive pneumonitis RIGHT upper lobe.  Severe emphysematous changes  with bullous disease particularly at RIGHT apex.  Scattered BILATERAL pulmonary nodules, little changed since 03/28/2015; recommend assessment on follow-up imaging.   Electronically Signed   By: Ulyses Southward M.D.   On: 04/16/2015 17:49   Discharge Medications:    Medication List    STOP taking these medications        doxycycline 100 MG tablet  Commonly known as:  VIBRA-TABS      TAKE these medications        albuterol 108 (90 BASE) MCG/ACT inhaler  Commonly known as:  PROVENTIL HFA;VENTOLIN HFA  Inhale 2 puffs into the lungs every 6 (six) hours as needed for wheezing or shortness of breath.     aspirin 325 MG tablet  Take 650 mg by mouth daily as needed for fever.     cyclobenzaprine 10 MG tablet  Commonly known as:  FLEXERIL  Take 1 tablet (10 mg total) by mouth 3 (three) times daily as needed for muscle spasms.     dolutegravir 50 MG tablet  Commonly known as:  TIVICAY  Take 1 tablet (50 mg total) by mouth daily.     emtricitabine-tenofovir 200-300 MG per tablet  Commonly known as:  TRUVADA  Take 1 tablet by mouth daily.     TRUVADA 200-300 MG per tablet  Generic drug:  emtricitabine-tenofovir  TAKE 1 TABLET BY MOUTH DAILY     ibuprofen 200 MG tablet  Commonly known as:  ADVIL,MOTRIN  Take 200 mg by mouth every 6 (six) hours as needed (pain).     levofloxacin 750 MG tablet  Commonly known as:  LEVAQUIN  Take 1 tablet (750 mg total) by mouth daily.     oxyCODONE 5 MG immediate release tablet  Commonly known as:  Oxy IR/ROXICODONE  Take 1-2 tablets (5-10 mg total) by mouth every 4 (four) hours as needed for severe pain.     traMADol 50 MG tablet  Commonly known as:  ULTRAM  Take 1-2 tablets (50-100 mg total) by mouth every 6 (six) hours as needed (mild pain).          Follow Up Appointments: Follow-up Information    Follow up with [facility]     On 04/29/2015.   Why:  10:30 am for hospital follow up   Contact information:    [address] [place] [address] [phone]      Follow up with Kerin Perna III, MD On 05/13/2015.   Specialty:  Cardiothoracic Surgery   Why:  PA/LAT CXR to be taken (at [facility] which is in the same builidng as Dr. Zenaida Niece Trigt's office) on 05/13/2015 at 12:00 pm;Appointment time is at 1:00 pm   Contact information:   [address] [employer] [address] [place] [address] [phone]       Follow up with Nurse On 05/07/2015.   Why:  Appointment is  with nurse only for chest tube suture removal. Appointment time is at 10:00 am   Contact information:   [address] [employer] [address] [place] [address] [phone]      Follow up with Judyann Munson, MD.   Specialty:  Infectious Diseases   Why:  Office will contact you wtih appointment   Contact information:   [address] [place] [address] [phone]       Signed: Marrion Coy 04/28/2015, 11:00 AM

## 2015-04-26 NOTE — Progress Notes (Addendum)
      [address]       [name] [address]             [phone]       3 Days Post-Op Procedure(s) (LRB): VIDEO ASSISTED THORACOSCOPY (VATS)/RIGHT upper  and right middle LOBECTOMY (Right)  Subjective: Patient with pain at chest tube sites. Passing flatus but no bowel movement yet.  Objective: Vital signs in last 24 hours: Temp:  [98 F (36.7 C)-99.6 F (37.6 C)] 99 F (37.2 C) (04/29 0847) Pulse Rate:  [76-114] 84 (04/29 0847) Cardiac Rhythm:  [-] Normal sinus rhythm;Sinus tachycardia (04/29 0847) Resp:  [19-35] 28 (04/29 0847) BP: (108-138)/(58-73) 116/65 mmHg (04/29 0847) SpO2:  [89 %-100 %] 99 % (04/29 0847)     Intake/Output from previous day: 04/28 0701 - 04/29 0700 In: 1039.5 [P.O.:360; I.V.:679.5] Out: 2850 [Urine:2700; Chest Tube:150]   Physical Exam:  Cardiovascular: RRR. Pulmonary: Clear to auscultation on left and coarse (rhonchi) on right Abdomen: Soft, non tender, bowel sounds present. Extremities: No lower extremity edema. Wounds: Clean and dry.  No erythema or signs of infection. Chest Tubes: to suction, no air leak  Lab Results: CBC: Recent Labs  04/25/15 0420 04/26/15 0441  WBC 11.3* 15.8*  HGB 9.1* 8.9*  HCT 25.2* 24.2*  PLT 262 305   BMET:  Recent Labs  04/25/15 0420 04/26/15 0441  NA 134* 132*  K 4.0 3.6  CL 97 93*  CO2 31 31  GLUCOSE 118* 122*  BUN 8 7  CREATININE 1.02 1.05  CALCIUM 8.0* 8.2*    PT/INR: No results for input(s): LABPROT, INR in the last 72 hours. ABG:  INR: Will add last result for INR, ABG once components are confirmed Will add last 4 CBG results once components are confirmed  Assessment/Plan:  1. CV - SR in the 80's. 2.  Pulmonary - Chest tubes with 50 cc last 24 hours. Chest tubes are to suction and no air leak. Likely remove another chest tube today. On 2 liters of oxygen via [place] as tolerates. CXR shows no pneumothorax, subcutaneous emphysema right lateral chest wall. 3.  ID- On Levaquin IV. WBC increased from 11.3 to 15.8 and has low grade fever. No wound infection.Infectious disease following. 4. Anemia- H and H 8.9 and 24.2 5. Supplement potassium 6. Remove on Q 7. LOC for constipation  ZIMMERMAN,DONIELLE MPA-C 04/26/2015,9:49 AM   CXR with interval improvement DC anterior tube Agree with plan for po Levaquin at DC- 4 weeks  While in hospital give vanc and Zosyn/ patient examined and medical record reviewed,agree with above note. Kathlee Nations Trigt III 04/26/2015

## 2015-04-26 NOTE — Progress Notes (Addendum)
[facility]  Date of Admission:  04/16/2015  Antibiotics: Vancomycin and zosyn started post op Previously on levaquin Augmentin prior to that  Subjective: No acute events  Objective: Temp:  [98 F (36.7 C)-99.6 F (37.6 C)] 99 F (37.2 C) (04/29 0847) Pulse Rate:  [76-114] 84 (04/29 0847) Resp:  [19-35] 28 (04/29 0847) BP: (108-138)/(58-73) 116/65 mmHg (04/29 0847) SpO2:  [89 %-100 %] 99 % (04/29 0847)  General: awake, alert, nad Skin: no rashes Lungs: CTAB Cor: RRR Abdomen: soft, nt Ext: no edema Chest :+ tubes  Lab Results Lab Results  Component Value Date   WBC 15.8* 04/26/2015   HGB 8.9* 04/26/2015   HCT 24.2* 04/26/2015   MCV 85.5 04/26/2015   PLT 305 04/26/2015    Lab Results  Component Value Date   CREATININE 1.05 04/26/2015   BUN 7 04/26/2015   NA 132* 04/26/2015   K 3.6 04/26/2015   CL 93* 04/26/2015   CO2 31 04/26/2015    Lab Results  Component Value Date   ALT 40 04/26/2015   AST 32 04/26/2015   ALKPHOS 80 04/26/2015   BILITOT 0.2* 04/26/2015      Microbiology: Recent Results (from the past 240 hour(s))  Culture, blood (routine x 2)     Status: None   Collection Time: 04/16/15  3:14 PM  Result Value Ref Range Status   Specimen Description BLOOD LEFT ARM  Final   Special Requests BOTTLES DRAWN AEROBIC AND ANAEROBIC  Final   Culture   Final    NO GROWTH 5 DAYS Performed at [facility]    Report Status 04/23/2015 FINAL  Final  Urine culture     Status: None   Collection Time: 04/16/15  6:02 PM  Result Value Ref Range Status   Specimen Description URINE, RANDOM  Final   Special Requests NONE  Final   Colony Count NO GROWTH Performed at [facility]   Final   Culture NO GROWTH Performed at [facility]   Final   Report Status 04/18/2015 FINAL  Final  Culture, blood (routine x 2)     Status: None   Collection Time: 04/16/15  6:15 PM  Result Value Ref Range Status   Specimen Description BLOOD RIGHT ARM  Final   Special Requests BOTTLES DRAWN AEROBIC AND ANAEROBIC 5CC  Final   Culture   Final    NO GROWTH 5 DAYS Performed at [facility]    Report Status 04/23/2015 FINAL  Final  Culture, blood (routine x 2) Call MD if unable to obtain prior to antibiotics being given     Status: None   Collection Time: 04/16/15  9:10 PM  Result Value Ref Range Status   Specimen Description BLOOD RIGHT HAND  Final   Special Requests   Final    BOTTLES DRAWN AEROBIC AND ANAEROBIC 2CC BLUE 1CC RED   Culture   Final    NO GROWTH 5 DAYS Note: Culture results may be compromised due to an inadequate volume of blood received in culture bottles. Performed at [facility]    Report Status 04/23/2015 FINAL  Final  Culture, blood (routine x 2) Call MD if unable to obtain prior to antibiotics being given     Status: None   Collection Time: 04/16/15  9:20 PM  Result Value Ref Range Status   Specimen Description BLOOD LEFT HAND  Final   Special Requests   Final  BOTTLES DRAWN AEROBIC AND ANAEROBIC 3CC BLUE 2CC RED   Culture   Final    NO GROWTH 5 DAYS Note: Culture results may be compromised due to an inadequate volume of blood received in culture bottles. Performed at [facility]    Report Status 04/23/2015 FINAL  Final  Respiratory virus antigens panel     Status: None   Collection Time: 04/16/15  9:47 PM  Result Value Ref Range Status   Source - RVPAN NASAL SWAB  Corrected   Respiratory Syncytial Virus A Negative Negative Final   Respiratory Syncytial Virus B Negative Negative Final   Influenza A Negative Negative Final   Influenza B Negative Negative Final   Parainfluenza 1 Negative Negative Final   Parainfluenza 2 Negative Negative Final   Parainfluenza 3 Negative Negative Final   Metapneumovirus Negative Negative Final   Rhinovirus Negative Negative Final   Adenovirus Negative Negative Final    Comment: (NOTE) Performed At: [facility] [address] [place], [place] [id number] Mila Homer MD WU:[id number]   Bordetella pertussis PCR     Status: None   Collection Time: 04/16/15  9:52 PM  Result Value Ref Range Status   B pertussis, DNA Negative Negative Final   B parapertussis, DNA Negative Negative Final    Comment: (NOTE) This test was developed and its performance characteristics determined by [facility] [employer] [facility]. It has not been cleared or approved by the U.S. Food and Drug Administration. The FDA has determined that such clearance or approval is not necessary. This test is used for clinical purposes. It should not be regarded as investigational or research. Performed At: [facility] [address] [place], [place] [id number] Mila Homer MD VH:[id number]   Surgical pcr screen     Status: Abnormal   Collection Time: 04/19/15 10:30 PM  Result Value Ref Range Status   MRSA, PCR NEGATIVE NEGATIVE Final   Staphylococcus aureus POSITIVE (A) NEGATIVE Final    Comment:        The Xpert SA Assay (FDA approved for NASAL specimens in patients over 41 years of age), is one component of a comprehensive surveillance program.  Test performance has been validated by [facility] for patients greater than or equal to 59 year old. It is not intended to diagnose infection nor to guide or monitor treatment.   Anaerobic culture     Status: None (Preliminary result)   Collection Time: 04/23/15  9:08 AM  Result Value Ref Range Status   Specimen Description FLUID PLEURAL RIGHT  Final   Special Requests PT ON ZINACEF AND LEVAQUIN SPEC A  Final   Gram Stain   Final    MODERATE WBC PRESENT,BOTH PMN AND MONONUCLEAR NO ORGANISMS SEEN Performed at [facility]    Culture   Final    NO ANAEROBES ISOLATED; CULTURE IN PROGRESS FOR 5 DAYS Performed at [facility]    Report Status PENDING  Incomplete  Fungus Culture with Smear     Status: None (Preliminary result)    Collection Time: 04/23/15  9:08 AM  Result Value Ref Range Status   Specimen Description FLUID PLEURAL RIGHT  Final   Special Requests PT ON ZINACEF AND LEVAQUIN SPEC A  Final   Fungal Smear   Final    NO YEAST OR FUNGAL ELEMENTS SEEN Performed at [facility]    Culture   Final    CULTURE IN PROGRESS FOR FOUR WEEKS Performed at [facility]  Report Status PENDING  Incomplete  Body fluid culture     Status: None (Preliminary result)   Collection Time: 04/23/15  9:08 AM  Result Value Ref Range Status   Specimen Description FLUID PLEURAL RIGHT  Final   Special Requests PT ON ZINACEF/LEVAQUIN SPEC A  Final   Gram Stain   Final    MODERATE WBC PRESENT,BOTH PMN AND MONONUCLEAR NO ORGANISMS SEEN Performed at [facility]    Culture   Final    NO GROWTH 2 DAYS Performed at [facility]    Report Status PENDING  Incomplete  AFB culture with smear     Status: None (Preliminary result)   Collection Time: 04/23/15  9:08 AM  Result Value Ref Range Status   Specimen Description FLUID PLEURAL RIGHT  Final   Special Requests PT ON ZINACEF/LEVAQUIN SPEC A  Final   Acid Fast Smear   Final    NO ACID FAST BACILLI SEEN Performed at [facility]    Culture   Final    CULTURE WILL BE EXAMINED FOR 6 WEEKS BEFORE ISSUING A FINAL REPORT Performed at [facility]    Report Status PENDING  Incomplete  Fungus Culture with Smear     Status: None (Preliminary result)   Collection Time: 04/23/15  9:12 AM  Result Value Ref Range Status   Specimen Description FLUID PLEURAL RIGHT  Final   Special Requests PT ON ZINACEF/LEVAQUIN SPEC B  Final   Fungal Smear   Final    NO YEAST OR FUNGAL ELEMENTS SEEN Performed at [facility]    Culture   Final    CULTURE IN PROGRESS FOR FOUR WEEKS Performed at [facility]    Report Status PENDING  Incomplete  Body fluid culture     Status: None (Preliminary result)   Collection Time: 04/23/15   9:12 AM  Result Value Ref Range Status   Specimen Description FLUID PLEURAL RIGHT  Final   Special Requests PT ON ZINACEF/LEVAQUIN SPEC B  Final   Gram Stain   Final    MODERATE WBC PRESENT,BOTH PMN AND MONONUCLEAR NO ORGANISMS SEEN Performed at [facility]    Culture   Final    NO GROWTH 2 DAYS Performed at [facility]    Report Status PENDING  Incomplete  AFB culture with smear     Status: None (Preliminary result)   Collection Time: 04/23/15  9:12 AM  Result Value Ref Range Status   Specimen Description FLUID PLEURAL RIGHT  Final   Special Requests PT ON ZINCEF/LEVAQUIN SPEC B  Final   Acid Fast Smear   Final    NO ACID FAST BACILLI SEEN Performed at [facility]    Culture   Final    CULTURE WILL BE EXAMINED FOR 6 WEEKS BEFORE ISSUING A FINAL REPORT Performed at [facility]    Report Status PENDING  Incomplete    Studies/Results: Dg Chest 2 View  04/26/2015   CLINICAL DATA:  Shortness of breath.  Right chest tube.  EXAM: CHEST  2 VIEW  COMPARISON:  04/25/2015.  FINDINGS: Interim removal of lower right chest tube. 2 remaining right chest tubes in stable position. Right IJ line in stable position . Heart size stable. Stable postsurgical changes right lung with persistent dense infiltrate in the right mid lung in the region of previously identified mass. Heart size stable. No significant pleural effusion or pneumothorax. Stable right chest wall subcutaneous emphysema.  IMPRESSION:  1. Interim removal of the lower right chest tube. The 2 remaining right chest tubes in stable position. No pneumothorax. Right IJ line in stable position. Stable right chest wall subcutaneous emphysema. 2. Stable postsurgical changes right lung.   Electronically Signed   By: Maisie Fus  Register   On: 04/26/2015 07:53   Dg Chest Port 1 View  04/25/2015   CLINICAL DATA:  Chest tube.  EXAM: PORTABLE CHEST - 1 VIEW  COMPARISON:  04/24/2015 .  CT 04/16/2015.  FINDINGS: Right IJ  line and 3 right chest tubes in stable position. Mediastinum hilar structures are stable. Stable cardiomegaly. Postsurgical changes right lung. Right mid lung field infiltrate unchanged. This could represent postsurgical change. No pneumothorax. Mild stable right chest wall subcutaneous emphysema.  IMPRESSION: 1. Lines and tubes in stable position. Three right chest tubes are noted. No pneumothorax. Stable right chest wall subcutaneous emphysema. 2. Postsurgical changes right lung with persistent dense infiltrate right mid lung. These changes could be postsurgical. These changes are in the region of previously identified mass.   Electronically Signed   By: Maisie Fus  Register   On: 04/25/2015 07:59    Assessment/Plan:  1) pulmonary abscess s/p drainage/VATS - on broad spectrum antibiotics.  Had previously responded to levaquin with decreased WBC and fever prior to surgery and have narrowed back to levaquin. WBC up to 15 today but no fever.    Dr. Drue Second will monitor for clinical changes over the weekend on Epic and Dr. Ninetta Lights to follow up on Monday.   2) hiv - on tivicay and truvada. Well controlled   Rodney Walker, Molly Maduro, MD [facility] [email or web address] [phone] pager   [phone] cell 04/26/2015, 11:43 AM

## 2015-04-26 NOTE — Discharge Instructions (Signed)
Thoracotomy, Care After °Refer to this sheet in the next few weeks. These instructions provide you with information on caring for yourself after your procedure. Your health care provider may also give you more specific instructions. Your treatment has been planned according to current medical practices, but problems sometimes occur. Call your health care provider if you have any problems or questions after your procedure. °WHAT TO EXPECT AFTER YOUR PROCEDURE °After your procedure, it is typical to have the following sensations: °· You may feel pain at the incision site. °· You may be constipated from the pain medicine given and the change in your level of activity. °· You may feel extremely tired. °HOME CARE INSTRUCTIONS °· Take over-the-counter or prescription medicines for pain, discomfort, or fever only as directed by your health care provider. It is very important to take pain relieving medicine before your pain becomes severe. You will be able to breathe and cough more comfortably if your pain is well controlled. °· Take deep breaths. Deep breathing helps to keep your lungs inflated and protects against a lung infection (pneumonia). °· Cough frequently. Even though coughing may cause discomfort, coughing is important to clear mucus (phlegm) and expand your lungs. Coughing helps prevent pneumonia. If it hurts to cough, hold a pillow against your chest when you cough. This may help with the discomfort. °· Continue to use an incentive spirometer as directed. The use of an incentive spirometer helps to keep your lungs inflated and protects against pneumonia. °· Change the bandages over your incision as needed or as directed by your health care provider. °· Remove the bandages over your chest tube site as directed by your health care provider. °· Resume your normal diet as directed. It is important to have adequate protein, calories, vitamins, and minerals to promote healing. °· Prevent constipation. °¨ Eat  high-fiber foods such as whole grain cereals and breads, brown rice, beans, and fresh fruits and vegetables. °¨ Drink enough water and fluids to keep your urine clear or pale yellow. Avoid drinking beverages containing caffeine. Beverages containing caffeine can cause dehydration and harden your stool. °¨ Talk to your health care provider about taking a stool softener or laxative. °· Avoid lifting until you are instructed otherwise. °· Do not drive until directed by your health care provider.  Do not drive while taking pain medicines (narcotics). °· Do not bathe, swim, or use a hot tub until directed by your health care provider. You may shower instead. Gently wash the area of your incision with water and soap as directed. Do not use anything else to clean your incision except as directed by your health care provider. °· Do not use any tobacco products including cigarettes, chewing tobacco, or electronic cigarettes. °· Avoid secondhand smoke. °· Schedule an appointment for stitch (suture) or staple removal as directed. °· Schedule and attend all follow-up visits as directed by your health care provider. It is important to keep all your appointments. °· Participate in pulmonary rehabilitation as directed by your health care provider. °· Do not travel by airplane for 2 weeks after your chest tube is removed. °SEEK MEDICAL CARE IF: °· You are bleeding from your wounds. °· Your heartbeat seems irregular. °· You have redness, swelling, or increasing pain in the wounds. °· There is pus coming from your wounds. °· There is a bad smell coming from the wound or dressing. °· You have a fever or chills. °· You have nausea or are vomiting. °· You have muscle aches. °SEEK   IMMEDIATE MEDICAL CARE IF: °· You have a rash. °· You have difficulty breathing. °· You have a reaction or side effect to medicines given. °· You have persistent nausea. °· You have lightheadedness or feel faint. °· You have shortness of breath or chest  pain. °· You have persistent pain. °Document Released: 05/29/2011 Document Revised: 12/19/2013 Document Reviewed: 08/02/2013 °ExitCare® Patient Information ©2015 [employer]. This information is not intended to replace advice given to you by your health care provider. Make sure you discuss any questions you have with your health care provider. ° °

## 2015-04-27 ENCOUNTER — Inpatient Hospital Stay (HOSPITAL_COMMUNITY): Payer: Self-pay

## 2015-04-27 LAB — CBC
HCT: 22.5 % — ABNORMAL LOW (ref 39.0–52.0)
Hemoglobin: 8.2 g/dL — ABNORMAL LOW (ref 13.0–17.0)
MCH: 31.1 pg (ref 26.0–34.0)
MCHC: 36.4 g/dL — ABNORMAL HIGH (ref 30.0–36.0)
MCV: 85.2 fL (ref 78.0–100.0)
Platelets: 296 10*3/uL (ref 150–400)
RBC: 2.64 MIL/uL — ABNORMAL LOW (ref 4.22–5.81)
RDW: 13.5 % (ref 11.5–15.5)
WBC: 13.9 10*3/uL — ABNORMAL HIGH (ref 4.0–10.5)

## 2015-04-27 LAB — COMPREHENSIVE METABOLIC PANEL
ALT: 37 U/L (ref 0–53)
AST: 33 U/L (ref 0–37)
Albumin: 1.6 g/dL — ABNORMAL LOW (ref 3.5–5.2)
Alkaline Phosphatase: 82 U/L (ref 39–117)
Anion gap: 7 (ref 5–15)
BUN: 8 mg/dL (ref 6–23)
CO2: 33 mmol/L — ABNORMAL HIGH (ref 19–32)
Calcium: 8.4 mg/dL (ref 8.4–10.5)
Chloride: 93 mmol/L — ABNORMAL LOW (ref 96–112)
Creatinine, Ser: 1.04 mg/dL (ref 0.50–1.35)
GFR calc Af Amer: >90 mL/min (ref >90)
GFR calc non Af Amer: >90 mL/min (ref >90)
Glucose, Bld: 125 mg/dL — ABNORMAL HIGH (ref 70–99)
Potassium: 3.9 mmol/L (ref 3.5–5.1)
Sodium: 133 mmol/L — ABNORMAL LOW (ref 135–145)
Total Bilirubin: 0.4 mg/dL (ref 0.3–1.2)
Total Protein: 5.9 g/dL — ABNORMAL LOW (ref 6.0–8.3)

## 2015-04-27 LAB — BODY FLUID CULTURE: Culture: NO GROWTH

## 2015-04-27 MED ORDER — LACTULOSE 10 GM/15ML PO SOLN
20.0000 g | Freq: Every day | ORAL | Status: DC | PRN
  Administered 2015-04-28: 20 g via ORAL
  Filled 2015-04-27 (×2): qty 30

## 2015-04-27 NOTE — Progress Notes (Addendum)
      [address]       [name] [address]             [phone]      4 Days Post-Op Procedure(s) (LRB): VIDEO ASSISTED THORACOSCOPY (VATS)/RIGHT upper  and right middle LOBECTOMY (Right)   Subjective:  Mr. Rodney Walker complains of pain at the chest tube site.  He has not received adequate pain control.  + ambulation + BM  Objective: Vital signs in last 24 hours: Temp:  [97.7 F (36.5 C)-98.6 F (37 C)] 98.1 F (36.7 C) (04/30 0401) Pulse Rate:  [72-107] 81 (04/30 0401) Cardiac Rhythm:  [-] Normal sinus rhythm;Sinus tachycardia (04/29 2000) Resp:  [16-35] 22 (04/30 0908) BP: (102-143)/(56-70) 102/62 mmHg (04/30 0401) SpO2:  [93 %-100 %] 98 % (04/30 0908)  Intake/Output from previous day: 04/29 0701 - 04/30 0700 In: 660 [I.V.:360; IV Piggyback:300] Out: 1120 [Urine:1100; Chest Tube:20] Intake/Output this shift: Total I/O In: 1.4 [I.V.:1.4] Out: -   General appearance: alert, cooperative and no distress Heart: regular rate and rhythm Lungs: clear to auscultation bilaterally Abdomen: soft, non-tender; bowel sounds normal; no masses,  no organomegaly Wound: clean and dry  Lab Results:  Recent Labs  04/26/15 0441 04/27/15 0450  WBC 15.8* 13.9*  HGB 8.9* 8.2*  HCT 24.2* 22.5*  PLT 305 296   BMET:  Recent Labs  04/26/15 0441 04/27/15 0450  NA 132* 133*  K 3.6 3.9  CL 93* 93*  CO2 31 33*  GLUCOSE 122* 125*  BUN 7 8  CREATININE 1.05 1.04  CALCIUM 8.2* 8.4    PT/INR: No results for input(s): LABPROT, INR in the last 72 hours. ABG    Component Value Date/Time   PHART 7.452* 04/24/2015 0416   HCO3 27.6* 04/24/2015 0416   TCO2 29 04/24/2015 0416   O2SAT 96.0 04/24/2015 0416   CBG (last 3)  No results for input(s): GLUCAP in the last 72 hours.  Assessment/Plan: S/P Procedure(s) (LRB): VIDEO ASSISTED THORACOSCOPY (VATS)/RIGHT upper  and right middle LOBECTOMY (Right)  1. CV- NSR 2. Pulm- off oxygen, no acute issues, Chest tube with no  air leak 20 cc output recorded yesterday- can possibly d/c last chest tube 3. ID- cultures and pathology remain unremarkable, on IV ABX will continue for now 4. LOC constipation- lactulose prn 5. Dispo- pain control is a problem, however patient wasn't utilizing OXY IR, encouraged nursing to try this with Ultram, Lactulose prn continue current care   LOS: 11 days    BARRETT, ERIN 04/27/2015 Chest tube out today I have seen and examined [place] and agree with the above assessment  and plan.  Delight Ovens MD Beeper [phone] Office [phone] 04/27/2015 2:31 PM

## 2015-04-28 ENCOUNTER — Inpatient Hospital Stay (HOSPITAL_COMMUNITY): Payer: No Typology Code available for payment source

## 2015-04-28 LAB — ANAEROBIC CULTURE

## 2015-04-28 LAB — BODY FLUID CULTURE: Culture: NO GROWTH

## 2015-04-28 LAB — COMPREHENSIVE METABOLIC PANEL
ALT: 43 U/L (ref 17–63)
AST: 39 U/L (ref 15–41)
Albumin: 1.4 g/dL — ABNORMAL LOW (ref 3.5–5.0)
Alkaline Phosphatase: 79 U/L (ref 38–126)
Anion gap: 6 (ref 5–15)
BUN: 10 mg/dL (ref 6–20)
CO2: 32 mmol/L (ref 22–32)
Calcium: 8.3 mg/dL — ABNORMAL LOW (ref 8.9–10.3)
Chloride: 95 mmol/L — ABNORMAL LOW (ref 101–111)
Creatinine, Ser: 0.97 mg/dL (ref 0.61–1.24)
GFR calc Af Amer: >60 mL/min (ref >60)
GFR calc non Af Amer: >60 mL/min (ref >60)
Glucose, Bld: 106 mg/dL — ABNORMAL HIGH (ref 70–99)
Potassium: 3.9 mmol/L (ref 3.5–5.1)
Sodium: 133 mmol/L — ABNORMAL LOW (ref 135–145)
Total Bilirubin: 0.6 mg/dL (ref 0.3–1.2)
Total Protein: 5.5 g/dL — ABNORMAL LOW (ref 6.5–8.1)

## 2015-04-28 LAB — CBC
HCT: 21.8 % — ABNORMAL LOW (ref 39.0–52.0)
Hemoglobin: 7.9 g/dL — ABNORMAL LOW (ref 13.0–17.0)
MCH: 30.7 pg (ref 26.0–34.0)
MCHC: 36.2 g/dL — ABNORMAL HIGH (ref 30.0–36.0)
MCV: 84.8 fL (ref 78.0–100.0)
Platelets: 317 10*3/uL (ref 150–400)
RBC: 2.57 MIL/uL — ABNORMAL LOW (ref 4.22–5.81)
RDW: 13.6 % (ref 11.5–15.5)
WBC: 10.5 10*3/uL (ref 4.0–10.5)

## 2015-04-28 MED ORDER — OXYCODONE HCL 5 MG PO TABS: 5.0000 mg | ORAL_TABLET | ORAL | Status: DC | PRN

## 2015-04-28 MED ORDER — TRAMADOL HCL 50 MG PO TABS: 50.0000 mg | ORAL_TABLET | Freq: Four times a day (QID) | ORAL | Status: DC | PRN

## 2015-04-28 MED ORDER — LEVOFLOXACIN 750 MG PO TABS: 750.0000 mg | ORAL_TABLET | Freq: Every day | ORAL | Status: DC

## 2015-04-28 MED ORDER — CYCLOBENZAPRINE HCL 10 MG PO TABS: 10.0000 mg | ORAL_TABLET | Freq: Three times a day (TID) | ORAL | Status: DC | PRN

## 2015-04-28 NOTE — Progress Notes (Addendum)
      [address]       [place] [address]             [phone]      5 Days Post-Op Procedure(s) (LRB): VIDEO ASSISTED THORACOSCOPY (VATS)/RIGHT upper  and right middle LOBECTOMY (Right)   Subjective:  Rodney Walker is doing well this morning.  He is hopeful he can go home today.  + ambulation + BM  Objective: Vital signs in last 24 hours: Temp:  [98.1 F (36.7 C)-98.8 F (37.1 C)] 98.6 F (37 C) (05/01 0800) Pulse Rate:  [82-97] 82 (05/01 0353) Cardiac Rhythm:  [-] Normal sinus rhythm (05/01 0707) Resp:  [13-28] 22 (05/01 0856) BP: (104-118)/(56-67) 115/67 mmHg (05/01 0353) SpO2:  [90 %-100 %] 95 % (05/01 0856)  Intake/Output from previous day: 04/30 0701 - 05/01 0700 In: 1335.6 [I.V.:785.6; IV Piggyback:550] Out: 1000 [Urine:1000]  General appearance: alert, cooperative and no distress Heart: regular rate and rhythm Lungs: diminished breath sounds bibasilar Abdomen: soft, non-tender; bowel sounds normal; no masses,  no organomegaly Wound: clean and dry  Lab Results:  Recent Labs  04/27/15 0450 04/28/15 0408  WBC 13.9* 10.5  HGB 8.2* 7.9*  HCT 22.5* 21.8*  PLT 296 317   BMET:  Recent Labs  04/27/15 0450 04/28/15 0408  NA 133* 133*  K 3.9 3.9  CL 93* 95*  CO2 33* 32  GLUCOSE 125* 106*  BUN 8 10  CREATININE 1.04 0.97  CALCIUM 8.4 8.3*    PT/INR: No results for input(s): LABPROT, INR in the last 72 hours. ABG    Component Value Date/Time   PHART 7.452* 04/24/2015 0416   HCO3 27.6* 04/24/2015 0416   TCO2 29 04/24/2015 0416   O2SAT 96.0 04/24/2015 0416   CBG (last 3)  No results for input(s): GLUCAP in the last 72 hours.  Assessment/Plan: S/P Procedure(s) (LRB): VIDEO ASSISTED THORACOSCOPY (VATS)/RIGHT upper  and right middle LOBECTOMY (Right)  1. CV- NSR 2. Pulm- off oxygen, chest tube removed yesterday, no evidence of pneumothorax, stable appearance of fluid loculation 3. ID- all cultures remain negative, remains  afebrile, Leukocytosis resolved, will plan to d/c with Levaquin as patient has had good success with this in the past 4. Dispo- patient stable, will d/c today if okay with Dr. Tyrone Walker   LOS: 12 days    Rodney Walker 04/28/2015  Plan d/c today I have seen and examined Rodney Walker and agree with the above assessment  and plan.  Rodney Ovens MD Beeper [phone] Office [phone] 04/28/2015 12:00 PM

## 2015-04-28 NOTE — Progress Notes (Signed)
Pt discharge via wheelchair with friend at ~1510. Patient and family acknowledged understanding of discharge instructions.

## 2015-04-29 ENCOUNTER — Inpatient Hospital Stay: Payer: No Typology Code available for payment source | Admitting: Family Medicine

## 2015-05-03 ENCOUNTER — Ambulatory Visit: Payer: No Typology Code available for payment source | Attending: Family Medicine | Admitting: Family Medicine

## 2015-05-03 ENCOUNTER — Encounter: Payer: Self-pay | Admitting: Family Medicine

## 2015-05-03 VITALS — BP 108/76 | HR 127 | Temp 98.0°F | Resp 18 | Ht 64.0 in | Wt 122.0 lb

## 2015-05-03 DIAGNOSIS — R6881 Early satiety: Secondary | ICD-10-CM | POA: Insufficient documentation

## 2015-05-03 DIAGNOSIS — B20 Human immunodeficiency virus [HIV] disease: Secondary | ICD-10-CM

## 2015-05-03 DIAGNOSIS — R918 Other nonspecific abnormal finding of lung field: Secondary | ICD-10-CM

## 2015-05-03 DIAGNOSIS — J852 Abscess of lung without pneumonia: Secondary | ICD-10-CM

## 2015-05-03 DIAGNOSIS — D500 Iron deficiency anemia secondary to blood loss (chronic): Secondary | ICD-10-CM

## 2015-05-03 DIAGNOSIS — I2510 Atherosclerotic heart disease of native coronary artery without angina pectoris: Secondary | ICD-10-CM | POA: Insufficient documentation

## 2015-05-03 DIAGNOSIS — Z792 Long term (current) use of antibiotics: Secondary | ICD-10-CM | POA: Insufficient documentation

## 2015-05-03 DIAGNOSIS — Z902 Acquired absence of lung [part of]: Secondary | ICD-10-CM | POA: Insufficient documentation

## 2015-05-03 DIAGNOSIS — J851 Abscess of lung with pneumonia: Secondary | ICD-10-CM | POA: Insufficient documentation

## 2015-05-03 DIAGNOSIS — D649 Anemia, unspecified: Secondary | ICD-10-CM | POA: Insufficient documentation

## 2015-05-03 DIAGNOSIS — F1290 Cannabis use, unspecified, uncomplicated: Secondary | ICD-10-CM | POA: Insufficient documentation

## 2015-05-03 DIAGNOSIS — Z87891 Personal history of nicotine dependence: Secondary | ICD-10-CM | POA: Insufficient documentation

## 2015-05-03 DIAGNOSIS — I252 Old myocardial infarction: Secondary | ICD-10-CM | POA: Insufficient documentation

## 2015-05-03 DIAGNOSIS — Z48813 Encounter for surgical aftercare following surgery on the respiratory system: Secondary | ICD-10-CM | POA: Insufficient documentation

## 2015-05-03 DIAGNOSIS — Z7982 Long term (current) use of aspirin: Secondary | ICD-10-CM | POA: Insufficient documentation

## 2015-05-03 LAB — CBC WITH DIFFERENTIAL/PLATELET
BASOS PCT: 0 % (ref 0–1)
Basophils Absolute: 0 10*3/uL (ref 0.0–0.1)
EOS PCT: 1 % (ref 0–5)
Eosinophils Absolute: 0.1 10*3/uL (ref 0.0–0.7)
HCT: 33.4 % — ABNORMAL LOW (ref 39.0–52.0)
HEMOGLOBIN: 11.6 g/dL — AB (ref 13.0–17.0)
LYMPHS PCT: 10 % — AB (ref 12–46)
Lymphs Abs: 1 10*3/uL (ref 0.7–4.0)
MCH: 31.2 pg (ref 26.0–34.0)
MCHC: 34.7 g/dL (ref 30.0–36.0)
MCV: 89.8 fL (ref 78.0–100.0)
MONO ABS: 0.7 10*3/uL (ref 0.1–1.0)
MPV: 10 fL (ref 8.6–12.4)
Monocytes Relative: 7 % (ref 3–12)
Neutro Abs: 8.4 10*3/uL — ABNORMAL HIGH (ref 1.7–7.7)
Neutrophils Relative %: 82 % — ABNORMAL HIGH (ref 43–77)
PLATELETS: 547 10*3/uL — AB (ref 150–400)
RBC: 3.72 MIL/uL — AB (ref 4.22–5.81)
RDW: 13.8 % (ref 11.5–15.5)
WBC: 10.2 10*3/uL (ref 4.0–10.5)

## 2015-05-03 NOTE — Progress Notes (Signed)
Subjective:    Patient ID: Rodney Walker, male    DOB: Jul 29, 1981, 35 y.o.   MRN: [id number]  HPI   Admit date: 04/16/15  Discharge date : 04/28/15     Rodney Walker is a 34 year old male with a history of HIV/AIDS and a right upper lung mass status post lung biopsy who developed fevers of up to 102  and was unable to afford the doxycycline which he was prescribed as an outpatient.    He had a CT showed an increasing right upper lung mass-like opacity, scattered bilateral pulmonary nodules, and possible post op pneumonititis. He was referred for admission for pneumonia in an HIV positive patient.He was placed on IV Vancomycin and Zosyn which was switched to Ceftriaxone by infectious disease. CT surgery was consulted due to worsening sepsis and right upper lobe abscess which was increasing in size.   He underwent a Right video-assisted thoracic surgery, right thoracotomy with right upper lobectomy and right middle lobectomy and placement of wound On-Q pain analgesia system by Dr. Donata Clay on 04/23/2015. Pathology was negative for tumor but positive for granulomatous inflammation.  Post-op course was completed by air leak which was stopped and he eventually had removal of chest tubes; he was placed on 2L oxygen and was weaned to room air, placed on Levaquin prior to discharge.    Interval History: States he "catches breathing attacks and has to slow his breathing to get air". Denies  Fevers, myalgias and states he has early satiety which he has had for a while and remains on ensure.  He has an upcoming appointment with CT surgery and Infectious Disease.  Past Medical History  Diagnosis Date  . Chest pain 2010  . Hyperlipidemia   . Coronary artery disease     SPONTANEOUS DISSECTION AND PLAQUE RUPTURE OF HIS LEFT ANTERIOR DESCENDING ARTERY  . MI (myocardial infarction)   . HIV infection   . Empyema lung   . Emphysema lung     Past Surgical History  Procedure  Laterality Date  . Cardiac catheterization  06/24/2009    EF 60%  . Cardiac catheterization  06/17/2009    EF 45%. ANTERIOR HYPOKINESIS  . US echocardiography  12/09/2009    EF 55-60%  . Video bronchoscopy Bilateral 09/05/2014    Procedure: VIDEO BRONCHOSCOPY WITH FLUORO;  Surgeon: Merwyn Katos, MD;  Location: [facility] ENDOSCOPY;  Service: Cardiopulmonary;  Laterality: Bilateral;  . Video bronchoscopy Bilateral 04/10/2015    Procedure: VIDEO BRONCHOSCOPY WITH FLUORO;  Surgeon: Lupita Leash, MD;  Location[facility] ENDOSCOPY;  Service: Cardiopulmonary;  Laterality: Bilateral;  . Video assisted thoracoscopy (vats)/ lobectomy Right 04/23/2015    Procedure: VIDEO ASSISTED THORACOSCOPY (VATS)/RIGHT upper  and right middle LOBECTOMY;  Surgeon: Kerin Perna, MD;  Location: [facility] OR;  Service: Thoracic;  Laterality: Right;    Family History  Problem Relation Age of Onset  . Diabetes Mother   . Liver cancer Father     History   Social History  . Marital Status: Single    Spouse Name: N/A  . Number of Children: N/A  . Years of Education: N/A   Occupational History  . unemployed    Social History Main Topics  . Smoking status: Former Smoker -- 0.25 packs/day for 17 years    Types: Cigarettes    Quit date: 03/22/2014  . Smokeless tobacco: Never Used     Comment: has cut back significantly, 3 cigs/day.  03/19/15  . Alcohol Use: No  .  Drug Use: 4.00 per week    Special: Marijuana     Comment: postive for marijuana. Negative for cocaine  . Sexual Activity:    Partners: Male     Comment: given condoms   Other Topics Concern  . Not on file   Social History Narrative    No Known Allergies  Current Outpatient Prescriptions on File Prior to Visit  Medication Sig Dispense Refill  . albuterol (PROVENTIL HFA;VENTOLIN HFA) 108 (90 BASE) MCG/ACT inhaler Inhale 2 puffs into the lungs every 6 (six) hours as needed for wheezing or shortness of breath. 1 Inhaler 1  . aspirin 325 MG tablet Take 650  mg by mouth daily as needed for fever.    . cyclobenzaprine (FLEXERIL) 10 MG tablet Take 1 tablet (10 mg total) by mouth 3 (three) times daily as needed for muscle spasms. 20 tablet 0  . dolutegravir (TIVICAY) 50 MG tablet Take 1 tablet (50 mg total) by mouth daily. 30 tablet 11  . emtricitabine-tenofovir (TRUVADA) 200-300 MG per tablet Take 1 tablet by mouth daily. 30 tablet 5  . ibuprofen (ADVIL,MOTRIN) 200 MG tablet Take 200 mg by mouth every 6 (six) hours as needed (pain).    Marland Kitchen levofloxacin (LEVAQUIN) 750 MG tablet Take 1 tablet (750 mg total) by mouth daily. 30 tablet 0  . oxyCODONE (OXY IR/ROXICODONE) 5 MG immediate release tablet Take 1-2 tablets (5-10 mg total) by mouth every 4 (four) hours as needed for severe pain. 30 tablet 0  . traMADol (ULTRAM) 50 MG tablet Take 1-2 tablets (50-100 mg total) by mouth every 6 (six) hours as needed (mild pain). 30 tablet 0  . TRUVADA 200-300 MG per tablet TAKE 1 TABLET BY MOUTH DAILY 30 tablet 0   No current facility-administered medications on file prior to visit.     Review of Systems  Constitutional: Negative for activity change and appetite change.  HENT: Negative for sinus pressure and sore throat.   Eyes: Negative for visual disturbance.  Respiratory:       See HPI  Cardiovascular: Negative for chest pain and palpitations.  Gastrointestinal: Negative for abdominal pain and abdominal distention.  Endocrine: Negative for cold intolerance, heat intolerance and polyphagia.  Genitourinary: Negative for dysuria, frequency and difficulty urinating.  Musculoskeletal: Negative for back pain, joint swelling and arthralgias.  Skin: Negative for color change.  Neurological: Negative for dizziness, tremors and weakness.  Psychiatric/Behavioral: Negative for suicidal ideas and behavioral problems.         Objective: Filed Vitals:   05/03/15 1055  BP: 108/76  Pulse: 127  Temp: 98 F (36.7 C)  Resp: 18      Physical Exam  Constitutional:  He is oriented to person, place, and time. He appears well-developed and well-nourished.  HENT:  Head: Normocephalic and atraumatic.  Right Ear: External ear normal.  Left Ear: External ear normal.  Eyes: Conjunctivae and EOM are normal. Pupils are equal, round, and reactive to light.  Neck: Normal range of motion. Neck supple. No tracheal deviation present.  Cardiovascular: Regular rhythm and normal heart sounds.  Tachycardia present.   No murmur heard. Pulmonary/Chest: Effort normal and breath sounds normal. No respiratory distress. He has no wheezes.  Right side of chest wall with sutures in place over lobectomy scar and at port from tube placement, no evidence of infection.  Abdominal: Soft. Bowel sounds are normal. He exhibits no mass. There is no tenderness.  Musculoskeletal: Normal range of motion. He exhibits no edema or tenderness.  Neurological: He is alert and oriented to person, place, and time.  Skin: Skin is warm and dry.  Psychiatric: He has a normal mood and affect.            Assessment & Plan:   34 year old male patient with a history of HIV/AIDS CD4 count of 29 , recently managed for a right lung mass status post right upper and middle lobectomy here for follow-up.     Right lung mass status post right upper and middle lobectomy.  Surgical wound is healing well the patient is still mildly symptomatic which had with attributed to deconditioning.  Advised to keep his appointment with the cardiothoracic surgeon.    HIV/AIDS :  CD4 count of 29  Advised to keep his appointment with infectious disease.  Anemia :   likely secondary to blood loss I will repeat a CBC today.

## 2015-05-03 NOTE — Patient Instructions (Signed)
Anemia, Nonspecific Anemia is a condition in which the concentration of red blood cells or hemoglobin in the blood is below normal. Hemoglobin is a substance in red blood cells that carries oxygen to the tissues of the body. Anemia results in not enough oxygen reaching these tissues.  CAUSES  Common causes of anemia include:   Excessive bleeding. Bleeding may be internal or external. This includes excessive bleeding from periods (in women) or from the intestine.   Poor nutrition.   Chronic kidney, thyroid, and liver disease.  Bone marrow disorders that decrease red blood cell production.  Cancer and treatments for cancer.  HIV, AIDS, and their treatments.  Spleen problems that increase red blood cell destruction.  Blood disorders.  Excess destruction of red blood cells due to infection, medicines, and autoimmune disorders. SIGNS AND SYMPTOMS   Minor weakness.   Dizziness.   Headache.  Palpitations.   Shortness of breath, especially with exercise.   Paleness.  Cold sensitivity.  Indigestion.  Nausea.  Difficulty sleeping.  Difficulty concentrating. Symptoms may occur suddenly or they may develop slowly.  DIAGNOSIS  Additional blood tests are often needed. These help your health care provider determine the best treatment. Your health care provider will check your stool for blood and look for other causes of blood loss.  TREATMENT  Treatment varies depending on the cause of the anemia. Treatment can include:   Supplements of iron, vitamin B12, or folic acid.   Hormone medicines.   A blood transfusion. This may be needed if blood loss is severe.   Hospitalization. This may be needed if there is significant continual blood loss.   Dietary changes.  Spleen removal. HOME CARE INSTRUCTIONS Keep all follow-up appointments. It often takes many weeks to correct anemia, and having your health care provider check on your condition and your response to  treatment is very important. SEEK IMMEDIATE MEDICAL CARE IF:   You develop extreme weakness, shortness of breath, or chest pain.   You become dizzy or have trouble concentrating.  You develop heavy vaginal bleeding.   You develop a rash.   You have bloody or black, tarry stools.   You faint.   You vomit up blood.   You vomit repeatedly.   You have abdominal pain.  You have a fever or persistent symptoms for more than 2-3 days.   You have a fever and your symptoms suddenly get worse.   You are dehydrated.  MAKE SURE YOU:  Understand these instructions.  Will watch your condition.  Will get help right away if you are not doing well or get worse. Document Released: 01/21/2005 Document Revised: 08/16/2013 Document Reviewed: 06/09/2013 ExitCare Patient Information 2015 [employer]. This information is not intended to replace advice given to you by your health care provider. Make sure you discuss any questions you have with your health care provider.  

## 2015-05-03 NOTE — Progress Notes (Signed)
Patient hospitalized for lung mass, had lobectomy. Patient feels better, "the pain is not as excruciating as it would be however I do have these breathing attacks that come up on me". Breathing attack is like a panic attack per patient. Patient reports discomfort in right chest and upper back, at level 5. Patient sweats during night and has crazy dreams if he takes oxycodone.

## 2015-05-06 ENCOUNTER — Telehealth: Payer: Self-pay

## 2015-05-06 NOTE — Telephone Encounter (Signed)
-----   Message from Jaclyn Shaggy, MD sent at 05/05/2015 11:37 PM EDT ----- Please inform him his anemia has improved.

## 2015-05-06 NOTE — Telephone Encounter (Signed)
Nurse called patient, patient verified date of birth. Patient aware of improved anemia. Patient voices understanding.

## 2015-05-07 ENCOUNTER — Other Ambulatory Visit: Payer: Self-pay | Admitting: *Deleted

## 2015-05-07 ENCOUNTER — Ambulatory Visit (INDEPENDENT_AMBULATORY_CARE_PROVIDER_SITE_OTHER): Payer: Self-pay

## 2015-05-07 ENCOUNTER — Other Ambulatory Visit: Payer: Self-pay | Admitting: Cardiothoracic Surgery

## 2015-05-07 ENCOUNTER — Other Ambulatory Visit: Payer: Self-pay | Admitting: Infectious Disease

## 2015-05-07 DIAGNOSIS — G8918 Other acute postprocedural pain: Secondary | ICD-10-CM

## 2015-05-07 DIAGNOSIS — J869 Pyothorax without fistula: Secondary | ICD-10-CM

## 2015-05-07 DIAGNOSIS — R918 Other nonspecific abnormal finding of lung field: Secondary | ICD-10-CM

## 2015-05-07 DIAGNOSIS — B20 Human immunodeficiency virus [HIV] disease: Secondary | ICD-10-CM

## 2015-05-07 LAB — FUNGUS CULTURE W SMEAR: FUNGAL SMEAR: NONE SEEN

## 2015-05-07 MED ORDER — TRAMADOL HCL 50 MG PO TABS: 50.0000 mg | ORAL_TABLET | Freq: Four times a day (QID) | ORAL | Status: DC | PRN

## 2015-05-07 MED ORDER — OXYCODONE HCL 5 MG PO TABS
5.0000 mg | ORAL_TABLET | ORAL | Status: DC | PRN
Start: 2015-05-07 — End: 2015-05-13

## 2015-05-09 ENCOUNTER — Ambulatory Visit: Payer: No Typology Code available for payment source | Attending: Physician Assistant | Admitting: Physician Assistant

## 2015-05-09 VITALS — BP 130/77 | HR 109 | Temp 98.1°F | Wt 129.4 lb

## 2015-05-09 DIAGNOSIS — R202 Paresthesia of skin: Secondary | ICD-10-CM

## 2015-05-09 MED ORDER — GABAPENTIN 100 MG PO CAPS: 100.0000 mg | ORAL_CAPSULE | Freq: Three times a day (TID) | ORAL | Status: DC

## 2015-05-09 NOTE — Progress Notes (Signed)
Chief Complaint: "Several weeks of tingling in fingers"  Subjective: 34 year old male presenting for tingling in his fingertips. His been going on for several weeks. His random. It is happening more often. He describes feeling like his fingertips fall asleep. No chest pain. No lightheadedness. No syncope. No headaches. No dizziness.   ROS:  GEN: denies fever or chills, denies change in weight Skin: denies lesions or rashes HEENT: denies headache, earache, epistaxis, sore throat, or neck pain LUNGS: denies SHOB, dyspnea, PND, orthopnea CV: denies CP or palpitations NEURO: denies numbness or tingling, denies sz, stroke or TIA   Objective:  Filed Vitals:   05/09/15 1045  BP: 130/77  Pulse: 109  Temp: 98.1 F (36.7 C)  Weight: 129 lb 6.4 oz (58.695 kg)  SpO2: 97%    Physical Exam: General: in no acute distress. HEENT: no pallor, no icterus, moist oral mucosa, no JVD, no lymphadenopathy Heart: Normal  s1 &s2  Regular rate and rhythm, without murmurs, rubs, gallops. Lungs: Clear to auscultation bilaterally. Extremities: No clubbing cyanosis or edema with positive pedal pulses. Cool fingertips.  Neuro: Alert, awake, oriented x3, nonfocal.  Pertinent Lab Results:none   Medications: Prior to Admission medications   Medication Sig Start Date End Date Taking? Authorizing Provider  albuterol (PROVENTIL HFA;VENTOLIN HFA) 108 (90 BASE) MCG/ACT inhaler Inhale 2 puffs into the lungs every 6 (six) hours as needed for wheezing or shortness of breath. 07/25/14  Yes Randall Hiss, MD  aspirin 325 MG tablet Take 650 mg by mouth daily as needed for fever.   Yes Historical Provider, MD  cyclobenzaprine (FLEXERIL) 10 MG tablet Take 1 tablet (10 mg total) by mouth 3 (three) times daily as needed for muscle spasms. 04/28/15  Yes Erin R Barrett, PA-C  dolutegravir (TIVICAY) 50 MG tablet Take 1 tablet (50 mg total) by mouth daily. 09/04/14  Yes Gardiner Barefoot, MD  ibuprofen (ADVIL,MOTRIN) 200 MG  tablet Take 200 mg by mouth every 6 (six) hours as needed (pain).   Yes Historical Provider, MD  levofloxacin (LEVAQUIN) 750 MG tablet Take 1 tablet (750 mg total) by mouth daily. 04/28/15  Yes Erin R Barrett, PA-C  oxyCODONE (OXY IR/ROXICODONE) 5 MG immediate release tablet Take 1-2 tablets (5-10 mg total) by mouth every 4 (four) hours as needed for severe pain. 05/07/15  Yes Alleen Borne, MD  traMADol (ULTRAM) 50 MG tablet Take 1-2 tablets (50-100 mg total) by mouth every 6 (six) hours as needed (mild pain). 05/07/15  Yes Alleen Borne, MD  TRUVADA 200-300 MG per tablet TAKE 1 TABLET BY MOUTH DAILY 05/07/15  Yes Judyann Munson, MD  gabapentin (NEURONTIN) 100 MG capsule Take 1 capsule (100 mg total) by mouth 3 (three) times daily. 05/09/15   Vivianne Master, PA-C    Assessment: 1. Tingling in Fingers Bilateral Hands-neuropathy vs medication induced  Plan: Neurontin 100 mg TID  Follow up:4 weeks  The patient was given clear instructions to go to ER or return to medical center if symptoms don't improve, worsen or new problems develop. The patient verbalized understanding. The patient was told to call to get lab results if they haven't heard anything in the next week.   This note has been created with Education officer, environmental. Any transcriptional errors are unintentional.   Scot Jun, PA-C 05/09/2015, 11:06 AM

## 2015-05-13 ENCOUNTER — Ambulatory Visit
Admission: RE | Admit: 2015-05-13 | Discharge: 2015-05-13 | Disposition: A | Discharge status: Home / Self Care | Payer: No Typology Code available for payment source | Source: Ambulatory Visit | Attending: Cardiothoracic Surgery | Admitting: Cardiothoracic Surgery

## 2015-05-13 ENCOUNTER — Ambulatory Visit (INDEPENDENT_AMBULATORY_CARE_PROVIDER_SITE_OTHER): Payer: Self-pay | Admitting: Surgical

## 2015-05-13 VITALS — BP 129/78 | HR 92 | Temp 98.2°F | Resp 19 | Ht 64.0 in | Wt 129.0 lb

## 2015-05-13 DIAGNOSIS — J869 Pyothorax without fistula: Secondary | ICD-10-CM

## 2015-05-13 DIAGNOSIS — G8918 Other acute postprocedural pain: Secondary | ICD-10-CM

## 2015-05-13 DIAGNOSIS — R918 Other nonspecific abnormal finding of lung field: Secondary | ICD-10-CM

## 2015-05-13 DIAGNOSIS — Z9889 Other specified postprocedural states: Secondary | ICD-10-CM

## 2015-05-13 DIAGNOSIS — Z902 Acquired absence of lung [part of]: Secondary | ICD-10-CM

## 2015-05-13 MED ORDER — OXYCODONE HCL 5 MG PO TABS: 5.0000 mg | ORAL_TABLET | Freq: Four times a day (QID) | ORAL | Status: DC | PRN

## 2015-05-13 NOTE — Progress Notes (Signed)
[address]       [place] [address]             [phone]                  [facility] Medical Record #[id number] Date of Birth: 1981/09/28  Referring OZ:[id number], Olegario Messier, MD Primary Cardiology: Primary Care:No PCP Per Patient  Chief Complaint:  Follow Up Visit DATE OF PROCEDURE: 04/23/2015 DATE OF DISCHARGE:   OPERATIVE REPORT   OPERATION: 1. Right video-assisted thoracic surgery, right thoracotomy with right  upper lobectomy and right middle lobectomy. 2. Placement of wound On-Q pain analgesia system.  SURGEON: Kerin Perna, M.D.  ASSISTANT: Coral Ceo, PA-C.  ANESTHESIA: General.  PREOPERATIVE DIAGNOSIS: Right upper lobe lung abscess with inflammation and involvement of the right middle lobe.  POSTOPERATIVE DIAGNOSIS: Right upper lobe lung abscess with inflammation and involvement of the right middle lobe.  History of Present Illness:    This is a 34 year old male status post above mentioned procedure who is seen on today's date in routine office visit follow-up. The patient currently complains of some numbness associated with the thoracotomy primarily in the anterior pectoralis region on the right side. He also continues to have some difficulty with incisional pain and is using narcotics on a fairly regular basis. He has had no specific difficulties with the incision and denies any drainage. He denies fevers, chills or other constitutional symptoms except he has an occasional cough where he describes some old blood in the sputum. He continues to be on oral Levaquin 750 mg daily.         Zubrod Score: At the time of surgery this patient's most appropriate activity status/level should be described as:     0    Normal activity, no symptoms     1    Restricted in physical strenuous activity but ambulatory, able to do out light work     2    Ambulatory and  capable of self care, unable to do work activities, up and about                 >50 % of waking hours                                                                                       3    Only limited self care, in bed greater than 50% of waking hours     4    Completely disabled, no self care, confined to bed or chair     5    Moribund  History  Smoking status  . Former Smoker -- 0.25 packs/day for 17 years  . Types: Cigarettes  . Quit date: 03/22/2014  Smokeless tobacco  . Never Used    Comment: has cut back significantly, 3 cigs/day.  03/19/15       No Known Allergies  Current Outpatient Prescriptions  Medication Sig Dispense Refill  . albuterol (PROVENTIL HFA;VENTOLIN HFA) 108 (90 BASE) MCG/ACT inhaler Inhale 2 puffs into the lungs every 6 (six) hours as needed for wheezing or shortness of  breath. 1 Inhaler 1  . aspirin 325 MG tablet Take 650 mg by mouth daily as needed for fever.    . cyclobenzaprine (FLEXERIL) 10 MG tablet Take 1 tablet (10 mg total) by mouth 3 (three) times daily as needed for muscle spasms. 20 tablet 0  . dolutegravir (TIVICAY) 50 MG tablet Take 1 tablet (50 mg total) by mouth daily. 30 tablet 11  . gabapentin (NEURONTIN) 100 MG capsule Take 1 capsule (100 mg total) by mouth 3 (three) times daily. 90 capsule 3  . ibuprofen (ADVIL,MOTRIN) 200 MG tablet Take 200 mg by mouth every 6 (six) hours as needed (pain).    Marland Kitchen levofloxacin (LEVAQUIN) 750 MG tablet Take 1 tablet (750 mg total) by mouth daily. 30 tablet 0  . oxyCODONE (OXY IR/ROXICODONE) 5 MG immediate release tablet Take 1-2 tablets (5-10 mg total) by mouth every 6 (six) hours as needed for severe pain. 30 tablet 0  . traMADol (ULTRAM) 50 MG tablet Take 1-2 tablets (50-100 mg total) by mouth every 6 (six) hours as needed (mild pain). 40 tablet 0  . TRUVADA 200-300 MG per tablet TAKE 1 TABLET BY MOUTH DAILY 30 tablet 6   No current facility-administered medications for this visit.        Physical Exam: BP 129/78 mmHg  Pulse 92  Temp(Src) 98.2 F (36.8 C)  Resp 19  Ht  (1.626 m)  Wt 129 lb (58.514 kg)  BMI 22.13 kg/m2  SpO2 98%  General appearance: alert, cooperative and no distress Heart: regular rate and rhythm Lungs: clear to auscultation bilaterally Abdomen: benign exam Extremities: no edema Wound: incis healing well without signs of infection Other: + right pre-auricular lymph node swelling  Diagnostic Studies & Laboratory data:         Recent Radiology Findings: Dg Chest 2 View  05/13/2015   CLINICAL DATA:  Empyema status post VATS.  Chest pain.  EXAM: CHEST  2 VIEW  COMPARISON:  04/28/2015  FINDINGS: Prior right lobectomy. Persistent right upper lobe airspace disease. No other focal parenchymal opacity. No pleural effusion or pneumothorax. Right lung volume loss. Normal heart size. Right fifth rib deformity secondary to recent VATS procedure.  IMPRESSION: Persistent right upper lobe airspace disease concerning for pneumonia.   Electronically Signed   By: Elige Ko   On: 05/13/2015 12:53      I have independently reviewed the above radiology findings and reviewed findings  with the patient.  Recent Labs: Lab Results  Component Value Date   WBC 10.2 05/03/2015   HGB 11.6* 05/03/2015   HCT 33.4* 05/03/2015   PLT 547* 05/03/2015   GLUCOSE 106* 04/28/2015   CHOL  06/16/2009    96        ATP III CLASSIFICATION:  <200     mg/dL   Desirable  161-096  mg/dL   Borderline High  >=045    mg/dL   High          TRIG 47 06/16/2009   HDL 24* 06/16/2009   LDLCALC  06/16/2009    63        Total Cholesterol/HDL:CHD Risk Coronary Heart Disease Risk Table                     Men   Women  1/2 Average Risk   3.4   3.3  Average Risk       5.0   4.4  2 X Average Risk   9.6   7.1  3 X Average Risk  23.4   11.0        Use the calculated Patient Ratio above and the CHD Risk Table to determine the patient's CHD Risk.        ATP III  CLASSIFICATION (LDL):  <100     mg/dL   Optimal  009-381  mg/dL   Near or Above                    Optimal  130-159  mg/dL   Borderline  829-937  mg/dL   High  >169     mg/dL   Very High   ALT 43 67/89/3810   AST 39 04/28/2015   NA 133* 04/28/2015   K 3.9 04/28/2015   CL 95* 04/28/2015   CREATININE 0.97 04/28/2015   BUN 10 04/28/2015   CO2 32 04/28/2015   INR 1.36 04/22/2015      Assessment / Plan:  The patient continues a good clinical recovery. He does seem to have a bit more pain than expected. I didn't really knew his OxyContin prescription for 1-2 every 6 hours when necessary #30. I did encourage him to try should decrease usage over time. Additionally, I discussed the neuropathic symptoms related to the thoracotomy incision and told him this is a very frequent finding and usually does recover over time. He does have an apparent right preauricular lymph node with adenopathy for approximately 2 weeks. I told him if it remains swollen their main be a need to consider biopsy. He is also noted to have a scheduled appointment with Dr. Ilsa Iha of infectious disease soon, although he was uncertain of the exact date. The chest x-ray findings appear to be either about the same as previous several films or slightly improved. As he is not having any fevers or other clinical manifestations of pneumonia I feel safe with close follow-up. We will see him in the office in 2 weeks with a repeat chest x-ray as well as.         Ladavion Savitz E 05/13/2015 1:22 PM

## 2015-05-13 NOTE — Patient Instructions (Signed)
Discussed ongoing advancement of routine activities. Also reinforced instructions regarding monitoring incisions for infection.

## 2015-05-20 LAB — FUNGUS CULTURE W SMEAR
Fungal Smear: NONE SEEN
Fungal Smear: NONE SEEN

## 2015-05-21 ENCOUNTER — Encounter: Payer: Self-pay | Admitting: Internal Medicine

## 2015-05-21 ENCOUNTER — Ambulatory Visit (INDEPENDENT_AMBULATORY_CARE_PROVIDER_SITE_OTHER): Payer: No Typology Code available for payment source | Admitting: Internal Medicine

## 2015-05-21 ENCOUNTER — Ambulatory Visit (INDEPENDENT_AMBULATORY_CARE_PROVIDER_SITE_OTHER): Payer: No Typology Code available for payment source | Admitting: Pulmonary Disease

## 2015-05-21 ENCOUNTER — Encounter: Payer: Self-pay | Admitting: Pulmonary Disease

## 2015-05-21 VITALS — BP 115/71 | HR 65 | Temp 98.8°F | Wt 136.0 lb

## 2015-05-21 VITALS — BP 134/64 | HR 92 | Ht 64.0 in | Wt 134.0 lb

## 2015-05-21 DIAGNOSIS — J439 Emphysema, unspecified: Secondary | ICD-10-CM

## 2015-05-21 DIAGNOSIS — R918 Other nonspecific abnormal finding of lung field: Secondary | ICD-10-CM

## 2015-05-21 DIAGNOSIS — J984 Other disorders of lung: Secondary | ICD-10-CM

## 2015-05-21 DIAGNOSIS — B20 Human immunodeficiency virus [HIV] disease: Secondary | ICD-10-CM

## 2015-05-21 DIAGNOSIS — J8410 Pulmonary fibrosis, unspecified: Secondary | ICD-10-CM

## 2015-05-21 LAB — COMPLETE METABOLIC PANEL WITH GFR
ALT: 20 U/L (ref 0–53)
AST: 18 U/L (ref 0–37)
Albumin: 3.7 g/dL (ref 3.5–5.2)
Alkaline Phosphatase: 115 U/L (ref 39–117)
BILIRUBIN TOTAL: 0.2 mg/dL (ref 0.2–1.2)
BUN: 17 mg/dL (ref 6–23)
CALCIUM: 9.3 mg/dL (ref 8.4–10.5)
CHLORIDE: 102 meq/L (ref 96–112)
CO2: 26 mEq/L (ref 19–32)
Creat: 1.05 mg/dL (ref 0.50–1.35)
GFR, Est African American: >89 mL/min
GFR, Est Non African American: >89 mL/min
GLUCOSE: 54 mg/dL — AB (ref 70–99)
Potassium: 4.3 mEq/L (ref 3.5–5.3)
SODIUM: 138 meq/L (ref 135–145)
Total Protein: 7.1 g/dL (ref 6.0–8.3)

## 2015-05-21 LAB — CBC WITH DIFFERENTIAL/PLATELET
Basophils Absolute: 0 10*3/uL (ref 0.0–0.1)
Basophils Relative: 0 % (ref 0–1)
EOS ABS: 0.2 10*3/uL (ref 0.0–0.7)
EOS PCT: 3 % (ref 0–5)
HEMATOCRIT: 36.6 % — AB (ref 39.0–52.0)
Hemoglobin: 12.3 g/dL — ABNORMAL LOW (ref 13.0–17.0)
Lymphocytes Relative: 17 % (ref 12–46)
Lymphs Abs: 1.3 10*3/uL (ref 0.7–4.0)
MCH: 31 pg (ref 26.0–34.0)
MCHC: 33.6 g/dL (ref 30.0–36.0)
MCV: 92.2 fL (ref 78.0–100.0)
MONO ABS: 0.6 10*3/uL (ref 0.1–1.0)
MONOS PCT: 8 % (ref 3–12)
MPV: 9.4 fL (ref 8.6–12.4)
Neutro Abs: 5.6 10*3/uL (ref 1.7–7.7)
Neutrophils Relative %: 72 % (ref 43–77)
PLATELETS: 396 10*3/uL (ref 150–400)
RBC: 3.97 MIL/uL — ABNORMAL LOW (ref 4.22–5.81)
RDW: 15.6 % — AB (ref 11.5–15.5)
WBC: 7.8 10*3/uL (ref 4.0–10.5)

## 2015-05-21 NOTE — Patient Instructions (Signed)
We will get a repeat chest x-ray in 6 months and see you after that If you have problems with fevers, weight loss, chills, chest pain, or worsening shortness of breath and please come back and see me sooner

## 2015-05-21 NOTE — Assessment & Plan Note (Signed)
Rodney Walker had a VATS biopsy and debridement of the right upper lobe lesion in April. Pathology reports from that study showed necrotizing granulomatous inflammation in the lung mass as well as in mediastinal lymph nodes which were biopsied. Special stains and cultures have all been negative. Despite 3 separate special stains which were negative for organisms the fact that he had necrotizing granulomas and the appearance of the lesion and his HIV status makes me feel that this was most likely due to a an atypical mycobacterial disease. However, I agree that sarcoidosis is in the differential but this would be an unusual presentation and the pathology was not classic for that condition. I would prefer to not treat him for sarcoid on an empiric basis at this time because of the risk of immunosuppression with prednisone.  Plan: Follow-up in 6 months with a repeat chest x-ray, he was advised to come back sooner should he have symptoms of pneumonia or bronchitis which were detailed today for him in clinic

## 2015-05-21 NOTE — Progress Notes (Signed)
Patient ID: Rodney [facility], male   DOB: [birth year], 34 y.o.   MRN: [id number]       Patient ID: Rodney [facility], male   DOB: 1981/05/26, 34 y.o.   MRN: [id number]  HPI 34yo M with HIV disease, well controlled, hx of emphysema, noted to have right upper lobe lesion in Sept 2015, He underwent bronch and biopsy at that time which only showed granulomatous inflammation from bronch brushings. BAL negative for AFB cx. He was treated for presumed lung abscess and watched conservatively. He had repeat chest CT in April that now showed increase in size of lesion 4.8 x 4.6 cm worrisome for malignancy/atypical infection. He underwent RUL/RML resection via VATS on 04/23/15 by Dr. Donata Clay and treated again for lung abscess. the path on tissue showed necrotizing granulomas. afb negative on stain, and no growth on pleural fluid (x 2 in April 13th as well as April 26th). Unfortunately, it does not appear that tissue sent was sent for AFB culture.  He states that he is recovering well from recent resection. He denies any pleuretic chest pain. His incision sites have healed well. He is able to start going back to singing in a choir, though still does not have full stamina. No cough, weight loss, or fever. His leukocytosis and thrombocytosis have improved. He still remains on levofloxacin    Imaging: CT 03/2015.  Minimally enlarged RIGHT peritracheal lymph node 11 mm short axis image 14 little changed.  Multiple additional normal size mediastinal lymph nodes identified.  Visualized portion of upper abdomen unremarkable.  Severe bullous disease RIGHT apex with scattered emphysematous changes in both upper lobes.  Persistent masslike opacity in RIGHT upper lobe 4.8 x 4.6 cm image 21 previously 3.5 x 2.5 cm.  Several small adjacent nodular foci are identified with additional postobstructive pneumonitis in the RIGHT upper lobe.  Stable small nonspecific nodular foci in both  lungs.  Dependent atelectasis in the lower lobes bilaterally.  No pulmonary infiltrate, pleural effusion or pneumothorax.  Osseous structures unremarkable.  IMPRESSION: Increase in size of mass-like opacity in RIGHT upper lobe now measuring 4.8 x 4.6 cm, worrisome for malignancy.  Postobstructive pneumonitis RIGHT upper lobe.  Severe emphysematous changes with bullous disease particularly at RIGHT apex.  Scattered BILATERAL pulmonary nodules, little changed since 03/28/2015; recommend assessment on follow-up imaging.  Outpatient Encounter Prescriptions as of 05/21/2015  Medication Sig  . albuterol (PROVENTIL HFA;VENTOLIN HFA) 108 (90 BASE) MCG/ACT inhaler Inhale 2 puffs into the lungs every 6 (six) hours as needed for wheezing or shortness of breath.  Marland Kitchen aspirin 325 MG tablet Take 650 mg by mouth daily as needed for fever.  . cyclobenzaprine (FLEXERIL) 10 MG tablet Take 1 tablet (10 mg total) by mouth 3 (three) times daily as needed for muscle spasms.  . dolutegravir (TIVICAY) 50 MG tablet Take 1 tablet (50 mg total) by mouth daily.  Marland Kitchen gabapentin (NEURONTIN) 100 MG capsule Take 1 capsule (100 mg total) by mouth 3 (three) times daily.  Marland Kitchen ibuprofen (ADVIL,MOTRIN) 200 MG tablet Take 200 mg by mouth every 6 (six) hours as needed (pain).  Marland Kitchen levofloxacin (LEVAQUIN) 750 MG tablet Take 1 tablet (750 mg total) by mouth daily.  Marland Kitchen oxyCODONE (OXY IR/ROXICODONE) 5 MG immediate release tablet Take 1-2 tablets (5-10 mg total) by mouth every 6 (six) hours as needed for severe pain.  . traMADol (ULTRAM) 50 MG tablet Take 1-2 tablets (50-100 mg total) by mouth every 6 (six) hours as needed (mild pain).  Marland Kitchen  TRUVADA 200-300 MG per tablet TAKE 1 TABLET BY MOUTH DAILY   No facility-administered encounter medications on file as of 05/21/2015.     Patient Active Problem List   Diagnosis Date Noted  . Anemia 05/03/2015  . Lung abscess 04/22/2015  . Sepsis due to pneumonia 04/17/2015  . Sepsis  04/17/2015  . Bullous emphysema   . Lobar pneumonia   . Hyperkalemia 04/16/2015  . Empyema lung 04/16/2015  . PNA (pneumonia) 04/16/2015  . HIV (human immunodeficiency virus infection)   . Tobacco abuse 03/21/2015  . Lung mass 08/31/2014  . 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 Myalgia 04/23/2014  . Pharyngitis 03/31/2014  . Hyponatremia 03/31/2014  . Fever 03/30/2014  . AIDS 03/22/2014  . Thrush 03/22/2014  . CAP (community acquired pneumonia) 03/22/2014  . CAD (coronary artery disease) 11/13/2011     Health Maintenance Due  Topic Date Due  . TETANUS/TDAP  10/18/1999     Review of Systems + shortness of breath, 10 point ros reveiwed, + pertinents listed in hpi Physical Exam   BP 115/71 mmHg  Pulse 65  Temp(Src) 98.8 F (37.1 C) (Oral)  Wt 136 lb (61.689 kg) Physical Exam  Constitutional: He is oriented to person, place, and time. He appears well-developed and well-nourished. No distress.  HENT:  Mouth/Throat: Oropharynx is clear and moist. No oropharyngeal exudate.  Cardiovascular: Normal rate, regular rhythm and normal heart sounds. Exam reveals no gallop and no friction rub.  No murmur heard.  Pulmonary/Chest: Effort normal and breath sounds normal. No respiratory distress. He has no wheezes. Decrease breath sounds on right mid lung fields. Incision sites well healed Abdominal: Soft. Bowel sounds are normal. He exhibits no distension. There is no tenderness.  Lymphadenopathy:  He has no cervical adenopathy.  Neurological: He is alert and oriented to person, place, and time.  Skin: Skin is warm and dry. No open incisions Psychiatric: He has a normal mood and affect. His behavior is normal.    Lab Results  Component Value Date   CD4TCELL 29* 03/14/2015   Lab Results  Component Value Date   CD4TABS 320* 03/14/2015   CD4TABS 280* 12/13/2014   CD4TABS 300* 08/31/2014   Lab Results  Component Value Date   HIV1RNAQUANT 81* 03/14/2015   No results found  for: HEPBSAB No results found for: RPR  CBC Lab Results  Component Value Date   WBC 10.2 05/03/2015   RBC 3.72* 05/03/2015   HGB 11.6* 05/03/2015   HCT 33.4* 05/03/2015   PLT 547* 05/03/2015   MCV 89.8 05/03/2015   MCH 31.2 05/03/2015   MCHC 34.7 05/03/2015   RDW 13.8 05/03/2015   LYMPHSABS 1.0 05/03/2015   MONOABS 0.7 05/03/2015   EOSABS 0.1 05/03/2015   BASOSABS 0.0 05/03/2015   BMET Lab Results  Component Value Date   NA 133* 04/28/2015   K 3.9 04/28/2015   CL 95* 04/28/2015   CO2 32 04/28/2015   GLUCOSE 106* 04/28/2015   BUN 10 04/28/2015   CREATININE 0.97 04/28/2015   CALCIUM 8.3* 04/28/2015   GFRNONAA >60 04/28/2015   GFRAA >60 04/28/2015     Assessment and Plan  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 Will add mtb and mac pcr on tissue from April lung resection. Possibly could be sarcoidosis but presentation is atypical. Will add ace.  Continue on levofloxacin to finish out this week of treatment for  lung abscess.  Will also empirically start for MAC treatment while we wait on MAC PCR results. Will have him provide another sputum specimen for AFB collection, as well. Granted, surgery has debulked majority of disease, he is likely still colonized given his underlying emphysema. May consider short course of treatment of 6 months  Preauricular llymphadenopathy on right ear = non tender continue to follow  hiv disease = continue on current regimen well controlled.  rtc in 4 wk

## 2015-05-21 NOTE — Progress Notes (Signed)
Subjective:    Patient ID: [facility], male    DOB: May 15, 1981, 34 y.o.   MRN: [id number]  Synopsis: [facility]ney [facility] has HIV AIDS and was hospitalized in the fall of 2015 in the setting of a right upper lobe lung mass with mediastinal lymphadenopathy. The mass was visible on bronchoscopy and multiple endobronchial biopsies were taken as were cultures including AFB culture. Biopsy results showed nonspecific inflammatory changes in the AFB culture was negative. He ultimately underwent an right upper lobe decortication and lobectomy. Pathology showed necrotizing granuloma in the right upper lobe mass and mediastinal lymph nodes with negative special stains for infectious etiologies.  HPI Chief Complaint  Patient presents with  . Follow-up    Pt c/o R sided chest pain worse when sneezing or exertion.  Has a prod cough with some clear/white mucus.     [facility] says that his breathing has been OK. He is still having a little trouble takinga  Deep reath, but no major problems. He has some cough with mucus but this has improved.  He says that this was clear, no fever, no chest pain.   He is taking the anti-retrovial therapy.   Past Medical History  Diagnosis Date  . Chest pain 2010  . Hyperlipidemia   . Coronary artery disease     SPONTANEOUS DISSECTION AND PLAQUE RUPTURE OF HIS LEFT ANTERIOR DESCENDING ARTERY  . MI (myocardial infarction)   . HIV infection   . Empyema lung   . Emphysema lung       Review of Systems     Objective:   Physical Exam Filed Vitals:   05/21/15 1417  BP: 134/64  Pulse: 92  Height:  (1.626 m)  Weight: 134 lb (60.782 kg)  SpO2: 99%   RA  Gen: well appearing, no acute distress HEENT: NCAT, EOMi, OP clear,  PULM: CTA B CV: RRR, no mgr, no JVD AB: BS+, soft, nontender,  Ext: warm, no edema, no clubbing, no cyanosis Derm: no rash or skin breakdown Neuro: A&Ox4, MAEW  February 2016 chest x-ray images reviewed> right upper lobe mass  is present  Pathology report and hospital records from 03/2015 reviewed     Assessment & Plan:   Lung mass Rodney Walker had a VATS biopsy and debridement of the right upper lobe lesion in April. Pathology reports from that study showed necrotizing granulomatous inflammation in the lung mass as well as in mediastinal lymph nodes which were biopsied. Special stains and cultures have all been negative. Despite 3 separate special stains which were negative for organisms the fact that he had necrotizing granulomas and the appearance of the lesion and his HIV status makes me feel that this was most likely due to a an atypical mycobacterial disease. However, I agree that sarcoidosis is in the differential but this would be an unusual presentation and the pathology was not classic for that condition. I would prefer to not treat him for sarcoid on an empiric basis at this time because of the risk of immunosuppression with prednisone.  Plan: Follow-up in 6 months with a repeat chest x-ray, he was advised to come back sooner should he have symptoms of pneumonia or bronchitis which were detailed today for him in clinic     Updated Medication List Outpatient Encounter Prescriptions as of 05/21/2015  Medication Sig  . albuterol (PROVENTIL HFA;VENTOLIN HFA) 108 (90 BASE) MCG/ACT inhaler Inhale 2 puffs into the lungs every 6 (six) hours as needed for wheezing or shortness of  breath.  Marland Kitchen aspirin 325 MG tablet Take 650 mg by mouth daily as needed for fever.  . dolutegravir (TIVICAY) 50 MG tablet Take 1 tablet (50 mg total) by mouth daily.  Marland Kitchen gabapentin (NEURONTIN) 100 MG capsule Take 1 capsule (100 mg total) by mouth 3 (three) times daily.  Marland Kitchen ibuprofen (ADVIL,MOTRIN) 200 MG tablet Take 200 mg by mouth every 6 (six) hours as needed (pain).  Marland Kitchen levofloxacin (LEVAQUIN) 750 MG tablet Take 1 tablet (750 mg total) by mouth daily.  Marland Kitchen oxyCODONE (OXY IR/ROXICODONE) 5 MG immediate release tablet Take 1-2 tablets (5-10 mg  total) by mouth every 6 (six) hours as needed for severe pain.  . traMADol (ULTRAM) 50 MG tablet Take 1-2 tablets (50-100 mg total) by mouth every 6 (six) hours as needed (mild pain).  . TRUVADA 200-300 MG per tablet TAKE 1 TABLET BY MOUTH DAILY  . [DISCONTINUED] cyclobenzaprine (FLEXERIL) 10 MG tablet Take 1 tablet (10 mg total) by mouth 3 (three) times daily as needed for muscle spasms. (Patient not taking: Reported on 05/21/2015)   No facility-administered encounter medications on file as of 05/21/2015.

## 2015-05-22 ENCOUNTER — Other Ambulatory Visit (HOSPITAL_COMMUNITY)
Admission: RE | Admit: 2015-05-22 | Discharge: 2015-05-22 | Disposition: A | Discharge status: Home / Self Care | Payer: No Typology Code available for payment source | Source: Ambulatory Visit | Attending: Internal Medicine | Admitting: Internal Medicine

## 2015-05-22 DIAGNOSIS — R918 Other nonspecific abnormal finding of lung field: Secondary | ICD-10-CM | POA: Insufficient documentation

## 2015-05-22 LAB — ANGIOTENSIN CONVERTING ENZYME: Angiotensin-Converting Enzyme: 45 U/L (ref 8–52)

## 2015-05-23 LAB — AFB CULTURE WITH SMEAR (NOT AT ARMC): ACID FAST SMEAR: NONE SEEN

## 2015-05-29 ENCOUNTER — Other Ambulatory Visit: Payer: Self-pay | Admitting: Cardiothoracic Surgery

## 2015-05-29 DIAGNOSIS — J852 Abscess of lung without pneumonia: Secondary | ICD-10-CM

## 2015-05-30 ENCOUNTER — Encounter: Payer: Self-pay | Admitting: Cardiothoracic Surgery

## 2015-05-30 ENCOUNTER — Ambulatory Visit: Payer: No Typology Code available for payment source | Attending: Family Medicine

## 2015-05-30 ENCOUNTER — Ambulatory Visit
Admission: RE | Admit: 2015-05-30 | Discharge: 2015-05-30 | Disposition: A | Discharge status: Home / Self Care | Payer: No Typology Code available for payment source | Source: Ambulatory Visit | Attending: Cardiothoracic Surgery | Admitting: Cardiothoracic Surgery

## 2015-05-30 ENCOUNTER — Ambulatory Visit (INDEPENDENT_AMBULATORY_CARE_PROVIDER_SITE_OTHER): Payer: Self-pay | Admitting: Cardiothoracic Surgery

## 2015-05-30 VITALS — BP 114/68 | HR 76 | Resp 16 | Ht 64.0 in | Wt 136.0 lb

## 2015-05-30 DIAGNOSIS — J852 Abscess of lung without pneumonia: Secondary | ICD-10-CM

## 2015-05-30 DIAGNOSIS — Z902 Acquired absence of lung [part of]: Secondary | ICD-10-CM

## 2015-05-30 DIAGNOSIS — Z9889 Other specified postprocedural states: Secondary | ICD-10-CM

## 2015-05-30 NOTE — Progress Notes (Signed)
PCP is No PCP Per Patient Referring Provider is Alyson Reedy, MD  Chief Complaint  Patient presents with  . Routine Post Op    2 wk f/u with cxr after having seen the P.A.     HPI:the patient returns for one month followup after right upper and middle lobectomy for a large lung abscess. He is doing well. He has right postthoracotomy pain. He denies fever or shortness of breath. He has taken all of his oxycodone. The incision is healed well.   Chest x-ray shows improvement in aeration of the right lung-right lower lobe with improvement in reexpansion edema. No pleural effusion or space.  The patient is not smoking cigarettes.  Past Medical History  Diagnosis Date  . Chest pain 2010  . Hyperlipidemia   . Coronary artery disease     SPONTANEOUS DISSECTION AND PLAQUE RUPTURE OF HIS LEFT ANTERIOR DESCENDING ARTERY  . MI (myocardial infarction)   . HIV infection   . Empyema lung   . Emphysema lung     Past Surgical History  Procedure Laterality Date  . Cardiac catheterization  06/24/2009    EF 60%  . Cardiac catheterization  06/17/2009    EF 45%. ANTERIOR HYPOKINESIS  . US echocardiography  12/09/2009    EF 55-60%  . Video bronchoscopy Bilateral 09/05/2014    Procedure: VIDEO BRONCHOSCOPY WITH FLUORO;  Surgeon: Merwyn Katos, MD;  Location: [facility] ENDOSCOPY;  Service: Cardiopulmonary;  Laterality: Bilateral;  . Video bronchoscopy Bilateral 04/10/2015    Procedure: VIDEO BRONCHOSCOPY WITH FLUORO;  Surgeon: Lupita Leash, MD;  Location: [facility] ENDOSCOPY;  Service: Cardiopulmonary;  Laterality: Bilateral;  . Video assisted thoracoscopy (vats)/ lobectomy Right 04/23/2015    Procedure: VIDEO ASSISTED THORACOSCOPY (VATS)/RIGHT upper  and right middle LOBECTOMY;  Surgeon: Kerin Perna, MD;  Location: [facility] OR;  Service: Thoracic;  Laterality: Right;    Family History  Problem Relation Age of Onset  . Diabetes Mother   . Liver cancer Father     Social History History  Substance Use  Topics  . Smoking status: Former Smoker -- 0.25 packs/day for 17 years    Types: Cigarettes    Quit date: 03/23/2015  . Smokeless tobacco: Never Used     Comment: has cut back significantly, 3 cigs/day.  03/19/15  . Alcohol Use: No    Current Outpatient Prescriptions  Medication Sig Dispense Refill  . albuterol (PROVENTIL HFA;VENTOLIN HFA) 108 (90 BASE) MCG/ACT inhaler Inhale 2 puffs into the lungs every 6 (six) hours as needed for wheezing or shortness of breath. 1 Inhaler 1  . aspirin 325 MG tablet Take 650 mg by mouth daily as needed for fever.    . dolutegravir (TIVICAY) 50 MG tablet Take 1 tablet (50 mg total) by mouth daily. 30 tablet 11  . gabapentin (NEURONTIN) 100 MG capsule Take 1 capsule (100 mg total) by mouth 3 (three) times daily. 90 capsule 3  . ibuprofen (ADVIL,MOTRIN) 200 MG tablet Take 200 mg by mouth every 6 (six) hours as needed (pain).    Marland Kitchen oxyCODONE (OXY IR/ROXICODONE) 5 MG immediate release tablet Take 1-2 tablets (5-10 mg total) by mouth every 6 (six) hours as needed for severe pain. 30 tablet 0  . TRUVADA 200-300 MG per tablet TAKE 1 TABLET BY MOUTH DAILY 30 tablet 6   No current facility-administered medications for this visit.    No Known Allergies  Review of Systems  Gradually gaining strength and improved exercise tolerance Surgical incision healing well  Taking Neurontin but a very low dose  BP 114/68 mmHg  Pulse 76  Resp 16  Ht  (1.626 m)  Wt 136 lb (61.689 kg)  BMI 23.33 kg/m2  SpO2 99% Physical Exam Alert and responsive Breath sounds clear out laterally Heart rhythm regular Surgical incision is well-healed No pedal edema  Diagnostic Tests: Chest x-ray shows improved aeration of right lung-right lower lobe. No pleural effusion  Impression: Doing well after bilobectomy for a large lung abscess  Plan: Return with chest x-ray in approximately 4 weeks Prescription for oxycodone 5 mg tablets provided Prescription for Flexeril 10 mg  each bedtime provided Neurontin prescription for 300 mg by mouth 3 times a day provided  Mikey Bussing, MD [facility] [phone]

## 2015-06-03 ENCOUNTER — Encounter (HOSPITAL_COMMUNITY): Payer: Self-pay

## 2015-06-03 ENCOUNTER — Ambulatory Visit: Payer: No Typology Code available for payment source

## 2015-06-05 LAB — AFB CULTURE WITH SMEAR (NOT AT ARMC)
ACID FAST SMEAR: NONE SEEN
Acid Fast Smear: NONE SEEN

## 2015-06-10 ENCOUNTER — Encounter (HOSPITAL_COMMUNITY): Payer: Self-pay

## 2015-06-18 ENCOUNTER — Encounter: Payer: Self-pay | Admitting: Internal Medicine

## 2015-06-18 ENCOUNTER — Ambulatory Visit (INDEPENDENT_AMBULATORY_CARE_PROVIDER_SITE_OTHER): Payer: Self-pay | Admitting: Internal Medicine

## 2015-06-18 VITALS — BP 107/67 | HR 96 | Temp 97.3°F | Wt 137.0 lb

## 2015-06-18 DIAGNOSIS — B20 Human immunodeficiency virus [HIV] disease: Secondary | ICD-10-CM

## 2015-06-18 DIAGNOSIS — G8918 Other acute postprocedural pain: Secondary | ICD-10-CM

## 2015-06-18 DIAGNOSIS — A310 Pulmonary mycobacterial infection: Secondary | ICD-10-CM

## 2015-06-18 MED ORDER — OXYCODONE HCL 5 MG PO TABS: 5.0000 mg | ORAL_TABLET | Freq: Two times a day (BID) | ORAL | Status: DC | PRN

## 2015-06-18 NOTE — Progress Notes (Signed)
Patient ID: Rodney Walker, male   DOB: 1981-09-01, 34 y.o.   MRN: [id number]       Patient ID: Rodney Walker, male   DOB: 06/18/1981, 34 y.o.   MRN: [id number]  HPI   Outpatient Encounter Prescriptions as of 06/18/2015  Medication Sig  . albuterol (PROVENTIL HFA;VENTOLIN HFA) 108 (90 BASE) MCG/ACT inhaler Inhale 2 puffs into the lungs every 6 (six) hours as needed for wheezing or shortness of breath.  Marland Kitchen aspirin 325 MG tablet Take 650 mg by mouth daily as needed for fever.  . dolutegravir (TIVICAY) 50 MG tablet Take 1 tablet (50 mg total) by mouth daily.  Marland Kitchen gabapentin (NEURONTIN) 100 MG capsule Take 1 capsule (100 mg total) by mouth 3 (three) times daily.  Marland Kitchen ibuprofen (ADVIL,MOTRIN) 200 MG tablet Take 200 mg by mouth every 6 (six) hours as needed (pain).  Marland Kitchen oxyCODONE (OXY IR/ROXICODONE) 5 MG immediate release tablet Take 1-2 tablets (5-10 mg total) by mouth every 6 (six) hours as needed for severe pain.  . TRUVADA 200-300 MG per tablet TAKE 1 TABLET BY MOUTH DAILY   No facility-administered encounter medications on file as of 06/18/2015.     Patient Active Problem List   Diagnosis Date Noted  . Anemia 05/03/2015  . Lung abscess 04/22/2015  . Sepsis due to pneumonia 04/17/2015  . Sepsis 04/17/2015  . Bullous emphysema   . Lobar pneumonia   . Hyperkalemia 04/16/2015  . Empyema lung 04/16/2015  . PNA (pneumonia) 04/16/2015  . HIV (human immunodeficiency virus infection)   . Tobacco abuse 03/21/2015  . Lung mass 08/31/2014  . 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 Myalgia 04/23/2014  . Pharyngitis 03/31/2014  . Hyponatremia 03/31/2014  . Fever 03/30/2014  . AIDS 03/22/2014  . Thrush 03/22/2014  . CAP (community acquired pneumonia) 03/22/2014  . CAD (coronary artery disease) 11/13/2011     Health Maintenance Due  Topic Date Due  . TETANUS/TDAP  10/18/1999     Review of Systems  Physical Exam   BP 107/67 mmHg  Pulse 96  Temp(Src) 97.3 F (36.3  C) (Oral)  Wt 137 lb (62.143 kg)  Lab Results  Component Value Date   CD4TCELL 29* 03/14/2015   Lab Results  Component Value Date   CD4TABS 320* 03/14/2015   CD4TABS 280* 12/13/2014   CD4TABS 300* 08/31/2014   Lab Results  Component Value Date   HIV1RNAQUANT 81* 03/14/2015   No results found for: HEPBSAB No results found for: RPR  CBC Lab Results  Component Value Date   WBC 7.8 05/21/2015   RBC 3.97* 05/21/2015   HGB 12.3* 05/21/2015   HCT 36.6* 05/21/2015   PLT 396 05/21/2015   MCV 92.2 05/21/2015   MCH 31.0 05/21/2015   MCHC 33.6 05/21/2015   RDW 15.6* 05/21/2015   LYMPHSABS 1.3 05/21/2015   MONOABS 0.6 05/21/2015   EOSABS 0.2 05/21/2015   BASOSABS 0.0 05/21/2015   BMET Lab Results  Component Value Date   NA 138 05/21/2015   K 4.3 05/21/2015   CL 102 05/21/2015   CO2 26 05/21/2015   GLUCOSE 54* 05/21/2015   BUN 17 05/21/2015   CREATININE 1.05 05/21/2015   CALCIUM 9.3 05/21/2015   GFRNONAA >89 05/21/2015   GFRAA >89 05/21/2015     Assessment and Plan   hiv disease = well controlled on current meds, continue with current regimen  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 Will start on mac treatment for remaining nodular disease. Rifampin , azithro , and ethambutol  three times per week. Gave rx for zofran to use if needed. Plan to treat for 6-64months  Chest pain from surgery = will refill oxycodone x 3 month, plan to wean at 2 month mark

## 2015-06-19 ENCOUNTER — Telehealth: Payer: Self-pay | Admitting: *Deleted

## 2015-06-19 NOTE — Telephone Encounter (Addendum)
Pt called to find out if his treatment for MAC has been sent to the [facility].  MD please advise.  Dr. Drue Second shared that she is reviewing the lab work and wants to pt to call his pharmacy in the morning.  Left message for the patient with Dr. Feliz Beam information.

## 2015-06-20 MED ORDER — RIFAMPIN 300 MG PO CAPS: 600.0000 mg | ORAL_CAPSULE | ORAL | Status: DC

## 2015-06-20 MED ORDER — AZITHROMYCIN 500 MG PO TABS: 500.0000 mg | ORAL_TABLET | ORAL | Status: DC

## 2015-06-20 MED ORDER — ONDANSETRON 8 MG PO TBDP: 8.0000 mg | ORAL_TABLET | ORAL | Status: DC

## 2015-06-20 MED ORDER — ETHAMBUTOL HCL 400 MG PO TABS: 1600.0000 mg | ORAL_TABLET | ORAL | Status: DC

## 2015-06-21 ENCOUNTER — Other Ambulatory Visit: Payer: Self-pay | Admitting: Cardiothoracic Surgery

## 2015-06-21 DIAGNOSIS — J869 Pyothorax without fistula: Secondary | ICD-10-CM

## 2015-06-24 ENCOUNTER — Encounter: Payer: Self-pay | Admitting: Cardiothoracic Surgery

## 2015-06-24 ENCOUNTER — Ambulatory Visit (INDEPENDENT_AMBULATORY_CARE_PROVIDER_SITE_OTHER): Payer: Self-pay | Admitting: Cardiothoracic Surgery

## 2015-06-24 ENCOUNTER — Ambulatory Visit
Admission: RE | Admit: 2015-06-24 | Discharge: 2015-06-24 | Disposition: A | Discharge status: Home / Self Care | Payer: Self-pay | Source: Ambulatory Visit | Attending: Cardiothoracic Surgery | Admitting: Cardiothoracic Surgery

## 2015-06-24 VITALS — BP 112/70 | HR 75 | Resp 20 | Ht 64.0 in | Wt 137.0 lb

## 2015-06-24 DIAGNOSIS — J852 Abscess of lung without pneumonia: Secondary | ICD-10-CM

## 2015-06-24 DIAGNOSIS — Z9889 Other specified postprocedural states: Secondary | ICD-10-CM

## 2015-06-24 DIAGNOSIS — J869 Pyothorax without fistula: Secondary | ICD-10-CM

## 2015-06-24 DIAGNOSIS — Z902 Acquired absence of lung [part of]: Secondary | ICD-10-CM

## 2015-06-24 MED ORDER — GABAPENTIN 100 MG PO CAPS: 100.0000 mg | ORAL_CAPSULE | Freq: Three times a day (TID) | ORAL | Status: DC

## 2015-06-24 MED ORDER — CYCLOBENZAPRINE HCL 10 MG PO TABS: 10.0000 mg | ORAL_TABLET | Freq: Three times a day (TID) | ORAL | Status: DC | PRN

## 2015-06-24 NOTE — Progress Notes (Signed)
PCP is No PCP Per Patient Referring Provider is Alyson Reedy, MD  Chief Complaint  Patient presents with  . Routine Post Op    3 week f/u with CXR, s/p RT VATS, right thoracotomy, right upper lobectomy and right middle lobectomy 04/23/15    UXN:ATFTDDU returns for 3 month followup after undergoing right thoracotomy and bilobectomy for a large lung abscess in the setting of HIV. The patient has done well clinically. 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  The patient's postthoracotomy pain is improving. He is now on Neurontin and Flexeril and when necessary oxycodone. The patient is resumed working and has no significant shortness of breath with activity.   Past Medical History  Diagnosis Date  . Chest pain 2010  . Hyperlipidemia   . Coronary artery disease     SPONTANEOUS DISSECTION AND PLAQUE RUPTURE OF HIS LEFT ANTERIOR DESCENDING ARTERY  . MI (myocardial infarction)   . HIV infection   . Empyema lung   . Emphysema lung     Past Surgical History  Procedure Laterality Date  . Cardiac catheterization  06/24/2009    EF 60%  . Cardiac catheterization  06/17/2009    EF 45%. ANTERIOR HYPOKINESIS  . US echocardiography  12/09/2009    EF 55-60%  . Video bronchoscopy Bilateral 09/05/2014    Procedure: VIDEO BRONCHOSCOPY WITH FLUORO;  Surgeon: Merwyn Katos, MD;  Location: [facility] ENDOSCOPY;  Service: Cardiopulmonary;  Laterality: Bilateral;  . Video bronchoscopy Bilateral 04/10/2015    Procedure: VIDEO BRONCHOSCOPY WITH FLUORO;  Surgeon: Lupita Leash, MD;  Location: [facility] ENDOSCOPY;  Service: Cardiopulmonary;  Laterality: Bilateral;  . Video assisted thoracoscopy (vats)/ lobectomy Right 04/23/2015    Procedure: VIDEO ASSISTED THORACOSCOPY (VATS)/RIGHT  upper  and right middle LOBECTOMY;  Surgeon: Kerin Perna, MD;  Location: [facility] OR;  Service: Thoracic;  Laterality: Right;    Family History  Problem Relation Age of Onset  . Diabetes Mother   . Liver cancer Father     Social History History  Substance Use Topics  . Smoking status: Former Smoker -- 0.25 packs/day for 17 years    Types: Cigarettes    Quit date: 03/23/2015  . Smokeless tobacco: Never Used     Comment: has cut back significantly, 3 cigs/day.  03/19/15  . Alcohol Use: No    Current Outpatient Prescriptions  Medication Sig Dispense Refill  . albuterol (PROVENTIL HFA;VENTOLIN HFA) 108 (90 BASE) MCG/ACT inhaler Inhale 2 puffs into the lungs every 6 (six) hours as needed for wheezing or shortness of breath. 1 Inhaler 1  . aspirin 325 MG tablet Take 650 mg by mouth daily as needed for fever.    Marland Kitchen azithromycin (ZITHROMAX) 500 MG tablet Take 1 tablet (500 mg total) by mouth 3 (three) times a week. 36 tablet 2  . dolutegravir (TIVICAY) 50 MG tablet Take 1 tablet (50 mg total) by mouth daily. 30 tablet 11  . ethambutol (MYAMBUTOL) 400 MG tablet Take 4 tablets (1,600 mg total) by mouth 3 (three) times a week. 48 tablet 8  . gabapentin (NEURONTIN) 100 MG capsule Take 1 capsule (100 mg total) by mouth 3 (three) times daily. 90 capsule 3  . ibuprofen (ADVIL,MOTRIN) 200 MG tablet Take 200 mg by mouth every 6 (six) hours as needed (pain).    Marland Kitchen  ondansetron (ZOFRAN ODT) 8 MG disintegrating tablet Take 1 tablet (8 mg total) by mouth 3 (three) times a week. 30 tablet 2  . oxyCODONE (OXY IR/ROXICODONE) 5 MG immediate release tablet Take 1-2 tablets (5-10 mg total) by mouth every 12 (twelve) hours as needed for severe pain. 60 tablet 0  . rifampin (RIFADIN) 300 MG capsule Take 2 capsules (600 mg total) by mouth 3 (three) times a week. 72 capsule 2  . TRUVADA 200-300 MG per tablet TAKE 1 TABLET BY MOUTH DAILY 30 tablet 6  . cyclobenzaprine (FLEXERIL) 10 MG tablet Take 1 tablet (10 mg total)  by mouth 3 (three) times daily as needed for muscle spasms. 30 tablet 0   No current facility-administered medications for this visit.    No Known Allergies  Review of Systems   No fever Improved strength and appetite No weight loss Right thoracotomy incision well-healed Patient non-smoker  BP 112/70 mmHg  Pulse 75  Resp 20  Ht  (1.626 m)  Wt 137 lb (62.143 kg)  BMI 23.50 kg/m2  SpO2 99% Physical Exam Alert and comfortable Lungs clear Heart rate regular Incision well-healed  Neuro intact  Diagnostic Tests: Chest x-ray with mild atelectasis, no pneumothorax or pleural effusion  Impression: Excellent recovery after bilobectomy for large lung abscess Patient recently started on antituberculous medications for possible MAC by infectious disease clinic-Dr. Drue Second.  Plan:patient return in approximately 3 months with chest x-ray for surgical followup. Refill prescriptions for Neurontin and Flexeril provided. Patient will try to wean off oxycodone.   Mikey Bussing, MD [facility] [phone]

## 2015-06-26 ENCOUNTER — Ambulatory Visit: Payer: Self-pay

## 2015-06-28 ENCOUNTER — Ambulatory Visit: Payer: No Typology Code available for payment source | Attending: Family Medicine

## 2015-08-08 ENCOUNTER — Other Ambulatory Visit: Payer: Self-pay | Admitting: Infectious Disease

## 2015-08-08 DIAGNOSIS — B20 Human immunodeficiency virus [HIV] disease: Secondary | ICD-10-CM

## 2015-09-23 ENCOUNTER — Other Ambulatory Visit: Payer: Self-pay | Admitting: Cardiothoracic Surgery

## 2015-09-23 DIAGNOSIS — J852 Abscess of lung without pneumonia: Secondary | ICD-10-CM

## 2015-09-25 ENCOUNTER — Encounter: Payer: Self-pay | Admitting: Cardiothoracic Surgery

## 2015-09-25 ENCOUNTER — Ambulatory Visit
Admission: RE | Admit: 2015-09-25 | Discharge: 2015-09-25 | Disposition: A | Discharge status: Home / Self Care | Payer: No Typology Code available for payment source | Source: Ambulatory Visit | Attending: Cardiothoracic Surgery | Admitting: Cardiothoracic Surgery

## 2015-09-25 ENCOUNTER — Ambulatory Visit (INDEPENDENT_AMBULATORY_CARE_PROVIDER_SITE_OTHER): Payer: No Typology Code available for payment source | Admitting: Cardiothoracic Surgery

## 2015-09-25 VITALS — BP 116/72 | HR 82 | Resp 20 | Ht 64.0 in | Wt 137.0 lb

## 2015-09-25 DIAGNOSIS — J852 Abscess of lung without pneumonia: Secondary | ICD-10-CM

## 2015-09-25 DIAGNOSIS — Z9889 Other specified postprocedural states: Secondary | ICD-10-CM

## 2015-09-25 DIAGNOSIS — Z902 Acquired absence of lung [part of]: Secondary | ICD-10-CM

## 2015-09-25 NOTE — Progress Notes (Signed)
PCP is No PCP Per Patient Referring Provider is Alyson Reedy, MD  Chief Complaint  Patient presents with  . Routine Post Op    3 month f/u with CXR    HPI: Patient returns for routine 6 month followup after right VATS and right upper lobe and right middle lobectomy for large lung abscess--history of HIV. Patient is done well clinically. He has no recurrent symptoms of infection. Chest x-ray today is clear. He has minimal postthoracotomy discomfort and takes no narcotics He is a nonsmoker He is planning on going to the ID clinic for influenza vaccine soon which I recommended.   Past Medical History  Diagnosis Date  . Chest pain 2010  . Hyperlipidemia   . Coronary artery disease     SPONTANEOUS DISSECTION AND PLAQUE RUPTURE OF HIS LEFT ANTERIOR DESCENDING ARTERY  . MI (myocardial infarction)   . HIV infection   . Empyema lung   . Emphysema lung     Past Surgical History  Procedure Laterality Date  . Cardiac catheterization  06/24/2009    EF 60%  . Cardiac catheterization  06/17/2009    EF 45%. ANTERIOR HYPOKINESIS  . US echocardiography  12/09/2009    EF 55-60%  . Video bronchoscopy Bilateral 09/05/2014    Procedure: VIDEO BRONCHOSCOPY WITH FLUORO;  Surgeon: Merwyn Katos, MD;  Location: [facility] ENDOSCOPY;  Service: Cardiopulmonary;  Laterality: Bilateral;  . Video bronchoscopy Bilateral 04/10/2015    Procedure: VIDEO BRONCHOSCOPY WITH FLUORO;  Surgeon: Lupita Leash, MD;  Location: [facility] ENDOSCOPY;  Service: Cardiopulmonary;  Laterality: Bilateral;  . Video assisted thoracoscopy (vats)/ lobectomy Right 04/23/2015    Procedure: VIDEO ASSISTED THORACOSCOPY (VATS)/RIGHT upper  and right middle LOBECTOMY;  Surgeon: Kerin Perna, MD;  Location: [facility] OR;  Service: Thoracic;  Laterality: Right;    Family History  Problem Relation Age of Onset  . Diabetes Mother   . Liver cancer Father     Social History Social History  Substance Use Topics  . Smoking status: Former Smoker --  0.25 packs/day for 17 years    Types: Cigarettes    Quit date: 03/23/2015  . Smokeless tobacco: Never Used     Comment: has cut back significantly, 3 cigs/day.  03/19/15  . Alcohol Use: No    Current Outpatient Prescriptions  Medication Sig Dispense Refill  . albuterol (PROVENTIL HFA;VENTOLIN HFA) 108 (90 BASE) MCG/ACT inhaler Inhale 2 puffs into the lungs every 6 (six) hours as needed for wheezing or shortness of breath. 1 Inhaler 1  . aspirin 325 MG tablet Take 650 mg by mouth daily as needed for fever.    Marland Kitchen azithromycin (ZITHROMAX) 500 MG tablet Take 1 tablet (500 mg total) by mouth 3 (three) times a week. 36 tablet 2  . cyclobenzaprine (FLEXERIL) 10 MG tablet Take 1 tablet (10 mg total) by mouth 3 (three) times daily as needed for muscle spasms. 30 tablet 0  . ethambutol (MYAMBUTOL) 400 MG tablet Take 4 tablets (1,600 mg total) by mouth 3 (three) times a week. 48 tablet 8  . gabapentin (NEURONTIN) 100 MG capsule Take 1 capsule (100 mg total) by mouth 3 (three) times daily. 90 capsule 3  . ibuprofen (ADVIL,MOTRIN) 200 MG tablet Take 200 mg by mouth every 6 (six) hours as needed (pain).    . ondansetron (ZOFRAN ODT) 8 MG disintegrating tablet Take 1 tablet (8 mg total) by mouth 3 (three) times a week. 30 tablet 2  . oxyCODONE (OXY IR/ROXICODONE) 5 MG immediate  release tablet Take 1-2 tablets (5-10 mg total) by mouth every 12 (twelve) hours as needed for severe pain. 60 tablet 0  . rifampin (RIFADIN) 300 MG capsule Take 2 capsules (600 mg total) by mouth 3 (three) times a week. 72 capsule 2  . TIVICAY 50 MG tablet TAKE 1 TABLET BY MOUTH DAILY 30 tablet 5  . TRUVADA 200-300 MG per tablet TAKE 1 TABLET BY MOUTH DAILY 30 tablet 6   No current facility-administered medications for this visit.    No Known Allergies  Review of Systems  Minimal incisional pain No shortness of breath or productive cough No palpitations or presyncope No weight loss or fever No edema or extremity pain  BP  116/72 mmHg  Pulse 82  Resp 20  Ht 5\' 4"  (1.626 m)  Wt 137 lb (62.143 kg)  BMI 23.50 kg/m2  SpO2 97% Physical Exam Alert and comfortable No palpable adenopathy the neck Well-healed right thoracotomy scar Breath sounds clear Heart rhythm regular No peripheral edema Abdomen soft  Diagnostic Tests: Chest x-ray taken today personally reviewed shows clear lung fields, no pleural effusion, no infiltrate  Impression: Patient is recovered from his right VATS and bilobectomy for lung abscess  Plan:he return as needed   Rodney Bussing, MD [facility] [phone]

## 2015-09-25 NOTE — Progress Notes (Signed)
Approval faxed to [facility]. Howell, Michelle M, RN  

## 2015-10-16 ENCOUNTER — Ambulatory Visit (INDEPENDENT_AMBULATORY_CARE_PROVIDER_SITE_OTHER): Payer: No Typology Code available for payment source | Admitting: Internal Medicine

## 2015-10-16 ENCOUNTER — Encounter: Payer: Self-pay | Admitting: Internal Medicine

## 2015-10-16 ENCOUNTER — Other Ambulatory Visit: Payer: Self-pay | Admitting: Pharmacist Clinician (PhC)/ Clinical Pharmacy Specialist

## 2015-10-16 ENCOUNTER — Other Ambulatory Visit: Payer: Self-pay

## 2015-10-16 VITALS — BP 110/75 | HR 64 | Temp 98.0°F | Wt 132.0 lb

## 2015-10-16 DIAGNOSIS — T50905A Adverse effect of unspecified drugs, medicaments and biological substances, initial encounter: Secondary | ICD-10-CM

## 2015-10-16 DIAGNOSIS — G609 Hereditary and idiopathic neuropathy, unspecified: Secondary | ICD-10-CM

## 2015-10-16 DIAGNOSIS — A310 Pulmonary mycobacterial infection: Secondary | ICD-10-CM

## 2015-10-16 DIAGNOSIS — Z113 Encounter for screening for infections with a predominantly sexual mode of transmission: Secondary | ICD-10-CM

## 2015-10-16 DIAGNOSIS — T887XXA Unspecified adverse effect of drug or medicament, initial encounter: Secondary | ICD-10-CM

## 2015-10-16 DIAGNOSIS — M940 Chondrocostal junction syndrome [Tietze]: Secondary | ICD-10-CM

## 2015-10-16 DIAGNOSIS — Z736 Limitation of activities due to disability: Secondary | ICD-10-CM

## 2015-10-16 DIAGNOSIS — Z23 Encounter for immunization: Secondary | ICD-10-CM

## 2015-10-16 DIAGNOSIS — B20 Human immunodeficiency virus [HIV] disease: Secondary | ICD-10-CM

## 2015-10-16 LAB — CBC WITH DIFFERENTIAL/PLATELET
BASOS PCT: 0 % (ref 0–1)
Basophils Absolute: 0 10*3/uL (ref 0.0–0.1)
Eosinophils Absolute: 0.1 10*3/uL (ref 0.0–0.7)
Eosinophils Relative: 1 % (ref 0–5)
HCT: 41.9 % (ref 39.0–52.0)
HEMOGLOBIN: 14.8 g/dL (ref 13.0–17.0)
Lymphocytes Relative: 29 % (ref 12–46)
Lymphs Abs: 1.9 10*3/uL (ref 0.7–4.0)
MCH: 31.6 pg (ref 26.0–34.0)
MCHC: 35.3 g/dL (ref 30.0–36.0)
MCV: 89.3 fL (ref 78.0–100.0)
MONOS PCT: 10 % (ref 3–12)
MPV: 9.3 fL (ref 8.6–12.4)
Monocytes Absolute: 0.7 10*3/uL (ref 0.1–1.0)
NEUTROS ABS: 3.9 10*3/uL (ref 1.7–7.7)
NEUTROS PCT: 60 % (ref 43–77)
Platelets: 273 10*3/uL (ref 150–400)
RBC: 4.69 MIL/uL (ref 4.22–5.81)
RDW: 15.5 % (ref 11.5–15.5)
WBC: 6.5 10*3/uL (ref 4.0–10.5)

## 2015-10-16 LAB — COMPLETE METABOLIC PANEL WITH GFR
ALBUMIN: 4.1 g/dL (ref 3.6–5.1)
ALK PHOS: 103 U/L (ref 40–115)
ALT: 11 U/L (ref 9–46)
AST: 17 U/L (ref 10–40)
BUN: 13 mg/dL (ref 7–25)
CO2: 30 mmol/L (ref 20–31)
Calcium: 9.5 mg/dL (ref 8.6–10.3)
Chloride: 104 mmol/L (ref 98–110)
Creat: 1.18 mg/dL (ref 0.60–1.35)
GFR, EST NON AFRICAN AMERICAN: 80 mL/min (ref >=60)
GLUCOSE: 103 mg/dL — AB (ref 65–99)
Potassium: 4 mmol/L (ref 3.5–5.3)
SODIUM: 138 mmol/L (ref 135–146)
Total Bilirubin: 0.5 mg/dL (ref 0.2–1.2)
Total Protein: 7.3 g/dL (ref 6.1–8.1)

## 2015-10-16 MED ORDER — OXYCODONE-ACETAMINOPHEN 5-325 MG PO TABS: 1.0000 | ORAL_TABLET | Freq: Three times a day (TID) | ORAL | Status: DC | PRN

## 2015-10-16 MED ORDER — GABAPENTIN 300 MG PO CAPS: 300.0000 mg | ORAL_CAPSULE | Freq: Three times a day (TID) | ORAL | Status: DC

## 2015-10-16 NOTE — Progress Notes (Signed)
Patient ID: Rodney Walker, male   DOB: December 19, 1981, 34 y.o.   MRN: [id number]       Patient ID: Rodney Walker, male   DOB: [birth year], 34 y.o.   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 He was started on treatment in June. He has been taking meds as directed TIW for hte past 4 months. He was iniitally supposed to come back sooner but for some reason delayed in getting back into clinic.  Still having rib pain and numbness to right sided chest wall from prior surgery in April.  He also has noticed waking up occasionally iwht legs sensation of falling asleep, as well as noticing increasing episodes of numbness to finger tips. No visual deficits or blurry vision  Outpatient Encounter Prescriptions as of 10/16/2015  Medication Sig  . albuterol (PROVENTIL HFA;VENTOLIN HFA) 108 (90 BASE) MCG/ACT inhaler Inhale 2 puffs into the lungs every 6 (six) hours as needed for wheezing or shortness of breath.  Marland Kitchen aspirin 325 MG tablet Take 650 mg by mouth daily as needed for fever.  Marland Kitchen azithromycin (ZITHROMAX) 500 MG tablet Take 1 tablet (500 mg total) by mouth 3 (three) times a week.  . ethambutol (MYAMBUTOL) 400 MG tablet Take 4 tablets (1,600 mg total) by mouth 3 (three) times a week.  . gabapentin (NEURONTIN) 100 MG capsule Take 1 capsule (100 mg total) by mouth 3 (three) times daily.  Marland Kitchen ibuprofen (ADVIL,MOTRIN) 200 MG tablet Take 200 mg by mouth every 6 (six) hours as needed (pain).  . ondansetron (ZOFRAN ODT) 8 MG disintegrating tablet Take 1 tablet (8 mg total) by mouth 3 (three) times a week.  . rifampin (RIFADIN) 300 MG capsule Take 2 capsules (600 mg total) by mouth 3 (three) times a week.  Marland Kitchen TIVICAY 50 MG tablet TAKE 1 TABLET BY MOUTH DAILY  . TRUVADA 200-300 MG per tablet TAKE 1 TABLET BY MOUTH DAILY  . [DISCONTINUED] oxyCODONE (OXY IR/ROXICODONE) 5  MG immediate release tablet Take 1-2 tablets (5-10 mg total) by mouth every 12 (twelve) hours as needed for severe pain.   No facility-administered encounter medications on file as of 10/16/2015.     Patient Active Problem List   Diagnosis Date Noted  . Anemia 05/03/2015  . Lung abscess (HCC) 04/22/2015  . Sepsis due to pneumonia (HCC) 04/17/2015  . Sepsis (HCC) 04/17/2015  . Bullous emphysema (HCC)   . Lobar pneumonia (HCC)   . Hyperkalemia 04/16/2015  . Empyema lung (HCC) 04/16/2015  . PNA (pneumonia) 04/16/2015  . HIV (human immunodeficiency virus infection) (HCC)   . Tobacco abuse 03/21/2015  . Lung mass 08/31/2014  . 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 Myalgia 04/23/2014  . Pharyngitis 03/31/2014  . Hyponatremia 03/31/2014  . Fever 03/30/2014  . AIDS (HCC) 03/22/2014  . Thrush 03/22/2014  . CAP (community acquired pneumonia) 03/22/2014  . CAD (coronary artery disease) 11/13/2011     Health Maintenance Due  Topic Date Due  . TETANUS/TDAP  10/18/1999  . INFLUENZA VACCINE  07/29/2015     Review of Systems  Physical Exam   BP 110/75 mmHg  Pulse 64  Temp(Src) 98 F (36.7 C) (Oral)  Wt 132 lb (59.875 kg)  Lab Results  Component Value Date   CD4TCELL 29* 03/14/2015   Lab Results  Component Value Date   CD4TABS 320* 03/14/2015   CD4TABS 280*  12/13/2014   CD4TABS 300* 08/31/2014   Lab Results  Component Value Date   HIV1RNAQUANT 81* 03/14/2015   No results found for: HEPBSAB No results found for: RPR  CBC Lab Results  Component Value Date   WBC 7.8 05/21/2015   RBC 3.97* 05/21/2015   HGB 12.3* 05/21/2015   HCT 36.6* 05/21/2015   PLT 396 05/21/2015   MCV 92.2 05/21/2015   MCH 31.0 05/21/2015   MCHC 33.6 05/21/2015   RDW 15.6* 05/21/2015   LYMPHSABS 1.3 05/21/2015   MONOABS 0.6 05/21/2015   EOSABS 0.2 05/21/2015   BASOSABS 0.0 05/21/2015   BMET Lab Results  Component Value Date   NA 138 05/21/2015   K 4.3 05/21/2015   CL 102  05/21/2015   CO2 26 05/21/2015   GLUCOSE 54* 05/21/2015   BUN 17 05/21/2015   CREATININE 1.05 05/21/2015   CALCIUM 9.3 05/21/2015   GFRNONAA >89 05/21/2015   GFRAA >89 05/21/2015     Assessment and Plan   hiv disease = will check cd 4 count and viral load  Mac treatment = having drug interaction with rifampin, plus toxicity of EMB. We will temporarily stop treatment, have him come back in  2 wk to see if sympoms improved. If peripheral neuropathy resolve, would restart azithromycin with rifabutin and also consider inhaled amikacin for presumed MAC treatment. He also has follow up with Dr. Kendrick Fries from pulmonary to assess if need to repeat bronch for AFB cultures  Pain = will switch to increase gabapentin to 300 TID for neuropathic pain and give refill on opiates. No hx of drug abuse. 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 We will discontinue to see that it improves

## 2015-10-17 ENCOUNTER — Other Ambulatory Visit: Payer: Self-pay | Admitting: Internal Medicine

## 2015-10-17 LAB — RPR

## 2015-10-18 LAB — T-HELPER CELL (CD4) - (RCID CLINIC ONLY)
CD4 % Helper T Cell: 27 % — ABNORMAL LOW (ref 33–55)
CD4 T CELL ABS: 540 /uL (ref 400–2700)

## 2015-10-18 LAB — HIV-1 RNA QUANT-NO REFLEX-BLD
HIV 1 RNA Quant: 37 copies/mL — ABNORMAL HIGH (ref <20)
HIV-1 RNA Quant, Log: 1.57 Log copies/mL — ABNORMAL HIGH (ref <1.30)

## 2015-10-24 ENCOUNTER — Telehealth: Payer: Self-pay | Admitting: *Deleted

## 2015-10-24 NOTE — Telephone Encounter (Signed)
Patient needs letter today stating why he is physically and mentally unable to work or he will loose his food stamps.  He received the "letter request a while ago, but forgot about it until they reminded him today." RN explained that we would not be able to provide that letter today, that he would need an appointment to discuss this.  Patient did not discuss this at his visit last week. RN scheduled patient for an appointment to discuss this with Dr. Drue Second, transferred patient to his [facility] Case Manager Meagan for further resources. Andree Coss, RN

## 2015-10-28 ENCOUNTER — Ambulatory Visit (INDEPENDENT_AMBULATORY_CARE_PROVIDER_SITE_OTHER): Payer: Medicaid Other | Admitting: Internal Medicine

## 2015-10-28 ENCOUNTER — Encounter: Payer: Self-pay | Admitting: Internal Medicine

## 2015-10-28 VITALS — BP 106/70 | HR 88 | Temp 98.3°F | Wt 135.0 lb

## 2015-10-28 DIAGNOSIS — J8410 Pulmonary fibrosis, unspecified: Secondary | ICD-10-CM

## 2015-10-28 DIAGNOSIS — J984 Other disorders of lung: Secondary | ICD-10-CM | POA: Diagnosis not present

## 2015-10-28 DIAGNOSIS — B20 Human immunodeficiency virus [HIV] disease: Secondary | ICD-10-CM | POA: Diagnosis not present

## 2015-10-28 DIAGNOSIS — M792 Neuralgia and neuritis, unspecified: Secondary | ICD-10-CM | POA: Diagnosis not present

## 2015-10-28 DIAGNOSIS — J439 Emphysema, unspecified: Secondary | ICD-10-CM

## 2015-10-28 DIAGNOSIS — R0781 Pleurodynia: Secondary | ICD-10-CM

## 2015-10-28 MED ORDER — AZITHROMYCIN 500 MG PO TABS: 500.0000 mg | ORAL_TABLET | ORAL | Status: DC

## 2015-10-28 MED ORDER — DULOXETINE HCL 30 MG PO CPEP: 30.0000 mg | ORAL_CAPSULE | Freq: Every day | ORAL | Status: DC

## 2015-10-28 MED ORDER — RIFABUTIN 150 MG PO CAPS: 300.0000 mg | ORAL_CAPSULE | ORAL | Status: DC

## 2015-10-28 NOTE — Progress Notes (Signed)
Patient ID: Rodney Walker, male   DOB: [birth year], 34 y.o.   MRN: [id number]       Patient ID: Rodney Walker, male   DOB: 1981-10-08, 34 y.o.   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 He was started on treatment in June-mid Oct. He has been taking meds as directed TIW for hte past 4 months. He was iniitally supposed to come back sooner but for some reason delayed in getting back into clinic.He was seen on Oct 19th, asked to stop taking MAC treatment due to concern for EMB side effects. 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 Still feels numbness to right sided chest wall. As well as pain + with movement to right chest wall.  - he reports only walking 30 min before becoming short of breath, but denies any signficant shortness of breath at rest or any productive cough  Last cxr on 9/28 when he last saw Dr. Donata Walker at his routine 6 month followup after right VATS and right upper lobe and right middle lobectomy for large lung abscess--history of HIV. Patient is done well clinically. He has no recurrent symptoms of infection.  Outpatient Encounter Prescriptions as of 10/28/2015  Medication Sig  . albuterol (PROVENTIL HFA;VENTOLIN HFA) 108 (90 BASE) MCG/ACT inhaler Inhale 2 puffs into the lungs every 6 (six) hours as needed for wheezing or shortness of breath.  Marland Kitchen aspirin 325 MG tablet Take 650 mg by mouth daily as needed for fever.  . gabapentin (NEURONTIN) 300 MG capsule Take 1 capsule (300 mg total) by mouth 3 (three) times daily.  Marland Kitchen ibuprofen (ADVIL,MOTRIN) 200 MG tablet Take 200 mg by mouth every 6 (six) hours as needed (pain).  . ondansetron (ZOFRAN ODT) 8 MG disintegrating  tablet Take 1 tablet (8 mg total) by mouth 3 (three) times a week.  Marland Kitchen oxyCODONE-acetaminophen (PERCOCET/ROXICET) 5-325 MG tablet Take 1 tablet by mouth every 8 (eight) hours as needed for severe pain.  Marland Kitchen TIVICAY 50 MG tablet TAKE 1 TABLET BY MOUTH DAILY  . TRUVADA 200-300 MG per tablet TAKE 1 TABLET BY MOUTH DAILY   No facility-administered encounter medications on file as of 10/28/2015.     Patient Active Problem List   Diagnosis Date Noted  . Anemia 05/03/2015  . Lung abscess (HCC) 04/22/2015  . Sepsis due to pneumonia (HCC) 04/17/2015  . Sepsis (HCC) 04/17/2015  . Bullous emphysema (HCC)   . Lobar pneumonia (HCC)   . Hyperkalemia 04/16/2015  . Empyema lung (HCC) 04/16/2015  . PNA (pneumonia) 04/16/2015  . HIV (human immunodeficiency virus infection) (HCC)   . Tobacco abuse 03/21/2015  . Lung mass 08/31/2014  . 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 Myalgia 04/23/2014  . Pharyngitis 03/31/2014  . Hyponatremia 03/31/2014  . Fever 03/30/2014  . AIDS (HCC) 03/22/2014  . Thrush 03/22/2014  . CAP (community acquired pneumonia) 03/22/2014  . CAD (coronary artery disease) 11/13/2011     Health Maintenance Due  Topic Date Due  . TETANUS/TDAP  10/18/1999     Review of Systems  Physical Exam   BP 106/70 mmHg  Pulse 88  Temp(Src) 98.3 F (36.8 C) (Oral)  Wt 135 lb (61.236 kg) Physical Exam  Constitutional: He is oriented to person, place, and time. He appears well-developed and well-nourished. No distress.  HENT:  Mouth/Throat: Oropharynx is clear and moist. No oropharyngeal exudate.  Cardiovascular: Normal rate, regular rhythm and normal heart sounds. Exam reveals no gallop and no friction rub.  No murmur heard.  Pulmonary/Chest: Effort normal and breath sounds normal. No respiratory distress. He has no wheezes. Reports having decrease sensation to sub-axillary area of right chest wall. No reproducible pain on exam Lymphadenopathy:  He has no cervical adenopathy.    Neurological: He is alert and oriented to person, place, and time.  Skin: Skin is warm and dry. No rash noted. No erythema.  Psychiatric: He has a normal mood and affect. His behavior is normal.    Lab Results  Component Value Date   CD4TCELL 27* 10/16/2015   Lab Results  Component Value Date   CD4TABS 540 10/16/2015   CD4TABS 320* 03/14/2015   CD4TABS 280* 12/13/2014   Lab Results  Component Value Date   HIV1RNAQUANT 37* 10/16/2015   No results found for: HEPBSAB No results found for: RPR  CBC Lab Results  Component Value Date   WBC 6.5 10/16/2015   RBC 4.69 10/16/2015   HGB 14.8 10/16/2015   HCT 41.9 10/16/2015   PLT 273 10/16/2015   MCV 89.3 10/16/2015   MCH 31.6 10/16/2015   MCHC 35.3 10/16/2015   RDW 15.5 10/16/2015   LYMPHSABS 1.9 10/16/2015   MONOABS 0.7 10/16/2015   EOSABS 0.1 10/16/2015   BASOSABS 0.0 10/16/2015   BMET Lab Results  Component Value Date   NA 138 10/16/2015   K 4.0 10/16/2015   CL 104 10/16/2015   CO2 30 10/16/2015   GLUCOSE 103* 10/16/2015   BUN 13 10/16/2015   CREATININE 1.18 10/16/2015   CALCIUM 9.5 10/16/2015   GFRNONAA 80 10/16/2015   GFRAA >89 10/16/2015     Assessment and Plan  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 For now will continue with azithromycin and rifabutin for treatment. He is currently on 5 month of treatment. Avoid EMB due to toxicity. Will recollect sputum and check for AFB  Rib pain = unclear if this still all post op rib discomfort, though it has bene 6 months post surgery. We will get pulm cxr today  Peripheral neuropathy chest wall = will add cymbalta in addition to gabapentin to see if can help. If still little improvement ,can titrate up gabapentin  COPD - has follow up with Dr. Kendrick Walker in early December  rtc in 3 wk

## 2015-10-31 ENCOUNTER — Ambulatory Visit: Payer: No Typology Code available for payment source | Admitting: Internal Medicine

## 2015-11-28 ENCOUNTER — Ambulatory Visit (INDEPENDENT_AMBULATORY_CARE_PROVIDER_SITE_OTHER): Payer: Medicaid Other | Admitting: Internal Medicine

## 2015-11-28 VITALS — Wt 131.0 lb

## 2015-11-28 DIAGNOSIS — M940 Chondrocostal junction syndrome [Tietze]: Secondary | ICD-10-CM | POA: Diagnosis not present

## 2015-11-28 DIAGNOSIS — R110 Nausea: Secondary | ICD-10-CM | POA: Diagnosis not present

## 2015-11-28 MED ORDER — OXYCODONE-ACETAMINOPHEN 5-325 MG PO TABS: 1.0000 | ORAL_TABLET | Freq: Three times a day (TID) | ORAL | Status: DC | PRN

## 2015-11-28 MED ORDER — PROMETHAZINE HCL 25 MG PO TABS: 25.0000 mg | ORAL_TABLET | Freq: Four times a day (QID) | ORAL | Status: DC | PRN

## 2015-11-28 NOTE — Progress Notes (Signed)
Patient ID: Rodney Walker, male   DOB: 1981-07-26, 34 y.o.   MRN: [id number]       Patient ID: Rodney Walker, male   DOB: [birth year], 34 y.o.   MRN: [id number]  HPI  Rodney Walker is a 34yo AAM with HIV disease, COPD/bullous emphysema. In the April 2016 he underwent RUL/RML resection due to lung abscess found to be necrotizing granuloma but no tissue sent for culture, presumed MAC. On tivicay/truvada for HIV disease management. CD 4 count of 540/VL 37 on Oct 2016. 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 He is now his 6th month of MAC treatment. For the past month, he is on azithromycin and rifabutin TIW.  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  Outpatient Encounter Prescriptions as of 11/28/2015  Medication Sig  . albuterol (PROVENTIL HFA;VENTOLIN HFA) 108 (90 BASE) MCG/ACT inhaler Inhale 2 puffs into the lungs every 6 (six) hours as needed for wheezing or shortness of breath.  Marland Kitchen aspirin 325 MG tablet Take 650 mg by mouth daily as needed for fever.  Marland Kitchen azithromycin (ZITHROMAX) 500 MG tablet Take 1 tablet (500 mg total) by mouth 3 (three) times a week.  . DULoxetine (CYMBALTA) 30 MG capsule Take 1 capsule (30 mg total) by mouth daily.  Marland Kitchen gabapentin (NEURONTIN) 300 MG capsule Take 1 capsule (300 mg total) by mouth 3 (three) times daily.  Marland Kitchen ibuprofen (ADVIL,MOTRIN) 200 MG tablet Take 200 mg by mouth every 6 (six) hours as needed (pain).  . ondansetron (ZOFRAN ODT) 8 MG disintegrating tablet Take 1 tablet (8 mg total) by mouth 3 (three) times a week.  Marland Kitchen oxyCODONE-acetaminophen (PERCOCET/ROXICET) 5-325 MG tablet Take 1 tablet by mouth every 8 (eight) hours as needed for severe pain.  . rifabutin (MYCOBUTIN) 150 MG capsule Take 2 capsules (300 mg total) by mouth 3 (three) times a week.  Marland Kitchen TIVICAY 50 MG tablet TAKE 1 TABLET BY MOUTH  DAILY  . TRUVADA 200-300 MG per tablet TAKE 1 TABLET BY MOUTH DAILY   No facility-administered encounter medications on file as of 11/28/2015.     Patient Active Problem List   Diagnosis Date Noted  . Anemia 05/03/2015  . Lung abscess (HCC) 04/22/2015  . Sepsis due to pneumonia (HCC) 04/17/2015  . Sepsis (HCC) 04/17/2015  . Bullous emphysema (HCC)   . Lobar pneumonia (HCC)   . Hyperkalemia 04/16/2015  . Empyema lung (HCC) 04/16/2015  . PNA (pneumonia) 04/16/2015  . HIV (human immunodeficiency virus infection) (HCC)   . Tobacco abuse 03/21/2015  . Lung mass 08/31/2014  . 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 Myalgia 04/23/2014  . Pharyngitis 03/31/2014  . Hyponatremia 03/31/2014  . Fever 03/30/2014  . AIDS (HCC) 03/22/2014  . Thrush 03/22/2014  . CAP (community acquired pneumonia) 03/22/2014  . CAD (coronary artery disease) 11/13/2011     Health Maintenance Due  Topic Date Due  . TETANUS/TDAP  10/18/1999     Review of Systems  Physical Exam   Wt 131 lb (59.421 kg)  Lab Results  Component Value Date   CD4TCELL 27* 10/16/2015   Lab Results  Component Value Date   CD4TABS 540 10/16/2015   CD4TABS 320* 03/14/2015   CD4TABS 280* 12/13/2014   Lab Results  Component Value Date   HIV1RNAQUANT 37* 10/16/2015   No results found for: HEPBSAB No results found for: RPR  CBC Lab Results  Component Value Date   WBC 6.5 10/16/2015   RBC 4.69 10/16/2015   HGB 14.8 10/16/2015   HCT 41.9 10/16/2015   PLT 273 10/16/2015   MCV 89.3 10/16/2015   MCH 31.6 10/16/2015   MCHC 35.3 10/16/2015   RDW 15.5 10/16/2015   LYMPHSABS 1.9 10/16/2015   MONOABS 0.7 10/16/2015   EOSABS 0.1 10/16/2015   BASOSABS 0.0 10/16/2015   BMET Lab Results  Component Value Date   NA 138 10/16/2015   K 4.0 10/16/2015   CL 104 10/16/2015   CO2 30 10/16/2015   GLUCOSE 103* 10/16/2015   BUN 13 10/16/2015   CREATININE 1.18 10/16/2015   CALCIUM 9.5 10/16/2015   GFRNONAA 80 10/16/2015     GFRAA >89 10/16/2015     Assessment and Plan  hiv disease = well-controlled, continue on current regimen  Granulomatous disease ,presumed mac = will finish 6 month course this coming month. Thought to be MAc though nothing sent for cx, only had PCR which could be negative in setting of sampling  Chest wall pain = gave refill on percocet  Nausea = switched to phenergan since did not see improvement with zofran  rtc in 6 wk

## 2015-12-09 ENCOUNTER — Ambulatory Visit: Payer: No Typology Code available for payment source | Admitting: Pulmonary Disease

## 2015-12-31 ENCOUNTER — Other Ambulatory Visit: Payer: Self-pay | Admitting: Internal Medicine

## 2015-12-31 DIAGNOSIS — B20 Human immunodeficiency virus [HIV] disease: Secondary | ICD-10-CM

## 2015-12-31 MED ORDER — DOLUTEGRAVIR SODIUM 50 MG PO TABS: 50.0000 mg | ORAL_TABLET | Freq: Every day | ORAL | Status: DC

## 2016-01-09 ENCOUNTER — Encounter: Payer: Self-pay | Admitting: Internal Medicine

## 2016-01-09 ENCOUNTER — Ambulatory Visit (INDEPENDENT_AMBULATORY_CARE_PROVIDER_SITE_OTHER): Payer: Medicaid Other | Admitting: Internal Medicine

## 2016-01-09 VITALS — BP 112/72 | HR 87 | Temp 98.4°F | Ht 65.0 in | Wt 136.5 lb

## 2016-01-09 DIAGNOSIS — B20 Human immunodeficiency virus [HIV] disease: Secondary | ICD-10-CM

## 2016-01-09 DIAGNOSIS — A310 Pulmonary mycobacterial infection: Secondary | ICD-10-CM | POA: Diagnosis not present

## 2016-01-09 DIAGNOSIS — M940 Chondrocostal junction syndrome [Tietze]: Secondary | ICD-10-CM

## 2016-01-09 LAB — CBC WITH DIFFERENTIAL/PLATELET
BASOS ABS: 0 10*3/uL (ref 0.0–0.1)
Basophils Relative: 0 % (ref 0–1)
EOS PCT: 1 % (ref 0–5)
Eosinophils Absolute: 0.1 10*3/uL (ref 0.0–0.7)
HEMATOCRIT: 41.1 % (ref 39.0–52.0)
Hemoglobin: 14.4 g/dL (ref 13.0–17.0)
LYMPHS ABS: 1.3 10*3/uL (ref 0.7–4.0)
LYMPHS PCT: 20 % (ref 12–46)
MCH: 31.8 pg (ref 26.0–34.0)
MCHC: 35 g/dL (ref 30.0–36.0)
MCV: 90.7 fL (ref 78.0–100.0)
MONO ABS: 0.6 10*3/uL (ref 0.1–1.0)
MPV: 9.5 fL (ref 8.6–12.4)
Monocytes Relative: 9 % (ref 3–12)
NEUTROS ABS: 4.4 10*3/uL (ref 1.7–7.7)
Neutrophils Relative %: 70 % (ref 43–77)
Platelets: 249 10*3/uL (ref 150–400)
RBC: 4.53 MIL/uL (ref 4.22–5.81)
RDW: 13.4 % (ref 11.5–15.5)
WBC: 6.3 10*3/uL (ref 4.0–10.5)

## 2016-01-09 LAB — COMPLETE METABOLIC PANEL WITH GFR
ALT: 8 U/L — AB (ref 9–46)
AST: 14 U/L (ref 10–40)
Albumin: 4 g/dL (ref 3.6–5.1)
Alkaline Phosphatase: 85 U/L (ref 40–115)
BUN: 14 mg/dL (ref 7–25)
CHLORIDE: 104 mmol/L (ref 98–110)
CO2: 24 mmol/L (ref 20–31)
CREATININE: 1.01 mg/dL (ref 0.60–1.35)
Calcium: 9.5 mg/dL (ref 8.6–10.3)
GFR, Est African American: >89 mL/min (ref >=60)
GFR, Est Non African American: >89 mL/min (ref >=60)
GLUCOSE: 88 mg/dL (ref 65–99)
POTASSIUM: 4.3 mmol/L (ref 3.5–5.3)
SODIUM: 137 mmol/L (ref 135–146)
Total Bilirubin: 0.4 mg/dL (ref 0.2–1.2)
Total Protein: 7 g/dL (ref 6.1–8.1)

## 2016-01-09 MED ORDER — OXYCODONE-ACETAMINOPHEN 5-325 MG PO TABS: 1.0000 | ORAL_TABLET | Freq: Three times a day (TID) | ORAL | Status: DC | PRN

## 2016-01-09 MED ORDER — ELVITEG-COBIC-EMTRICIT-TENOFAF 150-150-200-10 MG PO TABS: 1.0000 | ORAL_TABLET | Freq: Every day | ORAL | Status: DC

## 2016-01-09 NOTE — Progress Notes (Signed)
Patient ID: Rodney Walker, male   DOB: 18-Jul-1981, 35 y.o.   MRN: [id number]       Patient ID: Rodney Walker, male   DOB: 09/26/1981, 35 y.o.   MRN: [id number]  HPI  35yo M with HIV disease, well controlled, CD 4 count of 540/VL 37 on tivicay/truvada. He also has hx of bullous emphysema with lung resection of lung abscess, with path showing granulomatous disease. He was empirically treated for pulmonary MAC. He still has sequelae of right chest wall pain/neuropathy from thoracotomy site. He is other wise doing well.   Outpatient Encounter Prescriptions as of 01/09/2016  Medication Sig  . albuterol (PROVENTIL HFA;VENTOLIN HFA) 108 (90 BASE) MCG/ACT inhaler Inhale 2 puffs into the lungs every 6 (six) hours as needed for wheezing or shortness of breath.  Marland Kitchen aspirin 325 MG tablet Take 650 mg by mouth daily as needed for fever.  Marland Kitchen azithromycin (ZITHROMAX) 500 MG tablet Take 1 tablet (500 mg total) by mouth 3 (three) times a week.  . dolutegravir (TIVICAY) 50 MG tablet Take 1 tablet (50 mg total) by mouth daily.  . DULoxetine (CYMBALTA) 30 MG capsule Take 1 capsule (30 mg total) by mouth daily.  Marland Kitchen gabapentin (NEURONTIN) 300 MG capsule Take 1 capsule (300 mg total) by mouth 3 (three) times daily.  Marland Kitchen ibuprofen (ADVIL,MOTRIN) 200 MG tablet Take 200 mg by mouth every 6 (six) hours as needed (pain).  . promethazine (PHENERGAN) 25 MG tablet Take 1 tablet (25 mg total) by mouth every 6 (six) hours as needed for nausea or vomiting.  . rifabutin (MYCOBUTIN) 150 MG capsule Take 2 capsules (300 mg total) by mouth 3 (three) times a week.  . TRUVADA 200-300 MG tablet TAKE 1 TABLET BY MOUTH DAILY  . oxyCODONE-acetaminophen (PERCOCET/ROXICET) 5-325 MG tablet Take 1 tablet by mouth every 8 (eight) hours as needed for severe pain. (Patient not taking: Reported on 01/09/2016)   No facility-administered encounter medications on file as of 01/09/2016.     Patient Active Problem List   Diagnosis  Date Noted  . Anemia 05/03/2015  . Lung abscess (HCC) 04/22/2015  . Sepsis due to pneumonia (HCC) 04/17/2015  . Sepsis (HCC) 04/17/2015  . Bullous emphysema (HCC)   . Lobar pneumonia (HCC)   . Hyperkalemia 04/16/2015  . Empyema lung (HCC) 04/16/2015  . PNA (pneumonia) 04/16/2015  . HIV (human immunodeficiency virus infection) (HCC)   . Tobacco abuse 03/21/2015  . Lung mass 08/31/2014  . 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 Myalgia 04/23/2014  . Pharyngitis 03/31/2014  . Hyponatremia 03/31/2014  . Fever 03/30/2014  . AIDS (HCC) 03/22/2014  . Thrush 03/22/2014  . CAP (community acquired pneumonia) 03/22/2014  . CAD (coronary artery disease) 11/13/2011     Health Maintenance Due  Topic Date Due  . TETANUS/TDAP  10/18/1999     Review of Systems + chest wall numbness, and pain right sided. Otherwise 10 point ros is negative Physical Exam   BP 112/72 mmHg  Pulse 87  Temp(Src) 98.4 F (36.9 C) (Oral)  Ht  (1.651 m)  Wt 136 lb 8 oz (61.916 kg)  BMI 22.71 kg/m2 Physical Exam  Constitutional: He is oriented to person, place, and time. He appears well-developed and well-nourished. No distress.  HENT:  Mouth/Throat: Oropharynx is clear and moist. No oropharyngeal exudate.  Cardiovascular: Normal rate, regular rhythm and normal heart sounds. Exam reveals no gallop and no friction rub.  No murmur heard.  Chest wall = can illicit pain with  palpation right chest wall Pulmonary/Chest: Effort normal and breath sounds normal. No respiratory distress. He has no wheezes.  Abdominal: Soft. Bowel sounds are normal. He exhibits no distension. There is no tenderness.  Lymphadenopathy:  He has no cervical adenopathy.  Neurological: He is alert and oriented to person, place, and time.  Skin: Skin is warm and dry. No rash noted. No erythema.  Ext: clubbing Psychiatric: He has a normal mood and affect. His behavior is normal.    Lab Results  Component Value Date   CD4TCELL 27*  10/16/2015   Lab Results  Component Value Date   CD4TABS 540 10/16/2015   CD4TABS 320* 03/14/2015   CD4TABS 280* 12/13/2014   Lab Results  Component Value Date   HIV1RNAQUANT 37* 10/16/2015   No results found for: HEPBSAB No results found for: RPR  CBC Lab Results  Component Value Date   WBC 6.5 10/16/2015   RBC 4.69 10/16/2015   HGB 14.8 10/16/2015   HCT 41.9 10/16/2015   PLT 273 10/16/2015   MCV 89.3 10/16/2015   MCH 31.6 10/16/2015   MCHC 35.3 10/16/2015   RDW 15.5 10/16/2015   LYMPHSABS 1.9 10/16/2015   MONOABS 0.7 10/16/2015   EOSABS 0.1 10/16/2015   BASOSABS 0.0 10/16/2015   BMET Lab Results  Component Value Date   NA 138 10/16/2015   K 4.0 10/16/2015   CL 104 10/16/2015   CO2 30 10/16/2015   GLUCOSE 103* 10/16/2015   BUN 13 10/16/2015   CREATININE 1.18 10/16/2015   CALCIUM 9.5 10/16/2015   GFRNONAA 80 10/16/2015   GFRAA >89 10/16/2015     Assessment and Plan  Right chest wall neuropathy/pain = will refill prn percocet #30 nr. May need to consider switch to neurontin to see if it improves symptoms  hiv disease = will change him from tivicay-truvada to genvoya. 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 Will stop taking azithromycin, rifabutin.

## 2016-01-10 LAB — HIV-1 RNA QUANT-NO REFLEX-BLD: HIV-1 RNA Quant, Log: <1.3 Log copies/mL (ref <1.30)

## 2016-01-10 LAB — T-HELPER CELL (CD4) - (RCID CLINIC ONLY)
CD4 % Helper T Cell: 29 % — ABNORMAL LOW (ref 33–55)
CD4 T Cell Abs: 330 /uL — ABNORMAL LOW (ref 400–2700)

## 2016-01-13 ENCOUNTER — Telehealth: Payer: Self-pay | Admitting: *Deleted

## 2016-01-13 NOTE — Telephone Encounter (Signed)
Patient called and advised he has had an episode where he had a stomach ache and cold sweats. It has happened about 2 other times. He is not sure why or what he should do next. Advised him will let the doctor know but he should continue his medication.

## 2016-02-07 ENCOUNTER — Telehealth: Payer: Self-pay | Admitting: *Deleted

## 2016-02-07 NOTE — Telephone Encounter (Signed)
Patient with active medicaid;  RW/ADAP forms need to be updated. Patient also calling for refill of pain medication.  Per last office note, no refills from Dr. Drue Second. Patient does have insurance,he will seek primary care if he wants to continue the pain prescription. Andree Coss, RN

## 2016-02-11 ENCOUNTER — Telehealth: Payer: Self-pay | Admitting: *Deleted

## 2016-02-11 NOTE — Telephone Encounter (Signed)
Patient called and asked if he could have a refill on his Percocet 5-325. Advised him will have to ask the doctor who will not be in the clinic to sign until Thursday 02/13/16 if she says it is ok to refill. Advised him will ask and give him a call back once she responds.

## 2016-02-20 ENCOUNTER — Encounter: Payer: Self-pay | Admitting: Pulmonary Disease

## 2016-02-20 ENCOUNTER — Ambulatory Visit (INDEPENDENT_AMBULATORY_CARE_PROVIDER_SITE_OTHER)
Admission: RE | Admit: 2016-02-20 | Discharge: 2016-02-20 | Disposition: A | Discharge status: Home / Self Care | Payer: Medicaid Other | Source: Ambulatory Visit | Attending: Pulmonary Disease | Admitting: Pulmonary Disease

## 2016-02-20 ENCOUNTER — Ambulatory Visit (INDEPENDENT_AMBULATORY_CARE_PROVIDER_SITE_OTHER): Payer: Medicaid Other | Admitting: Pulmonary Disease

## 2016-02-20 VITALS — BP 116/70 | HR 109 | Ht 64.0 in | Wt 134.0 lb

## 2016-02-20 DIAGNOSIS — R059 Cough, unspecified: Secondary | ICD-10-CM

## 2016-02-20 DIAGNOSIS — R918 Other nonspecific abnormal finding of lung field: Secondary | ICD-10-CM

## 2016-02-20 DIAGNOSIS — R05 Cough: Secondary | ICD-10-CM

## 2016-02-20 NOTE — Progress Notes (Signed)
Subjective:    Patient ID: Rodney Walker, male    DOB: [birth year], 35 y.o.   MRN: [id number]  Synopsis: Rodney Walker has HIV AIDS and was hospitalized in the fall of 2015 in the setting of a right upper lobe lung mass with mediastinal lymphadenopathy. The mass was visible on bronchoscopy and multiple endobronchial biopsies were taken as were cultures including AFB culture. Biopsy results showed nonspecific inflammatory changes in the AFB culture was negative. He ultimately underwent an right upper lobe decortication and lobectomy. Pathology showed necrotizing granuloma in the right upper lobe mass and mediastinal lymph nodes with negative special stains for infectious etiologies.  HPI Chief Complaint  Patient presents with  . Follow-up    pt c/o prod cough with clear mucus, sinus congestion, pnd X1 week. Pt arrived late, did not get cxr as scheduled.    Marja Kays says that his breathing has been good lately.  He has been trying to do breathing exercises. He doesn't feel dyspnea ever.  He coughs for the last week.  He has been coughing up clear mucus, this is rare for him.  He has some sinus congestion, worse in the morning.  He hasn't had itching eyes or a scratchy throat.  He had some abdominal cramping and sweats last month, but nothing in the last two weeks with this problem.  He said that this hasn't happened since.  He said the abdominal pain only lasted for 10 minutes.  He had some nausea with this.  He hasn't had a fever or chills or anything like this.  He has a friend with similar.   Past Medical History  Diagnosis Date  . Chest pain 2010  . Hyperlipidemia   . Coronary artery disease     SPONTANEOUS DISSECTION AND PLAQUE RUPTURE OF HIS LEFT ANTERIOR DESCENDING ARTERY  . MI (myocardial infarction) (HCC)   . HIV infection (HCC)   . Empyema lung (HCC)   . Emphysema lung (HCC)       Review of Systems     Objective:   Physical Exam Filed Vitals:   02/20/16 1446  BP:  116/70  Pulse: 109  Height: 5\' 4"  (1.626 m)  Weight: 134 lb (60.782 kg)  SpO2: 100%   RA  Gen: well appearing, no acute distress HEENT: NCAT, EOMi, OP clear,  PULM: CTA B CV: RRR, no mgr, no JVD AB: BS+, soft, nontender,  Ext: warm, no edema, no clubbing, no cyanosis Derm: no rash or skin breakdown Neuro: A&Ox4, MAEW       Assessment & Plan:   Lung mass From a symptom standpoint he is doing very well. Again, we believe that this was somehow like related to an atypical infection that I believe we had no documented proof of that and her cultures or special stains. There is no evidence of malignancy.  Plan: Repeat chest x-ray today to ensure that this is not increasing in size  Cough He has had a cough for the last 2 weeks associated with sinus congestion. I think this is likely a virus because of the sick contact. However, we've had multiple patients in clinic with allergic rhinitis because of the changes in the weather recently.  Plan: If symptoms do not go away in 2-3 days then use Zyrtec and a nasal steroid    Updated Medication List Outpatient Encounter Prescriptions as of 02/20/2016  Medication Sig  . albuterol (PROVENTIL HFA;VENTOLIN HFA) 108 (90 BASE) MCG/ACT inhaler Inhale 2 puffs into the lungs every  6 (six) hours as needed for wheezing or shortness of breath.  Marland Kitchen aspirin 325 MG tablet Take 650 mg by mouth daily as needed for fever.  . DULoxetine (CYMBALTA) 30 MG capsule Take 1 capsule (30 mg total) by mouth daily.  Marland Kitchen elvitegravir-cobicistat-emtricitabine-tenofovir (GENVOYA) 150-150-200-10 MG TABS tablet Take 1 tablet by mouth daily with breakfast.  . gabapentin (NEURONTIN) 300 MG capsule Take 1 capsule (300 mg total) by mouth 3 (three) times daily.  Marland Kitchen ibuprofen (ADVIL,MOTRIN) 200 MG tablet Take 200 mg by mouth every 6 (six) hours as needed (pain).  Marland Kitchen oxyCODONE-acetaminophen (PERCOCET/ROXICET) 5-325 MG tablet Take 1 tablet by mouth every 8 (eight) hours as needed for  severe pain.  . promethazine (PHENERGAN) 25 MG tablet Take 1 tablet (25 mg total) by mouth every 6 (six) hours as needed for nausea or vomiting.   No facility-administered encounter medications on file as of 02/20/2016.

## 2016-02-20 NOTE — Patient Instructions (Signed)
We will call you with the results of the chest x-ray If you sinus symptoms don't improve in 2-3 days then: Use Neil Med rinses with distilled water at least twice per day using the instructions on the package. 1/2 hour after using the [facility] Med rinse, use Nasacort two puffs in each nostril once per day.  Remember that the Nasacort can take 1-2 weeks to work after regular use. Use generic zyrtec (cetirizine) every day.  If this doesn't help, then stop taking it and use chlorpheniramine-phenylephrine combination tablets.  We will see you back in 6 months with a chest x-ray

## 2016-02-20 NOTE — Assessment & Plan Note (Signed)
He has had a cough for the last 2 weeks associated with sinus congestion. I think this is likely a virus because of the sick contact. However, we've had multiple patients in clinic with allergic rhinitis because of the changes in the weather recently.  Plan: If symptoms do not go away in 2-3 days then use Zyrtec and a nasal steroid

## 2016-02-20 NOTE — Assessment & Plan Note (Signed)
From a symptom standpoint he is doing very well. Again, we believe that this was somehow like related to an atypical infection that I believe we had no documented proof of that and her cultures or special stains. There is no evidence of malignancy.  Plan: Repeat chest x-ray today to ensure that this is not increasing in size

## 2016-02-21 NOTE — Progress Notes (Signed)
Quick Note:  Spoke with pt. In regards to his CXR. Pt. Voices understanding Nothing further needed ______

## 2016-03-03 ENCOUNTER — Ambulatory Visit: Payer: Medicaid Other | Attending: Family Medicine | Admitting: Family Medicine

## 2016-03-03 ENCOUNTER — Encounter: Payer: Self-pay | Admitting: Family Medicine

## 2016-03-03 VITALS — BP 111/71 | HR 87 | Temp 98.5°F | Resp 15 | Ht 64.0 in | Wt 133.4 lb

## 2016-03-03 DIAGNOSIS — R05 Cough: Secondary | ICD-10-CM | POA: Insufficient documentation

## 2016-03-03 DIAGNOSIS — Z79899 Other long term (current) drug therapy: Secondary | ICD-10-CM | POA: Insufficient documentation

## 2016-03-03 DIAGNOSIS — B20 Human immunodeficiency virus [HIV] disease: Secondary | ICD-10-CM | POA: Diagnosis not present

## 2016-03-03 DIAGNOSIS — Z21 Asymptomatic human immunodeficiency virus [HIV] infection status: Secondary | ICD-10-CM

## 2016-03-03 DIAGNOSIS — Z131 Encounter for screening for diabetes mellitus: Secondary | ICD-10-CM | POA: Diagnosis not present

## 2016-03-03 DIAGNOSIS — R0982 Postnasal drip: Secondary | ICD-10-CM | POA: Insufficient documentation

## 2016-03-03 DIAGNOSIS — R059 Cough, unspecified: Secondary | ICD-10-CM

## 2016-03-03 DIAGNOSIS — Z7982 Long term (current) use of aspirin: Secondary | ICD-10-CM | POA: Diagnosis not present

## 2016-03-03 LAB — POCT GLYCOSYLATED HEMOGLOBIN (HGB A1C): HEMOGLOBIN A1C: 5.2

## 2016-03-03 MED ORDER — CETIRIZINE HCL 10 MG PO TABS: 10.0000 mg | ORAL_TABLET | Freq: Every day | ORAL | Status: DC

## 2016-03-03 MED ORDER — FLUTICASONE PROPIONATE 50 MCG/ACT NA SUSP: 1.0000 | Freq: Every day | NASAL | Status: DC

## 2016-03-03 NOTE — Progress Notes (Signed)
Subjective:  Patient ID: Rodney Walker, male    DOB: [birth year]  Age: 35 y.o. MRN: [id number]  CC: Cough   HPI Rodney Walker is a 35 year old male with a history of HIV, CD4 count of 330, viral load of 20, history of right lung mass with mediastinal lymphadenopathy status post right upper lobe decortication and lobectomy (status post endobronchial biopsies with nonspecific inflammatory changes, AFB culture negative and pathology positive for necrotizing granuloma in right upper lobe mass, Menest, lymph nodes negative for infection). 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 Today he complains of a two-week history of cough productive of sputum which is clear and associated pain on the right chest wall with coughing. He denies fever, malaise, myalgias but does have some postnasal drip. Denies rhinorrhea or sinus tenderness.  Outpatient Prescriptions Prior to Visit  Medication Sig Dispense Refill  . elvitegravir-cobicistat-emtricitabine-tenofovir (GENVOYA) 150-150-200-10 MG TABS tablet Take 1 tablet by mouth daily with breakfast. 30 tablet 11  . ibuprofen (ADVIL,MOTRIN) 200 MG tablet Take 200 mg by mouth every 6 (six) hours as needed (pain).    . promethazine (PHENERGAN) 25 MG tablet Take 1 tablet (25 mg total) by mouth every 6 (six) hours as needed for nausea or vomiting. 30 tablet 0  . albuterol (PROVENTIL HFA;VENTOLIN HFA) 108 (90 BASE) MCG/ACT inhaler Inhale 2 puffs into the lungs every 6 (six) hours as needed for wheezing or shortness of breath. (Patient not taking: Reported on 03/03/2016) 1 Inhaler 1  . aspirin 325 MG tablet Take 650 mg by mouth daily as needed for fever. Reported on 03/03/2016    . DULoxetine (CYMBALTA) 30 MG capsule Take 1 capsule (30 mg total) by mouth daily. (Patient not taking: Reported on 03/03/2016) 30 capsule 11  . gabapentin (NEURONTIN) 300 MG capsule Take 1  capsule (300 mg total) by mouth 3 (three) times daily. (Patient not taking: Reported on 03/03/2016) 90 capsule 11  . oxyCODONE-acetaminophen (PERCOCET/ROXICET) 5-325 MG tablet Take 1 tablet by mouth every 8 (eight) hours as needed for severe pain. (Patient not taking: Reported on 03/03/2016) 30 tablet 0   No facility-administered medications prior to visit.    ROS Review of Systems  Constitutional: Negative for activity change and appetite change.  HENT: Positive for postnasal drip. Negative for sinus pressure and sore throat.   Eyes: Negative for visual disturbance.  Respiratory: Positive for cough. Negative for chest tightness and shortness of breath.   Cardiovascular: Negative for chest pain and leg swelling.  Gastrointestinal: Negative for abdominal pain, diarrhea, constipation and abdominal distention.  Endocrine: Negative.   Genitourinary: Negative for dysuria.  Musculoskeletal: Negative for myalgias and joint swelling.  Skin: Negative for rash.  Allergic/Immunologic: Negative.   Neurological: Negative for weakness, light-headedness and numbness.  Psychiatric/Behavioral: Negative for suicidal ideas and dysphoric mood.    Objective:  BP 111/71 mmHg  Pulse 87  Temp(Src) 98.5 F (36.9 C)  Resp 15  Ht  (1.626 m)  Wt 133 lb 6.4 oz (60.51 kg)  BMI 22.89 kg/m2  SpO2 96%  BP/Weight 03/03/2016 02/20/2016 01/09/2016  Systolic BP 111 116 112  Diastolic BP 71 70 72  Wt. (Lbs) 133.4 134 136.5  BMI 22.89 22.99 22.71      Physical Exam  Constitutional: He is oriented to person, place, and time. He appears well-developed and well-nourished.  Cardiovascular: Normal rate, normal heart sounds and intact distal pulses.  No murmur heard. Pulmonary/Chest: Effort normal and breath sounds normal. He has no wheezes. He has no rales. He exhibits no tenderness.  Abdominal: Soft. Bowel sounds are normal. He exhibits no distension and no mass. There is no tenderness.  Musculoskeletal: Normal  range of motion.  Neurological: He is alert and oriented to person, place, and time.     Assessment & Plan:   1. Diabetes mellitus screening A1c 5.2 - HgB A1c  2. HIV (human immunodeficiency virus infection) (HCC) CD4 count of 330, viral load of 20 Continue antiretrovirals therapy Keep appointment with infectious disease  3. Cough Recently completed treatment for pulmonary MAC and azithromycin and Rifabutin was stopped. Cough is likely related to allergic rhinitis and postnasal drip. Placed on oral antihistamine and steroid nasal spray.   Meds ordered this encounter  Medications  . fluticasone (FLONASE) 50 MCG/ACT nasal spray    Sig: Place 1 spray into both nostrils daily.    Dispense:  16 g    Refill:  6  . cetirizine (ZYRTEC) 10 MG tablet    Sig: Take 1 tablet (10 mg total) by mouth daily.    Dispense:  30 tablet    Refill:  3    Follow-up: Return in about 3 months (around 06/03/2016) for Follow-up on sinusitis .   Jaclyn Shaggy MD

## 2016-03-03 NOTE — Patient Instructions (Signed)

## 2016-03-03 NOTE — Progress Notes (Signed)
Complains of 2 weeks of productive cough No other symptoms

## 2016-04-07 ENCOUNTER — Other Ambulatory Visit: Payer: No Typology Code available for payment source

## 2016-04-13 ENCOUNTER — Other Ambulatory Visit: Payer: Medicaid Other

## 2016-04-13 ENCOUNTER — Other Ambulatory Visit: Payer: Self-pay | Admitting: Infectious Disease

## 2016-04-13 DIAGNOSIS — B20 Human immunodeficiency virus [HIV] disease: Secondary | ICD-10-CM

## 2016-04-13 LAB — CBC WITH DIFFERENTIAL/PLATELET
BASOS ABS: 60 {cells}/uL (ref 0–200)
BASOS PCT: 1 %
EOS ABS: 60 {cells}/uL (ref 15–500)
EOS PCT: 1 %
HCT: 37 % — ABNORMAL LOW (ref 38.5–50.0)
HEMOGLOBIN: 12.7 g/dL — AB (ref 13.2–17.1)
LYMPHS ABS: 1980 {cells}/uL (ref 850–3900)
Lymphocytes Relative: 33 %
MCH: 30.9 pg (ref 27.0–33.0)
MCHC: 34.3 g/dL (ref 32.0–36.0)
MCV: 90 fL (ref 80.0–100.0)
MPV: 9.6 fL (ref 7.5–12.5)
Monocytes Absolute: 420 cells/uL (ref 200–950)
Monocytes Relative: 7 %
NEUTROS ABS: 3480 {cells}/uL (ref 1500–7800)
Neutrophils Relative %: 58 %
PLATELETS: 235 10*3/uL (ref 140–400)
RBC: 4.11 MIL/uL — ABNORMAL LOW (ref 4.20–5.80)
RDW: 14.7 % (ref 11.0–15.0)
WBC: 6 10*3/uL (ref 3.8–10.8)

## 2016-04-14 LAB — COMPLETE METABOLIC PANEL WITH GFR
ALT: 9 U/L (ref 9–46)
AST: 15 U/L (ref 10–40)
Albumin: 3.5 g/dL — ABNORMAL LOW (ref 3.6–5.1)
Alkaline Phosphatase: 79 U/L (ref 40–115)
BUN: 10 mg/dL (ref 7–25)
CHLORIDE: 106 mmol/L (ref 98–110)
CO2: 26 mmol/L (ref 20–31)
Calcium: 8.7 mg/dL (ref 8.6–10.3)
Creat: 1.01 mg/dL (ref 0.60–1.35)
GLUCOSE: 111 mg/dL — AB (ref 65–99)
POTASSIUM: 3.4 mmol/L — AB (ref 3.5–5.3)
SODIUM: 141 mmol/L (ref 135–146)
Total Bilirubin: 0.3 mg/dL (ref 0.2–1.2)
Total Protein: 6.8 g/dL (ref 6.1–8.1)

## 2016-04-14 LAB — LIPID PANEL
CHOL/HDL RATIO: 4.2 ratio (ref <=5.0)
Cholesterol: 172 mg/dL (ref 125–200)
HDL: 41 mg/dL (ref >=40)
LDL Cholesterol: 115 mg/dL (ref <130)
TRIGLYCERIDES: 80 mg/dL (ref <150)
VLDL: 16 mg/dL (ref <30)

## 2016-04-14 LAB — HIV-1 RNA QUANT-NO REFLEX-BLD
HIV 1 RNA QUANT: 16068 {copies}/mL — AB (ref <20)
HIV-1 RNA QUANT, LOG: 4.21 {Log_copies}/mL — AB (ref <1.30)

## 2016-04-14 LAB — RPR

## 2016-04-15 ENCOUNTER — Other Ambulatory Visit: Payer: Self-pay | Admitting: Infectious Disease

## 2016-04-15 DIAGNOSIS — B20 Human immunodeficiency virus [HIV] disease: Secondary | ICD-10-CM

## 2016-04-15 LAB — T-HELPER CELL (CD4) - (RCID CLINIC ONLY)
CD4 % Helper T Cell: 21 % — ABNORMAL LOW (ref 33–55)
CD4 T Cell Abs: 420 /uL (ref 400–2700)

## 2016-04-21 ENCOUNTER — Encounter: Payer: Self-pay | Admitting: Internal Medicine

## 2016-04-21 ENCOUNTER — Ambulatory Visit (INDEPENDENT_AMBULATORY_CARE_PROVIDER_SITE_OTHER): Payer: Medicaid Other | Admitting: Internal Medicine

## 2016-04-21 VITALS — BP 115/74 | HR 79 | Temp 97.8°F | Ht 65.0 in | Wt 132.5 lb

## 2016-04-21 DIAGNOSIS — B20 Human immunodeficiency virus [HIV] disease: Secondary | ICD-10-CM

## 2016-04-21 DIAGNOSIS — J302 Other seasonal allergic rhinitis: Secondary | ICD-10-CM | POA: Diagnosis not present

## 2016-04-21 DIAGNOSIS — R0781 Pleurodynia: Secondary | ICD-10-CM | POA: Diagnosis not present

## 2016-04-21 NOTE — Progress Notes (Signed)
Patient ID: Rodney Walker, male   DOB: August 24, 1981, 35 y.o.   MRN: [id number]       Patient ID: Rodney Walker, male   DOB: 24-Mar-1981, 35 y.o.   MRN: [id number]  HPI 420/VL 16,068 in April 2017. He reports that since his brothers from [place] moved in with him he has had increasing financial stressors unable to focus on his health and missing doses for the last 8 wks. He reports missing up to 2-3 doses per week. Viral load in mid April now detectable! Genotype pending  Seasonal allergies are troubling him, doesn't feel that zyrtec is working, though now symptoms are better. He is not using flonase due to burning sensation  Feels that his ezcema is worsening  Outpatient Encounter Prescriptions as of 04/21/2016  Medication Sig  . elvitegravir-cobicistat-emtricitabine-tenofovir (GENVOYA) 150-150-200-10 MG TABS tablet Take 1 tablet by mouth daily with breakfast.  . fluticasone (FLONASE) 50 MCG/ACT nasal spray Place 1 spray into both nostrils daily.  Marland Kitchen ibuprofen (ADVIL,MOTRIN) 200 MG tablet Take 200 mg by mouth every 6 (six) hours as needed (pain).  Marland Kitchen albuterol (PROVENTIL HFA;VENTOLIN HFA) 108 (90 BASE) MCG/ACT inhaler Inhale 2 puffs into the lungs every 6 (six) hours as needed for wheezing or shortness of breath. (Patient not taking: Reported on 03/03/2016)  . aspirin 325 MG tablet Take 650 mg by mouth daily as needed for fever. Reported on 04/21/2016  . cetirizine (ZYRTEC) 10 MG tablet Take 1 tablet (10 mg total) by mouth daily. (Patient not taking: Reported on 04/21/2016)  . DULoxetine (CYMBALTA) 30 MG capsule Take 1 capsule (30 mg total) by mouth daily. (Patient not taking: Reported on 03/03/2016)  . gabapentin (NEURONTIN) 300 MG capsule Take 1 capsule (300 mg total) by mouth 3 (three) times daily. (Patient not taking: Reported on 03/03/2016)  . oxyCODONE-acetaminophen (PERCOCET/ROXICET) 5-325 MG tablet Take 1 tablet by mouth every 8 (eight) hours as needed for severe pain. (Patient not  taking: Reported on 03/03/2016)  . promethazine (PHENERGAN) 25 MG tablet Take 1 tablet (25 mg total) by mouth every 6 (six) hours as needed for nausea or vomiting. (Patient not taking: Reported on 04/21/2016)   No facility-administered encounter medications on file as of 04/21/2016.     Patient Active Problem List   Diagnosis Date Noted  . Cough 02/20/2016  . Anemia 05/03/2015  . Lung abscess (HCC) 04/22/2015  . Sepsis due to pneumonia (HCC) 04/17/2015  . Sepsis (HCC) 04/17/2015  . Bullous emphysema (HCC)   . Lobar pneumonia (HCC)   . Hyperkalemia 04/16/2015  . Empyema lung (HCC) 04/16/2015  . PNA (pneumonia) 04/16/2015  . HIV (human immunodeficiency virus infection) (HCC)   . Tobacco abuse 03/21/2015  . Lung mass 08/31/2014  . 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 Myalgia 04/23/2014  . Pharyngitis 03/31/2014  . Hyponatremia 03/31/2014  . Fever 03/30/2014  . AIDS (HCC) 03/22/2014  . Thrush 03/22/2014  . CAP (community acquired pneumonia) 03/22/2014  . CAD (coronary artery disease) 11/13/2011     Health Maintenance Due  Topic Date Due  . TETANUS/TDAP  10/18/1999     Review of Systems + seasonal allergies, nasal congestion. Intermittent chest wall discomfort from prior thoracotomy. 10 point ros is negative Physical Exam   BP 115/74 mmHg  Pulse 79  Temp(Src) 97.8 F (36.6 C) (Oral)  Ht 5\' 5"  (1.651 m)  Wt 132 lb 8 oz (60.102 kg)  BMI 22.05 kg/m2 Physical Exam  Constitutional: He is oriented to person, place, and time. He appears  well-developed and well-nourished. No distress.  HENT:  Mouth/Throat: Oropharynx is clear and moist. No oropharyngeal exudate.  Cardiovascular: Normal rate, regular rhythm and normal heart sounds. Exam reveals no gallop and no friction rub.  No murmur heard.  Pulmonary/Chest: Effort normal and breath sounds normal. No respiratory distress. He has no wheezes.  Abdominal: Soft. Bowel sounds are normal. He exhibits no distension. There is no  tenderness.  Lymphadenopathy:  He has no cervical adenopathy.  Neurological: He is alert and oriented to person, place, and time.  Skin: Skin is warm and dry. No rash noted. No erythema.  Psychiatric: He has a normal mood and affect. His behavior is normal.    Lab Results  Component Value Date   CD4TCELL 21* 04/13/2016   Lab Results  Component Value Date   CD4TABS 420 04/13/2016   CD4TABS 330* 01/09/2016   CD4TABS 540 10/16/2015   Lab Results  Component Value Date   HIV1RNAQUANT 16068* 04/13/2016   No results found for: HEPBSAB No results found for: RPR  CBC Lab Results  Component Value Date   WBC 6.0 04/13/2016   RBC 4.11* 04/13/2016   HGB 12.7* 04/13/2016   HCT 37.0* 04/13/2016   PLT 235 04/13/2016   MCV 90.0 04/13/2016   MCH 30.9 04/13/2016   MCHC 34.3 04/13/2016   RDW 14.7 04/13/2016   LYMPHSABS 1980 04/13/2016   MONOABS 420 04/13/2016   EOSABS 60 04/13/2016   BASOSABS 60 04/13/2016   BMET Lab Results  Component Value Date   NA 141 04/13/2016   K 3.4* 04/13/2016   CL 106 04/13/2016   CO2 26 04/13/2016   GLUCOSE 111* 04/13/2016   BUN 10 04/13/2016   CREATININE 1.01 04/13/2016   CALCIUM 8.7 04/13/2016   GFRNONAA >89 04/13/2016   GFRAA >89 04/13/2016     Assessment and Plan  For his seasonal allergies, we will D/c flonase. Will likely place him on PI based regimen. Unclear if still can do integrase based regimen  hiv disease = concern for new resistance. Will have him stop genovoya. Await to have genotype return to start new regimen. Will have him come back in 7 days  Seasonal allergies = can try claritin but may improve as less pollen in the air

## 2016-04-22 MED FILL — HYDROCODON-APAP 5-325: 5-325 | 2 days supply | Qty: 12 | Fill #0

## 2016-04-27 LAB — HIV-1 GENOTYPING (RTI,PI,IN INHBTR): HIV-1 Genotype: DETECTED — AB

## 2016-04-28 ENCOUNTER — Telehealth: Payer: Self-pay | Admitting: Infectious Disease

## 2016-04-28 ENCOUNTER — Ambulatory Visit: Payer: Medicaid Other | Admitting: Pharmacist Clinician (PhC)/ Clinical Pharmacy Specialist

## 2016-04-28 DIAGNOSIS — B20 Human immunodeficiency virus [HIV] disease: Secondary | ICD-10-CM

## 2016-04-28 MED ORDER — EMTRICITABINE-TENOFOVIR AF 200-25 MG PO TABS: 1.0000 | ORAL_TABLET | Freq: Every day | ORAL | Status: DC

## 2016-04-28 MED ORDER — DARUNAVIR-COBICISTAT 800-150 MG PO TABS: 1.0000 | ORAL_TABLET | Freq: Every day | ORAL | Status: DC

## 2016-04-28 NOTE — Progress Notes (Signed)
Patient ID: Rodney Walker, male   DOB: 02/23/1981, 35 y.o.   MRN: [id number] HPI: Rodney Walker is a 35 y.o. male who is here for his HIV pharmacy visit after his genotype.   Allergies: No Known Allergies  Vitals:    Past Medical History: Past Medical History  Diagnosis Date  . Chest pain 2010  . Hyperlipidemia   . Coronary artery disease     SPONTANEOUS DISSECTION AND PLAQUE RUPTURE OF HIS LEFT ANTERIOR DESCENDING ARTERY  . MI (myocardial infarction) (HCC)   . HIV infection (HCC)   . Empyema lung (HCC)   . Emphysema lung (HCC)     Social History: Social History   Social History  . Marital Status: Single    Spouse Name: N/A  . Number of Children: N/A  . Years of Education: N/A   Occupational History  . unemployed    Social History Main Topics  . Smoking status: Former Smoker -- 0.25 packs/day for 17 years    Types: Cigarettes    Quit date: 03/23/2015  . Smokeless tobacco: Never Used     Comment: has cut back significantly, 3 cigs/day.  03/19/15  . Alcohol Use: No  . Drug Use: 2.00 per week    Special: Marijuana  . Sexual Activity:    Partners: Male     Comment: given condoms   Other Topics Concern  . Not on file   Social History Narrative    Previous Regimen: Darrin Luis, DTG/TRV  Current Regimen: Off  Labs: HIV 1 RNA QUANT (copies/mL)  Date Value  04/13/2016 16068*  01/09/2016 <20  10/16/2015 37*   CD4 T CELL ABS (/uL)  Date Value  04/13/2016 420  01/09/2016 330*  10/16/2015 540   HEPATITIS B SURFACE AG (no units)  Date Value  03/22/2014 NEGATIVE   HCV AB (no units)  Date Value  03/22/2014 NEGATIVE    CrCl: Estimated Creatinine Clearance: 86.8 mL/min (by C-G formula based on Cr of 1.01).  Lipids:    Component Value Date/Time   CHOL 172 04/13/2016 1609   TRIG 80 04/13/2016 1609   HDL 41 04/13/2016 1609   CHOLHDL 4.2 04/13/2016 1609   VLDL 16 04/13/2016 1609   LDLCALC 115 04/13/2016 1609   HIV Genotype  Composite Data Genotype Dates:   Mutations in Bold impact drug susceptibility RT Mutations K103N, F227L  PI Mutations none  Integrase Mutations none   Interpretation of Genotype Data per [employer] HIV Database Nucleoside RTIs  None   Non-Nucleoside RTIs  efavirenz (EFV) High-Level Resistance etravirine (ETR) Susceptible nevirapine (NVP) High-Level Resistance rilpivirine (RPV) Susceptible   Protease Inhibitors  None   Integrase Inhibitors  None    Assessment: Rodney Walker recently saw Dr. Drue Second for his HIV visit on 4/25. He admitted to missing 2-3 doses a week due to stress. He was taking Genvoya at the time so there was fear of resistance to that regimen. He was asked to stop while we sent of a genotype including integrase. It has come back with only k103n. All integrase are sens. He said he will take his meds consistently now since he is doing much better with stress. Due to his hx of compliance, we are going to put him on a PI based regimen instead of continuing his [place]. Explained to him about the change and meds must be taken with food. He is going to try to get the meds today. He is no longer on MAC treatment. He has an appt with Dr. Drue Second  at the beginning of June so we can do the labs then.   Recommendations:  Dc Genvoya completely Prezcobix 1 PO qday Descovy 1 PO qday F/u Dr. Drue Second in June  Clide Cliff, PharmD Clinical Infectious Disease Pharmacist [facility] 04/28/2016, 2:46 PM

## 2016-04-28 NOTE — Telephone Encounter (Signed)
Rodney Walker  Patients geotype came to me he has a k103 that I can see

## 2016-04-29 MED FILL — PREZCOBIX 800 MG-150 MG TAB: 800-150 | 30 days supply | Qty: 30 | Fill #0

## 2016-04-29 MED FILL — DESCOVY 200-25 MG TABS: 200-25 | 30 days supply | Qty: 30 | Fill #0

## 2016-05-16 ENCOUNTER — Encounter (HOSPITAL_COMMUNITY): Payer: Self-pay | Admitting: Emergency Medicine

## 2016-05-16 ENCOUNTER — Emergency Department (HOSPITAL_COMMUNITY)
Admission: EM | Admit: 2016-05-16 | Discharge: 2016-05-16 | Disposition: A | Discharge status: Home / Self Care | Payer: Medicaid Other | Attending: Emergency Medicine | Admitting: Emergency Medicine

## 2016-05-16 ENCOUNTER — Emergency Department (HOSPITAL_COMMUNITY): Payer: Medicaid Other

## 2016-05-16 DIAGNOSIS — Z87891 Personal history of nicotine dependence: Secondary | ICD-10-CM | POA: Diagnosis not present

## 2016-05-16 DIAGNOSIS — Z79899 Other long term (current) drug therapy: Secondary | ICD-10-CM | POA: Insufficient documentation

## 2016-05-16 DIAGNOSIS — I2510 Atherosclerotic heart disease of native coronary artery without angina pectoris: Secondary | ICD-10-CM | POA: Diagnosis not present

## 2016-05-16 DIAGNOSIS — H9209 Otalgia, unspecified ear: Secondary | ICD-10-CM | POA: Insufficient documentation

## 2016-05-16 DIAGNOSIS — J029 Acute pharyngitis, unspecified: Secondary | ICD-10-CM | POA: Diagnosis present

## 2016-05-16 DIAGNOSIS — Z7982 Long term (current) use of aspirin: Secondary | ICD-10-CM | POA: Diagnosis not present

## 2016-05-16 DIAGNOSIS — J40 Bronchitis, not specified as acute or chronic: Secondary | ICD-10-CM | POA: Diagnosis not present

## 2016-05-16 DIAGNOSIS — J439 Emphysema, unspecified: Secondary | ICD-10-CM | POA: Insufficient documentation

## 2016-05-16 DIAGNOSIS — I252 Old myocardial infarction: Secondary | ICD-10-CM | POA: Diagnosis not present

## 2016-05-16 DIAGNOSIS — Z21 Asymptomatic human immunodeficiency virus [HIV] infection status: Secondary | ICD-10-CM | POA: Insufficient documentation

## 2016-05-16 DIAGNOSIS — E785 Hyperlipidemia, unspecified: Secondary | ICD-10-CM | POA: Insufficient documentation

## 2016-05-16 HISTORY — DX: Immunodeficiency, unspecified: D84.9

## 2016-05-16 LAB — RAPID STREP SCREEN (MED CTR MEBANE ONLY): Streptococcus, Group A Screen (Direct): NEGATIVE

## 2016-05-16 MED ORDER — BENZONATATE 100 MG PO CAPS: 100.0000 mg | ORAL_CAPSULE | Freq: Three times a day (TID) | ORAL | Status: DC

## 2016-05-16 MED ORDER — AZITHROMYCIN 250 MG PO TABS: ORAL_TABLET | ORAL | Status: DC

## 2016-05-16 NOTE — ED Notes (Signed)
Patient d/c'd from continuous pulse oximetry and blood pressure cuff; patient getting dressed to be discharged  home 

## 2016-05-16 NOTE — ED Provider Notes (Signed)
CSN: [id number]     Arrival date & time 05/16/16  0425 History   First MD Initiated Contact with Patient 05/16/16 0518     Chief Complaint  Patient presents with  . Sore Throat  . Cough     (Consider location/radiation/quality/duration/timing/severity/associated sxs/prior Treatment) HPI Comments: Patient with history of HIV with CD4 count of 420 in 4/17, empyema last year requiring VATS procedure -- presents with complaints of cold symptoms for the past 1 week. Patient has had chills but no fevers. He complains of mild ear pain. He has had a sore throat and a productive cough. No vomiting or diarrhea. Patient is taking Mucinex without relief. Reports recent sick exposure. Patient states that he came to the emergency department tonight because he has to go to church tomorrow and work on Monday and does not have any other time to come seek treatment. The onset of this condition was acute. The course is constant. Aggravating factors: none. Alleviating factors: none.     Patient is a 35 y.o. male presenting with pharyngitis and cough. The history is provided by the patient.  Sore Throat Associated symptoms include chills, congestion, coughing and a sore throat. Pertinent negatives include no abdominal pain, fatigue, fever, headaches, myalgias, nausea, rash or vomiting.  Cough Associated symptoms: chills, ear pain and sore throat   Associated symptoms: no fever, no headaches, no myalgias, no rash, no rhinorrhea and no wheezing     Past Medical History  Diagnosis Date  . Chest pain 2010  . Hyperlipidemia   . Coronary artery disease     SPONTANEOUS DISSECTION AND PLAQUE RUPTURE OF HIS LEFT ANTERIOR DESCENDING ARTERY  . MI (myocardial infarction) (HCC)   . HIV infection (HCC)   . Empyema lung (HCC)   . Emphysema lung (HCC)   . Immune deficiency disorder [facility])    Past Surgical History  Procedure Laterality Date  . Cardiac catheterization  06/24/2009    EF 60%  . Cardiac catheterization   06/17/2009    EF 45%. ANTERIOR HYPOKINESIS  . US echocardiography  12/09/2009    EF 55-60%  . Video bronchoscopy Bilateral 09/05/2014    Procedure: VIDEO BRONCHOSCOPY WITH FLUORO;  Surgeon: Merwyn Katos, MD;  Location: [facility] ENDOSCOPY;  Service: Cardiopulmonary;  Laterality: Bilateral;  . Video bronchoscopy Bilateral 04/10/2015    Procedure: VIDEO BRONCHOSCOPY WITH FLUORO;  Surgeon: Lupita Leash, MD;  Location: [facility] ENDOSCOPY;  Service: Cardiopulmonary;  Laterality: Bilateral;  . Video assisted thoracoscopy (vats)/ lobectomy Right 04/23/2015    Procedure: VIDEO ASSISTED THORACOSCOPY (VATS)/RIGHT upper  and right middle LOBECTOMY;  Surgeon: Kerin Perna, MD;  Location: [facility] OR;  Service: Thoracic;  Laterality: Right;   Family History  Problem Relation Age of Onset  . Diabetes Mother   . Liver cancer Father    Social History  Substance Use Topics  . Smoking status: Former Smoker -- 0.25 packs/day for 17 years    Types: Cigarettes    Quit date: 03/23/2015  . Smokeless tobacco: Never Used     Comment: has cut back significantly, 3 cigs/day.  03/19/15  . Alcohol Use: No    Review of Systems  Constitutional: Positive for chills. Negative for fever and fatigue.  HENT: Positive for congestion, ear pain and sore throat. Negative for rhinorrhea and sinus pressure.   Eyes: Negative for redness.  Respiratory: Positive for cough. Negative for wheezing.   Gastrointestinal: Negative for nausea, vomiting, abdominal pain and diarrhea.  Genitourinary: Negative for dysuria.  Musculoskeletal: Negative  for myalgias and neck stiffness.  Skin: Negative for rash.  Neurological: Negative for headaches.  Hematological: Negative for adenopathy.      Allergies  Review of patient's allergies indicates no known allergies.  Home Medications   Prior to Admission medications   Medication Sig Start Date End Date Taking? Authorizing Provider  albuterol (PROVENTIL HFA;VENTOLIN HFA) 108 (90 BASE) MCG/ACT  inhaler Inhale 2 puffs into the lungs every 6 (six) hours as needed for wheezing or shortness of breath. Patient not taking: Reported on 03/03/2016 07/25/14   Randall Hiss, MD  aspirin 325 MG tablet Take 650 mg by mouth daily as needed for fever. Reported on 04/21/2016    Historical Provider, MD  cetirizine (ZYRTEC) 10 MG tablet Take 1 tablet (10 mg total) by mouth daily. Patient not taking: Reported on 04/21/2016 03/03/16   Jaclyn Shaggy, MD  darunavir-cobicistat (PREZCOBIX) 800-150 MG tablet Take 1 tablet by mouth daily. Swallow whole. Do NOT crush, break or chew tablets. Take with food. 04/28/16   Judyann Munson, MD  DULoxetine (CYMBALTA) 30 MG capsule Take 1 capsule (30 mg total) by mouth daily. Patient not taking: Reported on 03/03/2016 10/28/15   Judyann Munson, MD  emtricitabine-tenofovir AF (DESCOVY) 200-25 MG tablet Take 1 tablet by mouth daily. 04/28/16   Judyann Munson, MD  gabapentin (NEURONTIN) 300 MG capsule Take 1 capsule (300 mg total) by mouth 3 (three) times daily. Patient not taking: Reported on 03/03/2016 10/16/15   Judyann Munson, MD  ibuprofen (ADVIL,MOTRIN) 200 MG tablet Take 200 mg by mouth every 6 (six) hours as needed (pain).    Historical Provider, MD  oxyCODONE-acetaminophen (PERCOCET/ROXICET) 5-325 MG tablet Take 1 tablet by mouth every 8 (eight) hours as needed for severe pain. Patient not taking: Reported on 03/03/2016 01/09/16   Judyann Munson, MD  promethazine (PHENERGAN) 25 MG tablet Take 1 tablet (25 mg total) by mouth every 6 (six) hours as needed for nausea or vomiting. Patient not taking: Reported on 04/21/2016 11/28/15   Judyann Munson, MD   BP 108/73 mmHg  Pulse 80  Temp(Src) 98.4 F (36.9 C) (Oral)  SpO2 97% Physical Exam  Constitutional: He appears well-developed and well-nourished.  HENT:  Head: Normocephalic and atraumatic.  Right Ear: Tympanic membrane, external ear and ear canal normal.  Left Ear: Tympanic membrane, external ear and ear canal normal.  Nose:  Nose normal. No mucosal edema or rhinorrhea.  Mouth/Throat: Uvula is midline and mucous membranes are normal. Mucous membranes are not dry. No trismus in the jaw. No uvula swelling. Posterior oropharyngeal erythema present. No oropharyngeal exudate, posterior oropharyngeal edema or tonsillar abscesses.  Eyes: Conjunctivae are normal. Right eye exhibits no discharge. Left eye exhibits no discharge.  Neck: Normal range of motion. Neck supple.  Cardiovascular: Normal rate, regular rhythm and normal heart sounds.   Pulmonary/Chest: Effort normal and breath sounds normal. No respiratory distress. He has no wheezes. He has no rales.  Abdominal: Soft. There is no tenderness.  Neurological: He is alert.  Skin: Skin is warm and dry.  Psychiatric: He has a normal mood and affect.  Nursing note and vitals reviewed.   ED Course  Procedures (including critical care time) Labs Review Labs Reviewed  RAPID STREP SCREEN (NOT AT [facility])    5:26 AM Patient seen and examined. Work-up initiated.   Vital signs reviewed and are as follows: BP 108/73 mmHg  Pulse 80  Temp(Src) 98.4 F (36.9 C) (Oral)  SpO2 97%  6:00 AM Handoff to [employer]  at shift change. Will follow-up CXR and strep.   MDM   Final diagnoses:  Bronchitis   Pending completion of work-up.     Renne Crigler, PA-C 05/16/16 0600  Zadie Rhine, MD 05/16/16 0800

## 2016-05-16 NOTE — ED Provider Notes (Signed)
Signed out by [employer], PA-C. Plan for follow-up on rapid strep, chest x-ray. Rapid strep and chest x-ray are negative for any acute pathology. Patient discharged. Filed Vitals:   05/16/16 0512  BP: 108/73  Pulse: 80  Temp: 98.4 F (36.9 C)     [address] PA-C 05/16/16 [id number]  Zadie Rhine, MD 05/16/16 [phone]

## 2016-05-16 NOTE — ED Notes (Signed)
Pt reports that he "thinks has a cold".  Reports of sore throat, cough, trouble breathing.  Believes he was exposed to someone who was sick.

## 2016-05-16 NOTE — Discharge Instructions (Signed)
Please read and follow all provided instructions.  Your diagnoses today include:  1. Bronchitis    Tests performed today include:  Chest x-ray - does not show any pneumonia  Strep test - negative  Vital signs. See below for your results today.   Medications prescribed:   Azithromycin - antibiotic for respiratory infection  You have been prescribed an antibiotic medicine: take the entire course of medicine even if you are feeling better. Stopping early can cause the antibiotic not to work.   Tessalon Perles - cough suppressant medication  Take any prescribed medications only as directed.  Home care instructions:  Follow any educational materials contained in this packet.  Follow-up instructions: Please follow-up with your primary care provider in the next 3 days for further evaluation of your symptoms and a recheck if you are not feeling better.   Return instructions:   Please return to the Emergency Department if you experience worsening symptoms.  Please return with worsening wheezing, shortness of breath, or difficulty breathing.  Return with persistent fever above 101F.   Please return if you have any other emergent concerns.  Additional Information:  Your vital signs today were: BP 108/73 mmHg   Pulse 80   Temp(Src) 98.4 F (36.9 C) (Oral)   SpO2 97% If your blood pressure (BP) was elevated above 135/85 this visit, please have this repeated by your doctor within one month. --------------

## 2016-05-18 LAB — CULTURE, GROUP A STREP (THRC)

## 2016-05-28 ENCOUNTER — Ambulatory Visit (INDEPENDENT_AMBULATORY_CARE_PROVIDER_SITE_OTHER): Payer: Medicaid Other | Admitting: Internal Medicine

## 2016-05-28 ENCOUNTER — Encounter: Payer: Self-pay | Admitting: Internal Medicine

## 2016-05-28 VITALS — BP 122/79 | HR 80 | Temp 98.0°F | Ht 64.0 in | Wt 132.0 lb

## 2016-05-28 DIAGNOSIS — B20 Human immunodeficiency virus [HIV] disease: Secondary | ICD-10-CM | POA: Diagnosis not present

## 2016-05-28 LAB — CBC WITH DIFFERENTIAL/PLATELET
BASOS ABS: 43 {cells}/uL (ref 0–200)
Basophils Relative: 1 %
EOS PCT: 1 %
Eosinophils Absolute: 43 cells/uL (ref 15–500)
HCT: 38.7 % (ref 38.5–50.0)
Hemoglobin: 13.1 g/dL — ABNORMAL LOW (ref 13.2–17.1)
LYMPHS PCT: 48 %
Lymphs Abs: 2064 cells/uL (ref 850–3900)
MCH: 30.5 pg (ref 27.0–33.0)
MCHC: 33.9 g/dL (ref 32.0–36.0)
MCV: 90 fL (ref 80.0–100.0)
MONOS PCT: 13 %
MPV: 9.8 fL (ref 7.5–12.5)
Monocytes Absolute: 559 cells/uL (ref 200–950)
NEUTROS ABS: 1591 {cells}/uL (ref 1500–7800)
Neutrophils Relative %: 37 %
PLATELETS: 238 10*3/uL (ref 140–400)
RBC: 4.3 MIL/uL (ref 4.20–5.80)
RDW: 16.5 % — AB (ref 11.0–15.0)
WBC: 4.3 10*3/uL (ref 3.8–10.8)

## 2016-05-28 LAB — COMPLETE METABOLIC PANEL WITH GFR
ALT: 10 U/L (ref 9–46)
AST: 18 U/L (ref 10–40)
Albumin: 3.9 g/dL (ref 3.6–5.1)
Alkaline Phosphatase: 85 U/L (ref 40–115)
BUN: 11 mg/dL (ref 7–25)
CHLORIDE: 104 mmol/L (ref 98–110)
CO2: 27 mmol/L (ref 20–31)
Calcium: 9.1 mg/dL (ref 8.6–10.3)
Creat: 1.09 mg/dL (ref 0.60–1.35)
GFR, EST NON AFRICAN AMERICAN: 87 mL/min (ref >=60)
GFR, Est African American: >89 mL/min (ref >=60)
GLUCOSE: 85 mg/dL (ref 65–99)
POTASSIUM: 4 mmol/L (ref 3.5–5.3)
SODIUM: 137 mmol/L (ref 135–146)
TOTAL PROTEIN: 7.2 g/dL (ref 6.1–8.1)
Total Bilirubin: 0.4 mg/dL (ref 0.2–1.2)

## 2016-05-28 MED FILL — DESCOVY 200-25 MG TABS: 200-25 | 30 days supply | Qty: 30 | Fill #1 | Status: TO

## 2016-05-28 MED FILL — PREZCOBIX 800 MG-150 MG TAB: 800-150 | 30 days supply | Qty: 30 | Fill #1 | Status: TO

## 2016-05-28 NOTE — Progress Notes (Signed)
Patient ID: Rodney Walker, male   DOB: May 23, 1981, 35 y.o.   MRN: [id number]       Patient ID: Rodney Walker, male   DOB: 10/14/1981, 35 y.o.   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 He was changed to prezcobix/descovy and has been taking his medications more regularly  Outpatient Encounter Prescriptions as of 05/28/2016  Medication Sig  . cetirizine (ZYRTEC) 10 MG tablet Take 10 mg by mouth daily.  . darunavir-cobicistat (PREZCOBIX) 800-150 MG tablet Take 1 tablet by mouth daily. Swallow whole. Do NOT crush, break or chew tablets. Take with food.  Marland Kitchen emtricitabine-tenofovir AF (DESCOVY) 200-25 MG tablet Take 1 tablet by mouth daily.  Marland Kitchen aspirin 325 MG tablet Take 650 mg by mouth daily as needed for fever. Reported on 05/28/2016  . azithromycin (ZITHROMAX Z-PAK) 250 MG tablet Take two tablets PO on day 1 and one tablet PO days 2-5 (Patient not taking: Reported on 05/28/2016)  . benzonatate (TESSALON) 100 MG capsule Take 1 capsule (100 mg total) by mouth every 8 (eight) hours. (Patient not taking: Reported on 05/28/2016)  . fluticasone (FLONASE) 50 MCG/ACT nasal spray SHAKE LIQUID AND USE 1 SPRAY IN EACH NOSTRIL DAILY  . ibuprofen (ADVIL,MOTRIN) 200 MG tablet Take 200 mg by mouth every 6 (six) hours as needed (pain). Reported on 05/28/2016   No facility-administered encounter medications on file as of 05/28/2016.     Patient Active Problem List   Diagnosis Date Noted  . Cough 02/20/2016  . Anemia 05/03/2015  . Lung abscess (HCC) 04/22/2015  . Sepsis due to pneumonia (HCC) 04/17/2015  . Sepsis (HCC) 04/17/2015  . Bullous emphysema (HCC)   . Lobar pneumonia (HCC)   . Hyperkalemia 04/16/2015  . Empyema lung (HCC) 04/16/2015  . PNA (pneumonia) 04/16/2015  . HIV (human immunodeficiency virus  infection) (HCC)   . Tobacco abuse 03/21/2015  . Lung mass 08/31/2014  . 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 Myalgia 04/23/2014  . Pharyngitis 03/31/2014  . Hyponatremia 03/31/2014  . Fever 03/30/2014  . AIDS (HCC) 03/22/2014  . Thrush 03/22/2014  . CAP (community acquired pneumonia) 03/22/2014  . CAD (coronary artery disease) 11/13/2011     Health Maintenance Due  Topic Date Due  . TETANUS/TDAP  10/18/1999     Review of Systems occ chest wall pain. Otherwise 10 point ros is negative Physical Exam   BP 122/79 mmHg  Pulse 80  Temp(Src) 98 F (36.7 C) (Oral)  Ht  (1.626 m)  Wt 132 lb (59.875 kg)  BMI 22.65 kg/m2 Physical Exam  Constitutional: He is oriented to person, place, and time. He appears well-developed and well-nourished. No distress.  HENT:  Mouth/Throat: Oropharynx is clear and moist. No oropharyngeal exudate.  Cardiovascular: Normal rate, regular rhythm and normal heart sounds. Exam reveals no gallop and no friction rub.  No murmur heard.  Pulmonary/Chest: Effort normal and breath sounds normal. No respiratory distress. He has no wheezes.  Abdominal: Soft. Bowel sounds are normal. He exhibits no distension. There is no tenderness.  Lymphadenopathy:  He has no cervical adenopathy.  Neurological: He is alert and oriented to person, place, and time.  Skin: Skin is warm and dry. No rash noted. No erythema.  Psychiatric: He has a normal mood and affect. His behavior is normal.    Lab Results  Component Value Date   CD4TCELL 21* 04/13/2016   Lab Results  Component Value Date   CD4TABS 420 04/13/2016   CD4TABS 330* 01/09/2016   CD4TABS 540 10/16/2015   Lab Results  Component Value Date   HIV1RNAQUANT 16068* 04/13/2016   No results found for: HEPBSAB No results found for: RPR  CBC Lab Results  Component Value Date   WBC 6.0 04/13/2016   RBC 4.11* 04/13/2016   HGB 12.7* 04/13/2016   HCT 37.0* 04/13/2016   PLT 235 04/13/2016   MCV 90.0  04/13/2016   MCH 30.9 04/13/2016   MCHC 34.3 04/13/2016   RDW 14.7 04/13/2016   LYMPHSABS 1980 04/13/2016   MONOABS 420 04/13/2016   EOSABS 60 04/13/2016   BASOSABS 60 04/13/2016   BMET Lab Results  Component Value Date   NA 141 04/13/2016   K 3.4* 04/13/2016   CL 106 04/13/2016   CO2 26 04/13/2016   GLUCOSE 111* 04/13/2016   BUN 10 04/13/2016   CREATININE 1.01 04/13/2016   CALCIUM 8.7 04/13/2016   GFRNONAA >89 04/13/2016   GFRAA >89 04/13/2016   Lab Results  Component Value Date   HIV1RNAQUANT 250* 05/28/2016    Assessment and Plan  hiv disease = started on new regimen. 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 If still having discomfort at next appt, can do a trial of lidocaine patch/ cream.   Seasonal allergies = Asked him to stop using fluticasone

## 2016-05-29 LAB — T-HELPER CELL (CD4) - (RCID CLINIC ONLY)
CD4 T CELL ABS: 400 /uL (ref 400–2700)
CD4 T CELL HELPER: 19 % — AB (ref 33–55)

## 2016-06-01 LAB — HIV-1 RNA QUANT-NO REFLEX-BLD
HIV 1 RNA Quant: 250 copies/mL — ABNORMAL HIGH (ref <20)
HIV-1 RNA QUANT, LOG: 2.4 {Log_copies}/mL — AB (ref <1.30)

## 2016-06-13 ENCOUNTER — Encounter (HOSPITAL_COMMUNITY): Payer: Self-pay | Admitting: *Deleted

## 2016-06-13 ENCOUNTER — Emergency Department (HOSPITAL_COMMUNITY)
Admission: EM | Admit: 2016-06-13 | Discharge: 2016-06-13 | Disposition: A | Discharge status: Home / Self Care | Payer: Medicaid Other | Attending: Emergency Medicine | Admitting: Emergency Medicine

## 2016-06-13 DIAGNOSIS — I252 Old myocardial infarction: Secondary | ICD-10-CM | POA: Insufficient documentation

## 2016-06-13 DIAGNOSIS — H578 Other specified disorders of eye and adnexa: Secondary | ICD-10-CM | POA: Diagnosis present

## 2016-06-13 DIAGNOSIS — I2510 Atherosclerotic heart disease of native coronary artery without angina pectoris: Secondary | ICD-10-CM | POA: Diagnosis not present

## 2016-06-13 DIAGNOSIS — B309 Viral conjunctivitis, unspecified: Secondary | ICD-10-CM | POA: Diagnosis not present

## 2016-06-13 DIAGNOSIS — H109 Unspecified conjunctivitis: Secondary | ICD-10-CM

## 2016-06-13 DIAGNOSIS — Z21 Asymptomatic human immunodeficiency virus [HIV] infection status: Secondary | ICD-10-CM | POA: Insufficient documentation

## 2016-06-13 DIAGNOSIS — Z7982 Long term (current) use of aspirin: Secondary | ICD-10-CM | POA: Diagnosis not present

## 2016-06-13 DIAGNOSIS — Z87891 Personal history of nicotine dependence: Secondary | ICD-10-CM | POA: Diagnosis not present

## 2016-06-13 MED ORDER — FLUORESCEIN SODIUM 1 MG OP STRP
1.0000 | ORAL_STRIP | Freq: Once | OPHTHALMIC | Status: AC
  Administered 2016-06-13: 1 via OPHTHALMIC
  Filled 2016-06-13: qty 1

## 2016-06-13 MED ORDER — TETRACAINE HCL 0.5 % OP SOLN
1.0000 [drp] | Freq: Once | OPHTHALMIC | Status: AC
  Administered 2016-06-13: 1 [drp] via OPHTHALMIC
  Filled 2016-06-13: qty 2

## 2016-06-13 MED ORDER — POLYMYXIN B-TRIMETHOPRIM 10000-0.1 UNIT/ML-% OP SOLN: 2.0000 [drp] | OPHTHALMIC | Status: DC

## 2016-06-13 NOTE — ED Notes (Signed)
The pt has itching and drainage of his lt eye since Thursday and he has a bump on his rt eye

## 2016-06-13 NOTE — ED Provider Notes (Signed)
CSN: [id number]     Arrival date & time 06/13/16  0556 History   First MD Initiated Contact with Patient 06/13/16 [phone]     Chief Complaint  Patient presents with  . Eye Problem     (Consider location/radiation/quality/duration/timing/severity/associated sxs/prior Treatment) HPI   Patient is a 35 year old male with a history of CAD, MI, HIV who presents the ED with left eye redness and discharge for 2 days. Patient notices redness on Thursday and Friday morning his eye was crusted shut. Eye is irritated although not painful. He has not tried anything for this. Nothing makes it better nothing makes it worse. Patient denies any contact of his eye with bodily fluids. He denies fever, chills, nausea, vomiting, abdominal pain, visual disturbance, headache.  Past Medical History  Diagnosis Date  . Chest pain 2010  . Hyperlipidemia   . Coronary artery disease     SPONTANEOUS DISSECTION AND PLAQUE RUPTURE OF HIS LEFT ANTERIOR DESCENDING ARTERY  . MI (myocardial infarction) (HCC)   . HIV infection (HCC)   . Empyema lung (HCC)   . Emphysema lung (HCC)   . Immune deficiency disorder [facility])    Past Surgical History  Procedure Laterality Date  . Cardiac catheterization  06/24/2009    EF 60%  . Cardiac catheterization  06/17/2009    EF 45%. ANTERIOR HYPOKINESIS  . US echocardiography  12/09/2009    EF 55-60%  . Video bronchoscopy Bilateral 09/05/2014    Procedure: VIDEO BRONCHOSCOPY WITH FLUORO;  Surgeon: Merwyn Katos, MD;  Location: [facility] ENDOSCOPY;  Service: Cardiopulmonary;  Laterality: Bilateral;  . Video bronchoscopy Bilateral 04/10/2015    Procedure: VIDEO BRONCHOSCOPY WITH FLUORO;  Surgeon: Lupita Leash, MD;  Location[facility] ENDOSCOPY;  Service: Cardiopulmonary;  Laterality: Bilateral;  . Video assisted thoracoscopy (vats)/ lobectomy Right 04/23/2015    Procedure: VIDEO ASSISTED THORACOSCOPY (VATS)/RIGHT upper  and right middle LOBECTOMY;  Surgeon: Kerin Perna, MD;  Location: [facility] OR;   Service: Thoracic;  Laterality: Right;   Family History  Problem Relation Age of Onset  . Diabetes Mother   . Liver cancer Father    Social History  Substance Use Topics  . Smoking status: Former Smoker -- 0.25 packs/day for 17 years    Types: Cigarettes    Quit date: 03/23/2015  . Smokeless tobacco: Never Used     Comment: has cut back significantly, 3 cigs/day.  03/19/15  . Alcohol Use: No    Review of Systems  Constitutional: Negative for fever and chills.  HENT: Positive for congestion.   Eyes: Positive for discharge and redness. Negative for photophobia, pain and visual disturbance.  Gastrointestinal: Negative for nausea and vomiting.  Neurological: Negative for dizziness and headaches.      Allergies  Review of patient's allergies indicates no known allergies.  Home Medications   Prior to Admission medications   Medication Sig Start Date End Date Taking? Authorizing Provider  aspirin 325 MG tablet Take 650 mg by mouth daily as needed for fever. Reported on 05/28/2016   Yes Historical Provider, MD  cetirizine (ZYRTEC) 10 MG tablet Take 10 mg by mouth daily.   Yes Historical Provider, MD  darunavir-cobicistat (PREZCOBIX) 800-150 MG tablet Take 1 tablet by mouth daily. Swallow whole. Do NOT crush, break or chew tablets. Take with food. 04/28/16  Yes Judyann Munson, MD  emtricitabine-tenofovir AF (DESCOVY) 200-25 MG tablet Take 1 tablet by mouth daily. 04/28/16  Yes Judyann Munson, MD  fluticasone (FLONASE) 50 MCG/ACT nasal spray SHAKE LIQUID AND  USE 1 SPRAY IN EACH NOSTRIL DAILY 05/01/16  Yes Historical Provider, MD  ibuprofen (ADVIL,MOTRIN) 200 MG tablet Take 200 mg by mouth every 6 (six) hours as needed (pain). Reported on 05/28/2016   Yes Historical Provider, MD  azithromycin (ZITHROMAX Z-PAK) 250 MG tablet Take two tablets PO on day 1 and one tablet PO days 2-5 Patient not taking: Reported on 05/28/2016 05/16/16   Renne Crigler, PA-C  benzonatate (TESSALON) 100 MG capsule Take 1  capsule (100 mg total) by mouth every 8 (eight) hours. Patient not taking: Reported on 05/28/2016 05/16/16   Renne Crigler, PA-C  trimethoprim-polymyxin b (POLYTRIM) ophthalmic solution Place 2 drops into the left eye every 4 (four) hours. For 7-10 days 06/13/16   Lyonel Morejon L Kadie Balestrieri, PA   BP 100/66 mmHg  Pulse 76  Temp(Src) 97.8 F (36.6 C) (Oral)  Resp 18  Ht  (1.651 m)  Wt 61.236 kg  BMI 22.47 kg/m2  SpO2 100% Physical Exam  Constitutional: He appears well-developed and well-nourished. No distress.  HENT:  Head: Normocephalic and atraumatic.  Eyes: EOM are normal. Pupils are equal, round, and reactive to light. Left eye exhibits chemosis, discharge and exudate. Right conjunctiva is not injected. Right conjunctiva has no hemorrhage. Left conjunctiva is injected. Left conjunctiva has no hemorrhage.  Left eye: No fluorescein uptake noted on cornea, IOC 19 mmHg  Neck: Normal range of motion. No tracheal deviation present.  Cardiovascular: Normal rate.   Pulmonary/Chest: Effort normal.  Musculoskeletal: Normal range of motion.  Lymphadenopathy:    He has no cervical adenopathy.  Neurological: He is alert. Coordination normal.  Psychiatric: He has a normal mood and affect. His behavior is normal.    ED Course  Procedures (including critical care time) Labs Review Labs Reviewed - No data to display  Imaging Review No results found. I have personally reviewed and evaluated these images and lab results as part of my medical decision-making.   EKG Interpretation None      MDM   Final diagnoses:  Conjunctivitis of left eye   Viral conjunctivitis  Patient presentation consistent with bacterial conjunctivitis with purulent discharge. NO corneal abrasions, entrapment, consensual photophobia, or dendritic staining with fluorescein study.  Presentation non-concerning for iritis, corneal abrasions, or HSV.  Antibiotics are indicated and patient will be prescribed Polytrim at  discharge.  Personal hygiene and frequent handwashing discussed.  Patient advised to followup with ophthalmologist if symptoms persist or worsen in any way including vision change or purulent discharge.  Patient verbalizes understanding and is agreeable with discharge.     Jerre Simon, PA 06/13/16 1610  Alvira Monday, MD 06/14/16 [phone]

## 2016-06-13 NOTE — ED Notes (Signed)
Lt eye20/30 rt eye 20/30

## 2016-06-13 NOTE — ED Notes (Signed)
The pts  Lt eye is sl swelling with irritation

## 2016-06-13 NOTE — Discharge Instructions (Signed)
Follow-up with your primary care provider or an ophthalmologist on Monday to be reevaluated. Use the eyedrops as prescribed for 7-10 days. Use proper hand hygiene as this could be transmitted to your right eye easily.  Return to emergency department if you experience eye pain, worsening discharge or redness, visual changes to include blurred vision or loss of visual fields, headache, nausea, vomiting, fever.  Bacterial Conjunctivitis Bacterial conjunctivitis, commonly called pink eye, is an inflammation of the clear membrane that covers the white part of the eye (conjunctiva). The inflammation can also happen on the underside of the eyelids. The blood vessels in the conjunctiva become inflamed, causing the eye to become red or pink. Bacterial conjunctivitis may spread easily from one eye to another and from person to person (contagious).  CAUSES  Bacterial conjunctivitis is caused by bacteria. The bacteria may come from your own skin, your upper respiratory tract, or from someone else with bacterial conjunctivitis. SYMPTOMS  The normally white color of the eye or the underside of the eyelid is usually pink or red. The pink eye is usually associated with irritation, tearing, and some sensitivity to light. Bacterial conjunctivitis is often associated with a thick, yellowish discharge from the eye. The discharge may turn into a crust on the eyelids overnight, which causes your eyelids to stick together. If a discharge is present, there may also be some blurred vision in the affected eye. DIAGNOSIS  Bacterial conjunctivitis is diagnosed by your caregiver through an eye exam and the symptoms that you report. Your caregiver looks for changes in the surface tissues of your eyes, which may point to the specific type of conjunctivitis. A sample of any discharge may be collected on a cotton-tip swab if you have a severe case of conjunctivitis, if your cornea is affected, or if you keep getting repeat infections  that do not respond to treatment. The sample will be sent to a lab to see if the inflammation is caused by a bacterial infection and to see if the infection will respond to antibiotic medicines. TREATMENT   Bacterial conjunctivitis is treated with antibiotics. Antibiotic eyedrops are most often used. However, antibiotic ointments are also available. Antibiotics pills are sometimes used. Artificial tears or eye washes may ease discomfort. HOME CARE INSTRUCTIONS   To ease discomfort, apply a cool, clean washcloth to your eye for 10-20 minutes, 3-4 times a day.  Gently wipe away any drainage from your eye with a warm, wet washcloth or a cotton ball.  Wash your hands often with soap and water. Use paper towels to dry your hands.  Do not share towels or washcloths. This may spread the infection.  Change or wash your pillowcase every day.  You should not use eye makeup until the infection is gone.  Do not operate machinery or drive if your vision is blurred.  Stop using contact lenses. Ask your caregiver how to sterilize or replace your contacts before using them again. This depends on the type of contact lenses that you use.  When applying medicine to the infected eye, do not touch the edge of your eyelid with the eyedrop bottle or ointment tube. SEEK IMMEDIATE MEDICAL CARE IF:   Your infection has not improved within 3 days after beginning treatment.  You had yellow discharge from your eye and it returns.  You have increased eye pain.  Your eye redness is spreading.  Your vision becomes blurred.  You have a fever or persistent symptoms for more than 2-3 days.  You  have a fever and your symptoms suddenly get worse.  You have facial pain, redness, or swelling. MAKE SURE YOU:   Understand these instructions.  Will watch your condition.  Will get help right away if you are not doing well or get worse.   This information is not intended to replace advice given to you by your  health care provider. Make sure you discuss any questions you have with your health care provider.   Document Released: 12/14/2005 Document Revised: 01/04/2015 Document Reviewed: 05/16/2012 Elsevier Interactive Patient Education [employer]

## 2016-06-18 MED FILL — POLYMYXIN B-TMP EYE DROPS: 10000-0.1 | 10 days supply | Qty: 10 | Fill #0

## 2016-06-29 ENCOUNTER — Telehealth: Payer: Self-pay | Admitting: *Deleted

## 2016-06-29 NOTE — Telephone Encounter (Signed)
Left message asking patient to call back regarding his Flonase. Andree Coss, RN

## 2016-06-29 NOTE — Telephone Encounter (Signed)
-----   Message from Judyann Munson, MD sent at 06/24/2016 11:02 PM EDT ----- Can you call him to make sure he is not using flonase? If he is using it, tell him we will find another inhaler to use

## 2016-07-27 ENCOUNTER — Ambulatory Visit (INDEPENDENT_AMBULATORY_CARE_PROVIDER_SITE_OTHER): Payer: Medicaid Other | Admitting: Internal Medicine

## 2016-07-27 ENCOUNTER — Encounter: Payer: Self-pay | Admitting: Internal Medicine

## 2016-07-27 VITALS — BP 124/72 | HR 103 | Temp 98.8°F | Wt 134.0 lb

## 2016-07-27 DIAGNOSIS — J302 Other seasonal allergic rhinitis: Secondary | ICD-10-CM

## 2016-07-27 MED ORDER — BECLOMETHASONE DIPROP MONOHYD 42 MCG/SPRAY NA SUSP: 2.0000 | Freq: Two times a day (BID) | NASAL | Status: DC

## 2016-08-16 ENCOUNTER — Other Ambulatory Visit: Payer: Self-pay | Admitting: Family Medicine

## 2016-09-04 ENCOUNTER — Other Ambulatory Visit: Payer: Self-pay | Admitting: Internal Medicine

## 2016-09-04 DIAGNOSIS — B20 Human immunodeficiency virus [HIV] disease: Secondary | ICD-10-CM

## 2016-09-04 NOTE — Progress Notes (Signed)
RFV: follow up for HIV disease  Patient ID: Rodney Walker, male   DOB: Jan 10, 1981, 35 y.o.   MRN: [id number]  HPI Rodney Walker is 35 yo M with HIV disease, CD 4 count of 400/VL 250. He reports that he has been taking descovy-prezcobix more consistently. His VL is trending down. He had temporarily stopped due to family stressors. He states that he continues to do better. No  Recent illnesses.  Outpatient Encounter Prescriptions as of 07/27/2016  Medication Sig  . aspirin 325 MG tablet Take 650 mg by mouth daily as needed for fever. Reported on 05/28/2016  . cetirizine (ZYRTEC) 10 MG tablet Take 10 mg by mouth daily.  . darunavir-cobicistat (PREZCOBIX) 800-150 MG tablet Take 1 tablet by mouth daily. Swallow whole. Do NOT crush, break or chew tablets. Take with food.  Marland Kitchen. emtricitabine-tenofovir AF (DESCOVY) 200-25 MG tablet Take 1 tablet by mouth daily.  Marland Kitchen. ibuprofen (ADVIL,MOTRIN) 200 MG tablet Take 200 mg by mouth every 6 (six) hours as needed (pain). Reported on 05/28/2016  . trimethoprim-polymyxin b (POLYTRIM) ophthalmic solution Place 2 drops into the left eye every 4 (four) hours. For 7-10 days   Facility-Administered Encounter Medications as of 07/27/2016  Medication  . beclomethasone (BECONASE-AQ) nasal spray 2 spray     Patient Active Problem List   Diagnosis Date Noted  . Cough 02/20/2016  . Anemia 05/03/2015  . Lung abscess (HCC) 04/22/2015  . Sepsis due to pneumonia (HCC) 04/17/2015  . Sepsis (HCC) 04/17/2015  . Bullous emphysema (HCC)   . Lobar pneumonia (HCC)   . Hyperkalemia 04/16/2015  . Empyema lung (HCC) 04/16/2015  . PNA (pneumonia) 04/16/2015  . HIV (human immunodeficiency virus infection) (HCC)   . Tobacco abuse 03/21/2015  . Lung mass 08/31/2014  . 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 Myalgia 04/23/2014  . Pharyngitis 03/31/2014  . Hyponatremia 03/31/2014  . Fever 03/30/2014  . AIDS (HCC) 03/22/2014  . Thrush 03/22/2014  . CAP (community  acquired pneumonia) 03/22/2014  . CAD (coronary artery disease) 11/13/2011     Health Maintenance Due  Topic Date Due  . TETANUS/TDAP  10/18/1999  . INFLUENZA VACCINE  07/28/2016     Review of Systems  Physical Exam   BP 124/72   Pulse (!) 103   Temp 98.8 F (37.1 C) (Oral)   Wt 134 lb (60.8 kg)   BMI 22.30 kg/m  Physical Exam  Constitutional: He is oriented to person, place, and time. He appears well-developed and well-nourished. No distress.  HENT:  Mouth/Throat: Oropharynx is clear and moist. No oropharyngeal exudate.  Cardiovascular: Normal rate, regular rhythm and normal heart sounds. Exam reveals no gallop and no friction rub.  No murmur heard.  Pulmonary/Chest: Effort normal and breath sounds normal. No respiratory distress. He has no wheezes.  Abdominal: Soft. Bowel sounds are normal. He exhibits no distension. There is no tenderness.  Lymphadenopathy:  He has no cervical adenopathy.  Neurological: He is alert and oriented to person, place, and time.  Skin: Skin is warm and dry. No rash noted. No erythema.  Psychiatric: He has a normal mood and affect. His behavior is normal.    Lab Results  Component Value Date   CD4TCELL 19 (L) 05/28/2016   Lab Results  Component Value Date   CD4TABS 400 05/28/2016   CD4TABS 420 04/13/2016   CD4TABS 330 (L) 01/09/2016   Lab Results  Component Value Date   HIV1RNAQUANT 250 (H) 05/28/2016   No results found for: HEPBSAB No  results found for: RPR  CBC Lab Results  Component Value Date   WBC 4.3 05/28/2016   RBC 4.30 05/28/2016   HGB 13.1 (L) 05/28/2016   HCT 38.7 05/28/2016   PLT 238 05/28/2016   MCV 90.0 05/28/2016   MCH 30.5 05/28/2016   MCHC 33.9 05/28/2016   RDW 16.5 (H) 05/28/2016   LYMPHSABS 2,064 05/28/2016   MONOABS 559 05/28/2016   EOSABS 43 05/28/2016   BASOSABS 43 05/28/2016   BMET Lab Results  Component Value Date   NA 137 05/28/2016   K 4.0 05/28/2016   CL 104 05/28/2016   CO2 27  05/28/2016   GLUCOSE 85 05/28/2016   BUN 11 05/28/2016   CREATININE 1.09 05/28/2016   CALCIUM 9.1 05/28/2016   GFRNONAA 87 05/28/2016   GFRAA >89 05/28/2016     Assessment and Plan   hiv disease = still has some viremia but anticipate that it will be undetectable at next lab draw as long as he continues to take medications daily. 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

## 2016-09-07 ENCOUNTER — Telehealth: Payer: Self-pay | Admitting: *Deleted

## 2016-09-07 NOTE — Telephone Encounter (Signed)
-----   Message from Judyann Munson, MD sent at 09/04/2016  8:19 PM EDT ----- Can we have him come in to get viral load this week. Thx. I will place orders

## 2016-09-07 NOTE — Telephone Encounter (Signed)
Per Dr Drue Second called the patient and advised him to call the office and when he returns the call will have him come to the office for viral load.

## 2016-09-09 ENCOUNTER — Other Ambulatory Visit: Payer: Medicaid Other

## 2016-09-09 ENCOUNTER — Other Ambulatory Visit: Payer: Self-pay | Admitting: Pharmacist Clinician (PhC)/ Clinical Pharmacy Specialist

## 2016-09-09 DIAGNOSIS — B20 Human immunodeficiency virus [HIV] disease: Secondary | ICD-10-CM

## 2016-09-09 MED ORDER — OLOPATADINE HCL 0.6 % NA SOLN: NASAL | 5 refills | Status: DC

## 2016-09-09 NOTE — Progress Notes (Unsigned)
[name] stopped by to say that he still has allergy issue with cetirizine. Since he has medicaid, it won't cover beconase. We'll send nasal antihistamine instead (Patanase). If it's sill not resolved then we'll go to third line steroids (Nasonex)

## 2016-09-10 LAB — HIV-1 RNA QUANT-NO REFLEX-BLD
HIV 1 RNA Quant: 258 copies/mL — ABNORMAL HIGH (ref <20)
HIV-1 RNA QUANT, LOG: 2.41 {Log_copies}/mL — AB (ref <1.30)

## 2016-09-16 ENCOUNTER — Ambulatory Visit (INDEPENDENT_AMBULATORY_CARE_PROVIDER_SITE_OTHER): Payer: Medicare Other | Admitting: Pharmacist Clinician (PhC)/ Clinical Pharmacy Specialist

## 2016-09-16 ENCOUNTER — Telehealth: Payer: Self-pay | Admitting: Pharmacist Clinician (PhC)/ Clinical Pharmacy Specialist

## 2016-09-16 DIAGNOSIS — B20 Human immunodeficiency virus [HIV] disease: Secondary | ICD-10-CM

## 2016-09-16 NOTE — Progress Notes (Signed)
Patient ID: Rodney Walker, male   DOB: 1981-12-19, 35 y.o.   MRN: [id number] HPI: Rodney Walker is a 35 y.o. male who is here to see pharmacy to discuss compliance and VL issue.   Allergies: No Known Allergies  Vitals:    Past Medical History: Past Medical History:  Diagnosis Date  . Chest pain 2010  . Coronary artery disease    SPONTANEOUS DISSECTION AND PLAQUE RUPTURE OF HIS LEFT ANTERIOR DESCENDING ARTERY  . Emphysema lung (HCC)   . Empyema lung (HCC)   . HIV infection (HCC)   . Hyperlipidemia   . Immune deficiency disorder (HCC)   . MI (myocardial infarction) [facility])     Social History: Social History   Social History  . Marital status: Single    Spouse name: N/A  . Number of children: N/A  . Years of education: N/A   Occupational History  . unemployed    Social History Main Topics  . Smoking status: Former Smoker    Packs/day: 0.25    Years: 17.00    Types: Cigarettes    Quit date: 03/23/2015  . Smokeless tobacco: Never Used     Comment: has cut back significantly, 3 cigs/day.  03/19/15  . Alcohol use No  . Drug use:     Frequency: 2.0 times per week    Types: Marijuana  . Sexual activity: Not Currently    Partners: Male     Comment: given condoms   Other Topics Concern  . Not on file   Social History Narrative  . No narrative on file    Previous Regimen:   Current Regimen: Prezcobix/Descovy  Labs: HIV 1 RNA Quant (copies/mL)  Date Value  09/09/2016 258 (H)  05/28/2016 250 (H)  04/13/2016 16,068 (H)   CD4 T Cell Abs (/uL)  Date Value  05/28/2016 400  04/13/2016 420  01/09/2016 330 (L)   Hepatitis B Surface Ag (no units)  Date Value  03/22/2014 NEGATIVE   HCV Ab (no units)  Date Value  03/22/2014 NEGATIVE    CrCl: CrCl cannot be calculated (Unknown ideal weight.).  Lipids:    Component Value Date/Time   CHOL 172 04/13/2016 1609   TRIG 80 04/13/2016 1609   HDL 41 04/13/2016 1609   CHOLHDL 4.2  04/13/2016 1609   VLDL 16 04/13/2016 1609   LDLCALC 115 04/13/2016 1609    Assessment: Rodney Walker recently saw me for the issue with his allgery nasal spray. Since he is on a cobicistat, inhaled steroids is out. We put him on olopatadine instead. We drew an HIV VL that day to see if he is now undetectable. It still came back at >200. Brought him in to discuss if there are any issues. Apparently, due to his work schedule, he is not taking his ART on a consistent schedule (AM, sometimes PM or at night). He denied missing doses. Stressed to him several times that he can't do that and it could potentially lead to resistance. He has been keeping his ART in his car to avoid not having meds when he works. Advised him not to do it because with the temp change, it could affect efficacy of this drug. Instead, I gave him a keychain pillbox to keep 2 doses of ART in there rather than keeping it in his car. Told him that it's ok not to be an exact time everyday but 1-3 hrs is ok to do. Since his VL is <500, we are going to draw a [employer]  today to make sure that he doesn't have any further archive resistance.   Recommendations:  Stressed to take meds on a consistent schedule HIV genosure today  Clide Cliffham, Minh Quang, PharmD, BCPS, AAHIVP, CPP Clinical Infectious Disease Pharmacist [facility] 09/16/2016, 11:11 AM

## 2016-09-16 NOTE — Telephone Encounter (Signed)
Called Rodney Walker today to see if we can bring him back to discuss this issue. He is adamant on the phone that he has been taking his meds. If this is true, we can do a genosure. He is going to call me back after lunch to see when is the best time for him to come.

## 2016-09-16 NOTE — Telephone Encounter (Signed)
genosure makes sense

## 2016-09-16 NOTE — Patient Instructions (Signed)
Must take meds around the same time everyday

## 2016-09-16 NOTE — Telephone Encounter (Signed)
-----   Message from Judyann Munson, MD sent at 09/11/2016 12:04 PM EDT ----- Can you bring him into clinic for more counseling. I feel like he should have been surpressed by now if he is taking his medications regularly

## 2016-10-07 ENCOUNTER — Telehealth: Payer: Self-pay | Admitting: Pharmacist Clinician (PhC)/ Clinical Pharmacy Specialist

## 2016-10-07 NOTE — Telephone Encounter (Signed)
Genosure under media tab

## 2016-10-08 ENCOUNTER — Ambulatory Visit (INDEPENDENT_AMBULATORY_CARE_PROVIDER_SITE_OTHER): Payer: Medicare Other | Admitting: Pulmonary Disease

## 2016-10-08 ENCOUNTER — Encounter: Payer: Self-pay | Admitting: Pulmonary Disease

## 2016-10-08 DIAGNOSIS — J301 Allergic rhinitis due to pollen: Secondary | ICD-10-CM | POA: Diagnosis not present

## 2016-10-08 DIAGNOSIS — Z23 Encounter for immunization: Secondary | ICD-10-CM

## 2016-10-08 DIAGNOSIS — R918 Other nonspecific abnormal finding of lung field: Secondary | ICD-10-CM | POA: Diagnosis not present

## 2016-10-08 DIAGNOSIS — J309 Allergic rhinitis, unspecified: Secondary | ICD-10-CM | POA: Insufficient documentation

## 2016-10-08 NOTE — Assessment & Plan Note (Signed)
He has really been struggling with allergic rhinitis despite antihistamine treatment and his nasal antihistamine agent. Apparently we are unable to use nasal steroids due to his HIV regimen. I wonder whether or not he would be a candidate for immunotherapy. Exline Plan: Refer to allergy  Follow-up with [place] on an as-needed basis

## 2016-10-08 NOTE — Progress Notes (Signed)
Subjective:    Patient ID: Rodney Walker, male    DOB: 1981-08-23, 35 y.o.   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 The mass was visible on bronchoscopy and multiple endobronchial biopsies were taken as were cultures including AFB culture. Biopsy results showed nonspecific inflammatory changes in the AFB culture was negative. He ultimately underwent an right upper lobe decortication and lobectomy. Pathology showed necrotizing granuloma in the right upper lobe mass and mediastinal lymph nodes with negative special stains for infectious etiologies.  HPI Chief Complaint  Patient presents with  . Follow-up    seasonal allergies for months was given nasal spray but no relief    No recent respiratory infections or trouble breathing.  He is staying active with work.  No dyspnea with work.  His strength is fine.   He continues to sing.  He doesn't feel like he is limited by breathing.  For the last several months he has been struggling with allergic rhinitis. Specifically, he has sinus congestion, postnasal drip and sneezing. He says that the cetirizine was unhelpful. He is not able to take fluticasone. He states he nasal spray right now which is a nasal antihistamine and he says he is taking it regularly. However, he continues to have significant symptoms and produces mucus and has sinus congestion on a daily basis.  Past Medical History:  Diagnosis Date  . Chest pain 2010  . Coronary artery disease    SPONTANEOUS DISSECTION AND PLAQUE RUPTURE OF HIS LEFT ANTERIOR DESCENDING ARTERY  . Emphysema lung (HCC)   . Empyema lung (HCC)   . HIV infection (HCC)   . Hyperlipidemia   . Immune deficiency disorder (HCC)   . MI (myocardial infarction)       Review of Systems     Objective:   Physical Exam Vitals:   10/08/16 1115  BP: 122/80  Pulse: 73    SpO2: 99%  Weight: 135 lb 6.4 oz (61.4 kg)  Height: 5\' 5"  (1.651 m)   RA  Gen: well appearing, no acute distress HEENT: NCAT, EOMi, OP clear,  PULM: CTA B CV: RRR, no mgr, no JVD AB: BS+, soft, nontender,  Ext: warm, no edema, no clubbing, no cyanosis Derm: no rash or skin breakdown Neuro: A&Ox4, MAEW  Chart reviewed from recent infectious disease pharmacy visit, apparently because of his medications he cannot take a nasal or inhaled steroid.     Assessment & Plan:   Lung mass Resolved  Allergic rhinitis He has really been struggling with allergic rhinitis despite antihistamine treatment and his nasal antihistamine agent. Apparently we are unable to use nasal steroids due to his HIV regimen. I wonder whether or not he would be a candidate for immunotherapy. Exline Plan: Refer to allergy  Follow-up with [place] on an as-needed basis   Updated Medication List Outpatient Encounter Prescriptions as of 10/08/2016  Medication Sig  . aspirin 325 MG tablet Take 650 mg by mouth daily as needed for fever. Reported on 05/28/2016  . cetirizine (ZYRTEC) 10 MG tablet Take 10 mg by mouth daily.  . darunavir-cobicistat (PREZCOBIX) 800-150 MG tablet Take 1 tablet by mouth daily. Swallow whole. Do NOT crush, break or chew tablets. Take with food.  Marland Kitchen emtricitabine-tenofovir AF (DESCOVY) 200-25 MG tablet Take 1 tablet by mouth daily.  Marland Kitchen ibuprofen (ADVIL,MOTRIN) 200 MG tablet Take 200 mg by  mouth every 6 (six) hours as needed (pain). Reported on 05/28/2016  . Olopatadine HCl (PATANASE) 0.6 % SOLN Two sprays into each nostril twice daily  . trimethoprim-polymyxin b (POLYTRIM) ophthalmic solution Place 2 drops into the left eye every 4 (four) hours. For 7-10 days   Facility-Administered Encounter Medications as of 10/08/2016  Medication  . beclomethasone (BECONASE-AQ) nasal spray 2 spray

## 2016-10-08 NOTE — Assessment & Plan Note (Signed)
Resolved

## 2016-10-08 NOTE — Patient Instructions (Signed)
We will refer you to an allergist for evaluation of your allergic rhinitis We will see you back on an as-needed basis

## 2016-10-14 ENCOUNTER — Other Ambulatory Visit: Payer: Medicaid Other

## 2016-10-21 ENCOUNTER — Telehealth: Payer: Self-pay | Admitting: Infectious Disease

## 2016-10-21 NOTE — Telephone Encounter (Signed)
GENOSURE ARCHIVE BACK lots of options since no NRTI mutations

## 2016-10-28 ENCOUNTER — Encounter: Payer: Self-pay | Admitting: Internal Medicine

## 2016-10-28 ENCOUNTER — Ambulatory Visit (INDEPENDENT_AMBULATORY_CARE_PROVIDER_SITE_OTHER): Payer: Medicare Other | Admitting: Internal Medicine

## 2016-10-28 VITALS — BP 124/69 | HR 84 | Temp 97.7°F | Wt 134.0 lb

## 2016-10-28 DIAGNOSIS — Z23 Encounter for immunization: Secondary | ICD-10-CM

## 2016-10-28 DIAGNOSIS — R350 Frequency of micturition: Secondary | ICD-10-CM | POA: Diagnosis not present

## 2016-10-28 DIAGNOSIS — B20 Human immunodeficiency virus [HIV] disease: Secondary | ICD-10-CM | POA: Diagnosis not present

## 2016-10-28 LAB — CBC WITH DIFFERENTIAL/PLATELET
BASOS ABS: 0 {cells}/uL (ref 0–200)
Basophils Relative: 0 %
EOS ABS: 73 {cells}/uL (ref 15–500)
EOS PCT: 1 %
HCT: 42.3 % (ref 38.5–50.0)
Hemoglobin: 14.4 g/dL (ref 13.2–17.1)
LYMPHS PCT: 35 %
Lymphs Abs: 2555 cells/uL (ref 850–3900)
MCH: 32.1 pg (ref 27.0–33.0)
MCHC: 34 g/dL (ref 32.0–36.0)
MCV: 94.4 fL (ref 80.0–100.0)
MONOS PCT: 8 %
MPV: 9.7 fL (ref 7.5–12.5)
Monocytes Absolute: 584 cells/uL (ref 200–950)
Neutro Abs: 4088 cells/uL (ref 1500–7800)
Neutrophils Relative %: 56 %
PLATELETS: 269 10*3/uL (ref 140–400)
RBC: 4.48 MIL/uL (ref 4.20–5.80)
RDW: 14 % (ref 11.0–15.0)
WBC: 7.3 10*3/uL (ref 3.8–10.8)

## 2016-10-28 MED ORDER — TETANUS-DIPHTH-ACELL PERTUSSIS 5-2.5-18.5 LF-MCG/0.5 IM SUSP: 0.5000 mL | Freq: Once | INTRAMUSCULAR | Status: DC

## 2016-10-28 NOTE — Progress Notes (Signed)
RFV: HIv disease follow up Patient ID: Rodney Walker, male   DOB: 1981/11/19, 35 y.o.   MRN: [id number]  HPI   35yo M with HIV disease, currently on descovy-prezcobix. He reports doing well will adherence. No recent illness  ROS: He notices having +urgency to urinate. No pain.   Outpatient Encounter Prescriptions as of 10/28/2016  Medication Sig  . aspirin 325 MG tablet Take 650 mg by mouth daily as needed for fever. Reported on 05/28/2016  . darunavir-cobicistat (PREZCOBIX) 800-150 MG tablet Take 1 tablet by mouth daily. Swallow whole. Do NOT crush, break or chew tablets. Take with food.  Marland Kitchen emtricitabine-tenofovir AF (DESCOVY) 200-25 MG tablet Take 1 tablet by mouth daily.  Marland Kitchen ibuprofen (ADVIL,MOTRIN) 200 MG tablet Take 200 mg by mouth every 6 (six) hours as needed (pain). Reported on 05/28/2016  . cetirizine (ZYRTEC) 10 MG tablet Take 10 mg by mouth daily.  . Olopatadine HCl (PATANASE) 0.6 % SOLN Two sprays into each nostril twice daily (Patient not taking: Reported on 10/28/2016)  . trimethoprim-polymyxin b (POLYTRIM) ophthalmic solution Place 2 drops into the left eye every 4 (four) hours. For 7-10 days (Patient not taking: Reported on 10/28/2016)   Facility-Administered Encounter Medications as of 10/28/2016  Medication  . beclomethasone (BECONASE-AQ) nasal spray 2 spray     Patient Active Problem List   Diagnosis Date Noted  . Allergic rhinitis 10/08/2016  . Cough 02/20/2016  . Anemia 05/03/2015  . Lung abscess (HCC) 04/22/2015  . Sepsis due to pneumonia (HCC) 04/17/2015  . Sepsis (HCC) 04/17/2015  . Bullous emphysema (HCC)   . Lobar pneumonia (HCC)   . Hyperkalemia 04/16/2015  . Empyema lung (HCC) 04/16/2015  . PNA (pneumonia) 04/16/2015  . HIV (human immunodeficiency virus infection) (HCC)   . Tobacco abuse 03/21/2015  . Lung mass 08/31/2014  . 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 Myalgia 04/23/2014  . Pharyngitis 03/31/2014  . Hyponatremia 03/31/2014    . Fever 03/30/2014  . AIDS (HCC) 03/22/2014  . Thrush 03/22/2014  . CAP (community acquired pneumonia) 03/22/2014  . CAD (coronary artery disease) 11/13/2011     Health Maintenance Due  Topic Date Due  . Janet Berlin  10/18/1999     Physical Exam   BP 124/69   Pulse 84   Temp 97.7 F (36.5 C)   Wt 134 lb (60.8 kg)   SpO2 98%   BMI 22.30 kg/m  Physical Exam  Constitutional: He is oriented to person, place, and time. He appears well-developed and well-nourished. No distress.  HENT:  Mouth/Throat: Oropharynx is clear and moist. No oropharyngeal exudate.  Cardiovascular: Normal rate, regular rhythm and normal heart sounds. Exam reveals no gallop and no friction rub.  No murmur heard.  Pulmonary/Chest: Effort normal and breath sounds normal. No respiratory distress. He has no wheezes.  Abdominal: Soft. Bowel sounds are normal. He exhibits no distension. There is no tenderness.  Lymphadenopathy:  He has no cervical adenopathy.  Neurological: He is alert and oriented to person, place, and time.  Skin: Skin is warm and dry. No rash noted. No erythema.  Psychiatric: He has a normal mood and affect. His behavior is normal.    Lab Results  Component Value Date   CD4TCELL 19 (L) 05/28/2016   Lab Results  Component Value Date   CD4TABS 400 05/28/2016   CD4TABS 420 04/13/2016   CD4TABS 330 (L) 01/09/2016   Lab Results  Component Value Date   HIV1RNAQUANT 258 (H) 09/09/2016   No results found  for: HEPBSAB No results found for: RPR  CBC Lab Results  Component Value Date   WBC 4.3 05/28/2016   RBC 4.30 05/28/2016   HGB 13.1 (L) 05/28/2016   HCT 38.7 05/28/2016   PLT 238 05/28/2016   MCV 90.0 05/28/2016   MCH 30.5 05/28/2016   MCHC 33.9 05/28/2016   RDW 16.5 (H) 05/28/2016   LYMPHSABS 2,064 05/28/2016   MONOABS 559 05/28/2016   EOSABS 43 05/28/2016   BASOSABS 43 05/28/2016   BMET Lab Results  Component Value Date   NA 137 05/28/2016   K 4.0 05/28/2016   CL  104 05/28/2016   CO2 27 05/28/2016   GLUCOSE 85 05/28/2016   BUN 11 05/28/2016   CREATININE 1.09 05/28/2016   CALCIUM 9.1 05/28/2016   GFRNONAA 87 05/28/2016   GFRAA >89 05/28/2016     Assessment and Plan  Urinary urgency = ua nad microscopy  hiv disease = has had better adherence now, will continue on current regimen and check cd 4 count and viral load  Seasonal allergies = seeing allergist tomorrow  Health maintenance = will do tdap

## 2016-10-29 ENCOUNTER — Ambulatory Visit (INDEPENDENT_AMBULATORY_CARE_PROVIDER_SITE_OTHER): Payer: Medicare Other | Admitting: Allergy & Immunology

## 2016-10-29 ENCOUNTER — Encounter: Payer: Self-pay | Admitting: Allergy & Immunology

## 2016-10-29 VITALS — BP 120/64 | HR 76 | Temp 98.4°F | Resp 16 | Ht 64.0 in | Wt 135.0 lb

## 2016-10-29 DIAGNOSIS — J4540 Moderate persistent asthma, uncomplicated: Secondary | ICD-10-CM | POA: Diagnosis not present

## 2016-10-29 DIAGNOSIS — F1210 Cannabis abuse, uncomplicated: Secondary | ICD-10-CM | POA: Diagnosis not present

## 2016-10-29 DIAGNOSIS — J3089 Other allergic rhinitis: Secondary | ICD-10-CM

## 2016-10-29 HISTORY — DX: Moderate persistent asthma, uncomplicated: J45.40

## 2016-10-29 LAB — URINALYSIS, ROUTINE W REFLEX MICROSCOPIC
BILIRUBIN URINE: NEGATIVE
Glucose, UA: NEGATIVE
Ketones, ur: NEGATIVE
Leukocytes, UA: NEGATIVE
Nitrite: NEGATIVE
SPECIFIC GRAVITY, URINE: 1.029 (ref 1.001–1.035)
pH: 6 (ref 5.0–8.0)

## 2016-10-29 LAB — URINALYSIS, MICROSCOPIC ONLY
Bacteria, UA: NONE SEEN [HPF]
CASTS: NONE SEEN [LPF]
CRYSTALS: NONE SEEN [HPF]
RBC / HPF: NONE SEEN RBC/HPF (ref <=2)
Squamous Epithelial / LPF: NONE SEEN [HPF] (ref <=5)
Yeast: NONE SEEN [HPF]

## 2016-10-29 LAB — T-HELPER CELL (CD4) - (RCID CLINIC ONLY)
CD4 % Helper T Cell: 21 % — ABNORMAL LOW (ref 33–55)
CD4 T Cell Abs: 500 /uL (ref 400–2700)

## 2016-10-29 MED ORDER — TRIAMCINOLONE ACETONIDE 0.1 % EX OINT: TOPICAL_OINTMENT | Freq: Two times a day (BID) | CUTANEOUS | Status: DC

## 2016-10-29 MED ORDER — EPINEPHRINE 0.3 MG/0.3ML IJ SOAJ: 0.3000 mg | Freq: Once | INTRAMUSCULAR | 0 refills | Status: DC

## 2016-10-29 MED ORDER — TRIAMCINOLONE ACETONIDE 0.1 % EX OINT: 1.0000 "application " | TOPICAL_OINTMENT | Freq: Two times a day (BID) | CUTANEOUS | 0 refills | Status: DC

## 2016-10-29 MED ORDER — ALBUTEROL SULFATE HFA 108 (90 BASE) MCG/ACT IN AERS: 4.0000 | INHALATION_SPRAY | RESPIRATORY_TRACT | 2 refills | Status: DC | PRN

## 2016-10-29 MED ORDER — BECLOMETHASONE DIPROPIONATE 80 MCG/ACT IN AERS: 2.0000 | INHALATION_SPRAY | Freq: Two times a day (BID) | RESPIRATORY_TRACT | 12 refills | Status: DC

## 2016-10-29 MED ORDER — BECLOMETHASONE DIPROPIONATE 80 MCG/ACT IN AERS: 2.0000 | INHALATION_SPRAY | Freq: Two times a day (BID) | RESPIRATORY_TRACT | Status: DC

## 2016-10-29 MED ORDER — BECLOMETHASONE DIPROP MONOHYD 42 MCG/SPRAY NA SUSP: 2.0000 | Freq: Every day | NASAL | 12 refills | Status: DC

## 2016-10-29 NOTE — Patient Instructions (Addendum)
1. Moderate persistent asthma, uncomplicated - Lung testing showed evidence of asthma, therefore you should start a controller medication. - Daily controller medication(s): Qvar two puffs in the morning and two puffs in the evening with spacer - Rescue medications: albuterol 4 puffs every 4-6 hours as needed - Changes during respiratory infections or worsening symptoms: increase Qvar to 4 puffs once in the morning and once at night for TWO WEEKS. - Asthma control goals:  * Full participation in all desired activities (may need albuterol before activity) * Albuterol use two time or less a week on average (not counting use with activity) * Cough interfering with sleep two time or less a month * Oral steroids no more than once a year * No hospitalizations  2. Chronic nonseasonal allergic rhinitis due to fungal spores - Testing was positive to molds, ragweed, trees, cat, dust mite, cockroach. - We will start allergy injections to help control symptoms since the medications interact with your HIV medications. - EpiPen sent in and Consent signed. - Make an appointment in two weeks for your first injection.  3. Eczema - Continue with moisturizing twice daily. - Start triamcinolone 0.1% ointment twice daily to the worst areas.   4. Return in about 2 months (around 12/29/2016).  Please inform us of any Emergency Department visits, hospitalizations, or changes in symptoms. Call us before going to the ED for breathing or allergy symptoms since we might be able to fit you in for a sick visit. Feel free to contact us anytime with any questions, problems, or concerns.  It was a pleasure to meet you today!   Websites that have reliable patient information: 1. American Academy of Asthma, Allergy, and Immunology: www.aaaai.org 2. Food Allergy Research and Education (FARE): foodallergy.org 3. Mothers of Asthmatics: http://www.asthmacommunitynetwork.org 4. American College of Allergy, Asthma,  and Immunology: www.acaai.org  Control of Mold Allergen  Mold and fungi can grow on a variety of surfaces provided certain temperature and moisture conditions exist.  Outdoor molds grow on plants, decaying vegetation and soil.  The major outdoor mold, Alternaria and Cladosporium, are found in very high numbers during hot and dry conditions.  Generally, a late Summer - Fall peak is seen for common outdoor fungal spores.  Rain will temporarily lower outdoor mold spore count, but counts rise rapidly when the rainy period ends.  The most important indoor molds are Aspergillus and Penicillium.  Dark, humid and poorly ventilated basements are ideal sites for mold growth.  The next most common sites of mold growth are the bathroom and the kitchen.  Outdoor [employer] 1. Use air conditioning and keep windows closed 2. Avoid exposure to decaying vegetation. 3. Avoid leaf raking. 4. Avoid grain handling. 5. Consider wearing a face mask if working in moldy areas.  Indoor Mold Control 1. Maintain humidity below 50%. 2. Clean washable surfaces with 5% bleach solution. 3. Remove sources e.g. contaminated carpets.  Control of House Dust Mite Allergen    House dust mites play a major role in allergic asthma and rhinitis.  They occur in environments with high humidity wherever human skin, the food for dust mites is found. High levels have been detected in dust obtained from mattresses, pillows, carpets, upholstered furniture, bed covers, clothes and soft toys.  The principal allergen of the house dust mite is found in its feces.  A gram of dust may contain 1,000 mites and 250,000 fecal particles.  Mite antigen is easily measured in the air during house cleaning  activities.    1. Encase mattresses, including the box spring, and pillow, in an air tight cover.  Seal the zipper end of the encased mattresses with wide adhesive tape. 2. Wash the bedding in water of 130 degrees Farenheit weekly.  Avoid cotton  comforters/quilts and flannel bedding: the most ideal bed covering is the dacron comforter. 3. Remove all upholstered furniture from the bedroom. 4. Remove carpets, carpet padding, rugs, and non-washable window drapes from the bedroom.  Wash drapes weekly or use plastic window coverings. 5. Remove all non-washable stuffed toys from the bedroom.  Wash stuffed toys weekly. 6. Have the room cleaned frequently with a vacuum cleaner and a damp dust-mop.  The patient should not be in a room which is being cleaned and should wait 1 hour after cleaning before going into the room. 7. Close and seal all heating outlets in the bedroom.  Otherwise, the room will become filled with dust-laden air.  An electric heater can be used to heat the room. 8. Reduce indoor humidity to less than 50%.  Do not use a humidifier.  Control of Cockroach Allergen  Cockroach allergen has been identified as an important cause of acute attacks of asthma, especially in urban settings.  There are fifty-five species of cockroach that exist in the [place], however only three, the [place], [place] species produce allergen that can affect patients with Asthma.  Allergens can be obtained from fecal particles, egg casings and secretions from cockroaches.    1. Remove food sources. 2. Reduce access to water. 3. Seal access and entry points. 4. Spray runways with 0.5-1% Diazinon or Chlorpyrifos 5. Blow boric acid power under stoves and refrigerator. 6. Place bait stations (hydramethylnon) at feeding sites.  Reducing Pollen Exposure  The American Academy of Allergy, Asthma and Immunology suggests the following steps to reduce your exposure to pollen during allergy seasons.    1. Do not hang sheets or clothing out to dry; pollen may collect on these items. 2. Do not mow lawns or spend time around freshly cut grass; mowing stirs up pollen. 3. Keep windows closed at night.  Keep car windows closed while  driving. 4. Minimize morning activities outdoors, a time when pollen counts are usually at their highest. 5. Stay indoors as much as possible when pollen counts or humidity is high and on windy days when pollen tends to remain in the air longer. 6. Use air conditioning when possible.  Many air conditioners have filters that trap the pollen spores. 7. Use a HEPA room air filter to remove pollen form the indoor air you breathe.  Control of Dog or Cat Allergen  Avoidance is the best way to manage a dog or cat allergy. If you have a dog or cat and are allergic to dog or cats, consider removing the dog or cat from the home. If you have a dog or cat but don't want to find it a new home, or if your family wants a pet even though someone in the household is allergic, here are some strategies that may help keep symptoms at bay:  1. Keep the pet out of your bedroom and restrict it to only a few rooms. Be advised that keeping the dog or cat in only one room will not limit the allergens to that room. 2. Don't pet, hug or kiss the dog or cat; if you do, wash your hands with soap and water. 3. High-efficiency particulate air (HEPA) cleaners run continuously in a  bedroom or living room can reduce allergen levels over time. 4. Regular use of a high-efficiency vacuum cleaner or a central vacuum can reduce allergen levels. 5. Giving your dog or cat a bath at least once a week can reduce airborne allergen.

## 2016-10-29 NOTE — Progress Notes (Signed)
NEW PATIENT  Date of Service/Encounter:  10/29/16   Assessment:   Moderate persistent asthma without complication  Chronic nonseasonal allergic rhinitis due to fungal spores  Rodney Walker smoker  Asthma Reportables:  Severity: moderate persistent  Risk: high due to comorbidities Control: not well controlled  Seasonal Influenza Vaccine: yes     Plan/Recommendations:   1. Moderate persistent asthma, uncomplicated - unsure of the diagnosis of COPD at this time and concurrent Rodney Walker smoker - Lung testing showed evidence of asthma with complete normalization, therefore you should start a controller medication. - Discussed smoking cessation, although it does not seem that he is willing to pursue this at this point. - Daily controller medication(s): Qvar 57mg two puffs in the morning and two puffs in the evening with spacer - Rescue medications: albuterol 4 puffs every 4-6 hours as needed - Changes during respiratory infections or worsening symptoms: increase Qvar 849m to 4 puffs once in the morning and once at night for TWO WEEKS. - Asthma control goals:  * Full participation in all desired activities (may need albuterol before activity) * Albuterol use two time or less a week on average (not counting use with activity) * Cough interfering with sleep two time or less a month * Oral steroids no more than once a year * No hospitalizations  2. Chronic allergic rhinitis - Testing was positive to molds, ragweed, trees, cat, dust mite, cockroach. - We will start allergy injections to help control symptoms since the medications interact with your HIV medications. - Since he is currently between jobs, this would be a good time to advance the allergy shots and reach maintenance in a more efficient faster manner. - EpiPen sent in and Consent signed. - Make an appointment in two weeks for your first injection. - I also recommended that Rodney Walker check with his maKingfisherto ensure that his supply is not tainted with molds or other allergens. - This could result in the symptoms that he is having, although one would expect more pulmonary symptoms such as coughing and wheezing instead if this were the case.  3. Eczema - Continue with moisturizing twice daily. - Start triamcinolone 0.1% ointment twice daily to the worst areas.   4. Return in about 2 months (around 12/29/2016).    Subjective:   Rodney Walker a [age].o. male presenting today for evaluation of  Chief Complaint  Patient presents with  . Allergic Rhinitis     Runny nose, sneezing  . Nasal Congestion  .  Rodney Walker has a history of the following: Patient Active Problem List   Diagnosis Date Noted  . Allergic rhinitis 10/08/2016  . Cough 02/20/2016  . Anemia 05/03/2015  . Lung abscess (HC[facility] [place]04/25/2016  . Sepsis due to pneumonia (HCHarris[place]04/20/2016  . Sepsis (HCPlaya Fortuna04/20/2016  . Bullous emphysema (HCRock[place]  . Lobar pneumonia (HCF[place]  . Hyperkalemia 04/16/2015  . Empyema lung (HCCasa [place]anca04/19/2016  . PNA (pneumonia) 04/16/2015  . HIV (human immunodeficiency virus infection) (HCJAARS  . Tobacco abuse 03/21/2015  . Lung mass 08/31/2014  . 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 Myalgia 04/23/2014  . Pharyngitis 03/31/2014  . Hyponatremia 03/31/2014  . Fever 03/30/2014  . AIDS (HCSouth Lineville03/26/2015  . Thrush 03/22/2014  . CAP (community acquired pneumonia) 03/22/2014  . CAD (coronary artery disease) 11/13/2011    History obtained from: chart review and patient.  Rodney Walker was referred by Rodney Walker.     Rodney Walker  is a 35 y.o. male with a history of HIV (on HAART) presenting for an allergy evaluation. He reports rhinorrhea and occasional sneezing. This has been ongoing for five months. He has had allergies in the past but they never last this long. Symptoms are consistent both indoors and outdoors. He has tried the sprays and cetirizine with minimal  improvement. He has tried fluticasone in the past but it was interacting with the HIV medications so he stopped. His nasal steroid was changed to beconase instead, which he used for three weeks without improvement. He was on Patanase but he does not use it. He is from [place] and has lived in [place] for 13 years.   Rodney Walker also has a history of eczema. Currently he is using over the counter moisturizers (aloe vera). He does have steroid prescriptions but could not afford it. He does not have asthma but according to the patient, he does carry the diagnosis of COPD which was diagnosed within the last couple of years. He never uses the albuterol inhaler. He has been on steroids for this breathing, last course was within the last year. He denies coughing and wheezing but overall does not seem to have much of an insight into his symptoms.   Rodney Walker has HIV (diagnosed in 2015), currently on HAART for this (darunavir-cobicistat with emtricitabine-tenofovir) and followed closely by Rodney Walker. These medications were recently changed because of the develop of viral resistance. He went through a period of time in which he did not take his antivirals, therefore his T cell counts decreased acutely. His adherence has improved. Last visit with Rodney Walker was yesterday.    Otherwise, there is no history of other atopic diseases, including drug allergies, food allergies, stinging insect allergies, or urticaria. There is no significant infectious history. Vaccinations are up to date (received Tdap yesterday).     Past Medical History: Patient Active Problem List   Diagnosis Date Noted  . Allergic rhinitis 10/08/2016  . Cough 02/20/2016  . Anemia 05/03/2015  . Lung abscess ([place]) 04/22/2015  . Sepsis due to pneumonia ([place]) 04/17/2015  . Sepsis ([place]) 04/17/2015  . Bullous emphysema ([place])   . Lobar pneumonia ([employer])   . Hyperkalemia 04/16/2015  . Empyema lung ([place]) 04/16/2015  . PNA  (pneumonia) 04/16/2015  . HIV (human immunodeficiency virus infection) ([place])   . Tobacco abuse 03/21/2015  . Lung mass 08/31/2014  . Eosinophilic folliculitis 95/62/1308  . Myalgia 04/23/2014  . Pharyngitis 03/31/2014  . Hyponatremia 03/31/2014  . Fever 03/30/2014  . AIDS ([place]) 03/22/2014  . Thrush 03/22/2014  . CAP (community acquired pneumonia) 03/22/2014  . CAD (coronary artery disease) 11/13/2011    Medication List:    Medication List       Accurate as of 10/29/16 12:59 PM. Always use your most recent med list.          albuterol 108 (90 Base) MCG/ACT inhaler Commonly known as:  PROVENTIL HFA;VENTOLIN HFA Inhale 4 puffs into the lungs every 4 (four) hours as needed for wheezing or shortness of breath.   aspirin 325 MG tablet Take 650 mg by mouth daily as needed for fever. Reported on 05/28/2016   beclomethasone 42 MCG/SPRAY nasal spray Commonly known as:  BECONASE AQ Place 2 sprays into both nostrils daily. Dose is for each nostril.   beclomethasone 80 MCG/ACT inhaler Commonly known as:  QVAR Inhale 2 puffs into the lungs 2 (two) times daily.   cetirizine 10 MG tablet Commonly known  as:  ZYRTEC Take 10 mg by mouth daily.   darunavir-cobicistat 800-150 MG tablet Commonly known as:  PREZCOBIX Take 1 tablet by mouth daily. Swallow whole. Do NOT crush, break or chew tablets. Take with food.   emtricitabine-tenofovir AF 200-25 MG tablet Commonly known as:  DESCOVY Take 1 tablet by mouth daily.   EPINEPHrine 0.3 mg/0.3 mL Soaj injection Commonly known as:  EPI-PEN Inject 0.3 mLs (0.3 mg total) into the muscle once.   ibuprofen 200 MG tablet Commonly known as:  ADVIL,MOTRIN Take 200 mg by mouth every 6 (six) hours as needed (pain). Reported on 05/28/2016   triamcinolone ointment 0.1 % Commonly known as:  KENALOG Apply 1 application topically 2 (two) times daily.   trimethoprim-polymyxin b ophthalmic solution Commonly known as:  POLYTRIM Place 2 drops into the  left eye every 4 (four) hours. For 7-10 days       Birth History: non-contributory. Born at term without complications.   Developmental History: Perle has met all milestones on time. He has required no speech therapy, occupational therapy, or physical therapy.   Past Surgical History: Past Surgical History:  Procedure Laterality Date  . CARDIAC CATHETERIZATION  06/24/2009   EF 60%  . CARDIAC CATHETERIZATION  06/17/2009   EF 45%. ANTERIOR HYPOKINESIS  . US ECHOCARDIOGRAPHY  12/09/2009   EF 55-60%  . VIDEO ASSISTED THORACOSCOPY (VATS)/ LOBECTOMY Right 04/23/2015   Procedure: VIDEO ASSISTED THORACOSCOPY (VATS)/RIGHT upper  and right middle LOBECTOMY;  Surgeon: Ivin Poot, MD;  Location: [facility];  Service: Thoracic;  Laterality: Right;  Marland Kitchen VIDEO BRONCHOSCOPY Bilateral 09/05/2014   Procedure: VIDEO BRONCHOSCOPY WITH FLUORO;  Surgeon: Wilhelmina Mcardle, MD;  Location: [facility] ENDOSCOPY;  Service: Cardiopulmonary;  Laterality: Bilateral;  . VIDEO BRONCHOSCOPY Bilateral 04/10/2015   Procedure: VIDEO BRONCHOSCOPY WITH FLUORO;  Surgeon: Juanito Doom, MD;  Location: [facility];  Service: Cardiopulmonary;  Laterality: Bilateral;     Family History: Family History  Problem Relation Age of Onset  . Diabetes Mother   . Eczema Mother   . Asthma Mother   . Liver cancer Father   . Asthma Brother      Social History: Rodney Walker lives at home by himself. He lives in a house of unknown age. There is one throughout the home. There is mildew as well as roaches in the home. He has gas heating and central cooling. There are dogs and cats outside of the home but none inside. He does have dust mite covers for his bed, but denies pillows. He does smokes cigarettes but mostly marijuana. 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   Review of Systems: a 14-point review of systems is pertinent for what is mentioned in HPI.  Otherwise, all other systems were  negative. Constitutional: negative other than that listed in the HPI Eyes: negative other than that listed in the HPI Ears, nose, mouth, throat, and face: negative other than that listed in the HPI Respiratory: negative other than that listed in the HPI Cardiovascular: negative other than that listed in the HPI Gastrointestinal: negative other than that listed in the HPI Genitourinary: negative other than that listed in the HPI Integument: negative other than that listed in the HPI Hematologic: negative other than that listed in the HPI Musculoskeletal: negative other than that listed in the HPI Neurological: negative other than that listed in the HPI Allergy/Immunologic: negative other than that listed in the HPI    Objective:  Blood pressure 120/64, pulse 76, temperature 98.4 F (36.9 C), temperature source Oral, resp. rate 16, height 5' 4"  (1.626 m), weight 135 lb (61.2 kg), SpO2 97 %. Body mass index is 23.17 kg/m.   Physical Exam:  General: Alert, interactive, in no acute distress. Thin male. Cooperative with the exam. HEENT: TMs pearly gray, turbinates edematous without discharge, post-pharynx erythematous. Neck: Supple without thyromegaly. Adenopathy: no enlarged lymph nodes appreciated in the anterior cervical, occipital, axillary, epitrochlear, inguinal, or popliteal regions Lungs: Clear to auscultation without wheezing, rhonchi or rales. No increased work of breathing. CV: Physiologic splitting of S1/S2, no murmurs. Capillary refill <2 seconds.  Abdomen: Nondistended, nontender. No guarding or rebound tenderness. Bowel sounds faint and hypoactive  Skin: Warm and dry, without lesions or rashes. Multiple tattoos. Extremities:  No clubbing, cyanosis or edema. Neuro:   Grossly intact. No focal deficits.  Diagnostic studies:  Spirometry: results abnormal (FEV1: 2.02/73%, FVC: 3.12/94%, FEV1/FVC: 64%).    Spirometry consistent with mild obstructive disease. Albuterol  nebulizer treatment given in clinic with significant improvement. The forced vital capacity did not change, however the FEV1 increased 470 mL (23%) and the FEF 25-75% increase 700 mL (45%). This is significant per ATS criteria.   Allergy Studies:   Indoor/Outdoor Percutaneous Adult Environmental Panel: Positive to 3 molds, otherwise negative with adequate controls  Indoor/Outdoor Selected Intradermal Environmental Panel: Positive to ragweed, tree, mold mix 1, mold mixes 4, cat, cockroach, and dust mite     Salvatore Marvel, MD [facility] [place] [facility] [place]

## 2016-10-31 ENCOUNTER — Other Ambulatory Visit: Payer: Self-pay | Admitting: Internal Medicine

## 2016-10-31 DIAGNOSIS — B20 Human immunodeficiency virus [HIV] disease: Secondary | ICD-10-CM

## 2016-10-31 LAB — HIV-1 RNA QUANT-NO REFLEX-BLD
HIV 1 RNA Quant: 294 copies/mL — ABNORMAL HIGH (ref <20)
HIV-1 RNA Quant, Log: 2.47 Log copies/mL — ABNORMAL HIGH (ref <1.30)

## 2017-01-28 ENCOUNTER — Ambulatory Visit (INDEPENDENT_AMBULATORY_CARE_PROVIDER_SITE_OTHER): Payer: Medicare Other | Admitting: Internal Medicine

## 2017-01-28 ENCOUNTER — Encounter: Payer: Self-pay | Admitting: Internal Medicine

## 2017-01-28 VITALS — BP 119/70 | HR 66 | Temp 98.3°F | Ht 65.0 in | Wt 135.0 lb

## 2017-01-28 DIAGNOSIS — B20 Human immunodeficiency virus [HIV] disease: Secondary | ICD-10-CM | POA: Diagnosis not present

## 2017-01-28 DIAGNOSIS — L308 Other specified dermatitis: Secondary | ICD-10-CM | POA: Diagnosis not present

## 2017-01-28 LAB — HEPATITIS C ANTIBODY: HCV Ab: NEGATIVE

## 2017-01-28 MED ORDER — TRIAMCINOLONE ACETONIDE 0.1 % EX OINT: 1.0000 "application " | TOPICAL_OINTMENT | Freq: Two times a day (BID) | CUTANEOUS | 0 refills | Status: DC

## 2017-01-28 NOTE — Progress Notes (Signed)
Rfv: hiv disease follow up  Patient ID: Rodney Walker, male   DOB: [birth year], 36 y.o.   MRN: [id number]  HPI Rodney Walker is a 36yo M with HIV disease, CD 4 count of He denies missing any doses since last visit. He has felt like there are less stressors since we last saw him in clinic. Overall does not have any complaints  Outpatient Encounter Prescriptions as of 01/28/2017  Medication Sig  . DESCOVY 200-25 MG tablet TAKE 1 TABLET BY MOUTH EVERY DAY  . PREZCOBIX 800-150 MG tablet TAKE 1 TABLET BY MOUTH EVERY DAY WITH FOOD  . triamcinolone ointment (KENALOG) 0.1 % Apply 1 application topically 2 (two) times daily.  Marland Kitchen trimethoprim-polymyxin b (POLYTRIM) ophthalmic solution Place 2 drops into the left eye every 4 (four) hours. For 7-10 days  . albuterol (PROVENTIL HFA;VENTOLIN HFA) 108 (90 Base) MCG/ACT inhaler Inhale 4 puffs into the lungs every 4 (four) hours as needed for wheezing or shortness of breath. (Patient not taking: Reported on 01/28/2017)  . aspirin 325 MG tablet Take 650 mg by mouth daily as needed for fever. Reported on 05/28/2016  . beclomethasone (BECONASE AQ) 42 MCG/SPRAY nasal spray Place 2 sprays into both nostrils daily. Dose is for each nostril. (Patient not taking: Reported on 01/28/2017)  . beclomethasone (QVAR) 80 MCG/ACT inhaler Inhale 2 puffs into the lungs 2 (two) times daily. (Patient not taking: Reported on 01/28/2017)  . cetirizine (ZYRTEC) 10 MG tablet Take 10 mg by mouth daily.  Marland Kitchen EPINEPHrine 0.3 mg/0.3 mL IJ SOAJ injection Inject 0.3 mLs (0.3 mg total) into the muscle once. (Patient not taking: Reported on 01/28/2017)  . EPINEPHrine 0.3 mg/0.3 mL IJ SOAJ injection   . ibuprofen (ADVIL,MOTRIN) 200 MG tablet Take 200 mg by mouth every 6 (six) hours as needed (pain). Reported on 05/28/2016   No facility-administered encounter medications on file as of 01/28/2017.      Patient Active Problem List   Diagnosis Date Noted  . Marijuana abuse, continuous 10/29/2016    . Moderate persistent asthma without complication 10/29/2016  . Allergic rhinitis 10/08/2016  . Cough 02/20/2016  . Anemia 05/03/2015  . Lung abscess (HCC) 04/22/2015  . Sepsis due to pneumonia (HCC) 04/17/2015  . Sepsis (HCC) 04/17/2015  . Bullous emphysema (HCC)   . Lobar pneumonia (HCC)   . Hyperkalemia 04/16/2015  . Empyema lung (HCC) 04/16/2015  . PNA (pneumonia) 04/16/2015  . HIV (human immunodeficiency virus infection) (HCC)   . Tobacco abuse 03/21/2015  . Lung mass 08/31/2014  . 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 Myalgia 04/23/2014  . Pharyngitis 03/31/2014  . Hyponatremia 03/31/2014  . Fever 03/30/2014  . AIDS (HCC) 03/22/2014  . Thrush 03/22/2014  . CAP (community acquired pneumonia) 03/22/2014  . CAD (coronary artery disease) 11/13/2011     There are no preventive care reminders to display for this patient.   Review of Systems Review of Systems  Constitutional: Negative for fever, chills, diaphoresis, activity change, appetite change, fatigue and unexpected weight change.  HENT: Negative for congestion, sore throat, rhinorrhea, sneezing, trouble swallowing and sinus pressure.  Eyes: Negative for photophobia and visual disturbance.  Respiratory: Negative for cough, chest tightness, shortness of breath, wheezing and stridor.  Cardiovascular: Negative for chest pain, palpitations and leg swelling.  Gastrointestinal: Negative for nausea, vomiting, abdominal pain, diarrhea, constipation, blood in stool, abdominal distention and anal bleeding.  Genitourinary: Negative for dysuria, hematuria, flank pain and difficulty urinating.  Musculoskeletal: Negative for myalgias, back pain, joint swelling,  arthralgias and gait problem.  Skin: + rash c/w eczema flare Neurological: Negative for dizziness, tremors, weakness and light-headedness.  Hematological: Negative for adenopathy. Does not bruise/bleed easily.  Psychiatric/Behavioral: Negative for behavioral problems,  confusion, sleep disturbance, dysphoric mood, decreased concentration and agitation.    Physical Exam   BP 119/70   Pulse 66   Temp 98.3 F (36.8 C) (Oral)   Ht 5\' 5"  (1.651 m)   Wt 135 lb (61.2 kg)   BMI 22.47 kg/m   Physical Exam  Constitutional: He is oriented to person, place, and time. He appears well-developed and well-nourished. No distress.  HENT:  Mouth/Throat: Oropharynx is clear and moist. No oropharyngeal exudate.  Cardiovascular: Normal rate, regular rhythm and normal heart sounds. Exam reveals no gallop and no friction rub.  No murmur heard.  Pulmonary/Chest: Effort normal and breath sounds normal. No respiratory distress. He has no wheezes.  Abdominal: Soft. Bowel sounds are normal. He exhibits no distension. There is no tenderness.  Lymphadenopathy:  He has no cervical adenopathy.  Neurological: He is alert and oriented to person, place, and time.  Skin: Skin is warm and dry.+ mild scaliness at elbows Psychiatric: He has a normal mood and affect. His behavior is normal.    Lab Results  Component Value Date   CD4TCELL 21 (L) 10/28/2016   Lab Results  Component Value Date   CD4TABS 500 10/28/2016   CD4TABS 400 05/28/2016   CD4TABS 420 04/13/2016   Lab Results  Component Value Date   HIV1RNAQUANT 294 (H) 10/28/2016   No results found for: HEPBSAB Lab Results  Component Value Date   LABRPR NON REAC 04/13/2016    CBC Lab Results  Component Value Date   WBC 7.3 10/28/2016   RBC 4.48 10/28/2016   HGB 14.4 10/28/2016   HCT 42.3 10/28/2016   PLT 269 10/28/2016   MCV 94.4 10/28/2016   MCH 32.1 10/28/2016   MCHC 34.0 10/28/2016   RDW 14.0 10/28/2016   LYMPHSABS 2,555 10/28/2016   MONOABS 584 10/28/2016   EOSABS 73 10/28/2016    BMET Lab Results  Component Value Date   NA 137 05/28/2016   K 4.0 05/28/2016   CL 104 05/28/2016   CO2 27 05/28/2016   GLUCOSE 85 05/28/2016   BUN 11 05/28/2016   CREATININE 1.09 05/28/2016   CALCIUM 9.1  05/28/2016   GFRNONAA 87 05/28/2016   GFRAA >89 05/28/2016      Assessment and Plan  Poorly controlled hiv disease = will check viral load and cd 4 count. May need to change his regimen  Adherence counseling = spent with patient with greater than 50% discussing importance of adherence with hiv  Health maintenance = meningococcal at next visit, since we do not have vaccines available- will check hep c  Eczema = needs refill on topical ointment to minimize itchiness

## 2017-01-29 LAB — T-HELPER CELL (CD4) - (RCID CLINIC ONLY)
CD4 T CELL ABS: 490 /uL (ref 400–2700)
CD4 T CELL HELPER: 20 % — AB (ref 33–55)

## 2017-01-29 LAB — RPR

## 2017-01-30 ENCOUNTER — Other Ambulatory Visit: Payer: Self-pay | Admitting: Internal Medicine

## 2017-02-01 LAB — HIV-1 RNA QUANT-NO REFLEX-BLD
HIV 1 RNA QUANT: 413 {copies}/mL — AB
HIV-1 RNA Quant, Log: 2.62 Log copies/mL — ABNORMAL HIGH

## 2017-02-01 MED ORDER — TRIAMCINOLONE ACETONIDE 0.1 % EX OINT: 1.0000 "application " | TOPICAL_OINTMENT | Freq: Two times a day (BID) | CUTANEOUS | 1 refills | Status: DC

## 2017-02-12 ENCOUNTER — Other Ambulatory Visit (HOSPITAL_COMMUNITY)
Admission: RE | Admit: 2017-02-12 | Discharge: 2017-02-12 | Disposition: A | Discharge status: Home / Self Care | Payer: Medicare Other | Source: Ambulatory Visit | Attending: Internal Medicine | Admitting: Internal Medicine

## 2017-02-12 ENCOUNTER — Other Ambulatory Visit: Payer: Medicare Other

## 2017-02-12 DIAGNOSIS — Z79899 Other long term (current) drug therapy: Secondary | ICD-10-CM | POA: Diagnosis not present

## 2017-02-12 DIAGNOSIS — Z113 Encounter for screening for infections with a predominantly sexual mode of transmission: Secondary | ICD-10-CM

## 2017-02-12 LAB — LIPID PANEL
Cholesterol: 188 mg/dL (ref <200)
HDL: 44 mg/dL (ref >40)
LDL CALC: 129 mg/dL — AB (ref <100)
Total CHOL/HDL Ratio: 4.3 Ratio (ref <5.0)
Triglycerides: 77 mg/dL (ref <150)
VLDL: 15 mg/dL (ref <30)

## 2017-02-13 LAB — RPR

## 2017-02-15 LAB — URINE CYTOLOGY ANCILLARY ONLY
Chlamydia: NEGATIVE
Neisseria Gonorrhea: NEGATIVE

## 2017-03-03 ENCOUNTER — Other Ambulatory Visit: Payer: Self-pay | Admitting: Internal Medicine

## 2017-03-03 DIAGNOSIS — B20 Human immunodeficiency virus [HIV] disease: Secondary | ICD-10-CM

## 2017-03-16 ENCOUNTER — Encounter: Payer: Self-pay | Admitting: Pharmacist Clinician (PhC)/ Clinical Pharmacy Specialist

## 2017-04-27 ENCOUNTER — Encounter: Payer: Self-pay | Admitting: Internal Medicine

## 2017-04-27 ENCOUNTER — Ambulatory Visit (INDEPENDENT_AMBULATORY_CARE_PROVIDER_SITE_OTHER): Payer: Medicare Other | Admitting: Internal Medicine

## 2017-04-27 ENCOUNTER — Encounter (HOSPITAL_COMMUNITY): Payer: Self-pay | Admitting: Emergency Medicine

## 2017-04-27 ENCOUNTER — Emergency Department (HOSPITAL_COMMUNITY)
Admission: EM | Admit: 2017-04-27 | Discharge: 2017-04-27 | Disposition: A | Discharge status: Home / Self Care | Payer: Medicare Other | Attending: Emergency Medicine | Admitting: Emergency Medicine

## 2017-04-27 ENCOUNTER — Emergency Department (HOSPITAL_COMMUNITY): Payer: Medicare Other

## 2017-04-27 VITALS — BP 130/75 | HR 96 | Temp 97.8°F | Ht 62.0 in | Wt 134.0 lb

## 2017-04-27 DIAGNOSIS — R29810 Facial weakness: Secondary | ICD-10-CM | POA: Diagnosis not present

## 2017-04-27 DIAGNOSIS — Z79899 Other long term (current) drug therapy: Secondary | ICD-10-CM

## 2017-04-27 DIAGNOSIS — I252 Old myocardial infarction: Secondary | ICD-10-CM | POA: Diagnosis not present

## 2017-04-27 DIAGNOSIS — B20 Human immunodeficiency virus [HIV] disease: Secondary | ICD-10-CM | POA: Diagnosis not present

## 2017-04-27 DIAGNOSIS — J45909 Unspecified asthma, uncomplicated: Secondary | ICD-10-CM | POA: Diagnosis not present

## 2017-04-27 DIAGNOSIS — Z7982 Long term (current) use of aspirin: Secondary | ICD-10-CM | POA: Insufficient documentation

## 2017-04-27 DIAGNOSIS — I2510 Atherosclerotic heart disease of native coronary artery without angina pectoris: Secondary | ICD-10-CM | POA: Diagnosis not present

## 2017-04-27 DIAGNOSIS — G510 Bell's palsy: Secondary | ICD-10-CM | POA: Diagnosis not present

## 2017-04-27 DIAGNOSIS — Z87891 Personal history of nicotine dependence: Secondary | ICD-10-CM | POA: Insufficient documentation

## 2017-04-27 DIAGNOSIS — R200 Anesthesia of skin: Secondary | ICD-10-CM | POA: Diagnosis present

## 2017-04-27 MED ORDER — BICTEGRAVIR-EMTRICITAB-TENOFOV 50-200-25 MG PO TABS: 1.0000 | ORAL_TABLET | Freq: Every day | ORAL | 5 refills | Status: DC

## 2017-04-27 MED ORDER — DARUNAVIR-COBICISTAT 800-150 MG PO TABS: 1.0000 | ORAL_TABLET | Freq: Every day | ORAL | 5 refills | Status: DC

## 2017-04-27 MED ORDER — ACYCLOVIR 400 MG PO TABS: 400.0000 mg | ORAL_TABLET | Freq: Every day | ORAL | 0 refills | Status: DC

## 2017-04-27 MED FILL — BIKTARVY 50-200-25 MG TABS: 50-200-25 | 30 days supply | Qty: 30 | Fill #0

## 2017-04-27 MED FILL — PREZCOBIX 800 MG-150 MG TAB: 800-150 | 30 days supply | Qty: 30 | Fill #0

## 2017-04-27 NOTE — Progress Notes (Signed)
HPI: Rodney Walker is a 36 y.o. male here for HIV follow up.  Allergies: No Known Allergies  Vitals: Temp: 97.8 F (36.6 C) (05/01 1059) Temp Source: Oral (05/01 1059) BP: 130/75 (05/01 1059) Pulse Rate: 96 (05/01 1059)  Past Medical History: Past Medical History:  Diagnosis Date  . Chest pain 2010  . Coronary artery disease    SPONTANEOUS DISSECTION AND PLAQUE RUPTURE OF HIS LEFT ANTERIOR DESCENDING ARTERY  . Eczema   . Emphysema lung (HCC)   . Empyema lung (HCC)   . HIV infection (HCC)   . Hyperlipidemia   . Immune deficiency disorder (HCC)   . MI (myocardial infarction) (HCC)   . Moderate persistent asthma without complication 10/29/2016    Social History: Social History   Social History  . Marital status: Single    Spouse name: N/A  . Number of children: N/A  . Years of education: N/A   Occupational History  . unemployed    Social History Main Topics  . Smoking status: Former Smoker    Packs/day: 0.25    Years: 17.00    Types: Cigarettes    Quit date: 03/23/2015  . Smokeless tobacco: Never Used     Comment: has cut back significantly, 3 cigs/day.  03/19/15  . Alcohol use No  . Drug use: Yes    Frequency: 2.0 times per week    Types: Marijuana  . Sexual activity: Not Currently    Partners: Male     Comment: given condoms   Other Topics Concern  . None   Social History Narrative  . None    Previous Regimen: [place]/T[place]  Current Regimen: Prezcobix/Descovy  Labs: HIV 1 RNA Quant (copies/mL)  Date Value  01/28/2017 413 (H)  10/28/2016 294 (H)  09/09/2016 258 (H)   CD4 T Cell Abs (/uL)  Date Value  01/28/2017 490  10/28/2016 500  05/28/2016 400   Hepatitis B Surface Ag (no units)  Date Value  03/22/2014 NEGATIVE   HCV Ab (no units)  Date Value  01/28/2017 NEGATIVE    CrCl: CrCl cannot be calculated (Patient's most recent lab result is older than the maximum 21 days allowed.).  Lipids:    Component Value  Date/Time   CHOL 188 02/12/2017 0954   TRIG 77 02/12/2017 0954   HDL 44 02/12/2017 0954   CHOLHDL 4.3 02/12/2017 0954   VLDL 15 02/12/2017 0954   LDLCALC 129 (H) 02/12/2017 0954    Assessment: Rodney Walker is a 36 yo M who presents today for HIV follow up appointment. He continues to have a detectable viral load despite insisting he is taking his Prezcobix/Descovy. He does report taking both medications every day but the time of day he takes varies. A pillbox keychain is attached to his keys, inside there is a single Descovy tablet. Patient reports the pillbox fits 3 Prezcobix and 4 Descovy.  We discussed the importance of making sure he takes his medications at the same time each day with food and to not miss any doses. Today, we will change his regimen to Biktarvy/Prezcobix to see if we can get better viral control. We will send these prescriptions into [facility] to be mailed to his home. Patient also presents with complaints of facial tingling and numbness, Dr. Drue Second has referred him to ED today for further evaluation.  Recommendations: - Stop Descovy - Start Biktarvy/Prezcobix - Follow up in pharmacy clinic in 1 month for labs  Rodney Walker, PharmD, BCPS PGY-2 Infectious Diseases Pharmacy  Resident Pager: [phone] 04/27/2017, 11:38 AM

## 2017-04-27 NOTE — Discharge Instructions (Signed)
Please read attached information. If you experience any new or worsening signs or symptoms please return to the emergency room for evaluation. Please follow-up with your primary care provider or specialist as discussed.  Please inform them of todays visit and need for ongoing management.

## 2017-04-27 NOTE — ED Provider Notes (Signed)
[facility]-EMERGENCY DEPT Provider Note   CSN: [id number] Arrival date & time: 04/27/17  1139     History   Chief Complaint Chief Complaint  Patient presents with  . Numbness    HPI Rodney Walker is a 36 y.o. male.  HPI   36 year old male presents today with complaints of facial numbness and weakness.  Patient notes that approximately 3 days ago he started to experience difficulty with drinking out of a straw with decreased sensation to the left part of his face.  Patient notes this was abnormal for him as he neither experienced this before.  He has had intermittent tingling and numbness in his right hand and left foot that has come and gone previously on gabapentin for this.  Patient denies any significant headache, neck stiffness, trauma.  No recent insect bites.  Patient notes that approximately 1 week ago he had an upper respiratory infection that seems to be improved now.  He denies any distal neurological deficits presently other than those noted above.  Patient notes he follows closely with infectious disease and is poorly controlled.   Past Medical History:  Diagnosis Date  . Chest pain 2010  . Coronary artery disease    SPONTANEOUS DISSECTION AND PLAQUE RUPTURE OF HIS LEFT ANTERIOR DESCENDING ARTERY  . Eczema   . Emphysema lung (HCC)   . Empyema lung (HCC)   . HIV infection (HCC)   . Hyperlipidemia   . Immune deficiency disorder (HCC)   . MI (myocardial infarction) (HCC)   . Moderate persistent asthma without complication 10/29/2016    Patient Active Problem List   Diagnosis Date Noted  . Marijuana abuse, continuous 10/29/2016  . Moderate persistent asthma without complication 10/29/2016  . Allergic rhinitis 10/08/2016  . Cough 02/20/2016  . Anemia 05/03/2015  . Lung abscess (HCC) 04/22/2015  . Sepsis due to pneumonia (HCC) 04/17/2015  . Sepsis (HCC) 04/17/2015  . Bullous emphysema (HCC)   . Lobar pneumonia (HCC)   . Hyperkalemia 04/16/2015  . Empyema  lung (HCC) 04/16/2015  . PNA (pneumonia) 04/16/2015  . HIV (human immunodeficiency virus infection) (HCC)   . Tobacco abuse 03/21/2015  . Lung mass 08/31/2014  . 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 Myalgia 04/23/2014  . Pharyngitis 03/31/2014  . Hyponatremia 03/31/2014  . Fever 03/30/2014  . AIDS (HCC) 03/22/2014  . Thrush 03/22/2014  . CAP (community acquired pneumonia) 03/22/2014  . CAD (coronary artery disease) 11/13/2011    Past Surgical History:  Procedure Laterality Date  . CARDIAC CATHETERIZATION  06/24/2009   EF 60%  . CARDIAC CATHETERIZATION  06/17/2009   EF 45%. ANTERIOR HYPOKINESIS  . US ECHOCARDIOGRAPHY  12/09/2009   EF 55-60%  . VIDEO ASSISTED THORACOSCOPY (VATS)/ LOBECTOMY Right 04/23/2015   Procedure: VIDEO ASSISTED THORACOSCOPY (VATS)/RIGHT upper  and right middle LOBECTOMY;  Surgeon: Kerin Perna, MD;  Location: [facility] OR;  Service: Thoracic;  Laterality: Right;  Marland Kitchen VIDEO BRONCHOSCOPY Bilateral 09/05/2014   Procedure: VIDEO BRONCHOSCOPY WITH FLUORO;  Surgeon: Merwyn Katos, MD;  Location: [facility] ENDOSCOPY;  Service: Cardiopulmonary;  Laterality: Bilateral;  . VIDEO BRONCHOSCOPY Bilateral 04/10/2015   Procedure: VIDEO BRONCHOSCOPY WITH FLUORO;  Surgeon: Lupita Leash, MD;  Location: [facility] ENDOSCOPY;  Service: Cardiopulmonary;  Laterality: Bilateral;       Home Medications    Prior to Admission medications   Medication Sig Start Date End Date Taking? Authorizing Provider  albuterol (PROVENTIL HFA;VENTOLIN HFA) 108 (90 Base) MCG/ACT inhaler Inhale 4 puffs into the lungs every 4 (four)  hours as needed for wheezing or shortness of breath. 10/29/16  Yes Alfonse Spruce, MD  aspirin 325 MG tablet Take 650 mg by mouth daily as needed for fever. Reported on 05/28/2016   Yes Historical Provider, MD  darunavir-cobicistat (PREZCOBIX) 800-150 MG tablet Take 1 tablet by mouth daily. with food 04/27/17  Yes Judyann Munson, MD  EPINEPHrine 0.3 mg/0.3 mL IJ SOAJ injection Inject  0.3 mLs (0.3 mg total) into the muscle once. 10/29/16 04/27/17 Yes Alfonse Spruce, MD  ibuprofen (ADVIL,MOTRIN) 200 MG tablet Take 200 mg by mouth every 6 (six) hours as needed for headache or moderate pain. Reported on 05/28/2016   Yes Historical Provider, MD  acyclovir (ZOVIRAX) 400 MG tablet Take 1 tablet (400 mg total) by mouth 5 (five) times daily. 04/27/17   Eyvonne Mechanic, PA-C  beclomethasone (BECONASE AQ) 42 MCG/SPRAY nasal spray Place 2 sprays into both nostrils daily. Dose is for each nostril. Patient not taking: Reported on 01/28/2017 10/29/16   Alfonse Spruce, MD  beclomethasone (QVAR) 80 MCG/ACT inhaler Inhale 2 puffs into the lungs 2 (two) times daily. Patient not taking: Reported on 04/27/2017 10/29/16   Alfonse Spruce, MD  bictegravir-emtricitabine-tenofovir AF (BIKTARVY) 50-200-25 MG TABS tablet Take 1 tablet by mouth daily with breakfast. Patient not taking: Reported on 04/27/2017 04/27/17   Judyann Munson, MD  triamcinolone ointment (KENALOG) 0.1 % Apply 1 application topically 2 (two) times daily. Patient not taking: Reported on 04/27/2017 02/01/17   Judyann Munson, MD  trimethoprim-polymyxin b (POLYTRIM) ophthalmic solution Place 2 drops into the left eye every 4 (four) hours. For 7-10 days Patient not taking: Reported on 04/27/2017 06/13/16   Jerre Simon, PA    Family History Family History  Problem Relation Age of Onset  . Diabetes Mother   . Eczema Mother   . Asthma Mother   . Liver cancer Father   . Asthma Brother     Social History Social History  Substance Use Topics  . Smoking status: Former Smoker    Packs/day: 0.25    Years: 17.00    Types: Cigarettes    Quit date: 03/23/2015  . Smokeless tobacco: Never Used     Comment: has cut back significantly, 3 cigs/day.  03/19/15  . Alcohol use No    Allergies   Patient has no known allergies.  Review of Systems Review of Systems  All other systems reviewed and are negative.  Physical Exam Updated Vital  Signs BP 109/75 (BP Location: Right Arm)   Pulse 64   Temp 97.7 F (36.5 C) (Oral)   Resp 16   SpO2 100%   Physical Exam  Constitutional: He is oriented to person, place, and time. He appears well-developed and well-nourished.  HENT:  Head: Normocephalic and atraumatic.  Right Ear: Tympanic membrane, external ear and ear canal normal.  Left Ear: Tympanic membrane, external ear and ear canal normal.  Eyes: Conjunctivae are normal. Pupils are equal, round, and reactive to light. Right eye exhibits no discharge. Left eye exhibits no discharge. No scleral icterus.  Neck: Normal range of motion. No JVD present. No tracheal deviation present.  Pulmonary/Chest: Effort normal. No stridor.  Neurological: He is alert and oriented to person, place, and time. A cranial nerve deficit and sensory deficit is present. Coordination normal. GCS eye subscore is 4. GCS verbal subscore is 5. GCS motor subscore is 6.  Mild flattening of left nasolabial fold, minor droop of the left corner mouth, week brow raise on the  left.  Patient is able to close eyes bilateral, minor weakness noted on the left against resistance, minor weakness with left-sided smile  Decreased sensation to left cheek  Bilateral upper and lower extremity sensation strength and motor function intact.  Strength is 5 out of 5  Psychiatric: He has a normal mood and affect. His behavior is normal. Judgment and thought content normal.  Nursing note and vitals reviewed.   ED Treatments / Results  Labs (all labs ordered are listed, but only abnormal results are displayed) Labs Reviewed - No data to display  EKG  EKG Interpretation None       Radiology Mr Brain Wo Contrast  Result Date: 04/27/2017 CLINICAL DATA:  Left facial weakness EXAM: MRI HEAD WITHOUT CONTRAST TECHNIQUE: Multiplanar, multiecho pulse sequences of the brain and surrounding structures were obtained without intravenous contrast. COMPARISON:  None. FINDINGS: Brain: No  acute infarction, hemorrhage, hydrocephalus, extra-axial collection or mass lesion. Vascular: Normal arterial flow voids. Skull and upper cervical spine: Negative Sinuses/Orbits: Mucosal edema in the left maxillary sinus. Normal orbit Other: None IMPRESSION: Negative MRI of the brain. Electronically Signed   By: Marlan Palau M.D.   On: 04/27/2017 15:17    Procedures Procedures (including critical care time)  Medications Ordered in ED Medications - No data to display   Initial Impression / Assessment and Plan / ED Course  I have reviewed the triage vital signs and the nursing notes.  Pertinent labs & imaging results that were available during my care of the patient were reviewed by me and considered in my medical decision making (see chart for details).    Final Clinical Impressions(s) / ED Diagnoses   Final diagnoses:  Bell's palsy    Imaging: MRI without  Assessment/Plan: 36 year old male presents today with likely Bell's palsy.  Patient does not have any other associated neurological deficits, fever, headache, neck stiffness, or any infectious etiology.  Patient is currently being followed by infectious disease.  He is well-appearing now.  With somewhat uncontrolled HIV I do not feel it appropriate to start the patient on steroids at this time.  Patient will follow up with infectious disease for ongoing management for both HIV and question of need for steroids given his HIV status.  Per neurology starting on acyclovir and holding on steroids at this time would be appropriate in this mild case 3 days out.  Patient discharged home with strict return precautions.  He verbalized understanding and agreement to today's plan had no further questions or concerns.   New Prescriptions New Prescriptions   ACYCLOVIR (ZOVIRAX) 400 MG TABLET    Take 1 tablet (400 mg total) by mouth 5 (five) times daily.     Eyvonne Mechanic, PA-C 04/27/17 1644    Canary Brim Tegeler, MD 04/28/17 Kristopher Oppenheim

## 2017-04-27 NOTE — Progress Notes (Signed)
RFV: follow up for hiv disease  Patient ID: Rodney Walker, male   DOB: [birth year], 35 y.o.   MRN: [id number]  HPI Rodney Walker is a 36yo M with well controlled HIV disease ,CD 4 count of 490/VL 413,(feb 2018) Currently on descovy/Prezcobix - he reports taking his meds inconsistently in the day but takes it very day.  Seasonal allergies that became a cold but now improved.he reports having uri roughly 10-14 days ago.  Has new symptoms of tongue and lip numbness x 3 days. He attempted to use a straw today but unable to purse lips. He feels that he has right side of mouth is drawn  Outpatient Encounter Prescriptions as of 04/27/2017  Medication Sig  . DESCOVY 200-25 MG tablet TAKE 1 TABLET BY MOUTH EVERY DAY  . PREZCOBIX 800-150 MG tablet TAKE 1 TABLET BY MOUTH EVERY DAY WITH FOOD  . albuterol (PROVENTIL HFA;VENTOLIN HFA) 108 (90 Base) MCG/ACT inhaler Inhale 4 puffs into the lungs every 4 (four) hours as needed for wheezing or shortness of breath. (Patient not taking: Reported on 04/27/2017)  . aspirin 325 MG tablet Take 650 mg by mouth daily as needed for fever. Reported on 05/28/2016  . beclomethasone (BECONASE AQ) 42 MCG/SPRAY nasal spray Place 2 sprays into both nostrils daily. Dose is for each nostril. (Patient not taking: Reported on 01/28/2017)  . beclomethasone (QVAR) 80 MCG/ACT inhaler Inhale 2 puffs into the lungs 2 (two) times daily. (Patient not taking: Reported on 01/28/2017)  . cetirizine (ZYRTEC) 10 MG tablet Take 10 mg by mouth daily.  Marland Kitchen EPINEPHrine 0.3 mg/0.3 mL IJ SOAJ injection Inject 0.3 mLs (0.3 mg total) into the muscle once. (Patient not taking: Reported on 01/28/2017)  . EPINEPHrine 0.3 mg/0.3 mL IJ SOAJ injection   . ibuprofen (ADVIL,MOTRIN) 200 MG tablet Take 200 mg by mouth every 6 (six) hours as needed (pain). Reported on 05/28/2016  . triamcinolone ointment (KENALOG) 0.1 % Apply 1 application topically 2 (two) times daily. (Patient not taking: Reported on 04/27/2017)    . trimethoprim-polymyxin b (POLYTRIM) ophthalmic solution Place 2 drops into the left eye every 4 (four) hours. For 7-10 days (Patient not taking: Reported on 04/27/2017)   No facility-administered encounter medications on file as of 04/27/2017.      Patient Active Problem List   Diagnosis Date Noted  . Marijuana abuse, continuous 10/29/2016  . Moderate persistent asthma without complication 10/29/2016  . Allergic rhinitis 10/08/2016  . Cough 02/20/2016  . Anemia 05/03/2015  . Lung abscess (HCC) 04/22/2015  . Sepsis due to pneumonia (HCC) 04/17/2015  . Sepsis (HCC) 04/17/2015  . Bullous emphysema (HCC)   . Lobar pneumonia (HCC)   . Hyperkalemia 04/16/2015  . Empyema lung (HCC) 04/16/2015  . PNA (pneumonia) 04/16/2015  . HIV (human immunodeficiency virus infection) (HCC)   . Tobacco abuse 03/21/2015  . Lung mass 08/31/2014  . 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 Myalgia 04/23/2014  . Pharyngitis 03/31/2014  . Hyponatremia 03/31/2014  . Fever 03/30/2014  . AIDS (HCC) 03/22/2014  . Thrush 03/22/2014  . CAP (community acquired pneumonia) 03/22/2014  . CAD (coronary artery disease) 11/13/2011     There are no preventive care reminders to display for this patient.   Review of Systems  Physical Exam   BP 130/75   Pulse 96   Temp 97.8 F (36.6 C) (Oral)   Ht 5\' 2"  (1.575 m)   Wt 134 lb (60.8 kg)   BMI 24.51 kg/m  Physical Exam  Constitutional: He  is oriented to person, place, and time. He appears well-developed and well-nourished. No distress.  HENT:  Mouth/Throat: Oropharynx is clear and moist. No oropharyngeal exudate.  Cardiovascular: Normal rate, regular rhythm and normal heart sounds. Exam reveals no gallop and no friction rub.  No murmur heard.  Pulmonary/Chest: Effort normal and breath sounds normal. No respiratory distress. He has no wheezes.  Abdominal: Soft. Bowel sounds are normal. He exhibits no distension. There is no tenderness.  Lymphadenopathy:  He  has no cervical adenopathy.  Neurological: He is alert and oriented to person, place, and time. Slight facial droop to right corner of mouth while smiling. Unable to puff out cheeks. Asymmetric. CN 2-12 otherwise intact. 5/5 strength Skin: Skin is warm and dry. No rash noted. No erythema.  Psychiatric: He has a normal mood and affect. His behavior is normal.     Lab Results  Component Value Date   CD4TCELL 20 (L) 01/28/2017   Lab Results  Component Value Date   CD4TABS 490 01/28/2017   CD4TABS 500 10/28/2016   CD4TABS 400 05/28/2016   Lab Results  Component Value Date   HIV1RNAQUANT 413 (H) 01/28/2017   No results found for: HEPBSAB Lab Results  Component Value Date   LABRPR NON REAC 02/12/2017    CBC Lab Results  Component Value Date   WBC 7.3 10/28/2016   RBC 4.48 10/28/2016   HGB 14.4 10/28/2016   HCT 42.3 10/28/2016   PLT 269 10/28/2016   MCV 94.4 10/28/2016   MCH 32.1 10/28/2016   MCHC 34.0 10/28/2016   RDW 14.0 10/28/2016   LYMPHSABS 2,555 10/28/2016   MONOABS 584 10/28/2016   EOSABS 73 10/28/2016    BMET Lab Results  Component Value Date   NA 137 05/28/2016   K 4.0 05/28/2016   CL 104 05/28/2016   CO2 27 05/28/2016   GLUCOSE 85 05/28/2016   BUN 11 05/28/2016   CREATININE 1.09 05/28/2016   CALCIUM 9.1 05/28/2016   GFRNONAA 87 05/28/2016   GFRAA >89 05/28/2016      Assessment and Plan  Cranial nerve deficit, new = trigeminal nerve deficit concern for bell's palsy vs. Stroke. Will have him go to the ED for evaluation and management  Long term medication monitoring = patient's kidney function is stable. No need for dose adjustment on hiv meds  hiv disease = still poorly controlled. Will change regimen to bikartvy plus prezcobix. Patient to have counseling by pharmacy

## 2017-04-27 NOTE — ED Notes (Signed)
Patient transported to MRI 

## 2017-04-27 NOTE — ED Triage Notes (Signed)
Pt states 3 days ago he has felt numbness in the left sided of his face with tingling in his left foot.PT also has tingling in his right hand. Pt is alert and ox4. Pt has no other neuro deficits. Pt ambulatory in triage with steady gait.

## 2017-05-27 MED FILL — BIKTARVY 50-200-25 MG TABS: 50-200-25 | 30 days supply | Qty: 30 | Fill #1

## 2017-05-27 MED FILL — PREZCOBIX 800 MG-150 MG TAB: 800-150 | 30 days supply | Qty: 30 | Fill #1

## 2017-05-28 ENCOUNTER — Ambulatory Visit: Payer: Medicare Other

## 2017-05-31 ENCOUNTER — Ambulatory Visit (INDEPENDENT_AMBULATORY_CARE_PROVIDER_SITE_OTHER): Payer: Medicare Other | Admitting: Pharmacist Clinician (PhC)/ Clinical Pharmacy Specialist

## 2017-05-31 DIAGNOSIS — B20 Human immunodeficiency virus [HIV] disease: Secondary | ICD-10-CM | POA: Diagnosis not present

## 2017-05-31 LAB — CBC WITH DIFFERENTIAL/PLATELET
BASOS PCT: 0 %
Basophils Absolute: 0 cells/uL (ref 0–200)
EOS ABS: 67 {cells}/uL (ref 15–500)
EOS PCT: 1 %
HCT: 38.7 % (ref 38.5–50.0)
Hemoglobin: 13.2 g/dL (ref 13.2–17.1)
LYMPHS PCT: 40 %
Lymphs Abs: 2680 cells/uL (ref 850–3900)
MCH: 32.2 pg (ref 27.0–33.0)
MCHC: 34.1 g/dL (ref 32.0–36.0)
MCV: 94.4 fL (ref 80.0–100.0)
MONOS PCT: 9 %
MPV: 9.6 fL (ref 7.5–12.5)
Monocytes Absolute: 603 cells/uL (ref 200–950)
NEUTROS ABS: 3350 {cells}/uL (ref 1500–7800)
Neutrophils Relative %: 50 %
PLATELETS: 240 10*3/uL (ref 140–400)
RBC: 4.1 MIL/uL — ABNORMAL LOW (ref 4.20–5.80)
RDW: 14.7 % (ref 11.0–15.0)
WBC: 6.7 10*3/uL (ref 3.8–10.8)

## 2017-05-31 NOTE — Progress Notes (Signed)
HPI: Rodney Walker is a 36 y.o. male who is here to f/u with [place] after his ED visit.  Allergies: No Known Allergies  Vitals:    Past Medical History: Past Medical History:  Diagnosis Date  . Chest pain 2010  . Coronary artery disease    SPONTANEOUS DISSECTION AND PLAQUE RUPTURE OF HIS LEFT ANTERIOR DESCENDING ARTERY  . Eczema   . Emphysema lung (HCC)   . Empyema lung (HCC)   . HIV infection (HCC)   . Hyperlipidemia   . Immune deficiency disorder (HCC)   . MI (myocardial infarction) (HCC)   . Moderate persistent asthma without complication 10/29/2016    Social History: Social History   Social History  . Marital status: Single    Spouse name: N/A  . Number of children: N/A  . Years of education: N/A   Occupational History  . unemployed    Social History Main Topics  . Smoking status: Former Smoker    Packs/day: 0.25    Years: 17.00    Types: Cigarettes    Quit date: 03/23/2015  . Smokeless tobacco: Never Used     Comment: has cut back significantly, 3 cigs/day.  03/19/15  . Alcohol use No  . Drug use: Yes    Frequency: 2.0 times per week    Types: Marijuana  . Sexual activity: Not Currently    Partners: Male     Comment: given condoms   Other Topics Concern  . Not on file   Social History Narrative  . No narrative on file    Previous Regimen: Prez/descovy  Current Regimen: Biktaryvy/Prez  Labs: HIV 1 RNA Quant (copies/mL)  Date Value  01/28/2017 413 (H)  10/28/2016 294 (H)  09/09/2016 258 (H)   CD4 T Cell Abs (/uL)  Date Value  01/28/2017 490  10/28/2016 500  05/28/2016 400   Hepatitis B Surface Ag (no units)  Date Value  03/22/2014 NEGATIVE   HCV Ab (no units)  Date Value  01/28/2017 NEGATIVE    CrCl: CrCl cannot be calculated (Patient's most recent lab result is older than the maximum 21 days allowed.).  Lipids:    Component Value Date/Time   CHOL 188 02/12/2017 0954   TRIG 77 02/12/2017 0954   HDL 44 02/12/2017  0954   CHOLHDL 4.3 02/12/2017 0954   VLDL 15 02/12/2017 0954   LDLCALC 129 (H) 02/12/2017 0954    Assessment: Rodney Walker recently saw [place] here in the clinic where he had symptoms of facial droop. He was sent to the ED for further evaluation. He was suspected to have Bell's palsy. His brain MRI was neg. Steroids was withheld due to his non suppressed HIV virus. He was given acyclovir instead. He has always had low detectable virus even though he has claimed to take his ART. At this visit, he stated that the symptoms are gone now. Do not see any asymmetry to his facial features.   He has been on his new regimen for about a month now, therefore, we are going to get labs today. He has an appt coming up in July with Dr. Drue Second.   Recommendations:  HIV VL today Cont Biktarvy/prezcobix F/u with Dr. Drue Second in July  Rodney Walker, PharmD, BCPS, AAHIVP, CPP Clinical Infectious Disease Pharmacist [facility] 05/31/2017, 3:44 PM

## 2017-05-31 NOTE — Patient Instructions (Signed)
Come back to see Dr. Drue Second on 7/3 Continue your Biktarvy and Prezcobix

## 2017-06-01 LAB — COMPLETE METABOLIC PANEL WITH GFR
ALBUMIN: 3.9 g/dL (ref 3.6–5.1)
ALK PHOS: 78 U/L (ref 40–115)
ALT: 7 U/L — AB (ref 9–46)
AST: 13 U/L (ref 10–40)
BILIRUBIN TOTAL: 0.5 mg/dL (ref 0.2–1.2)
BUN: 10 mg/dL (ref 7–25)
CALCIUM: 9.1 mg/dL (ref 8.6–10.3)
CO2: 23 mmol/L (ref 20–31)
CREATININE: 1.06 mg/dL (ref 0.60–1.35)
Chloride: 106 mmol/L (ref 98–110)
Glucose, Bld: 83 mg/dL (ref 65–99)
Potassium: 3.8 mmol/L (ref 3.5–5.3)
Sodium: 140 mmol/L (ref 135–146)
TOTAL PROTEIN: 7 g/dL (ref 6.1–8.1)

## 2017-06-02 LAB — HIV-1 RNA QUANT-NO REFLEX-BLD
HIV 1 RNA Quant: 130 copies/mL — ABNORMAL HIGH
HIV-1 RNA QUANT, LOG: 2.11 {Log_copies}/mL — AB

## 2017-06-02 LAB — T-HELPER CELL (CD4) - (RCID CLINIC ONLY)
CD4 % Helper T Cell: 21 % — ABNORMAL LOW (ref 33–55)
CD4 T Cell Abs: 620 /uL (ref 400–2700)

## 2017-06-08 ENCOUNTER — Ambulatory Visit: Payer: Medicare Other | Admitting: Internal Medicine

## 2017-06-24 MED FILL — PREZCOBIX 800 MG-150 MG TAB: 800-150 | 30 days supply | Qty: 30 | Fill #2

## 2017-06-24 MED FILL — BIKTARVY 50-200-25 MG TABS: 50-200-25 | 30 days supply | Qty: 30 | Fill #2

## 2017-06-29 ENCOUNTER — Ambulatory Visit (INDEPENDENT_AMBULATORY_CARE_PROVIDER_SITE_OTHER): Payer: Medicare Other | Admitting: Internal Medicine

## 2017-06-29 ENCOUNTER — Encounter: Payer: Self-pay | Admitting: Internal Medicine

## 2017-06-29 VITALS — BP 123/70 | HR 92 | Temp 98.6°F | Wt 131.0 lb

## 2017-06-29 DIAGNOSIS — B20 Human immunodeficiency virus [HIV] disease: Secondary | ICD-10-CM

## 2017-06-29 NOTE — Progress Notes (Signed)
RFV: follow up for hiv disease  Patient ID: Rodney Walker, male   DOB: 1981-09-03, 36 y.o.   MRN: [id number]  HPI 35yo M with biktarvy-DRVc, CD 4 count 620/VL 130. Only missed 1 dose in the last 30 day. Now has been on this regimen for 2 months. He is tolerating medication without difficulty. He is here with his sister who has moved to town. No other health complaints  Outpatient Encounter Prescriptions as of 06/29/2017  Medication Sig  . acyclovir (ZOVIRAX) 400 MG tablet Take 1 tablet (400 mg total) by mouth 5 (five) times daily.  Marland Kitchen albuterol (PROVENTIL HFA;VENTOLIN HFA) 108 (90 Base) MCG/ACT inhaler Inhale 4 puffs into the lungs every 4 (four) hours as needed for wheezing or shortness of breath.  Marland Kitchen aspirin 325 MG tablet Take 650 mg by mouth daily as needed for fever. Reported on 05/28/2016  . beclomethasone (BECONASE AQ) 42 MCG/SPRAY nasal spray Place 2 sprays into both nostrils daily. Dose is for each nostril.  . beclomethasone (QVAR) 80 MCG/ACT inhaler Inhale 2 puffs into the lungs 2 (two) times daily.  . bictegravir-emtricitabine-tenofovir AF (BIKTARVY) 50-200-25 MG TABS tablet Take 1 tablet by mouth daily with breakfast.  . darunavir-cobicistat (PREZCOBIX) 800-150 MG tablet Take 1 tablet by mouth daily. with food  . ibuprofen (ADVIL,MOTRIN) 200 MG tablet Take 200 mg by mouth every 6 (six) hours as needed for headache or moderate pain. Reported on 05/28/2016  . triamcinolone ointment (KENALOG) 0.1 % Apply 1 application topically 2 (two) times daily.  Marland Kitchen trimethoprim-polymyxin b (POLYTRIM) ophthalmic solution Place 2 drops into the left eye every 4 (four) hours. For 7-10 days  . EPINEPHrine 0.3 mg/0.3 mL IJ SOAJ injection Inject 0.3 mLs (0.3 mg total) into the muscle once.   No facility-administered encounter medications on file as of 06/29/2017.      Patient Active Problem List   Diagnosis Date Noted  . Marijuana abuse, continuous 10/29/2016  . Moderate persistent asthma  without complication 10/29/2016  . Allergic rhinitis 10/08/2016  . Cough 02/20/2016  . Anemia 05/03/2015  . Lung abscess (HCC) 04/22/2015  . Sepsis due to pneumonia (HCC) 04/17/2015  . Sepsis (HCC) 04/17/2015  . Bullous emphysema (HCC)   . Lobar pneumonia (HCC)   . Hyperkalemia 04/16/2015  . Empyema lung (HCC) 04/16/2015  . PNA (pneumonia) 04/16/2015  . HIV (human immunodeficiency virus infection) (HCC)   . Tobacco abuse 03/21/2015  . Lung mass 08/31/2014  . 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 Myalgia 04/23/2014  . Pharyngitis 03/31/2014  . Hyponatremia 03/31/2014  . Fever 03/30/2014  . AIDS (HCC) 03/22/2014  . Thrush 03/22/2014  . CAP (community acquired pneumonia) 03/22/2014  . CAD (coronary artery disease) 11/13/2011   Social History  Substance Use Topics  . Smoking status: Former Smoker    Packs/day: 0.25    Years: 17.00    Types: Cigarettes    Quit date: 03/23/2015  . Smokeless tobacco: Never Used     Comment: has cut back significantly, 3 cigs/day.  03/19/15  . Alcohol use No    There are no preventive care reminders to display for this patient.   Review of Systems 12 point ros is negative Physical Exam   BP 123/70   Pulse 92   Temp 98.6 F (37 C) (Oral)   Wt 131 lb (59.4 kg)   BMI 23.96 kg/m   Physical Exam  Constitutional: He is oriented to person, place, and time. He appears well-developed and well-nourished. No distress.  HENT:  Mouth/Throat: Oropharynx is clear and moist. No oropharyngeal exudate.  Cardiovascular: Normal rate, regular rhythm and normal heart sounds. Exam reveals no gallop and no friction rub.  No murmur heard.  Pulmonary/Chest: Effort normal and breath sounds normal. No respiratory distress. He has no wheezes.  Lymphadenopathy:  He has no cervical adenopathy.  Neurological: He is alert and oriented to person, place, and time.  Skin: Skin is warm and dry. No rash noted. No erythema.  Psychiatric: He has a normal mood and  affect. His behavior is normal.    Lab Results  Component Value Date   CD4TCELL 21 (L) 05/31/2017   Lab Results  Component Value Date   CD4TABS 620 05/31/2017   CD4TABS 490 01/28/2017   CD4TABS 500 10/28/2016   Lab Results  Component Value Date   HIV1RNAQUANT 130 (H) 05/31/2017   No results found for: HEPBSAB Lab Results  Component Value Date   LABRPR NON REAC 02/12/2017    CBC Lab Results  Component Value Date   WBC 6.7 05/31/2017   RBC 4.10 (L) 05/31/2017   HGB 13.2 05/31/2017   HCT 38.7 05/31/2017   PLT 240 05/31/2017   MCV 94.4 05/31/2017   MCH 32.2 05/31/2017   MCHC 34.1 05/31/2017   RDW 14.7 05/31/2017   LYMPHSABS 2,680 05/31/2017   MONOABS 603 05/31/2017   EOSABS 67 05/31/2017    BMET Lab Results  Component Value Date   NA 140 05/31/2017   K 3.8 05/31/2017   CL 106 05/31/2017   CO2 23 05/31/2017   GLUCOSE 83 05/31/2017   BUN 10 05/31/2017   CREATININE 1.06 05/31/2017   CALCIUM 9.1 05/31/2017   GFRNONAA >89 05/31/2017   GFRAA >89 05/31/2017      Assessment and Plan hiv disease = with low level viremia. Recently changed to different regimen. Will check hiv viral load to see if undetectable on new regimen  Spent 30 min with greater than 50% of time on adherence counseling for hiv disease

## 2017-07-02 LAB — HIV-1 RNA QUANT-NO REFLEX-BLD
HIV 1 RNA Quant: 177 copies/mL — ABNORMAL HIGH
HIV-1 RNA QUANT, LOG: 2.25 {Log_copies}/mL — AB

## 2017-07-28 MED FILL — PREZCOBIX 800 MG-150 MG TAB: 800-150 | 30 days supply | Qty: 30 | Fill #3

## 2017-07-28 MED FILL — BIKTARVY 50-200-25 MG TABS: 50-200-25 | 30 days supply | Qty: 30 | Fill #3

## 2017-08-24 MED FILL — PREZCOBIX 800 MG-150 MG TAB: 800-150 | 30 days supply | Qty: 30 | Fill #4

## 2017-08-24 MED FILL — BIKTARVY 50-200-25 MG TABS: 50-200-25 | 30 days supply | Qty: 30 | Fill #4

## 2017-08-26 ENCOUNTER — Other Ambulatory Visit: Payer: Self-pay | Admitting: Pharmacist

## 2017-09-22 ENCOUNTER — Emergency Department (HOSPITAL_COMMUNITY)
Admission: EM | Admit: 2017-09-22 | Discharge: 2017-09-22 | Disposition: A | Discharge status: Home / Self Care | Payer: Medicare Other | Attending: Emergency Medicine | Admitting: Emergency Medicine

## 2017-09-22 ENCOUNTER — Emergency Department (HOSPITAL_COMMUNITY): Payer: Medicare Other

## 2017-09-22 ENCOUNTER — Encounter (HOSPITAL_COMMUNITY): Payer: Self-pay | Admitting: Emergency Medicine

## 2017-09-22 DIAGNOSIS — R05 Cough: Secondary | ICD-10-CM

## 2017-09-22 DIAGNOSIS — I2510 Atherosclerotic heart disease of native coronary artery without angina pectoris: Secondary | ICD-10-CM | POA: Insufficient documentation

## 2017-09-22 DIAGNOSIS — I252 Old myocardial infarction: Secondary | ICD-10-CM | POA: Insufficient documentation

## 2017-09-22 DIAGNOSIS — J4540 Moderate persistent asthma, uncomplicated: Secondary | ICD-10-CM | POA: Diagnosis not present

## 2017-09-22 DIAGNOSIS — Z87891 Personal history of nicotine dependence: Secondary | ICD-10-CM | POA: Insufficient documentation

## 2017-09-22 DIAGNOSIS — R059 Cough, unspecified: Secondary | ICD-10-CM

## 2017-09-22 DIAGNOSIS — Z7982 Long term (current) use of aspirin: Secondary | ICD-10-CM | POA: Insufficient documentation

## 2017-09-22 DIAGNOSIS — Z79899 Other long term (current) drug therapy: Secondary | ICD-10-CM | POA: Insufficient documentation

## 2017-09-22 DIAGNOSIS — J00 Acute nasopharyngitis [common cold]: Secondary | ICD-10-CM | POA: Diagnosis not present

## 2017-09-22 DIAGNOSIS — B20 Human immunodeficiency virus [HIV] disease: Secondary | ICD-10-CM | POA: Insufficient documentation

## 2017-09-22 MED ORDER — ACETAMINOPHEN 500 MG PO TABS
500.0000 mg | ORAL_TABLET | Freq: Once | ORAL | Status: AC
  Administered 2017-09-22: 500 mg via ORAL
  Filled 2017-09-22: qty 1

## 2017-09-22 NOTE — ED Notes (Signed)
Pt states he has had sneezing, coughing, and nasal drip for the past day. Pt reports home fever of 101.2 but is afebrile upon arrival at the emergency department. Pt reports he does have seasonal allergies but is unable to tell the difference.

## 2017-09-22 NOTE — Discharge Instructions (Signed)
Please read instructions below. You can take tylenol as needed for sore throat or body aches. Drink plenty of water. Use a saline nasal spray for congestion.  Begin taking Zyrtec. Follow up with your primary care provider as needed. Return to the ER for difficulty swallowing liquids, difficulty breathing, or new or worsening symptoms.

## 2017-09-22 NOTE — ED Triage Notes (Addendum)
Patient reports persistent productive cough , nasal congestion , coryza /runny nose and  watery eyes unrelieved by OTC medication onset this week , denies fever or chills .

## 2017-09-22 NOTE — ED Provider Notes (Signed)
[facility]PT Provider Note   CSN: [id number] Arrival date & time: 09/22/17  1847     History   Chief Complaint Chief Complaint  Patient presents with  . Cough    HPI Rodney Walker is a 36 y.o. male with past medical history of HIV, MI, emphysema, is any to the ED for acute onset of nasal congestion and yesterday. Patient states symptoms initially began with runny nose and postnasal drip, which caused his throat to become scratchy and sore. He states he began having a cough productive of clear phlegm, as well as watery eyes. He states he took Nighttime cold medication without relief. He does state he has seasonal allergies, and is unsure if this is his seasonal allergies or a cold. Reports subjective fever at home. Denies difficulty breathing or swallowing, chest pain, shortness of breath, abdominal pain, nausea/vomiting, ear pain, or any other complaints. Per chart review, CD4 count 620 in June 2018.   The history is provided by the patient.    Past Medical History:  Diagnosis Date  . Chest pain 2010  . Coronary artery disease    SPONTANEOUS DISSECTION AND PLAQUE RUPTURE OF HIS LEFT ANTERIOR DESCENDING ARTERY  . Eczema   . Emphysema lung (HCC)   . Empyema lung (HCC)   . HIV infection (HCC)   . Hyperlipidemia   . Immune deficiency disorder (HCC)   . MI (myocardial infarction) (HCC)   . Moderate persistent asthma without complication 10/29/2016    Patient Active Problem List   Diagnosis Date Noted  . Marijuana abuse, continuous 10/29/2016  . Moderate persistent asthma without complication 10/29/2016  . Allergic rhinitis 10/08/2016  . Cough 02/20/2016  . Anemia 05/03/2015  . Lung abscess (HCC) 04/22/2015  . Sepsis due to pneumonia (HCC) 04/17/2015  . Sepsis (HCC) 04/17/2015  . Bullous emphysema (HCC)   . Lobar pneumonia (HCC)   . Hyperkalemia 04/16/2015  . Empyema lung (HCC) 04/16/2015  . PNA (pneumonia) 04/16/2015  . HIV (human immunodeficiency virus  infection) (HCC)   . Tobacco abuse 03/21/2015  . Lung mass 08/31/2014  . 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 Myalgia 04/23/2014  . Pharyngitis 03/31/2014  . Hyponatremia 03/31/2014  . Fever 03/30/2014  . AIDS (HCC) 03/22/2014  . Thrush 03/22/2014  . CAP (community acquired pneumonia) 03/22/2014  . CAD (coronary artery disease) 11/13/2011    Past Surgical History:  Procedure Laterality Date  . CARDIAC CATHETERIZATION  06/24/2009   EF 60%  . CARDIAC CATHETERIZATION  06/17/2009   EF 45%. ANTERIOR HYPOKINESIS  . US ECHOCARDIOGRAPHY  12/09/2009   EF 55-60%  . VIDEO ASSISTED THORACOSCOPY (VATS)/ LOBECTOMY Right 04/23/2015   Procedure: VIDEO ASSISTED THORACOSCOPY (VATS)/RIGHT upper  and right middle LOBECTOMY;  Surgeon: Kerin Perna, MD;  Location: [facility] OR;  Service: Thoracic;  Laterality: Right;  Marland Kitchen VIDEO BRONCHOSCOPY Bilateral 09/05/2014   Procedure: VIDEO BRONCHOSCOPY WITH FLUORO;  Surgeon: Merwyn Katos, MD;  Location: [facility] ENDOSCOPY;  Service: Cardiopulmonary;  Laterality: Bilateral;  . VIDEO BRONCHOSCOPY Bilateral 04/10/2015   Procedure: VIDEO BRONCHOSCOPY WITH FLUORO;  Surgeon: Lupita Leash, MD;  Location: [facility] ENDOSCOPY;  Service: Cardiopulmonary;  Laterality: Bilateral;       Home Medications    Prior to Admission medications   Medication Sig Start Date End Date Taking? Authorizing Provider  acyclovir (ZOVIRAX) 400 MG tablet Take 1 tablet (400 mg total) by mouth 5 (five) times daily. 04/27/17   Hedges, Tinnie Gens, PA-C  albuterol (PROVENTIL HFA;VENTOLIN HFA) 108 (90 Base) MCG/ACT inhaler  Inhale 4 puffs into the lungs every 4 (four) hours as needed for wheezing or shortness of breath. 10/29/16   Alfonse Spruce, MD  aspirin 325 MG tablet Take 650 mg by mouth daily as needed for fever. Reported on 05/28/2016    [provider]  beclomethasone (BECONASE AQ) 42 MCG/SPRAY nasal spray Place 2 sprays into both nostrils daily. Dose is for each nostril. 10/29/16   Alfonse Spruce, MD  beclomethasone (QVAR) 80 MCG/ACT inhaler Inhale 2 puffs into the lungs 2 (two) times daily. 10/29/16   Alfonse Spruce, MD  bictegravir-emtricitabine-tenofovir AF (BIKTARVY) 50-200-25 MG TABS tablet Take 1 tablet by mouth daily with breakfast. 04/27/17   Judyann Munson, MD  darunavir-cobicistat (PREZCOBIX) 800-150 MG tablet Take 1 tablet by mouth daily. with food 04/27/17   Judyann Munson, MD  EPINEPHrine 0.3 mg/0.3 mL IJ SOAJ injection Inject 0.3 mLs (0.3 mg total) into the muscle once. 10/29/16 04/27/17  Alfonse Spruce, MD  ibuprofen (ADVIL,MOTRIN) 200 MG tablet Take 200 mg by mouth every 6 (six) hours as needed for headache or moderate pain. Reported on 05/28/2016    [provider]  triamcinolone ointment (KENALOG) 0.1 % Apply 1 application topically 2 (two) times daily. 02/01/17   Judyann Munson, MD  trimethoprim-polymyxin b (POLYTRIM) ophthalmic solution Place 2 drops into the left eye every 4 (four) hours. For 7-10 days 06/13/16   Jerre Simon, PA    Family History Family History  Problem Relation Age of Onset  . Diabetes Mother   . Eczema Mother   . Asthma Mother   . Liver cancer Father   . Asthma Brother     Social History Social History  Substance Use Topics  . Smoking status: Former Smoker    Packs/day: 0.25    Years: 17.00    Types: Cigarettes    Quit date: 03/23/2015  . Smokeless tobacco: Never Used     Comment: has cut back significantly, 3 cigs/day.  03/19/15  . Alcohol use No     Allergies   Patient has no known allergies.   Review of Systems Review of Systems  Constitutional: Positive for fever (Subjective).  HENT: Positive for congestion, postnasal drip, rhinorrhea, sneezing and sore throat. Negative for ear pain, trouble swallowing and voice change.   Eyes: Positive for discharge (Watery).  Respiratory: Positive for cough. Negative for choking, shortness of breath and stridor.   Cardiovascular: Negative for chest pain.    Gastrointestinal: Negative for abdominal pain, nausea and vomiting.  All other systems reviewed and are negative.    Physical Exam Updated Vital Signs BP 131/87 (BP Location: Right Arm)   Pulse 91   Temp 98.9 F (37.2 C) (Oral)   Resp 20   Ht 5\' 4"  (1.626 m)   Wt 61.2 kg (135 lb)   SpO2 99%   BMI 23.17 kg/m   Physical Exam  Constitutional: He appears well-developed and well-nourished. No distress.  Well-appearing, tolerating secretions. No increased work of breathing. Patient's sounds congested in the nose.  HENT:  Head: Normocephalic and atraumatic.  Right Ear: Hearing, tympanic membrane, external ear and ear canal normal.  Left Ear: Hearing, tympanic membrane, external ear and ear canal normal.  Nose: Right sinus exhibits no maxillary sinus tenderness and no frontal sinus tenderness. Left sinus exhibits no maxillary sinus tenderness and no frontal sinus tenderness.  Mouth/Throat: Uvula is midline and oropharynx is clear and moist. No trismus in the jaw. No uvula swelling.  Eyes: Pupils are  equal, round, and reactive to light. Conjunctivae and EOM are normal.  Neck: Normal range of motion. Neck supple. No tracheal deviation present.  Cardiovascular: Normal rate, regular rhythm, normal heart sounds and intact distal pulses.  Exam reveals no friction rub.   No murmur heard. Pulmonary/Chest: Effort normal and breath sounds normal. No stridor. No respiratory distress. He has no wheezes. He has no rales. He exhibits no tenderness.  Abdominal: Soft.  Lymphadenopathy:    He has no cervical adenopathy.  Skin: Skin is warm.  Psychiatric: He has a normal mood and affect. His behavior is normal.  Nursing note and vitals reviewed.    ED Treatments / Results  Labs (all labs ordered are listed, but only abnormal results are displayed) Labs Reviewed - No data to display  EKG  EKG Interpretation None       Radiology Dg Chest 2 View  Result Date: 09/22/2017 CLINICAL DATA:   Cough fever sob fatigue x2 days hx of HIV and COPD EXAM: CHEST  2 VIEW COMPARISON:  05/16/2016 FINDINGS: There stable changes from right lung surgery. There are surgical vascular clips and anastomosis staples along the right hilum, scarring at right lung base and mild volume loss. Right lung otherwise clear. Left lung is hyperexpanded but clear. No pleural effusion.  No pneumothorax. Cardiac silhouette is normal in size. No mediastinal or hilar masses. No convincing adenopathy. O chronic right posterolateral fifth rib fracture, stable. Skeletal structures are otherwise unremarkable. IMPRESSION: 1. No acute cardiopulmonary disease. Stable appearance from the prior study. Electronically Signed   By: Amie Portland M.D.   On: 09/22/2017 21:13    Procedures Procedures (including critical care time)  Medications Ordered in ED Medications  acetaminophen (TYLENOL) tablet 500 mg (500 mg Oral Given 09/22/17 2136)     Initial Impression / Assessment and Plan / ED Course  I have reviewed the triage vital signs and the nursing notes.  Pertinent labs & imaging results that were available during my care of the patient were reviewed by me and considered in my medical decision making (see chart for details).     Patients symptoms are consistent with seasonal allergies vs viral URI. Pt CXR negative for acute infiltrate. Afebrile, tolerating secretions. Discussed that antibiotics are not indicated for viral infections. CD4 in June 2018 was 620. Pt will be discharged with symptomatic treatment.  Verbalizes understanding and is agreeable with plan. Pt is hemodynamically stable & in NAD prior to dc.  Patient discussed with and seen by Dr. Dalene Seltzer.  Discussed results, findings, treatment and follow up. Patient advised of return precautions. Patient verbalized understanding and agreed with plan.   Final Clinical Impressions(s) / ED Diagnoses   Final diagnoses:  Acute rhinitis  Cough    New  Prescriptions New Prescriptions   No medications on file     Russo, [place] N, PA-C 09/22/17 2149    Alvira Monday, MD 09/23/17 [phone]

## 2017-09-25 ENCOUNTER — Emergency Department (HOSPITAL_COMMUNITY)
Admission: EM | Admit: 2017-09-25 | Discharge: 2017-09-25 | Disposition: A | Discharge status: Home / Self Care | Payer: Medicare Other | Attending: Emergency Medicine | Admitting: Emergency Medicine

## 2017-09-25 ENCOUNTER — Emergency Department (HOSPITAL_COMMUNITY): Payer: Medicare Other

## 2017-09-25 ENCOUNTER — Encounter (HOSPITAL_COMMUNITY): Payer: Self-pay | Admitting: Emergency Medicine

## 2017-09-25 DIAGNOSIS — Z21 Asymptomatic human immunodeficiency virus [HIV] infection status: Secondary | ICD-10-CM | POA: Insufficient documentation

## 2017-09-25 DIAGNOSIS — J189 Pneumonia, unspecified organism: Secondary | ICD-10-CM | POA: Insufficient documentation

## 2017-09-25 DIAGNOSIS — Z79899 Other long term (current) drug therapy: Secondary | ICD-10-CM | POA: Diagnosis not present

## 2017-09-25 DIAGNOSIS — I2510 Atherosclerotic heart disease of native coronary artery without angina pectoris: Secondary | ICD-10-CM | POA: Diagnosis not present

## 2017-09-25 DIAGNOSIS — Z87891 Personal history of nicotine dependence: Secondary | ICD-10-CM | POA: Diagnosis not present

## 2017-09-25 DIAGNOSIS — I252 Old myocardial infarction: Secondary | ICD-10-CM | POA: Diagnosis not present

## 2017-09-25 DIAGNOSIS — R05 Cough: Secondary | ICD-10-CM | POA: Diagnosis present

## 2017-09-25 DIAGNOSIS — J181 Lobar pneumonia, unspecified organism: Secondary | ICD-10-CM

## 2017-09-25 LAB — URINALYSIS, ROUTINE W REFLEX MICROSCOPIC
BACTERIA UA: NONE SEEN
Bilirubin Urine: NEGATIVE
GLUCOSE, UA: NEGATIVE mg/dL
KETONES UR: NEGATIVE mg/dL
Leukocytes, UA: NEGATIVE
NITRITE: NEGATIVE
PROTEIN: 100 mg/dL — AB
Specific Gravity, Urine: 1.018 (ref 1.005–1.030)
pH: 5 (ref 5.0–8.0)

## 2017-09-25 LAB — CBC WITH DIFFERENTIAL/PLATELET
BASOS ABS: 0 10*3/uL (ref 0.0–0.1)
Basophils Relative: 0 %
EOS PCT: 0 %
Eosinophils Absolute: 0 10*3/uL (ref 0.0–0.7)
HCT: 42.7 % (ref 39.0–52.0)
Hemoglobin: 15.6 g/dL (ref 13.0–17.0)
LYMPHS ABS: 2.5 10*3/uL (ref 0.7–4.0)
Lymphocytes Relative: 23 %
MCH: 31.8 pg (ref 26.0–34.0)
MCHC: 36.5 g/dL — ABNORMAL HIGH (ref 30.0–36.0)
MCV: 87.1 fL (ref 78.0–100.0)
MONO ABS: 1.1 10*3/uL — AB (ref 0.1–1.0)
Monocytes Relative: 10 %
Neutro Abs: 7.2 10*3/uL (ref 1.7–7.7)
Neutrophils Relative %: 67 %
PLATELETS: 154 10*3/uL (ref 150–400)
RBC: 4.9 MIL/uL (ref 4.22–5.81)
RDW: 13.7 % (ref 11.5–15.5)
WBC: 10.8 10*3/uL — AB (ref 4.0–10.5)

## 2017-09-25 LAB — COMPREHENSIVE METABOLIC PANEL
ALT: 14 U/L — ABNORMAL LOW (ref 17–63)
ANION GAP: 9 (ref 5–15)
AST: 19 U/L (ref 15–41)
Albumin: 3.6 g/dL (ref 3.5–5.0)
Alkaline Phosphatase: 72 U/L (ref 38–126)
BUN: 11 mg/dL (ref 6–20)
CHLORIDE: 103 mmol/L (ref 101–111)
CO2: 23 mmol/L (ref 22–32)
Calcium: 9 mg/dL (ref 8.9–10.3)
Creatinine, Ser: 1.15 mg/dL (ref 0.61–1.24)
GFR calc Af Amer: >60 mL/min (ref >60)
Glucose, Bld: 103 mg/dL — ABNORMAL HIGH (ref 65–99)
POTASSIUM: 3.2 mmol/L — AB (ref 3.5–5.1)
Sodium: 135 mmol/L (ref 135–145)
Total Bilirubin: 1.1 mg/dL (ref 0.3–1.2)
Total Protein: 7.7 g/dL (ref 6.5–8.1)

## 2017-09-25 LAB — I-STAT CG4 LACTIC ACID, ED: LACTIC ACID, VENOUS: 0.51 mmol/L (ref 0.5–1.9)

## 2017-09-25 MED ORDER — SODIUM CHLORIDE 0.9 % IV BOLUS (SEPSIS)
1000.0000 mL | Freq: Once | INTRAVENOUS | Status: AC
  Administered 2017-09-25: 1000 mL via INTRAVENOUS

## 2017-09-25 MED ORDER — LEVOFLOXACIN IN D5W 750 MG/150ML IV SOLN
750.0000 mg | Freq: Once | INTRAVENOUS | Status: AC
  Administered 2017-09-25: 750 mg via INTRAVENOUS
  Filled 2017-09-25: qty 150

## 2017-09-25 MED ORDER — KETOROLAC TROMETHAMINE 30 MG/ML IJ SOLN
30.0000 mg | Freq: Once | INTRAMUSCULAR | Status: AC
  Administered 2017-09-25: 30 mg via INTRAVENOUS
  Filled 2017-09-25: qty 1

## 2017-09-25 MED ORDER — LEVOFLOXACIN 750 MG PO TABS: 750.0000 mg | ORAL_TABLET | Freq: Every day | ORAL | 0 refills | Status: DC

## 2017-09-25 NOTE — ED Notes (Signed)
Patient transported to X-ray 

## 2017-09-25 NOTE — Discharge Instructions (Signed)
Stay hydrated.   Take tylenol, motrin for fever.   Take levaquin daily for a week.   See your doctor in a week   Return to ER if you have worse cough, fevers, trouble breathing.

## 2017-09-25 NOTE — ED Triage Notes (Signed)
Pt. Stated, I was here 3 days ago for the same symptoms and they said to come back.  Im no better body aches, nasal congestion and headache

## 2017-09-25 NOTE — ED Provider Notes (Signed)
[facility] Provider Note   CSN: [id number] Arrival date & time: 09/25/17  0736     History   Chief Complaint Chief Complaint  Patient presents with  . Nasal Congestion  . Generalized Body Aches    HPI Rodney Walker is a 36 y.o. male history of HIV (CD 4 620 a month ago, compliant with meds), here presenting with persistent cough, chills, fever. Patient has been coughing for the last several days.  Came in 3 days ago and had a negative chest x-ray was thought to have seasonal allergies or viral syndrome so was not sent home with any antibiotics. Patient states that for the last several days, he has persistent cough and congestion. Also developed fever 103 yesterday and took some Tylenol prior to arrival. Patient states that his cough is becoming more productive and has been coughing up yellowish sputum. Denies any vomiting or urinary symptoms. Patient states that he is compliant with his HIV meds and CD 4 count is 620 a month ago.   The history is provided by the patient.    Past Medical History:  Diagnosis Date  . Chest pain 2010  . Coronary artery disease    SPONTANEOUS DISSECTION AND PLAQUE RUPTURE OF HIS LEFT ANTERIOR DESCENDING ARTERY  . Eczema   . Emphysema lung (HCC)   . Empyema lung (HCC)   . HIV infection (HCC)   . Hyperlipidemia   . Immune deficiency disorder (HCC)   . MI (myocardial infarction) (HCC)   . Moderate persistent asthma without complication 10/29/2016    Patient Active Problem List   Diagnosis Date Noted  . Marijuana abuse, continuous 10/29/2016  . Moderate persistent asthma without complication 10/29/2016  . Allergic rhinitis 10/08/2016  . Cough 02/20/2016  . Anemia 05/03/2015  . Lung abscess (HCC) 04/22/2015  . Sepsis due to pneumonia (HCC) 04/17/2015  . Sepsis (HCC) 04/17/2015  . Bullous emphysema (HCC)   . Lobar pneumonia (HCC)   . Hyperkalemia 04/16/2015  . Empyema lung (HCC) 04/16/2015  . PNA (pneumonia) 04/16/2015  .  HIV (human immunodeficiency virus infection) (HCC)   . Tobacco abuse 03/21/2015  . Lung mass 08/31/2014  . 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 Myalgia 04/23/2014  . Pharyngitis 03/31/2014  . Hyponatremia 03/31/2014  . Fever 03/30/2014  . AIDS (HCC) 03/22/2014  . Thrush 03/22/2014  . CAP (community acquired pneumonia) 03/22/2014  . CAD (coronary artery disease) 11/13/2011    Past Surgical History:  Procedure Laterality Date  . CARDIAC CATHETERIZATION  06/24/2009   EF 60%  . CARDIAC CATHETERIZATION  06/17/2009   EF 45%. ANTERIOR HYPOKINESIS  . US ECHOCARDIOGRAPHY  12/09/2009   EF 55-60%  . VIDEO ASSISTED THORACOSCOPY (VATS)/ LOBECTOMY Right 04/23/2015   Procedure: VIDEO ASSISTED THORACOSCOPY (VATS)/RIGHT upper  and right middle LOBECTOMY;  Surgeon: Kerin Perna, MD;  Location: [facility] OR;  Service: Thoracic;  Laterality: Right;  Marland Kitchen VIDEO BRONCHOSCOPY Bilateral 09/05/2014   Procedure: VIDEO BRONCHOSCOPY WITH FLUORO;  Surgeon: Merwyn Katos, MD;  Location: [facility] ENDOSCOPY;  Service: Cardiopulmonary;  Laterality: Bilateral;  . VIDEO BRONCHOSCOPY Bilateral 04/10/2015   Procedure: VIDEO BRONCHOSCOPY WITH FLUORO;  Surgeon: Lupita Leash, MD;  Location: [facility] ENDOSCOPY;  Service: Cardiopulmonary;  Laterality: Bilateral;       Home Medications    Prior to Admission medications   Medication Sig Start Date End Date Taking? Authorizing Provider  acyclovir (ZOVIRAX) 400 MG tablet Take 1 tablet (400 mg total) by mouth 5 (five) times daily. 04/27/17   Hedges,  Tinnie Gens, PA-C  albuterol (PROVENTIL HFA;VENTOLIN HFA) 108 (90 Base) MCG/ACT inhaler Inhale 4 puffs into the lungs every 4 (four) hours as needed for wheezing or shortness of breath. 10/29/16   Alfonse Spruce, MD  aspirin 325 MG tablet Take 650 mg by mouth daily as needed for fever. Reported on 05/28/2016    [provider]  beclomethasone (BECONASE AQ) 42 MCG/SPRAY nasal spray Place 2 sprays into both nostrils daily. Dose is for  each nostril. 10/29/16   Alfonse Spruce, MD  beclomethasone (QVAR) 80 MCG/ACT inhaler Inhale 2 puffs into the lungs 2 (two) times daily. 10/29/16   Alfonse Spruce, MD  bictegravir-emtricitabine-tenofovir AF (BIKTARVY) 50-200-25 MG TABS tablet Take 1 tablet by mouth daily with breakfast. 04/27/17   Judyann Munson, MD  darunavir-cobicistat (PREZCOBIX) 800-150 MG tablet Take 1 tablet by mouth daily. with food 04/27/17   Judyann Munson, MD  EPINEPHrine 0.3 mg/0.3 mL IJ SOAJ injection Inject 0.3 mLs (0.3 mg total) into the muscle once. 10/29/16 04/27/17  Alfonse Spruce, MD  ibuprofen (ADVIL,MOTRIN) 200 MG tablet Take 200 mg by mouth every 6 (six) hours as needed for headache or moderate pain. Reported on 05/28/2016    [provider]  triamcinolone ointment (KENALOG) 0.1 % Apply 1 application topically 2 (two) times daily. 02/01/17   Judyann Munson, MD  trimethoprim-polymyxin b (POLYTRIM) ophthalmic solution Place 2 drops into the left eye every 4 (four) hours. For 7-10 days 06/13/16   Jerre Simon, PA    Family History Family History  Problem Relation Age of Onset  . Diabetes Mother   . Eczema Mother   . Asthma Mother   . Liver cancer Father   . Asthma Brother     Social History Social History  Substance Use Topics  . Smoking status: Former Smoker    Packs/day: 0.25    Years: 17.00    Types: Cigarettes    Quit date: 03/23/2015  . Smokeless tobacco: Never Used     Comment: has cut back significantly, 3 cigs/day.  03/19/15  . Alcohol use No     Allergies   Patient has no known allergies.   Review of Systems Review of Systems  Constitutional: Positive for fever.  Respiratory: Positive for cough.   All other systems reviewed and are negative.    Physical Exam Updated Vital Signs BP 108/69   Pulse 73   Temp 98.4 F (36.9 C) (Oral)   Resp 16   Ht  (1.626 m)   Wt 61.2 kg (135 lb)   SpO2 99%   BMI 23.17 kg/m   Physical Exam  Constitutional: He  is oriented to person, place, and time.  Chronically ill   HENT:  Head: Normocephalic.  Right Ear: External ear normal.  Left Ear: External ear normal.  MM slightly dry   Eyes: Pupils are equal, round, and reactive to light. Conjunctivae and EOM are normal.  Neck: Normal range of motion. Neck supple.  Cardiovascular: Normal rate, regular rhythm and normal heart sounds.   Pulmonary/Chest: Effort normal and breath sounds normal. No respiratory distress. He has no wheezes. He has no rales.  Abdominal: Soft. Bowel sounds are normal. He exhibits no distension. There is no tenderness. There is no guarding.  Musculoskeletal: Normal range of motion. He exhibits no edema.  Neurological: He is alert and oriented to person, place, and time. No cranial nerve deficit. Coordination normal.  Skin: Skin is warm.  Psychiatric: He has a normal mood and  affect. His behavior is normal.  Nursing note and vitals reviewed.    ED Treatments / Results  Labs (all labs ordered are listed, but only abnormal results are displayed) Labs Reviewed  CBC WITH DIFFERENTIAL/PLATELET - Abnormal; Notable for the following:       Result Value   WBC 10.8 (*)    MCHC 36.5 (*)    Monocytes Absolute 1.1 (*)    All other components within normal limits  COMPREHENSIVE METABOLIC PANEL - Abnormal; Notable for the following:    Potassium 3.2 (*)    Glucose, Bld 103 (*)    ALT 14 (*)    All other components within normal limits  URINALYSIS, ROUTINE W REFLEX MICROSCOPIC - Abnormal; Notable for the following:    Hgb urine dipstick SMALL (*)    Protein, ur 100 (*)    Squamous Epithelial / LPF 0-5 (*)    All other components within normal limits  CULTURE, BLOOD (ROUTINE X 2)  CULTURE, BLOOD (ROUTINE X 2)  URINE CULTURE  I-STAT CG4 LACTIC ACID, ED  I-STAT CG4 LACTIC ACID, ED    EKG  EKG Interpretation None       Radiology Dg Chest 2 View  Result Date: 09/25/2017 CLINICAL DATA:  36 year old male with a history of  flu-like symptoms with cough. History of prior empyema with VATS. Prior right lobectomy 04/23/2015 EXAM: CHEST  2 VIEW COMPARISON:  CT 04/16/2015, chest x-ray 09/22/2017, 05/16/2016 FINDINGS: Cardiomediastinal silhouette unchanged in size and contour. Surgical changes in the right hilar region with architectural distortion. Scarring at the right lung base with peaked appearance of the diaphragm. Asymmetric elevation the right hemidiaphragm. Interstitial opacities of the right lung, with perihilar opacity. No large pleural effusion. No pneumothorax. Posttraumatic/post surgical of the right posterior ribs. IMPRESSION: Right hilar architectural distortion and interstitial opacities, adjacent to the surgical changes, may simply represent chronic scarring, however, atypical infection not excluded. Partially healed posterior right ribs, likely related to history of prior VATS and lobectomy. Electronically Signed   By: Gilmer Mor D.O.   On: 09/25/2017 10:08    Procedures Procedures (including critical care time)  Medications Ordered in ED Medications  levofloxacin (LEVAQUIN) IVPB 750 mg (750 mg Intravenous New Bag/Given 09/25/17 1124)  sodium chloride 0.9 % bolus 1,000 mL (1,000 mLs Intravenous New Bag/Given 09/25/17 1123)  ketorolac (TORADOL) 30 MG/ML injection 30 mg (30 mg Intravenous Given 09/25/17 1124)     Initial Impression / Assessment and Plan / ED Course  I have reviewed the triage vital signs and the nursing notes.  Pertinent labs & imaging results that were available during my care of the patient were reviewed by me and considered in my medical decision making (see chart for details).     Rodney Walker is a 36 y.o. male hx of HIV with nl CD4 count, here with cough, fever. Consider pneumonia vs UTI vs viral syndrome. He has nl CD4 count so I doubt opportunist infection. Will get sepsis workup with labs, CXR, UA. Will hydrate and reassess.   12:31 PM CXR showed R sided pneumonia  vs atypical infection. WBC 10, lactate nl. UA nl. Given hx of HIV, will give levaquin. His CD4 is 620 so I doubt PCP pneumonia. He is not hypoxic, vitals stable. Can be discharged with outpatient follow up.    Final Clinical Impressions(s) / ED Diagnoses   Final diagnoses:  None    New Prescriptions New Prescriptions   No medications on file  Charlynne Pander, MD 09/25/17 [phone]

## 2017-09-25 NOTE — ED Notes (Signed)
Pt. returned from XR. 

## 2017-09-26 LAB — URINE CULTURE: Culture: <10000 — AB

## 2017-09-27 MED FILL — PREZCOBIX 800 MG-150 MG TAB: 800-150 | 30 days supply | Qty: 30 | Fill #5

## 2017-09-27 MED FILL — BIKTARVY 50-200-25 MG TABS: 50-200-25 | 30 days supply | Qty: 30 | Fill #5

## 2017-09-29 ENCOUNTER — Ambulatory Visit (INDEPENDENT_AMBULATORY_CARE_PROVIDER_SITE_OTHER): Payer: Medicare Other | Admitting: Internal Medicine

## 2017-09-29 ENCOUNTER — Encounter: Payer: Self-pay | Admitting: Internal Medicine

## 2017-09-29 VITALS — BP 110/74 | HR 89 | Temp 98.7°F | Ht 64.0 in | Wt 138.0 lb

## 2017-09-29 DIAGNOSIS — B20 Human immunodeficiency virus [HIV] disease: Secondary | ICD-10-CM

## 2017-09-29 DIAGNOSIS — J181 Lobar pneumonia, unspecified organism: Secondary | ICD-10-CM

## 2017-09-29 DIAGNOSIS — J189 Pneumonia, unspecified organism: Secondary | ICD-10-CM

## 2017-09-29 NOTE — Progress Notes (Signed)
RFV: follow up for hiv disease  Patient ID: Rodney Walker, male   DOB: 03-Aug-1981, 36 y.o.   MRN: [id number]  HPI Rodney Walker is a 36yo M with hiv disease, currently on biktarvy-prezcobix. Cd 4 count of 620/VL 177. He has worsening cough fever over the last week. Dx with CAP currently on levofloxacin. Feeling better since starting abtx.no longer having fever. No other illnesses since we last saw him. He reports having sick contact. 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 Walks a fair distance from bus stop to and from work. No wheezing.  I reviewed cxr from ED that shows possible Right sided infiltrate  Outpatient Encounter Prescriptions as of 09/29/2017  Medication Sig  . bictegravir-emtricitabine-tenofovir AF (BIKTARVY) 50-200-25 MG TABS tablet Take 1 tablet by mouth daily with breakfast.  . darunavir-cobicistat (PREZCOBIX) 800-150 MG tablet Take 1 tablet by mouth daily. with food  . ibuprofen (ADVIL,MOTRIN) 200 MG tablet Take 200 mg by mouth every 6 (six) hours as needed for headache or moderate pain. Reported on 05/28/2016  . levofloxacin (LEVAQUIN) 750 MG tablet Take 1 tablet (750 mg total) by mouth daily. X 7 days  . [DISCONTINUED] acyclovir (ZOVIRAX) 400 MG tablet Take 1 tablet (400 mg total) by mouth 5 (five) times daily.  . [DISCONTINUED] aspirin 325 MG tablet Take 650 mg by mouth daily as needed for fever. Reported on 05/28/2016  . albuterol (PROVENTIL HFA;VENTOLIN HFA) 108 (90 Base) MCG/ACT inhaler Inhale 4 puffs into the lungs every 4 (four) hours as needed for wheezing or shortness of breath. (Patient not taking: Reported on 09/29/2017)  . beclomethasone (BECONASE AQ) 42 MCG/SPRAY nasal spray Place 2 sprays into both nostrils daily. Dose is for each nostril. (Patient not taking: Reported on 09/29/2017)  . beclomethasone (QVAR) 80 MCG/ACT inhaler Inhale 2 puffs into the lungs 2 (two) times daily. (Patient not taking: Reported on 09/29/2017)  . EPINEPHrine 0.3 mg/0.3 mL IJ SOAJ injection  Inject 0.3 mLs (0.3 mg total) into the muscle once.  . triamcinolone ointment (KENALOG) 0.1 % Apply 1 application topically 2 (two) times daily. (Patient not taking: Reported on 09/29/2017)  . [DISCONTINUED] trimethoprim-polymyxin b (POLYTRIM) ophthalmic solution Place 2 drops into the left eye every 4 (four) hours. For 7-10 days   No facility-administered encounter medications on file as of 09/29/2017.      Patient Active Problem List   Diagnosis Date Noted  . Marijuana abuse, continuous 10/29/2016  . Moderate persistent asthma without complication 10/29/2016  . Allergic rhinitis 10/08/2016  . Cough 02/20/2016  . Anemia 05/03/2015  . Lung abscess (HCC) 04/22/2015  . Sepsis due to pneumonia (HCC) 04/17/2015  . Sepsis (HCC) 04/17/2015  . Bullous emphysema (HCC)   . Lobar pneumonia (HCC)   . Hyperkalemia 04/16/2015  . Empyema lung (HCC) 04/16/2015  . PNA (pneumonia) 04/16/2015  . HIV (human immunodeficiency virus infection) (HCC)   . Tobacco abuse 03/21/2015  . Lung mass 08/31/2014  . 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 Myalgia 04/23/2014  . Pharyngitis 03/31/2014  . Hyponatremia 03/31/2014  . Fever 03/30/2014  . AIDS (HCC) 03/22/2014  . Thrush 03/22/2014  . CAP (community acquired pneumonia) 03/22/2014  . CAD (coronary artery disease) 11/13/2011     Health Maintenance Due  Topic Date Due  . INFLUENZA VACCINE  07/28/2017     Review of Systems Review of Systems  Constitutional: Negative for fever, chills, diaphoresis, activity change, appetite change, fatigue and unexpected weight change.  HENT: Negative for congestion, sore throat, rhinorrhea, sneezing, trouble swallowing and  sinus pressure.  Eyes: Negative for photophobia and visual disturbance.  Respiratory: + cough Cardiovascular: Negative for chest pain, palpitations and leg swelling.  Gastrointestinal: Negative for nausea, vomiting, abdominal pain, diarrhea, constipation, blood in stool, abdominal distention and  anal bleeding.  Genitourinary: Negative for dysuria, hematuria, flank pain and difficulty urinating.  Musculoskeletal: Negative for myalgias, back pain, joint swelling, arthralgias and gait problem.  Skin: Negative for color change, pallor, rash and wound.  Neurological: Negative for dizziness, tremors, weakness and light-headedness.  Hematological: Negative for adenopathy. Does not bruise/bleed easily.  Psychiatric/Behavioral: Negative for behavioral problems, confusion, sleep disturbance, dysphoric mood, decreased concentration and agitation.    Physical Exam   BP 110/74   Pulse 89   Temp 98.7 F (37.1 C) (Oral)   Ht  (1.626 m)   Wt 138 lb (62.6 kg)   BMI 23.69 kg/m    Lab Results  Component Value Date   CD4TCELL 21 (L) 05/31/2017   Lab Results  Component Value Date   CD4TABS 620 05/31/2017   CD4TABS 490 01/28/2017   CD4TABS 500 10/28/2016   Lab Results  Component Value Date   HIV1RNAQUANT 177 (H) 06/29/2017   No results found for: HEPBSAB Lab Results  Component Value Date   LABRPR NON REAC 02/12/2017    CBC Lab Results  Component Value Date   WBC 10.8 (H) 09/25/2017   RBC 4.90 09/25/2017   HGB 15.6 09/25/2017   HCT 42.7 09/25/2017   PLT 154 09/25/2017   MCV 87.1 09/25/2017   MCH 31.8 09/25/2017   MCHC 36.5 (H) 09/25/2017   RDW 13.7 09/25/2017   LYMPHSABS 2.5 09/25/2017   MONOABS 1.1 (H) 09/25/2017   EOSABS 0.0 09/25/2017    BMET Lab Results  Component Value Date   NA 135 09/25/2017   K 3.2 (L) 09/25/2017   CL 103 09/25/2017   CO2 23 09/25/2017   GLUCOSE 103 (H) 09/25/2017   BUN 11 09/25/2017   CREATININE 1.15 09/25/2017   CALCIUM 9.0 09/25/2017   GFRNONAA >60 09/25/2017   GFRAA >60 09/25/2017      Assessment and Plan Health maintenance = given that he is taking abtx for CAP would give time for him to recover from current illness. And  Will give flu shot next week  CAP= Finish abtx for cap Will see him back in 4 wk to assess  recovery from pna  HIV disease= he has low level viremia despit stating that he is taking his ART. We Will check vl today

## 2017-09-30 LAB — CULTURE, BLOOD (ROUTINE X 2)
CULTURE: NO GROWTH
Culture: NO GROWTH
SPECIAL REQUESTS: ADEQUATE
SPECIAL REQUESTS: ADEQUATE

## 2017-10-01 LAB — HIV-1 RNA QUANT-NO REFLEX-BLD
HIV 1 RNA QUANT: 55 {copies}/mL — AB
HIV-1 RNA Quant, Log: 1.74 Log copies/mL — ABNORMAL HIGH

## 2017-10-06 ENCOUNTER — Ambulatory Visit (INDEPENDENT_AMBULATORY_CARE_PROVIDER_SITE_OTHER): Payer: Medicare Other

## 2017-10-06 DIAGNOSIS — Z23 Encounter for immunization: Secondary | ICD-10-CM | POA: Diagnosis not present

## 2017-10-20 ENCOUNTER — Other Ambulatory Visit: Payer: Self-pay | Admitting: Internal Medicine

## 2017-10-20 DIAGNOSIS — B20 Human immunodeficiency virus [HIV] disease: Secondary | ICD-10-CM

## 2017-10-26 ENCOUNTER — Encounter (HOSPITAL_COMMUNITY): Payer: Self-pay | Admitting: Family Medicine

## 2017-10-26 ENCOUNTER — Ambulatory Visit (HOSPITAL_COMMUNITY)
Admission: EM | Admit: 2017-10-26 | Discharge: 2017-10-26 | Disposition: A | Discharge status: Home / Self Care | Payer: Medicare Other | Attending: Emergency Medicine | Admitting: Emergency Medicine

## 2017-10-26 DIAGNOSIS — Z79899 Other long term (current) drug therapy: Secondary | ICD-10-CM | POA: Insufficient documentation

## 2017-10-26 DIAGNOSIS — Z87891 Personal history of nicotine dependence: Secondary | ICD-10-CM | POA: Diagnosis not present

## 2017-10-26 DIAGNOSIS — L03012 Cellulitis of left finger: Secondary | ICD-10-CM | POA: Diagnosis not present

## 2017-10-26 DIAGNOSIS — M79645 Pain in left finger(s): Secondary | ICD-10-CM | POA: Diagnosis present

## 2017-10-26 MED ORDER — CEPHALEXIN 500 MG PO CAPS: 500.0000 mg | ORAL_CAPSULE | Freq: Four times a day (QID) | ORAL | 0 refills | Status: AC

## 2017-10-26 MED ORDER — IBUPROFEN 400 MG PO TABS: 400.0000 mg | ORAL_TABLET | Freq: Four times a day (QID) | ORAL | 0 refills | Status: DC | PRN

## 2017-10-26 MED ORDER — CHLORHEXIDINE GLUCONATE 4 % EX LIQD: Freq: Every day | CUTANEOUS | 0 refills | Status: DC | PRN

## 2017-10-26 NOTE — ED Provider Notes (Signed)
HPI  SUBJECTIVE:  Rodney Walker is a right handed 36 y.o. male who presents with left lateral thumb pain, erythema, swelling after pulling a hangnail off 1 week ago.  He describes the pain as constant, burning, stabbing, sore and throbbing.  He reports whitish discoloration at the base of the nail.  No body aches, fevers, red streaking proximally.  He tried warm soaks without improvement of symptoms.  Symptoms are worse with palpation.  Past medical history of HIV, CD4 count 620, viral load 177, coronary disease, smoking, asthma, MI.  No history of diabetes, hypertension.  PMD: Dr. Wyline Copas  Past Medical History:  Diagnosis Date  . Chest pain 2010  . Coronary artery disease    SPONTANEOUS DISSECTION AND PLAQUE RUPTURE OF HIS LEFT ANTERIOR DESCENDING ARTERY  . Eczema   . Emphysema lung (HCC)   . Empyema lung (HCC)   . HIV infection (HCC)   . Hyperlipidemia   . Immune deficiency disorder (HCC)   . MI (myocardial infarction) (HCC)   . Moderate persistent asthma without complication 10/29/2016    Past Surgical History:  Procedure Laterality Date  . CARDIAC CATHETERIZATION  06/24/2009   EF 60%  . CARDIAC CATHETERIZATION  06/17/2009   EF 45%. ANTERIOR HYPOKINESIS  . US ECHOCARDIOGRAPHY  12/09/2009   EF 55-60%  . VIDEO ASSISTED THORACOSCOPY (VATS)/ LOBECTOMY Right 04/23/2015   Procedure: VIDEO ASSISTED THORACOSCOPY (VATS)/RIGHT upper  and right middle LOBECTOMY;  Surgeon: Kerin Perna, MD;  Location: [facility];  Service: Thoracic;  Laterality: Right;  Marland Kitchen VIDEO BRONCHOSCOPY Bilateral 09/05/2014   Procedure: VIDEO BRONCHOSCOPY WITH FLUORO;  Surgeon: Merwyn Katos, MD;  Location: [facility] ENDOSCOPY;  Service: Cardiopulmonary;  Laterality: Bilateral;  . VIDEO BRONCHOSCOPY Bilateral 04/10/2015   Procedure: VIDEO BRONCHOSCOPY WITH FLUORO;  Surgeon: Lupita Leash, MD;  Location: [facility] ENDOSCOPY;  Service: Cardiopulmonary;  Laterality: Bilateral;    Family History  Problem Relation Age of Onset   . Diabetes Mother   . Eczema Mother   . Asthma Mother   . Liver cancer Father   . Asthma Brother     Social History  Substance Use Topics  . Smoking status: Former Smoker    Packs/day: 0.25    Years: 17.00    Types: Cigarettes    Quit date: 03/23/2015  . Smokeless tobacco: Never Used  . Alcohol use No    No current facility-administered medications for this encounter.   Current Outpatient Prescriptions:  .  albuterol (PROVENTIL HFA;VENTOLIN HFA) 108 (90 Base) MCG/ACT inhaler, Inhale 4 puffs into the lungs every 4 (four) hours as needed for wheezing or shortness of breath. (Patient not taking: Reported on 09/29/2017), Disp: 1 each, Rfl: 2 .  beclomethasone (BECONASE AQ) 42 MCG/SPRAY nasal spray, Place 2 sprays into both nostrils daily. Dose is for each nostril. (Patient not taking: Reported on 09/29/2017), Disp: 25 g, Rfl: 12 .  beclomethasone (QVAR) 80 MCG/ACT inhaler, Inhale 2 puffs into the lungs 2 (two) times daily. (Patient not taking: Reported on 09/29/2017), Disp: 1 Inhaler, Rfl: 12 .  BIKTARVY 50-200-25 MG TABS tablet, TAKE 1 TABLET BY MOUTH ONCE DAILY WITH BREAKFAST, Disp: 30 tablet, Rfl: 5 .  cephALEXin (KEFLEX) 500 MG capsule, Take 1 capsule (500 mg total) by mouth 4 (four) times daily. X 5 days, Disp: 20 capsule, Rfl: 0 .  chlorhexidine (HIBICLENS) 4 % external liquid, Apply topically daily as needed. Dilute 10-15 mL in water, soak finger in this, Disp: 120 mL, Rfl: 0 .  EPINEPHrine  0.3 mg/0.3 mL IJ SOAJ injection, Inject 0.3 mLs (0.3 mg total) into the muscle once., Disp: 0.3 mL, Rfl: 0 .  ibuprofen (ADVIL,MOTRIN) 400 MG tablet, Take 1 tablet (400 mg total) by mouth every 6 (six) hours as needed., Disp: 30 tablet, Rfl: 0 .  PREZCOBIX 800-150 MG tablet, TAKE 1 TABLET BY MOUTH ONCE DAILY WITH FOOD, Disp: 30 tablet, Rfl: 5 .  triamcinolone ointment (KENALOG) 0.1 %, Apply 1 application topically 2 (two) times daily. (Patient not taking: Reported on 09/29/2017), Disp: 30 g, Rfl:  1  No Known Allergies   ROS  As noted in HPI.   Physical Exam  BP 128/76 (BP Location: Left Arm)   Pulse 90   Temp 98.5 F (36.9 C) (Oral)   Resp 16   Ht 5\' 4"  (1.626 m)   Wt 135 lb (61.2 kg)   SpO2 97%   BMI 23.17 kg/m   Constitutional: Well developed, well nourished, no acute distress Eyes:  EOMI, conjunctiva normal bilaterally HENT: Normocephalic, atraumatic,mucus membranes moist Respiratory: Normal inspiratory effort Cardiovascular: Normal rate GI: nondistended skin: No rash, skin intact Musculoskeletal: Paronychia lateral left nailbed with the tender erythema, edema.  No evidence of felon.  See picture.     Neurologic: Alert & oriented x 3, no focal neuro deficits Psychiatric: Speech and behavior appropriate   ED Course   Medications - No data to display  Orders Placed This Encounter  Procedures  . Wound or Superficial Culture    Standing Status:   Standing    Number of Occurrences:   1    Order Specific Question:   Patient immune status    Answer:   Immunocompromised    No results found for this or any previous visit (from the past 24 hour(s)). No results found.  ED Clinical Impression  Paronychia of left thumb   ED Assessment/Plan  Procedure note.  Had patient scrub the hand extensively with tap water and chlorhexidine.  Then cleaned base of thumb with alcohol and used 0.5 cc of 2% plain lidocaine for digital block with adequate anesthesia.  Then made a single stab incision with an 11 pain and expressed a moderate amount of pus.  Cultures sent.  Bacitracin, dressing applied.  Patient tolerated procedure well.  We will send home with Keflex 500 mg 4 times daily for 5 days.  400 mg of ibuprofen with 1 g of Tylenol 3-4 times a day as needed for pain.  Change dressing every day.  Follow-up with PMD in 3 days if not getting significantly better, to the ER if he gets worse.  Discussed that MDM, plan and followup with patient. Discussed sn/sx that  should prompt return to the ED. patient agrees with plan.   Meds ordered this encounter  Medications  . chlorhexidine (HIBICLENS) 4 % external liquid    Sig: Apply topically daily as needed. Dilute 10-15 mL in water, soak finger in this    Dispense:  120 mL    Refill:  0  . cephALEXin (KEFLEX) 500 MG capsule    Sig: Take 1 capsule (500 mg total) by mouth 4 (four) times daily. X 5 days    Dispense:  20 capsule    Refill:  0  . ibuprofen (ADVIL,MOTRIN) 400 MG tablet    Sig: Take 1 tablet (400 mg total) by mouth every 6 (six) hours as needed.    Dispense:  30 tablet    Refill:  0    *This clinic note was created  using Scientist, clinical (histocompatibility and immunogenetics)Dragon dictation software. Therefore, there may be occasional mistakes despite careful proofreading.   ?    Domenick GongMortenson, Nelia Rogoff, MD 10/26/17 [phone]

## 2017-10-26 NOTE — ED Triage Notes (Signed)
Pt here for pain as swelling to left thumb around nail bed

## 2017-10-26 NOTE — Discharge Instructions (Signed)
Finish all the Keflex unless your doctor tells you to stop.  400 mg of ibuprofen with 1 g of Tylenol 3-4 times a day as needed for pain.  Soak your finger in the diluted chlorhexidine/warm water mix once daily.  Then apply bacitracin and a dressing.

## 2017-10-29 LAB — AEROBIC CULTURE  (SUPERFICIAL SPECIMEN)

## 2017-10-29 LAB — AEROBIC CULTURE W GRAM STAIN (SUPERFICIAL SPECIMEN)

## 2017-11-02 ENCOUNTER — Encounter: Payer: Self-pay | Admitting: Internal Medicine

## 2017-11-02 ENCOUNTER — Ambulatory Visit (INDEPENDENT_AMBULATORY_CARE_PROVIDER_SITE_OTHER): Payer: Medicare Other | Admitting: Internal Medicine

## 2017-11-02 VITALS — BP 115/66 | HR 90 | Temp 97.7°F | Wt 136.0 lb

## 2017-11-02 DIAGNOSIS — L03012 Cellulitis of left finger: Secondary | ICD-10-CM | POA: Diagnosis not present

## 2017-11-02 DIAGNOSIS — L308 Other specified dermatitis: Secondary | ICD-10-CM

## 2017-11-02 DIAGNOSIS — J302 Other seasonal allergic rhinitis: Secondary | ICD-10-CM | POA: Diagnosis not present

## 2017-11-02 DIAGNOSIS — B20 Human immunodeficiency virus [HIV] disease: Secondary | ICD-10-CM

## 2017-11-02 MED ORDER — CETIRIZINE HCL 10 MG PO TABS: 10.0000 mg | ORAL_TABLET | Freq: Every day | ORAL | 1 refills | Status: DC

## 2017-11-02 MED ORDER — FLUNISOLIDE 25 MCG/ACT (0.025%) NA SOLN: 2.0000 | Freq: Every day | NASAL | 0 refills | Status: DC

## 2017-11-02 MED ORDER — DOXYCYCLINE HYCLATE 100 MG PO TABS: 100.0000 mg | ORAL_TABLET | Freq: Two times a day (BID) | ORAL | 0 refills | Status: DC

## 2017-11-02 MED ORDER — BETAMETHASONE DIPROPIONATE 0.05 % EX CREA: TOPICAL_CREAM | Freq: Two times a day (BID) | CUTANEOUS | 1 refills | Status: DC

## 2017-11-02 NOTE — Progress Notes (Signed)
RFV: follow up for hiv disease  Patient ID: Rodney Walker, male   DOB: Mar 05, 1981, 36 y.o.   MRN: [id number]  HPI Rodney Walker is a 36yo M with well controlled hiv disease, Cd 4 count of 620/VL 55 currently on biktarvy/prezcobix In the past had seen an allergist for his allergies, did recommend allergy shots at that time. Consider referral back to dr gallagher  Outpatient Encounter Medications as of 11/02/2017  Medication Sig  . albuterol (PROVENTIL HFA;VENTOLIN HFA) 108 (90 Base) MCG/ACT inhaler Inhale 4 puffs into the lungs every 4 (four) hours as needed for wheezing or shortness of breath.  . beclomethasone (BECONASE AQ) 42 MCG/SPRAY nasal spray Place 2 sprays into both nostrils daily. Dose is for each nostril.  . beclomethasone (QVAR) 80 MCG/ACT inhaler Inhale 2 puffs into the lungs 2 (two) times daily.  Marland Kitchen BIKTARVY 50-200-25 MG TABS tablet TAKE 1 TABLET BY MOUTH ONCE DAILY WITH BREAKFAST  . chlorhexidine (HIBICLENS) 4 % external liquid Apply topically daily as needed. Dilute 10-15 mL in water, soak finger in this  . ibuprofen (ADVIL,MOTRIN) 400 MG tablet Take 1 tablet (400 mg total) by mouth every 6 (six) hours as needed.  Marland Kitchen PREZCOBIX 800-150 MG tablet TAKE 1 TABLET BY MOUTH ONCE DAILY WITH FOOD  . triamcinolone ointment (KENALOG) 0.1 % Apply 1 application topically 2 (two) times daily.  Marland Kitchen EPINEPHrine 0.3 mg/0.3 mL IJ SOAJ injection Inject 0.3 mLs (0.3 mg total) into the muscle once.   No facility-administered encounter medications on file as of 11/02/2017.      Patient Active Problem List   Diagnosis Date Noted  . Marijuana abuse, continuous 10/29/2016  . Moderate persistent asthma without complication 10/29/2016  . Allergic rhinitis 10/08/2016  . Cough 02/20/2016  . Anemia 05/03/2015  . Lung abscess (HCC) 04/22/2015  . Sepsis due to pneumonia (HCC) 04/17/2015  . Sepsis (HCC) 04/17/2015  . Bullous emphysema (HCC)   . Lobar pneumonia (HCC)   . Hyperkalemia 04/16/2015   . Empyema lung (HCC) 04/16/2015  . PNA (pneumonia) 04/16/2015  . HIV (human immunodeficiency virus infection) (HCC)   . Tobacco abuse 03/21/2015  . Lung mass 08/31/2014  . 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 Myalgia 04/23/2014  . Pharyngitis 03/31/2014  . Hyponatremia 03/31/2014  . Fever 03/30/2014  . AIDS (HCC) 03/22/2014  . Thrush 03/22/2014  . CAP (community acquired pneumonia) 03/22/2014  . CAD (coronary artery disease) 11/13/2011     There are no preventive care reminders to display for this patient.   Review of Systems + nasal congestion,+ dry skin 12 point ros is otherwise negative Physical Exam   BP 115/66   Pulse 90   Temp 97.7 F (36.5 C) (Oral)   Wt 136 lb (61.7 kg)   BMI 23.34 kg/m   Physical Exam  Constitutional: He is oriented to person, place, and time. He appears well-developed and well-nourished. No distress.  HENT:  Mouth/Throat: Oropharynx is clear and moist. No oropharyngeal exudate.  Cardiovascular: Normal rate, regular rhythm and normal heart sounds. Exam reveals no gallop and no friction rub.  No murmur heard.  Pulmonary/Chest: Effort normal and breath sounds normal. No respiratory distress. He has no wheezes.  Lymphadenopathy:  He has no cervical adenopathy.  Neurological: He is alert and oriented to person, place, and time.  Skin: Skin is warm and dry. No rash noted. No erythema. + mildly tender at affected first digit from recent parynochia Psychiatric: He has a normal mood and affect. His behavior is normal.  Lab Results  Component Value Date   CD4TCELL 21 (L) 05/31/2017   Lab Results  Component Value Date   CD4TABS 620 05/31/2017   CD4TABS 490 01/28/2017   CD4TABS 500 10/28/2016   Lab Results  Component Value Date   HIV1RNAQUANT 55 (H) 09/29/2017   No results found for: HEPBSAB Lab Results  Component Value Date   LABRPR NON REAC 02/12/2017    CBC Lab Results  Component Value Date   WBC 10.8 (H) 09/25/2017   RBC  4.90 09/25/2017   HGB 15.6 09/25/2017   HCT 42.7 09/25/2017   PLT 154 09/25/2017   MCV 87.1 09/25/2017   MCH 31.8 09/25/2017   MCHC 36.5 (H) 09/25/2017   RDW 13.7 09/25/2017   LYMPHSABS 2.5 09/25/2017   MONOABS 1.1 (H) 09/25/2017   EOSABS 0.0 09/25/2017    BMET Lab Results  Component Value Date   NA 135 09/25/2017   K 3.2 (L) 09/25/2017   CL 103 09/25/2017   CO2 23 09/25/2017   GLUCOSE 103 (H) 09/25/2017   BUN 11 09/25/2017   CREATININE 1.15 09/25/2017   CALCIUM 9.0 09/25/2017   GFRNONAA >60 09/25/2017   GFRAA >60 09/25/2017      Assessment and Plan   hiv disease = continue on biktarvy-prezcobix  Eczema = will do a trial of beclomethasone 0.05% cream   parynochia = will give doxy x 7d  Allergic rhinitis = will try flunisolide plus zyrtec. And referral back to dr gallagher

## 2017-11-03 ENCOUNTER — Other Ambulatory Visit: Payer: Self-pay | Admitting: Allergy & Immunology

## 2017-11-03 ENCOUNTER — Other Ambulatory Visit: Payer: Self-pay | Admitting: Pharmacist

## 2017-11-03 MED ORDER — DARUNAVIR-COBICISTAT 800-150 MG PO TABS: 1.0000 | ORAL_TABLET | Freq: Every day | ORAL | 5 refills | Status: DC

## 2017-11-03 MED ORDER — BICTEGRAVIR-EMTRICITAB-TENOFOV 50-200-25 MG PO TABS: 1.0000 | ORAL_TABLET | Freq: Every day | ORAL | 5 refills | Status: DC

## 2017-12-16 ENCOUNTER — Other Ambulatory Visit: Payer: Medicare Other

## 2017-12-24 ENCOUNTER — Other Ambulatory Visit: Payer: Medicare Other

## 2017-12-24 DIAGNOSIS — B20 Human immunodeficiency virus [HIV] disease: Secondary | ICD-10-CM

## 2017-12-27 LAB — HIV-1 RNA QUANT-NO REFLEX-BLD
HIV 1 RNA QUANT: 31 {copies}/mL — AB
HIV-1 RNA QUANT, LOG: 1.49 {Log_copies}/mL — AB

## 2018-01-06 ENCOUNTER — Ambulatory Visit: Payer: Medicare Other | Admitting: Internal Medicine

## 2018-03-24 ENCOUNTER — Other Ambulatory Visit: Payer: Self-pay

## 2018-03-24 ENCOUNTER — Encounter (HOSPITAL_COMMUNITY): Payer: Self-pay | Admitting: Emergency Medicine

## 2018-03-24 ENCOUNTER — Emergency Department (HOSPITAL_COMMUNITY): Payer: Medicare Other

## 2018-03-24 ENCOUNTER — Inpatient Hospital Stay (HOSPITAL_COMMUNITY)
Admission: EM | Admit: 2018-03-24 | Discharge: 2018-03-26 | DRG: 247 | Disposition: A | Discharge status: Home / Self Care | Payer: Medicare Other | Attending: Cardiovascular Disease | Admitting: Cardiovascular Disease

## 2018-03-24 ENCOUNTER — Inpatient Hospital Stay (HOSPITAL_COMMUNITY)
Admission: EM | Disposition: A | Discharge status: Home / Self Care | Payer: Self-pay | Source: Home / Self Care | Attending: Cardiovascular Disease

## 2018-03-24 DIAGNOSIS — D7218 Eosinophilia in diseases classified elsewhere: Secondary | ICD-10-CM | POA: Diagnosis present

## 2018-03-24 DIAGNOSIS — F1210 Cannabis abuse, uncomplicated: Secondary | ICD-10-CM | POA: Diagnosis present

## 2018-03-24 DIAGNOSIS — L738 Other specified follicular disorders: Secondary | ICD-10-CM | POA: Diagnosis present

## 2018-03-24 DIAGNOSIS — J439 Emphysema, unspecified: Secondary | ICD-10-CM | POA: Diagnosis present

## 2018-03-24 DIAGNOSIS — I2111 ST elevation (STEMI) myocardial infarction involving right coronary artery: Principal | ICD-10-CM

## 2018-03-24 DIAGNOSIS — Z560 Unemployment, unspecified: Secondary | ICD-10-CM | POA: Diagnosis not present

## 2018-03-24 DIAGNOSIS — J4540 Moderate persistent asthma, uncomplicated: Secondary | ICD-10-CM | POA: Diagnosis not present

## 2018-03-24 DIAGNOSIS — E7800 Pure hypercholesterolemia, unspecified: Secondary | ICD-10-CM | POA: Diagnosis not present

## 2018-03-24 DIAGNOSIS — I959 Hypotension, unspecified: Secondary | ICD-10-CM | POA: Diagnosis present

## 2018-03-24 DIAGNOSIS — Z87891 Personal history of nicotine dependence: Secondary | ICD-10-CM | POA: Diagnosis not present

## 2018-03-24 DIAGNOSIS — I2510 Atherosclerotic heart disease of native coronary artery without angina pectoris: Secondary | ICD-10-CM | POA: Diagnosis not present

## 2018-03-24 DIAGNOSIS — E785 Hyperlipidemia, unspecified: Secondary | ICD-10-CM | POA: Diagnosis not present

## 2018-03-24 DIAGNOSIS — B20 Human immunodeficiency virus [HIV] disease: Secondary | ICD-10-CM | POA: Diagnosis present

## 2018-03-24 DIAGNOSIS — E876 Hypokalemia: Secondary | ICD-10-CM | POA: Diagnosis not present

## 2018-03-24 DIAGNOSIS — R079 Chest pain, unspecified: Secondary | ICD-10-CM | POA: Diagnosis not present

## 2018-03-24 DIAGNOSIS — I252 Old myocardial infarction: Secondary | ICD-10-CM | POA: Diagnosis present

## 2018-03-24 DIAGNOSIS — E782 Mixed hyperlipidemia: Secondary | ICD-10-CM | POA: Diagnosis not present

## 2018-03-24 DIAGNOSIS — Z955 Presence of coronary angioplasty implant and graft: Secondary | ICD-10-CM

## 2018-03-24 DIAGNOSIS — Z79899 Other long term (current) drug therapy: Secondary | ICD-10-CM

## 2018-03-24 DIAGNOSIS — R112 Nausea with vomiting, unspecified: Secondary | ICD-10-CM | POA: Diagnosis not present

## 2018-03-24 DIAGNOSIS — L731 Pseudofolliculitis barbae: Secondary | ICD-10-CM

## 2018-03-24 DIAGNOSIS — Z720 Tobacco use: Secondary | ICD-10-CM | POA: Diagnosis present

## 2018-03-24 DIAGNOSIS — Z21 Asymptomatic human immunodeficiency virus [HIV] infection status: Secondary | ICD-10-CM | POA: Diagnosis present

## 2018-03-24 HISTORY — PX: CORONARY/GRAFT ACUTE MI REVASCULARIZATION: CATH118305

## 2018-03-24 HISTORY — PX: LEFT HEART CATH AND CORONARY ANGIOGRAPHY: CATH118249

## 2018-03-24 HISTORY — PX: CORONARY STENT INTERVENTION: CATH118234

## 2018-03-24 LAB — COMPREHENSIVE METABOLIC PANEL
ALT: 10 U/L — ABNORMAL LOW (ref 17–63)
ANION GAP: 13 (ref 5–15)
AST: 24 U/L (ref 15–41)
Albumin: 3.7 g/dL (ref 3.5–5.0)
Alkaline Phosphatase: 79 U/L (ref 38–126)
BUN: 14 mg/dL (ref 6–20)
CHLORIDE: 103 mmol/L (ref 101–111)
CO2: 20 mmol/L — ABNORMAL LOW (ref 22–32)
CREATININE: 1.34 mg/dL — AB (ref 0.61–1.24)
Calcium: 9.1 mg/dL (ref 8.9–10.3)
Glucose, Bld: 170 mg/dL — ABNORMAL HIGH (ref 65–99)
POTASSIUM: 3.1 mmol/L — AB (ref 3.5–5.1)
Sodium: 136 mmol/L (ref 135–145)
Total Bilirubin: 0.6 mg/dL (ref 0.3–1.2)
Total Protein: 7.4 g/dL (ref 6.5–8.1)

## 2018-03-24 LAB — I-STAT TROPONIN, ED: Troponin i, poc: 0.1 ng/mL (ref 0.00–0.08)

## 2018-03-24 LAB — CBC WITH DIFFERENTIAL/PLATELET
BASOS ABS: 0 10*3/uL (ref 0.0–0.1)
BASOS PCT: 0 %
EOS PCT: 0 %
Eosinophils Absolute: 0 10*3/uL (ref 0.0–0.7)
HCT: 38.7 % — ABNORMAL LOW (ref 39.0–52.0)
Hemoglobin: 14.1 g/dL (ref 13.0–17.0)
Lymphocytes Relative: 24 %
Lymphs Abs: 3.6 10*3/uL (ref 0.7–4.0)
MCH: 32.2 pg (ref 26.0–34.0)
MCHC: 36.4 g/dL — ABNORMAL HIGH (ref 30.0–36.0)
MCV: 88.4 fL (ref 78.0–100.0)
Monocytes Absolute: 1.2 10*3/uL — ABNORMAL HIGH (ref 0.1–1.0)
Monocytes Relative: 8 %
Neutro Abs: 10 10*3/uL — ABNORMAL HIGH (ref 1.7–7.7)
Neutrophils Relative %: 68 %
PLATELETS: 262 10*3/uL (ref 150–400)
RBC: 4.38 MIL/uL (ref 4.22–5.81)
RDW: 13.7 % (ref 11.5–15.5)
WBC: 14.8 10*3/uL — AB (ref 4.0–10.5)

## 2018-03-24 LAB — CBC
HCT: 33.4 % — ABNORMAL LOW (ref 39.0–52.0)
HEMOGLOBIN: 12.1 g/dL — AB (ref 13.0–17.0)
MCH: 32 pg (ref 26.0–34.0)
MCHC: 36.2 g/dL — ABNORMAL HIGH (ref 30.0–36.0)
MCV: 88.4 fL (ref 78.0–100.0)
Platelets: 203 10*3/uL (ref 150–400)
RBC: 3.78 MIL/uL — AB (ref 4.22–5.81)
RDW: 14.1 % (ref 11.5–15.5)
WBC: 8.1 10*3/uL (ref 4.0–10.5)

## 2018-03-24 LAB — LIPID PANEL
CHOL/HDL RATIO: 3.9 ratio
CHOLESTEROL: 176 mg/dL (ref 0–200)
HDL: 45 mg/dL (ref >40)
LDL Cholesterol: 106 mg/dL — ABNORMAL HIGH (ref 0–99)
Triglycerides: 123 mg/dL (ref <150)
VLDL: 25 mg/dL (ref 0–40)

## 2018-03-24 LAB — CREATININE, SERUM: CREATININE: 1.19 mg/dL (ref 0.61–1.24)

## 2018-03-24 LAB — PROTIME-INR
INR: 1.07
PROTHROMBIN TIME: 13.8 s (ref 11.4–15.2)

## 2018-03-24 LAB — POCT ACTIVATED CLOTTING TIME: Activated Clotting Time: 241 seconds

## 2018-03-24 LAB — HEMOGLOBIN A1C
Hgb A1c MFr Bld: 5 % (ref 4.8–5.6)
MEAN PLASMA GLUCOSE: 96.8 mg/dL

## 2018-03-24 LAB — BRAIN NATRIURETIC PEPTIDE: B Natriuretic Peptide: 36.3 pg/mL (ref 0.0–100.0)

## 2018-03-24 LAB — TROPONIN I
TROPONIN I: 0.06 ng/mL — AB (ref <0.03)
Troponin I: 5.58 ng/mL (ref <0.03)

## 2018-03-24 LAB — MRSA PCR SCREENING: MRSA by PCR: NEGATIVE

## 2018-03-24 LAB — TSH: TSH: 0.297 u[IU]/mL — ABNORMAL LOW (ref 0.350–4.500)

## 2018-03-24 LAB — PLATELET COUNT: Platelets: 217 10*3/uL (ref 150–400)

## 2018-03-24 LAB — APTT: APTT: 27 s (ref 24–36)

## 2018-03-24 SURGERY — LEFT HEART CATH AND CORONARY ANGIOGRAPHY
Anesthesia: LOCAL

## 2018-03-24 MED ORDER — TIROFIBAN HCL IN NACL 5-0.9 MG/100ML-% IV SOLN
INTRAVENOUS | Status: AC
  Filled 2018-03-24: qty 100

## 2018-03-24 MED ORDER — LIDOCAINE HCL (PF) 1 % IJ SOLN
INTRAMUSCULAR | Status: DC | PRN
  Administered 2018-03-24: 2 mL

## 2018-03-24 MED ORDER — VERAPAMIL HCL 2.5 MG/ML IV SOLN
INTRAVENOUS | Status: AC
  Filled 2018-03-24: qty 2

## 2018-03-24 MED ORDER — IOPAMIDOL (ISOVUE-370) INJECTION 76%
INTRAVENOUS | Status: DC | PRN
  Administered 2018-03-24: 110 mL

## 2018-03-24 MED ORDER — NITROGLYCERIN 1 MG/10 ML FOR IR/CATH LAB
INTRA_ARTERIAL | Status: AC
  Filled 2018-03-24: qty 10

## 2018-03-24 MED ORDER — ASPIRIN 81 MG PO CHEW
324.0000 mg | CHEWABLE_TABLET | Freq: Once | ORAL | Status: AC
  Administered 2018-03-24: 324 mg via ORAL
  Filled 2018-03-24: qty 4

## 2018-03-24 MED ORDER — SODIUM CHLORIDE 0.9% FLUSH: 3.0000 mL | INTRAVENOUS | Status: DC | PRN

## 2018-03-24 MED ORDER — SODIUM CHLORIDE 0.9 % WEIGHT BASED INFUSION: 1.0000 mL/kg/h | INTRAVENOUS | Status: AC

## 2018-03-24 MED ORDER — VERAPAMIL HCL 2.5 MG/ML IV SOLN
INTRAVENOUS | Status: DC | PRN
  Administered 2018-03-24: 10 mL via INTRA_ARTERIAL

## 2018-03-24 MED ORDER — HEPARIN (PORCINE) IN NACL 2-0.9 UNIT/ML-% IJ SOLN
INTRAMUSCULAR | Status: AC | PRN
  Administered 2018-03-24 (×2): 500 mL

## 2018-03-24 MED ORDER — LIDOCAINE HCL (PF) 1 % IJ SOLN
INTRAMUSCULAR | Status: AC
  Filled 2018-03-24: qty 30

## 2018-03-24 MED ORDER — PRASUGREL HCL 10 MG PO TABS
10.0000 mg | ORAL_TABLET | Freq: Every day | ORAL | Status: DC
  Administered 2018-03-25 – 2018-03-26 (×2): 10 mg via ORAL
  Filled 2018-03-24 (×2): qty 1

## 2018-03-24 MED ORDER — HEPARIN SODIUM (PORCINE) 1000 UNIT/ML IJ SOLN
INTRAMUSCULAR | Status: AC
  Filled 2018-03-24: qty 1

## 2018-03-24 MED ORDER — BICTEGRAVIR-EMTRICITAB-TENOFOV 50-200-25 MG PO TABS: 1.0000 | ORAL_TABLET | Freq: Every day | ORAL | Status: DC

## 2018-03-24 MED ORDER — TIROFIBAN HCL IN NACL 5-0.9 MG/100ML-% IV SOLN
INTRAVENOUS | Status: AC | PRN
  Administered 2018-03-24: 0.15 ug/kg/min via INTRAVENOUS

## 2018-03-24 MED ORDER — LORATADINE 10 MG PO TABS
10.0000 mg | ORAL_TABLET | Freq: Every day | ORAL | Status: DC
  Administered 2018-03-25 – 2018-03-26 (×2): 10 mg via ORAL
  Filled 2018-03-24 (×2): qty 1

## 2018-03-24 MED ORDER — MIDAZOLAM HCL 2 MG/2ML IJ SOLN
INTRAMUSCULAR | Status: DC | PRN
  Administered 2018-03-24: 1 mg via INTRAVENOUS

## 2018-03-24 MED ORDER — ONDANSETRON HCL 4 MG/2ML IJ SOLN
4.0000 mg | Freq: Once | INTRAMUSCULAR | Status: AC
  Administered 2018-03-24: 4 mg via INTRAVENOUS

## 2018-03-24 MED ORDER — SODIUM CHLORIDE 0.9 % IV SOLN: 250.0000 mL | INTRAVENOUS | Status: DC | PRN

## 2018-03-24 MED ORDER — FLUNISOLIDE 25 MCG/ACT (0.025%) NA SOLN
2.0000 | Freq: Every day | NASAL | Status: DC
  Filled 2018-03-24: qty 25

## 2018-03-24 MED ORDER — HEPARIN SODIUM (PORCINE) 5000 UNIT/ML IJ SOLN
60.0000 [IU]/kg | Freq: Once | INTRAMUSCULAR | Status: AC
  Administered 2018-03-24: 3650 [IU] via INTRAVENOUS

## 2018-03-24 MED ORDER — TIROFIBAN (AGGRASTAT) BOLUS VIA INFUSION
INTRAVENOUS | Status: DC | PRN
  Administered 2018-03-24: 1530 ug via INTRAVENOUS

## 2018-03-24 MED ORDER — ALBUTEROL SULFATE HFA 108 (90 BASE) MCG/ACT IN AERS: 4.0000 | INHALATION_SPRAY | RESPIRATORY_TRACT | Status: DC | PRN

## 2018-03-24 MED ORDER — ACETAMINOPHEN 325 MG PO TABS
650.0000 mg | ORAL_TABLET | ORAL | Status: DC | PRN
  Filled 2018-03-24: qty 2

## 2018-03-24 MED ORDER — NITROGLYCERIN 0.4 MG SL SUBL
0.4000 mg | SUBLINGUAL_TABLET | SUBLINGUAL | Status: DC | PRN
  Administered 2018-03-24: 0.4 mg via SUBLINGUAL
  Filled 2018-03-24: qty 1

## 2018-03-24 MED ORDER — TIROFIBAN HCL IN NACL 5-0.9 MG/100ML-% IV SOLN
0.1500 ug/kg/min | INTRAVENOUS | Status: AC
  Administered 2018-03-24: 0.15 ug/kg/min via INTRAVENOUS
  Filled 2018-03-24: qty 100

## 2018-03-24 MED ORDER — MORPHINE SULFATE (PF) 2 MG/ML IV SOLN: 2.0000 mg | INTRAVENOUS | Status: DC | PRN

## 2018-03-24 MED ORDER — NITROGLYCERIN 1 MG/10 ML FOR IR/CATH LAB
INTRA_ARTERIAL | Status: DC | PRN
  Administered 2018-03-24: 50 ug via INTRACORONARY

## 2018-03-24 MED ORDER — HEPARIN SODIUM (PORCINE) 1000 UNIT/ML IJ SOLN
INTRAMUSCULAR | Status: DC | PRN
  Administered 2018-03-24: 2000 [IU] via INTRAVENOUS

## 2018-03-24 MED ORDER — MIDAZOLAM HCL 2 MG/2ML IJ SOLN
INTRAMUSCULAR | Status: AC
  Filled 2018-03-24: qty 2

## 2018-03-24 MED ORDER — PRASUGREL HCL 10 MG PO TABS
30.0000 mg | ORAL_TABLET | Freq: Once | ORAL | Status: AC
  Administered 2018-03-24: 30 mg via ORAL
  Filled 2018-03-24: qty 3

## 2018-03-24 MED ORDER — SODIUM CHLORIDE 0.9 % IV SOLN
INTRAVENOUS | Status: DC
  Administered 2018-03-24: 500 mL via INTRAVENOUS

## 2018-03-24 MED ORDER — HEPARIN SODIUM (PORCINE) 5000 UNIT/ML IJ SOLN
5000.0000 [IU] | Freq: Three times a day (TID) | INTRAMUSCULAR | Status: DC
  Administered 2018-03-25 – 2018-03-26 (×4): 5000 [IU] via SUBCUTANEOUS
  Filled 2018-03-24 (×4): qty 1

## 2018-03-24 MED ORDER — ALBUTEROL SULFATE (2.5 MG/3ML) 0.083% IN NEBU
2.5000 mg | INHALATION_SOLUTION | RESPIRATORY_TRACT | Status: DC | PRN
Start: 2018-03-24 — End: 2018-03-26

## 2018-03-24 MED ORDER — BICTEGRAVIR-EMTRICITAB-TENOFOV 50-200-25 MG PO TABS
1.0000 | ORAL_TABLET | Freq: Every day | ORAL | Status: DC
  Administered 2018-03-24 – 2018-03-26 (×3): 1 via ORAL
  Filled 2018-03-24 (×3): qty 1

## 2018-03-24 MED ORDER — TICAGRELOR 90 MG PO TABS: 90.0000 mg | ORAL_TABLET | Freq: Two times a day (BID) | ORAL | Status: DC

## 2018-03-24 MED ORDER — TICAGRELOR 90 MG PO TABS
ORAL_TABLET | ORAL | Status: DC | PRN
  Administered 2018-03-24: 180 mg via ORAL

## 2018-03-24 MED ORDER — TICAGRELOR 90 MG PO TABS
ORAL_TABLET | ORAL | Status: AC
  Filled 2018-03-24: qty 2

## 2018-03-24 MED ORDER — DOXYCYCLINE HYCLATE 100 MG PO TABS: 100.0000 mg | ORAL_TABLET | Freq: Two times a day (BID) | ORAL | Status: DC

## 2018-03-24 MED ORDER — DARUNAVIR-COBICISTAT 800-150 MG PO TABS
1.0000 | ORAL_TABLET | Freq: Every day | ORAL | Status: DC
  Filled 2018-03-24: qty 1

## 2018-03-24 MED ORDER — HEPARIN (PORCINE) IN NACL 2-0.9 UNIT/ML-% IJ SOLN
INTRAMUSCULAR | Status: AC
  Filled 2018-03-24: qty 1000

## 2018-03-24 MED ORDER — ATORVASTATIN CALCIUM 80 MG PO TABS: 80.0000 mg | ORAL_TABLET | Freq: Every day | ORAL | Status: DC

## 2018-03-24 MED ORDER — METOPROLOL TARTRATE 12.5 MG HALF TABLET
12.5000 mg | ORAL_TABLET | Freq: Two times a day (BID) | ORAL | Status: DC
  Administered 2018-03-24 – 2018-03-26 (×4): 12.5 mg via ORAL
  Filled 2018-03-24 (×4): qty 1

## 2018-03-24 MED ORDER — ASPIRIN EC 81 MG PO TBEC
81.0000 mg | DELAYED_RELEASE_TABLET | Freq: Every day | ORAL | Status: DC
  Administered 2018-03-25 – 2018-03-26 (×2): 81 mg via ORAL
  Filled 2018-03-24 (×2): qty 1

## 2018-03-24 MED ORDER — SODIUM CHLORIDE 0.9% FLUSH
3.0000 mL | Freq: Two times a day (BID) | INTRAVENOUS | Status: DC
  Administered 2018-03-25 – 2018-03-26 (×3): 3 mL via INTRAVENOUS

## 2018-03-24 MED ORDER — TRIAMCINOLONE ACETONIDE 0.5 % EX CREA: TOPICAL_CREAM | Freq: Two times a day (BID) | CUTANEOUS | Status: DC

## 2018-03-24 MED ORDER — BIVALIRUDIN BOLUS VIA INFUSION - CUPID: INTRAVENOUS | Status: DC | PRN

## 2018-03-24 MED ORDER — OXYCODONE HCL 5 MG PO TABS
5.0000 mg | ORAL_TABLET | ORAL | Status: DC | PRN
  Administered 2018-03-24: 10 mg via ORAL
  Filled 2018-03-24: qty 2

## 2018-03-24 MED ORDER — DARUNAVIR-COBICISTAT 800-150 MG PO TABS
1.0000 | ORAL_TABLET | Freq: Every day | ORAL | Status: DC
  Administered 2018-03-24 – 2018-03-26 (×3): 1 via ORAL
  Filled 2018-03-24 (×2): qty 1

## 2018-03-24 MED ORDER — ONDANSETRON HCL 4 MG/2ML IJ SOLN
4.0000 mg | Freq: Four times a day (QID) | INTRAMUSCULAR | Status: DC | PRN
Start: 2018-03-24 — End: 2018-03-26

## 2018-03-24 MED ORDER — ONDANSETRON HCL 4 MG/2ML IJ SOLN
INTRAMUSCULAR | Status: AC
  Filled 2018-03-24: qty 2

## 2018-03-24 MED ORDER — LABETALOL HCL 5 MG/ML IV SOLN: 10.0000 mg | INTRAVENOUS | Status: AC | PRN

## 2018-03-24 MED ORDER — ATORVASTATIN CALCIUM 20 MG PO TABS
20.0000 mg | ORAL_TABLET | Freq: Every day | ORAL | Status: DC
  Administered 2018-03-25: 20 mg via ORAL
  Filled 2018-03-24: qty 1

## 2018-03-24 MED ORDER — IOPAMIDOL (ISOVUE-370) INJECTION 76%
INTRAVENOUS | Status: AC
  Filled 2018-03-24: qty 125

## 2018-03-24 MED ORDER — HYDRALAZINE HCL 20 MG/ML IJ SOLN: 5.0000 mg | INTRAMUSCULAR | Status: AC | PRN

## 2018-03-24 MED ORDER — HEPARIN SODIUM (PORCINE) 5000 UNIT/ML IJ SOLN
INTRAMUSCULAR | Status: AC
  Filled 2018-03-24: qty 1

## 2018-03-24 SURGICAL SUPPLY — 20 items
BALLN SAPPHIRE 2.5X15 (BALLOONS) ×2
BALLN ~~LOC~~ EUPHORA RX 4.5X15 (BALLOONS) ×2
BALLOON SAPPHIRE 2.5X15 (BALLOONS) ×1 IMPLANT
BALLOON ~~LOC~~ EUPHORA RX 4.5X15 (BALLOONS) ×1 IMPLANT
BAND ZEPHYR COMPRESS 30 LONG (HEMOSTASIS) ×2 IMPLANT
CATH IMPULSE 5F ANG/FL3.5 (CATHETERS) ×2 IMPLANT
CATH PRIORITY ONE AC 6F (CATHETERS) ×2 IMPLANT
CATH VISTA GUIDE 6FR JR4 (CATHETERS) ×2 IMPLANT
GUIDEWIRE INQWIRE 1.5J.035X260 (WIRE) ×1 IMPLANT
INQWIRE 1.5J .035X260CM (WIRE) ×2
KIT ENCORE 26 ADVANTAGE (KITS) ×2 IMPLANT
KIT HEART LEFT (KITS) ×2 IMPLANT
NEEDLE PERC 21GX4CM (NEEDLE) ×2 IMPLANT
PACK CARDIAC CATHETERIZATION (CUSTOM PROCEDURE TRAY) ×2 IMPLANT
SHEATH RAIN RADIAL 21G 6FR (SHEATH) ×2 IMPLANT
STENT SIERRA 4.00 X 23 MM (Permanent Stent) ×2 IMPLANT
SYR MEDRAD MARK V 150ML (SYRINGE) ×2 IMPLANT
TRANSDUCER W/STOPCOCK (MISCELLANEOUS) ×2 IMPLANT
TUBING CIL FLEX 10 FLL-RA (TUBING) ×2 IMPLANT
WIRE COUGAR XT STRL 190CM (WIRE) ×4 IMPLANT

## 2018-03-24 NOTE — ED Notes (Signed)
Cath lab pads placed on pt 

## 2018-03-24 NOTE — ED Notes (Signed)
[facility] called @ 1538-per Charge RN-called by Marylene Land

## 2018-03-24 NOTE — Progress Notes (Signed)
CRITICAL VALUE ALERT  Critical Value: Troponin post cath 5.58  Date & Time Notied: 03/24/18 2300  Provider Notified: On call fellow notified

## 2018-03-24 NOTE — ED Notes (Signed)
Dr. Excell Seltzer in to assess pt    To cath lab

## 2018-03-24 NOTE — ED Provider Notes (Signed)
[place] [facility] CARDIOVASCULAR ICU Provider Note   CSN: [id number] Arrival date & time: 03/24/18  1512     History   Chief Complaint Chief Complaint  Patient presents with  . Chest Pain    HPI Rodney Walker is a 37 y.o. male.  HPI  Presents with concern for chest pain beginning one hour prior to arrival. Wing, central, nonradiating chest pain with associated nausea and vomiting.  No shortness of breath.  Hx of prior MI in 2009.  This feels similar but pain lasting longer.  No longer sees cardiology. Smokes cigarettes, marijuana no other drugs.  No etoh.  Fam hd of early CAD. No abdominal pain. No leg pain or swelling.    Past Medical History:  Diagnosis Date  . Chest pain 2010  . Coronary artery disease    SPONTANEOUS DISSECTION AND PLAQUE RUPTURE OF HIS LEFT ANTERIOR DESCENDING ARTERY  . Eczema   . Emphysema lung (HCC)   . Empyema lung (HCC)   . HIV infection (HCC)   . Hyperlipidemia   . Immune deficiency disorder (HCC)   . MI (myocardial infarction) (HCC)   . Moderate persistent asthma without complication 10/29/2016    Patient Active Problem List   Diagnosis Date Noted  . STEMI involving right coronary artery (HCC) 03/24/2018  . Marijuana abuse, continuous 10/29/2016  . Moderate persistent asthma without complication 10/29/2016  . Allergic rhinitis 10/08/2016  . Cough 02/20/2016  . Anemia 05/03/2015  . Lung abscess (HCC) 04/22/2015  . Sepsis due to pneumonia (HCC) 04/17/2015  . Sepsis (HCC) 04/17/2015  . Bullous emphysema (HCC)   . Lobar pneumonia (HCC)   . Hyperkalemia 04/16/2015  . Empyema lung (HCC) 04/16/2015  . PNA (pneumonia) 04/16/2015  . HIV (human immunodeficiency virus infection) (HCC)   . Tobacco abuse 03/21/2015  . Lung mass 08/31/2014  . 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 Myalgia 04/23/2014  . Pharyngitis 03/31/2014  . Hyponatremia 03/31/2014  . Fever 03/30/2014  . AIDS (HCC) 03/22/2014  . Thrush 03/22/2014  . CAP  (community acquired pneumonia) 03/22/2014  . CAD (coronary artery disease) 11/13/2011    Past Surgical History:  Procedure Laterality Date  . CARDIAC CATHETERIZATION  06/24/2009   EF 60%  . CARDIAC CATHETERIZATION  06/17/2009   EF 45%. ANTERIOR HYPOKINESIS  . US ECHOCARDIOGRAPHY  12/09/2009   EF 55-60%  . VIDEO ASSISTED THORACOSCOPY (VATS)/ LOBECTOMY Right 04/23/2015   Procedure: VIDEO ASSISTED THORACOSCOPY (VATS)/RIGHT upper  and right middle LOBECTOMY;  Surgeon: Kerin Perna, MD;  Location: [facility] OR;  Service: Thoracic;  Laterality: Right;  Marland Kitchen VIDEO BRONCHOSCOPY Bilateral 09/05/2014   Procedure: VIDEO BRONCHOSCOPY WITH FLUORO;  Surgeon: Merwyn Katos, MD;  Location: [facility] ENDOSCOPY;  Service: Cardiopulmonary;  Laterality: Bilateral;  . VIDEO BRONCHOSCOPY Bilateral 04/10/2015   Procedure: VIDEO BRONCHOSCOPY WITH FLUORO;  Surgeon: Lupita Leash, MD;  Location: [facility] ENDOSCOPY;  Service: Cardiopulmonary;  Laterality: Bilateral;        Home Medications    Prior to Admission medications   Medication Sig Start Date End Date Taking? Authorizing Provider  albuterol (PROVENTIL HFA;VENTOLIN HFA) 108 (90 Base) MCG/ACT inhaler Inhale 4 puffs into the lungs every 4 (four) hours as needed for wheezing or shortness of breath. 10/29/16   Alfonse Spruce, MD  betamethasone dipropionate (DIPROLENE) 0.05 % cream Apply 2 (two) times daily topically. 11/02/17   Judyann Munson, MD  bictegravir-emtricitabine-tenofovir AF (BIKTARVY) 50-200-25 MG TABS tablet Take 1 tablet daily by mouth. 11/03/17   Judyann Munson, MD  cetirizine (ZYRTEC) 10 MG tablet Take 1 tablet (10 mg total) daily by mouth. 11/02/17   Judyann Munson, MD  chlorhexidine (HIBICLENS) 4 % external liquid Apply topically daily as needed. Dilute 10-15 mL in water, soak finger in this 10/26/17   Domenick Gong, MD  darunavir-cobicistat (PREZCOBIX) 800-150 MG tablet Take 1 tablet daily with breakfast by mouth. Swallow whole. Do NOT crush, break  or chew tablets. Take with food. 11/03/17   Judyann Munson, MD  doxycycline (VIBRA-TABS) 100 MG tablet Take 1 tablet (100 mg total) 2 (two) times daily by mouth. 11/02/17   Judyann Munson, MD  EPINEPHrine 0.3 mg/0.3 mL IJ SOAJ injection Inject 0.3 mLs (0.3 mg total) into the muscle once. 10/29/16 04/27/17  Alfonse Spruce, MD  flunisolide (NASALIDE) 25 MCG/ACT (0.025%) SOLN Place 2 sprays daily into the nose. 11/02/17   Judyann Munson, MD  ibuprofen (ADVIL,MOTRIN) 400 MG tablet Take 1 tablet (400 mg total) by mouth every 6 (six) hours as needed. 10/26/17   Domenick Gong, MD  triamcinolone ointment (KENALOG) 0.1 % Apply 1 application topically 2 (two) times daily. 02/01/17   Judyann Munson, MD    Family History Family History  Problem Relation Age of Onset  . Diabetes Mother   . Eczema Mother   . Asthma Mother   . Liver cancer Father   . Asthma Brother     Social History Social History   Tobacco Use  . Smoking status: Former Smoker    Packs/day: 0.25    Years: 17.00    Pack years: 4.25    Types: Cigarettes    Last attempt to quit: 03/23/2015    Years since quitting: 3.0  . Smokeless tobacco: Never Used  Substance Use Topics  . Alcohol use: No    Alcohol/week: 0.0 oz  . Drug use: Yes    Frequency: 2.0 times per week    Types: Marijuana     Allergies   Patient has no known allergies.   Review of Systems Review of Systems  Constitutional: Positive for diaphoresis. Negative for fever.  HENT: Negative for sore throat.   Eyes: Negative for visual disturbance.  Respiratory: Negative for shortness of breath.   Cardiovascular: Positive for chest pain.  Gastrointestinal: Positive for nausea and vomiting. Negative for abdominal pain.  Genitourinary: Negative for difficulty urinating.  Musculoskeletal: Negative for back pain and neck stiffness.  Skin: Negative for rash.  Neurological: Negative for syncope and headaches.     Physical Exam Updated Vital Signs BP  95/65 (BP Location: Right Arm)   Pulse 67   Temp 98.8 F (37.1 C) (Oral)   Resp 19   Ht 5\' 4"  (1.626 m)   Wt 61 kg (134 lb 7.7 oz)   SpO2 96%   BMI 23.08 kg/m   Physical Exam  Constitutional: He is oriented to person, place, and time. He appears well-developed and well-nourished. No distress.  Appears fatigued  HENT:  Head: Normocephalic and atraumatic.  Eyes: Conjunctivae and EOM are normal.  Neck: Normal range of motion.  Cardiovascular: Normal rate, regular rhythm, normal heart sounds and intact distal pulses. Exam reveals no gallop and no friction rub.  No murmur heard. Pulmonary/Chest: Effort normal and breath sounds normal. No respiratory distress. He has no wheezes. He has no rales.  Abdominal: Soft. He exhibits no distension. There is no tenderness. There is no guarding.  Musculoskeletal: He exhibits no edema.  Neurological: He is alert and oriented to person, place, and time.  Skin: Skin is  warm. He is diaphoretic.  Nursing note and vitals reviewed.    ED Treatments / Results  Labs (all labs ordered are listed, but only abnormal results are displayed) Labs Reviewed  CBC WITH DIFFERENTIAL/PLATELET - Abnormal; Notable for the following components:      Result Value   WBC 14.8 (*)    HCT 38.7 (*)    MCHC 36.4 (*)    Neutro Abs 10.0 (*)    Monocytes Absolute 1.2 (*)    All other components within normal limits  COMPREHENSIVE METABOLIC PANEL - Abnormal; Notable for the following components:   Potassium 3.1 (*)    CO2 20 (*)    Glucose, Bld 170 (*)    Creatinine, Ser 1.34 (*)    ALT 10 (*)    All other components within normal limits  TROPONIN I - Abnormal; Notable for the following components:   Troponin I 0.06 (*)    All other components within normal limits  LIPID PANEL - Abnormal; Notable for the following components:   LDL Cholesterol 106 (*)    All other components within normal limits  CBC - Abnormal; Notable for the following components:   RBC 3.78  (*)    Hemoglobin 12.1 (*)    HCT 33.4 (*)    MCHC 36.2 (*)    All other components within normal limits  TSH - Abnormal; Notable for the following components:   TSH 0.297 (*)    All other components within normal limits  TROPONIN I - Abnormal; Notable for the following components:   Troponin I 5.58 (*)    All other components within normal limits  TROPONIN I - Abnormal; Notable for the following components:   Troponin I 7.58 (*)    All other components within normal limits  I-STAT TROPONIN, ED - Abnormal; Notable for the following components:   Troponin i, poc 0.10 (*)    All other components within normal limits  MRSA PCR SCREENING  PROTIME-INR  APTT  CREATININE, SERUM  HEMOGLOBIN A1C  BRAIN NATRIURETIC PEPTIDE  PLATELET COUNT  TROPONIN I  LIPID PANEL  BASIC METABOLIC PANEL  CBC  POCT ACTIVATED CLOTTING TIME    EKG EKG Interpretation  Date/Time:  Thursday March 24 2018 15:16:48 EDT Ventricular Rate:  63 PR Interval:  174 QRS Duration: 90 QT Interval:  414 QTC Calculation: 423 R Axis:   76 Text Interpretation:  Normal sinus rhythm with sinus arrhythmia Anterior infarct , possibly acute Inferior injury pattern ** ** ACUTE MI / STEMI ** ** Consider right ventricular involvement in acute inferior infarct Abnormal ECG Since prior ECG, STE new Reconfirmed by Alvira Monday ([id number]) on 03/25/2018 2:03:43 AM   Radiology Dg Chest Port 1 View  Result Date: 03/24/2018 CLINICAL DATA:  Chest pain EXAM: PORTABLE CHEST 1 VIEW COMPARISON:  09/25/2017 FINDINGS: Cardiac shadow is stable. Some scarring is again noted in the right lung stable from the prior exam. There is been improved aeration with decrease in right basilar effusion. No focal confluent infiltrate is seen. No acute bony abnormality is noted. IMPRESSION: Chronic changes without acute abnormality. Electronically Signed   By: Alcide Clever M.D.   On: 03/24/2018 15:49    Procedures .Critical Care Performed by:  Alvira Monday, MD Authorized by: Alvira Monday, MD   Critical care provider statement:    Critical care time (minutes):  30   Critical care was necessary to treat or prevent imminent or life-threatening deterioration of the following conditions:  Cardiac failure  Critical care was time spent personally by me on the following activities:  Re-evaluation of patient's condition, ordering and review of radiographic studies, ordering and review of laboratory studies, ordering and performing treatments and interventions, review of old charts, evaluation of patient's response to treatment and discussions with consultants   (including critical care time)  Medications Ordered in ED Medications  nitroGLYCERIN (NITROSTAT) SL tablet 0.4 mg ( Sublingual MAR Unhold 03/24/18 1742)  heparin 5000 UNIT/ML injection (has no administration in time range)  ondansetron (ZOFRAN) 4 MG/2ML injection (has no administration in time range)  loratadine (CLARITIN) tablet 10 mg (has no administration in time range)  aspirin EC tablet 81 mg (has no administration in time range)  acetaminophen (TYLENOL) tablet 650 mg (has no administration in time range)  ondansetron (ZOFRAN) injection 4 mg (has no administration in time range)  heparin injection 5,000 Units (has no administration in time range)  metoprolol tartrate (LOPRESSOR) tablet 12.5 mg (12.5 mg Oral Given 03/24/18 2108)  labetalol (NORMODYNE,TRANDATE) injection 10 mg (has no administration in time range)  hydrALAZINE (APRESOLINE) injection 5 mg (has no administration in time range)  0.9% sodium chloride infusion (1 mL/kg/hr  61 kg Intravenous Rate/Dose Verify 03/24/18 2000)  sodium chloride flush (NS) 0.9 % injection 3 mL (has no administration in time range)  sodium chloride flush (NS) 0.9 % injection 3 mL (has no administration in time range)  0.9 %  sodium chloride infusion (has no administration in time range)  oxyCODONE (Oxy IR/ROXICODONE) immediate  release tablet 5-10 mg (10 mg Oral Given 03/24/18 1848)  morphine 2 MG/ML injection 2 mg (has no administration in time range)  tirofiban (AGGRASTAT) infusion 50 mcg/mL 100 mL (0 mcg/kg/min  61 kg Intravenous Stopped 03/25/18 0014)  albuterol (PROVENTIL) (2.5 MG/3ML) 0.083% nebulizer solution 2.5 mg (has no administration in time range)  prasugrel (EFFIENT) tablet 10 mg (has no administration in time range)  atorvastatin (LIPITOR) tablet 20 mg (has no administration in time range)  bictegravir-emtricitabine-tenofovir AF (BIKTARVY) 50-200-25 MG per tablet 1 tablet (1 tablet Oral Given 03/24/18 2107)  darunavir-cobicistat (PREZCOBIX) 800-150 MG per tablet 1 tablet (1 tablet Oral Given 03/24/18 2107)  aspirin chewable tablet 324 mg (324 mg Oral Given 03/24/18 1538)  heparin injection 3,650 Units (3,650 Units Intravenous Given 03/24/18 1536)  ondansetron (ZOFRAN) injection 4 mg (4 mg Intravenous Given 03/24/18 1558)  tirofiban (AGGRASTAT) infusion 50 mcg/mL 100 mL (0.15 mcg/kg/min  61.2 kg Intravenous New Bag/Given 03/24/18 1627)  heparin infusion 2 units/mL in 0.9 % sodium chloride (500 mLs Other New Bag/Given 03/24/18 1636)  prasugrel (EFFIENT) tablet 30 mg (30 mg Oral Given 03/24/18 1948)     Initial Impression / Assessment and Plan / ED Course  I have reviewed the triage vital signs and the nursing notes.  Pertinent labs & imaging results that were available during my care of the patient were reviewed by me and considered in my medical decision making (see chart for details).     37 year old male with a history of HIV, prior history of coronary artery disease and coronary artery dissection and 2009, hyperlipidemia, bullous emphysema, with history of VATS and lobectomy, who presents with concern for acute chest pain beginning 1 hour prior to arrival.  EKG evaluated by me on arrival concerning for inferior ST elevation MI.  Called code STEMI and evaluated patient, with history consistent with likely  ACS.  He was given aspirin and heparin. Initial blood pressures were normal, however dropped rapidly after 1 nitro.  Reported  some improved chest pain from 10-9 with nitroglycerin, however blood pressures decreased to 80s. Cardiology at bedside to evaluate the patient and take him to cath lab.  He was given IV fluids with some improvement in BP immediately prior to going to the Cath Lab.   Final Clinical Impressions(s) / ED Diagnoses   Final diagnoses:  ST elevation myocardial infarction involving right coronary artery [facility])    ED Discharge Orders    None       Alvira Monday, MD 03/25/18 [phone]

## 2018-03-24 NOTE — H&P (Signed)
Cardiology Admission History and Physical:   Patient ID: Rodney Walker; MRN: [id number]; DOB: [birth year]   Admission date: 03/24/2018  Primary Care Provider: Hoy Register, MD Primary Cardiologist: Dr Elease Hashimoto  Chief Complaint:  Chest pain  Patient Profile:   Rodney Walker is a 37 y.o. male with a history of prior anterior MI treated medically in 2010, and well-controlled HIV  History of Present Illness:   Rodney Walker is a 37 year old male who presents with 1 hour of severe sharp central chest pain.  His EKG shows anterior and inferior ST elevation consistent with an acute STEMI.  A code STEMI is paged out.  At the time of my evaluation in the emergency room, the patient is very diaphoretic, moaning in pain, and vomiting.  He really is unable to provide any meaningful history other than saying that he hurts.  He is not short of breath.  He denies syncope.  The patient's niece is at the bedside and states that he was in his normal state of health until he called her this afternoon and told her he was very sick.  In reviewing the patient's history, he had an anterior MI with plaque rupture and thrombus in 2010.  The patient was treated medically at that time with aggressive IV antiplatelet and antithrombotic's.  Relook catheterization demonstrated healing of the area and he did not require percutaneous coronary intervention.  It does not appear that he has had regular cardiac follow-up since about 2012.  Past Medical History:  Diagnosis Date  . Chest pain 2010  . Coronary artery disease    SPONTANEOUS DISSECTION AND PLAQUE RUPTURE OF HIS LEFT ANTERIOR DESCENDING ARTERY  . Eczema   . Emphysema lung (HCC)   . Empyema lung (HCC)   . HIV infection (HCC)   . Hyperlipidemia   . Immune deficiency disorder (HCC)   . MI (myocardial infarction) (HCC)   . Moderate persistent asthma without complication 10/29/2016    Past Surgical History:  Procedure Laterality Date  .  CARDIAC CATHETERIZATION  06/24/2009   EF 60%  . CARDIAC CATHETERIZATION  06/17/2009   EF 45%. ANTERIOR HYPOKINESIS  . US ECHOCARDIOGRAPHY  12/09/2009   EF 55-60%  . VIDEO ASSISTED THORACOSCOPY (VATS)/ LOBECTOMY Right 04/23/2015   Procedure: VIDEO ASSISTED THORACOSCOPY (VATS)/RIGHT upper  and right middle LOBECTOMY;  Surgeon: Kerin Perna, MD;  Location: [facility] OR;  Service: Thoracic;  Laterality: Right;  Marland Kitchen VIDEO BRONCHOSCOPY Bilateral 09/05/2014   Procedure: VIDEO BRONCHOSCOPY WITH FLUORO;  Surgeon: Merwyn Katos, MD;  Location: [facility] ENDOSCOPY;  Service: Cardiopulmonary;  Laterality: Bilateral;  . VIDEO BRONCHOSCOPY Bilateral 04/10/2015   Procedure: VIDEO BRONCHOSCOPY WITH FLUORO;  Surgeon: Lupita Leash, MD;  Location: [facility] ENDOSCOPY;  Service: Cardiopulmonary;  Laterality: Bilateral;     Medications Prior to Admission: Prior to Admission medications   Medication Sig Start Date End Date Taking? Authorizing Provider  albuterol (PROVENTIL HFA;VENTOLIN HFA) 108 (90 Base) MCG/ACT inhaler Inhale 4 puffs into the lungs every 4 (four) hours as needed for wheezing or shortness of breath. 10/29/16   Alfonse Spruce, MD  betamethasone dipropionate (DIPROLENE) 0.05 % cream Apply 2 (two) times daily topically. 11/02/17   Judyann Munson, MD  bictegravir-emtricitabine-tenofovir AF (BIKTARVY) 50-200-25 MG TABS tablet Take 1 tablet daily by mouth. 11/03/17   Judyann Munson, MD  cetirizine (ZYRTEC) 10 MG tablet Take 1 tablet (10 mg total) daily by mouth. 11/02/17   Judyann Munson, MD  chlorhexidine (HIBICLENS) 4 % external liquid Apply topically daily  as needed. Dilute 10-15 mL in water, soak finger in this 10/26/17   Domenick Gong, MD  darunavir-cobicistat (PREZCOBIX) 800-150 MG tablet Take 1 tablet daily with breakfast by mouth. Swallow whole. Do NOT crush, break or chew tablets. Take with food. 11/03/17   Judyann Munson, MD  doxycycline (VIBRA-TABS) 100 MG tablet Take 1 tablet (100 mg total) 2 (two)  times daily by mouth. 11/02/17   Judyann Munson, MD  EPINEPHrine 0.3 mg/0.3 mL IJ SOAJ injection Inject 0.3 mLs (0.3 mg total) into the muscle once. 10/29/16 04/27/17  Alfonse Spruce, MD  flunisolide (NASALIDE) 25 MCG/ACT (0.025%) SOLN Place 2 sprays daily into the nose. 11/02/17   Judyann Munson, MD  ibuprofen (ADVIL,MOTRIN) 400 MG tablet Take 1 tablet (400 mg total) by mouth every 6 (six) hours as needed. 10/26/17   Domenick Gong, MD  triamcinolone ointment (KENALOG) 0.1 % Apply 1 application topically 2 (two) times daily. 02/01/17   Judyann Munson, MD     Allergies:   No Known Allergies  Social History:   Social History   Socioeconomic History  . Marital status: Single    Spouse name: Not on file  . Number of children: Not on file  . Years of education: Not on file  . Highest education level: Not on file  Occupational History  . Occupation: unemployed  Social Needs  . Financial resource strain: Not on file  . Food insecurity:    Worry: Not on file    Inability: Not on file  . Transportation needs:    Medical: Not on file    Non-medical: Not on file  Tobacco Use  . Smoking status: Former Smoker    Packs/day: 0.25    Years: 17.00    Pack years: 4.25    Types: Cigarettes    Last attempt to quit: 03/23/2015    Years since quitting: 3.0  . Smokeless tobacco: Never Used  Substance and Sexual Activity  . Alcohol use: No    Alcohol/week: 0.0 oz  . Drug use: Yes    Frequency: 2.0 times per week    Types: Marijuana  . Sexual activity: Not Currently    Partners: Male    Comment: given condoms  Lifestyle  . Physical activity:    Days per week: Not on file    Minutes per session: Not on file  . Stress: Not on file  Relationships  . Social connections:    Talks on phone: Not on file    Gets together: Not on file    Attends religious service: Not on file    Active member of club or organization: Not on file    Attends meetings of clubs or organizations: Not on file      Relationship status: Not on file  . Intimate partner violence:    Fear of current or ex partner: Not on file    Emotionally abused: Not on file    Physically abused: Not on file    Forced sexual activity: Not on file  Other Topics Concern  . Not on file  Social History Narrative  . Not on file    Family History:   The patient's family history includes Asthma in his brother and mother; Diabetes in his mother; Eczema in his mother; Liver cancer in his father.    ROS:  Please see the history of present illness.  Unable to obtain because of the severity of the patient's illness.  Uncomfortable  Physical Exam/Data:   Vitals:  03/24/18 1518 03/24/18 1523 03/24/18 1545  BP: 107/61  (!) 89/50  Pulse: 66  71  Resp: 18  (!) 27  Temp: 97.9 F (36.6 C)    TempSrc: Oral    SpO2: 100%  100%  Weight: 135 lb (61.2 kg) 135 lb (61.2 kg)   Height: 5\' 4"  (1.626 m)     No intake or output data in the 24 hours ending 03/24/18 1605 Filed Weights   03/24/18 1518 03/24/18 1523  Weight: 135 lb (61.2 kg) 135 lb (61.2 kg)   Body mass index is 23.17 kg/m.  General:, Diaphoretic male in severe distress with chest pain, thin HEENT: normal Lymph: no adenopathy Neck: no JVD Endocrine:  No thryomegaly Vascular: No carotid bruits;  Cardiac:  normal S1, S2; RRR; no murmur distant heart sounds Lungs:  clear to auscultation bilaterally, no wheezing, rhonchi or rales  Abd: soft, nontender, no hepatomegaly  Ext: no edema Musculoskeletal:  No deformities, BUE and BLE strength normal and equal Skin: warm and dry  Neuro:  CNs 2-12 intact, no focal abnormalities noted Psych:  Normal affect    EKG:  The ECG that was done today was personally reviewed and demonstrates NSR with acute inferior injury and anterior injury  Laboratory Data:  ChemistryNo results for input(s): NA, K, CL, CO2, GLUCOSE, BUN, CREATININE, CALCIUM, GFRNONAA, GFRAA, ANIONGAP in the last 168 hours.  No results for input(s):  PROT, ALBUMIN, AST, ALT, ALKPHOS, BILITOT in the last 168 hours. Hematology Recent Labs  Lab 03/24/18 1530  WBC 14.8*  RBC 4.38  HGB 14.1  HCT 38.7*  MCV 88.4  MCH 32.2  MCHC 36.4*  RDW 13.7  PLT 262   Cardiac EnzymesNo results for input(s): TROPONINI in the last 168 hours.  Recent Labs  Lab 03/24/18 1537  TROPIPOC 0.10*    BNPNo results for input(s): BNP, PROBNP in the last 168 hours.  DDimer No results for input(s): DDIMER in the last 168 hours.  Radiology/Studies:  Dg Chest Port 1 View  Result Date: 03/24/2018 CLINICAL DATA:  Chest pain EXAM: PORTABLE CHEST 1 VIEW COMPARISON:  09/25/2017 FINDINGS: Cardiac shadow is stable. Some scarring is again noted in the right lung stable from the prior exam. There is been improved aeration with decrease in right basilar effusion. No focal confluent infiltrate is seen. No acute bony abnormality is noted. IMPRESSION: Chronic changes without acute abnormality. Electronically Signed   By: Alcide Clever M.D.   On: 03/24/2018 15:49    Assessment and Plan:   1. Acute inferior STEMI: The patient is having chest pain with an EKG clearly diagnostic of inferior STEMI.  His EKG is compared to his previous EKGs and this finding is new.  He is diaphoretic and uncomfortable and requires emergency cardiac catheterization and probable primary PCI.  Emergency implied consent is obtained.  He has received aspirin and 4000 units of heparin in the emergency department.  Further plans pending his cardiac catheterization study.  We will institute aggressive secondary risk reduction measures such as antiplatelet therapy, lipid-lowering with a high intensity statin, and initiation of a beta-blocker as tolerated.  He is currently hypotensive in the emergency department and will initially resuscitate him with IV fluid bolus.  2.  HIV: We will continue his home regimen  Severity of Illness: The appropriate patient status for this patient is INPATIENT. Inpatient  status is judged to be reasonable and necessary in order to provide the required intensity of service to ensure the patient's safety. The patient's  presenting symptoms, physical exam findings, and initial radiographic and laboratory data in the context of their chronic comorbidities is felt to place them at high risk for further clinical deterioration. Furthermore, it is not anticipated that the patient will be medically stable for discharge from the hospital within 2 midnights of admission. The following factors support the patient status of inpatient.   " The patient's presenting symptoms include chest pain. " The worrisome physical exam findings include ST elevation on EKG. " The chronic co-morbidities include HIV.  * I certify that at the point of admission it is my clinical judgment that the patient will require inpatient hospital care spanning beyond 2 midnights from the point of admission due to high intensity of service, high risk for further deterioration and high frequency of surveillance required.*   For questions or updates, please contact [facility] Please consult [email or web address] for contact info under Cardiology/STEMI.    Signed, Tonny Bollman, MD  03/24/2018 4:05 PM

## 2018-03-24 NOTE — ED Notes (Signed)
All clothes removed and placed in patient belonging bag also black wallet and black cell phone.  Belonging bag with pt.

## 2018-03-24 NOTE — ED Notes (Signed)
Pt st's pain unchanged after first NTG.  Pt rates pain a #9 on pain scale.

## 2018-03-24 NOTE — ED Triage Notes (Signed)
Onset today developed chest pain denies shortness of breath. States pain 10/10 pressure without radiating pain and nausea

## 2018-03-24 NOTE — Progress Notes (Signed)
Patient informed of valuables policy and requests to keep wallet with him in his room. Patient offered to have wallet locked in secure locker with security, but declines at this time. [place], Mitzi Hansen

## 2018-03-25 ENCOUNTER — Other Ambulatory Visit: Payer: Self-pay

## 2018-03-25 ENCOUNTER — Encounter (HOSPITAL_COMMUNITY): Payer: Self-pay | Admitting: Cardiovascular Disease

## 2018-03-25 ENCOUNTER — Inpatient Hospital Stay (HOSPITAL_COMMUNITY): Payer: Medicare Other

## 2018-03-25 DIAGNOSIS — R079 Chest pain, unspecified: Secondary | ICD-10-CM

## 2018-03-25 DIAGNOSIS — E782 Mixed hyperlipidemia: Secondary | ICD-10-CM

## 2018-03-25 LAB — ECHOCARDIOGRAM COMPLETE
HEIGHTINCHES: 64 in
Weight: 2166.4 oz

## 2018-03-25 LAB — GLUCOSE, CAPILLARY
GLUCOSE-CAPILLARY: 93 mg/dL (ref 65–99)
Glucose-Capillary: 109 mg/dL — ABNORMAL HIGH (ref 65–99)
Glucose-Capillary: 106 mg/dL — ABNORMAL HIGH (ref 65–99)

## 2018-03-25 LAB — LIPID PANEL
CHOLESTEROL: 155 mg/dL (ref 0–200)
HDL: 40 mg/dL — ABNORMAL LOW (ref >40)
LDL Cholesterol: 95 mg/dL (ref 0–99)
Total CHOL/HDL Ratio: 3.9 RATIO
Triglycerides: 102 mg/dL (ref <150)
VLDL: 20 mg/dL (ref 0–40)

## 2018-03-25 LAB — BASIC METABOLIC PANEL
ANION GAP: 7 (ref 5–15)
BUN: 15 mg/dL (ref 6–20)
CALCIUM: 8.2 mg/dL — AB (ref 8.9–10.3)
CO2: 21 mmol/L — AB (ref 22–32)
Chloride: 105 mmol/L (ref 101–111)
Creatinine, Ser: 1.19 mg/dL (ref 0.61–1.24)
Glucose, Bld: 88 mg/dL (ref 65–99)
POTASSIUM: 3.3 mmol/L — AB (ref 3.5–5.1)
Sodium: 133 mmol/L — ABNORMAL LOW (ref 135–145)

## 2018-03-25 LAB — TROPONIN I
TROPONIN I: 7.58 ng/mL — AB (ref <0.03)
Troponin I: 8.62 ng/mL (ref <0.03)

## 2018-03-25 LAB — CBC
HEMATOCRIT: 33.3 % — AB (ref 39.0–52.0)
HEMOGLOBIN: 12.1 g/dL — AB (ref 13.0–17.0)
MCH: 31.8 pg (ref 26.0–34.0)
MCHC: 36.3 g/dL — ABNORMAL HIGH (ref 30.0–36.0)
MCV: 87.4 fL (ref 78.0–100.0)
Platelets: 225 10*3/uL (ref 150–400)
RBC: 3.81 MIL/uL — AB (ref 4.22–5.81)
RDW: 14.2 % (ref 11.5–15.5)
WBC: 9.8 10*3/uL (ref 4.0–10.5)

## 2018-03-25 MED ORDER — PERFLUTREN LIPID MICROSPHERE
1.0000 mL | INTRAVENOUS | Status: AC | PRN
  Administered 2018-03-25: 4 mL via INTRAVENOUS
  Filled 2018-03-25: qty 10

## 2018-03-25 MED ORDER — PERFLUTREN LIPID MICROSPHERE
INTRAVENOUS | Status: AC
  Administered 2018-03-25: 4 mL via INTRAVENOUS
  Filled 2018-03-25: qty 10

## 2018-03-25 MED FILL — Heparin Sodium (Porcine) 2 Unit/ML in Sodium Chloride 0.9%: INTRAMUSCULAR | Qty: 1000 | Status: AC

## 2018-03-25 NOTE — Plan of Care (Signed)
  Problem: Cardiac: Goal: Ability to achieve and maintain adequate cardiopulmonary perfusion will improve Outcome: Progressing Note:  Pt stable at this time. Will continue to monitor throughout the night for any changes. Pt planned to d/c 3/30.  Goal: Vascular access site(s) Level 0-1 will be maintained Outcome: Progressing Note:  Right radial site level "0". No complications noted.

## 2018-03-25 NOTE — Care Management Note (Signed)
Case Management Note  Patient Details  Name: Maiko Ifill MRN: [id number] Date of Birth: Mar 17, 1981  Subjective/Objective:     Pt admitted with STEMI               Action/Plan:  PTA independent from home.  Pt will discharge home on Brilinta - CM provided free 30 day card and contacted pharmacy of choice [facility] on [address] and confirmed that prescription can be filled.  CM submitted benefit check - copay $3.80 and PA not needed.    Expected Discharge Date:                  Expected Discharge Plan:  Home/Self Care  In-House Referral:     Discharge planning Services  CM Consult  Post Acute Care Choice:    Choice offered to:     DME Arranged:    DME Agency:     HH Arranged:    HH Agency:     Status of Service:  In process, will continue to follow  If discussed at Long Length of Stay Meetings, dates discussed:    Additional Comments:  Cherylann Parr, RN 03/25/2018, 2:11 PM

## 2018-03-25 NOTE — Progress Notes (Signed)
CARDIAC REHAB PHASE I   PRE:  Rate/Rhythm: 69 SR    BP: sitting 117/83    SaO2: 100 RA  MODE:  Ambulation: 410 ft   POST:  Rate/Rhythm: 76 SR    BP: sitting 118/81     SaO2: 99 RA  Tolerated well, somewhat SOB toward end of walk and talking. Pt did not seem bothered by it (and has hx of lung resection). Ed completed with pt with good understanding. Notified CM of need for Brilinta. He understands importance of Brilinta. Discussed risk factor modification and will refer to G'sO CRPII. Pt does not smoke cigarettes anymore but does enjoy smoking marijuana twice a week. Encouraged him to discuss risk/benefit with MD.  [id number]   Harriet Masson CES, ACSM 03/25/2018 12:18 PM

## 2018-03-25 NOTE — Progress Notes (Signed)
Pt expressed to this RN that's he has multiple complaints regarding his stay and he is ready to leave AMA. Pt with c/o of his dinner tray not being acceptable and staff not responding to his needs in a timely manner. Pt states that "I only have to stay another night because the hospital wants my money." This RN tried in multiple ways to rectify the situation to convince pt the importance of staying until officially discharged from the hospital for medical reasons. Pt refusing to stay and wanting to speak with the MD. On-call cardiologist Dr. Allena Earing paged about pt's concerns. MD came to speak with pt at bedside. Pt still not happy about prior treatment but is willing to stay until discharge; pending 3/30. All concerns at this time have been addressed to pt's satisfaction. Will continue to monitor. Viviano Simas, RN

## 2018-03-25 NOTE — Progress Notes (Signed)
Progress Note  Patient Name: Rodney Walker Date of Encounter: 03/25/2018  Primary Cardiologist: previous Sanav Remer   Subjective   37 yo with hx of previous ant. MI in 2010 Presents with SSCP and STEMI. + HIV   Cath yesterday show LAD - minimal disease LCx - minimal disease RCA - 100% occluded -  Stented with 4.0 x 23 mm  Sierra DES  Started on Brilinta and ASA  Recommended long term antiplatelet meds.   Inpatient Medications    Scheduled Meds: . aspirin EC  81 mg Oral Daily  . atorvastatin  20 mg Oral q1800  . bictegravir-emtricitabine-tenofovir AF  1 tablet Oral Daily  . darunavir-cobicistat  1 tablet Oral Q breakfast  . heparin  5,000 Units Subcutaneous Q8H  . loratadine  10 mg Oral Daily  . metoprolol tartrate  12.5 mg Oral BID  . prasugrel  10 mg Oral Daily  . sodium chloride flush  3 mL Intravenous Q12H   Continuous Infusions: . sodium chloride     PRN Meds: sodium chloride, acetaminophen, albuterol, morphine injection, nitroGLYCERIN, ondansetron (ZOFRAN) IV, oxyCODONE, sodium chloride flush   Vital Signs    Vitals:   03/25/18 0400 03/25/18 0500 03/25/18 0600 03/25/18 0700  BP: 92/65  112/68 106/63  Pulse: 69 80 71 63  Resp: 20 (!) 24 18 (!) 21  Temp:      TempSrc:      SpO2: 97% 96% 99% 95%  Weight:  135 lb 6.4 oz (61.4 kg)    Height:        Intake/Output Summary (Last 24 hours) at 03/25/2018 0807 Last data filed at 03/25/2018 0400 Gross per 24 hour  Intake 1068.31 ml  Output -  Net 1068.31 ml   Filed Weights   03/24/18 1523 03/24/18 1730 03/25/18 0500  Weight: 135 lb (61.2 kg) 134 lb 7.7 oz (61 kg) 135 lb 6.4 oz (61.4 kg)    Telemetry     NSR  - Personally Reviewed  ECG     NSR  - Personally Reviewed  Physical Exam   GEN: No acute distress.   Neck: No JVD Cardiac: RRR, no murmurs, rubs, or gallops.  Respiratory: Clear to auscultation bilaterally. GI: Soft, nontender, non-distended  MS: No edema; No deformity. Right  radial cath site , clean and dry  Neuro:  Nonfocal  Psych: Normal affect   Labs    Chemistry Recent Labs  Lab 03/24/18 1530 03/24/18 2134 03/25/18 0519  NA 136  --  133*  K 3.1*  --  3.3*  CL 103  --  105  CO2 20*  --  21*  GLUCOSE 170*  --  88  BUN 14  --  15  CREATININE 1.34* 1.19 1.19  CALCIUM 9.1  --  8.2*  PROT 7.4  --   --   ALBUMIN 3.7  --   --   AST 24  --   --   ALT 10*  --   --   ALKPHOS 79  --   --   BILITOT 0.6  --   --   GFRNONAA >60 >60 >60  GFRAA >60 >60 >60  ANIONGAP 13  --  7     Hematology Recent Labs  Lab 03/24/18 1530 03/24/18 2134 03/24/18 2318 03/25/18 0519  WBC 14.8* 8.1  --  9.8  RBC 4.38 3.78*  --  3.81*  HGB 14.1 12.1*  --  12.1*  HCT 38.7* 33.4*  --  33.3*  MCV 88.4  88.4  --  87.4  MCH 32.2 32.0  --  31.8  MCHC 36.4* 36.2*  --  36.3*  RDW 13.7 14.1  --  14.2  PLT 262 203 217 225    Cardiac Enzymes Recent Labs  Lab 03/24/18 1530 03/24/18 2134 03/24/18 2318 03/25/18 0519  TROPONINI 0.06* 5.58* 7.58* 8.62*    Recent Labs  Lab 03/24/18 1537  TROPIPOC 0.10*     BNP Recent Labs  Lab 03/24/18 2134  BNP 36.3     DDimer No results for input(s): DDIMER in the last 168 hours.   Radiology    Dg Chest Port 1 View  Result Date: 03/24/2018 CLINICAL DATA:  Chest pain EXAM: PORTABLE CHEST 1 VIEW COMPARISON:  09/25/2017 FINDINGS: Cardiac shadow is stable. Some scarring is again noted in the right lung stable from the prior exam. There is been improved aeration with decrease in right basilar effusion. No focal confluent infiltrate is seen. No acute bony abnormality is noted. IMPRESSION: Chronic changes without acute abnormality. Electronically Signed   By: Alcide Clever M.D.   On: 03/24/2018 15:49    Cardiac Studies      Patient Profile     37 y.o. male with hx of CAD , presented with STEMI  Now s/p PCI of RCA   Assessment & Plan    1.  CAD  - hx of LAD MI in 2010. Now with occlusion of RCA S/p stenting  On Brilinta  and ASA for a year I would agree with long term antiplatelet therapy   2.  Hyperlipidemia :   LDL is 95, on atorva 20 , will increase to 40 mg a day   Transfer to floor today  Home tomorrow Will see APP in several weeks and will see me in several months      For questions or updates, please contact [facility] Please consult [email or web address] for contact info under Cardiology/STEMI.      Signed, Kristeen Miss, MD  03/25/2018, 8:07 AM

## 2018-03-25 NOTE — Progress Notes (Signed)
  Echocardiogram 2D Echocardiogram with definity has been performed.  Leta Jungling M 03/25/2018, 11:29 AM

## 2018-03-26 ENCOUNTER — Other Ambulatory Visit: Payer: Self-pay | Admitting: Physician Assistant

## 2018-03-26 DIAGNOSIS — I2510 Atherosclerotic heart disease of native coronary artery without angina pectoris: Secondary | ICD-10-CM

## 2018-03-26 DIAGNOSIS — E7800 Pure hypercholesterolemia, unspecified: Secondary | ICD-10-CM

## 2018-03-26 LAB — BASIC METABOLIC PANEL
ANION GAP: 10 (ref 5–15)
BUN: 10 mg/dL (ref 6–20)
CALCIUM: 8.8 mg/dL — AB (ref 8.9–10.3)
CO2: 22 mmol/L (ref 22–32)
Chloride: 105 mmol/L (ref 101–111)
Creatinine, Ser: 1.14 mg/dL (ref 0.61–1.24)
Glucose, Bld: 137 mg/dL — ABNORMAL HIGH (ref 65–99)
Potassium: 3.4 mmol/L — ABNORMAL LOW (ref 3.5–5.1)
SODIUM: 137 mmol/L (ref 135–145)

## 2018-03-26 MED ORDER — CLOPIDOGREL BISULFATE 75 MG PO TABS: 75.0000 mg | ORAL_TABLET | Freq: Every day | ORAL | Status: DC

## 2018-03-26 MED ORDER — POTASSIUM CHLORIDE CRYS ER 20 MEQ PO TBCR
40.0000 meq | EXTENDED_RELEASE_TABLET | Freq: Once | ORAL | Status: AC
  Administered 2018-03-26: 40 meq via ORAL
  Filled 2018-03-26: qty 2

## 2018-03-26 MED ORDER — METOPROLOL TARTRATE 25 MG PO TABS: 12.5000 mg | ORAL_TABLET | Freq: Two times a day (BID) | ORAL | 6 refills | Status: DC

## 2018-03-26 MED ORDER — ASPIRIN 81 MG PO TBEC: 81.0000 mg | DELAYED_RELEASE_TABLET | Freq: Every day | ORAL | 3 refills | Status: DC

## 2018-03-26 MED ORDER — ATORVASTATIN CALCIUM 40 MG PO TABS: 40.0000 mg | ORAL_TABLET | Freq: Every day | ORAL | Status: DC

## 2018-03-26 MED ORDER — NITROGLYCERIN 0.4 MG SL SUBL: 0.4000 mg | SUBLINGUAL_TABLET | SUBLINGUAL | 2 refills | Status: DC | PRN

## 2018-03-26 MED ORDER — CLOPIDOGREL BISULFATE 75 MG PO TABS: 75.0000 mg | ORAL_TABLET | Freq: Every day | ORAL | 3 refills | Status: DC

## 2018-03-26 MED ORDER — CLOPIDOGREL BISULFATE 75 MG PO TABS
300.0000 mg | ORAL_TABLET | Freq: Once | ORAL | Status: AC
  Administered 2018-03-26: 300 mg via ORAL
  Filled 2018-03-26: qty 4

## 2018-03-26 MED ORDER — ATORVASTATIN CALCIUM 40 MG PO TABS: 40.0000 mg | ORAL_TABLET | Freq: Every day | ORAL | 3 refills | Status: DC

## 2018-03-26 NOTE — Care Management Note (Signed)
Case Management Note  Patient Details  Name: Glenden Danbury MRN: [id number] Date of Birth: 13-Nov-1981  Subjective/Objective: Pt presented for Stemi- hx CAD post cath with DES. PTA from home. Plan to return home. CM received consult for Effient-no discount card available 2/2 is generic now.                     Action/Plan: Per [facility] pt will need prior authorization- MD is aware plan to load with Plavix and office to follow for Effient Assistance. No further needs from CM at this time.   Expected Discharge Date:  03/26/18               Expected Discharge Plan:  Home/Self Care  In-House Referral:  NA  Discharge planning Services  CM Consult, Medication Assistance  Post Acute Care Choice:  NA Choice offered to:  NA  DME Arranged:  N/A DME Agency:  NA  HH Arranged:  NA HH Agency:  NA  Status of Service:  Completed, signed off  If discussed at Long Length of Stay Meetings, dates discussed:    Additional Comments:  Gala Lewandowsky, RN 03/26/2018, 11:06 AM

## 2018-03-26 NOTE — Progress Notes (Signed)
Pt was stable on discharge. PT was given instructions on current meds and any med changes. Pt was also advise to wait on ride to get here and to let us know before leaving.

## 2018-03-26 NOTE — Progress Notes (Signed)
RN talked to pt about pre- authorization needed for effient. RN instructed pt to inquire about the status of pre-authorization on Monday when he calls for his follow up appt.

## 2018-03-26 NOTE — Discharge Summary (Signed)
Discharge Summary    Patient ID: Rodney Walker,  MRN: [id number], DOB/AGE: 03-23-1981 37 y.o.  Admit date: 03/24/2018 Discharge date: 03/26/2018  Primary Care Provider: Hoy Register Primary Cardiologist: Kristeen Miss, MD  Discharge Diagnoses    Principal Problem:   STEMI involving right coronary artery [facility]) Active Problems:   CAD (coronary artery disease)   AIDS (HCC)   Eosinophilic folliculitis   Tobacco abuse   HIV (human immunodeficiency virus infection) (HCC)   Marijuana abuse, continuous   Allergies No Known Allergies  Diagnostic Studies/Procedures    Left heart cath 03/24/18:  Prox RCA to Mid RCA lesion is 100% stenosed.  Ost 1st Mrg lesion is 30% stenosed.  The left ventricular systolic function is normal.  LV end diastolic pressure is normal.  The left ventricular ejection fraction is greater than 65% by visual estimate.  A drug-eluting stent was successfully placed using a STENT SIERRA 4.00 X 23 MM.  Post intervention, there is a 0% residual stenosis.   1.  Severe single-vessel coronary artery disease with acute thrombotic total occlusion of the RCA, treated successfully with aspiration thrombectomy and drug-eluting stent implantation (4.0 x 23 mm Sierra DES) 2.  Patent left main, left circumflex, and LAD with minimal irregularity 3.  Preserved overall LV systolic function with LVEF 65% and normal LVEDP.  Recommendations: Dual antiplatelet therapy with aspirin and Brilinta at least 12 months.  The patient is now had 2 acute MIs in different vessels, both likely secondary to plaque rupture and possibly hypercoagulable condition.  Might consider long-term dual antiplatelet therapy if tolerated.  If no complications arise early discharge could be considered in 24-48 hours.   Echo 03/25/18: Study Conclusions - Procedure narrative: Transthoracic echocardiography. Image   quality was poor. The study was technically difficult, as a  result of poor acoustic windows. Intravenous contrast (Definity)   was administered. - Left ventricle: The cavity size was normal. There was mild   concentric hypertrophy. Systolic function was mildly to   moderately reduced. The estimated ejection fraction was in the   range of 40% to 45%. Inferior and inferoseptal hypokinesis. The   study is not technically sufficient to allow evaluation of LV   diastolic function. - Mitral valve: Mildly thickened leaflets . There was trivial   regurgitation. - Left atrium: The atrium was normal in size. - Inferior vena cava: The vessel was dilated. The respirophasic   diameter changes were blunted (< 50%), consistent with elevated   central venous pressure. - Impressions: Compared to a prior study in 2016, the LVEF is lower   at 40-45% with inferior and inferoseptal hypokinesis.  Impressions: - Compared to a prior study in 2016, the LVEF is lower at 40-45%   with inferior and inferoseptal hypokinesis.    History of Present Illness     Rodney Walker is a 37 y.o. male with a history of prior anterior MI treated medically in 2010, and well-controlled HIV.  [name] is a 37 year old male who presents with 1 hour of severe sharp central chest pain.  His EKG shows anterior and inferior ST elevation consistent with an acute STEMI.  A cotde STEMI is paged out.  At the time of my evaluation in the emergency room, the patient is very diaphoretic, moaning in pain, and vomiting.  He really is unable to provide any meaningful history other than saying that he hurts.  He is not short of breath.  He denies syncope.   The patient's  niece is at the bedside and states that he was in his normal state of health until he called her this afternoon and told her he was very sick.  In reviewing the patient's history, he had an anterior MI with plaque rupture and thrombus in 2010.  The patient was treated medically at that time with aggressive IV antiplatelet  and antithrombotic's.  Relook catheterization demonstrated healing of the area and he did not require percutaneous coronary intervention.  It does not appear that he has had regular cardiac follow-up since about 2012.  Hospital Course     Consultants: none  STEMI Patient was taken emergently to the cath lab. Left heart cath with 100% stenosis in the mid RCA treated with aspiration thrombectomy and DES. On cath, his LV appeared normal. He had a patent LAD, left Cx and left main. He tolerated the procedure well and was transported to a telemetry floor. Cath site is C/D/I.   DAPT x 12 months, possibly longterm In consultation with pharmacy, Brilinta is contraindicated in the setting of his antiretroviral medications for HIV/AIDS. Patient was started on effient in the hospital, but insurance is denying this medication (per [employer]). In consultation with Dr. Eldridge Dace (interventionalist on call), we will load with 300 mg plavix today with 75 mg plavix daily starting tomorrow. I will send a message to our pharmacy team to try to get him switched to effient next week, as this is preferred in the setting of his second STEMI.  He is discharged on ASA 81 mg and plavix 75 mg daily. Per cath report, will consider longterm DAPT given that this is his second MI.   Echocardiogram with mildly reduced EF of 40-45%.   HLD LDL goal is less than 70. Lipitor incrased to 40 mg daily. Will check FLP and LFTs in 6-8 weeks.   Hypokalemia K 3.3 on 08/25/18. It does not appear that this was replaced. Repeat BMP today with K 3.4 with a sCr of 1.14. Gave OTO dose of 40 mEq Kdur. Will repeat BMP on Monday in the clinic.   _____________  Discharge Vitals Blood pressure 100/74, pulse 95, temperature 98.3 F (36.8 C), temperature source Oral, resp. rate 19, height 5\' 4"  (1.626 m), weight 137 lb 5.6 oz (62.3 kg), SpO2 99 %.  Filed Weights   03/24/18 1730 03/25/18 0500 03/26/18 0508  Weight: 134 lb 7.7 oz (61  kg) 135 lb 6.4 oz (61.4 kg) 137 lb 5.6 oz (62.3 kg)    Labs & Radiologic Studies    CBC Recent Labs    03/24/18 1530 03/24/18 2134 03/24/18 2318 03/25/18 0519  WBC 14.8* 8.1  --  9.8  NEUTROABS 10.0*  --   --   --   HGB 14.1 12.1*  --  12.1*  HCT 38.7* 33.4*  --  33.3*  MCV 88.4 88.4  --  87.4  PLT 262 203 217 225   Basic Metabolic Panel Recent Labs    85/88/50 0519 03/26/18 0857  NA 133* 137  K 3.3* 3.4*  CL 105 105  CO2 21* 22  GLUCOSE 88 137*  BUN 15 10  CREATININE 1.19 1.14  CALCIUM 8.2* 8.8*   Liver Function Tests Recent Labs    03/24/18 1530  AST 24  ALT 10*  ALKPHOS 79  BILITOT 0.6  PROT 7.4  ALBUMIN 3.7   No results for input(s): LIPASE, AMYLASE in the last 72 hours. Cardiac Enzymes Recent Labs    03/24/18 2134 03/24/18 2318 03/25/18 2774  TROPONINI 5.58* 7.58* 8.62*   BNP Invalid input(s): POCBNP D-Dimer No results for input(s): DDIMER in the last 72 hours. Hemoglobin A1C Recent Labs    03/24/18 2134  HGBA1C 5.0   Fasting Lipid Panel Recent Labs    03/25/18 0519  CHOL 155  HDL 40*  LDLCALC 95  TRIG 540  CHOLHDL 3.9   Thyroid Function Tests Recent Labs    03/24/18 2134  TSH 0.297*   _____________  Dg Chest Port 1 View  Result Date: 03/24/2018 CLINICAL DATA:  Chest pain EXAM: PORTABLE CHEST 1 VIEW COMPARISON:  09/25/2017 FINDINGS: Cardiac shadow is stable. Some scarring is again noted in the right lung stable from the prior exam. There is been improved aeration with decrease in right basilar effusion. No focal confluent infiltrate is seen. No acute bony abnormality is noted. IMPRESSION: Chronic changes without acute abnormality. Electronically Signed   By: Alcide Clever M.D.   On: 03/24/2018 15:49   Disposition   Pt is being discharged home today in good condition.  Follow-up Plans & Appointments     Discharge Instructions    Amb Referral to Cardiac Rehabilitation   Complete by:  As directed    Diagnosis:   Coronary  Stents PTCA STEMI     Diet - low sodium heart healthy   Complete by:  As directed    Diet - low sodium heart healthy   Complete by:  As directed    Discharge instructions   Complete by:  As directed    No driving for 2 days. No lifting over 5 lbs for 1 week. No sexual activity for 1 week. You may return to work in 1 week. Keep procedure site clean & dry. If you notice increased pain, swelling, bleeding or pus, call/return!  You may shower, but no soaking baths/hot tubs/pools for 1 week.   Discharge instructions   Complete by:  As directed    No driving for 2 days. No lifting over 5 lbs for 1 week. No sexual activity for 1 week. You may return to work in 1 week. Keep procedure site clean & dry. If you notice increased pain, swelling, bleeding or pus, call/return!  You may shower, but no soaking baths/hot tubs/pools for 1 week.   You need to report to the cardiology office for a blood draw on Monday, March 28, 2018.   Increase activity slowly   Complete by:  As directed    Increase activity slowly   Complete by:  As directed       Discharge Medications   Allergies as of 03/26/2018   No Known Allergies     Medication List    STOP taking these medications   ibuprofen 400 MG tablet Commonly known as:  ADVIL,MOTRIN     TAKE these medications   acetaminophen 500 MG tablet Commonly known as:  TYLENOL Take 500 mg by mouth as needed for mild pain or headache.   albuterol 108 (90 Base) MCG/ACT inhaler Commonly known as:  PROVENTIL HFA;VENTOLIN HFA Inhale 4 puffs into the lungs every 4 (four) hours as needed for wheezing or shortness of breath.   aspirin 81 MG EC tablet Take 1 tablet (81 mg total) by mouth daily.   atorvastatin 40 MG tablet Commonly known as:  LIPITOR Take 1 tablet (40 mg total) by mouth daily at 6 PM.   betamethasone dipropionate 0.05 % cream Commonly known as:  DIPROLENE Apply 2 (two) times daily topically.   bictegravir-emtricitabine-tenofovir AF  50-200-25 MG  Tabs tablet Commonly known as:  BIKTARVY Take 1 tablet daily by mouth.   cetirizine 10 MG tablet Commonly known as:  ZYRTEC Take 1 tablet (10 mg total) daily by mouth.   chlorhexidine 4 % external liquid Commonly known as:  HIBICLENS Apply topically daily as needed. Dilute 10-15 mL in water, soak finger in this   clopidogrel 75 MG tablet Commonly known as:  PLAVIX Take 1 tablet (75 mg total) by mouth daily. Start taking on:  03/27/2018   darunavir-cobicistat 800-150 MG tablet Commonly known as:  PREZCOBIX Take 1 tablet daily with breakfast by mouth. Swallow whole. Do NOT crush, break or chew tablets. Take with food.   doxycycline 100 MG tablet Commonly known as:  VIBRA-TABS Take 1 tablet (100 mg total) 2 (two) times daily by mouth.   EPINEPHrine 0.3 mg/0.3 mL Soaj injection Commonly known as:  EPI-PEN Inject 0.3 mLs (0.3 mg total) into the muscle once.   flunisolide 25 MCG/ACT (0.025%) Soln Commonly known as:  NASALIDE Place 2 sprays daily into the nose.   metoprolol tartrate 25 MG tablet Commonly known as:  LOPRESSOR Take 0.5 tablets (12.5 mg total) by mouth 2 (two) times daily.   nitroGLYCERIN 0.4 MG SL tablet Commonly known as:  NITROSTAT Place 1 tablet (0.4 mg total) under the tongue every 5 (five) minutes x 3 doses as needed for chest pain.   tetrahydrozoline 0.05 % ophthalmic solution Place 1 drop into both eyes as needed (dry eyes).   triamcinolone ointment 0.1 % Commonly known as:  KENALOG Apply 1 application topically 2 (two) times daily.        Aspirin prescribed at discharge?  Yes High Intensity Statin Prescribed? (Lipitor 40-80mg  or Crestor 20-40mg ): Yes Beta Blocker Prescribed? Yes For EF <40%, was ACEI/ARB Prescribed? No: marginal pressure ADP Receptor Inhibitor Prescribed? (i.e. Plavix etc.-Includes Medically Managed Patients): Yes For EF <40%, Aldosterone Inhibitor Prescribed? No: marginal pressure Was EF assessed during THIS  hospitalization? Yes Was Cardiac Rehab II ordered? (Included Medically managed Patients): Yes   Outstanding Labs/Studies   BMP on 03/28/18  FLP, LFT in 6 weeks  Needs pharmacy to help get effient approved  Duration of Discharge Encounter   Greater than 30 minutes including physician time.  Signed, Roe Rutherford Duke NP 03/26/2018, 10:36 AM

## 2018-03-26 NOTE — Progress Notes (Signed)
Followed up on STAT BMET: It has been collected.

## 2018-03-26 NOTE — Progress Notes (Addendum)
Progress Note  Patient Name: Rodney Walker Date of Encounter: 03/26/2018  Primary Cardiologist: No primary care provider on file.   Subjective   Denies any CP.  Cath showed occluded RCA s/p DES now on DAPT  Inpatient Medications    Scheduled Meds: . aspirin EC  81 mg Oral Daily  . atorvastatin  20 mg Oral q1800  . bictegravir-emtricitabine-tenofovir AF  1 tablet Oral Daily  . darunavir-cobicistat  1 tablet Oral Q breakfast  . heparin  5,000 Units Subcutaneous Q8H  . loratadine  10 mg Oral Daily  . metoprolol tartrate  12.5 mg Oral BID  . prasugrel  10 mg Oral Daily  . sodium chloride flush  3 mL Intravenous Q12H   Continuous Infusions: . sodium chloride     PRN Meds: sodium chloride, acetaminophen, albuterol, morphine injection, nitroGLYCERIN, ondansetron (ZOFRAN) IV, oxyCODONE, sodium chloride flush   Vital Signs    Vitals:   03/25/18 1800 03/25/18 2028 03/25/18 2057 03/26/18 0508  BP: 120/68 135/82  100/74  Pulse: 67 (!) 52 86 95  Resp: (!) 21 19    Temp: 98.3 F (36.8 C) 98.5 F (36.9 C)  98.3 F (36.8 C)  TempSrc: Oral Oral  Oral  SpO2: 100% 100%  99%  Weight:    137 lb 5.6 oz (62.3 kg)  Height:        Intake/Output Summary (Last 24 hours) at 03/26/2018 0826 Last data filed at 03/25/2018 2030 Gross per 24 hour  Intake 600 ml  Output 750 ml  Net -150 ml   Filed Weights   03/24/18 1730 03/25/18 0500 03/26/18 0508  Weight: 134 lb 7.7 oz (61 kg) 135 lb 6.4 oz (61.4 kg) 137 lb 5.6 oz (62.3 kg)    Telemetry    NSR - Personally Reviewed  ECG    NSR with TWI in V1 and V2 - Personally Reviewed  Physical Exam   GEN: No acute distress.   Neck: No JVD Cardiac: RRR, no murmurs, rubs, or gallops.  Respiratory: Clear to auscultation bilaterally. GI: Soft, nontender, non-distended  MS: No edema; No deformity. Neuro:  Nonfocal  Psych: Normal affect   Labs    Chemistry Recent Labs  Lab 03/24/18 1530 03/24/18 2134 03/25/18 0519  NA  136  --  133*  K 3.1*  --  3.3*  CL 103  --  105  CO2 20*  --  21*  GLUCOSE 170*  --  88  BUN 14  --  15  CREATININE 1.34* 1.19 1.19  CALCIUM 9.1  --  8.2*  PROT 7.4  --   --   ALBUMIN 3.7  --   --   AST 24  --   --   ALT 10*  --   --   ALKPHOS 79  --   --   BILITOT 0.6  --   --   GFRNONAA >60 >60 >60  GFRAA >60 >60 >60  ANIONGAP 13  --  7     Hematology Recent Labs  Lab 03/24/18 1530 03/24/18 2134 03/24/18 2318 03/25/18 0519  WBC 14.8* 8.1  --  9.8  RBC 4.38 3.78*  --  3.81*  HGB 14.1 12.1*  --  12.1*  HCT 38.7* 33.4*  --  33.3*  MCV 88.4 88.4  --  87.4  MCH 32.2 32.0  --  31.8  MCHC 36.4* 36.2*  --  36.3*  RDW 13.7 14.1  --  14.2  PLT 262 203 217 225  Cardiac Enzymes Recent Labs  Lab 03/24/18 1530 03/24/18 2134 03/24/18 2318 03/25/18 0519  TROPONINI 0.06* 5.58* 7.58* 8.62*    Recent Labs  Lab 03/24/18 1537  TROPIPOC 0.10*     BNP Recent Labs  Lab 03/24/18 2134  BNP 36.3     DDimer No results for input(s): DDIMER in the last 168 hours.   Radiology    Dg Chest Port 1 View  Result Date: 03/24/2018 CLINICAL DATA:  Chest pain EXAM: PORTABLE CHEST 1 VIEW COMPARISON:  09/25/2017 FINDINGS: Cardiac shadow is stable. Some scarring is again noted in the right lung stable from the prior exam. There is been improved aeration with decrease in right basilar effusion. No focal confluent infiltrate is seen. No acute bony abnormality is noted. IMPRESSION: Chronic changes without acute abnormality. Electronically Signed   By: Alcide Clever M.D.   On: 03/24/2018 15:49    Cardiac Studies   Cardiac Cath Conclusion     Prox RCA to Mid RCA lesion is 100% stenosed.  Ost 1st Mrg lesion is 30% stenosed.  The left ventricular systolic function is normal.  LV end diastolic pressure is normal.  The left ventricular ejection fraction is greater than 65% by visual estimate.  A drug-eluting stent was successfully placed using a STENT SIERRA 4.00 X 23  MM.  Post intervention, there is a 0% residual stenosis.   1.  Severe single-vessel coronary artery disease with acute thrombotic total occlusion of the RCA, treated successfully with aspiration thrombectomy and drug-eluting stent implantation (4.0 x 23 mm Sierra DES) 2.  Patent left main, left circumflex, and LAD with minimal irregularity 3.  Preserved overall LV systolic function with LVEF 65% and normal LVEDP  Recommendations: Dual antiplatelet therapy with aspirin and Brilinta at least 12 months.  The patient is now had 2 acute MIs in different vessels, both likely secondary to plaque rupture and possibly hypercoagulable condition.  Might consider long-term dual antiplatelet therapy if tolerated.  If no complications arise early discharge could be considered in 24-48 hours.     Patient Profile      37 y.o. male with hx of CAD , presented with STEMI  Now s/p PCI of RCA     Assessment & Plan    1.  ASCAD - remote h/o AMI in 2010 with PCI of the LAD.  Now s/p inferior STEMI s/p PCI of occluded RCA.  Otherwise normal vessels.  Continue DAPT (ASA and Effient), statin and BB.  2.  Hyperlipidemia with LDL goal < 70.  Atorvastatin increased this admit to 40mg  daily.  Need FLP and ALT in 6 weeks.   3.  Hypokalemia - K repleted.  Will check BMET this am  He is stable from a cardiac standpoint for discharge home.  Will need TOC in 7-10 days and FLP in 6 weeks.   For questions or updates, please contact [facility] Please consult [email or web address] for contact info under Cardiology/STEMI.      Signed, Armanda Magic, MD  03/26/2018, 8:26 AM

## 2018-03-26 NOTE — Progress Notes (Addendum)
In consultation with pharmacy, Marden Noble is contraindicated in the setting of his antiretroviral medications for HIV/AIDS. Patient was started on effient in the hospital, but insurance dneied this medication (per [employer]). In consultation with Dr. Eldridge Dace (interventionalist on call), we will load with 300 mg plavix today with 75 mg plavix daily starting tomorrow. I will send a message to our pharmacy team to try to get him switched to effient next week, as this is preferred in the setting of his second STEMI. If pre-authorization is obtained, please switch patient to 10 mg effient daily. He can begin his first dose of effient the next day after his last dose of plavix. If effient is not approved, patient may stay on ASA and plavix as discharged.   Roe Rutherford Ninoska Goswick, PA-C 03/26/2018, 10:17 AM [phone] [facility]

## 2018-03-28 ENCOUNTER — Telehealth (HOSPITAL_COMMUNITY): Payer: Self-pay

## 2018-03-28 ENCOUNTER — Telehealth: Payer: Self-pay

## 2018-03-28 NOTE — Telephone Encounter (Signed)
Per request from Micah Flesher, PA-c  I have done an Effient PA through covermymeds. (see discharge summary in the pts chart from 03/26/18).   Awaiting response.

## 2018-03-28 NOTE — Telephone Encounter (Signed)
New message    TCM 04/05/18 1115a Rodney Walker

## 2018-03-28 NOTE — Telephone Encounter (Signed)
Alonna Minium J routed conversation to You 1 hour ago (3:31 PM)    Alonna Minium J 1 hour ago (3:30 PM)      New message    TCM 04/05/18 1115a [facility]       Documentation     Herron, Lippi Everette [phone]  Berle Mull

## 2018-03-28 NOTE — Telephone Encounter (Signed)
Patients insurance is active and benefits verified through Medicare A/B - No co-pay, deductible amount of $185.00/$185.00 has been met, no out of pocket, 20% co-insurance, and no pre-authorization is required. Passport/reference [phone]  Patients insurance is active through Medicaid - No co-pay, no deductible, no out of pocket, no co-insurance, and no pre-authorization is required. Passport/reference [phone]  Patient called and is interested in the program. Explained scheduling process to patient. Patient stated he understands. Will contact patient for scheduling upon review by the RN Navigator.

## 2018-03-29 ENCOUNTER — Other Ambulatory Visit: Payer: Medicare Other

## 2018-03-29 NOTE — Telephone Encounter (Signed)
Patient contacted regarding discharge from [facility] on March 26, 2018.  Patient understands to follow up with provider Tereso Newcomer, PA-C on April 05, 2018 at 11:15A at [facility]. Patient understands discharge instructions? yes Patient understands medications and regiment? yes Patient understands to bring all medications to this visit? yes

## 2018-03-30 ENCOUNTER — Other Ambulatory Visit: Payer: Medicare Other | Admitting: *Deleted

## 2018-03-30 DIAGNOSIS — I2510 Atherosclerotic heart disease of native coronary artery without angina pectoris: Secondary | ICD-10-CM

## 2018-03-30 LAB — BASIC METABOLIC PANEL
BUN / CREAT RATIO: 11 (ref 9–20)
BUN: 12 mg/dL (ref 6–20)
CO2: 24 mmol/L (ref 20–29)
CREATININE: 1.07 mg/dL (ref 0.76–1.27)
Calcium: 9.8 mg/dL (ref 8.7–10.2)
Chloride: 99 mmol/L (ref 96–106)
GFR calc Af Amer: 102 mL/min/{1.73_m2} (ref >59)
GFR, EST NON AFRICAN AMERICAN: 88 mL/min/{1.73_m2} (ref >59)
GLUCOSE: 95 mg/dL (ref 65–99)
POTASSIUM: 4.9 mmol/L (ref 3.5–5.2)
SODIUM: 134 mmol/L (ref 134–144)

## 2018-03-31 NOTE — Telephone Encounter (Signed)
Prior authorization for patients EFFIENT is approved dates 03/28/2018-12/27/2018

## 2018-04-05 ENCOUNTER — Encounter: Payer: Self-pay | Admitting: Physician Assistant

## 2018-04-05 ENCOUNTER — Ambulatory Visit (INDEPENDENT_AMBULATORY_CARE_PROVIDER_SITE_OTHER): Payer: Medicare Other | Admitting: Physician Assistant

## 2018-04-05 VITALS — BP 102/60 | HR 81 | Ht 64.0 in | Wt 136.4 lb

## 2018-04-05 DIAGNOSIS — I2510 Atherosclerotic heart disease of native coronary artery without angina pectoris: Secondary | ICD-10-CM

## 2018-04-05 DIAGNOSIS — B20 Human immunodeficiency virus [HIV] disease: Secondary | ICD-10-CM

## 2018-04-05 DIAGNOSIS — I2111 ST elevation (STEMI) myocardial infarction involving right coronary artery: Secondary | ICD-10-CM

## 2018-04-05 DIAGNOSIS — I255 Ischemic cardiomyopathy: Secondary | ICD-10-CM

## 2018-04-05 DIAGNOSIS — E785 Hyperlipidemia, unspecified: Secondary | ICD-10-CM | POA: Diagnosis not present

## 2018-04-05 HISTORY — DX: Ischemic cardiomyopathy: I25.5

## 2018-04-05 MED ORDER — PRASUGREL HCL 10 MG PO TABS: 10.0000 mg | ORAL_TABLET | Freq: Every day | ORAL | 3 refills | Status: AC

## 2018-04-05 NOTE — Patient Instructions (Addendum)
Medication Instructions:  1. We want to change your Plavix (Clopidogrel) to Effient.  2. You can take your Plavix today.  3. Tomorrow, when you would normally take your Plavix, take Effient instead  -Your first dose of Effient will be 60 mg (6 tablets of 10 mg to =60mg )  -The next day, you will start Effient 10 mg Once daily   Labwork: In 4 weeks - Lipids and LFTs  Testing/Procedures: None   Follow-Up: Tereso Newcomer, PA-C in 4 weeks  Dr. Elease Hashimoto is 3 months   Any Other Special Instructions Will Be Listed Below (If Applicable). We will refer you to a dietician Please go ahead and start cardiac rehabilitation after they call you back.  If you need a refill on your cardiac medications before your next appointment, please call your pharmacy.

## 2018-04-05 NOTE — Progress Notes (Signed)
Cardiology Office Note:    Date:  04/05/2018   ID:  Rodney Walker, DOB 20-Oct-1981, MRN [id number]  PCP:  Hoy Register, MD  Cardiologist:  Kristeen Miss, MD   Referring MD: Hoy Register, MD   Chief Complaint  Patient presents with  . Hospitalization Follow-up    s/p MI >> PCI    History of Present Illness:    Rodney Walker is a 37 y.o. male with coronary artery disease, HIV, asthma.  He presented in 2010 with an anterior myocardial infarction which was ultimately treated medically.  He was noted to have significant thrombus (?dissection) and was treated with antiplatelets and antithrombotics.  Re-look catheterization demonstrated healing and no PCI was required.  He was lost to follow-up.  He was admitted 3/28-3/30 with an inferior ST elevation myocardial infarction.  He was taken emergently to the cardiac catheterization lab was noted to have a thrombotic occlusion of the proximal RCA.  This was treated with aspiration thrombectomy and a drug-eluting stent.  He had no significant disease in the LAD or left circumflex.  It was felt that long-term dual antiplatelet therapy should be considered.  He was initially placed on Ticagrelor.  However, this is contraindicated in the setting of antiretroviral medications for HIV.  He was initially switched to Prasugrel but this was not covered by his insurance.  He was ultimately switched to Clopidogrel.  Prior authorization has been submitted for Prasugrel.  Mr. Kennel returns for post hospital follow up.  He is here alone.  Since discharge, he is doing well.  He is still anxious about returning to work.  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  His employer is willing to work with him as far as returning to work is concerned.  He denies any chest pain, shortness of breath, syncope, paroxysmal nocturnal dyspnea, edema.    Prior CV studies:   The following studies were reviewed today:  Echo 03/25/18 Mild LVH, EF 40-45,  inferior/inferoseptal HK, trivial MR  Cardiac catheterization 03/24/18 LM irregularities LAD irregularities LCx irregularities; OM1 30  RCA proximal 100-thrombotic EF >65 PCI: Aspiration thrombectomy and 4 x 23 mm Sierra DES to the proximal RCA   Past Medical History:  Diagnosis Date  . Coronary artery disease    MI 2010 - dissection/plaque rupture in LAD tx with antiplt/antithrombotics >> relook cath with resolution (no PCI performed) // Inf STEMI 3/19: OM1 30; pRCA 100 >> DES // Echo 3/19:  mild LVH, EF 40-45, inf/inf-sept HK, trivial MR  . Eczema   . History of myocardial infarction 2010; 2019  . History of pleural empyema   . HIV infection (HCC)   . Hyperlipidemia   . Immune deficiency disorder (HCC)   . Ischemic cardiomyopathy 04/05/2018  . Moderate persistent asthma without complication 10/29/2016   Surgical Hx: The patient  has a past surgical history that includes Cardiac catheterization (06/24/2009); Cardiac catheterization (06/17/2009); US ECHOCARDIOGRAPHY (12/09/2009); Video bronchoscopy (Bilateral, 09/05/2014); Video bronchoscopy (Bilateral, 04/10/2015); Video assisted thoracoscopy (vats)/ lobectomy (Right, 04/23/2015); LEFT HEART CATH AND CORONARY ANGIOGRAPHY (N/A, 03/24/2018); Coronary/Graft Acute MI Revascularization (N/A, 03/24/2018); and CORONARY STENT INTERVENTION (N/A, 03/24/2018).   Current Medications: Current Meds  Medication Sig  . acetaminophen (TYLENOL) 500 MG tablet Take 500 mg by mouth as needed for mild pain or headache.  . albuterol (PROVENTIL HFA;VENTOLIN HFA) 108 (90 Base) MCG/ACT inhaler Inhale 4 puffs into the lungs every 4 (four) hours as needed for wheezing or shortness of breath.  Marland Kitchen aspirin 81 MG EC  tablet Take 1 tablet (81 mg total) by mouth daily.  Marland Kitchen atorvastatin (LIPITOR) 40 MG tablet Take 1 tablet (40 mg total) by mouth daily at 6 PM.  . bictegravir-emtricitabine-tenofovir AF (BIKTARVY) 50-200-25 MG TABS tablet Take 1 tablet daily by mouth.  . cetirizine  (ZYRTEC) 10 MG tablet Take 1 tablet (10 mg total) daily by mouth.  . darunavir-cobicistat (PREZCOBIX) 800-150 MG tablet Take 1 tablet daily with breakfast by mouth. Swallow whole. Do NOT crush, break or chew tablets. Take with food.  Marland Kitchen EPINEPHrine 0.3 mg/0.3 mL IJ SOAJ injection Inject 0.3 mLs (0.3 mg total) into the muscle once.  . metoprolol tartrate (LOPRESSOR) 25 MG tablet Take 0.5 tablets (12.5 mg total) by mouth 2 (two) times daily.  . nitroGLYCERIN (NITROSTAT) 0.4 MG SL tablet Place 1 tablet (0.4 mg total) under the tongue every 5 (five) minutes x 3 doses as needed for chest pain.  . [DISCONTINUED] clopidogrel (PLAVIX) 75 MG tablet Take 1 tablet (75 mg total) by mouth daily.     Allergies:   Patient has no known allergies.   Social History   Tobacco Use  . Smoking status: Former Smoker    Packs/day: 0.25    Years: 17.00    Pack years: 4.25    Types: Cigarettes    Last attempt to quit: 03/23/2015    Years since quitting: 3.0  . Smokeless tobacco: Never Used  Substance Use Topics  . Alcohol use: No    Alcohol/week: 0.0 oz  . Drug use: Yes    Frequency: 2.0 times per week    Types: Marijuana     Family Hx: The patient's family history includes Asthma in his brother and mother; Diabetes in his mother; Eczema in his mother; Liver cancer in his father.  ROS:   Please see the history of present illness.    ROS All other systems reviewed and are negative.   EKGs/Labs/Other Test Reviewed:    EKG:  EKG is  ordered today.  The ekg ordered today demonstrates normal sinus rhythm, heart rate 81, normal axis, anterior Q waves, QTC 425  Recent Labs: 03/24/2018: ALT 10; B Natriuretic Peptide 36.3; TSH 0.297 03/25/2018: Hemoglobin 12.1; Platelets 225 03/30/2018: BUN 12; Creatinine, Ser 1.07; Potassium 4.9; Sodium 134   Recent Lipid Panel Lab Results  Component Value Date/Time   CHOL 155 03/25/2018 05:19 AM   TRIG 102 03/25/2018 05:19 AM   HDL 40 (L) 03/25/2018 05:19 AM   CHOLHDL  3.9 03/25/2018 05:19 AM   LDLCALC 95 03/25/2018 05:19 AM    Physical Exam:    VS:  BP 102/60   Pulse 81   Ht 5\' 4"  (1.626 m)   Wt 136 lb 6.4 oz (61.9 kg)   BMI 23.41 kg/m     Wt Readings from Last 3 Encounters:  04/05/18 136 lb 6.4 oz (61.9 kg)  03/26/18 137 lb 5.6 oz (62.3 kg)  11/02/17 136 lb (61.7 kg)     Physical Exam  Constitutional: He is oriented to person, place, and time. He appears well-developed and well-nourished. No distress.  HENT:  Head: Normocephalic and atraumatic.  Neck: No JVD present.  Cardiovascular: Normal rate and regular rhythm.  No murmur heard. Pulmonary/Chest: Effort normal. He has no rales.  Abdominal: Soft.  Musculoskeletal: He exhibits no edema.  R wrist without hematoma or bruit  Neurological: He is alert and oriented to person, place, and time.  Skin: Skin is warm and dry.    ASSESSMENT & PLAN:    #  1.  Acute ST elevation myocardial infarction (STEMI) involving right coronary artery (HCC) History of myocardial infarction in 2010 with dissection of the LAD that was treated medically.  Relook catheterization demonstrated resolution and no PCI was needed.  He was recently admitted in March 2019 with inferior ST elevation myocardial infarction treated with a drug-eluting stent to the RCA.  He had no significant disease in the LAD or LCx.  He is doing well without recurrent angina.  We discussed the importance of dual platelet therapy for minimum of 12 months.  Given his history, he will likely need to remain on long-term dual antiplatelet therapy.  I also reviewed this with the patient.  I have encouraged him to pursue cardiac rehabilitation.  He was taken off of Ticagrelor due to interaction with antiretroviral therapy.  Prasugrel was preferred but the patient was discharged on Clopidogrel due to insurance coverage.  He has now been approved for Prasugrel.  I discussed with our pharmacist regarding +/- load with Prasugrel.  -DC Plavix  -Start  Prasugrel 60 mg x1, then 10 mg daily  -Continue aspirin, beta-blocker, statin  -Okay to start cardiac rehabilitation  #2.  Coronary artery disease involving native coronary artery of native heart without angina pectoris  He is concerned about his diet and would like to see a dietitian.  - Plan: Ambulatory referral to Nutrition and Diabetic Education  #3.  Ischemic cardiomyopathy EF 40-45 by echocardiogram.  He does not display any signs or symptoms of volume excess.  His blood pressure limits further titration of heart failure medications.  Continue current dose of metoprolol tartrate.  I will see him in 4 weeks.  At follow-up, if his blood pressure will allow, I will start on low-dose ACE inhibitor.  #4.  HIV infection, unspecified symptom status (HCC) Continue follow-up with infectious disease as planned.  #5.  Hyperlipidemia, unspecified hyperlipidemia type Continue statin therapy.  Obtain follow-up lipids and LFTs in 4 weeks.   Dispo:  Return in about 1 month (around 05/03/2018) for Close Follow Up, w/ Tereso Newcomer, PA-C.   Medication Adjustments/Labs and Tests Ordered: Current medicines are reviewed at length with the patient today.  Concerns regarding medicines are outlined above.  Tests Ordered: Orders Placed This Encounter  Procedures  . Lipid Profile  . Hepatic function panel  . Ambulatory referral to Nutrition and Diabetic Education  . EKG 12-Lead   Medication Changes: Meds ordered this encounter  Medications  . prasugrel (EFFIENT) 10 MG TABS tablet    Sig: Take 1 tablet (10 mg total) by mouth daily. First dose is 60 mg ; beginning the second day you will be on 10 mg daily    Dispense:  100 tablet    Refill:  3    Stop plavix    Signed, Tereso Newcomer, PA-C  04/05/2018 4:12 PM    [facility] [address] [place], [place], [place]  [address] Phone: [phone]; Fax: [phone]

## 2018-04-06 ENCOUNTER — Telehealth: Payer: Self-pay | Admitting: Physician Assistant

## 2018-04-06 NOTE — Telephone Encounter (Signed)
New Message   Pt c/o medication issue:  1. Name of Medication: prasugrel (EFFIENT) 10 MG TABS tablet  2. How are you currently taking this medication (dosage and times per day)? 10mg    3. Are you having a reaction (difficulty breathing--STAT)? None  4. What is your medication issue? Patient states that he was advised by the pharmacy that his prescription is not covered. Please call to discuss.

## 2018-04-06 NOTE — Telephone Encounter (Signed)
Pt has been notified to make sure to continue the Plavix until we can get the Effient approved. Effient has actually been approved see note from Prior Auth nurse, though insurance company is not saying it is approved. Pt aware once we have it all approved we will call him to let him know when to change to the Effient. Pt thanked me for my call.

## 2018-04-06 NOTE — Telephone Encounter (Signed)
I have lmom to continue on Plavix until we cannot get the Effient approved. Lmom to please call the office to go over recommendations.

## 2018-04-06 NOTE — Telephone Encounter (Signed)
Patient should remain on Plavix without interruption. Prior authorization needs to be redone.  If/when Effient approved, let me know and we can have him change to Effient then. If Effient cannot be approved, Mr. Germana should remain on Plavix. Tereso Newcomer, PA-C    04/06/2018 1:53 PM

## 2018-04-06 NOTE — Telephone Encounter (Signed)
Pt called earlier today to let us know that he went to pick up the Effient though he was told it has not yet been approved. Before I call the pt I called the [facility] who states they do not show that Effient is approved. I gave the approval as per Prior Auth nurse, showing Effient 10 mg was approved on 03/31/18; with approval dates of 03/28/18-12/27/18. Pharm tech advised to have Prior Auth nurse re-check with [employer]. She states the insurance company may not have updated their system yet. I will d/w Tereso Newcomer, PA fir further recommendations at this time as to neither the prior Auth nurses are in the office today. I will also route this message to Prior Auth nurse as well.

## 2018-04-08 ENCOUNTER — Telehealth (HOSPITAL_COMMUNITY): Payer: Self-pay

## 2018-04-08 NOTE — Telephone Encounter (Signed)
Per Rodney Walker at [facility] has been approved but the pharmacy keeps running Prasugrel. Rodney Walker recommended that I call the pts pharmacy and ask them to run it under Effient and it will go through.  I called [facility] and asked them to run the RX as Effient. They did and Effient is covered. I then called the pt and made him aware.

## 2018-04-08 NOTE — Telephone Encounter (Signed)
Called and spoke with patient in regards to Cardiac Rehab - Patient is interested in the program. Scheduled orientation on 06/09/18 at 8:30am. Patient will attend the 11:15am exc class. Went over insurance with patient and patient verbally stated he understands. Mailed packet.

## 2018-04-23 ENCOUNTER — Emergency Department (HOSPITAL_COMMUNITY)
Admission: EM | Admit: 2018-04-23 | Discharge: 2018-04-24 | Disposition: A | Discharge status: Home / Self Care | Payer: Medicare Other | Attending: Emergency Medicine | Admitting: Emergency Medicine

## 2018-04-23 ENCOUNTER — Other Ambulatory Visit: Payer: Self-pay

## 2018-04-23 ENCOUNTER — Encounter (HOSPITAL_COMMUNITY): Payer: Self-pay | Admitting: *Deleted

## 2018-04-23 DIAGNOSIS — Z79899 Other long term (current) drug therapy: Secondary | ICD-10-CM | POA: Insufficient documentation

## 2018-04-23 DIAGNOSIS — J4540 Moderate persistent asthma, uncomplicated: Secondary | ICD-10-CM | POA: Diagnosis not present

## 2018-04-23 DIAGNOSIS — Z7982 Long term (current) use of aspirin: Secondary | ICD-10-CM | POA: Diagnosis not present

## 2018-04-23 DIAGNOSIS — I2510 Atherosclerotic heart disease of native coronary artery without angina pectoris: Secondary | ICD-10-CM | POA: Diagnosis not present

## 2018-04-23 DIAGNOSIS — M25551 Pain in right hip: Secondary | ICD-10-CM

## 2018-04-23 DIAGNOSIS — Z87891 Personal history of nicotine dependence: Secondary | ICD-10-CM | POA: Insufficient documentation

## 2018-04-23 MED ORDER — ACETAMINOPHEN 325 MG PO TABS: 650.0000 mg | ORAL_TABLET | Freq: Four times a day (QID) | ORAL | 0 refills | Status: DC | PRN

## 2018-04-23 MED ORDER — DICLOFENAC SODIUM 1 % TD GEL: 2.0000 g | Freq: Four times a day (QID) | TRANSDERMAL | 0 refills | Status: DC

## 2018-04-23 MED ORDER — METHOCARBAMOL 500 MG PO TABS: 500.0000 mg | ORAL_TABLET | Freq: Every evening | ORAL | 0 refills | Status: DC | PRN

## 2018-04-23 NOTE — ED Provider Notes (Signed)
[facility] EMERGENCY DEPARTMENT Provider Note   CSN: [id number] Arrival date & time: 04/23/18  1735     History   Chief Complaint Chief Complaint  Patient presents with  . Hip Pain    HPI Zacharie Everette Rison is a 37 y.o. male  pressenting with vague symptoms of pain in the general right hip area since last night when he woke up, which he states seems like the same he had a month ago. He "still can't figure out exactly where the pain is", the more he thinks about it the more he feels the pain but can't describe exactly what is hurting. The pain is sharp and aggravated maybe twisting the the left, he is unsure. He experiences relief by applying pressure point to his right buttocks.  He has not tried anything for his symptoms other than stretching. Denies an injury or trauma, no weakness, numbness, dysuria, loss of bowel or bladder function fever chills or other symptoms. Pain does not radiate anywhere, he thinks that it may be hurting when he presses on his right lower back over the iliac crest. No testicular pain, swelling or groin pain. No pcp, he reports that since he has not been in in a long time, the "booted him off the system", but he will be reestablishing care. He is compliant with medications and still has access to medications. He was hospitalized a month ago after MI. He states that he has been recovering well and has been following up with his cardiologist. No chest pain, sob, palpitations, dizziness or other symptoms.   HPI  Past Medical History:  Diagnosis Date  . Coronary artery disease    MI 2010 - dissection/plaque rupture in LAD tx with antiplt/antithrombotics >> relook cath with resolution (no PCI performed) // Inf STEMI 3/19: OM1 30; pRCA 100 >> DES // Echo 3/19:  mild LVH, EF 40-45, inf/inf-sept HK, trivial MR  . Eczema   . History of myocardial infarction 2010; 2019  . History of pleural empyema   . HIV infection (HCC)   . Hyperlipidemia     . Immune deficiency disorder (HCC)   . Ischemic cardiomyopathy 04/05/2018  . Moderate persistent asthma without complication 10/29/2016    Patient Active Problem List   Diagnosis Date Noted  . Hyperlipidemia 04/05/2018  . Ischemic cardiomyopathy 04/05/2018  . History of acute inferior wall MI 03/24/2018  . Marijuana abuse, continuous 10/29/2016  . Moderate persistent asthma without complication 10/29/2016  . Allergic rhinitis 10/08/2016  . Anemia 05/03/2015  . Lung abscess (HCC) 04/22/2015  . Sepsis due to pneumonia (HCC) 04/17/2015  . Bullous emphysema (HCC)   . Lobar pneumonia (HCC)   . Empyema lung (HCC) 04/16/2015  . HIV (human immunodeficiency virus infection) (HCC)   . Tobacco abuse 03/21/2015  . Lung mass 08/31/2014  . 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 Myalgia 04/23/2014  . AIDS (HCC) 03/22/2014  . CAP (community acquired pneumonia) 03/22/2014  . CAD (coronary artery disease) 11/13/2011    Past Surgical History:  Procedure Laterality Date  . CARDIAC CATHETERIZATION  06/24/2009   EF 60%  . CARDIAC CATHETERIZATION  06/17/2009   EF 45%. ANTERIOR HYPOKINESIS  . CORONARY STENT INTERVENTION N/A 03/24/2018   Procedure: CORONARY STENT INTERVENTION;  Surgeon: Tonny Bollman, MD;  Location: [facility] INVASIVE CV LAB;  Service: Cardiovascular;  Laterality: N/A;  . CORONARY/GRAFT ACUTE MI REVASCULARIZATION N/A 03/24/2018   Procedure: Coronary/Graft Acute MI Revascularization;  Surgeon: Tonny Bollman, MD;  Location: [facility] INVASIVE CV LAB;  Service: Cardiovascular;  Laterality: N/A;  . LEFT HEART CATH AND CORONARY ANGIOGRAPHY N/A 03/24/2018   Procedure: LEFT HEART CATH AND CORONARY ANGIOGRAPHY;  Surgeon: Tonny Bollman, MD;  Location: [facility] INVASIVE CV LAB;  Service: Cardiovascular;  Laterality: N/A;  . US ECHOCARDIOGRAPHY  12/09/2009   EF 55-60%  . VIDEO ASSISTED THORACOSCOPY (VATS)/ LOBECTOMY Right 04/23/2015   Procedure: VIDEO ASSISTED THORACOSCOPY (VATS)/RIGHT upper  and right middle  LOBECTOMY;  Surgeon: Kerin Perna, MD;  Location: [facility] OR;  Service: Thoracic;  Laterality: Right;  Marland Kitchen VIDEO BRONCHOSCOPY Bilateral 09/05/2014   Procedure: VIDEO BRONCHOSCOPY WITH FLUORO;  Surgeon: Merwyn Katos, MD;  Location: [facility] ENDOSCOPY;  Service: Cardiopulmonary;  Laterality: Bilateral;  . VIDEO BRONCHOSCOPY Bilateral 04/10/2015   Procedure: VIDEO BRONCHOSCOPY WITH FLUORO;  Surgeon: Lupita Leash, MD;  Location: [facility] ENDOSCOPY;  Service: Cardiopulmonary;  Laterality: Bilateral;        Home Medications    Prior to Admission medications   Medication Sig Start Date End Date Taking? Authorizing Provider  acetaminophen (TYLENOL) 325 MG tablet Take 2 tablets (650 mg total) by mouth every 6 (six) hours as needed for mild pain or moderate pain. 04/23/18   Mathews Robinsons B, PA-C  albuterol (PROVENTIL HFA;VENTOLIN HFA) 108 (90 Base) MCG/ACT inhaler Inhale 4 puffs into the lungs every 4 (four) hours as needed for wheezing or shortness of breath. 10/29/16   Alfonse Spruce, MD  aspirin 81 MG EC tablet Take 1 tablet (81 mg total) by mouth daily. 03/26/18   [facility]uke, Roe Rutherford, PA  atorvastatin (LIPITOR) 40 MG tablet Take 1 tablet (40 mg total) by mouth daily at 6 PM. 03/26/18   [facility], Roe Rutherford, PA  bictegravir-emtricitabine-tenofovir AF (BIKTARVY) 50-200-25 MG TABS tablet Take 1 tablet daily by mouth. 11/03/17   Judyann Munson, MD  cetirizine (ZYRTEC) 10 MG tablet Take 1 tablet (10 mg total) daily by mouth. 11/02/17   Judyann Munson, MD  darunavir-cobicistat (PREZCOBIX) 800-150 MG tablet Take 1 tablet daily with breakfast by mouth. Swallow whole. Do NOT crush, break or chew tablets. Take with food. 11/03/17   Judyann Munson, MD  diclofenac sodium (VOLTAREN) 1 % GEL Apply 2 g topically 4 (four) times daily. 04/23/18   Mathews Robinsons B, PA-C  EPINEPHrine 0.3 mg/0.3 mL IJ SOAJ injection Inject 0.3 mLs (0.3 mg total) into the muscle once. 10/29/16   Alfonse Spruce, MD  methocarbamol  (ROBAXIN) 500 MG tablet Take 1 tablet (500 mg total) by mouth at bedtime as needed for muscle spasms. 04/23/18   Mathews Robinsons B, PA-C  metoprolol tartrate (LOPRESSOR) 25 MG tablet Take 0.5 tablets (12.5 mg total) by mouth 2 (two) times daily. 03/26/18   [facility], Roe Rutherford, PA  nitroGLYCERIN (NITROSTAT) 0.4 MG SL tablet Place 1 tablet (0.4 mg total) under the tongue every 5 (five) minutes x 3 doses as needed for chest pain. 03/26/18   [facility], Roe Rutherford, PA  prasugrel (EFFIENT) 10 MG TABS tablet Take 1 tablet (10 mg total) by mouth daily. First dose is 60 mg ; beginning the second day you will be on 10 mg daily 04/05/18 07/04/18  Beatrice Lecher, PA-C    Family History Family History  Problem Relation Age of Onset  . Diabetes Mother   . Eczema Mother   . Asthma Mother   . Liver cancer Father   . Asthma Brother     Social History Social History   Tobacco Use  . Smoking status: Former Smoker    Packs/day:  0.25    Years: 17.00    Pack years: 4.25    Types: Cigarettes    Last attempt to quit: 03/23/2015    Years since quitting: 3.0  . Smokeless tobacco: Never Used  Substance Use Topics  . Alcohol use: No    Alcohol/week: 0.0 oz  . Drug use: Yes    Frequency: 2.0 times per week    Types: Marijuana     Allergies   Patient has no known allergies.   Review of Systems Review of Systems  Constitutional: Negative for chills, diaphoresis, fatigue and fever.  Respiratory: Negative for cough, choking, chest tightness, shortness of breath, wheezing and stridor.   Cardiovascular: Negative for chest pain, palpitations and leg swelling.  Gastrointestinal: Negative for abdominal pain, nausea and vomiting.  Genitourinary: Negative for difficulty urinating, dysuria, frequency, scrotal swelling and testicular pain.  Musculoskeletal: Positive for arthralgias and back pain. Negative for gait problem, joint swelling and neck stiffness.  Skin: Negative for color change, pallor and rash.    Neurological: Negative for dizziness, tremors, weakness, light-headedness and numbness.     Physical Exam Updated Vital Signs BP 110/67 (BP Location: Right Arm)   Pulse 73   Temp 98.9 F (37.2 C) (Oral)   Resp 16   Ht 5\' 4"  (1.626 m)   Wt 59 kg (130 lb)   SpO2 100%   BMI 22.31 kg/m   Physical Exam  Constitutional: He appears well-developed and well-nourished. No distress.  Afebrile, nontoxic, ling comfortably in bed sleeping, no acute distress.  HENT:  Head: Normocephalic and atraumatic.  Eyes: Conjunctivae are normal.  Neck: Normal range of motion. Neck supple.  Cardiovascular: Normal rate, regular rhythm, normal heart sounds and intact distal pulses.  No murmur heard. Pulmonary/Chest: Effort normal and breath sounds normal. No stridor. No respiratory distress. He has no wheezes. He has no rales.  Musculoskeletal: Normal range of motion. He exhibits no edema, tenderness or deformity.  Pain is not reproducible on palpation and patient reports relief with deep palpation of the right gluteal muscle.  Neurological: He is alert. Abnormal reflex:  No sensory deficit. He exhibits normal muscle tone.  Patient is ambulating without difficulty.  Skin: Skin is warm and dry. No rash noted. He is not diaphoretic. No erythema. No pallor.  Psychiatric: He has a normal mood and affect.  Nursing note and vitals reviewed.    ED Treatments / Results  Labs (all labs ordered are listed, but only abnormal results are displayed) Labs Reviewed - No data to display  EKG None  Radiology No results found.  Procedures Procedures (including critical care time)  Medications Ordered in ED Medications - No data to display   Initial Impression / Assessment and Plan / ED Course  I have reviewed the triage vital signs and the nursing notes.  Pertinent labs & imaging results that were available during my care of the patient were reviewed by me and considered in my medical decision making  (see chart for details).     Patient presenting with vague symptoms of intermittent sharp right hip pain which started again last night. He is ambulatory without difficulties and has not taken anything for his symptoms.  He has had a similar pain back a month ago and attributed to a lot of walking and exercising and has been eventually went away.  He denies any systemic symptoms and states that he has been feeling well otherwise.  He denies any injury or trauma.   Will discharge patient  home with symptomatic relief and close follow-up with PCP in a week if symptoms persist.  Well-appearing nontoxic afebrile.  No imaging indicated at this time.  Return precautions discussed and patient understood and agreed with plan.  Final Clinical Impressions(s) / ED Diagnoses   Final diagnoses:  Right hip pain    ED Discharge Orders        Ordered    acetaminophen (TYLENOL) 325 MG tablet  Every 6 hours PRN     04/23/18 1944    methocarbamol (ROBAXIN) 500 MG tablet  At bedtime PRN     04/23/18 1944    diclofenac sodium (VOLTAREN) 1 % GEL  4 times daily     04/23/18 1944       Gregary Cromer 04/23/18 1952    Doug Sou, MD 04/24/18 [phone]

## 2018-04-23 NOTE — Discharge Instructions (Signed)
As discussed, The medicine prescribed can help with muscle spasm but cannot be taken if driving, with alcohol or operating machinery. Massage, warm compresses and gentle stretching.  Follow up with your Primary care provider if symptoms  persist beyond a week.  Return if worsening or new concerning symptoms in the meantime.

## 2018-04-23 NOTE — ED Triage Notes (Signed)
Rt hip pain for years this episode has been for one weedk  He does not remember what caused it originally

## 2018-04-27 ENCOUNTER — Encounter: Payer: Self-pay | Admitting: Registered"

## 2018-04-27 ENCOUNTER — Encounter: Payer: Medicare Other | Attending: Physician Assistant | Admitting: Registered"

## 2018-04-27 DIAGNOSIS — B20 Human immunodeficiency virus [HIV] disease: Secondary | ICD-10-CM | POA: Diagnosis not present

## 2018-04-27 DIAGNOSIS — Z713 Dietary counseling and surveillance: Secondary | ICD-10-CM | POA: Insufficient documentation

## 2018-04-27 DIAGNOSIS — J45909 Unspecified asthma, uncomplicated: Secondary | ICD-10-CM | POA: Insufficient documentation

## 2018-04-27 DIAGNOSIS — I2111 ST elevation (STEMI) myocardial infarction involving right coronary artery: Secondary | ICD-10-CM | POA: Insufficient documentation

## 2018-04-27 DIAGNOSIS — I2510 Atherosclerotic heart disease of native coronary artery without angina pectoris: Secondary | ICD-10-CM

## 2018-04-27 NOTE — Progress Notes (Signed)
Medical Nutrition Therapy:  Appt start time: 1010 end time:  1115.   Assessment:  Primary concerns today: Pt referred due to heart attack. Pt has been dx with CAD, HIV, and asthma. Pt present for appointment. Pt attended first portion of appointment alone. He reports that he would like for guest he brought waiting in lobby to attend the education portion of the appointment, but does not want them to know about his medical information, specifically his dx of HIV. He reports he would like for guest to be present for education portion as guest will be assisting pt with purchasing and/or planning meals. Pt brought his guest into second half of appointment which included the nutrition education information, after discussion of medical information was completed. Pt reports that he is used to salty foods and has been struggling with trying to limit sodium intake. He reports that it has been hard for him to eat much of the lower sodium foods he has been preparing with sodium free seasonings. He is concerned about not liking these foods as he reports he does not want to lose weight as he knows he does not need to. Pt reports that he has now had 2 heart attacks and would like information on how to eat healthier for heart health. Pt denies any appetite or GI problems or concerns. Pt reports that he lost a little weight following his last hospital stay due to not liking the food offered because he was on a heart healthy diet. Pt reports financial concerns regarding purchasing healthier foods. 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   Weight Hx: 04/27/18: 132 lb 04/05/18: 136 lb 09/29/17: 138 lb  04/27/17: 134 lb  04/21/16: 132.5 lb   Preferred Learning Style:  No preference indicated   Learning Readiness:   Ready  MEDICATIONS: See list.    DIETARY INTAKE:  Usual eating pattern includes 2 meals and 1 snacks per day. Pt reports sometimes eating unsalted nuts with dried cranberries as a snack.    Everyday foods include rice.  Avoided foods include hamburgers (pt reports that after eating hamburgers they make him feel bloated and he has concerns about number of calories).    24-hr recall:  B (9 AM): bowl of frosted flakes cereal, whole milk  Snk ( AM):  None reported.  L ( PM): None reported.  Snk ( PM): None reported.  D (7 PM): baked chicken wings (8), lemonade Snk ( PM): None reported.  Beverages: around 3 cups lemonade; milk in cereal  Usual physical activity: 45 minute walks depending on weather. Sometimes jogs.   Estimated energy needs: ~2400-2600 calories 270-423 g carbohydrates 48 g protein 53-101 g fat  Progress Towards Goal(s):  In progress.   Nutritional Diagnosis:  NB-1.1 Food and nutrition-related knowledge deficit As related to heart healthy nutrition therapy .  As evidenced by pt unsure how to eat to promote heart health . NI-5.11.1 Predicted suboptimal nutrient intake As related to skipping meals .  As evidenced by pt reports he typically only eats 2 meals per day .    Intervention:  Nutrition counseling provided. Dietitian provided education regarding balanced and heart healthy nutrition. Discussed importance of getting in 3 meals per day to provide adequate calories and nutrients and encouraged having a snack in between each meal, especially if unable to eat as much at meals. Discussed how to include high calorie foods that are also heart healthy. Discussed how to assess sodium and saturated fat amount by  viewing nutrition label. Encouraged pt to continue with walking regularly. Pt appeared agreeable to information/goals discussed.   Instructions/Goals:  Make sure to get in three meals per day. Try to have balanced meals like the My Plate example (see handout).  Goal: Have at least 3 meals per day and recommend a snack in between meals.   Goal: Include heart healthy fats more often and saturated fats less often (see handout). Try to limit foods high in  sodium (those with around 20% or more DV).   Resource to help with low sodium recipes:   https://whatscooking.AdDates.cz  Teaching Method Utilized:  Visual Auditory  Handouts given during visit include:  Balanced plate with food list.   Heart Healthy Nutrition  Sodium Free Seasonings   Barriers to learning/adherence to lifestyle change: Pt has concerns regarding price of healthier foods.   Demonstrated degree of understanding via:  Teach Back   Monitoring/Evaluation:  Dietary intake, exercise, and body weight in 1 month(s).

## 2018-04-27 NOTE — Patient Instructions (Addendum)
Instructions/Goals:  Make sure to get in three meals per day. Try to have balanced meals like the My Plate example (see handout).  Goal: Have at least 3 meals per day and recommend a snack in between meals.   Goal: Include heart healthy fats more often and saturated fats less often (see handout). Try to limit foods high in sodium (those with around 20% or more DV).   Resource to help with low sodium recipes:   https://whatscooking.AdDates.cz

## 2018-05-04 ENCOUNTER — Other Ambulatory Visit: Payer: Medicare Other

## 2018-05-04 ENCOUNTER — Ambulatory Visit (INDEPENDENT_AMBULATORY_CARE_PROVIDER_SITE_OTHER): Payer: Medicare Other | Admitting: Physician Assistant

## 2018-05-04 ENCOUNTER — Encounter: Payer: Self-pay | Admitting: Physician Assistant

## 2018-05-04 ENCOUNTER — Other Ambulatory Visit: Payer: Self-pay | Admitting: Internal Medicine

## 2018-05-04 VITALS — BP 118/70 | HR 60 | Ht 64.0 in | Wt 134.8 lb

## 2018-05-04 DIAGNOSIS — I255 Ischemic cardiomyopathy: Secondary | ICD-10-CM | POA: Diagnosis not present

## 2018-05-04 DIAGNOSIS — I2510 Atherosclerotic heart disease of native coronary artery without angina pectoris: Secondary | ICD-10-CM

## 2018-05-04 DIAGNOSIS — M25551 Pain in right hip: Secondary | ICD-10-CM | POA: Diagnosis not present

## 2018-05-04 DIAGNOSIS — E785 Hyperlipidemia, unspecified: Secondary | ICD-10-CM

## 2018-05-04 MED ORDER — LISINOPRIL 2.5 MG PO TABS: 2.5000 mg | ORAL_TABLET | Freq: Every day | ORAL | 3 refills | Status: DC

## 2018-05-04 NOTE — Progress Notes (Signed)
Cardiology Office Note:    Date:  05/04/2018   ID:  Rodney Walker, DOB [birth year], MRN [id number]  PCP:  Hoy Register, MD  Cardiologist:  Kristeen Miss, MD   Referring MD: Hoy Register, MD   Chief Complaint  Patient presents with  . Follow-up    CAD    History of Present Illness:    Rodney Walker is a 37 y.o. male with coronary artery disease, HIV, asthma.  He suffered an anterior MI in 2010 with significant thrombus burden noted, ultimately treated medically with antiplatelets, antithrombotics.  He was recently admitted March 2019 with inferior ST elevation myocardial infarction treated with aspiration thrombectomy and DES to the proximal RCA.  Prasugrel was recommended in addition to aspirin (secondary to interactions with antiretroviral therapy and ticagrelor).  Long-term dual antiplatelet therapy was recommended given his prior history.  He was last seen 04/05/2018.   Rodney Walker returns for follow-up.  He is here alone.  Since last seen, he has done well without chest pain, shortness of breath, syncope, PND or edema.  He has had some right hip pain without apparent injury.  He did go the emergency room and was given some medications to take.  He is not having any symptoms of claudication.  He wants to attend cardiac rehabilitation as well as good nutritional classes to improve his diet.  However, he is worried about missing work while attending these sessions.  Prior CV studies:   The following studies were reviewed today:  Echo 03/25/18 Mild LVH, EF 40-45, inferior/inferoseptal HK, trivial MR  Cardiac catheterization 03/24/18 LM irregularities LAD irregularities LCx irregularities; OM1 30  RCA proximal 100-thrombotic EF >65 PCI: Aspiration thrombectomy and 4 x 23 mm Sierra DES to the proximal RCA  Past Medical History:  Diagnosis Date  . COPD (chronic obstructive pulmonary disease) (HCC)   . Coronary artery disease    MI 2010 - dissection/plaque  rupture in LAD tx with antiplt/antithrombotics >> relook cath with resolution (no PCI performed) // Inf STEMI 3/19: OM1 30; pRCA 100 >> DES // Echo 3/19:  mild LVH, EF 40-45, inf/inf-sept HK, trivial MR  . Eczema   . History of myocardial infarction 2010; 2019  . History of pleural empyema   . HIV infection (HCC)   . Hyperlipidemia   . Immune deficiency disorder (HCC)   . Ischemic cardiomyopathy 04/05/2018  . Moderate persistent asthma without complication 10/29/2016   Surgical Hx: The patient  has a past surgical history that includes Cardiac catheterization (06/24/2009); Cardiac catheterization (06/17/2009); US ECHOCARDIOGRAPHY (12/09/2009); Video bronchoscopy (Bilateral, 09/05/2014); Video bronchoscopy (Bilateral, 04/10/2015); Video assisted thoracoscopy (vats)/ lobectomy (Right, 04/23/2015); LEFT HEART CATH AND CORONARY ANGIOGRAPHY (N/A, 03/24/2018); Coronary/Graft Acute MI Revascularization (N/A, 03/24/2018); and CORONARY STENT INTERVENTION (N/A, 03/24/2018).   Current Medications: Current Meds  Medication Sig  . albuterol (PROVENTIL HFA;VENTOLIN HFA) 108 (90 Base) MCG/ACT inhaler Inhale 4 puffs into the lungs every 4 (four) hours as needed for wheezing or shortness of breath.  Marland Kitchen aspirin 81 MG EC tablet Take 1 tablet (81 mg total) by mouth daily.  Marland Kitchen atorvastatin (LIPITOR) 40 MG tablet Take 1 tablet (40 mg total) by mouth daily at 6 PM.  . bictegravir-emtricitabine-tenofovir AF (BIKTARVY) 50-200-25 MG TABS tablet Take 1 tablet daily by mouth.  . cetirizine (ZYRTEC) 10 MG tablet Take 1 tablet (10 mg total) daily by mouth.  . darunavir-cobicistat (PREZCOBIX) 800-150 MG tablet Take 1 tablet daily with breakfast by mouth. Swallow whole. Do NOT crush, break or chew  tablets. Take with food.  . diclofenac sodium (VOLTAREN) 1 % GEL Apply 2 g topically 4 (four) times daily.  . methocarbamol (ROBAXIN) 500 MG tablet Take 1 tablet (500 mg total) by mouth at bedtime as needed for muscle spasms.  . metoprolol  tartrate (LOPRESSOR) 25 MG tablet Take 0.5 tablets (12.5 mg total) by mouth 2 (two) times daily.  . nitroGLYCERIN (NITROSTAT) 0.4 MG SL tablet Place 1 tablet (0.4 mg total) under the tongue every 5 (five) minutes x 3 doses as needed for chest pain.  . prasugrel (EFFIENT) 10 MG TABS tablet Take 1 tablet (10 mg total) by mouth daily. First dose is 60 mg ; beginning the second day you will be on 10 mg daily     Allergies:   Patient has no known allergies.   Social History   Tobacco Use  . Smoking status: Former Smoker    Packs/day: 0.25    Years: 17.00    Pack years: 4.25    Types: Cigarettes    Last attempt to quit: 03/23/2015    Years since quitting: 3.1  . Smokeless tobacco: Never Used  Substance Use Topics  . Alcohol use: No    Alcohol/week: 0.0 oz  . Drug use: Yes    Frequency: 2.0 times per week    Types: Marijuana     Family Hx: The patient's family history includes Asthma in his brother and mother; COPD in his mother; Diabetes in his mother; Eczema in his mother; Heart disease in his mother; Hyperlipidemia in his mother; Hypertension in his mother; Liver cancer in his father; Sleep apnea in his mother; Stroke in his mother.  ROS:   Please see the history of present illness.    Review of Systems  Musculoskeletal: Positive for joint pain.   All other systems reviewed and are negative.   EKGs/Labs/Other Test Reviewed:    EKG:  EKG is not ordered today.    Recent Labs: 03/24/2018: ALT 10; B Natriuretic Peptide 36.3; TSH 0.297 03/25/2018: Hemoglobin 12.1; Platelets 225 03/30/2018: BUN 12; Creatinine, Ser 1.07; Potassium 4.9; Sodium 134   Recent Lipid Panel Lab Results  Component Value Date/Time   CHOL 155 03/25/2018 05:19 AM   TRIG 102 03/25/2018 05:19 AM   HDL 40 (L) 03/25/2018 05:19 AM   CHOLHDL 3.9 03/25/2018 05:19 AM   LDLCALC 95 03/25/2018 05:19 AM    Physical Exam:    VS:  BP 118/70 (BP Location: Right Arm, Patient Position: Sitting, Cuff Size: Normal)    Pulse 60   Ht 5\' 4"  (1.626 m)   Wt 134 lb 12.8 oz (61.1 kg)   SpO2 98%   BMI 23.14 kg/m     Wt Readings from Last 3 Encounters:  05/04/18 134 lb 12.8 oz (61.1 kg)  04/27/18 132 lb 3.2 oz (60 kg)  04/23/18 130 lb (59 kg)     Physical Exam  Constitutional: He is oriented to person, place, and time. He appears well-developed and well-nourished. No distress.  HENT:  Head: Normocephalic and atraumatic.  Neck: Neck supple. No JVD present.  Cardiovascular: Normal rate, regular rhythm, S1 normal and S2 normal.  No murmur heard. Pulmonary/Chest: Breath sounds normal. He has no rales.  Abdominal: Soft. There is no hepatomegaly.  Musculoskeletal: He exhibits no edema.  Neurological: He is alert and oriented to person, place, and time.  Skin: Skin is warm and dry.    ASSESSMENT & PLAN:    Coronary artery disease involving native coronary artery of native  heart without angina pectoris History of anterior MI in 2010 with dissection of the LAD treated medically.  Recent inferior ST elevation microinfarction March 2019 treated with drug-eluting stent to the RCA.  He is doing well without angina.  Continue aspirin, Prasugrel, Atorvastatin, Metoprolol tartrate.  As noted, he plans to attend cardiac rehabilitation.  I have provided him with a letter today to help with getting time off to attend cardiac rehab as well as to go to nutritional classes.  Ischemic cardiomyopathy EF 40-45 by echocardiogram at the time of his myocardial infarction.  He does not display any signs or symptoms of congestive heart failure.  Continue low-dose beta-blocker.  I have recommended that we start low-dose ACE inhibitor.  -Lisinopril 2.5 mg daily  -BMET 2 weeks   Hyperlipidemia, unspecified hyperlipidemia type Continue moderate intensity statin.  Lipids and LFTs will be obtained today.   Hip Pain I have asked him to discuss with ID (he tells me Dr. Ilsa Iha usually takes care of his primary care issues) at [facility]  tomorrow.  I am not certain if any of his HIV medications can cause joint pain.   Dispo:  Return in about 6 months (around 11/04/2018) for Routine Follow Up, w/ Dr. Elease Hashimoto, or Tereso Newcomer, PA-C.   Medication Adjustments/Labs and Tests Ordered: Current medicines are reviewed at length with the patient today.  Concerns regarding medicines are outlined above.  Tests Ordered: Orders Placed This Encounter  Procedures  . Basic Metabolic Panel (BMET)   Medication Changes: Meds ordered this encounter  Medications  . lisinopril (PRINIVIL,ZESTRIL) 2.5 MG tablet    Sig: Take 1 tablet (2.5 mg total) by mouth daily.    Dispense:  90 tablet    Refill:  3    Signed, Tereso Newcomer, PA-C  05/04/2018 1:53 PM    [facility] [address] [place], [place], [place]  [address] Phone: [phone]; Fax: [phone]

## 2018-05-04 NOTE — Patient Instructions (Signed)
Medication Instructions:  1. Start Lisinopril 2.5 mg Daily. RX sent to [facility] on [address].  2. Continue all other medications prescribed.  Labwork: BMET: to be done in 2 weeks  Testing/Procedures: None ordered today  Follow-Up: Your physician wants you to follow-up in: 6 months with Dr. Elease Hashimoto or Tereso Newcomer, [facility]. You will receive a reminder letter in the mail two months in advance. If you don't receive a letter, please call our office to schedule the follow-up appointment.   Any Other Special Instructions Will Be Listed Below (If Applicable).     If you need a refill on your cardiac medications before your next appointment, please call your pharmacy.

## 2018-05-05 ENCOUNTER — Encounter: Payer: Self-pay | Admitting: Internal Medicine

## 2018-05-05 ENCOUNTER — Ambulatory Visit
Admission: RE | Admit: 2018-05-05 | Discharge: 2018-05-05 | Disposition: A | Discharge status: Home / Self Care | Payer: Medicare Other | Source: Ambulatory Visit | Attending: Internal Medicine | Admitting: Internal Medicine

## 2018-05-05 ENCOUNTER — Ambulatory Visit (INDEPENDENT_AMBULATORY_CARE_PROVIDER_SITE_OTHER): Payer: Medicare Other | Admitting: Internal Medicine

## 2018-05-05 VITALS — BP 101/61 | HR 82 | Temp 98.0°F | Ht 65.0 in | Wt 134.8 lb

## 2018-05-05 DIAGNOSIS — M25551 Pain in right hip: Secondary | ICD-10-CM

## 2018-05-05 DIAGNOSIS — I255 Ischemic cardiomyopathy: Secondary | ICD-10-CM | POA: Diagnosis not present

## 2018-05-05 DIAGNOSIS — Z79899 Other long term (current) drug therapy: Secondary | ICD-10-CM | POA: Diagnosis not present

## 2018-05-05 DIAGNOSIS — B20 Human immunodeficiency virus [HIV] disease: Secondary | ICD-10-CM | POA: Diagnosis not present

## 2018-05-05 DIAGNOSIS — Z23 Encounter for immunization: Secondary | ICD-10-CM

## 2018-05-05 NOTE — Progress Notes (Signed)
Patient ID: Rodney Walker, male   DOB: [birth year], 37 y.o.   MRN: [id number]  HPI Rodney Walker is a 37yo M with HIV disease, CD 4 count of 620VL< 48 copies in Winter 2018, on biktarvy-DRVc Has hx of CAD but was hospitalized in March 2019 for inferior STEMI s/p DES. Starting cardiac rehab in a few weeks. He maintains to be compliant. Has stopped smoking. For hte last 2-4 months, having constant right hip pain. No hx of trauma  Outpatient Encounter Medications as of 05/05/2018  Medication Sig  . albuterol (PROVENTIL HFA;VENTOLIN HFA) 108 (90 Base) MCG/ACT inhaler Inhale 4 puffs into the lungs every 4 (four) hours as needed for wheezing or shortness of breath.  Marland Kitchen aspirin 81 MG EC tablet Take 1 tablet (81 mg total) by mouth daily.  Marland Kitchen atorvastatin (LIPITOR) 40 MG tablet Take 1 tablet (40 mg total) by mouth daily at 6 PM.  . bictegravir-emtricitabine-tenofovir AF (BIKTARVY) 50-200-25 MG TABS tablet Take 1 tablet daily by mouth.  . cetirizine (ZYRTEC) 10 MG tablet Take 1 tablet (10 mg total) daily by mouth.  . darunavir-cobicistat (PREZCOBIX) 800-150 MG tablet Take 1 tablet daily with breakfast by mouth. Swallow whole. Do NOT crush, break or chew tablets. Take with food.  . diclofenac sodium (VOLTAREN) 1 % GEL Apply 2 g topically 4 (four) times daily.  Marland Kitchen lisinopril (PRINIVIL,ZESTRIL) 2.5 MG tablet Take 1 tablet (2.5 mg total) by mouth daily.  . methocarbamol (ROBAXIN) 500 MG tablet Take 1 tablet (500 mg total) by mouth at bedtime as needed for muscle spasms.  . metoprolol tartrate (LOPRESSOR) 25 MG tablet Take 0.5 tablets (12.5 mg total) by mouth 2 (two) times daily.  . nitroGLYCERIN (NITROSTAT) 0.4 MG SL tablet Place 1 tablet (0.4 mg total) under the tongue every 5 (five) minutes x 3 doses as needed for chest pain.  . prasugrel (EFFIENT) 10 MG TABS tablet Take 1 tablet (10 mg total) by mouth daily. First dose is 60 mg ; beginning the second day you will be on 10 mg daily   No  facility-administered encounter medications on file as of 05/05/2018.      Patient Active Problem List   Diagnosis Date Noted  . Hyperlipidemia 04/05/2018  . Ischemic cardiomyopathy 04/05/2018  . History of acute inferior wall MI 03/24/2018  . Marijuana abuse, continuous 10/29/2016  . Moderate persistent asthma without complication 10/29/2016  . Allergic rhinitis 10/08/2016  . Anemia 05/03/2015  . Lung abscess (HCC) 04/22/2015  . Sepsis due to pneumonia (HCC) 04/17/2015  . Bullous emphysema (HCC)   . Lobar pneumonia (HCC)   . Empyema lung (HCC) 04/16/2015  . HIV (human immunodeficiency virus infection) (HCC)   . Tobacco abuse 03/21/2015  . Lung mass 08/31/2014  . 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 Myalgia 04/23/2014  . AIDS (HCC) 03/22/2014  . CAP (community acquired pneumonia) 03/22/2014  . CAD (coronary artery disease) 11/13/2011   Sochx: stopped smoking  There are no preventive care reminders to display for this patient.   Review of Systems  Constitutional: Negative for fever, chills, diaphoresis, activity change, appetite change, fatigue and unexpected weight change.  HENT: Negative for congestion, sore throat, rhinorrhea, sneezing, trouble swallowing and sinus pressure.  Eyes: Negative for photophobia and visual disturbance.  Respiratory: Negative for cough, chest tightness, shortness of breath, wheezing and stridor.  Cardiovascular: Negative for chest pain, palpitations and leg swelling.  Gastrointestinal: Negative for nausea, vomiting, abdominal pain, diarrhea, constipation, blood in stool, abdominal distention and anal bleeding.  Genitourinary: Negative for dysuria, hematuria, flank pain and difficulty urinating.  Musculoskeletal: + right hip pain ,with difficulty with ambutlation Skin: Negative for color change, pallor, rash and wound.  Neurological: Negative for dizziness, tremors, weakness and light-headedness.  Hematological: Negative for adenopathy. Does not  bruise/bleed easily.  Psychiatric/Behavioral: Negative for behavioral problems, confusion, sleep disturbance, dysphoric mood, decreased concentration and agitation.    Physical Exam   BP 101/61   Pulse 82   Temp 98 F (36.7 C)   Ht 5\' 5"  (1.651 m)   Wt 134 lb 12.8 oz (61.1 kg)   SpO2 97%   BMI 22.43 kg/m   Physical Exam  Constitutional: He is oriented to person, place, and time. He appears well-developed and well-nourished. No distress.  HENT:  Mouth/Throat: Oropharynx is clear and moist. No oropharyngeal exudate.  Cardiovascular: Normal rate, regular rhythm and normal heart sounds. Exam reveals no gallop and no friction rub.  No murmur heard.  Pulmonary/Chest: Effort normal and breath sounds normal. No respiratory distress. He has no wheezes.  Abdominal: Soft. Bowel sounds are normal. He exhibits no distension. There is no tenderness.  Lymphadenopathy:  He has no cervical adenopathy.  Neurological: He is alert and oriented to person, place, and time.  Skin: Skin is warm and dry. No rash noted. No erythema.  Psychiatric: He has a normal mood and affect. His behavior is normal.    Lab Results  Component Value Date   CD4TCELL 21 (L) 05/31/2017   Lab Results  Component Value Date   CD4TABS 620 05/31/2017   CD4TABS 490 01/28/2017   CD4TABS 500 10/28/2016   Lab Results  Component Value Date   HIV1RNAQUANT 31 (H) 12/24/2017   No results found for: HEPBSAB Lab Results  Component Value Date   LABRPR NON REAC 02/12/2017    CBC Lab Results  Component Value Date   WBC 9.8 03/25/2018   RBC 3.81 (L) 03/25/2018   HGB 12.1 (L) 03/25/2018   HCT 33.3 (L) 03/25/2018   PLT 225 03/25/2018   MCV 87.4 03/25/2018   MCH 31.8 03/25/2018   MCHC 36.3 (H) 03/25/2018   RDW 14.2 03/25/2018   LYMPHSABS 3.6 03/24/2018   MONOABS 1.2 (H) 03/24/2018   EOSABS 0.0 03/24/2018    BMET Lab Results  Component Value Date   NA 134 03/30/2018   K 4.9 03/30/2018   CL 99 03/30/2018   CO2  24 03/30/2018   GLUCOSE 95 03/30/2018   BUN 12 03/30/2018   CREATININE 1.07 03/30/2018   CALCIUM 9.8 03/30/2018   GFRNONAA 88 03/30/2018   GFRAA 102 03/30/2018      Assessment and Plan  HIV disease = will check labs today. Plan to continue on his current regimen for now  Long term medication mgmt = no need to change his regimen. His antiplatelet therapy does not interact with his hiv regimen  Health maintenance = will give prevnar 13  Right hip pain = may have AVN. Will get plain films and may need to also get hip mri. Will make referral to orthopedics for evaluation

## 2018-05-06 LAB — RPR: RPR Ser Ql: NONREACTIVE

## 2018-05-06 LAB — CBC WITH DIFFERENTIAL/PLATELET
BASOS ABS: 22 {cells}/uL (ref 0–200)
Basophils Relative: 0.3 %
EOS ABS: 37 {cells}/uL (ref 15–500)
Eosinophils Relative: 0.5 %
HEMATOCRIT: 38.1 % — AB (ref 38.5–50.0)
HEMOGLOBIN: 13.3 g/dL (ref 13.2–17.1)
LYMPHS ABS: 2679 {cells}/uL (ref 850–3900)
MCH: 31.8 pg (ref 27.0–33.0)
MCHC: 34.9 g/dL (ref 32.0–36.0)
MCV: 91.1 fL (ref 80.0–100.0)
MPV: 10 fL (ref 7.5–12.5)
Monocytes Relative: 6.4 %
NEUTROS ABS: 4188 {cells}/uL (ref 1500–7800)
Neutrophils Relative %: 56.6 %
Platelets: 254 10*3/uL (ref 140–400)
RBC: 4.18 10*6/uL — ABNORMAL LOW (ref 4.20–5.80)
RDW: 13.8 % (ref 11.0–15.0)
Total Lymphocyte: 36.2 %
WBC: 7.4 10*3/uL (ref 3.8–10.8)
WBCMIX: 474 {cells}/uL (ref 200–950)

## 2018-05-06 LAB — COMPLETE METABOLIC PANEL WITH GFR
AG Ratio: 1.3 (calc) (ref 1.0–2.5)
ALKALINE PHOSPHATASE (APISO): 91 U/L (ref 40–115)
ALT: 7 U/L — AB (ref 9–46)
AST: 12 U/L (ref 10–40)
Albumin: 3.9 g/dL (ref 3.6–5.1)
BILIRUBIN TOTAL: 0.4 mg/dL (ref 0.2–1.2)
BUN: 12 mg/dL (ref 7–25)
CHLORIDE: 107 mmol/L (ref 98–110)
CO2: 25 mmol/L (ref 20–32)
Calcium: 9.1 mg/dL (ref 8.6–10.3)
Creat: 1.16 mg/dL (ref 0.60–1.35)
GFR, Est African American: 93 mL/min/{1.73_m2} (ref >=60)
GFR, Est Non African American: 80 mL/min/{1.73_m2} (ref >=60)
GLOBULIN: 2.9 g/dL (ref 1.9–3.7)
Glucose, Bld: 113 mg/dL — ABNORMAL HIGH (ref 65–99)
Potassium: 3.7 mmol/L (ref 3.5–5.3)
Sodium: 138 mmol/L (ref 135–146)
Total Protein: 6.8 g/dL (ref 6.1–8.1)

## 2018-05-06 LAB — T-HELPER CELL (CD4) - (RCID CLINIC ONLY)
CD4 T CELL ABS: 660 /uL (ref 400–2700)
CD4 T CELL HELPER: 24 % — AB (ref 33–55)

## 2018-05-08 LAB — HIV-1 RNA QUANT-NO REFLEX-BLD
HIV 1 RNA Quant: 50 copies/mL — ABNORMAL HIGH
HIV-1 RNA QUANT, LOG: 1.7 {Log_copies}/mL — AB

## 2018-05-10 ENCOUNTER — Ambulatory Visit (INDEPENDENT_AMBULATORY_CARE_PROVIDER_SITE_OTHER): Payer: Medicare Other | Admitting: Orthopaedic Surgery

## 2018-05-18 ENCOUNTER — Other Ambulatory Visit: Payer: Medicare Other | Admitting: *Deleted

## 2018-05-18 DIAGNOSIS — I255 Ischemic cardiomyopathy: Secondary | ICD-10-CM | POA: Diagnosis not present

## 2018-05-19 ENCOUNTER — Encounter (INDEPENDENT_AMBULATORY_CARE_PROVIDER_SITE_OTHER): Payer: Self-pay | Admitting: Orthopaedic Surgery

## 2018-05-19 ENCOUNTER — Ambulatory Visit (INDEPENDENT_AMBULATORY_CARE_PROVIDER_SITE_OTHER): Payer: Medicare Other | Admitting: Orthopaedic Surgery

## 2018-05-19 DIAGNOSIS — M25551 Pain in right hip: Secondary | ICD-10-CM | POA: Diagnosis not present

## 2018-05-19 DIAGNOSIS — I255 Ischemic cardiomyopathy: Secondary | ICD-10-CM

## 2018-05-19 LAB — BASIC METABOLIC PANEL
BUN / CREAT RATIO: 14 (ref 9–20)
BUN: 14 mg/dL (ref 6–20)
CALCIUM: 9.1 mg/dL (ref 8.7–10.2)
CHLORIDE: 100 mmol/L (ref 96–106)
CO2: 25 mmol/L (ref 20–29)
Creatinine, Ser: 0.98 mg/dL (ref 0.76–1.27)
GFR, EST AFRICAN AMERICAN: 113 mL/min/{1.73_m2} (ref >59)
GFR, EST NON AFRICAN AMERICAN: 98 mL/min/{1.73_m2} (ref >59)
Glucose: 65 mg/dL (ref 65–99)
POTASSIUM: 4.1 mmol/L (ref 3.5–5.2)
SODIUM: 138 mmol/L (ref 134–144)

## 2018-05-19 NOTE — Progress Notes (Signed)
Office Visit Note   Patient: Rodney Walker           Date of Birth: Mar 19, 1981           MRN: [id number] Visit Date: 05/19/2018              Requested by: Judyann Munson, MD [address] [place], [place] [address] PCP: Patient, No Pcp Per   Assessment & Plan: Visit Diagnoses:  1. Pain of right hip joint     Plan: Impression is right hip avascular necrosis.  At this point, we will obtain an MRI scan of the right hip to further assess this.  He will follow-up with [place] once that has been completed.  Call with concerns or questions in the meantime.  Follow-Up Instructions: Return in about 2 weeks (around 06/02/2018).   Orders:  No orders of the defined types were placed in this encounter.  No orders of the defined types were placed in this encounter.     Procedures: No procedures performed   Clinical Data: No additional findings.   Subjective: Chief Complaint  Patient presents with  . Right Hip - Pain    HPI patient is a 37 year old gentleman who presents to our clinic today with right hip pain.  This began approximately 9 months ago was initially intermittent.  This is become more constant in nature.  The pain he has is to the top of his thigh and occasionally in the groin with certain activities to include pivoting.  He does have occasional night pain as well.  He takes Tylenol on a regular basis with moderate relief of symptoms.  No numbness, tingling or burning.  No previous cortisone injection to the right hip.  Of note, he does have a history of HIV, coronary artery disease with stent placement, COPD and acute MI from March of this past year.  Review of Systems as detailed in HPI.  All others reviewed and are negative.   Objective: Vital Signs: There were no vitals taken for this visit.  Physical Exam well-developed well-nourished gentleman no acute distress.  Alert and oriented x3.  Ortho Exam examination of the right hip is a positive  logroll with pain but really no limited internal rotation.  Negative straight leg raise.  No tenderness to the greater trochanter.  He is neurovascularly intact distally.  Specialty Comments:  No specialty comments available.  Imaging: X-rays reviewed by me in canopy from 05/05/2018 show questionable early avascular necrosis.   PMFS History: Patient Active Problem List   Diagnosis Date Noted  . Hyperlipidemia 04/05/2018  . Ischemic cardiomyopathy 04/05/2018  . History of acute inferior wall MI 03/24/2018  . Marijuana abuse, continuous 10/29/2016  . Moderate persistent asthma without complication 10/29/2016  . Allergic rhinitis 10/08/2016  . Anemia 05/03/2015  . Lung abscess (HCC) 04/22/2015  . Sepsis due to pneumonia (HCC) 04/17/2015  . Bullous emphysema (HCC)   . Lobar pneumonia (HCC)   . Empyema lung (HCC) 04/16/2015  . HIV (human immunodeficiency virus infection) (HCC)   . Tobacco abuse 03/21/2015  . Lung mass 08/31/2014  . 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 Myalgia 04/23/2014  . AIDS (HCC) 03/22/2014  . CAP (community acquired pneumonia) 03/22/2014  . CAD (coronary artery disease) 11/13/2011   Past Medical History:  Diagnosis Date  . COPD (chronic obstructive pulmonary disease) (HCC)   . Coronary artery disease    MI 2010 - dissection/plaque rupture in LAD tx with antiplt/antithrombotics >> relook cath with  resolution (no PCI performed) // Inf STEMI 3/19: OM1 30; pRCA 100 >> DES // Echo 3/19:  mild LVH, EF 40-45, inf/inf-sept HK, trivial MR  . Eczema   . History of myocardial infarction 2010; 2019  . History of pleural empyema   . HIV infection (HCC)   . Hyperlipidemia   . Immune deficiency disorder (HCC)   . Ischemic cardiomyopathy 04/05/2018  . Moderate persistent asthma without complication 10/29/2016    Family History  Problem Relation Age of Onset  . Diabetes Mother   . Eczema Mother   . Asthma Mother   . COPD Mother   . Hypertension Mother   .  Hyperlipidemia Mother   . Stroke Mother   . Heart disease Mother   . Sleep apnea Mother   . Liver cancer Father   . Asthma Brother     Past Surgical History:  Procedure Laterality Date  . CARDIAC CATHETERIZATION  06/24/2009   EF 60%  . CARDIAC CATHETERIZATION  06/17/2009   EF 45%. ANTERIOR HYPOKINESIS  . CORONARY STENT INTERVENTION N/A 03/24/2018   Procedure: CORONARY STENT INTERVENTION;  Surgeon: Tonny Bollman, MD;  Location: [facility] INVASIVE CV LAB;  Service: Cardiovascular;  Laterality: N/A;  . CORONARY/GRAFT ACUTE MI REVASCULARIZATION N/A 03/24/2018   Procedure: Coronary/Graft Acute MI Revascularization;  Surgeon: Tonny Bollman, MD;  Location: [facility] INVASIVE CV LAB;  Service: Cardiovascular;  Laterality: N/A;  . LEFT HEART CATH AND CORONARY ANGIOGRAPHY N/A 03/24/2018   Procedure: LEFT HEART CATH AND CORONARY ANGIOGRAPHY;  Surgeon: Tonny Bollman, MD;  Location: [facility] INVASIVE CV LAB;  Service: Cardiovascular;  Laterality: N/A;  . US ECHOCARDIOGRAPHY  12/09/2009   EF 55-60%  . VIDEO ASSISTED THORACOSCOPY (VATS)/ LOBECTOMY Right 04/23/2015   Procedure: VIDEO ASSISTED THORACOSCOPY (VATS)/RIGHT upper  and right middle LOBECTOMY;  Surgeon: Kerin Perna, MD;  Location: [facility] OR;  Service: Thoracic;  Laterality: Right;  Marland Kitchen VIDEO BRONCHOSCOPY Bilateral 09/05/2014   Procedure: VIDEO BRONCHOSCOPY WITH FLUORO;  Surgeon: Merwyn Katos, MD;  Location: [facility] ENDOSCOPY;  Service: Cardiopulmonary;  Laterality: Bilateral;  . VIDEO BRONCHOSCOPY Bilateral 04/10/2015   Procedure: VIDEO BRONCHOSCOPY WITH FLUORO;  Surgeon: Lupita Leash, MD;  Location: [facility] ENDOSCOPY;  Service: Cardiopulmonary;  Laterality: Bilateral;   Social History   Occupational History  . Occupation: unemployed  Tobacco Use  . Smoking status: Former Smoker    Packs/day: 0.25    Years: 17.00    Pack years: 4.25    Types: Cigarettes    Last attempt to quit: 03/23/2015    Years since quitting: 3.1  . Smokeless tobacco: Never Used  Substance  and Sexual Activity  . Alcohol use: No    Alcohol/week: 0.0 oz  . Drug use: Yes    Frequency: 2.0 times per week    Types: Marijuana  . Sexual activity: Not Currently    Partners: Male    Comment: given condoms

## 2018-05-20 ENCOUNTER — Other Ambulatory Visit: Payer: Self-pay | Admitting: Internal Medicine

## 2018-05-26 ENCOUNTER — Other Ambulatory Visit: Payer: Self-pay | Admitting: Internal Medicine

## 2018-05-30 ENCOUNTER — Encounter: Payer: Medicare Other | Attending: Physician Assistant | Admitting: Registered"

## 2018-05-30 ENCOUNTER — Encounter: Payer: Self-pay | Admitting: Registered"

## 2018-05-30 DIAGNOSIS — I25119 Atherosclerotic heart disease of native coronary artery with unspecified angina pectoris: Secondary | ICD-10-CM

## 2018-05-30 DIAGNOSIS — J45909 Unspecified asthma, uncomplicated: Secondary | ICD-10-CM | POA: Diagnosis not present

## 2018-05-30 DIAGNOSIS — I2111 ST elevation (STEMI) myocardial infarction involving right coronary artery: Secondary | ICD-10-CM | POA: Diagnosis not present

## 2018-05-30 DIAGNOSIS — Z713 Dietary counseling and surveillance: Secondary | ICD-10-CM | POA: Diagnosis not present

## 2018-05-30 DIAGNOSIS — I2510 Atherosclerotic heart disease of native coronary artery without angina pectoris: Secondary | ICD-10-CM | POA: Insufficient documentation

## 2018-05-30 DIAGNOSIS — B20 Human immunodeficiency virus [HIV] disease: Secondary | ICD-10-CM | POA: Insufficient documentation

## 2018-05-30 NOTE — Patient Instructions (Signed)
Instructions/Goals:  Great job with being mindful about sodium content and working to include 3 meals per day!    -Continue including 3 meals per day  Recommend having a snack in between meals-especially when you start increasing your activity.   Include more protein at breakfast time (eggs, [place] yogurt, peanut butter, etc)  -Continue being mindful of sodium content and saturated fat content to promote heart health (try to limit items with 20% or more daily value for sodium or saturated fat).   -Try to include more water and less sugar sweetened beverages:   Goal-at least 24 oz per day (short-term goal)-64 oz (long-term goal)

## 2018-05-30 NOTE — Progress Notes (Signed)
Medical Nutrition Therapy:  Appt start time: 0927 end time:  1013.  Assessment:  Primary concerns today: Pt referred due to heart attack. Pt has been dx with CAD, HIV, and asthma. Nutrition Follow-Up: Pt present for appointment alone. Pt reports that he has made a lot of changes with his eating habits since last appointment. He reports that he has not been using salt at home, has been reading labels to help reduce sodium intake, and has reduced how often he eats out (once a week). He reports that when he does dine out he asks for less salt on foods. Pt reports he has been following a low sodium group on Facebook which has been helpful with finding good recipes. He reports that he has become accustomed to lower sodium foods now and no longer dislikes them.   Pt reports his main concern now has been whether he is taking in too much sugar because he was told his blood sugar was within the prediabetic range during his last hospital visit and he knows he has a [facility] of [place]. He reports that he does not yet have a PCP but plans to get one and have blood drawn to further test for prediabetes. Pt reports he will be attending the cardiac rehab program. Pt reports that he has been doing better with getting in 3 meals more often. 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 He reports that he is no longer concerned about his weight now that he likes the low sodium foods. Pt reports that he does not usually drink water. He does like sparkling water, but does not buy it often due to cost. Pt requested information regarding local food assistance resources.   Weight Hx: 05/30/18: 134 lb  04/27/18: 132 lb 04/05/18: 136 lb 09/29/17: 138 lb  04/27/17: 134 lb  04/21/16: 132.5 lb   Preferred Learning Style:  No preference indicated   Learning Readiness:    Ready  MEDICATIONS: See list.     DIETARY INTAKE:  Usual eating pattern includes 3 meals and sometimes a snacks each day. Pt reports sometimes eating unsalted nuts with dried cranberries as a snack.   Everyday foods include rice.  Avoided foods include hamburgers (pt reports that after eating hamburgers they make him feel bloated and he has concerns about number of calories).    24-hr recall: Weekend (pt reports that he typically eats more during the week than on weekends due to busy weekend schedule)  B (10 AM): bagel with cream cheese and jelly, whole milk  Snk ( AM):  None reported.  L (145 PM): salad-lettuce, tomatoes, cheese, croutons, [place], Dr Reino Kent  Snk ( PM): None reported.  D (830 PM): chicken sub-lettuce, tomatoes, mayo, mustard, vinegar and oil, pepper, Sprite   Snk (10 PM): Frosted Flakes cereal with whole milk Beverages: half a cup water, Sprite, Dr. Reino Kent, whole milk  Usual physical activity: 45 minute walks depending on weather. Sometimes jogs.    Estimated energy needs: ~2400-2600 calories 270-423 g carbohydrates 49 g protein 53-101 g fat  Progress Towards Goal(s):  Some progress.-Pt is now getting in 3 meals most days.    Nutritional Diagnosis:  NB-1.1 Food and nutrition-related knowledge deficit As related to heart healthy nutrition therapy .  As evidenced by pt unsure how to eat to promote  heart health . NI-5.11.1 Predicted suboptimal nutrient intake As related to skipping meals .  As evidenced by pt reports he typically only eats 2 meals per day .    Intervention:  Nutrition counseling provided. Dietitian praised pt's progress with being more mindful of sodium content of foods and working to have 3 meals per day. Discussed being mindful of saturated fat in foods (limit those with 20% DV or more). Cautioned to check label of salt substitutes/sodium free seasonings to avoid those with added potassium chloride. Discussed how having balanced meals helps with  balancing blood sugar. Provided education on relationship between dietary intake of carbohydrates and blood sugar management. Discussed including more protein at breakfast and including a snack in between meals, especially when activity level increases with cardiac rehab to help prevent weight loss. Discussed snack ideas (Greek yogurt, fruit or crackers with peanut butter, nuts, etc). Pt appeared agreeable to information/goals discussed. Discussed getting in more water-can include sparkling water and that [employer] carries sparkling water that is cheaper. Provided information on local food assistance programs. Encouraged pt to see a PCP to have blood sugar labs drawn. Pt appeared agreeable to information/goals discussed.   Instructions/Goals:  Great job with being mindful about sodium content and working to include 3 meals per day!    -Continue including 3 meals per day  Recommend having a snack in between meals-especially when you start increasing your activity.   Include more protein at breakfast time (eggs, [place] yogurt, peanut butter, etc)  -Continue being mindful of sodium content and saturated fat content to promote heart health (try to limit items with 20% or more daily value for sodium or saturated fat).   -Try to include more water and less sugar sweetened beverages:   Goal-at least 24 oz per day (short-term goal)-64 oz (long-term goal)   Teaching Method Utilized:  Visual Auditory  Handouts given during visit include:  [employer] Book Financial planner resources)   [employer] Book (Free [employer])   Barriers to learning/adherence to lifestyle change: Pt has concerns regarding price of healthier foods.   Demonstrated degree of understanding via:  Teach Back   Monitoring/Evaluation:  Dietary intake, exercise, and body weight in 1 month(s).

## 2018-05-31 ENCOUNTER — Telehealth (HOSPITAL_COMMUNITY): Payer: Self-pay | Admitting: Pharmacist

## 2018-05-31 NOTE — Telephone Encounter (Signed)
Cardiac Rehab Medication Review by a Pharmacist  Does the patient  feel that his/her medications are working for him/her?  Yes, when patient goes for lab work, numbers looking better each time he goes.   Has the patient been experiencing any side effects to the medications prescribed?  No, tolerating medications pretty well.  Does the patient measure his/her own blood pressure or blood glucose at home?  No he does not while at home, but does when he has appointments. Most recent was a month ago with Dr. Drue Second.  Does the patient have any problems obtaining medications due to transportation or finances?   Yes, sometimes the medications can be costly.   Understanding of regimen: good Understanding of indications: good Potential of compliance: good   Pharmacist comments: Patient has to have specialty medications filled at [facility] on [address] even though there is a [facility] near his home and this can make it challenging to get medications. Also, copays are more difficult some months versus other months. Otherwise, no other questions or concerns and is looking forward to appointment next week.  Donnella Bi, PharmD PGY1 Acute Care Pharmacy Resident 05/31/2018 4:15 PM

## 2018-06-04 ENCOUNTER — Inpatient Hospital Stay: Admission: RE | Admit: 2018-06-04 | Payer: Medicare Other | Source: Ambulatory Visit

## 2018-06-09 ENCOUNTER — Encounter (HOSPITAL_COMMUNITY)
Admission: RE | Admit: 2018-06-09 | Discharge: 2018-06-09 | Disposition: A | Discharge status: Home / Self Care | Payer: Medicare Other | Source: Ambulatory Visit | Attending: Cardiovascular Disease | Admitting: Cardiovascular Disease

## 2018-06-09 VITALS — Ht 65.5 in | Wt 135.6 lb

## 2018-06-09 DIAGNOSIS — Z955 Presence of coronary angioplasty implant and graft: Secondary | ICD-10-CM

## 2018-06-09 DIAGNOSIS — Z48812 Encounter for surgical aftercare following surgery on the circulatory system: Secondary | ICD-10-CM | POA: Diagnosis not present

## 2018-06-09 DIAGNOSIS — I2111 ST elevation (STEMI) myocardial infarction involving right coronary artery: Secondary | ICD-10-CM | POA: Insufficient documentation

## 2018-06-09 DIAGNOSIS — I213 ST elevation (STEMI) myocardial infarction of unspecified site: Secondary | ICD-10-CM

## 2018-06-09 NOTE — Progress Notes (Signed)
Cardiac Individual Treatment Plan  Patient Details  Name: Rodney Walker MRN: [id number] Date of Birth: 1981/07/14 Referring Provider:     CARDIAC REHAB PHASE II ORIENTATION from 06/09/2018 in [facility] CARDIAC [facility]  Referring Provider  [name], [name] MD       Initial Encounter Date:    CARDIAC REHAB PHASE II ORIENTATION from 06/09/2018 in [facility] CARDIAC REHAB  Date  06/09/18  Referring Provider  [name], [name] MD       Visit Diagnosis: ST elevation myocardial infarction (STEMI), unspecified artery (HCC)  Stented coronary artery  Patient's Home Medications on Admission:  Current Outpatient Medications:  .  albuterol (PROVENTIL HFA;VENTOLIN HFA) 108 (90 Base) MCG/ACT inhaler, Inhale 4 puffs into the lungs every 4 (four) hours as needed for wheezing or shortness of breath. (Patient not taking: Reported on 05/31/2018), Disp: 1 each, Rfl: 2 .  aspirin 81 MG EC tablet, Take 1 tablet (81 mg total) by mouth daily., Disp: 90 tablet, Rfl: 3 .  atorvastatin (LIPITOR) 40 MG tablet, Take 1 tablet (40 mg total) by mouth daily at 6 PM., Disp: 90 tablet, Rfl: 3 .  BIKTARVY 50-200-25 MG TABS tablet, TAKE 1 TABLET BY MOUTH DAILY, Disp: 30 tablet, Rfl: 0 .  cetirizine (ZYRTEC) 10 MG tablet, TAKE 1 TABLET(10 MG) BY MOUTH DAILY, Disp: 30 tablet, Rfl: 5 .  darunavir-cobicistat (PREZCOBIX) 800-150 MG tablet, Take 1 tablet daily with breakfast by mouth. Swallow whole. Do NOT crush, break or chew tablets. Take with food., Disp: 30 tablet, Rfl: 5 .  diclofenac sodium (VOLTAREN) 1 % GEL, Apply 2 g topically 4 (four) times daily., Disp: 100 g, Rfl: 0 .  lisinopril (PRINIVIL,ZESTRIL) 2.5 MG tablet, Take 1 tablet (2.5 mg total) by mouth daily., Disp: 90 tablet, Rfl: 3 .  methocarbamol (ROBAXIN) 500 MG tablet, Take 1 tablet (500 mg total) by mouth at bedtime as needed for muscle spasms. (Patient not taking: Reported on 05/31/2018), Disp: 20 tablet, Rfl: 0 .   metoprolol tartrate (LOPRESSOR) 25 MG tablet, Take 0.5 tablets (12.5 mg total) by mouth 2 (two) times daily., Disp: 60 tablet, Rfl: 6 .  nitroGLYCERIN (NITROSTAT) 0.4 MG SL tablet, Place 1 tablet (0.4 mg total) under the tongue every 5 (five) minutes x 3 doses as needed for chest pain., Disp: 25 tablet, Rfl: 2 .  prasugrel (EFFIENT) 10 MG TABS tablet, Take 1 tablet (10 mg total) by mouth daily. First dose is 60 mg ; beginning the second day you will be on 10 mg daily, Disp: 100 tablet, Rfl: 3  Past Medical History: Past Medical History:  Diagnosis Date  . COPD (chronic obstructive pulmonary disease) (HCC)   . Coronary artery disease    MI 2010 - dissection/plaque rupture in LAD tx with antiplt/antithrombotics >> relook cath with resolution (no PCI performed) // Inf STEMI 3/19: OM1 30; pRCA 100 >> DES // Echo 3/19:  mild LVH, EF 40-45, inf/inf-sept HK, trivial MR  . Eczema   . History of myocardial infarction 2010; 2019  . History of pleural empyema   . HIV infection (HCC)   . Hyperlipidemia   . Immune deficiency disorder (HCC)   . Ischemic cardiomyopathy 04/05/2018  . Moderate persistent asthma without complication 10/29/2016    Tobacco Use: Social History   Tobacco Use  Smoking Status Former Smoker  . Packs/day: 0.25  . Years: 17.00  . Pack years: 4.25  . Types: Cigarettes  . Last attempt to quit: 03/23/2015  .  Years since quitting: 3.2  Smokeless Tobacco Never Used    Labs: Recent Review Flowsheet Data    Labs for ITP Cardiac and Pulmonary Rehab Latest Ref Rng & Units 03/03/2016 04/13/2016 02/12/2017 03/24/2018 03/25/2018   Cholestrol 0 - 200 mg/dL - 370 [phone]   LDLCALC 0 - 99 mg/dL - 503 888(K) 800(L) 95   HDL >40 mg/dL - 41 44 45 49(Z)   Trlycerides <150 mg/dL - 80 77 791 505   Hemoglobin A1c 4.8 - 5.6 % 5.2 - - 5.0 -   PHART 7.350 - 7.450 - - - - -   PCO2ART 35.0 - 45.0 mmHg - - - - -   HCO3 20.0 - 24.0 mEq/L - - - - -   TCO2 0 - 100 mmol/L - - - - -   O2SAT % - - - - -       Capillary Blood Glucose: Lab Results  Component Value Date   GLUCAP 106 (H) 03/25/2018   GLUCAP 93 03/25/2018   GLUCAP 109 (H) 03/25/2018   GLUCAP 99 06/19/2009     Exercise Target Goals: Date: 06/09/18  Exercise Program Goal: Individual exercise prescription set using results from initial 6 min walk test and THRR while considering  patient's activity barriers and safety.   Exercise Prescription Goal: Initial exercise prescription builds to 30-45 minutes a day of aerobic activity, 2-3 days per week.  Home exercise guidelines will be given to patient during program as part of exercise prescription that the participant will acknowledge.  Activity Barriers & Risk Stratification: Activity Barriers & Cardiac Risk Stratification - 06/09/18 0909      Activity Barriers & Cardiac Risk Stratification   Activity Barriers  Other (comment)    Comments  Chronic Right Hip Pain    Cardiac Risk Stratification  High       6 Minute Walk: 6 Minute Walk    Row Name 06/09/18 1143         6 Minute Walk   Phase  Initial     Distance  1743 feet     Walk Time  3 minutes     # of Rest Breaks  0     MPH  3.3     METS  5.67     RPE  7     Perceived Dyspnea   0     VO2 Peak  19.84     Symptoms  No     Resting HR  70 bpm     Resting BP  104/60     Resting Oxygen Saturation   97 %     Exercise Oxygen Saturation  during 6 min walk  98 %     Max Ex. HR  103 bpm     Max Ex. BP  118/72     2 Minute Post BP  102/58        Oxygen Initial Assessment:   Oxygen Re-Evaluation:   Oxygen Discharge (Final Oxygen Re-Evaluation):   Initial Exercise Prescription: Initial Exercise Prescription - 06/09/18 1100      Date of Initial Exercise RX and Referring Provider   Date  06/09/18    Referring Provider  [name], [name] MD       Treadmill   MPH  2.5    Grade  1    Minutes  10    METs  3.26      Bike   Level  1    Minutes  10  METs  4.14      NuStep   Level  3    SPM  75     Minutes  10    METs  3.5      Prescription Details   Frequency (times per week)  3x    Duration  Progress to 30 minutes of continuous aerobic without signs/symptoms of physical distress      Intensity   THRR 40-80% of Max Heartrate  73-146    Ratings of Perceived Exertion  11-13    Perceived Dyspnea  0-4      Progression   Progression  Continue progressive overload as per policy without signs/symptoms or physical distress.      Resistance Training   Training Prescription  Yes    Weight  5lb    Reps  10-15       Perform Capillary Blood Glucose checks as needed.  Exercise Prescription Changes:   Exercise Comments:   Exercise Goals and Review: Exercise Goals    Row Name 06/09/18 1144             Exercise Goals   Increase Physical Activity  Yes       Intervention  Develop an individualized exercise prescription for aerobic and resistive training based on initial evaluation findings, risk stratification, comorbidities and participant's personal goals.;Provide advice, education, support and counseling about physical activity/exercise needs.       Expected Outcomes  Long Term: Add in home exercise to make exercise part of routine and to increase amount of physical activity.;Short Term: Attend rehab on a regular basis to increase amount of physical activity.;Long Term: Exercising regularly at least 3-5 days a week.       Increase Strength and Stamina  Yes       Intervention  Provide advice, education, support and counseling about physical activity/exercise needs.;Develop an individualized exercise prescription for aerobic and resistive training based on initial evaluation findings, risk stratification, comorbidities and participant's personal goals.       Expected Outcomes  Long Term: Improve cardiorespiratory fitness, muscular endurance and strength as measured by increased METs and functional capacity ( );Short Term: Perform resistance training exercises routinely during  rehab and add in resistance training at home;Short Term: Increase workloads from initial exercise prescription for resistance, speed, and METs.       Able to understand and use rate of perceived exertion (RPE) scale  Yes       Intervention  Provide education and explanation on how to use RPE scale       Expected Outcomes  Short Term: Able to use RPE daily in rehab to express subjective intensity level;Long Term:  Able to use RPE to guide intensity level when exercising independently       Knowledge and understanding of Target Heart Rate Range (THRR)  Yes       Intervention  Provide education and explanation of THRR including how the numbers were predicted and where they are located for reference       Expected Outcomes  Short Term: Able to use daily as guideline for intensity in rehab;Long Term: Able to use THRR to govern intensity when exercising independently;Short Term: Able to state/look up THRR       Able to check pulse independently  Yes       Intervention  Provide education and demonstration on how to check pulse in carotid and radial arteries.;Review the importance of being able to check your own pulse for safety during independent exercise  Expected Outcomes  Short Term: Able to explain why pulse checking is important during independent exercise;Long Term: Able to check pulse independently and accurately       Understanding of Exercise Prescription  Yes       Intervention  Provide education, explanation, and written materials on patient's individual exercise prescription       Expected Outcomes  Long Term: Able to explain home exercise prescription to exercise independently;Short Term: Able to explain program exercise prescription          Exercise Goals Re-Evaluation :    Discharge Exercise Prescription (Final Exercise Prescription Changes):   Nutrition:  Target Goals: Understanding of nutrition guidelines, daily intake of sodium 1500mg , cholesterol 200mg , calories 30% from  fat and 7% or less from saturated fats, daily to have 5 or more servings of fruits and vegetables.  Biometrics: Pre Biometrics - 06/09/18 1144      Pre Biometrics   Height  5' 5.5" (1.664 m)    Weight  135 lb 9.3 oz (61.5 kg)    Waist Circumference  28 inches    Hip Circumference  30.5 inches    Waist to Hip Ratio  0.92 %    BMI (Calculated)  22.21    Triceps Skinfold  6 mm    % Body Fat  15.4 %    Grip Strength  48 kg    Flexibility  13 in    Single Leg Stand  30 seconds        Nutrition Therapy Plan and Nutrition Goals:   Nutrition Assessments:   Nutrition Goals Re-Evaluation:   Nutrition Goals Re-Evaluation:   Nutrition Goals Discharge (Final Nutrition Goals Re-Evaluation):   Psychosocial: Target Goals: Acknowledge presence or absence of significant depression and/or stress, maximize coping skills, provide positive support system. Participant is able to verbalize types and ability to use techniques and skills needed for reducing stress and depression.  Initial Review & Psychosocial Screening: Initial Psych Review & Screening - 06/09/18 1052      Initial Review   Current issues with  Current Stress Concerns    Source of Stress Concerns  Transportation    Comments  Rodney Walker inquired about bus vouchers.  Contact information given for Medicaid transportation help.      Family Dynamics   Good Support System?  Yes Rodney Walker lists his family and friends as his support system.      Barriers   Psychosocial barriers to participate in program  There are no identifiable barriers or psychosocial needs.      Screening Interventions   Interventions  Encouraged to exercise       Quality of Life Scores: Quality of Life - 06/09/18 1146      Quality of Life Scores   Health/Function Pre  27.2 %    Socioeconomic Pre  21 %    Psych/Spiritual Pre  28.29 %    Family Pre  27.6 %    GLOBAL Pre  26.06 %      Scores of 19 and below usually indicate a poorer quality of  life in these areas.  A difference of  2-3 points is a clinically meaningful difference.  A difference of 2-3 points in the total score of the Quality of Life Index has been associated with significant improvement in overall quality of life, self-image, physical symptoms, and general health in studies assessing change in quality of life.  PHQ-9: Recent Review Flowsheet Data    Depression screen [facility] 2/9 05/05/2018 04/27/2018 09/29/2017  06/29/2017 01/28/2017   Decreased Interest 0 0 0 0 0   Down, Depressed, Hopeless 0 0 0 0 0   PHQ - 2 Score 0 0 0 0 0     Interpretation of Total Score  Total Score Depression Severity:  1-4 = Minimal depression, 5-9 = Mild depression, 10-14 = Moderate depression, 15-19 = Moderately severe depression, 20-27 = Severe depression   Psychosocial Evaluation and Intervention:   Psychosocial Re-Evaluation:   Psychosocial Discharge (Final Psychosocial Re-Evaluation):   Vocational Rehabilitation: Provide vocational rehab assistance to qualifying candidates.   Vocational Rehab Evaluation & Intervention: Vocational Rehab - 06/09/18 1052      Initial Vocational Rehab Evaluation & Intervention   Assessment shows need for Vocational Rehabilitation  No       Education: Education Goals: Education classes will be provided on a weekly basis, covering required topics. Participant will state understanding/return demonstration of topics presented.  Learning Barriers/Preferences: Learning Barriers/Preferences - 06/09/18 1145      Learning Barriers/Preferences   Learning Barriers  None    Learning Preferences  Written Material;Skilled Demonstration;Individual Instruction       Education Topics: Count Your Pulse:  -Group instruction provided by verbal instruction, demonstration, patient participation and written materials to support subject.  Instructors address importance of being able to find your pulse and how to count your pulse when at home without a heart monitor.   Patients get hands on experience counting their pulse with staff help and individually.   Heart Attack, Angina, and Risk Factor Modification:  -Group instruction provided by verbal instruction, video, and written materials to support subject.  Instructors address signs and symptoms of angina and heart attacks.    Also discuss risk factors for heart disease and how to make changes to improve heart health risk factors.   Functional Fitness:  -Group instruction provided by verbal instruction, demonstration, patient participation, and written materials to support subject.  Instructors address safety measures for doing things around the house.  Discuss how to get up and down off the floor, how to pick things up properly, how to safely get out of a chair without assistance, and balance training.   Meditation and Mindfulness:  -Group instruction provided by verbal instruction, patient participation, and written materials to support subject.  Instructor addresses importance of mindfulness and meditation practice to help reduce stress and improve awareness.  Instructor also leads participants through a meditation exercise.    Stretching for Flexibility and Mobility:  -Group instruction provided by verbal instruction, patient participation, and written materials to support subject.  Instructors lead participants through series of stretches that are designed to increase flexibility thus improving mobility.  These stretches are additional exercise for major muscle groups that are typically performed during regular warm up and cool down.   Hands Only CPR:  -Group verbal, video, and participation provides a basic overview of AHA guidelines for community CPR. Role-play of emergencies allow participants the opportunity to practice calling for help and chest compression technique with discussion of AED use.   Hypertension: -Group verbal and written instruction that provides a basic overview of hypertension  including the most recent diagnostic guidelines, risk factor reduction with self-care instructions and medication management.    Nutrition I class: Heart Healthy Eating:  -Group instruction provided by PowerPoint slides, verbal discussion, and written materials to support subject matter. The instructor gives an explanation and review of the Therapeutic Lifestyle Changes diet recommendations, which includes a discussion on lipid goals, dietary fat, sodium, fiber, plant  stanol/sterol esters, sugar, and the components of a well-balanced, healthy diet.   Nutrition II class: Lifestyle Skills:  -Group instruction provided by PowerPoint slides, verbal discussion, and written materials to support subject matter. The instructor gives an explanation and review of label reading, grocery shopping for heart health, heart healthy recipe modifications, and ways to make healthier choices when eating out.   Diabetes Question & Answer:  -Group instruction provided by PowerPoint slides, verbal discussion, and written materials to support subject matter. The instructor gives an explanation and review of diabetes co-morbidities, pre- and post-prandial blood glucose goals, pre-exercise blood glucose goals, signs, symptoms, and treatment of hypoglycemia and hyperglycemia, and foot care basics.   Diabetes Blitz:  -Group instruction provided by PowerPoint slides, verbal discussion, and written materials to support subject matter. The instructor gives an explanation and review of the physiology behind type 1 and type 2 diabetes, diabetes medications and rational behind using different medications, pre- and post-prandial blood glucose recommendations and Hemoglobin A1c goals, diabetes diet, and exercise including blood glucose guidelines for exercising safely.    Portion Distortion:  -Group instruction provided by PowerPoint slides, verbal discussion, written materials, and food models to support subject matter. The  instructor gives an explanation of serving size versus portion size, changes in portions sizes over the last 20 years, and what consists of a serving from each food group.   Stress Management:  -Group instruction provided by verbal instruction, video, and written materials to support subject matter.  Instructors review role of stress in heart disease and how to cope with stress positively.     Exercising on Your Own:  -Group instruction provided by verbal instruction, power point, and written materials to support subject.  Instructors discuss benefits of exercise, components of exercise, frequency and intensity of exercise, and end points for exercise.  Also discuss use of nitroglycerin and activating EMS.  Review options of places to exercise outside of rehab.  Review guidelines for sex with heart disease.   Cardiac Drugs I:  -Group instruction provided by verbal instruction and written materials to support subject.  Instructor reviews cardiac drug classes: antiplatelets, anticoagulants, beta blockers, and statins.  Instructor discusses reasons, side effects, and lifestyle considerations for each drug class.   Cardiac Drugs II:  -Group instruction provided by verbal instruction and written materials to support subject.  Instructor reviews cardiac drug classes: angiotensin converting enzyme inhibitors (ACE-I), angiotensin II receptor blockers (ARBs), nitrates, and calcium channel blockers.  Instructor discusses reasons, side effects, and lifestyle considerations for each drug class.   Anatomy and Physiology of the Circulatory System:  Group verbal and written instruction and models provide basic cardiac anatomy and physiology, with the coronary electrical and arterial systems. Review of: AMI, Angina, Valve disease, Heart Failure, Peripheral Artery Disease, Cardiac Arrhythmia, Pacemakers, and the ICD.   Other Education:  -Group or individual verbal, written, or video instructions that support  the educational goals of the cardiac rehab program.   Holiday Eating Survival Tips:  -Group instruction provided by PowerPoint slides, verbal discussion, and written materials to support subject matter. The instructor gives patients tips, tricks, and techniques to help them not only survive but enjoy the holidays despite the onslaught of food that accompanies the holidays.   Knowledge Questionnaire Score: Knowledge Questionnaire Score - 06/09/18 1145      Knowledge Questionnaire Score   Pre Score  21/24       Core Components/Risk Factors/Patient Goals at Admission: Personal Goals and Risk Factors at Admission -  06/09/18 1139      Core Components/Risk Factors/Patient Goals on Admission    Weight Management  Yes;Weight Maintenance    Intervention  Weight Management: Develop a combined nutrition and exercise program designed to reach desired caloric intake, while maintaining appropriate intake of nutrient and fiber, sodium and fats, and appropriate energy expenditure required for the weight goal.;Weight Management: Provide education and appropriate resources to help participant work on and attain dietary goals.;Weight Management/Obesity: Establish reasonable short term and long term weight goals.    Admit Weight  135 lb 9.3 oz (61.5 kg)    Goal Weight: Long Term  135 lb (61.2 kg)    Expected Outcomes  Short Term: Continue to assess and modify interventions until short term weight is achieved;Weight Maintenance: Understanding of the daily nutrition guidelines, which includes 25-35% calories from fat, 7% or less cal from saturated fats, less than 200mg  cholesterol, less than 1.5gm of sodium, & 5 or more servings of fruits and vegetables daily;Understanding recommendations for meals to include 15-35% energy as protein, 25-35% energy from fat, 35-60% energy from carbohydrates, less than 200mg  of dietary cholesterol, 20-35 gm of total fiber daily;Understanding of distribution of calorie intake  throughout the day with the consumption of 4-5 meals/snacks    Hypertension  Yes    Intervention  Provide education on lifestyle modifcations including regular physical activity/exercise, weight management, moderate sodium restriction and increased consumption of fresh fruit, vegetables, and low fat dairy, alcohol moderation, and smoking cessation.;Monitor prescription use compliance.    Expected Outcomes  Short Term: Continued assessment and intervention until BP is < 140/88mm HG in hypertensive participants. < 130/67mm HG in hypertensive participants with diabetes, heart failure or chronic kidney disease.;Long Term: Maintenance of blood pressure at goal levels.    Lipids  Yes    Intervention  Provide education and support for participant on nutrition & aerobic/resistive exercise along with prescribed medications to achieve LDL 70mg , HDL >40mg .    Expected Outcomes  Long Term: Cholesterol controlled with medications as prescribed, with individualized exercise RX and with personalized nutrition plan. Value goals: LDL < 70mg , HDL > 40 mg.;Short Term: Participant states understanding of desired cholesterol values and is compliant with medications prescribed. Participant is following exercise prescription and nutrition guidelines.    Stress  Yes    Intervention  Offer individual and/or small group education and counseling on adjustment to heart disease, stress management and health-related lifestyle change. Teach and support self-help strategies.;Refer participants experiencing significant psychosocial distress to appropriate mental health specialists for further evaluation and treatment. When possible, include family members and significant others in education/counseling sessions.    Expected Outcomes  Long Term: Emotional wellbeing is indicated by absence of clinically significant psychosocial distress or social isolation.;Short Term: Participant demonstrates changes in health-related behavior, relaxation  and other stress management skills, ability to obtain effective social support, and compliance with psychotropic medications if prescribed.       Core Components/Risk Factors/Patient Goals Review:    Core Components/Risk Factors/Patient Goals at Discharge (Final Review):    ITP Comments: ITP Comments    Row Name 06/09/18 0850           ITP Comments  Dr. Armanda Magic, Medical Director           Comments: Patient attended orientation from [phone] to 1029 to review rules and guidelines for program. Completed 6 minute walk test, Intitial ITP, and exercise prescription.  VSS. Telemetry-SR.  Asymptomatic.

## 2018-06-09 NOTE — Progress Notes (Signed)
Rodney Walker 37 y.o. male DOB: [birth year] MRN: [id number]      Nutrition Note  1. ST elevation myocardial infarction (STEMI), unspecified artery (HCC)   2. Stented coronary artery    Past Medical History:  Diagnosis Date  . COPD (chronic obstructive pulmonary disease) (HCC)   . Coronary artery disease    MI 2010 - dissection/plaque rupture in LAD tx with antiplt/antithrombotics >> relook cath with resolution (no PCI performed) // Inf STEMI 3/19: OM1 30; pRCA 100 >> DES // Echo 3/19:  mild LVH, EF 40-45, inf/inf-sept HK, trivial MR  . Eczema   . History of myocardial infarction 2010; 2019  . History of pleural empyema   . HIV infection (HCC)   . Hyperlipidemia   . Immune deficiency disorder (HCC)   . Ischemic cardiomyopathy 04/05/2018  . Moderate persistent asthma without complication 10/29/2016   Meds reviewed. Lisinopril, lopressor, prasugrel noted  HT: Ht Readings from Last 1 Encounters:  06/09/18 5' 5.5" (1.664 m)    WT: Wt Readings from Last 5 Encounters:  06/09/18 135 lb 9.3 oz (61.5 kg)  05/30/18 134 lb 3.2 oz (60.9 kg)  05/05/18 134 lb 12.8 oz (61.1 kg)  05/04/18 134 lb 12.8 oz (61.1 kg)  04/27/18 132 lb 3.2 oz (60 kg)     Body mass index is 22.22 kg/m.   Current tobacco use? No   Labs:  Lipid Panel     Component Value Date/Time   CHOL 155 03/25/2018 0519   TRIG 102 03/25/2018 0519   HDL 40 (L) 03/25/2018 0519   CHOLHDL 3.9 03/25/2018 0519   VLDL 20 03/25/2018 0519   LDLCALC 95 03/25/2018 0519    Lab Results  Component Value Date   HGBA1C 5.0 03/24/2018   CBG (last 3)  No results for input(s): GLUCAP in the last 72 hours.  No recent Fasting BG  Nutrition Note Spoke with pt. Nutrition plan and goals reviewed with pt. Pt is following Step 1 of the Therapeutic Lifestyle Changes diet. Pt does not want to lose wt. Would like to focus on eating heart healthy diet.    Pt with dx of CHF. Per discussion, pt does not use canned/convenience foods  often. Pt rarely adds salt to food.   Pt expressed understanding of the information reviewed. Pt aware of nutrition education classes offered and he plans on attending nutrition classes when able.  Nutrition Diagnosis ? Food-and nutrition-related knowledge deficit related to lack of exposure to information as related to diagnosis of: ? CVD  ?  Nutrition Intervention ? Pt's individual nutrition plan and goals reviewed with pt.   Nutrition Goal(s):  ? Pt to identify and limit food sources of saturated fat, trans fat, and sodium ? Pt to use healthy seasonings for vegetables and salads. ? Pt to make good choices when eating out at restaurants.  Plan:  Pt to attend nutrition classes ? Nutrition I ? Nutrition II ? Portion Distortion  Will provide client-centered nutrition education as part of interdisciplinary care.   Monitor and evaluate progress toward nutrition goal with team.  Ross Marcus, MS, RD, LDN 06/09/2018 12:47 PM

## 2018-06-10 ENCOUNTER — Ambulatory Visit
Admission: RE | Admit: 2018-06-10 | Discharge: 2018-06-10 | Disposition: A | Discharge status: Home / Self Care | Payer: Medicare Other | Source: Ambulatory Visit | Attending: Orthopaedic Surgery | Admitting: Orthopaedic Surgery

## 2018-06-10 DIAGNOSIS — M25551 Pain in right hip: Secondary | ICD-10-CM

## 2018-06-11 NOTE — Progress Notes (Signed)
Needs f/u appt to discuss MRI

## 2018-06-13 ENCOUNTER — Encounter (HOSPITAL_COMMUNITY)
Admission: RE | Admit: 2018-06-13 | Discharge: 2018-06-13 | Disposition: A | Discharge status: Home / Self Care | Payer: Medicare Other | Source: Ambulatory Visit | Attending: Cardiovascular Disease | Admitting: Cardiovascular Disease

## 2018-06-13 ENCOUNTER — Encounter (HOSPITAL_COMMUNITY): Payer: Medicare Other

## 2018-06-13 DIAGNOSIS — Z48812 Encounter for surgical aftercare following surgery on the circulatory system: Secondary | ICD-10-CM | POA: Diagnosis not present

## 2018-06-13 DIAGNOSIS — Z955 Presence of coronary angioplasty implant and graft: Secondary | ICD-10-CM | POA: Diagnosis not present

## 2018-06-13 DIAGNOSIS — I213 ST elevation (STEMI) myocardial infarction of unspecified site: Secondary | ICD-10-CM

## 2018-06-13 DIAGNOSIS — I2111 ST elevation (STEMI) myocardial infarction involving right coronary artery: Secondary | ICD-10-CM | POA: Diagnosis not present

## 2018-06-13 NOTE — Progress Notes (Signed)
Cardiac Individual Treatment Plan  Patient Details  Name: Rodney Walker MRN: [id number] Date of Birth: [birth year] Referring Provider:     CARDIAC REHAB PHASE II ORIENTATION from 06/09/2018 in [facility] CARDIAC [facility]  Referring Provider  [name], [name] MD       Initial Encounter Date:    CARDIAC REHAB PHASE II ORIENTATION from 06/09/2018 in [facility] CARDIAC REHAB  Date  06/09/18  Referring Provider  [name], [name] MD       Visit Diagnosis: ST elevation myocardial infarction (STEMI), unspecified artery (HCC)  Stented coronary artery  Patient's Home Medications on Admission:  Current Outpatient Medications:  .  aspirin 81 MG EC tablet, Take 1 tablet (81 mg total) by mouth daily., Disp: 90 tablet, Rfl: 3 .  atorvastatin (LIPITOR) 40 MG tablet, Take 1 tablet (40 mg total) by mouth daily at 6 PM., Disp: 90 tablet, Rfl: 3 .  BIKTARVY 50-200-25 MG TABS tablet, TAKE 1 TABLET BY MOUTH DAILY, Disp: 30 tablet, Rfl: 0 .  cetirizine (ZYRTEC) 10 MG tablet, TAKE 1 TABLET(10 MG) BY MOUTH DAILY, Disp: 30 tablet, Rfl: 5 .  darunavir-cobicistat (PREZCOBIX) 800-150 MG tablet, Take 1 tablet daily with breakfast by mouth. Swallow whole. Do NOT crush, break or chew tablets. Take with food., Disp: 30 tablet, Rfl: 5 .  diclofenac sodium (VOLTAREN) 1 % GEL, Apply 2 g topically 4 (four) times daily., Disp: 100 g, Rfl: 0 .  lisinopril (PRINIVIL,ZESTRIL) 2.5 MG tablet, Take 1 tablet (2.5 mg total) by mouth daily., Disp: 90 tablet, Rfl: 3 .  metoprolol tartrate (LOPRESSOR) 25 MG tablet, Take 0.5 tablets (12.5 mg total) by mouth 2 (two) times daily., Disp: 60 tablet, Rfl: 6 .  prasugrel (EFFIENT) 10 MG TABS tablet, Take 1 tablet (10 mg total) by mouth daily. First dose is 60 mg ; beginning the second day you will be on 10 mg daily, Disp: 100 tablet, Rfl: 3 .  albuterol (PROVENTIL HFA;VENTOLIN HFA) 108 (90 Base) MCG/ACT inhaler, Inhale 4 puffs into the lungs  every 4 (four) hours as needed for wheezing or shortness of breath. (Patient not taking: Reported on 05/31/2018), Disp: 1 each, Rfl: 2 .  methocarbamol (ROBAXIN) 500 MG tablet, Take 1 tablet (500 mg total) by mouth at bedtime as needed for muscle spasms. (Patient not taking: Reported on 05/31/2018), Disp: 20 tablet, Rfl: 0 .  nitroGLYCERIN (NITROSTAT) 0.4 MG SL tablet, Place 1 tablet (0.4 mg total) under the tongue every 5 (five) minutes x 3 doses as needed for chest pain. (Patient not taking: Reported on 06/13/2018), Disp: 25 tablet, Rfl: 2  Past Medical History: Past Medical History:  Diagnosis Date  . COPD (chronic obstructive pulmonary disease) (HCC)   . Coronary artery disease    MI 2010 - dissection/plaque rupture in LAD tx with antiplt/antithrombotics >> relook cath with resolution (no PCI performed) // Inf STEMI 3/19: OM1 30; pRCA 100 >> DES // Echo 3/19:  mild LVH, EF 40-45, inf/inf-sept HK, trivial MR  . Eczema   . History of myocardial infarction 2010; 2019  . History of pleural empyema   . HIV infection (HCC)   . Hyperlipidemia   . Immune deficiency disorder (HCC)   . Ischemic cardiomyopathy 04/05/2018  . Moderate persistent asthma without complication 10/29/2016    Tobacco Use: Social History   Tobacco Use  Smoking Status Former Smoker  . Packs/day: 0.25  . Years: 17.00  . Pack years: 4.25  . Types: Cigarettes  .  Last attempt to quit: 03/23/2015  . Years since quitting: 3.2  Smokeless Tobacco Never Used    Labs: Recent Review Flowsheet Data    Labs for ITP Cardiac and Pulmonary Rehab Latest Ref Rng & Units 03/03/2016 04/13/2016 02/12/2017 03/24/2018 03/25/2018   Cholestrol 0 - 200 mg/dL - 161 096 045 409   LDLCALC 0 - 99 mg/dL - 811 914(N) 829(F) 95   HDL >40 mg/dL - 41 44 45 62(Z)   Trlycerides <150 mg/dL - 80 77 308 657   Hemoglobin A1c 4.8 - 5.6 % 5.2 - - 5.0 -   PHART 7.350 - 7.450 - - - - -   PCO2ART 35.0 - 45.0 mmHg - - - - -   HCO3 20.0 - 24.0 mEq/L - - - - -   TCO2  0 - 100 mmol/L - - - - -   O2SAT % - - - - -      Capillary Blood Glucose: Lab Results  Component Value Date   GLUCAP 106 (H) 03/25/2018   GLUCAP 93 03/25/2018   GLUCAP 109 (H) 03/25/2018   GLUCAP 99 06/19/2009     Exercise Target Goals:    Exercise Program Goal: Individual exercise prescription set using results from initial 6 min walk test and THRR while considering  patient's activity barriers and safety.   Exercise Prescription Goal: Initial exercise prescription builds to 30-45 minutes a day of aerobic activity, 2-3 days per week.  Home exercise guidelines will be given to patient during program as part of exercise prescription that the participant will acknowledge.  Activity Barriers & Risk Stratification: Activity Barriers & Cardiac Risk Stratification - 06/09/18 0909      Activity Barriers & Cardiac Risk Stratification   Activity Barriers  Other (comment)    Comments  Chronic Right Hip Pain    Cardiac Risk Stratification  High       6 Minute Walk: 6 Minute Walk    Row Name 06/09/18 1143         6 Minute Walk   Phase  Initial     Distance  1743 feet     Walk Time  3 minutes     # of Rest Breaks  0     MPH  3.3     METS  5.67     RPE  7     Perceived Dyspnea   0     VO2 Peak  19.84     Symptoms  No     Resting HR  70 bpm     Resting BP  104/60     Resting Oxygen Saturation   97 %     Exercise Oxygen Saturation  during 6 min walk  98 %     Max Ex. HR  103 bpm     Max Ex. BP  118/72     2 Minute Post BP  102/58        Oxygen Initial Assessment:   Oxygen Re-Evaluation:   Oxygen Discharge (Final Oxygen Re-Evaluation):   Initial Exercise Prescription: Initial Exercise Prescription - 06/09/18 1100      Date of Initial Exercise RX and Referring Provider   Date  06/09/18    Referring Provider  [name], [name] MD       Treadmill   MPH  2.5    Grade  1    Minutes  10    METs  3.26      Bike   Level  1    Minutes  10    METs  4.14       NuStep   Level  3    SPM  75    Minutes  10    METs  3.5      Prescription Details   Frequency (times per week)  3x    Duration  Progress to 30 minutes of continuous aerobic without signs/symptoms of physical distress      Intensity   THRR 40-80% of Max Heartrate  73-146    Ratings of Perceived Exertion  11-13    Perceived Dyspnea  0-4      Progression   Progression  Continue progressive overload as per policy without signs/symptoms or physical distress.      Resistance Training   Training Prescription  Yes    Weight  5lb    Reps  10-15       Perform Capillary Blood Glucose checks as needed.  Exercise Prescription Changes:   Exercise Comments:   Exercise Goals and Review:  Exercise Goals    Row Name 06/09/18 1144             Exercise Goals   Increase Physical Activity  Yes       Intervention  Develop an individualized exercise prescription for aerobic and resistive training based on initial evaluation findings, risk stratification, comorbidities and participant's personal goals.;Provide advice, education, support and counseling about physical activity/exercise needs.       Expected Outcomes  Long Term: Add in home exercise to make exercise part of routine and to increase amount of physical activity.;Short Term: Attend rehab on a regular basis to increase amount of physical activity.;Long Term: Exercising regularly at least 3-5 days a week.       Increase Strength and Stamina  Yes       Intervention  Provide advice, education, support and counseling about physical activity/exercise needs.;Develop an individualized exercise prescription for aerobic and resistive training based on initial evaluation findings, risk stratification, comorbidities and participant's personal goals.       Expected Outcomes  Long Term: Improve cardiorespiratory fitness, muscular endurance and strength as measured by increased METs and functional capacity ( );Short Term: Perform resistance  training exercises routinely during rehab and add in resistance training at home;Short Term: Increase workloads from initial exercise prescription for resistance, speed, and METs.       Able to understand and use rate of perceived exertion (RPE) scale  Yes       Intervention  Provide education and explanation on how to use RPE scale       Expected Outcomes  Short Term: Able to use RPE daily in rehab to express subjective intensity level;Long Term:  Able to use RPE to guide intensity level when exercising independently       Knowledge and understanding of Target Heart Rate Range (THRR)  Yes       Intervention  Provide education and explanation of THRR including how the numbers were predicted and where they are located for reference       Expected Outcomes  Short Term: Able to use daily as guideline for intensity in rehab;Long Term: Able to use THRR to govern intensity when exercising independently;Short Term: Able to state/look up THRR       Able to check pulse independently  Yes       Intervention  Provide education and demonstration on how to check pulse in carotid and radial arteries.;Review the importance of being able  to check your own pulse for safety during independent exercise       Expected Outcomes  Short Term: Able to explain why pulse checking is important during independent exercise;Long Term: Able to check pulse independently and accurately       Understanding of Exercise Prescription  Yes       Intervention  Provide education, explanation, and written materials on patient's individual exercise prescription       Expected Outcomes  Long Term: Able to explain home exercise prescription to exercise independently;Short Term: Able to explain program exercise prescription          Exercise Goals Re-Evaluation :    Discharge Exercise Prescription (Final Exercise Prescription Changes):   Nutrition:  Target Goals: Understanding of nutrition guidelines, daily intake of sodium 1500mg ,  cholesterol 200mg , calories 30% from fat and 7% or less from saturated fats, daily to have 5 or more servings of fruits and vegetables.  Biometrics: Pre Biometrics - 06/09/18 1144      Pre Biometrics   Height  5' 5.5" (1.664 m)    Weight  135 lb 9.3 oz (61.5 kg)    Waist Circumference  28 inches    Hip Circumference  30.5 inches    Waist to Hip Ratio  0.92 %    BMI (Calculated)  22.21    Triceps Skinfold  6 mm    % Body Fat  15.4 %    Grip Strength  48 kg    Flexibility  13 in    Single Leg Stand  30 seconds        Nutrition Therapy Plan and Nutrition Goals: Nutrition Therapy & Goals - 06/09/18 1245      Nutrition Therapy   Diet  heart healthy       Nutrition Assessments: Nutrition Assessments - 06/09/18 1245      MEDFICTS Scores   Pre Score  49       Nutrition Goals Re-Evaluation:   Nutrition Goals Re-Evaluation:   Nutrition Goals Discharge (Final Nutrition Goals Re-Evaluation):   Psychosocial: Target Goals: Acknowledge presence or absence of significant depression and/or stress, maximize coping skills, provide positive support system. Participant is able to verbalize types and ability to use techniques and skills needed for reducing stress and depression.  Initial Review & Psychosocial Screening: Initial Psych Review & Screening - 06/09/18 1052      Initial Review   Current issues with  Current Stress Concerns    Source of Stress Concerns  Transportation    Comments  Rafe inquired about bus vouchers.  Contact information given for Medicaid transportation help.      Family Dynamics   Good Support System?  Yes Harland lists his family and friends as his support system.      Barriers   Psychosocial barriers to participate in program  There are no identifiable barriers or psychosocial needs.      Screening Interventions   Interventions  Encouraged to exercise       Quality of Life Scores: Quality of Life - 06/09/18 1146      Quality of Life  Scores   Health/Function Pre  27.2 %    Socioeconomic Pre  21 %    Psych/Spiritual Pre  28.29 %    Family Pre  27.6 %    GLOBAL Pre  26.06 %      Scores of 19 and below usually indicate a poorer quality of life in these areas.  A difference of  2-3 points  is a clinically meaningful difference.  A difference of 2-3 points in the total score of the Quality of Life Index has been associated with significant improvement in overall quality of life, self-image, physical symptoms, and general health in studies assessing change in quality of life.  PHQ-9: Recent Review Flowsheet Data    Depression screen [facility] 2/9 05/05/2018 04/27/2018 09/29/2017 06/29/2017 01/28/2017   Decreased Interest 0 0 0 0 0   Down, Depressed, Hopeless 0 0 0 0 0   PHQ - 2 Score 0 0 0 0 0     Interpretation of Total Score  Total Score Depression Severity:  1-4 = Minimal depression, 5-9 = Mild depression, 10-14 = Moderate depression, 15-19 = Moderately severe depression, 20-27 = Severe depression   Psychosocial Evaluation and Intervention: Psychosocial Evaluation - 06/13/18 1500      Psychosocial Evaluation & Interventions   Interventions  Stress management education;Relaxation education;Encouraged to exercise with the program and follow exercise prescription    Comments  Pt with good support of family and freinds.  Pt generally feels hopeful about his future.    Expected Outcomes  Pt will continue to display positive and healthy coping skills.    Continue Psychosocial Services   Follow up required by staff       Psychosocial Re-Evaluation:   Psychosocial Discharge (Final Psychosocial Re-Evaluation):   Vocational Rehabilitation: Provide vocational rehab assistance to qualifying candidates.   Vocational Rehab Evaluation & Intervention: Vocational Rehab - 06/09/18 1052      Initial Vocational Rehab Evaluation & Intervention   Assessment shows need for Vocational Rehabilitation  No       Education: Education Goals:  Education classes will be provided on a weekly basis, covering required topics. Participant will state understanding/return demonstration of topics presented.  Learning Barriers/Preferences: Learning Barriers/Preferences - 06/09/18 1145      Learning Barriers/Preferences   Learning Barriers  None    Learning Preferences  Written Material;Skilled Demonstration;Individual Instruction       Education Topics: Count Your Pulse:  -Group instruction provided by verbal instruction, demonstration, patient participation and written materials to support subject.  Instructors address importance of being able to find your pulse and how to count your pulse when at home without a heart monitor.  Patients get hands on experience counting their pulse with staff help and individually.   Heart Attack, Angina, and Risk Factor Modification:  -Group instruction provided by verbal instruction, video, and written materials to support subject.  Instructors address signs and symptoms of angina and heart attacks.    Also discuss risk factors for heart disease and how to make changes to improve heart health risk factors.   Functional Fitness:  -Group instruction provided by verbal instruction, demonstration, patient participation, and written materials to support subject.  Instructors address safety measures for doing things around the house.  Discuss how to get up and down off the floor, how to pick things up properly, how to safely get out of a chair without assistance, and balance training.   Meditation and Mindfulness:  -Group instruction provided by verbal instruction, patient participation, and written materials to support subject.  Instructor addresses importance of mindfulness and meditation practice to help reduce stress and improve awareness.  Instructor also leads participants through a meditation exercise.    Stretching for Flexibility and Mobility:  -Group instruction provided by verbal instruction,  patient participation, and written materials to support subject.  Instructors lead participants through series of stretches that are designed to increase flexibility  thus improving mobility.  These stretches are additional exercise for major muscle groups that are typically performed during regular warm up and cool down.   Hands Only CPR:  -Group verbal, video, and participation provides a basic overview of AHA guidelines for community CPR. Role-play of emergencies allow participants the opportunity to practice calling for help and chest compression technique with discussion of AED use.   Hypertension: -Group verbal and written instruction that provides a basic overview of hypertension including the most recent diagnostic guidelines, risk factor reduction with self-care instructions and medication management.    Nutrition I class: Heart Healthy Eating:  -Group instruction provided by PowerPoint slides, verbal discussion, and written materials to support subject matter. The instructor gives an explanation and review of the Therapeutic Lifestyle Changes diet recommendations, which includes a discussion on lipid goals, dietary fat, sodium, fiber, plant stanol/sterol esters, sugar, and the components of a well-balanced, healthy diet.   Nutrition II class: Lifestyle Skills:  -Group instruction provided by PowerPoint slides, verbal discussion, and written materials to support subject matter. The instructor gives an explanation and review of label reading, grocery shopping for heart health, heart healthy recipe modifications, and ways to make healthier choices when eating out.   Diabetes Question & Answer:  -Group instruction provided by PowerPoint slides, verbal discussion, and written materials to support subject matter. The instructor gives an explanation and review of diabetes co-morbidities, pre- and post-prandial blood glucose goals, pre-exercise blood glucose goals, signs, symptoms, and  treatment of hypoglycemia and hyperglycemia, and foot care basics.   Diabetes Blitz:  -Group instruction provided by PowerPoint slides, verbal discussion, and written materials to support subject matter. The instructor gives an explanation and review of the physiology behind type 1 and type 2 diabetes, diabetes medications and rational behind using different medications, pre- and post-prandial blood glucose recommendations and Hemoglobin A1c goals, diabetes diet, and exercise including blood glucose guidelines for exercising safely.    Portion Distortion:  -Group instruction provided by PowerPoint slides, verbal discussion, written materials, and food models to support subject matter. The instructor gives an explanation of serving size versus portion size, changes in portions sizes over the last 20 years, and what consists of a serving from each food group.   Stress Management:  -Group instruction provided by verbal instruction, video, and written materials to support subject matter.  Instructors review role of stress in heart disease and how to cope with stress positively.     Exercising on Your Own:  -Group instruction provided by verbal instruction, power point, and written materials to support subject.  Instructors discuss benefits of exercise, components of exercise, frequency and intensity of exercise, and end points for exercise.  Also discuss use of nitroglycerin and activating EMS.  Review options of places to exercise outside of rehab.  Review guidelines for sex with heart disease.   Cardiac Drugs I:  -Group instruction provided by verbal instruction and written materials to support subject.  Instructor reviews cardiac drug classes: antiplatelets, anticoagulants, beta blockers, and statins.  Instructor discusses reasons, side effects, and lifestyle considerations for each drug class.   Cardiac Drugs II:  -Group instruction provided by verbal instruction and written materials to  support subject.  Instructor reviews cardiac drug classes: angiotensin converting enzyme inhibitors (ACE-I), angiotensin II receptor blockers (ARBs), nitrates, and calcium channel blockers.  Instructor discusses reasons, side effects, and lifestyle considerations for each drug class.   Anatomy and Physiology of the Circulatory System:  Group verbal and written instruction and models  provide basic cardiac anatomy and physiology, with the coronary electrical and arterial systems. Review of: AMI, Angina, Valve disease, Heart Failure, Peripheral Artery Disease, Cardiac Arrhythmia, Pacemakers, and the ICD.   Other Education:  -Group or individual verbal, written, or video instructions that support the educational goals of the cardiac rehab program.   Holiday Eating Survival Tips:  -Group instruction provided by PowerPoint slides, verbal discussion, and written materials to support subject matter. The instructor gives patients tips, tricks, and techniques to help them not only survive but enjoy the holidays despite the onslaught of food that accompanies the holidays.   Knowledge Questionnaire Score: Knowledge Questionnaire Score - 06/09/18 1145      Knowledge Questionnaire Score   Pre Score  21/24       Core Components/Risk Factors/Patient Goals at Admission: Personal Goals and Risk Factors at Admission - 06/09/18 1139      Core Components/Risk Factors/Patient Goals on Admission    Weight Management  Yes;Weight Maintenance    Intervention  Weight Management: Develop a combined nutrition and exercise program designed to reach desired caloric intake, while maintaining appropriate intake of nutrient and fiber, sodium and fats, and appropriate energy expenditure required for the weight goal.;Weight Management: Provide education and appropriate resources to help participant work on and attain dietary goals.;Weight Management/Obesity: Establish reasonable short term and long term weight goals.     Admit Weight  135 lb 9.3 oz (61.5 kg)    Goal Weight: Long Term  135 lb (61.2 kg)    Expected Outcomes  Short Term: Continue to assess and modify interventions until short term weight is achieved;Weight Maintenance: Understanding of the daily nutrition guidelines, which includes 25-35% calories from fat, 7% or less cal from saturated fats, less than 200mg  cholesterol, less than 1.5gm of sodium, & 5 or more servings of fruits and vegetables daily;Understanding recommendations for meals to include 15-35% energy as protein, 25-35% energy from fat, 35-60% energy from carbohydrates, less than 200mg  of dietary cholesterol, 20-35 gm of total fiber daily;Understanding of distribution of calorie intake throughout the day with the consumption of 4-5 meals/snacks    Hypertension  Yes    Intervention  Provide education on lifestyle modifcations including regular physical activity/exercise, weight management, moderate sodium restriction and increased consumption of fresh fruit, vegetables, and low fat dairy, alcohol moderation, and smoking cessation.;Monitor prescription use compliance.    Expected Outcomes  Short Term: Continued assessment and intervention until BP is < 140/38mm HG in hypertensive participants. < 130/49mm HG in hypertensive participants with diabetes, heart failure or chronic kidney disease.;Long Term: Maintenance of blood pressure at goal levels.    Lipids  Yes    Intervention  Provide education and support for participant on nutrition & aerobic/resistive exercise along with prescribed medications to achieve LDL 70mg , HDL >40mg .    Expected Outcomes  Long Term: Cholesterol controlled with medications as prescribed, with individualized exercise RX and with personalized nutrition plan. Value goals: LDL < 70mg , HDL > 40 mg.;Short Term: Participant states understanding of desired cholesterol values and is compliant with medications prescribed. Participant is following exercise prescription and nutrition  guidelines.    Stress  Yes    Intervention  Offer individual and/or small group education and counseling on adjustment to heart disease, stress management and health-related lifestyle change. Teach and support self-help strategies.;Refer participants experiencing significant psychosocial distress to appropriate mental health specialists for further evaluation and treatment. When possible, include family members and significant others in education/counseling sessions.    Expected Outcomes  Long Term: Emotional wellbeing is indicated by absence of clinically significant psychosocial distress or social isolation.;Short Term: Participant demonstrates changes in health-related behavior, relaxation and other stress management skills, ability to obtain effective social support, and compliance with psychotropic medications if prescribed.       Core Components/Risk Factors/Patient Goals Review:    Core Components/Risk Factors/Patient Goals at Discharge (Final Review):    ITP Comments: ITP Comments    Row Name 06/09/18 0850           ITP Comments  Dr. Armanda Magic, Medical Director           Comments:  Pt started cardiac rehab today.  Pt tolerated light exercise without difficulty. VSS, telemetry- SR with no ectopy, asymptomatic.  Medication list reconciled. Pt denies barriers to medication compliance.  PSYCHOSOCIAL ASSESSMENT:  PHQ-0. Pt exhibits positive coping skills, hopeful outlook with supportive family. No psychosocial needs identified at this time, no psychosocial interventions necessary.    Pt enjoys singing.  Pt desires to get stronger and increase stamina so that he can get his health in order to go back to school.   Pt oriented to exercise equipment and routine.    Understanding verbalized. Alanson Aly, BSN Cardiac and Emergency planning/management officer

## 2018-06-14 ENCOUNTER — Other Ambulatory Visit: Payer: Self-pay | Admitting: Internal Medicine

## 2018-06-15 ENCOUNTER — Encounter (HOSPITAL_COMMUNITY)
Admission: RE | Admit: 2018-06-15 | Discharge: 2018-06-15 | Disposition: A | Discharge status: Home / Self Care | Payer: Medicare Other | Source: Ambulatory Visit | Attending: Cardiovascular Disease | Admitting: Cardiovascular Disease

## 2018-06-15 ENCOUNTER — Encounter (HOSPITAL_COMMUNITY): Payer: Medicare Other

## 2018-06-15 DIAGNOSIS — I213 ST elevation (STEMI) myocardial infarction of unspecified site: Secondary | ICD-10-CM

## 2018-06-15 DIAGNOSIS — I2111 ST elevation (STEMI) myocardial infarction involving right coronary artery: Secondary | ICD-10-CM | POA: Diagnosis not present

## 2018-06-15 DIAGNOSIS — Z48812 Encounter for surgical aftercare following surgery on the circulatory system: Secondary | ICD-10-CM | POA: Diagnosis not present

## 2018-06-15 DIAGNOSIS — Z955 Presence of coronary angioplasty implant and graft: Secondary | ICD-10-CM

## 2018-06-17 ENCOUNTER — Encounter (HOSPITAL_COMMUNITY)
Admission: RE | Admit: 2018-06-17 | Discharge: 2018-06-17 | Disposition: A | Discharge status: Home / Self Care | Payer: Medicare Other | Source: Ambulatory Visit | Attending: Cardiovascular Disease | Admitting: Cardiovascular Disease

## 2018-06-17 ENCOUNTER — Encounter (HOSPITAL_COMMUNITY): Payer: Medicare Other

## 2018-06-17 DIAGNOSIS — Z48812 Encounter for surgical aftercare following surgery on the circulatory system: Secondary | ICD-10-CM | POA: Diagnosis not present

## 2018-06-17 DIAGNOSIS — I213 ST elevation (STEMI) myocardial infarction of unspecified site: Secondary | ICD-10-CM

## 2018-06-17 DIAGNOSIS — I2111 ST elevation (STEMI) myocardial infarction involving right coronary artery: Secondary | ICD-10-CM | POA: Diagnosis not present

## 2018-06-17 DIAGNOSIS — Z955 Presence of coronary angioplasty implant and graft: Secondary | ICD-10-CM | POA: Diagnosis not present

## 2018-06-20 ENCOUNTER — Encounter (HOSPITAL_COMMUNITY): Payer: Medicare Other

## 2018-06-20 ENCOUNTER — Encounter (HOSPITAL_COMMUNITY)
Admission: RE | Admit: 2018-06-20 | Discharge: 2018-06-20 | Disposition: A | Discharge status: Home / Self Care | Payer: Medicare Other | Source: Ambulatory Visit | Attending: Cardiovascular Disease | Admitting: Cardiovascular Disease

## 2018-06-20 DIAGNOSIS — Z48812 Encounter for surgical aftercare following surgery on the circulatory system: Secondary | ICD-10-CM | POA: Diagnosis not present

## 2018-06-20 DIAGNOSIS — I2111 ST elevation (STEMI) myocardial infarction involving right coronary artery: Secondary | ICD-10-CM | POA: Diagnosis not present

## 2018-06-20 DIAGNOSIS — Z955 Presence of coronary angioplasty implant and graft: Secondary | ICD-10-CM | POA: Diagnosis not present

## 2018-06-20 NOTE — Progress Notes (Signed)
Reviewed home exercise with pt today.  Pt plans to walk for exercise, in addition to coming to cardiac rehab.  Reviewed THR, pulse, RPE, sign and symptoms, NTG use, and when to call 911 or MD.  Also discussed weather considerations and indoor options.  Pt voiced understanding.    Zeshan Sena [employer]

## 2018-06-21 ENCOUNTER — Encounter (INDEPENDENT_AMBULATORY_CARE_PROVIDER_SITE_OTHER): Payer: Self-pay | Admitting: Orthopaedic Surgery

## 2018-06-21 ENCOUNTER — Ambulatory Visit (INDEPENDENT_AMBULATORY_CARE_PROVIDER_SITE_OTHER): Payer: Medicare Other | Admitting: Orthopaedic Surgery

## 2018-06-21 DIAGNOSIS — I255 Ischemic cardiomyopathy: Secondary | ICD-10-CM | POA: Diagnosis not present

## 2018-06-21 DIAGNOSIS — M879 Osteonecrosis, unspecified: Secondary | ICD-10-CM | POA: Diagnosis not present

## 2018-06-21 NOTE — Progress Notes (Addendum)
Office Visit Note   Patient: Rodney Walker           Date of Birth: 01-Mar-1981           MRN: [id number] Visit Date: 06/21/2018              Requested by: No referring provider defined for this encounter. PCP: Patient, No Pcp Per   Assessment & Plan: Visit Diagnoses:  1. Bilateral hip osteonecrosis (HCC)     Plan: MRI findings are consistent with bilateral hip osteonecrosis worse on the right with significant edema of the proximal femur and mild flattening of the right femoral head.  There is multiple cysts patchy sclerotic areas of the femoral head.  We discussed at length continued symptomatic treatment with Tylenol versus core decompression versus total hip replacement and their associated risks and benefits.  He will think about this and let us know how he wants to proceed. Of note he recently had his second heart attack in March. Total face to face encounter time was greater than 25 minutes and over half of this time was spent in counseling and/or coordination of care.  Follow-Up Instructions: Return if symptoms worsen or fail to improve.   Orders:  No orders of the defined types were placed in this encounter.  No orders of the defined types were placed in this encounter.     Procedures: No procedures performed   Clinical Data: No additional findings.   Subjective: Chief Complaint  Patient presents with  . Right Hip - Pain    [name] follows up today for MRI review.  He takes Tylenol daily for the hip pain.  He has stopped smoking cigarettes.   Review of Systems   Objective: Vital Signs: There were no vitals taken for this visit.  Physical Exam  Ortho Exam Hip exam is stable. Specialty Comments:  No specialty comments available.  Imaging: No results found.   PMFS History: Patient Active Problem List   Diagnosis Date Noted  . Hyperlipidemia 04/05/2018  . Ischemic cardiomyopathy 04/05/2018  . History of acute inferior wall MI  03/24/2018  . Marijuana abuse, continuous 10/29/2016  . Moderate persistent asthma without complication 10/29/2016  . Allergic rhinitis 10/08/2016  . Anemia 05/03/2015  . Lung abscess (HCC) 04/22/2015  . Sepsis due to pneumonia (HCC) 04/17/2015  . Bullous emphysema (HCC)   . Lobar pneumonia (HCC)   . Empyema lung (HCC) 04/16/2015  . HIV (human immunodeficiency virus infection) (HCC)   . Tobacco abuse 03/21/2015  . Lung mass 08/31/2014  . 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 Myalgia 04/23/2014  . AIDS (HCC) 03/22/2014  . CAP (community acquired pneumonia) 03/22/2014  . CAD (coronary artery disease) 11/13/2011   Past Medical History:  Diagnosis Date  . COPD (chronic obstructive pulmonary disease) (HCC)   . Coronary artery disease    MI 2010 - dissection/plaque rupture in LAD tx with antiplt/antithrombotics >> relook cath with resolution (no PCI performed) // Inf STEMI 3/19: OM1 30; pRCA 100 >> DES // Echo 3/19:  mild LVH, EF 40-45, inf/inf-sept HK, trivial MR  . Eczema   . History of myocardial infarction 2010; 2019  . History of pleural empyema   . HIV infection (HCC)   . Hyperlipidemia   . Immune deficiency disorder (HCC)   . Ischemic cardiomyopathy 04/05/2018  . Moderate persistent asthma without complication 10/29/2016    Family History  Problem Relation Age of Onset  . Diabetes Mother   . Eczema Mother   .  Asthma Mother   . COPD Mother   . Hypertension Mother   . Hyperlipidemia Mother   . Stroke Mother   . Heart disease Mother   . Sleep apnea Mother   . Liver cancer Father   . Asthma Brother     Past Surgical History:  Procedure Laterality Date  . CARDIAC CATHETERIZATION  06/24/2009   EF 60%  . CARDIAC CATHETERIZATION  06/17/2009   EF 45%. ANTERIOR HYPOKINESIS  . CORONARY STENT INTERVENTION N/A 03/24/2018   Procedure: CORONARY STENT INTERVENTION;  Surgeon: Tonny Bollman, MD;  Location: [facility] INVASIVE CV LAB;  Service: Cardiovascular;  Laterality: N/A;  .  CORONARY/GRAFT ACUTE MI REVASCULARIZATION N/A 03/24/2018   Procedure: Coronary/Graft Acute MI Revascularization;  Surgeon: Tonny Bollman, MD;  Location: [facility] INVASIVE CV LAB;  Service: Cardiovascular;  Laterality: N/A;  . LEFT HEART CATH AND CORONARY ANGIOGRAPHY N/A 03/24/2018   Procedure: LEFT HEART CATH AND CORONARY ANGIOGRAPHY;  Surgeon: Tonny Bollman, MD;  Location: [facility] INVASIVE CV LAB;  Service: Cardiovascular;  Laterality: N/A;  . US ECHOCARDIOGRAPHY  12/09/2009   EF 55-60%  . VIDEO ASSISTED THORACOSCOPY (VATS)/ LOBECTOMY Right 04/23/2015   Procedure: VIDEO ASSISTED THORACOSCOPY (VATS)/RIGHT upper  and right middle LOBECTOMY;  Surgeon: Kerin Perna, MD;  Location: [facility] OR;  Service: Thoracic;  Laterality: Right;  Marland Kitchen VIDEO BRONCHOSCOPY Bilateral 09/05/2014   Procedure: VIDEO BRONCHOSCOPY WITH FLUORO;  Surgeon: Merwyn Katos, MD;  Location: [facility] ENDOSCOPY;  Service: Cardiopulmonary;  Laterality: Bilateral;  . VIDEO BRONCHOSCOPY Bilateral 04/10/2015   Procedure: VIDEO BRONCHOSCOPY WITH FLUORO;  Surgeon: Lupita Leash, MD;  Location: [facility] ENDOSCOPY;  Service: Cardiopulmonary;  Laterality: Bilateral;   Social History   Occupational History  . Occupation: unemployed  Tobacco Use  . Smoking status: Former Smoker    Packs/day: 0.25    Years: 17.00    Pack years: 4.25    Types: Cigarettes    Last attempt to quit: 03/23/2015    Years since quitting: 3.2  . Smokeless tobacco: Never Used  Substance and Sexual Activity  . Alcohol use: No    Alcohol/week: 0.0 oz  . Drug use: Yes    Frequency: 2.0 times per week    Types: Marijuana  . Sexual activity: Not Currently    Partners: Male    Comment: given condoms

## 2018-06-22 ENCOUNTER — Encounter (HOSPITAL_COMMUNITY)
Admission: RE | Admit: 2018-06-22 | Discharge: 2018-06-22 | Disposition: A | Discharge status: Home / Self Care | Payer: Medicare Other | Source: Ambulatory Visit | Attending: Cardiovascular Disease | Admitting: Cardiovascular Disease

## 2018-06-22 ENCOUNTER — Encounter (HOSPITAL_COMMUNITY): Payer: Medicare Other

## 2018-06-22 DIAGNOSIS — I2111 ST elevation (STEMI) myocardial infarction involving right coronary artery: Secondary | ICD-10-CM | POA: Diagnosis not present

## 2018-06-22 DIAGNOSIS — Z955 Presence of coronary angioplasty implant and graft: Secondary | ICD-10-CM | POA: Diagnosis not present

## 2018-06-22 DIAGNOSIS — Z48812 Encounter for surgical aftercare following surgery on the circulatory system: Secondary | ICD-10-CM | POA: Diagnosis not present

## 2018-06-24 ENCOUNTER — Encounter (HOSPITAL_COMMUNITY): Payer: Medicare Other

## 2018-06-24 ENCOUNTER — Encounter (HOSPITAL_COMMUNITY)
Admission: RE | Admit: 2018-06-24 | Discharge: 2018-06-24 | Disposition: A | Discharge status: Home / Self Care | Payer: Medicare Other | Source: Ambulatory Visit | Attending: Cardiovascular Disease | Admitting: Cardiovascular Disease

## 2018-06-24 DIAGNOSIS — I2111 ST elevation (STEMI) myocardial infarction involving right coronary artery: Secondary | ICD-10-CM | POA: Diagnosis not present

## 2018-06-24 DIAGNOSIS — Z955 Presence of coronary angioplasty implant and graft: Secondary | ICD-10-CM | POA: Diagnosis not present

## 2018-06-24 DIAGNOSIS — I213 ST elevation (STEMI) myocardial infarction of unspecified site: Secondary | ICD-10-CM

## 2018-06-24 DIAGNOSIS — Z48812 Encounter for surgical aftercare following surgery on the circulatory system: Secondary | ICD-10-CM | POA: Diagnosis not present

## 2018-06-27 ENCOUNTER — Encounter (HOSPITAL_COMMUNITY): Payer: Medicare Other

## 2018-06-27 ENCOUNTER — Encounter (HOSPITAL_COMMUNITY)
Admission: RE | Admit: 2018-06-27 | Discharge: 2018-06-27 | Disposition: A | Discharge status: Home / Self Care | Payer: Medicare Other | Source: Ambulatory Visit | Attending: Cardiovascular Disease | Admitting: Cardiovascular Disease

## 2018-06-27 VITALS — Wt 134.5 lb

## 2018-06-27 DIAGNOSIS — I2111 ST elevation (STEMI) myocardial infarction involving right coronary artery: Secondary | ICD-10-CM | POA: Insufficient documentation

## 2018-06-27 DIAGNOSIS — Z955 Presence of coronary angioplasty implant and graft: Secondary | ICD-10-CM | POA: Insufficient documentation

## 2018-06-27 DIAGNOSIS — I213 ST elevation (STEMI) myocardial infarction of unspecified site: Secondary | ICD-10-CM

## 2018-06-27 DIAGNOSIS — Z48812 Encounter for surgical aftercare following surgery on the circulatory system: Secondary | ICD-10-CM | POA: Diagnosis not present

## 2018-06-27 NOTE — Progress Notes (Signed)
Rodney Walker 37 y.o. male Nutrition Note Spoke with pt. Nutrition Plan and Nutrition Survey goals reviewed with pt. Pt is following a Heart Healthy diet. As pt eats out frequently discussed how to navigate eating out while following a heart healthy diet. Additionally discussed quick and easy meal ideas with patient that would be heart healthy but require a lower amount of prep work. Recommended some lower sodium products that pt can use to increase vegetable consumption daily. Pt expressed understanding of the information reviewed. Pt aware of nutrition education classes offered.  Lab Results  Component Value Date   HGBA1C 5.0 03/24/2018    Wt Readings from Last 3 Encounters:  06/09/18 135 lb 9.3 oz (61.5 kg)  05/30/18 134 lb 3.2 oz (60.9 kg)  05/05/18 134 lb 12.8 oz (61.1 kg)    Nutrition Diagnosis ? Food-and nutrition-related knowledge deficit related to lack of exposure to information as related to diagnosis of: ? CVD  Nutrition Intervention ? Pt's individual nutrition plan reviewed with pt. ? Benefits of adopting Heart Healthy diet discussed when Medficts reviewed.    Goal(s)  Pt to identify and limit food sources of saturated fat, trans fat, and sodium  Pt to use healthy seasonings for vegetables and salads.  Pt to make good choices when eating out at restaurants.  Plan:  Pt to attend nutrition classes ? Nutrition I ? Nutrition II ? Portion Distortion  Will provide client-centered nutrition education as part of interdisciplinary care.   Monitor and evaluate progress toward nutrition goal with team.   Ross Marcus, MS, RD, LDN 06/27/2018 9:31 AM

## 2018-06-29 ENCOUNTER — Ambulatory Visit: Payer: Medicare Other | Admitting: Registered"

## 2018-06-29 ENCOUNTER — Encounter (HOSPITAL_COMMUNITY): Payer: Medicare Other

## 2018-06-29 ENCOUNTER — Encounter (HOSPITAL_COMMUNITY)
Admission: RE | Admit: 2018-06-29 | Discharge: 2018-06-29 | Disposition: A | Discharge status: Home / Self Care | Payer: Medicare Other | Source: Ambulatory Visit | Attending: Cardiovascular Disease | Admitting: Cardiovascular Disease

## 2018-06-29 DIAGNOSIS — I2111 ST elevation (STEMI) myocardial infarction involving right coronary artery: Secondary | ICD-10-CM | POA: Diagnosis not present

## 2018-06-29 DIAGNOSIS — Z955 Presence of coronary angioplasty implant and graft: Secondary | ICD-10-CM

## 2018-06-29 DIAGNOSIS — Z48812 Encounter for surgical aftercare following surgery on the circulatory system: Secondary | ICD-10-CM | POA: Diagnosis not present

## 2018-06-29 DIAGNOSIS — I213 ST elevation (STEMI) myocardial infarction of unspecified site: Secondary | ICD-10-CM

## 2018-07-01 ENCOUNTER — Encounter (HOSPITAL_COMMUNITY): Payer: Medicare Other

## 2018-07-01 ENCOUNTER — Telehealth (HOSPITAL_COMMUNITY): Payer: Self-pay | Admitting: General Practice

## 2018-07-01 NOTE — Telephone Encounter (Signed)
Pt woke up with nausea, vomiting and diarrhea. Pt will remain out of program until symptoms resolved. 48 hours. Understanding verbalized. Deveron Furlong, RN, BSN Cardiac Pulmonary Rehab

## 2018-07-04 ENCOUNTER — Encounter (HOSPITAL_COMMUNITY)
Admission: RE | Admit: 2018-07-04 | Discharge: 2018-07-04 | Disposition: A | Discharge status: Home / Self Care | Payer: Medicare Other | Source: Ambulatory Visit | Attending: Cardiovascular Disease | Admitting: Cardiovascular Disease

## 2018-07-04 ENCOUNTER — Encounter (HOSPITAL_COMMUNITY): Payer: Medicare Other

## 2018-07-04 DIAGNOSIS — I213 ST elevation (STEMI) myocardial infarction of unspecified site: Secondary | ICD-10-CM

## 2018-07-04 DIAGNOSIS — Z955 Presence of coronary angioplasty implant and graft: Secondary | ICD-10-CM | POA: Diagnosis not present

## 2018-07-04 DIAGNOSIS — I2111 ST elevation (STEMI) myocardial infarction involving right coronary artery: Secondary | ICD-10-CM | POA: Diagnosis not present

## 2018-07-04 DIAGNOSIS — Z48812 Encounter for surgical aftercare following surgery on the circulatory system: Secondary | ICD-10-CM | POA: Diagnosis not present

## 2018-07-06 ENCOUNTER — Encounter (HOSPITAL_COMMUNITY): Payer: Medicare Other

## 2018-07-06 ENCOUNTER — Encounter (HOSPITAL_COMMUNITY)
Admission: RE | Admit: 2018-07-06 | Discharge: 2018-07-06 | Disposition: A | Discharge status: Home / Self Care | Payer: Medicare Other | Source: Ambulatory Visit | Attending: Cardiovascular Disease | Admitting: Cardiovascular Disease

## 2018-07-06 DIAGNOSIS — I2111 ST elevation (STEMI) myocardial infarction involving right coronary artery: Secondary | ICD-10-CM | POA: Diagnosis not present

## 2018-07-06 DIAGNOSIS — Z955 Presence of coronary angioplasty implant and graft: Secondary | ICD-10-CM | POA: Diagnosis not present

## 2018-07-06 DIAGNOSIS — Z48812 Encounter for surgical aftercare following surgery on the circulatory system: Secondary | ICD-10-CM | POA: Diagnosis not present

## 2018-07-06 DIAGNOSIS — I213 ST elevation (STEMI) myocardial infarction of unspecified site: Secondary | ICD-10-CM

## 2018-07-07 ENCOUNTER — Encounter (HOSPITAL_COMMUNITY): Payer: Self-pay

## 2018-07-07 NOTE — Progress Notes (Signed)
Cardiac Individual Treatment Plan  Patient Details  Name: Rodney Walker MRN: [id number] Date of Birth: 1981/08/08 Referring Provider:   Flowsheet Row CARDIAC REHAB PHASE II ORIENTATION from 06/09/2018 in [facility] CARDIAC [facility]  Referring Provider  [name], [name] MD       Initial Encounter Date:  Flowsheet Row CARDIAC REHAB PHASE II ORIENTATION from 06/09/2018 in [facility] CARDIAC REHAB  Date  06/09/18      Visit Diagnosis: Stented coronary artery  ST elevation myocardial infarction (STEMI), unspecified artery (HCC)  Patient's Home Medications on Admission:  Current Outpatient Medications:  .  albuterol (PROVENTIL HFA;VENTOLIN HFA) 108 (90 Base) MCG/ACT inhaler, Inhale 4 puffs into the lungs every 4 (four) hours as needed for wheezing or shortness of breath. (Patient not taking: Reported on 05/31/2018), Disp: 1 each, Rfl: 2 .  aspirin 81 MG EC tablet, Take 1 tablet (81 mg total) by mouth daily., Disp: 90 tablet, Rfl: 3 .  atorvastatin (LIPITOR) 40 MG tablet, Take 1 tablet (40 mg total) by mouth daily at 6 PM., Disp: 90 tablet, Rfl: 3 .  BIKTARVY 50-200-25 MG TABS tablet, TAKE 1 TABLET BY MOUTH DAILY, Disp: 30 tablet, Rfl: 5 .  cetirizine (ZYRTEC) 10 MG tablet, TAKE 1 TABLET(10 MG) BY MOUTH DAILY, Disp: 30 tablet, Rfl: 5 .  darunavir-cobicistat (PREZCOBIX) 800-150 MG tablet, Take 1 tablet daily with breakfast by mouth. Swallow whole. Do NOT crush, break or chew tablets. Take with food., Disp: 30 tablet, Rfl: 5 .  diclofenac sodium (VOLTAREN) 1 % GEL, Apply 2 g topically 4 (four) times daily., Disp: 100 g, Rfl: 0 .  lisinopril (PRINIVIL,ZESTRIL) 2.5 MG tablet, Take 1 tablet (2.5 mg total) by mouth daily., Disp: 90 tablet, Rfl: 3 .  methocarbamol (ROBAXIN) 500 MG tablet, Take 1 tablet (500 mg total) by mouth at bedtime as needed for muscle spasms. (Patient not taking: Reported on 05/31/2018), Disp: 20 tablet, Rfl: 0 .  metoprolol tartrate  (LOPRESSOR) 25 MG tablet, Take 0.5 tablets (12.5 mg total) by mouth 2 (two) times daily., Disp: 60 tablet, Rfl: 6 .  nitroGLYCERIN (NITROSTAT) 0.4 MG SL tablet, Place 1 tablet (0.4 mg total) under the tongue every 5 (five) minutes x 3 doses as needed for chest pain. (Patient not taking: Reported on 06/13/2018), Disp: 25 tablet, Rfl: 2  Past Medical History: Past Medical History:  Diagnosis Date  . COPD (chronic obstructive pulmonary disease) (HCC)   . Coronary artery disease    MI 2010 - dissection/plaque rupture in LAD tx with antiplt/antithrombotics >> relook cath with resolution (no PCI performed) // Inf STEMI 3/19: OM1 30; pRCA 100 >> DES // Echo 3/19:  mild LVH, EF 40-45, inf/inf-sept HK, trivial MR  . Eczema   . History of myocardial infarction 2010; 2019  . History of pleural empyema   . HIV infection (HCC)   . Hyperlipidemia   . Immune deficiency disorder (HCC)   . Ischemic cardiomyopathy 04/05/2018  . Moderate persistent asthma without complication 10/29/2016    Tobacco Use: Social History   Tobacco Use  Smoking Status Former Smoker  . Packs/day: 0.25  . Years: 17.00  . Pack years: 4.25  . Types: Cigarettes  . Last attempt to quit: 03/23/2015  . Years since quitting: 3.2  Smokeless Tobacco Never Used    Labs: Recent Review Advice worker    Labs for ITP Cardiac and Pulmonary Rehab Latest Ref Rng & Units 03/03/2016 04/13/2016 02/12/2017 03/24/2018 03/25/2018   Cholestrol 0 -  200 mg/dL - 454 098 119 147   LDLCALC 0 - 99 mg/dL - 829 562(Z) 308(M) 95   HDL >40 mg/dL - 41 44 45 57(Q)   Trlycerides <150 mg/dL - 80 77 469 629   Hemoglobin A1c 4.8 - 5.6 % 5.2 - - 5.0 -   PHART 7.350 - 7.450 - - - - -   PCO2ART 35.0 - 45.0 mmHg - - - - -   HCO3 20.0 - 24.0 mEq/L - - - - -   TCO2 0 - 100 mmol/L - - - - -   O2SAT % - - - - -      Capillary Blood Glucose: Lab Results  Component Value Date   GLUCAP 106 (H) 03/25/2018   GLUCAP 93 03/25/2018   GLUCAP 109 (H) 03/25/2018   GLUCAP  99 06/19/2009     Exercise Target Goals:    Exercise Program Goal: Individual exercise prescription set using results from initial 6 min walk test and THRR while considering  patient's activity barriers and safety.   Exercise Prescription Goal: Initial exercise prescription builds to 30-45 minutes a day of aerobic activity, 2-3 days per week.  Home exercise guidelines will be given to patient during program as part of exercise prescription that the participant will acknowledge.  Activity Barriers & Risk Stratification: Activity Barriers & Cardiac Risk Stratification - 06/09/18 0909    Activity Barriers & Cardiac Risk Stratification          Activity Barriers  Other (comment)    Comments  Chronic Right Hip Pain    Cardiac Risk Stratification  High           6 Minute Walk: 6 Minute Walk    6 Minute Walk    Row Name 06/09/18 1143   Phase  Initial   Distance  1743 feet   Walk Time  3 minutes   # of Rest Breaks  0   MPH  3.3   METS  5.67   RPE  7   Perceived Dyspnea   0   VO2 Peak  19.84   Symptoms  No   Resting HR  70 bpm   Resting BP  104/60   Resting Oxygen Saturation   97 %   Exercise Oxygen Saturation  during 6 min walk  98 %   Max Ex. HR  103 bpm   Max Ex. BP  118/72   2 Minute Post BP  102/58          Oxygen Initial Assessment:   Oxygen Re-Evaluation:   Oxygen Discharge (Final Oxygen Re-Evaluation):   Initial Exercise Prescription: Initial Exercise Prescription - 06/09/18 1100    Date of Initial Exercise RX and Referring Provider          Date  06/09/18    Referring Provider  [name], [name] MD         Treadmill          MPH  2.5    Grade  1    Minutes  10    METs  3.26        Bike          Level  1    Minutes  10    METs  4.14        NuStep          Level  3    SPM  75    Minutes  10    METs  3.5  Prescription Details          Frequency (times per week)  3x    Duration  Progress to 30 minutes of continuous aerobic  without signs/symptoms of physical distress        Intensity          THRR 40-80% of Max Heartrate  73-146    Ratings of Perceived Exertion  11-13    Perceived Dyspnea  0-4        Progression          Progression  Continue progressive overload as per policy without signs/symptoms or physical distress.        Resistance Training          Training Prescription  Yes    Weight  5lb    Reps  10-15           Perform Capillary Blood Glucose checks as needed.  Exercise Prescription Changes: Exercise Prescription Changes    Response to Exercise    Row Name 06/13/18 1600 06/27/18 1331   Blood Pressure (Admit)  108/60  114/62   Blood Pressure (Exercise)  160/86  160/70   Blood Pressure (Exit)  102/78  124/70   Heart Rate (Admit)  80 bpm  76 bpm   Heart Rate (Exercise)  143 bpm  138 bpm   Heart Rate (Exit)  77 bpm  76 bpm   Rating of Perceived Exertion (Exercise)  11  13   Perceived Dyspnea (Exercise)  0  0   Symptoms  None   None    Comments  Pt oriented to exercise equipment   no documentation   Duration  Progress to 30 minutes of  aerobic without signs/symptoms of physical distress  Progress to 30 minutes of  aerobic without signs/symptoms of physical distress   Intensity  THRR unchanged  THRR unchanged       Progression    Row Name 06/13/18 1600 06/27/18 1331   Progression  Continue to progress workloads to maintain intensity without signs/symptoms of physical distress.  Continue to progress workloads to maintain intensity without signs/symptoms of physical distress.   Average METs  3.9  7.2       Resistance Training    Row Name 06/13/18 1600 06/27/18 1331   Training Prescription  Yes  Yes   Weight  5lbs  6lbs   Reps  10-15  10-15   Time  10 Minutes  10 Minutes       Interval Training    Row Name 06/13/18 1600 06/27/18 1331   Interval Training  No  No       Treadmill    Row Name 06/13/18 1600 06/27/18 1331   MPH  2.7  3.5 jogging pace 5.2/0 for 4 min/ walking  for 6 min total   Grade  1  4   Minutes  10  10   METs  4.07  6.9       Bike    Row Name 06/13/18 1600 06/27/18 1331   Level  1  no documentation   Minutes  10  no documentation   METs  4.2  no documentation       NuStep    Row Name 06/13/18 1600 06/27/18 1331   Level  3  4   SPM  85  120   Minutes  10  10   METs  4.2  6.6       Rower    Row Name 06/13/18 1600 06/27/18 1331  Level  no documentation  4   Watts  no documentation  67   Minutes  no documentation  10   METs  no documentation  8       Home Exercise Plan    Row Name 06/13/18 1600 06/27/18 1331   Plans to continue exercise at  no documentation  Home (comment)   Frequency  no documentation  Add 2 additional days to program exercise sessions.   Initial Home Exercises Provided  no documentation  06/20/18          Exercise Comments: Exercise Comments    Row Name 06/13/18 1634 06/28/18 1339 07/06/18 1159   Exercise Comments  Pt off to a great start with exercise. Pt oriented to exercise equipment. Will continue to monitor and progress pt as tolerated.   Reviewed METs and goals. Pt is tolerating exercise prescription very well. Cardiac rehab staff will continue to monitor pt's intensity and activity levels.   Reviewed METs and goals. Pt is tolerating exercise prescription very well. Cardiac rehab staff will continue to monitor pt's intensity and activity levels.       Exercise Goals and Review: Exercise Goals    Exercise Goals    Row Name 06/09/18 1144   Increase Physical Activity  Yes   Intervention  Develop an individualized exercise prescription for aerobic and resistive training based on initial evaluation findings, risk stratification, comorbidities and participant's personal goals.;Provide advice, education, support and counseling about physical activity/exercise needs.   Expected Outcomes  Long Term: Add in home exercise to make exercise part of routine and to increase amount of physical activity.;Short  Term: Attend rehab on a regular basis to increase amount of physical activity.;Long Term: Exercising regularly at least 3-5 days a week.   Increase Strength and Stamina  Yes   Intervention  Provide advice, education, support and counseling about physical activity/exercise needs.;Develop an individualized exercise prescription for aerobic and resistive training based on initial evaluation findings, risk stratification, comorbidities and participant's personal goals.   Expected Outcomes  Long Term: Improve cardiorespiratory fitness, muscular endurance and strength as measured by increased METs and functional capacity ( );Short Term: Perform resistance training exercises routinely during rehab and add in resistance training at home;Short Term: Increase workloads from initial exercise prescription for resistance, speed, and METs.   Able to understand and use rate of perceived exertion (RPE) scale  Yes   Intervention  Provide education and explanation on how to use RPE scale   Expected Outcomes  Short Term: Able to use RPE daily in rehab to express subjective intensity level;Long Term:  Able to use RPE to guide intensity level when exercising independently   Knowledge and understanding of Target Heart Rate Range (THRR)  Yes   Intervention  Provide education and explanation of THRR including how the numbers were predicted and where they are located for reference   Expected Outcomes  Short Term: Able to use daily as guideline for intensity in rehab;Long Term: Able to use THRR to govern intensity when exercising independently;Short Term: Able to state/look up THRR   Able to check pulse independently  Yes   Intervention  Provide education and demonstration on how to check pulse in carotid and radial arteries.;Review the importance of being able to check your own pulse for safety during independent exercise   Expected Outcomes  Short Term: Able to explain why pulse checking is important during independent  exercise;Long Term: Able to check pulse independently and accurately   Understanding of Exercise Prescription  Yes   Intervention  Provide education, explanation, and written materials on patient's individual exercise prescription   Expected Outcomes  Long Term: Able to explain home exercise prescription to exercise independently;Short Term: Able to explain program exercise prescription          Exercise Goals Re-Evaluation : Exercise Goals Re-Evaluation    Exercise Goal Re-Evaluation    Row Name 06/20/18 1151 07/06/18 1159   Exercise Goals Review  Increase Physical Activity;Knowledge and understanding of Target Heart Rate Range (THRR);Able to understand and use rate of perceived exertion (RPE) scale;Understanding of Exercise Prescription;Increase Strength and Stamina;Able to check pulse independently  Increase Physical Activity;Knowledge and understanding of Target Heart Rate Range (THRR);Able to understand and use rate of perceived exertion (RPE) scale;Understanding of Exercise Prescription;Increase Strength and Stamina;Able to check pulse independently   Comments  Reviewed home exercise with pt today.  Pt plans to walk for exercise, in addition to coming to cardiac rehab.  Discussed with patient indoor options for hot and humid days. Reviewed activity limitations and temperature precautions. Pt voiced understanding.  Pt is now jogging and doing the rower machine in cardiac rehab. Pt has noticed an increase in strength and stamina since starting cardiac rehab. Pt handles WL increases very well and enjoys the variety of exercises being offered in cardiac rehab.   Expected Outcomes  Pt will continue to improve in cardiorespiratory fitness by coming to cardiac rehab and walking in neighborhood   Pt will continue to improve in cardiorespiratory fitness by coming to cardiac rehab and walking/ jogging in neighborhood            Discharge Exercise Prescription (Final Exercise Prescription  Changes): Exercise Prescription Changes - 06/27/18 1331    Response to Exercise          Blood Pressure (Admit)  114/62    Blood Pressure (Exercise)  160/70    Blood Pressure (Exit)  124/70    Heart Rate (Admit)  76 bpm    Heart Rate (Exercise)  138 bpm    Heart Rate (Exit)  76 bpm    Rating of Perceived Exertion (Exercise)  13    Perceived Dyspnea (Exercise)  0    Symptoms  None     Duration  Progress to 30 minutes of  aerobic without signs/symptoms of physical distress    Intensity  THRR unchanged        Progression          Progression  Continue to progress workloads to maintain intensity without signs/symptoms of physical distress.    Average METs  7.2        Resistance Training          Training Prescription  Yes    Weight  6lbs    Reps  10-15    Time  10 Minutes        Interval Training          Interval Training  No        Treadmill          MPH  3.5 jogging pace 5.2/0 for 4 min/ walking for 6 min total    Grade  4    Minutes  10    METs  6.9        Bike          Level  --    Minutes  --    METs  --        NuStep  Level  4    SPM  120    Minutes  10    METs  6.6        Rower          Level  4    Watts  67    Minutes  10    METs  8        Home Exercise Plan          Plans to continue exercise at  Home (comment)    Frequency  Add 2 additional days to program exercise sessions.    Initial Home Exercises Provided  06/20/18           Nutrition:  Target Goals: Understanding of nutrition guidelines, daily intake of sodium 1500mg , cholesterol 200mg , calories 30% from fat and 7% or less from saturated fats, daily to have 5 or more servings of fruits and vegetables.  Biometrics: Pre Biometrics - 06/09/18 1144    Pre Biometrics          Height  5' 5.5" (1.664 m)    Weight  135 lb 9.3 oz (61.5 kg)    Waist Circumference  28 inches    Hip Circumference  30.5 inches    Waist to Hip Ratio  0.92 %    BMI (Calculated)  22.21     Triceps Skinfold  6 mm    % Body Fat  15.4 %    Grip Strength  48 kg    Flexibility  13 in    Single Leg Stand  30 seconds            Nutrition Therapy Plan and Nutrition Goals: Nutrition Therapy & Goals - 06/09/18 1245    Nutrition Therapy          Diet  heart healthy           Nutrition Assessments: Nutrition Assessments - 06/09/18 1245    MEDFICTS Scores          Pre Score  49           Nutrition Goals Re-Evaluation:   Nutrition Goals Re-Evaluation:   Nutrition Goals Discharge (Final Nutrition Goals Re-Evaluation):   Psychosocial: Target Goals: Acknowledge presence or absence of significant depression and/or stress, maximize coping skills, provide positive support system. Participant is able to verbalize types and ability to use techniques and skills needed for reducing stress and depression.  Initial Review & Psychosocial Screening: Initial Psych Review & Screening - 06/09/18 1052    Initial Review          Current issues with  Current Stress Concerns    Source of Stress Concerns  Transportation    Comments  Avyan inquired about bus vouchers.  Contact information given for Medicaid transportation help.        Family Dynamics          Good Support System?  Yes Avion lists his family and friends as his support system.        Barriers          Psychosocial barriers to participate in program  There are no identifiable barriers or psychosocial needs.        Screening Interventions          Interventions  Encouraged to exercise           Quality of Life Scores: Quality of Life - 06/09/18 1146    Quality of Life Scores          Health/Function Pre  27.2 %    Socioeconomic Pre  21 %    Psych/Spiritual Pre  28.29 %    Family Pre  27.6 %    GLOBAL Pre  26.06 %          Scores of 19 and below usually indicate a poorer quality of life in these areas.  A difference of  2-3 points is a clinically meaningful difference.  A difference of  2-3 points in the total score of the Quality of Life Index has been associated with significant improvement in overall quality of life, self-image, physical symptoms, and general health in studies assessing change in quality of life.  PHQ-9: Recent Review Flowsheet Data    Depression screen [facility] 2/9 06/13/2018 05/05/2018 04/27/2018 09/29/2017 06/29/2017   Decreased Interest 0 0 0 0 0   Down, Depressed, Hopeless 0 0 0 0 0   PHQ - 2 Score 0 0 0 0 0     Interpretation of Total Score  Total Score Depression Severity:  1-4 = Minimal depression, 5-9 = Mild depression, 10-14 = Moderate depression, 15-19 = Moderately severe depression, 20-27 = Severe depression   Psychosocial Evaluation and Intervention: Psychosocial Evaluation - 06/13/18 1500    Psychosocial Evaluation & Interventions          Interventions  Stress management education;Relaxation education;Encouraged to exercise with the program and follow exercise prescription    Comments  Pt with good support of family and freinds.  Pt generally feels hopeful about his future.    Expected Outcomes  Pt will continue to display positive and healthy coping skills.    Continue Psychosocial Services   Follow up required by staff           Psychosocial Re-Evaluation: Psychosocial Re-Evaluation    Psychosocial Re-Evaluation    Row Name 06/27/18 1526   Current issues with  None Identified   Comments  no psychosocial needs identified, no interventions necessary.    Expected Outcomes  pt will exhibit positive outlook with good coping skills.    Interventions  Encouraged to attend Cardiac Rehabilitation for the exercise   Continue Psychosocial Services   No Follow up required          Psychosocial Discharge (Final Psychosocial Re-Evaluation): Psychosocial Re-Evaluation - 06/27/18 1526    Psychosocial Re-Evaluation          Current issues with  None Identified    Comments  no psychosocial needs identified, no interventions necessary.      Expected Outcomes  pt will exhibit positive outlook with good coping skills.     Interventions  Encouraged to attend Cardiac Rehabilitation for the exercise    Continue Psychosocial Services   No Follow up required           Vocational Rehabilitation: Provide vocational rehab assistance to qualifying candidates.   Vocational Rehab Evaluation & Intervention: Vocational Rehab - 06/09/18 1052    Initial Vocational Rehab Evaluation & Intervention          Assessment shows need for Vocational Rehabilitation  No           Education: Education Goals: Education classes will be provided on a weekly basis, covering required topics. Participant will state understanding/return demonstration of topics presented.  Learning Barriers/Preferences: Learning Barriers/Preferences - 06/09/18 1145    Learning Barriers/Preferences          Learning Barriers  None    Learning Preferences  Written Material;Skilled Demonstration;Individual Instruction  Education Topics: Count Your Pulse:  -Group instruction provided by verbal instruction, demonstration, patient participation and written materials to support subject.  Instructors address importance of being able to find your pulse and how to count your pulse when at home without a heart monitor.  Patients get hands on experience counting their pulse with staff help and individually. Flowsheet Row CARDIAC REHAB PHASE II EXERCISE from 07/06/2018 in [facility] CARDIAC REHAB  Date  06/24/18  Instruction Review Code  2- Demonstrated Understanding      Heart Attack, Angina, and Risk Factor Modification:  -Group instruction provided by verbal instruction, video, and written materials to support subject.  Instructors address signs and symptoms of angina and heart attacks.    Also discuss risk factors for heart disease and how to make changes to improve heart health risk factors.   Functional Fitness:  -Group instruction  provided by verbal instruction, demonstration, patient participation, and written materials to support subject.  Instructors address safety measures for doing things around the house.  Discuss how to get up and down off the floor, how to pick things up properly, how to safely get out of a chair without assistance, and balance training.   Meditation and Mindfulness:  -Group instruction provided by verbal instruction, patient participation, and written materials to support subject.  Instructor addresses importance of mindfulness and meditation practice to help reduce stress and improve awareness.  Instructor also leads participants through a meditation exercise.    Stretching for Flexibility and Mobility:  -Group instruction provided by verbal instruction, patient participation, and written materials to support subject.  Instructors lead participants through series of stretches that are designed to increase flexibility thus improving mobility.  These stretches are additional exercise for major muscle groups that are typically performed during regular warm up and cool down.   Hands Only CPR:  -Group verbal, video, and participation provides a basic overview of AHA guidelines for community CPR. Role-play of emergencies allow participants the opportunity to practice calling for help and chest compression technique with discussion of AED use.   Hypertension: -Group verbal and written instruction that provides a basic overview of hypertension including the most recent diagnostic guidelines, risk factor reduction with self-care instructions and medication management.    Nutrition I class: Heart Healthy Eating:  -Group instruction provided by PowerPoint slides, verbal discussion, and written materials to support subject matter. The instructor gives an explanation and review of the Therapeutic Lifestyle Changes diet recommendations, which includes a discussion on lipid goals, dietary fat, sodium, fiber,  plant stanol/sterol esters, sugar, and the components of a well-balanced, healthy diet.   Nutrition II class: Lifestyle Skills:  -Group instruction provided by PowerPoint slides, verbal discussion, and written materials to support subject matter. The instructor gives an explanation and review of label reading, grocery shopping for heart health, heart healthy recipe modifications, and ways to make healthier choices when eating out.   Diabetes Question & Answer:  -Group instruction provided by PowerPoint slides, verbal discussion, and written materials to support subject matter. The instructor gives an explanation and review of diabetes co-morbidities, pre- and post-prandial blood glucose goals, pre-exercise blood glucose goals, signs, symptoms, and treatment of hypoglycemia and hyperglycemia, and foot care basics.   Diabetes Blitz:  -Group instruction provided by PowerPoint slides, verbal discussion, and written materials to support subject matter. The instructor gives an explanation and review of the physiology behind type 1 and type 2 diabetes, diabetes medications and rational behind using different medications, pre- and  post-prandial blood glucose recommendations and Hemoglobin A1c goals, diabetes diet, and exercise including blood glucose guidelines for exercising safely.    Portion Distortion:  -Group instruction provided by PowerPoint slides, verbal discussion, written materials, and food models to support subject matter. The instructor gives an explanation of serving size versus portion size, changes in portions sizes over the last 20 years, and what consists of a serving from each food group.   Stress Management:  -Group instruction provided by verbal instruction, video, and written materials to support subject matter.  Instructors review role of stress in heart disease and how to cope with stress positively.     Exercising on Your Own:  -Group instruction provided by verbal  instruction, power point, and written materials to support subject.  Instructors discuss benefits of exercise, components of exercise, frequency and intensity of exercise, and end points for exercise.  Also discuss use of nitroglycerin and activating EMS.  Review options of places to exercise outside of rehab.  Review guidelines for sex with heart disease. Flowsheet Row CARDIAC REHAB PHASE II EXERCISE from 07/06/2018 in [facility] CARDIAC REHAB  Date  06/22/18  Educator  EP  Instruction Review Code  2- Demonstrated Understanding      Cardiac Drugs I:  -Group instruction provided by verbal instruction and written materials to support subject.  Instructor reviews cardiac drug classes: antiplatelets, anticoagulants, beta blockers, and statins.  Instructor discusses reasons, side effects, and lifestyle considerations for each drug class. Flowsheet Row CARDIAC REHAB PHASE II EXERCISE from 07/06/2018 in [facility] CARDIAC REHAB  Date  07/06/18  Instruction Review Code  2- Demonstrated Understanding      Cardiac Drugs II:  -Group instruction provided by verbal instruction and written materials to support subject.  Instructor reviews cardiac drug classes: angiotensin converting enzyme inhibitors (ACE-I), angiotensin II receptor blockers (ARBs), nitrates, and calcium channel blockers.  Instructor discusses reasons, side effects, and lifestyle considerations for each drug class.   Anatomy and Physiology of the Circulatory System:  Group verbal and written instruction and models provide basic cardiac anatomy and physiology, with the coronary electrical and arterial systems. Review of: AMI, Angina, Valve disease, Heart Failure, Peripheral Artery Disease, Cardiac Arrhythmia, Pacemakers, and the ICD.   Other Education:  -Group or individual verbal, written, or video instructions that support the educational goals of the cardiac rehab program.   Holiday Eating Survival  Tips:  -Group instruction provided by PowerPoint slides, verbal discussion, and written materials to support subject matter. The instructor gives patients tips, tricks, and techniques to help them not only survive but enjoy the holidays despite the onslaught of food that accompanies the holidays.   Knowledge Questionnaire Score: Knowledge Questionnaire Score - 06/09/18 1145    Knowledge Questionnaire Score          Pre Score  21/24           Core Components/Risk Factors/Patient Goals at Admission: Personal Goals and Risk Factors at Admission - 06/09/18 1139    Core Components/Risk Factors/Patient Goals on Admission           Weight Management  Yes;Weight Maintenance    Intervention  Weight Management: Develop a combined nutrition and exercise program designed to reach desired caloric intake, while maintaining appropriate intake of nutrient and fiber, sodium and fats, and appropriate energy expenditure required for the weight goal.;Weight Management: Provide education and appropriate resources to help participant work on and attain dietary goals.;Weight Management/Obesity: Establish reasonable short term and long  term weight goals.    Admit Weight  135 lb 9.3 oz (61.5 kg)    Goal Weight: Long Term  135 lb (61.2 kg)    Expected Outcomes  Short Term: Continue to assess and modify interventions until short term weight is achieved;Weight Maintenance: Understanding of the daily nutrition guidelines, which includes 25-35% calories from fat, 7% or less cal from saturated fats, less than 200mg  cholesterol, less than 1.5gm of sodium, & 5 or more servings of fruits and vegetables daily;Understanding recommendations for meals to include 15-35% energy as protein, 25-35% energy from fat, 35-60% energy from carbohydrates, less than 200mg  of dietary cholesterol, 20-35 gm of total fiber daily;Understanding of distribution of calorie intake throughout the day with the consumption of 4-5 meals/snacks     Hypertension  Yes    Intervention  Provide education on lifestyle modifcations including regular physical activity/exercise, weight management, moderate sodium restriction and increased consumption of fresh fruit, vegetables, and low fat dairy, alcohol moderation, and smoking cessation.;Monitor prescription use compliance.    Expected Outcomes  Short Term: Continued assessment and intervention until BP is < 140/47mm HG in hypertensive participants. < 130/32mm HG in hypertensive participants with diabetes, heart failure or chronic kidney disease.;Long Term: Maintenance of blood pressure at goal levels.    Lipids  Yes    Intervention  Provide education and support for participant on nutrition & aerobic/resistive exercise along with prescribed medications to achieve LDL 70mg , HDL >40mg .    Expected Outcomes  Long Term: Cholesterol controlled with medications as prescribed, with individualized exercise RX and with personalized nutrition plan. Value goals: LDL < 70mg , HDL > 40 mg.;Short Term: Participant states understanding of desired cholesterol values and is compliant with medications prescribed. Participant is following exercise prescription and nutrition guidelines.    Stress  Yes    Intervention  Offer individual and/or small group education and counseling on adjustment to heart disease, stress management and health-related lifestyle change. Teach and support self-help strategies.;Refer participants experiencing significant psychosocial distress to appropriate mental health specialists for further evaluation and treatment. When possible, include family members and significant others in education/counseling sessions.    Expected Outcomes  Long Term: Emotional wellbeing is indicated by absence of clinically significant psychosocial distress or social isolation.;Short Term: Participant demonstrates changes in health-related behavior, relaxation and other stress management skills, ability to obtain effective  social support, and compliance with psychotropic medications if prescribed.           Core Components/Risk Factors/Patient Goals Review:  Goals and Risk Factor Review    Core Components/Risk Factors/Patient Goals Review    Row Name 06/27/18 1523   Personal Goals Review  Weight Management/Obesity;Hypertension;Lipids;Stress   Review  pt with multiple CAD RF eager to participate in CR program. pt has increased walking for exercise.  pt has also incorporated situps into daily routine.    Expected Outcomes  pt will participate in CR exercise, nutrition and lifestyle modification opportunities to decrease overall RF.           Core Components/Risk Factors/Patient Goals at Discharge (Final Review):  Goals and Risk Factor Review - 06/27/18 1523    Core Components/Risk Factors/Patient Goals Review          Personal Goals Review  Weight Management/Obesity;Hypertension;Lipids;Stress    Review  pt with multiple CAD RF eager to participate in CR program. pt has increased walking for exercise.  pt has also incorporated situps into daily routine.     Expected Outcomes  pt will participate in  CR exercise, nutrition and lifestyle modification opportunities to decrease overall RF.            ITP Comments: ITP Comments    Row Name 06/09/18 0850 06/27/18 1522   ITP Comments  Dr. Armanda Magic, Medical Director   30 day ITP review. pt with good attendance and participation.       Comments:

## 2018-07-08 ENCOUNTER — Encounter (HOSPITAL_COMMUNITY)
Admission: RE | Admit: 2018-07-08 | Discharge: 2018-07-08 | Disposition: A | Discharge status: Home / Self Care | Payer: Medicare Other | Source: Ambulatory Visit | Attending: Cardiovascular Disease | Admitting: Cardiovascular Disease

## 2018-07-08 ENCOUNTER — Encounter (HOSPITAL_COMMUNITY): Payer: Medicare Other

## 2018-07-08 DIAGNOSIS — I213 ST elevation (STEMI) myocardial infarction of unspecified site: Secondary | ICD-10-CM

## 2018-07-08 DIAGNOSIS — Z955 Presence of coronary angioplasty implant and graft: Secondary | ICD-10-CM | POA: Diagnosis not present

## 2018-07-08 DIAGNOSIS — Z48812 Encounter for surgical aftercare following surgery on the circulatory system: Secondary | ICD-10-CM | POA: Diagnosis not present

## 2018-07-08 DIAGNOSIS — I2111 ST elevation (STEMI) myocardial infarction involving right coronary artery: Secondary | ICD-10-CM | POA: Diagnosis not present

## 2018-07-11 ENCOUNTER — Encounter (HOSPITAL_COMMUNITY): Payer: Medicare Other

## 2018-07-11 ENCOUNTER — Ambulatory Visit: Payer: Medicare Other | Admitting: Cardiovascular Disease

## 2018-07-11 ENCOUNTER — Encounter (HOSPITAL_COMMUNITY)
Admission: RE | Admit: 2018-07-11 | Discharge: 2018-07-11 | Disposition: A | Discharge status: Home / Self Care | Payer: Medicare Other | Source: Ambulatory Visit | Attending: Cardiovascular Disease | Admitting: Cardiovascular Disease

## 2018-07-11 DIAGNOSIS — Z48812 Encounter for surgical aftercare following surgery on the circulatory system: Secondary | ICD-10-CM | POA: Diagnosis not present

## 2018-07-11 DIAGNOSIS — Z955 Presence of coronary angioplasty implant and graft: Secondary | ICD-10-CM | POA: Diagnosis not present

## 2018-07-11 DIAGNOSIS — I213 ST elevation (STEMI) myocardial infarction of unspecified site: Secondary | ICD-10-CM

## 2018-07-11 DIAGNOSIS — I2111 ST elevation (STEMI) myocardial infarction involving right coronary artery: Secondary | ICD-10-CM | POA: Diagnosis not present

## 2018-07-11 NOTE — Telephone Encounter (Signed)
-----  Message from Thayer Headings, MD sent at 06/20/2018  2:16 PM EDT ----- Regarding: RE: request to do high intensity and strength Rodney Walker may increase target HR . Agree with suggestions as you have outlined  ----- Message ----- From: Dorna Bloom D Sent: 06/20/2018  10:46 AM To: Thayer Headings, MD Subject: request to do high intensity and strength      Your patient Rodney Walker is interested in doing high intensity interval training (HIIT) in Cardiac Rehab.  They have been in program for 2 weeks and have been doing great.  We would like to change their exercise prescription to include HIIT.  Their current THR is 13-146 (54-80%) and we would like to increase it to 174 max (95 %) for HIIT.  Their RPE levels for the HIIT would reach up to 16-17 and active rest would be 11-13.  They would start at 2 min of active rest and 30 sec of high intensity and progress as tolerated. If you are agreeable to this change in exercise prescription please let us know.    Patient has been in cardiac rehab approximately 2 weeks and doing is well. Pt has been averaging 5.7  METS and BP/HR has been in stabled with exercise. Pt is interested in doing strength training. If you feel this patient is appropriate to begin strength training please f/u with cardiac rehab staff.  Pt has been in cardiac rehab approximately 2 weeks and is doing well. Pt would like to begin jogging. His current MET level is 5.7 pt current THR is 73-146 and we are requesting an increase to 95% at 174 bpm. If you feel this patient is appropriate to begin jogging and increase target heart rate please follow up with cardiac rehab team. We appreciate your assistance and referral of your patient to our program.     Thanks so much for your help!   Chief Operating Officer

## 2018-07-13 ENCOUNTER — Encounter (HOSPITAL_COMMUNITY): Payer: Medicare Other

## 2018-07-13 ENCOUNTER — Encounter (HOSPITAL_COMMUNITY)
Admission: RE | Admit: 2018-07-13 | Discharge: 2018-07-13 | Disposition: A | Discharge status: Home / Self Care | Payer: Medicare Other | Source: Ambulatory Visit | Attending: Cardiovascular Disease | Admitting: Cardiovascular Disease

## 2018-07-13 DIAGNOSIS — Z955 Presence of coronary angioplasty implant and graft: Secondary | ICD-10-CM

## 2018-07-13 DIAGNOSIS — I213 ST elevation (STEMI) myocardial infarction of unspecified site: Secondary | ICD-10-CM

## 2018-07-13 DIAGNOSIS — I2111 ST elevation (STEMI) myocardial infarction involving right coronary artery: Secondary | ICD-10-CM | POA: Diagnosis not present

## 2018-07-13 DIAGNOSIS — Z48812 Encounter for surgical aftercare following surgery on the circulatory system: Secondary | ICD-10-CM | POA: Diagnosis not present

## 2018-07-14 ENCOUNTER — Encounter: Payer: Self-pay | Admitting: Registered"

## 2018-07-14 ENCOUNTER — Encounter: Payer: Medicare Other | Attending: Physician Assistant | Admitting: Registered"

## 2018-07-14 DIAGNOSIS — I2111 ST elevation (STEMI) myocardial infarction involving right coronary artery: Secondary | ICD-10-CM | POA: Insufficient documentation

## 2018-07-14 DIAGNOSIS — J45909 Unspecified asthma, uncomplicated: Secondary | ICD-10-CM | POA: Insufficient documentation

## 2018-07-14 DIAGNOSIS — Z713 Dietary counseling and surveillance: Secondary | ICD-10-CM | POA: Insufficient documentation

## 2018-07-14 DIAGNOSIS — B20 Human immunodeficiency virus [HIV] disease: Secondary | ICD-10-CM | POA: Insufficient documentation

## 2018-07-14 DIAGNOSIS — I2510 Atherosclerotic heart disease of native coronary artery without angina pectoris: Secondary | ICD-10-CM | POA: Insufficient documentation

## 2018-07-14 NOTE — Progress Notes (Signed)
Medical Nutrition Therapy:  Appt start time: 0915 end time:  1000.  Assessment:  Primary concerns today: Pt referred due to heart attack. Pt has been dx with CAD, HIV, and asthma. Nutrition Follow-Up: Pt present for appointment. Pt reports things are going well. Pt reports that cardiac rehab program has been going good. He reports he has been attending nutrition classes through the rehab program as well. Has rehab Mon, Wed, Fri. He reports that he has been enjoying rehab and feels it has been very beneficial. Reports that others in the program encourage him. Pt reports that some of his friends have started checking nutrition labels when shopping after seeing him reviewing them.   Pt reports that getting in 3 meals has been going better, but could be improved. He reports that now he always gets at least 2 meals per day, 3 meals at least 3 days out of the week. Reports that his meal pattern has improved since he started coming as he used to often only have 1 meal a day and now he never eats less than 2 meals per day. 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 Reports that on Sundays they often leave to travel to their singing location soon after church and it is difficult for him to eat lunch those days. Pt does not like to eat too soon to when he is going to be singing. Reports he is ok with eating if he has about 2-3 hours before singing time.  Reports he often will go home to change and then come back to church to travel to location.Traveling day schedule last Sunday:out of church around 1:30 pm and left a little after 2 pm for [place]. Sang with his group at 3 pm for about 15-20 minutes. Left afterwards and group stopped at [employer].   Weight Hx: 07/14/18: 133 lb  05/30/18: 134 lb  04/27/18: 132 lb 04/05/18: 136 lb 09/29/17: 138 lb   04/27/17: 134 lb  04/21/16: 132.5 lb   Preferred Learning Style:  No preference indicated   Learning Readiness:   Ready  MEDICATIONS: See list.     DIETARY INTAKE:  Usual eating pattern includes 3-2 meals and sometimes a snack each day. Pt reports getting 3 meals on days he has rehab because he knows he is doing more activity those days and thus needs more energy.   Everyday foods include rice.  Avoided foods include hamburgers (pt reports that after eating hamburgers they make him feel bloated and he has concerns about number of calories).    24-hr recall:  B (AM): 16 oz water, 1 bagel with strawberry jelly, eggs  Snk ( AM):  None reported.  L (PM): grilled chicken salad, 1 pack of ranch dressing, strawberry fruit drink (reports that he limited his dressing to one pack due to concerns about high sodium content)  Snk ( PM): None reported.  D ( PM): 2 baked small chicken breasts, 2 pieces of Honey Wheat, 1 glass of whole milk (meal prepared at home) Snk  PM): peach Beverages: 1.5 bottles of water per day, fruit drink, whole milk   Usual physical activity: Cardiac rehab-40 minute workouts 3 days per week.    Estimated energy needs: ~2625 calories 295-427 g carbohydrates 48 g protein 58-102 g fat  Progress Towards Goal(s):  Some progress.-Pt is now getting in 3 meals at least 3 days per week and drinking at least 24 oz of water    Nutritional Diagnosis:  NB-1.1 Food and nutrition-related knowledge deficit As related to heart healthy nutrition therapy .  As evidenced by pt unsure how to eat to promote heart health . NI-5.11.1 Predicted suboptimal nutrient intake As related to skipping meals .  As evidenced by pt reports he typically only eats 2 meals per day .    Intervention:  Nutrition counseling provided. Dietitian praised pt for attending cardiac rehab and continuing to work on having 3 meals per day. Discussed strategies for getting in 3 meals more consistently including  on days pt is traveling with his singing group. Discussed having snacks with protein and energy on hand for when pt is on the go as well. Encouraged pt to continue being mindful of sodium and saturated fat intake by viewing nutrition information and to continue working to include more water.  Pt appeared agreeable to information/goals discussed.   Instructions/Goals:  Great job with attending cardiac rehab!  -Continue to include 3 meals per day  Recommend having easy go to foods for days you know you will not have much time to prepare/plan a meal such as on days you are travel having a snack in between meals-especially when you start increasing your activity.    -Continue being mindful of sodium content and saturated fat content to promote heart health (try to limit items with 20% or more daily value for sodium or saturated fat).   [email or web address]   -Great job with getting in more water! Continue working toward 27 oz/4 water bottles per day  Teaching Method Utilized:  Visual Auditory  Barriers to learning/adherence to lifestyle change: None indicated.   Demonstrated degree of understanding via:  Teach Back   Monitoring/Evaluation:  Dietary intake, exercise, and body weight in 1 month(s).

## 2018-07-14 NOTE — Patient Instructions (Addendum)
Instructions/Goals:  Great job with attending cardiac rehab!  -Continue to include 3 meals per day  Recommend having easy go to foods for days you know you will not have much time to prepare/plan a meal such as on days you are traveling. Also recommend having a snack in between meals, especially if unable to eat much at meal times.   Try for at least 8 g protein for a bar and around 15 g carbohydrates, and at least 3 g fiber.     -Continue being mindful of sodium content and saturated fat content to promote heart health (try to limit items with 20% or more daily value for sodium or saturated fat).   [email or web address]   -Great job with getting in more water! Continue working toward at least 64 oz/4 water bottles per day

## 2018-07-15 ENCOUNTER — Encounter (HOSPITAL_COMMUNITY)
Admission: RE | Admit: 2018-07-15 | Discharge: 2018-07-15 | Disposition: A | Discharge status: Home / Self Care | Payer: Medicare Other | Source: Ambulatory Visit | Attending: Cardiovascular Disease | Admitting: Cardiovascular Disease

## 2018-07-15 ENCOUNTER — Encounter (HOSPITAL_COMMUNITY): Payer: Medicare Other

## 2018-07-15 DIAGNOSIS — Z955 Presence of coronary angioplasty implant and graft: Secondary | ICD-10-CM

## 2018-07-15 DIAGNOSIS — I2111 ST elevation (STEMI) myocardial infarction involving right coronary artery: Secondary | ICD-10-CM | POA: Diagnosis not present

## 2018-07-15 DIAGNOSIS — Z48812 Encounter for surgical aftercare following surgery on the circulatory system: Secondary | ICD-10-CM | POA: Diagnosis not present

## 2018-07-15 DIAGNOSIS — I213 ST elevation (STEMI) myocardial infarction of unspecified site: Secondary | ICD-10-CM

## 2018-07-18 ENCOUNTER — Encounter (HOSPITAL_COMMUNITY): Payer: Medicare Other

## 2018-07-18 ENCOUNTER — Encounter (HOSPITAL_COMMUNITY)
Admission: RE | Admit: 2018-07-18 | Discharge: 2018-07-18 | Disposition: A | Discharge status: Home / Self Care | Payer: Medicare Other | Source: Ambulatory Visit | Attending: Cardiovascular Disease | Admitting: Cardiovascular Disease

## 2018-07-18 DIAGNOSIS — I213 ST elevation (STEMI) myocardial infarction of unspecified site: Secondary | ICD-10-CM

## 2018-07-18 DIAGNOSIS — Z955 Presence of coronary angioplasty implant and graft: Secondary | ICD-10-CM | POA: Diagnosis not present

## 2018-07-18 DIAGNOSIS — Z48812 Encounter for surgical aftercare following surgery on the circulatory system: Secondary | ICD-10-CM | POA: Diagnosis not present

## 2018-07-18 DIAGNOSIS — I2111 ST elevation (STEMI) myocardial infarction involving right coronary artery: Secondary | ICD-10-CM | POA: Diagnosis not present

## 2018-07-20 ENCOUNTER — Encounter (HOSPITAL_COMMUNITY): Payer: Medicare Other

## 2018-07-20 ENCOUNTER — Encounter (HOSPITAL_COMMUNITY)
Admission: RE | Admit: 2018-07-20 | Discharge: 2018-07-20 | Disposition: A | Discharge status: Home / Self Care | Payer: Medicare Other | Source: Ambulatory Visit | Attending: Cardiovascular Disease | Admitting: Cardiovascular Disease

## 2018-07-20 DIAGNOSIS — I213 ST elevation (STEMI) myocardial infarction of unspecified site: Secondary | ICD-10-CM

## 2018-07-20 DIAGNOSIS — Z48812 Encounter for surgical aftercare following surgery on the circulatory system: Secondary | ICD-10-CM | POA: Diagnosis not present

## 2018-07-20 DIAGNOSIS — I2111 ST elevation (STEMI) myocardial infarction involving right coronary artery: Secondary | ICD-10-CM | POA: Diagnosis not present

## 2018-07-20 DIAGNOSIS — Z955 Presence of coronary angioplasty implant and graft: Secondary | ICD-10-CM

## 2018-07-22 ENCOUNTER — Encounter (HOSPITAL_COMMUNITY): Payer: Medicare Other

## 2018-07-25 ENCOUNTER — Encounter (HOSPITAL_COMMUNITY)
Admission: RE | Admit: 2018-07-25 | Discharge: 2018-07-25 | Disposition: A | Discharge status: Home / Self Care | Payer: Medicare Other | Source: Ambulatory Visit | Attending: Cardiovascular Disease | Admitting: Cardiovascular Disease

## 2018-07-25 ENCOUNTER — Encounter (HOSPITAL_COMMUNITY): Payer: Medicare Other

## 2018-07-25 DIAGNOSIS — I2111 ST elevation (STEMI) myocardial infarction involving right coronary artery: Secondary | ICD-10-CM | POA: Diagnosis not present

## 2018-07-25 DIAGNOSIS — Z955 Presence of coronary angioplasty implant and graft: Secondary | ICD-10-CM

## 2018-07-25 DIAGNOSIS — Z48812 Encounter for surgical aftercare following surgery on the circulatory system: Secondary | ICD-10-CM | POA: Diagnosis not present

## 2018-07-25 DIAGNOSIS — I213 ST elevation (STEMI) myocardial infarction of unspecified site: Secondary | ICD-10-CM

## 2018-07-27 ENCOUNTER — Encounter (HOSPITAL_COMMUNITY)
Admission: RE | Admit: 2018-07-27 | Discharge: 2018-07-27 | Disposition: A | Discharge status: Home / Self Care | Payer: Medicare Other | Source: Ambulatory Visit | Attending: Cardiovascular Disease | Admitting: Cardiovascular Disease

## 2018-07-27 ENCOUNTER — Encounter (HOSPITAL_COMMUNITY): Payer: Medicare Other

## 2018-07-27 DIAGNOSIS — Z48812 Encounter for surgical aftercare following surgery on the circulatory system: Secondary | ICD-10-CM | POA: Diagnosis not present

## 2018-07-27 DIAGNOSIS — Z955 Presence of coronary angioplasty implant and graft: Secondary | ICD-10-CM | POA: Diagnosis not present

## 2018-07-27 DIAGNOSIS — I2111 ST elevation (STEMI) myocardial infarction involving right coronary artery: Secondary | ICD-10-CM | POA: Diagnosis not present

## 2018-07-27 DIAGNOSIS — I213 ST elevation (STEMI) myocardial infarction of unspecified site: Secondary | ICD-10-CM

## 2018-07-29 ENCOUNTER — Encounter (HOSPITAL_COMMUNITY): Payer: Medicare Other

## 2018-08-01 ENCOUNTER — Encounter (HOSPITAL_COMMUNITY): Payer: Medicare Other

## 2018-08-03 ENCOUNTER — Encounter (HOSPITAL_COMMUNITY): Payer: Medicare Other

## 2018-08-03 ENCOUNTER — Encounter (HOSPITAL_COMMUNITY): Payer: Self-pay | Admitting: Cardiac Rehabilitation

## 2018-08-03 ENCOUNTER — Encounter (HOSPITAL_COMMUNITY): Payer: Self-pay

## 2018-08-03 DIAGNOSIS — I213 ST elevation (STEMI) myocardial infarction of unspecified site: Secondary | ICD-10-CM

## 2018-08-03 DIAGNOSIS — Z955 Presence of coronary angioplasty implant and graft: Secondary | ICD-10-CM

## 2018-08-03 NOTE — Progress Notes (Signed)
Cardiac Individual Treatment Plan  Patient Details  Name: Rodney Walker MRN: [id number] Date of Birth: 12/01/1981 Referring Provider:   Flowsheet Row CARDIAC REHAB PHASE II ORIENTATION from 06/09/2018 in [facility] CARDIAC [facility]  Referring Provider  [name], [name] MD       Initial Encounter Date:  Flowsheet Row CARDIAC REHAB PHASE II ORIENTATION from 06/09/2018 in [facility] CARDIAC REHAB  Date  06/09/18      Visit Diagnosis: ST elevation myocardial infarction (STEMI), unspecified artery (HCC)  Stented coronary artery  Patient's Home Medications on Admission:  Current Outpatient Medications:  .  albuterol (PROVENTIL HFA;VENTOLIN HFA) 108 (90 Base) MCG/ACT inhaler, Inhale 4 puffs into the lungs every 4 (four) hours as needed for wheezing or shortness of breath. (Patient not taking: Reported on 05/31/2018), Disp: 1 each, Rfl: 2 .  aspirin 81 MG EC tablet, Take 1 tablet (81 mg total) by mouth daily., Disp: 90 tablet, Rfl: 3 .  atorvastatin (LIPITOR) 40 MG tablet, Take 1 tablet (40 mg total) by mouth daily at 6 PM., Disp: 90 tablet, Rfl: 3 .  BIKTARVY 50-200-25 MG TABS tablet, TAKE 1 TABLET BY MOUTH DAILY, Disp: 30 tablet, Rfl: 5 .  cetirizine (ZYRTEC) 10 MG tablet, TAKE 1 TABLET(10 MG) BY MOUTH DAILY, Disp: 30 tablet, Rfl: 5 .  darunavir-cobicistat (PREZCOBIX) 800-150 MG tablet, Take 1 tablet daily with breakfast by mouth. Swallow whole. Do NOT crush, break or chew tablets. Take with food., Disp: 30 tablet, Rfl: 5 .  diclofenac sodium (VOLTAREN) 1 % GEL, Apply 2 g topically 4 (four) times daily., Disp: 100 g, Rfl: 0 .  lisinopril (PRINIVIL,ZESTRIL) 2.5 MG tablet, Take 1 tablet (2.5 mg total) by mouth daily., Disp: 90 tablet, Rfl: 3 .  methocarbamol (ROBAXIN) 500 MG tablet, Take 1 tablet (500 mg total) by mouth at bedtime as needed for muscle spasms. (Patient not taking: Reported on 05/31/2018), Disp: 20 tablet, Rfl: 0 .  metoprolol tartrate  (LOPRESSOR) 25 MG tablet, Take 0.5 tablets (12.5 mg total) by mouth 2 (two) times daily., Disp: 60 tablet, Rfl: 6 .  nitroGLYCERIN (NITROSTAT) 0.4 MG SL tablet, Place 1 tablet (0.4 mg total) under the tongue every 5 (five) minutes x 3 doses as needed for chest pain. (Patient not taking: Reported on 06/13/2018), Disp: 25 tablet, Rfl: 2  Past Medical History: Past Medical History:  Diagnosis Date  . COPD (chronic obstructive pulmonary disease) (HCC)   . Coronary artery disease    MI 2010 - dissection/plaque rupture in LAD tx with antiplt/antithrombotics >> relook cath with resolution (no PCI performed) // Inf STEMI 3/19: OM1 30; pRCA 100 >> DES // Echo 3/19:  mild LVH, EF 40-45, inf/inf-sept HK, trivial MR  . Eczema   . History of myocardial infarction 2010; 2019  . History of pleural empyema   . HIV infection (HCC)   . Hyperlipidemia   . Immune deficiency disorder (HCC)   . Ischemic cardiomyopathy 04/05/2018  . Moderate persistent asthma without complication 10/29/2016    Tobacco Use: Social History   Tobacco Use  Smoking Status Former Smoker  . Packs/day: 0.25  . Years: 17.00  . Pack years: 4.25  . Types: Cigarettes  . Last attempt to quit: 03/23/2015  . Years since quitting: 3.3  Smokeless Tobacco Never Used    Labs: Recent Review Advice worker    Labs for ITP Cardiac and Pulmonary Rehab Latest Ref Rng & Units 03/03/2016 04/13/2016 02/12/2017 03/24/2018 03/25/2018   Cholestrol 0 -  200 mg/dL - 454 098 119 147   LDLCALC 0 - 99 mg/dL - 829 562(Z) 308(M) 95   HDL >40 mg/dL - 41 44 45 57(Q)   Trlycerides <150 mg/dL - 80 77 469 629   Hemoglobin A1c 4.8 - 5.6 % 5.2 - - 5.0 -   PHART 7.350 - 7.450 - - - - -   PCO2ART 35.0 - 45.0 mmHg - - - - -   HCO3 20.0 - 24.0 mEq/L - - - - -   TCO2 0 - 100 mmol/L - - - - -   O2SAT % - - - - -      Capillary Blood Glucose: Lab Results  Component Value Date   GLUCAP 106 (H) 03/25/2018   GLUCAP 93 03/25/2018   GLUCAP 109 (H) 03/25/2018   GLUCAP  99 06/19/2009     Exercise Target Goals:    Exercise Program Goal: Individual exercise prescription set using results from initial 6 min walk test and THRR while considering  patient's activity barriers and safety.   Exercise Prescription Goal: Initial exercise prescription builds to 30-45 minutes a day of aerobic activity, 2-3 days per week.  Home exercise guidelines will be given to patient during program as part of exercise prescription that the participant will acknowledge.  Activity Barriers & Risk Stratification: Activity Barriers & Cardiac Risk Stratification - 06/09/18 0909    Activity Barriers & Cardiac Risk Stratification          Activity Barriers  Other (comment)    Comments  Chronic Right Hip Pain    Cardiac Risk Stratification  High           6 Minute Walk: 6 Minute Walk    6 Minute Walk    Row Name 06/09/18 1143   Phase  Initial   Distance  1743 feet   Walk Time  3 minutes   # of Rest Breaks  0   MPH  3.3   METS  5.67   RPE  7   Perceived Dyspnea   0   VO2 Peak  19.84   Symptoms  No   Resting HR  70 bpm   Resting BP  104/60   Resting Oxygen Saturation   97 %   Exercise Oxygen Saturation  during 6 min walk  98 %   Max Ex. HR  103 bpm   Max Ex. BP  118/72   2 Minute Post BP  102/58          Oxygen Initial Assessment:   Oxygen Re-Evaluation:   Oxygen Discharge (Final Oxygen Re-Evaluation):   Initial Exercise Prescription: Initial Exercise Prescription - 06/09/18 1100    Date of Initial Exercise RX and Referring Provider          Date  06/09/18    Referring Provider  [name], [name] MD         Treadmill          MPH  2.5    Grade  1    Minutes  10    METs  3.26        Bike          Level  1    Minutes  10    METs  4.14        NuStep          Level  3    SPM  75    Minutes  10    METs  3.5  Prescription Details          Frequency (times per week)  3x    Duration  Progress to 30 minutes of continuous aerobic  without signs/symptoms of physical distress        Intensity          THRR 40-80% of Max Heartrate  73-146    Ratings of Perceived Exertion  11-13    Perceived Dyspnea  0-4        Progression          Progression  Continue progressive overload as per policy without signs/symptoms or physical distress.        Resistance Training          Training Prescription  Yes    Weight  5lb    Reps  10-15           Perform Capillary Blood Glucose checks as needed.  Exercise Prescription Changes: Exercise Prescription Changes    Response to Exercise    Row Name 06/13/18 1600 06/27/18 1331 07/11/18 1542 07/27/18 1225 07/29/18 1200   Blood Pressure (Admit)  108/60  114/62  112/58  104/70  no documentation   Blood Pressure (Exercise)  160/86  160/70  128/82  136/72  no documentation   Blood Pressure (Exit)  102/78  124/70  110/60  100/60  no documentation   Heart Rate (Admit)  80 bpm  76 bpm  71 bpm  68 bpm  no documentation   Heart Rate (Exercise)  143 bpm  138 bpm  138 bpm  150 bpm  no documentation   Heart Rate (Exit)  77 bpm  76 bpm  73 bpm  76 bpm  no documentation   Rating of Perceived Exertion (Exercise)  11  13  14  13   no documentation   Perceived Dyspnea (Exercise)  0  0  0  no documentation  no documentation   Symptoms  None   None   None   None   no documentation   Comments  Pt oriented to exercise equipment   no documentation  Pt does weight machine for 10 minutes 40-60lbs   Pt does weight machine for 10 minutes 40-60lbs   no documentation   Duration  Progress to 30 minutes of  aerobic without signs/symptoms of physical distress  Progress to 30 minutes of  aerobic without signs/symptoms of physical distress  Progress to 30 minutes of  aerobic without signs/symptoms of physical distress  Progress to 30 minutes of  aerobic without signs/symptoms of physical distress  no documentation   Intensity  THRR unchanged  THRR unchanged  THRR unchanged  THRR unchanged  no documentation        Progression    Row Name 06/13/18 1600 06/27/18 1331 07/11/18 1542 07/27/18 1225 07/29/18 1200   Progression  Continue to progress workloads to maintain intensity without signs/symptoms of physical distress.  Continue to progress workloads to maintain intensity without signs/symptoms of physical distress.  Continue to progress workloads to maintain intensity without signs/symptoms of physical distress.  Continue to progress workloads to maintain intensity without signs/symptoms of physical distress.  no documentation   Average METs  3.9  7.2  7.5  7.6  no documentation       Resistance Training    Row Name 06/13/18 1600 06/27/18 1331 07/11/18 1542 07/27/18 1225 07/29/18 1200   Training Prescription  Yes  Yes  Yes  No relaxation day  no documentation   Weight  5lbs  6lbs  6lbs  no documentation  no documentation   Reps  10-15  10-15  10-15  no documentation  no documentation   Time  10 Minutes  10 Minutes  10 Minutes  10 Minutes  no documentation       Interval Training    Row Name 06/13/18 1600 06/27/18 1331 07/11/18 1542 07/27/18 1225 07/29/18 1200   Interval Training  No  No  Yes  Yes  no documentation   Equipment  no documentation  no documentation  Treadmill  Treadmill  no documentation   Comments  no documentation  no documentation  6'walking and 4' jogging  6'walking and 4' jogging  no documentation       Treadmill    Row Name 06/13/18 1600 06/27/18 1331 07/11/18 1542 07/27/18 1225 07/29/18 1200   MPH  2.7  3.5 jogging pace 5.2/0 for 4 min/ walking for 6 min total  3.5 jogging pace 5.2/3 for 4 min/ walking 3.5/3 for 6 min total  3.5 jogging pace 5.2/3 for 4 min/ walking 3.5/3 for 6 min total  no documentation   Grade  1  4  3  3   no documentation   Minutes  10  10  10  10   no documentation   METs  4.07  6.9  7  7   no documentation       Bike    Row Name 06/13/18 1600 06/27/18 1331 07/11/18 1542 07/27/18 1225 07/29/18 1200   Level  1  no documentation  no documentation  no  documentation  no documentation   Minutes  10  no documentation  no documentation  no documentation  no documentation   METs  4.2  no documentation  no documentation  no documentation  no documentation       NuStep    Row Name 06/13/18 1600 06/27/18 1331 07/11/18 1542 07/27/18 1225 07/29/18 1200   Level  3  4  no documentation  9 HIIT@ level 9 for 2 min and mod inten. @ level 6 for 8 min  no documentation   SPM  85  120  no documentation  145  no documentation   Minutes  10  10  no documentation  10  no documentation   METs  4.2  6.6  no documentation  8.1  no documentation       Rower    Row Name 06/13/18 1600 06/27/18 1331 07/11/18 1542 07/27/18 1225 07/29/18 1200   Level  no documentation  4  5  no documentation  no documentation   Watts  no documentation  67  68  no documentation  no documentation   Minutes  no documentation  10  10  no documentation  no documentation   METs  no documentation  8  8  no documentation  no documentation       Home Exercise Plan    Row Name 06/13/18 1600 06/27/18 1331 07/11/18 1542 07/27/18 1225 07/29/18 1200   Plans to continue exercise at  no documentation  Home (comment)  Home (comment)  Home (comment)  no documentation   Frequency  no documentation  Add 2 additional days to program exercise sessions.  Add 2 additional days to program exercise sessions.  Add 2 additional days to program exercise sessions.  no documentation   Initial Home Exercises Provided  no documentation  06/20/18  06/20/18  06/20/18  no documentation          Exercise Comments: Exercise Comments  Row Name 06/13/18 1634 06/28/18 1339 07/06/18 1159 07/22/18 1227   Exercise Comments  Pt off to a great start with exercise. Pt oriented to exercise equipment. Will continue to monitor and progress pt as tolerated.   Reviewed METs and goals. Pt is tolerating exercise prescription very well. Cardiac rehab staff will continue to monitor pt's intensity and activity levels.   Reviewed  METs and goals. Pt is tolerating exercise prescription very well. Cardiac rehab staff will continue to monitor pt's intensity and activity levels.   Reviewed METs and goals. Pt is tolerating exercise prescription very well. Cardiac rehab staff will continue to monitor pt's intensity and activity levels.       Exercise Goals and Review: Exercise Goals    Exercise Goals    Row Name 06/09/18 1144   Increase Physical Activity  Yes   Intervention  Develop an individualized exercise prescription for aerobic and resistive training based on initial evaluation findings, risk stratification, comorbidities and participant's personal goals.;Provide advice, education, support and counseling about physical activity/exercise needs.   Expected Outcomes  Long Term: Add in home exercise to make exercise part of routine and to increase amount of physical activity.;Short Term: Attend rehab on a regular basis to increase amount of physical activity.;Long Term: Exercising regularly at least 3-5 days a week.   Increase Strength and Stamina  Yes   Intervention  Provide advice, education, support and counseling about physical activity/exercise needs.;Develop an individualized exercise prescription for aerobic and resistive training based on initial evaluation findings, risk stratification, comorbidities and participant's personal goals.   Expected Outcomes  Long Term: Improve cardiorespiratory fitness, muscular endurance and strength as measured by increased METs and functional capacity ( );Short Term: Perform resistance training exercises routinely during rehab and add in resistance training at home;Short Term: Increase workloads from initial exercise prescription for resistance, speed, and METs.   Able to understand and use rate of perceived exertion (RPE) scale  Yes   Intervention  Provide education and explanation on how to use RPE scale   Expected Outcomes  Short Term: Able to use RPE daily in rehab to express  subjective intensity level;Long Term:  Able to use RPE to guide intensity level when exercising independently   Knowledge and understanding of Target Heart Rate Range (THRR)  Yes   Intervention  Provide education and explanation of THRR including how the numbers were predicted and where they are located for reference   Expected Outcomes  Short Term: Able to use daily as guideline for intensity in rehab;Long Term: Able to use THRR to govern intensity when exercising independently;Short Term: Able to state/look up THRR   Able to check pulse independently  Yes   Intervention  Provide education and demonstration on how to check pulse in carotid and radial arteries.;Review the importance of being able to check your own pulse for safety during independent exercise   Expected Outcomes  Short Term: Able to explain why pulse checking is important during independent exercise;Long Term: Able to check pulse independently and accurately   Understanding of Exercise Prescription  Yes   Intervention  Provide education, explanation, and written materials on patient's individual exercise prescription   Expected Outcomes  Long Term: Able to explain home exercise prescription to exercise independently;Short Term: Able to explain program exercise prescription          Exercise Goals Re-Evaluation : Exercise Goals Re-Evaluation    Exercise Goal Re-Evaluation    Row Name 06/20/18 1151 07/06/18 1159 07/20/18 1619   Exercise  Goals Review  Increase Physical Activity;Knowledge and understanding of Target Heart Rate Range (THRR);Able to understand and use rate of perceived exertion (RPE) scale;Understanding of Exercise Prescription;Increase Strength and Stamina;Able to check pulse independently  Increase Physical Activity;Knowledge and understanding of Target Heart Rate Range (THRR);Able to understand and use rate of perceived exertion (RPE) scale;Understanding of Exercise Prescription;Increase Strength and Stamina;Able to  check pulse independently  Increase Physical Activity;Knowledge and understanding of Target Heart Rate Range (THRR);Able to understand and use rate of perceived exertion (RPE) scale;Understanding of Exercise Prescription;Increase Strength and Stamina;Able to check pulse independently   Comments  Reviewed home exercise with pt today.  Pt plans to walk for exercise, in addition to coming to cardiac rehab.  Discussed with patient indoor options for hot and humid days. Reviewed activity limitations and temperature precautions. Pt voiced understanding.  Pt is now jogging and doing the rower machine in cardiac rehab. Pt has noticed an increase in strength and stamina since starting cardiac rehab. Pt handles WL increases very well and enjoys the variety of exercises being offered in cardiac rehab.  Pt is jogging intermittently at home and in cardiac rehab.Ppt also does weight training and ab exercises to increase muscle mass.   Expected Outcomes  Pt will continue to improve in cardiorespiratory fitness by coming to cardiac rehab and walking in neighborhood   Pt will continue to improve in cardiorespiratory fitness by coming to cardiac rehab and walking/ jogging in neighborhood   Pt will continue to improve in cardiorespiratory fitness and musculoskeletal endurance.           Discharge Exercise Prescription (Final Exercise Prescription Changes): Exercise Prescription Changes - 07/29/18 1200    Response to Exercise          Blood Pressure (Admit)  --    Blood Pressure (Exercise)  --    Blood Pressure (Exit)  --    Heart Rate (Admit)  --    Heart Rate (Exercise)  --    Heart Rate (Exit)  --    Rating of Perceived Exertion (Exercise)  --    Perceived Dyspnea (Exercise)  --    Symptoms  --    Comments  --    Duration  --    Intensity  --        Progression          Progression  --    Average METs  --        Resistance Training          Training Prescription  --    Weight  --    Reps  --     Time  --        Interval Training          Interval Training  --    Equipment  --    Comments  --        Treadmill          MPH  --    Grade  --    Minutes  --    METs  --        Rower          Level  --    Watts  --    Minutes  --    METs  --        Home Exercise Plan          Plans to continue exercise at  --    Frequency  --  Initial Home Exercises Provided  --           Nutrition:  Target Goals: Understanding of nutrition guidelines, daily intake of sodium 1500mg , cholesterol 200mg , calories 30% from fat and 7% or less from saturated fats, daily to have 5 or more servings of fruits and vegetables.  Biometrics: Pre Biometrics - 06/09/18 1144    Pre Biometrics          Height  5' 5.5" (1.664 m)    Weight  135 lb 9.3 oz (61.5 kg)    Waist Circumference  28 inches    Hip Circumference  30.5 inches    Waist to Hip Ratio  0.92 %    BMI (Calculated)  22.21    Triceps Skinfold  6 mm    % Body Fat  15.4 %    Grip Strength  48 kg    Flexibility  13 in    Single Leg Stand  30 seconds            Nutrition Therapy Plan and Nutrition Goals: Nutrition Therapy & Goals - 06/09/18 1245    Nutrition Therapy          Diet  heart healthy           Nutrition Assessments: Nutrition Assessments - 06/09/18 1245    MEDFICTS Scores          Pre Score  49           Nutrition Goals Re-Evaluation:   Nutrition Goals Re-Evaluation:   Nutrition Goals Discharge (Final Nutrition Goals Re-Evaluation):   Psychosocial: Target Goals: Acknowledge presence or absence of significant depression and/or stress, maximize coping skills, provide positive support system. Participant is able to verbalize types and ability to use techniques and skills needed for reducing stress and depression.  Initial Review & Psychosocial Screening: Initial Psych Review & Screening - 06/09/18 1052    Initial Review          Current issues with  Current Stress Concerns    Source  of Stress Concerns  Transportation    Comments  Raja inquired about bus vouchers.  Contact information given for Medicaid transportation help.        Family Dynamics          Good Support System?  Yes Yadir lists his family and friends as his support system.        Barriers          Psychosocial barriers to participate in program  There are no identifiable barriers or psychosocial needs.        Screening Interventions          Interventions  Encouraged to exercise           Quality of Life Scores: Quality of Life - 06/09/18 1146    Quality of Life Scores          Health/Function Pre  27.2 %    Socioeconomic Pre  21 %    Psych/Spiritual Pre  28.29 %    Family Pre  27.6 %    GLOBAL Pre  26.06 %          Scores of 19 and below usually indicate a poorer quality of life in these areas.  A difference of  2-3 points is a clinically meaningful difference.  A difference of 2-3 points in the total score of the Quality of Life Index has been associated with significant improvement in overall quality of life, self-image, physical symptoms, and  general health in studies assessing change in quality of life.  PHQ-9: Recent Review Flowsheet Data    Depression screen [facility] 2/9 06/13/2018 05/05/2018 04/27/2018 09/29/2017 06/29/2017   Decreased Interest 0 0 0 0 0   Down, Depressed, Hopeless 0 0 0 0 0   PHQ - 2 Score 0 0 0 0 0     Interpretation of Total Score  Total Score Depression Severity:  1-4 = Minimal depression, 5-9 = Mild depression, 10-14 = Moderate depression, 15-19 = Moderately severe depression, 20-27 = Severe depression   Psychosocial Evaluation and Intervention: Psychosocial Evaluation - 06/13/18 1500    Psychosocial Evaluation & Interventions          Interventions  Stress management education;Relaxation education;Encouraged to exercise with the program and follow exercise prescription    Comments  Pt with good support of family and freinds.  Pt generally feels  hopeful about his future.    Expected Outcomes  Pt will continue to display positive and healthy coping skills.    Continue Psychosocial Services   Follow up required by staff           Psychosocial Re-Evaluation: Psychosocial Re-Evaluation    Psychosocial Re-Evaluation    Row Name 06/27/18 1526 08/03/18 1620   Current issues with  None Identified  None Identified   Comments  no psychosocial needs identified, no interventions necessary.   no psychosocial needs identified, no interventions necessary.    Expected Outcomes  pt will exhibit positive outlook with good coping skills.   pt will exhibit positive outlook with good coping skills.    Interventions  Encouraged to attend Cardiac Rehabilitation for the exercise  Encouraged to attend Cardiac Rehabilitation for the exercise   Continue Psychosocial Services   No Follow up required  No Follow up required          Psychosocial Discharge (Final Psychosocial Re-Evaluation): Psychosocial Re-Evaluation - 08/03/18 1620    Psychosocial Re-Evaluation          Current issues with  None Identified    Comments  no psychosocial needs identified, no interventions necessary.     Expected Outcomes  pt will exhibit positive outlook with good coping skills.     Interventions  Encouraged to attend Cardiac Rehabilitation for the exercise    Continue Psychosocial Services   No Follow up required           Vocational Rehabilitation: Provide vocational rehab assistance to qualifying candidates.   Vocational Rehab Evaluation & Intervention: Vocational Rehab - 06/09/18 1052    Initial Vocational Rehab Evaluation & Intervention          Assessment shows need for Vocational Rehabilitation  No           Education: Education Goals: Education classes will be provided on a weekly basis, covering required topics. Participant will state understanding/return demonstration of topics presented.  Learning Barriers/Preferences: Learning  Barriers/Preferences - 06/09/18 1145    Learning Barriers/Preferences          Learning Barriers  None    Learning Preferences  Written Material;Skilled Demonstration;Individual Instruction           Education Topics: Count Your Pulse:  -Group instruction provided by verbal instruction, demonstration, patient participation and written materials to support subject.  Instructors address importance of being able to find your pulse and how to count your pulse when at home without a heart monitor.  Patients get hands on experience counting their pulse with staff help and individually. Flowsheet Row  CARDIAC REHAB PHASE II EXERCISE from 07/15/2018 in [facility] CARDIAC REHAB  Date  06/24/18  Instruction Review Code  2- Demonstrated Understanding      Heart Attack, Angina, and Risk Factor Modification:  -Group instruction provided by verbal instruction, video, and written materials to support subject.  Instructors address signs and symptoms of angina and heart attacks.    Also discuss risk factors for heart disease and how to make changes to improve heart health risk factors.   Functional Fitness:  -Group instruction provided by verbal instruction, demonstration, patient participation, and written materials to support subject.  Instructors address safety measures for doing things around the house.  Discuss how to get up and down off the floor, how to pick things up properly, how to safely get out of a chair without assistance, and balance training. Flowsheet Row CARDIAC REHAB PHASE II EXERCISE from 07/15/2018 in [facility] CARDIAC REHAB  Date  07/08/18  Educator  Warrick Parisian, EP  Instruction Review Code  2- Demonstrated Understanding      Meditation and Mindfulness:  -Group instruction provided by verbal instruction, patient participation, and written materials to support subject.  Instructor addresses importance of mindfulness and meditation practice to help  reduce stress and improve awareness.  Instructor also leads participants through a meditation exercise.    Stretching for Flexibility and Mobility:  -Group instruction provided by verbal instruction, patient participation, and written materials to support subject.  Instructors lead participants through series of stretches that are designed to increase flexibility thus improving mobility.  These stretches are additional exercise for major muscle groups that are typically performed during regular warm up and cool down.   Hands Only CPR:  -Group verbal, video, and participation provides a basic overview of AHA guidelines for community CPR. Role-play of emergencies allow participants the opportunity to practice calling for help and chest compression technique with discussion of AED use.   Hypertension: -Group verbal and written instruction that provides a basic overview of hypertension including the most recent diagnostic guidelines, risk factor reduction with self-care instructions and medication management. Flowsheet Row CARDIAC REHAB PHASE II EXERCISE from 07/15/2018 in [facility] CARDIAC REHAB  Date  07/15/18  Educator  RN  Instruction Review Code  2- Demonstrated Understanding       Nutrition I class: Heart Healthy Eating:  -Group instruction provided by PowerPoint slides, verbal discussion, and written materials to support subject matter. The instructor gives an explanation and review of the Therapeutic Lifestyle Changes diet recommendations, which includes a discussion on lipid goals, dietary fat, sodium, fiber, plant stanol/sterol esters, sugar, and the components of a well-balanced, healthy diet.   Nutrition II class: Lifestyle Skills:  -Group instruction provided by PowerPoint slides, verbal discussion, and written materials to support subject matter. The instructor gives an explanation and review of label reading, grocery shopping for heart health, heart healthy  recipe modifications, and ways to make healthier choices when eating out.   Diabetes Question & Answer:  -Group instruction provided by PowerPoint slides, verbal discussion, and written materials to support subject matter. The instructor gives an explanation and review of diabetes co-morbidities, pre- and post-prandial blood glucose goals, pre-exercise blood glucose goals, signs, symptoms, and treatment of hypoglycemia and hyperglycemia, and foot care basics.   Diabetes Blitz:  -Group instruction provided by PowerPoint slides, verbal discussion, and written materials to support subject matter. The instructor gives an explanation and review of the physiology behind type 1 and type 2  diabetes, diabetes medications and rational behind using different medications, pre- and post-prandial blood glucose recommendations and Hemoglobin A1c goals, diabetes diet, and exercise including blood glucose guidelines for exercising safely.    Portion Distortion:  -Group instruction provided by PowerPoint slides, verbal discussion, written materials, and food models to support subject matter. The instructor gives an explanation of serving size versus portion size, changes in portions sizes over the last 20 years, and what consists of a serving from each food group.   Stress Management:  -Group instruction provided by verbal instruction, video, and written materials to support subject matter.  Instructors review role of stress in heart disease and how to cope with stress positively.     Exercising on Your Own:  -Group instruction provided by verbal instruction, power point, and written materials to support subject.  Instructors discuss benefits of exercise, components of exercise, frequency and intensity of exercise, and end points for exercise.  Also discuss use of nitroglycerin and activating EMS.  Review options of places to exercise outside of rehab.  Review guidelines for sex with heart disease. Flowsheet Row  CARDIAC REHAB PHASE II EXERCISE from 07/15/2018 in [facility] CARDIAC REHAB  Date  06/22/18  Educator  EP  Instruction Review Code  2- Demonstrated Understanding      Cardiac Drugs I:  -Group instruction provided by verbal instruction and written materials to support subject.  Instructor reviews cardiac drug classes: antiplatelets, anticoagulants, beta blockers, and statins.  Instructor discusses reasons, side effects, and lifestyle considerations for each drug class. Flowsheet Row CARDIAC REHAB PHASE II EXERCISE from 07/15/2018 in [facility] CARDIAC REHAB  Date  07/06/18  Instruction Review Code  2- Demonstrated Understanding      Cardiac Drugs II:  -Group instruction provided by verbal instruction and written materials to support subject.  Instructor reviews cardiac drug classes: angiotensin converting enzyme inhibitors (ACE-I), angiotensin II receptor blockers (ARBs), nitrates, and calcium channel blockers.  Instructor discusses reasons, side effects, and lifestyle considerations for each drug class.   Anatomy and Physiology of the Circulatory System:  Group verbal and written instruction and models provide basic cardiac anatomy and physiology, with the coronary electrical and arterial systems. Review of: AMI, Angina, Valve disease, Heart Failure, Peripheral Artery Disease, Cardiac Arrhythmia, Pacemakers, and the ICD.   Other Education:  -Group or individual verbal, written, or video instructions that support the educational goals of the cardiac rehab program.   Holiday Eating Survival Tips:  -Group instruction provided by PowerPoint slides, verbal discussion, and written materials to support subject matter. The instructor gives patients tips, tricks, and techniques to help them not only survive but enjoy the holidays despite the onslaught of food that accompanies the holidays.   Knowledge Questionnaire Score: Knowledge Questionnaire Score -  06/09/18 1145    Knowledge Questionnaire Score          Pre Score  21/24           Core Components/Risk Factors/Patient Goals at Admission: Personal Goals and Risk Factors at Admission - 06/09/18 1139    Core Components/Risk Factors/Patient Goals on Admission           Weight Management  Yes;Weight Maintenance    Intervention  Weight Management: Develop a combined nutrition and exercise program designed to reach desired caloric intake, while maintaining appropriate intake of nutrient and fiber, sodium and fats, and appropriate energy expenditure required for the weight goal.;Weight Management: Provide education and appropriate resources to help participant work on  and attain dietary goals.;Weight Management/Obesity: Establish reasonable short term and long term weight goals.    Admit Weight  135 lb 9.3 oz (61.5 kg)    Goal Weight: Long Term  135 lb (61.2 kg)    Expected Outcomes  Short Term: Continue to assess and modify interventions until short term weight is achieved;Weight Maintenance: Understanding of the daily nutrition guidelines, which includes 25-35% calories from fat, 7% or less cal from saturated fats, less than 200mg  cholesterol, less than 1.5gm of sodium, & 5 or more servings of fruits and vegetables daily;Understanding recommendations for meals to include 15-35% energy as protein, 25-35% energy from fat, 35-60% energy from carbohydrates, less than 200mg  of dietary cholesterol, 20-35 gm of total fiber daily;Understanding of distribution of calorie intake throughout the day with the consumption of 4-5 meals/snacks    Hypertension  Yes    Intervention  Provide education on lifestyle modifcations including regular physical activity/exercise, weight management, moderate sodium restriction and increased consumption of fresh fruit, vegetables, and low fat dairy, alcohol moderation, and smoking cessation.;Monitor prescription use compliance.    Expected Outcomes  Short Term: Continued  assessment and intervention until BP is < 140/73mm HG in hypertensive participants. < 130/74mm HG in hypertensive participants with diabetes, heart failure or chronic kidney disease.;Long Term: Maintenance of blood pressure at goal levels.    Lipids  Yes    Intervention  Provide education and support for participant on nutrition & aerobic/resistive exercise along with prescribed medications to achieve LDL 70mg , HDL >40mg .    Expected Outcomes  Long Term: Cholesterol controlled with medications as prescribed, with individualized exercise RX and with personalized nutrition plan. Value goals: LDL < 70mg , HDL > 40 mg.;Short Term: Participant states understanding of desired cholesterol values and is compliant with medications prescribed. Participant is following exercise prescription and nutrition guidelines.    Stress  Yes    Intervention  Offer individual and/or small group education and counseling on adjustment to heart disease, stress management and health-related lifestyle change. Teach and support self-help strategies.;Refer participants experiencing significant psychosocial distress to appropriate mental health specialists for further evaluation and treatment. When possible, include family members and significant others in education/counseling sessions.    Expected Outcomes  Long Term: Emotional wellbeing is indicated by absence of clinically significant psychosocial distress or social isolation.;Short Term: Participant demonstrates changes in health-related behavior, relaxation and other stress management skills, ability to obtain effective social support, and compliance with psychotropic medications if prescribed.           Core Components/Risk Factors/Patient Goals Review:  Goals and Risk Factor Review    Core Components/Risk Factors/Patient Goals Review    Row Name 06/27/18 1523 08/03/18 1619   Personal Goals Review  Weight Management/Obesity;Hypertension;Lipids;Stress  Weight  Management/Obesity;Hypertension;Lipids;Stress   Review  pt with multiple CAD RF eager to participate in CR program. pt has increased walking for exercise.  pt has also incorporated situps into daily routine.   pt with multiple CAD RF eager to participate in CR program. pt has increased walking for exercise.  pt jogging at CR without difficulty.   Expected Outcomes  pt will participate in CR exercise, nutrition and lifestyle modification opportunities to decrease overall RF.   pt will participate in CR exercise, nutrition and lifestyle modification opportunities to decrease overall RF.           Core Components/Risk Factors/Patient Goals at Discharge (Final Review):  Goals and Risk Factor Review - 08/03/18 1619    Core Components/Risk Factors/Patient Goals  Review          Personal Goals Review  Weight Management/Obesity;Hypertension;Lipids;Stress    Review  pt with multiple CAD RF eager to participate in CR program. pt has increased walking for exercise.  pt jogging at CR without difficulty.    Expected Outcomes  pt will participate in CR exercise, nutrition and lifestyle modification opportunities to decrease overall RF.            ITP Comments: ITP Comments    Row Name 06/09/18 0850 06/27/18 1522 08/03/18 1618   ITP Comments  Dr. Armanda Magic, Medical Director   30 day ITP review. pt with good attendance and participation.   30 day ITP review. pt with good attendance and participation. pt displays eagerness to participate in CR activities       Comments:

## 2018-08-03 NOTE — Progress Notes (Addendum)
Cardiac Individual Treatment Plan  Patient Details  Name: Rodney Walker MRN: [id number] Date of Birth: 09/25/1981 Referring Provider:   Flowsheet Row CARDIAC REHAB PHASE II ORIENTATION from 06/09/2018 in [facility] CARDIAC [facility]  Referring Provider  [name], [name] MD       Initial Encounter Date:  Flowsheet Row CARDIAC REHAB PHASE II ORIENTATION from 06/09/2018 in [facility] CARDIAC REHAB  Date  06/09/18      Visit Diagnosis: Stented coronary artery  ST elevation myocardial infarction (STEMI), unspecified artery (HCC)  Patient's Home Medications on Admission:  Current Outpatient Medications:  .  albuterol (PROVENTIL HFA;VENTOLIN HFA) 108 (90 Base) MCG/ACT inhaler, Inhale 4 puffs into the lungs every 4 (four) hours as needed for wheezing or shortness of breath. (Patient not taking: Reported on 05/31/2018), Disp: 1 each, Rfl: 2 .  aspirin 81 MG EC tablet, Take 1 tablet (81 mg total) by mouth daily., Disp: 90 tablet, Rfl: 3 .  atorvastatin (LIPITOR) 40 MG tablet, Take 1 tablet (40 mg total) by mouth daily at 6 PM., Disp: 90 tablet, Rfl: 3 .  BIKTARVY 50-200-25 MG TABS tablet, TAKE 1 TABLET BY MOUTH DAILY, Disp: 30 tablet, Rfl: 5 .  cetirizine (ZYRTEC) 10 MG tablet, TAKE 1 TABLET(10 MG) BY MOUTH DAILY, Disp: 30 tablet, Rfl: 5 .  darunavir-cobicistat (PREZCOBIX) 800-150 MG tablet, Take 1 tablet daily with breakfast by mouth. Swallow whole. Do NOT crush, break or chew tablets. Take with food., Disp: 30 tablet, Rfl: 5 .  diclofenac sodium (VOLTAREN) 1 % GEL, Apply 2 g topically 4 (four) times daily., Disp: 100 g, Rfl: 0 .  lisinopril (PRINIVIL,ZESTRIL) 2.5 MG tablet, Take 1 tablet (2.5 mg total) by mouth daily., Disp: 90 tablet, Rfl: 3 .  methocarbamol (ROBAXIN) 500 MG tablet, Take 1 tablet (500 mg total) by mouth at bedtime as needed for muscle spasms. (Patient not taking: Reported on 05/31/2018), Disp: 20 tablet, Rfl: 0 .  metoprolol tartrate  (LOPRESSOR) 25 MG tablet, Take 0.5 tablets (12.5 mg total) by mouth 2 (two) times daily., Disp: 60 tablet, Rfl: 6 .  nitroGLYCERIN (NITROSTAT) 0.4 MG SL tablet, Place 1 tablet (0.4 mg total) under the tongue every 5 (five) minutes x 3 doses as needed for chest pain. (Patient not taking: Reported on 06/13/2018), Disp: 25 tablet, Rfl: 2  Past Medical History: Past Medical History:  Diagnosis Date  . COPD (chronic obstructive pulmonary disease) (HCC)   . Coronary artery disease    MI 2010 - dissection/plaque rupture in LAD tx with antiplt/antithrombotics >> relook cath with resolution (no PCI performed) // Inf STEMI 3/19: OM1 30; pRCA 100 >> DES // Echo 3/19:  mild LVH, EF 40-45, inf/inf-sept HK, trivial MR  . Eczema   . History of myocardial infarction 2010; 2019  . History of pleural empyema   . HIV infection (HCC)   . Hyperlipidemia   . Immune deficiency disorder (HCC)   . Ischemic cardiomyopathy 04/05/2018  . Moderate persistent asthma without complication 10/29/2016    Tobacco Use: Social History   Tobacco Use  Smoking Status Former Smoker  . Packs/day: 0.25  . Years: 17.00  . Pack years: 4.25  . Types: Cigarettes  . Last attempt to quit: 03/23/2015  . Years since quitting: 3.3  Smokeless Tobacco Never Used    Labs: Recent Review Advice worker    Labs for ITP Cardiac and Pulmonary Rehab Latest Ref Rng & Units 03/03/2016 04/13/2016 02/12/2017 03/24/2018 03/25/2018   Cholestrol 0 -  200 mg/dL - 454 098 119 147   LDLCALC 0 - 99 mg/dL - 829 562(Z) 308(M) 95   HDL >40 mg/dL - 41 44 45 57(Q)   Trlycerides <150 mg/dL - 80 77 469 629   Hemoglobin A1c 4.8 - 5.6 % 5.2 - - 5.0 -   PHART 7.350 - 7.450 - - - - -   PCO2ART 35.0 - 45.0 mmHg - - - - -   HCO3 20.0 - 24.0 mEq/L - - - - -   TCO2 0 - 100 mmol/L - - - - -   O2SAT % - - - - -      Capillary Blood Glucose: Lab Results  Component Value Date   GLUCAP 106 (H) 03/25/2018   GLUCAP 93 03/25/2018   GLUCAP 109 (H) 03/25/2018   GLUCAP  99 06/19/2009     Exercise Target Goals:    Exercise Program Goal: Individual exercise prescription set using results from initial 6 min walk test and THRR while considering  patient's activity barriers and safety.   Exercise Prescription Goal: Initial exercise prescription builds to 30-45 minutes a day of aerobic activity, 2-3 days per week.  Home exercise guidelines will be given to patient during program as part of exercise prescription that the participant will acknowledge.  Activity Barriers & Risk Stratification: Activity Barriers & Cardiac Risk Stratification - 06/09/18 0909    Activity Barriers & Cardiac Risk Stratification          Activity Barriers  Other (comment)    Comments  Chronic Right Hip Pain    Cardiac Risk Stratification  High           6 Minute Walk: 6 Minute Walk    6 Minute Walk    Row Name 06/09/18 1143   Phase  Initial   Distance  1743 feet   Walk Time  3 minutes   # of Rest Breaks  0   MPH  3.3   METS  5.67   RPE  7   Perceived Dyspnea   0   VO2 Peak  19.84   Symptoms  No   Resting HR  70 bpm   Resting BP  104/60   Resting Oxygen Saturation   97 %   Exercise Oxygen Saturation  during 6 min walk  98 %   Max Ex. HR  103 bpm   Max Ex. BP  118/72   2 Minute Post BP  102/58          Oxygen Initial Assessment:   Oxygen Re-Evaluation:   Oxygen Discharge (Final Oxygen Re-Evaluation):   Initial Exercise Prescription: Initial Exercise Prescription - 06/09/18 1100    Date of Initial Exercise RX and Referring Provider          Date  06/09/18    Referring Provider  [name], [name] MD         Treadmill          MPH  2.5    Grade  1    Minutes  10    METs  3.26        Bike          Level  1    Minutes  10    METs  4.14        NuStep          Level  3    SPM  75    Minutes  10    METs  3.5  Prescription Details          Frequency (times per week)  3x    Duration  Progress to 30 minutes of continuous aerobic  without signs/symptoms of physical distress        Intensity          THRR 40-80% of Max Heartrate  73-146    Ratings of Perceived Exertion  11-13    Perceived Dyspnea  0-4        Progression          Progression  Continue progressive overload as per policy without signs/symptoms or physical distress.        Resistance Training          Training Prescription  Yes    Weight  5lb    Reps  10-15           Perform Capillary Blood Glucose checks as needed.  Exercise Prescription Changes: Exercise Prescription Changes    Response to Exercise    Row Name 06/13/18 1600 06/27/18 1331 07/11/18 1542 07/27/18 1225 07/29/18 1200   Blood Pressure (Admit)  108/60  114/62  112/58  104/70  no documentation   Blood Pressure (Exercise)  160/86  160/70  128/82  136/72  no documentation   Blood Pressure (Exit)  102/78  124/70  110/60  100/60  no documentation   Heart Rate (Admit)  80 bpm  76 bpm  71 bpm  68 bpm  no documentation   Heart Rate (Exercise)  143 bpm  138 bpm  138 bpm  150 bpm  no documentation   Heart Rate (Exit)  77 bpm  76 bpm  73 bpm  76 bpm  no documentation   Rating of Perceived Exertion (Exercise)  11  13  14  13   no documentation   Perceived Dyspnea (Exercise)  0  0  0  no documentation  no documentation   Symptoms  None   None   None   None   no documentation   Comments  Pt oriented to exercise equipment   no documentation  Pt does weight machine for 10 minutes 40-60lbs   Pt does weight machine for 10 minutes 40-60lbs   no documentation   Duration  Progress to 30 minutes of  aerobic without signs/symptoms of physical distress  Progress to 30 minutes of  aerobic without signs/symptoms of physical distress  Progress to 30 minutes of  aerobic without signs/symptoms of physical distress  Progress to 30 minutes of  aerobic without signs/symptoms of physical distress  no documentation   Intensity  THRR unchanged  THRR unchanged  THRR unchanged  THRR unchanged  no documentation        Progression    Row Name 06/13/18 1600 06/27/18 1331 07/11/18 1542 07/27/18 1225 07/29/18 1200   Progression  Continue to progress workloads to maintain intensity without signs/symptoms of physical distress.  Continue to progress workloads to maintain intensity without signs/symptoms of physical distress.  Continue to progress workloads to maintain intensity without signs/symptoms of physical distress.  Continue to progress workloads to maintain intensity without signs/symptoms of physical distress.  no documentation   Average METs  3.9  7.2  7.5  7.6  no documentation       Resistance Training    Row Name 06/13/18 1600 06/27/18 1331 07/11/18 1542 07/27/18 1225 07/29/18 1200   Training Prescription  Yes  Yes  Yes  No relaxation day  no documentation   Weight  5lbs  6lbs  6lbs  no documentation  no documentation   Reps  10-15  10-15  10-15  no documentation  no documentation   Time  10 Minutes  10 Minutes  10 Minutes  10 Minutes  no documentation       Interval Training    Row Name 06/13/18 1600 06/27/18 1331 07/11/18 1542 07/27/18 1225 07/29/18 1200   Interval Training  No  No  Yes  Yes  no documentation   Equipment  no documentation  no documentation  Treadmill  Treadmill  no documentation   Comments  no documentation  no documentation  6'walking and 4' jogging  6'walking and 4' jogging  no documentation       Treadmill    Row Name 06/13/18 1600 06/27/18 1331 07/11/18 1542 07/27/18 1225 07/29/18 1200   MPH  2.7  3.5 jogging pace 5.2/0 for 4 min/ walking for 6 min total  3.5 jogging pace 5.2/3 for 4 min/ walking 3.5/3 for 6 min total  3.5 jogging pace 5.2/3 for 4 min/ walking 3.5/3 for 6 min total  no documentation   Grade  1  4  3  3   no documentation   Minutes  10  10  10  10   no documentation   METs  4.07  6.9  7  7   no documentation       Bike    Row Name 06/13/18 1600 06/27/18 1331 07/11/18 1542 07/27/18 1225 07/29/18 1200   Level  1  no documentation  no documentation  no  documentation  no documentation   Minutes  10  no documentation  no documentation  no documentation  no documentation   METs  4.2  no documentation  no documentation  no documentation  no documentation       NuStep    Row Name 06/13/18 1600 06/27/18 1331 07/11/18 1542 07/27/18 1225 07/29/18 1200   Level  3  4  no documentation  9 HIIT@ level 9 for 2 min and mod inten. @ level 6 for 8 min  no documentation   SPM  85  120  no documentation  145  no documentation   Minutes  10  10  no documentation  10  no documentation   METs  4.2  6.6  no documentation  8.1  no documentation       Rower    Row Name 06/13/18 1600 06/27/18 1331 07/11/18 1542 07/27/18 1225 07/29/18 1200   Level  no documentation  4  5  no documentation  no documentation   Watts  no documentation  67  68  no documentation  no documentation   Minutes  no documentation  10  10  no documentation  no documentation   METs  no documentation  8  8  no documentation  no documentation       Home Exercise Plan    Row Name 06/13/18 1600 06/27/18 1331 07/11/18 1542 07/27/18 1225 07/29/18 1200   Plans to continue exercise at  no documentation  Home (comment)  Home (comment)  Home (comment)  no documentation   Frequency  no documentation  Add 2 additional days to program exercise sessions.  Add 2 additional days to program exercise sessions.  Add 2 additional days to program exercise sessions.  no documentation   Initial Home Exercises Provided  no documentation  06/20/18  06/20/18  06/20/18  no documentation          Exercise Comments: Exercise Comments  Row Name 06/13/18 1634 06/28/18 1339 07/06/18 1159 07/22/18 1227   Exercise Comments  Pt off to a great start with exercise. Pt oriented to exercise equipment. Will continue to monitor and progress pt as tolerated.   Reviewed METs and goals. Pt is tolerating exercise prescription very well. Cardiac rehab staff will continue to monitor pt's intensity and activity levels.   Reviewed  METs and goals. Pt is tolerating exercise prescription very well. Cardiac rehab staff will continue to monitor pt's intensity and activity levels.   Reviewed METs and goals. Pt is tolerating exercise prescription very well. Cardiac rehab staff will continue to monitor pt's intensity and activity levels.       Exercise Goals and Review: Exercise Goals    Exercise Goals    Row Name 06/09/18 1144   Increase Physical Activity  Yes   Intervention  Develop an individualized exercise prescription for aerobic and resistive training based on initial evaluation findings, risk stratification, comorbidities and participant's personal goals.;Provide advice, education, support and counseling about physical activity/exercise needs.   Expected Outcomes  Long Term: Add in home exercise to make exercise part of routine and to increase amount of physical activity.;Short Term: Attend rehab on a regular basis to increase amount of physical activity.;Long Term: Exercising regularly at least 3-5 days a week.   Increase Strength and Stamina  Yes   Intervention  Provide advice, education, support and counseling about physical activity/exercise needs.;Develop an individualized exercise prescription for aerobic and resistive training based on initial evaluation findings, risk stratification, comorbidities and participant's personal goals.   Expected Outcomes  Long Term: Improve cardiorespiratory fitness, muscular endurance and strength as measured by increased METs and functional capacity ( );Short Term: Perform resistance training exercises routinely during rehab and add in resistance training at home;Short Term: Increase workloads from initial exercise prescription for resistance, speed, and METs.   Able to understand and use rate of perceived exertion (RPE) scale  Yes   Intervention  Provide education and explanation on how to use RPE scale   Expected Outcomes  Short Term: Able to use RPE daily in rehab to express  subjective intensity level;Long Term:  Able to use RPE to guide intensity level when exercising independently   Knowledge and understanding of Target Heart Rate Range (THRR)  Yes   Intervention  Provide education and explanation of THRR including how the numbers were predicted and where they are located for reference   Expected Outcomes  Short Term: Able to use daily as guideline for intensity in rehab;Long Term: Able to use THRR to govern intensity when exercising independently;Short Term: Able to state/look up THRR   Able to check pulse independently  Yes   Intervention  Provide education and demonstration on how to check pulse in carotid and radial arteries.;Review the importance of being able to check your own pulse for safety during independent exercise   Expected Outcomes  Short Term: Able to explain why pulse checking is important during independent exercise;Long Term: Able to check pulse independently and accurately   Understanding of Exercise Prescription  Yes   Intervention  Provide education, explanation, and written materials on patient's individual exercise prescription   Expected Outcomes  Long Term: Able to explain home exercise prescription to exercise independently;Short Term: Able to explain program exercise prescription          Exercise Goals Re-Evaluation : Exercise Goals Re-Evaluation    Exercise Goal Re-Evaluation    Row Name 06/20/18 1151 07/06/18 1159 07/20/18 1619   Exercise  Goals Review  Increase Physical Activity;Knowledge and understanding of Target Heart Rate Range (THRR);Able to understand and use rate of perceived exertion (RPE) scale;Understanding of Exercise Prescription;Increase Strength and Stamina;Able to check pulse independently  Increase Physical Activity;Knowledge and understanding of Target Heart Rate Range (THRR);Able to understand and use rate of perceived exertion (RPE) scale;Understanding of Exercise Prescription;Increase Strength and Stamina;Able to  check pulse independently  Increase Physical Activity;Knowledge and understanding of Target Heart Rate Range (THRR);Able to understand and use rate of perceived exertion (RPE) scale;Understanding of Exercise Prescription;Increase Strength and Stamina;Able to check pulse independently   Comments  Reviewed home exercise with pt today.  Pt plans to walk for exercise, in addition to coming to cardiac rehab.  Discussed with patient indoor options for hot and humid days. Reviewed activity limitations and temperature precautions. Pt voiced understanding.  Pt is now jogging and doing the rower machine in cardiac rehab. Pt has noticed an increase in strength and stamina since starting cardiac rehab. Pt handles WL increases very well and enjoys the variety of exercises being offered in cardiac rehab.  Pt is jogging intermittently at home and in cardiac rehab.Ppt also does weight training and ab exercises to increase muscle mass.   Expected Outcomes  Pt will continue to improve in cardiorespiratory fitness by coming to cardiac rehab and walking in neighborhood   Pt will continue to improve in cardiorespiratory fitness by coming to cardiac rehab and walking/ jogging in neighborhood   Pt will continue to improve in cardiorespiratory fitness and musculoskeletal endurance.           Discharge Exercise Prescription (Final Exercise Prescription Changes): Exercise Prescription Changes - 07/29/18 1200    Response to Exercise          Blood Pressure (Admit)  --    Blood Pressure (Exercise)  --    Blood Pressure (Exit)  --    Heart Rate (Admit)  --    Heart Rate (Exercise)  --    Heart Rate (Exit)  --    Rating of Perceived Exertion (Exercise)  --    Perceived Dyspnea (Exercise)  --    Symptoms  --    Comments  --    Duration  --    Intensity  --        Progression          Progression  --    Average METs  --        Resistance Training          Training Prescription  --    Weight  --    Reps  --     Time  --        Interval Training          Interval Training  --    Equipment  --    Comments  --        Treadmill          MPH  --    Grade  --    Minutes  --    METs  --        Rower          Level  --    Watts  --    Minutes  --    METs  --        Home Exercise Plan          Plans to continue exercise at  --    Frequency  --  Initial Home Exercises Provided  --           Nutrition:  Target Goals: Understanding of nutrition guidelines, daily intake of sodium 1500mg , cholesterol 200mg , calories 30% from fat and 7% or less from saturated fats, daily to have 5 or more servings of fruits and vegetables.  Biometrics: Pre Biometrics - 06/09/18 1144    Pre Biometrics          Height  5' 5.5" (1.664 m)    Weight  135 lb 9.3 oz (61.5 kg)    Waist Circumference  28 inches    Hip Circumference  30.5 inches    Waist to Hip Ratio  0.92 %    BMI (Calculated)  22.21    Triceps Skinfold  6 mm    % Body Fat  15.4 %    Grip Strength  48 kg    Flexibility  13 in    Single Leg Stand  30 seconds            Nutrition Therapy Plan and Nutrition Goals: Nutrition Therapy & Goals - 06/09/18 1245    Nutrition Therapy          Diet  heart healthy           Nutrition Assessments: Nutrition Assessments - 06/09/18 1245    MEDFICTS Scores          Pre Score  49           Nutrition Goals Re-Evaluation:   Nutrition Goals Re-Evaluation:   Nutrition Goals Discharge (Final Nutrition Goals Re-Evaluation):   Psychosocial: Target Goals: Acknowledge presence or absence of significant depression and/or stress, maximize coping skills, provide positive support system. Participant is able to verbalize types and ability to use techniques and skills needed for reducing stress and depression.  Initial Review & Psychosocial Screening: Initial Psych Review & Screening - 06/09/18 1052    Initial Review          Current issues with  Current Stress Concerns    Source  of Stress Concerns  Transportation    Comments  Eriel inquired about bus vouchers.  Contact information given for Medicaid transportation help.        Family Dynamics          Good Support System?  Yes Natanael lists his family and friends as his support system.        Barriers          Psychosocial barriers to participate in program  There are no identifiable barriers or psychosocial needs.        Screening Interventions          Interventions  Encouraged to exercise           Quality of Life Scores: Quality of Life - 06/09/18 1146    Quality of Life Scores          Health/Function Pre  27.2 %    Socioeconomic Pre  21 %    Psych/Spiritual Pre  28.29 %    Family Pre  27.6 %    GLOBAL Pre  26.06 %          Scores of 19 and below usually indicate a poorer quality of life in these areas.  A difference of  2-3 points is a clinically meaningful difference.  A difference of 2-3 points in the total score of the Quality of Life Index has been associated with significant improvement in overall quality of life, self-image, physical symptoms, and  general health in studies assessing change in quality of life.  PHQ-9: Recent Review Flowsheet Data    Depression screen [facility] 2/9 06/13/2018 05/05/2018 04/27/2018 09/29/2017 06/29/2017   Decreased Interest 0 0 0 0 0   Down, Depressed, Hopeless 0 0 0 0 0   PHQ - 2 Score 0 0 0 0 0     Interpretation of Total Score  Total Score Depression Severity:  1-4 = Minimal depression, 5-9 = Mild depression, 10-14 = Moderate depression, 15-19 = Moderately severe depression, 20-27 = Severe depression   Psychosocial Evaluation and Intervention: Psychosocial Evaluation - 06/13/18 1500    Psychosocial Evaluation & Interventions          Interventions  Stress management education;Relaxation education;Encouraged to exercise with the program and follow exercise prescription    Comments  Pt with good support of family and freinds.  Pt generally feels  hopeful about his future.    Expected Outcomes  Pt will continue to display positive and healthy coping skills.    Continue Psychosocial Services   Follow up required by staff           Psychosocial Re-Evaluation: Psychosocial Re-Evaluation    Psychosocial Re-Evaluation    Row Name 06/27/18 1526 08/03/18 1620   Current issues with  None Identified  None Identified   Comments  no psychosocial needs identified, no interventions necessary.   no psychosocial needs identified, no interventions necessary.    Expected Outcomes  pt will exhibit positive outlook with good coping skills.   pt will exhibit positive outlook with good coping skills.    Interventions  Encouraged to attend Cardiac Rehabilitation for the exercise  Encouraged to attend Cardiac Rehabilitation for the exercise   Continue Psychosocial Services   No Follow up required  No Follow up required          Psychosocial Discharge (Final Psychosocial Re-Evaluation): Psychosocial Re-Evaluation - 08/03/18 1620    Psychosocial Re-Evaluation          Current issues with  None Identified    Comments  no psychosocial needs identified, no interventions necessary.     Expected Outcomes  pt will exhibit positive outlook with good coping skills.     Interventions  Encouraged to attend Cardiac Rehabilitation for the exercise    Continue Psychosocial Services   No Follow up required           Vocational Rehabilitation: Provide vocational rehab assistance to qualifying candidates.   Vocational Rehab Evaluation & Intervention: Vocational Rehab - 06/09/18 1052    Initial Vocational Rehab Evaluation & Intervention          Assessment shows need for Vocational Rehabilitation  No           Education: Education Goals: Education classes will be provided on a weekly basis, covering required topics. Participant will state understanding/return demonstration of topics presented.  Learning Barriers/Preferences: Learning  Barriers/Preferences - 06/09/18 1145    Learning Barriers/Preferences          Learning Barriers  None    Learning Preferences  Written Material;Skilled Demonstration;Individual Instruction           Education Topics: Count Your Pulse:  -Group instruction provided by verbal instruction, demonstration, patient participation and written materials to support subject.  Instructors address importance of being able to find your pulse and how to count your pulse when at home without a heart monitor.  Patients get hands on experience counting their pulse with staff help and individually. Flowsheet Row  CARDIAC REHAB PHASE II EXERCISE from 07/15/2018 in [facility] CARDIAC REHAB  Date  06/24/18  Instruction Review Code  2- Demonstrated Understanding      Heart Attack, Angina, and Risk Factor Modification:  -Group instruction provided by verbal instruction, video, and written materials to support subject.  Instructors address signs and symptoms of angina and heart attacks.    Also discuss risk factors for heart disease and how to make changes to improve heart health risk factors.   Functional Fitness:  -Group instruction provided by verbal instruction, demonstration, patient participation, and written materials to support subject.  Instructors address safety measures for doing things around the house.  Discuss how to get up and down off the floor, how to pick things up properly, how to safely get out of a chair without assistance, and balance training. Flowsheet Row CARDIAC REHAB PHASE II EXERCISE from 07/15/2018 in [facility] CARDIAC REHAB  Date  07/08/18  Educator  Warrick Parisian, EP  Instruction Review Code  2- Demonstrated Understanding      Meditation and Mindfulness:  -Group instruction provided by verbal instruction, patient participation, and written materials to support subject.  Instructor addresses importance of mindfulness and meditation practice to help  reduce stress and improve awareness.  Instructor also leads participants through a meditation exercise.    Stretching for Flexibility and Mobility:  -Group instruction provided by verbal instruction, patient participation, and written materials to support subject.  Instructors lead participants through series of stretches that are designed to increase flexibility thus improving mobility.  These stretches are additional exercise for major muscle groups that are typically performed during regular warm up and cool down.   Hands Only CPR:  -Group verbal, video, and participation provides a basic overview of AHA guidelines for community CPR. Role-play of emergencies allow participants the opportunity to practice calling for help and chest compression technique with discussion of AED use.   Hypertension: -Group verbal and written instruction that provides a basic overview of hypertension including the most recent diagnostic guidelines, risk factor reduction with self-care instructions and medication management. Flowsheet Row CARDIAC REHAB PHASE II EXERCISE from 07/15/2018 in [facility] CARDIAC REHAB  Date  07/15/18  Educator  RN  Instruction Review Code  2- Demonstrated Understanding       Nutrition I class: Heart Healthy Eating:  -Group instruction provided by PowerPoint slides, verbal discussion, and written materials to support subject matter. The instructor gives an explanation and review of the Therapeutic Lifestyle Changes diet recommendations, which includes a discussion on lipid goals, dietary fat, sodium, fiber, plant stanol/sterol esters, sugar, and the components of a well-balanced, healthy diet.   Nutrition II class: Lifestyle Skills:  -Group instruction provided by PowerPoint slides, verbal discussion, and written materials to support subject matter. The instructor gives an explanation and review of label reading, grocery shopping for heart health, heart healthy  recipe modifications, and ways to make healthier choices when eating out.   Diabetes Question & Answer:  -Group instruction provided by PowerPoint slides, verbal discussion, and written materials to support subject matter. The instructor gives an explanation and review of diabetes co-morbidities, pre- and post-prandial blood glucose goals, pre-exercise blood glucose goals, signs, symptoms, and treatment of hypoglycemia and hyperglycemia, and foot care basics.   Diabetes Blitz:  -Group instruction provided by PowerPoint slides, verbal discussion, and written materials to support subject matter. The instructor gives an explanation and review of the physiology behind type 1 and type 2  diabetes, diabetes medications and rational behind using different medications, pre- and post-prandial blood glucose recommendations and Hemoglobin A1c goals, diabetes diet, and exercise including blood glucose guidelines for exercising safely.    Portion Distortion:  -Group instruction provided by PowerPoint slides, verbal discussion, written materials, and food models to support subject matter. The instructor gives an explanation of serving size versus portion size, changes in portions sizes over the last 20 years, and what consists of a serving from each food group.   Stress Management:  -Group instruction provided by verbal instruction, video, and written materials to support subject matter.  Instructors review role of stress in heart disease and how to cope with stress positively.     Exercising on Your Own:  -Group instruction provided by verbal instruction, power point, and written materials to support subject.  Instructors discuss benefits of exercise, components of exercise, frequency and intensity of exercise, and end points for exercise.  Also discuss use of nitroglycerin and activating EMS.  Review options of places to exercise outside of rehab.  Review guidelines for sex with heart disease. Flowsheet Row  CARDIAC REHAB PHASE II EXERCISE from 07/15/2018 in [facility] CARDIAC REHAB  Date  06/22/18  Educator  EP  Instruction Review Code  2- Demonstrated Understanding      Cardiac Drugs I:  -Group instruction provided by verbal instruction and written materials to support subject.  Instructor reviews cardiac drug classes: antiplatelets, anticoagulants, beta blockers, and statins.  Instructor discusses reasons, side effects, and lifestyle considerations for each drug class. Flowsheet Row CARDIAC REHAB PHASE II EXERCISE from 07/15/2018 in [facility] CARDIAC REHAB  Date  07/06/18  Instruction Review Code  2- Demonstrated Understanding      Cardiac Drugs II:  -Group instruction provided by verbal instruction and written materials to support subject.  Instructor reviews cardiac drug classes: angiotensin converting enzyme inhibitors (ACE-I), angiotensin II receptor blockers (ARBs), nitrates, and calcium channel blockers.  Instructor discusses reasons, side effects, and lifestyle considerations for each drug class.   Anatomy and Physiology of the Circulatory System:  Group verbal and written instruction and models provide basic cardiac anatomy and physiology, with the coronary electrical and arterial systems. Review of: AMI, Angina, Valve disease, Heart Failure, Peripheral Artery Disease, Cardiac Arrhythmia, Pacemakers, and the ICD.   Other Education:  -Group or individual verbal, written, or video instructions that support the educational goals of the cardiac rehab program.   Holiday Eating Survival Tips:  -Group instruction provided by PowerPoint slides, verbal discussion, and written materials to support subject matter. The instructor gives patients tips, tricks, and techniques to help them not only survive but enjoy the holidays despite the onslaught of food that accompanies the holidays.   Knowledge Questionnaire Score: Knowledge Questionnaire Score -  06/09/18 1145    Knowledge Questionnaire Score          Pre Score  21/24           Core Components/Risk Factors/Patient Goals at Admission: Personal Goals and Risk Factors at Admission - 06/09/18 1139    Core Components/Risk Factors/Patient Goals on Admission           Weight Management  Yes;Weight Maintenance    Intervention  Weight Management: Develop a combined nutrition and exercise program designed to reach desired caloric intake, while maintaining appropriate intake of nutrient and fiber, sodium and fats, and appropriate energy expenditure required for the weight goal.;Weight Management: Provide education and appropriate resources to help participant work on  and attain dietary goals.;Weight Management/Obesity: Establish reasonable short term and long term weight goals.    Admit Weight  135 lb 9.3 oz (61.5 kg)    Goal Weight: Long Term  135 lb (61.2 kg)    Expected Outcomes  Short Term: Continue to assess and modify interventions until short term weight is achieved;Weight Maintenance: Understanding of the daily nutrition guidelines, which includes 25-35% calories from fat, 7% or less cal from saturated fats, less than 200mg  cholesterol, less than 1.5gm of sodium, & 5 or more servings of fruits and vegetables daily;Understanding recommendations for meals to include 15-35% energy as protein, 25-35% energy from fat, 35-60% energy from carbohydrates, less than 200mg  of dietary cholesterol, 20-35 gm of total fiber daily;Understanding of distribution of calorie intake throughout the day with the consumption of 4-5 meals/snacks    Hypertension  Yes    Intervention  Provide education on lifestyle modifcations including regular physical activity/exercise, weight management, moderate sodium restriction and increased consumption of fresh fruit, vegetables, and low fat dairy, alcohol moderation, and smoking cessation.;Monitor prescription use compliance.    Expected Outcomes  Short Term: Continued  assessment and intervention until BP is < 140/56mm HG in hypertensive participants. < 130/14mm HG in hypertensive participants with diabetes, heart failure or chronic kidney disease.;Long Term: Maintenance of blood pressure at goal levels.    Lipids  Yes    Intervention  Provide education and support for participant on nutrition & aerobic/resistive exercise along with prescribed medications to achieve LDL 70mg , HDL >40mg .    Expected Outcomes  Long Term: Cholesterol controlled with medications as prescribed, with individualized exercise RX and with personalized nutrition plan. Value goals: LDL < 70mg , HDL > 40 mg.;Short Term: Participant states understanding of desired cholesterol values and is compliant with medications prescribed. Participant is following exercise prescription and nutrition guidelines.    Stress  Yes    Intervention  Offer individual and/or small group education and counseling on adjustment to heart disease, stress management and health-related lifestyle change. Teach and support self-help strategies.;Refer participants experiencing significant psychosocial distress to appropriate mental health specialists for further evaluation and treatment. When possible, include family members and significant others in education/counseling sessions.    Expected Outcomes  Long Term: Emotional wellbeing is indicated by absence of clinically significant psychosocial distress or social isolation.;Short Term: Participant demonstrates changes in health-related behavior, relaxation and other stress management skills, ability to obtain effective social support, and compliance with psychotropic medications if prescribed.           Core Components/Risk Factors/Patient Goals Review:  Goals and Risk Factor Review    Core Components/Risk Factors/Patient Goals Review    Row Name 06/27/18 1523 08/03/18 1619   Personal Goals Review  Weight Management/Obesity;Hypertension;Lipids;Stress  Weight  Management/Obesity;Hypertension;Lipids;Stress   Review  pt with multiple CAD RF eager to participate in CR program. pt has increased walking for exercise.  pt has also incorporated situps into daily routine.   pt with multiple CAD RF eager to participate in CR program. pt has increased walking for exercise.  pt jogging at CR without difficulty.   Expected Outcomes  pt will participate in CR exercise, nutrition and lifestyle modification opportunities to decrease overall RF.   pt will participate in CR exercise, nutrition and lifestyle modification opportunities to decrease overall RF.           Core Components/Risk Factors/Patient Goals at Discharge (Final Review):  Goals and Risk Factor Review - 08/03/18 1619    Core Components/Risk Factors/Patient Goals  Review          Personal Goals Review  Weight Management/Obesity;Hypertension;Lipids;Stress    Review  pt with multiple CAD RF eager to participate in CR program. pt has increased walking for exercise.  pt jogging at CR without difficulty.    Expected Outcomes  pt will participate in CR exercise, nutrition and lifestyle modification opportunities to decrease overall RF.            ITP Comments: ITP Comments    Row Name 06/09/18 0850 06/27/18 1522 08/03/18 1618   ITP Comments  Dr. Armanda Magic, Medical Director   30 day ITP review. pt with good attendance and participation.   30 day ITP review. pt with good attendance and participation. pt displays eagerness to participate in CR activities       Comments:

## 2018-08-05 ENCOUNTER — Encounter (HOSPITAL_COMMUNITY): Payer: Medicare Other

## 2018-08-05 ENCOUNTER — Encounter (HOSPITAL_COMMUNITY)
Admission: RE | Admit: 2018-08-05 | Discharge: 2018-08-05 | Disposition: A | Discharge status: Home / Self Care | Payer: Medicare Other | Source: Ambulatory Visit | Attending: Cardiovascular Disease | Admitting: Cardiovascular Disease

## 2018-08-05 DIAGNOSIS — I2111 ST elevation (STEMI) myocardial infarction involving right coronary artery: Secondary | ICD-10-CM | POA: Insufficient documentation

## 2018-08-05 DIAGNOSIS — Z955 Presence of coronary angioplasty implant and graft: Secondary | ICD-10-CM | POA: Diagnosis not present

## 2018-08-05 DIAGNOSIS — Z48812 Encounter for surgical aftercare following surgery on the circulatory system: Secondary | ICD-10-CM | POA: Diagnosis not present

## 2018-08-05 DIAGNOSIS — I213 ST elevation (STEMI) myocardial infarction of unspecified site: Secondary | ICD-10-CM

## 2018-08-08 ENCOUNTER — Encounter (HOSPITAL_COMMUNITY)
Admission: RE | Admit: 2018-08-08 | Discharge: 2018-08-08 | Disposition: A | Discharge status: Home / Self Care | Payer: Medicare Other | Source: Ambulatory Visit | Attending: Cardiovascular Disease | Admitting: Cardiovascular Disease

## 2018-08-08 ENCOUNTER — Encounter (HOSPITAL_COMMUNITY): Payer: Medicare Other

## 2018-08-08 ENCOUNTER — Encounter: Payer: Self-pay | Admitting: Internal Medicine

## 2018-08-08 ENCOUNTER — Ambulatory Visit (INDEPENDENT_AMBULATORY_CARE_PROVIDER_SITE_OTHER): Payer: Medicare Other | Admitting: Internal Medicine

## 2018-08-08 VITALS — BP 103/64 | HR 75 | Temp 98.6°F | Wt 136.8 lb

## 2018-08-08 DIAGNOSIS — M25551 Pain in right hip: Secondary | ICD-10-CM | POA: Diagnosis not present

## 2018-08-08 DIAGNOSIS — Z23 Encounter for immunization: Secondary | ICD-10-CM | POA: Diagnosis not present

## 2018-08-08 DIAGNOSIS — Z48812 Encounter for surgical aftercare following surgery on the circulatory system: Secondary | ICD-10-CM | POA: Diagnosis not present

## 2018-08-08 DIAGNOSIS — Z955 Presence of coronary angioplasty implant and graft: Secondary | ICD-10-CM | POA: Diagnosis not present

## 2018-08-08 DIAGNOSIS — I2111 ST elevation (STEMI) myocardial infarction involving right coronary artery: Secondary | ICD-10-CM | POA: Diagnosis not present

## 2018-08-08 DIAGNOSIS — I255 Ischemic cardiomyopathy: Secondary | ICD-10-CM

## 2018-08-08 DIAGNOSIS — B20 Human immunodeficiency virus [HIV] disease: Secondary | ICD-10-CM

## 2018-08-08 DIAGNOSIS — I213 ST elevation (STEMI) myocardial infarction of unspecified site: Secondary | ICD-10-CM

## 2018-08-08 DIAGNOSIS — M8700 Idiopathic aseptic necrosis of unspecified bone: Secondary | ICD-10-CM

## 2018-08-08 NOTE — Patient Instructions (Signed)
Need RN visit for 2nd HPV vaccine plus flu shot in 4 wks. We will see you back in 3 months

## 2018-08-08 NOTE — Progress Notes (Signed)
RFV: follow up -HIV  Patient ID: Rodney Walker, male   DOB: Jul 25, 1981, 37 y.o.   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 He reports doing well with adherence. Continues to participate in cardiac rehab. He states that he has hip pain and takes tylenol once maybe twice a day. He was seen by Dr Roda Shutters from orthopedics who gave option to either try conservative management vs. Getting hip replacement for AVN. Patient is still considering options at this time.   Outpatient Encounter Medications as of 08/08/2018  Medication Sig  . albuterol (PROVENTIL HFA;VENTOLIN HFA) 108 (90 Base) MCG/ACT inhaler Inhale 4 puffs into the lungs every 4 (four) hours as needed for wheezing or shortness of breath. (Patient not taking: Reported on 05/31/2018)  . aspirin 81 MG EC tablet Take 1 tablet (81 mg total) by mouth daily.  Marland Kitchen atorvastatin (LIPITOR) 40 MG tablet Take 1 tablet (40 mg total) by mouth daily at 6 PM.  . BIKTARVY 50-200-25 MG TABS tablet TAKE 1 TABLET BY MOUTH DAILY  . cetirizine (ZYRTEC) 10 MG tablet TAKE 1 TABLET(10 MG) BY MOUTH DAILY  . darunavir-cobicistat (PREZCOBIX) 800-150 MG tablet Take 1 tablet daily with breakfast by mouth. Swallow whole. Do NOT crush, break or chew tablets. Take with food.  . diclofenac sodium (VOLTAREN) 1 % GEL Apply 2 g topically 4 (four) times daily.  Marland Kitchen lisinopril (PRINIVIL,ZESTRIL) 2.5 MG tablet Take 1 tablet (2.5 mg total) by mouth daily.  . methocarbamol (ROBAXIN) 500 MG tablet Take 1 tablet (500 mg total) by mouth at bedtime as needed for muscle spasms. (Patient not taking: Reported on 05/31/2018)  . metoprolol tartrate (LOPRESSOR) 25 MG tablet Take 0.5 tablets (12.5 mg total) by mouth 2 (two) times daily.  . nitroGLYCERIN (NITROSTAT) 0.4 MG SL tablet Place 1 tablet (0.4 mg total) under the tongue every 5 (five) minutes x 3 doses as  needed for chest pain. (Patient not taking: Reported on 06/13/2018)   No facility-administered encounter medications on file as of 08/08/2018.      Patient Active Problem List   Diagnosis Date Noted  . Hyperlipidemia 04/05/2018  . Ischemic cardiomyopathy 04/05/2018  . History of acute inferior wall MI 03/24/2018  . Marijuana abuse, continuous 10/29/2016  . Moderate persistent asthma without complication 10/29/2016  . Allergic rhinitis 10/08/2016  . Anemia 05/03/2015  . Lung abscess (HCC) 04/22/2015  . Sepsis due to pneumonia (HCC) 04/17/2015  . Bullous emphysema (HCC)   . Lobar pneumonia (HCC)   . Empyema lung (HCC) 04/16/2015  . HIV (human immunodeficiency virus infection) (HCC)   . Tobacco abuse 03/21/2015  . Lung mass 08/31/2014  . 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 Myalgia 04/23/2014  . AIDS (HCC) 03/22/2014  . CAP (community acquired pneumonia) 03/22/2014  . CAD (coronary artery disease) 11/13/2011     Health Maintenance Due  Topic Date Due  . INFLUENZA VACCINE  07/28/2018     Review of Systems + hip pain, otherwise 12 point ros is negative Physical Exam   BP 103/64   Pulse 75   Temp 98.6 F (37 C)   Wt 136 lb 12.8 oz (62.1 kg)   BMI 22.42 kg/m   Physical Exam  Constitutional: He is oriented to person, place, and time. He appears well-developed and well-nourished. No distress.  HENT:  Mouth/Throat: Oropharynx is clear and moist. No  oropharyngeal exudate.  Cardiovascular: Normal rate, regular rhythm and normal heart sounds. Exam reveals no gallop and no friction rub.  No murmur heard.  Pulmonary/Chest: Effort normal and breath sounds normal. No respiratory distress. He has no wheezes.  Abdominal: Soft. Bowel sounds are normal. He exhibits no distension. There is no tenderness.  Lymphadenopathy:  He has no cervical adenopathy.  Neurological: He is alert and oriented to person, place, and time.  Skin: Skin is warm and dry. No rash noted. No erythema.    Psychiatric: He has a normal mood and affect. His behavior is normal.    Lab Results  Component Value Date   CD4TCELL 24 (L) 05/05/2018   Lab Results  Component Value Date   CD4TABS 660 05/05/2018   CD4TABS 620 05/31/2017   CD4TABS 490 01/28/2017   Lab Results  Component Value Date   HIV1RNAQUANT 50 (H) 05/05/2018   No results found for: HEPBSAB Lab Results  Component Value Date   LABRPR NON-REACTIVE 05/05/2018    CBC Lab Results  Component Value Date   WBC 7.4 05/05/2018   RBC 4.18 (L) 05/05/2018   HGB 13.3 05/05/2018   HCT 38.1 (L) 05/05/2018   PLT 254 05/05/2018   MCV 91.1 05/05/2018   MCH 31.8 05/05/2018   MCHC 34.9 05/05/2018   RDW 13.8 05/05/2018   LYMPHSABS 2,679 05/05/2018   MONOABS 1.2 (H) 03/24/2018   EOSABS 37 05/05/2018    BMET Lab Results  Component Value Date   NA 138 05/18/2018   K 4.1 05/18/2018   CL 100 05/18/2018   CO2 25 05/18/2018   GLUCOSE 65 05/18/2018   BUN 14 05/18/2018   CREATININE 0.98 05/18/2018   CALCIUM 9.1 05/18/2018   GFRNONAA 98 05/18/2018   GFRAA 113 05/18/2018      Assessment and Plan  hiv disease = continue on biktarvy. 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 In the meantime ok to use tylenol gave precautions

## 2018-08-09 LAB — T-HELPER CELL (CD4) - (RCID CLINIC ONLY)
CD4 % Helper T Cell: 23 % — ABNORMAL LOW (ref 33–55)
CD4 T Cell Abs: 640 /uL (ref 400–2700)

## 2018-08-10 ENCOUNTER — Encounter (HOSPITAL_COMMUNITY): Payer: Medicare Other

## 2018-08-10 ENCOUNTER — Encounter (HOSPITAL_COMMUNITY)
Admission: RE | Admit: 2018-08-10 | Discharge: 2018-08-10 | Disposition: A | Discharge status: Home / Self Care | Payer: Medicare Other | Source: Ambulatory Visit | Attending: Cardiovascular Disease | Admitting: Cardiovascular Disease

## 2018-08-10 LAB — COMPLETE METABOLIC PANEL WITH GFR
AG Ratio: 1.4 (calc) (ref 1.0–2.5)
ALBUMIN MSPROF: 3.9 g/dL (ref 3.6–5.1)
ALT: 18 U/L (ref 9–46)
AST: 22 U/L (ref 10–40)
Alkaline phosphatase (APISO): 87 U/L (ref 40–115)
BILIRUBIN TOTAL: 0.3 mg/dL (ref 0.2–1.2)
BUN: 14 mg/dL (ref 7–25)
CO2: 29 mmol/L (ref 20–32)
Calcium: 9.1 mg/dL (ref 8.6–10.3)
Chloride: 107 mmol/L (ref 98–110)
Creat: 1.2 mg/dL (ref 0.60–1.35)
GFR, Est African American: 89 mL/min/{1.73_m2} (ref >=60)
GFR, Est Non African American: 77 mL/min/{1.73_m2} (ref >=60)
GLOBULIN: 2.8 g/dL (ref 1.9–3.7)
GLUCOSE: 99 mg/dL (ref 65–99)
Potassium: 4.6 mmol/L (ref 3.5–5.3)
Sodium: 139 mmol/L (ref 135–146)
Total Protein: 6.7 g/dL (ref 6.1–8.1)

## 2018-08-10 LAB — HIV-1 RNA QUANT-NO REFLEX-BLD
HIV 1 RNA Quant: 97 copies/mL — ABNORMAL HIGH
HIV-1 RNA Quant, Log: 1.99 Log copies/mL — ABNORMAL HIGH

## 2018-08-10 LAB — CBC WITH DIFFERENTIAL/PLATELET
BASOS PCT: 0.3 %
Basophils Absolute: 21 cells/uL (ref 0–200)
Eosinophils Absolute: 133 cells/uL (ref 15–500)
Eosinophils Relative: 1.9 %
HCT: 37.2 % — ABNORMAL LOW (ref 38.5–50.0)
Hemoglobin: 13.1 g/dL — ABNORMAL LOW (ref 13.2–17.1)
Lymphs Abs: 2695 cells/uL (ref 850–3900)
MCH: 32.8 pg (ref 27.0–33.0)
MCHC: 35.2 g/dL (ref 32.0–36.0)
MCV: 93.2 fL (ref 80.0–100.0)
MONOS PCT: 7 %
MPV: 10.2 fL (ref 7.5–12.5)
NEUTROS PCT: 52.3 %
Neutro Abs: 3661 cells/uL (ref 1500–7800)
PLATELETS: 225 10*3/uL (ref 140–400)
RBC: 3.99 10*6/uL — AB (ref 4.20–5.80)
RDW: 14.3 % (ref 11.0–15.0)
TOTAL LYMPHOCYTE: 38.5 %
WBC mixed population: 490 cells/uL (ref 200–950)
WBC: 7 10*3/uL (ref 3.8–10.8)

## 2018-08-12 ENCOUNTER — Encounter (HOSPITAL_COMMUNITY)
Admission: RE | Admit: 2018-08-12 | Discharge: 2018-08-12 | Disposition: A | Discharge status: Home / Self Care | Payer: Medicare Other | Source: Ambulatory Visit | Attending: Cardiovascular Disease | Admitting: Cardiovascular Disease

## 2018-08-12 ENCOUNTER — Encounter (HOSPITAL_COMMUNITY): Payer: Medicare Other

## 2018-08-12 DIAGNOSIS — I213 ST elevation (STEMI) myocardial infarction of unspecified site: Secondary | ICD-10-CM

## 2018-08-12 DIAGNOSIS — Z955 Presence of coronary angioplasty implant and graft: Secondary | ICD-10-CM | POA: Diagnosis not present

## 2018-08-12 DIAGNOSIS — I2111 ST elevation (STEMI) myocardial infarction involving right coronary artery: Secondary | ICD-10-CM | POA: Diagnosis not present

## 2018-08-12 DIAGNOSIS — Z48812 Encounter for surgical aftercare following surgery on the circulatory system: Secondary | ICD-10-CM | POA: Diagnosis not present

## 2018-08-15 ENCOUNTER — Encounter (HOSPITAL_COMMUNITY)
Admission: RE | Admit: 2018-08-15 | Discharge: 2018-08-15 | Disposition: A | Discharge status: Home / Self Care | Payer: Medicare Other | Source: Ambulatory Visit | Attending: Cardiovascular Disease | Admitting: Cardiovascular Disease

## 2018-08-15 ENCOUNTER — Encounter (HOSPITAL_COMMUNITY): Payer: Medicare Other

## 2018-08-15 DIAGNOSIS — Z955 Presence of coronary angioplasty implant and graft: Secondary | ICD-10-CM | POA: Diagnosis not present

## 2018-08-15 DIAGNOSIS — Z48812 Encounter for surgical aftercare following surgery on the circulatory system: Secondary | ICD-10-CM | POA: Diagnosis not present

## 2018-08-15 DIAGNOSIS — I213 ST elevation (STEMI) myocardial infarction of unspecified site: Secondary | ICD-10-CM

## 2018-08-15 DIAGNOSIS — I2111 ST elevation (STEMI) myocardial infarction involving right coronary artery: Secondary | ICD-10-CM | POA: Diagnosis not present

## 2018-08-17 ENCOUNTER — Encounter (HOSPITAL_COMMUNITY)
Admission: RE | Admit: 2018-08-17 | Discharge: 2018-08-17 | Disposition: A | Discharge status: Home / Self Care | Payer: Medicare Other | Source: Ambulatory Visit | Attending: Cardiovascular Disease | Admitting: Cardiovascular Disease

## 2018-08-17 ENCOUNTER — Encounter (HOSPITAL_COMMUNITY): Payer: Medicare Other

## 2018-08-17 DIAGNOSIS — Z955 Presence of coronary angioplasty implant and graft: Secondary | ICD-10-CM

## 2018-08-17 DIAGNOSIS — I2111 ST elevation (STEMI) myocardial infarction involving right coronary artery: Secondary | ICD-10-CM | POA: Diagnosis not present

## 2018-08-17 DIAGNOSIS — Z48812 Encounter for surgical aftercare following surgery on the circulatory system: Secondary | ICD-10-CM | POA: Diagnosis not present

## 2018-08-17 DIAGNOSIS — I213 ST elevation (STEMI) myocardial infarction of unspecified site: Secondary | ICD-10-CM

## 2018-08-18 ENCOUNTER — Ambulatory Visit: Payer: Medicare Other | Admitting: Registered"

## 2018-08-19 ENCOUNTER — Encounter (HOSPITAL_COMMUNITY): Payer: Medicare Other

## 2018-08-22 ENCOUNTER — Encounter (HOSPITAL_COMMUNITY): Payer: Medicare Other

## 2018-08-24 ENCOUNTER — Encounter (HOSPITAL_COMMUNITY): Payer: Medicare Other

## 2018-08-24 ENCOUNTER — Encounter (HOSPITAL_COMMUNITY)
Admission: RE | Admit: 2018-08-24 | Discharge: 2018-08-24 | Disposition: A | Discharge status: Home / Self Care | Payer: Medicare Other | Source: Ambulatory Visit | Attending: Cardiovascular Disease | Admitting: Cardiovascular Disease

## 2018-08-24 DIAGNOSIS — Z48812 Encounter for surgical aftercare following surgery on the circulatory system: Secondary | ICD-10-CM | POA: Diagnosis not present

## 2018-08-24 DIAGNOSIS — Z955 Presence of coronary angioplasty implant and graft: Secondary | ICD-10-CM | POA: Diagnosis not present

## 2018-08-24 DIAGNOSIS — I2111 ST elevation (STEMI) myocardial infarction involving right coronary artery: Secondary | ICD-10-CM | POA: Diagnosis not present

## 2018-08-24 DIAGNOSIS — I213 ST elevation (STEMI) myocardial infarction of unspecified site: Secondary | ICD-10-CM

## 2018-08-26 ENCOUNTER — Encounter (HOSPITAL_COMMUNITY): Payer: Self-pay

## 2018-08-26 ENCOUNTER — Encounter (HOSPITAL_COMMUNITY)
Admission: RE | Admit: 2018-08-26 | Discharge: 2018-08-26 | Disposition: A | Discharge status: Home / Self Care | Payer: Medicare Other | Source: Ambulatory Visit | Attending: Cardiovascular Disease | Admitting: Cardiovascular Disease

## 2018-08-26 ENCOUNTER — Encounter (HOSPITAL_COMMUNITY): Payer: Medicare Other

## 2018-08-26 VITALS — Ht 65.5 in | Wt 132.5 lb

## 2018-08-26 DIAGNOSIS — Z48812 Encounter for surgical aftercare following surgery on the circulatory system: Secondary | ICD-10-CM | POA: Diagnosis not present

## 2018-08-26 DIAGNOSIS — I2111 ST elevation (STEMI) myocardial infarction involving right coronary artery: Secondary | ICD-10-CM | POA: Diagnosis not present

## 2018-08-26 DIAGNOSIS — Z955 Presence of coronary angioplasty implant and graft: Secondary | ICD-10-CM

## 2018-08-26 DIAGNOSIS — I213 ST elevation (STEMI) myocardial infarction of unspecified site: Secondary | ICD-10-CM

## 2018-08-31 ENCOUNTER — Encounter (HOSPITAL_COMMUNITY): Payer: Medicare Other

## 2018-08-31 ENCOUNTER — Encounter (HOSPITAL_COMMUNITY): Payer: Self-pay

## 2018-09-01 NOTE — Progress Notes (Addendum)
Discharge Progress Report  Patient Details  Name: Rodney Walker MRN: [id number] Date of Birth: 1981/12/15 Referring Provider:   Flowsheet Row CARDIAC REHAB PHASE II ORIENTATION from 06/09/2018 in [facility] CARDIAC [facility]  Referring Provider  [name], [name] MD        Number of Visits: 26   Reason for Discharge:  Patient reached a stable level of exercise.  Smoking History:  Social History   Tobacco Use  Smoking Status Former Smoker  . Packs/day: 0.25  . Years: 17.00  . Pack years: 4.25  . Types: Cigarettes  . Last attempt to quit: 03/23/2015  . Years since quitting: 3.4  Smokeless Tobacco Never Used    Diagnosis:  ST elevation myocardial infarction (STEMI), unspecified artery (HCC)  Stented coronary artery  ADL [facility]:   Initial Exercise Prescription: Initial Exercise Prescription - 06/09/18 1100    Date of Initial Exercise RX and Referring Provider          Date  06/09/18    Referring Provider  [name], [name] MD         Treadmill          MPH  2.5    Grade  1    Minutes  10    METs  3.26        Bike          Level  1    Minutes  10    METs  4.14        NuStep          Level  3    SPM  75    Minutes  10    METs  3.5        Prescription Details          Frequency (times per week)  3x    Duration  Progress to 30 minutes of continuous aerobic without signs/symptoms of physical distress        Intensity          THRR 40-80% of Max Heartrate  73-146    Ratings of Perceived Exertion  11-13    Perceived Dyspnea  0-4        Progression          Progression  Continue progressive overload as per policy without signs/symptoms or physical distress.        Resistance Training          Training Prescription  Yes    Weight  5lb    Reps  10-15           Discharge Exercise Prescription (Final Exercise Prescription Changes): Exercise Prescription Changes - 08/26/18 0718    Response to Exercise           Blood Pressure (Admit)  120/70    Blood Pressure (Exercise)  142/82    Blood Pressure (Exit)  102/78    Heart Rate (Admit)  63 bpm    Heart Rate (Exercise)  140 bpm    Heart Rate (Exit)  73 bpm    Rating of Perceived Exertion (Exercise)  13    Perceived Dyspnea (Exercise)  0    Symptoms  None     Comments  Pt graduated from Cardiac Rehab    Duration  Progress to 30 minutes of  aerobic without signs/symptoms of physical distress    Intensity  THRR unchanged        Progression  Progression  Continue to progress workloads to maintain intensity without signs/symptoms of physical distress.    Average METs  6.5        Resistance Training          Training Prescription  Yes    Weight  9lbs    Reps  10-15    Time  10 Minutes        Interval Training          Interval Training  No   d/c HIIT and jogging due to hip pain       Treadmill          MPH  3.5    Grade  3    Minutes  10    METs  5.13        NuStep          Level  6    SPM  135    Minutes  10    METs  7.9        Home Exercise Plan          Plans to continue exercise at  Maintenance    Frequency  Add 3 additional days to program exercise sessions.    Initial Home Exercises Provided  06/20/18           Functional Capacity: 6 Minute Walk    6 Minute Walk    Row Name 06/09/18 1143 09/01/18 0710   Phase  Initial  Discharge   Distance  1743 feet  2461 feet   Distance % Change  no documentation  41.19 %   Distance Feet Change  no documentation  718 ft   Walk Time  3 minutes  6 minutes   # of Rest Breaks  0  0   MPH  3.3  4.7   METS  5.67  7.3   RPE  7  10   Perceived Dyspnea   0  0   VO2 Peak  19.84  25.6   Symptoms  No  No   Resting HR  70 bpm  77 bpm   Resting BP  104/60  104/60   Resting Oxygen Saturation   97 %  no documentation   Exercise Oxygen Saturation  during 6 min walk  98 %  no documentation   Max Ex. HR  103 bpm  137 bpm   Max Ex. BP  118/72  126/70   2 Minute Post BP   102/58  120/70          Psychological, QOL, Others - Outcomes: PHQ 2/9: Depression screen [facility] 2/9 08/31/2018 08/26/2018 08/08/2018 06/13/2018 05/05/2018  Decreased Interest 0 0 0 0 0  Down, Depressed, Hopeless 0 0 0 0 0  PHQ - 2 Score 0 0 0 0 0  Altered sleeping - - - - -  Tired, decreased energy - - - - -  Change in appetite - - - - -  Feeling bad or failure about yourself  - - - - -  Trouble concentrating - - - - -  Moving slowly or fidgety/restless - - - - -  Suicidal thoughts - - - - -  PHQ-9 Score - - - - -  Some recent data might be hidden    Quality of Life: Quality of Life - 09/01/18 0723    Quality of Life          Select  Quality of Life        Quality of  Life Scores          Health/Function Pre  27.2 %    Health/Function Post  30 %    Health/Function % Change  10.29 %    Socioeconomic Pre  21 %    Socioeconomic Post  30 %    Socioeconomic % Change   42.86 %    Psych/Spiritual Pre  28.29 %    Psych/Spiritual Post  30 %    Psych/Spiritual % Change  6.04 %    Family Pre  27.6 %    Family Post  30 %    Family % Change  8.7 %    GLOBAL Pre  26.06 %    GLOBAL Post  30 %    GLOBAL % Change  15.12 %           Personal Goals: Goals established at orientation with interventions provided to work toward goal. Personal Goals and Risk Factors at Admission - 06/09/18 1139    Core Components/Risk Factors/Patient Goals on Admission           Weight Management  Yes;Weight Maintenance    Intervention  Weight Management: Develop a combined nutrition and exercise program designed to reach desired caloric intake, while maintaining appropriate intake of nutrient and fiber, sodium and fats, and appropriate energy expenditure required for the weight goal.;Weight Management: Provide education and appropriate resources to help participant work on and attain dietary goals.;Weight Management/Obesity: Establish reasonable short term and long term weight goals.    Admit Weight  135 lb  9.3 oz (61.5 kg)    Goal Weight: Long Term  135 lb (61.2 kg)    Expected Outcomes  Short Term: Continue to assess and modify interventions until short term weight is achieved;Weight Maintenance: Understanding of the daily nutrition guidelines, which includes 25-35% calories from fat, 7% or less cal from saturated fats, less than 200mg  cholesterol, less than 1.5gm of sodium, & 5 or more servings of fruits and vegetables daily;Understanding recommendations for meals to include 15-35% energy as protein, 25-35% energy from fat, 35-60% energy from carbohydrates, less than 200mg  of dietary cholesterol, 20-35 gm of total fiber daily;Understanding of distribution of calorie intake throughout the day with the consumption of 4-5 meals/snacks    Hypertension  Yes    Intervention  Provide education on lifestyle modifcations including regular physical activity/exercise, weight management, moderate sodium restriction and increased consumption of fresh fruit, vegetables, and low fat dairy, alcohol moderation, and smoking cessation.;Monitor prescription use compliance.    Expected Outcomes  Short Term: Continued assessment and intervention until BP is < 140/91mm HG in hypertensive participants. < 130/2mm HG in hypertensive participants with diabetes, heart failure or chronic kidney disease.;Long Term: Maintenance of blood pressure at goal levels.    Lipids  Yes    Intervention  Provide education and support for participant on nutrition & aerobic/resistive exercise along with prescribed medications to achieve LDL 70mg , HDL >40mg .    Expected Outcomes  Long Term: Cholesterol controlled with medications as prescribed, with individualized exercise RX and with personalized nutrition plan. Value goals: LDL < 70mg , HDL > 40 mg.;Short Term: Participant states understanding of desired cholesterol values and is compliant with medications prescribed. Participant is following exercise prescription and nutrition guidelines.    Stress   Yes    Intervention  Offer individual and/or small group education and counseling on adjustment to heart disease, stress management and health-related lifestyle change. Teach and support self-help strategies.;Refer participants experiencing significant psychosocial distress to appropriate mental  health specialists for further evaluation and treatment. When possible, include family members and significant others in education/counseling sessions.    Expected Outcomes  Long Term: Emotional wellbeing is indicated by absence of clinically significant psychosocial distress or social isolation.;Short Term: Participant demonstrates changes in health-related behavior, relaxation and other stress management skills, ability to obtain effective social support, and compliance with psychotropic medications if prescribed.            Personal Goals Discharge: Goals and Risk Factor Review    Core Components/Risk Factors/Patient Goals Review    Row Name 06/27/18 1523 08/03/18 1619 08/26/18 1043   Personal Goals Review  Weight Management/Obesity;Hypertension;Lipids;Stress  Weight Management/Obesity;Hypertension;Lipids;Stress  Weight Management/Obesity;Hypertension;Lipids;Stress   Review  pt with multiple CAD RF eager to participate in CR program. pt has increased walking for exercise.  pt has also incorporated situps into daily routine.   pt with multiple CAD RF eager to participate in CR program. pt has increased walking for exercise.  pt jogging at CR without difficulty.  pt with multiple CAD RF demonstrates eagerness to participate in aerobic activity. pt hip pain limits funcational ability at times. Reinforced importance of stretching to improve hip flexability and range of motion.  pt plans to continue exercising on his own until he is able to start cardiac maintenance program.     Expected Outcomes  pt will participate in CR exercise, nutrition and lifestyle modification opportunities to decrease overall RF.   pt  will participate in CR exercise, nutrition and lifestyle modification opportunities to decrease overall RF.   pt will continue exercise, nutrition and lifestyle modification practices in the community.            Exercise Goals and Review: Exercise Goals    Exercise Goals    Row Name 06/09/18 1144   Increase Physical Activity  Yes   Intervention  Develop an individualized exercise prescription for aerobic and resistive training based on initial evaluation findings, risk stratification, comorbidities and participant's personal goals.;Provide advice, education, support and counseling about physical activity/exercise needs.   Expected Outcomes  Long Term: Add in home exercise to make exercise part of routine and to increase amount of physical activity.;Short Term: Attend rehab on a regular basis to increase amount of physical activity.;Long Term: Exercising regularly at least 3-5 days a week.   Increase Strength and Stamina  Yes   Intervention  Provide advice, education, support and counseling about physical activity/exercise needs.;Develop an individualized exercise prescription for aerobic and resistive training based on initial evaluation findings, risk stratification, comorbidities and participant's personal goals.   Expected Outcomes  Long Term: Improve cardiorespiratory fitness, muscular endurance and strength as measured by increased METs and functional capacity ( );Short Term: Perform resistance training exercises routinely during rehab and add in resistance training at home;Short Term: Increase workloads from initial exercise prescription for resistance, speed, and METs.   Able to understand and use rate of perceived exertion (RPE) scale  Yes   Intervention  Provide education and explanation on how to use RPE scale   Expected Outcomes  Short Term: Able to use RPE daily in rehab to express subjective intensity level;Long Term:  Able to use RPE to guide intensity level when exercising  independently   Knowledge and understanding of Target Heart Rate Range (THRR)  Yes   Intervention  Provide education and explanation of THRR including how the numbers were predicted and where they are located for reference   Expected Outcomes  Short Term: Able to use daily as guideline  for intensity in rehab;Long Term: Able to use THRR to govern intensity when exercising independently;Short Term: Able to state/look up THRR   Able to check pulse independently  Yes   Intervention  Provide education and demonstration on how to check pulse in carotid and radial arteries.;Review the importance of being able to check your own pulse for safety during independent exercise   Expected Outcomes  Short Term: Able to explain why pulse checking is important during independent exercise;Long Term: Able to check pulse independently and accurately   Understanding of Exercise Prescription  Yes   Intervention  Provide education, explanation, and written materials on patient's individual exercise prescription   Expected Outcomes  Long Term: Able to explain home exercise prescription to exercise independently;Short Term: Able to explain program exercise prescription          Nutrition & Weight - Outcomes: Pre Biometrics - 06/09/18 1144    Pre Biometrics          Height  5' 5.5" (1.664 m)    Weight  61.5 kg    Waist Circumference  28 inches    Hip Circumference  30.5 inches    Waist to Hip Ratio  0.92 %    BMI (Calculated)  22.21    Triceps Skinfold  6 mm    % Body Fat  15.4 %    Grip Strength  48 kg    Flexibility  13 in    Single Leg Stand  30 seconds          Post Biometrics - 09/01/18 0711     Post  Biometrics          Height  5' 5.5" (1.664 m)    Weight  60.1 kg    Waist Circumference  27 inches    Hip Circumference  31 inches    Waist to Hip Ratio  0.87 %    BMI (Calculated)  21.71    Triceps Skinfold  5 mm    % Body Fat  14 %    Grip Strength  41 kg    Flexibility  13.5 in    Single  Leg Stand  35 seconds           Nutrition: Nutrition Therapy & Goals - 09/01/18 0755    Nutrition Therapy          Diet  heart healthy        Personal Nutrition Goals          Nutrition Goal  pt to identify and limit saturated fat, trans fat, refined carbohydrates, and sodium        Intervention Plan          Intervention  Prescribe, educate and counsel regarding individualized specific dietary modifications aiming towards targeted core components such as weight, hypertension, lipid management, diabetes, heart failure and other comorbidities.    Expected Outcomes  Short Term Goal: Understand basic principles of dietary content, such as calories, fat, sodium, cholesterol and nutrients.           Nutrition Discharge: Nutrition Assessments - 09/01/18 0754    MEDFICTS Scores          Pre Score  49    Post Score  12    Score Difference  -37           Education Questionnaire Score: Knowledge Questionnaire Score - 09/01/18 0723    Knowledge Questionnaire Score          Pre Score  21/24  Post Score  22/24           Goals reviewed with patient; copy given to patient.

## 2018-09-02 ENCOUNTER — Encounter (HOSPITAL_COMMUNITY): Payer: Medicare Other

## 2018-09-05 ENCOUNTER — Encounter (HOSPITAL_COMMUNITY): Payer: Medicare Other

## 2018-09-05 ENCOUNTER — Ambulatory Visit (INDEPENDENT_AMBULATORY_CARE_PROVIDER_SITE_OTHER): Payer: Medicare Other | Admitting: Behavioral Health

## 2018-09-05 DIAGNOSIS — B20 Human immunodeficiency virus [HIV] disease: Secondary | ICD-10-CM

## 2018-09-05 DIAGNOSIS — Z23 Encounter for immunization: Secondary | ICD-10-CM | POA: Diagnosis not present

## 2018-09-05 NOTE — Progress Notes (Signed)
Patient tolerated injection well. Angeline Slim RN

## 2018-09-07 ENCOUNTER — Encounter (HOSPITAL_COMMUNITY): Payer: Medicare Other

## 2018-09-07 NOTE — Progress Notes (Signed)
Discharge Progress Report  Patient Details  Name: Rodney Walker MRN: [id number] Date of Birth: 15-Apr-1981 Referring Provider:   Flowsheet Row CARDIAC REHAB PHASE II ORIENTATION from 06/09/2018 in [facility]ARDI[facility]  Referring Provider  [name], [name] MD        Number of Visits: 26  Reason for Discharge:  Patient independent in their exercise.  Smoking History:  Social History   Tobacco Use  Smoking Status Former Smoker  . Packs/day: 0.25  . Years: 17.00  . Pack years: 4.25  . Types: Cigarettes  . Last attempt to quit: 03/23/2015  . Years since quitting: 3.4  Smokeless Tobacco Never Used    Diagnosis:  ST elevation myocardial infarction (STEMI), unspecified artery (HCC)  Stented coronary artery  ADL [facility]:   Initial Exercise Prescription: Initial Exercise Prescription - 06/09/18 1100    Date of Initial Exercise RX and Referring Provider          Date  06/09/18    Referring Provider  [name], [name] MD         Treadmill          MPH  2.5    Grade  1    Minutes  10    METs  3.26        Bike          Level  1    Minutes  10    METs  4.14        NuStep          Level  3    SPM  75    Minutes  10    METs  3.5        Prescription Details          Frequency (times per week)  3x    Duration  Progress to 30 minutes of continuous aerobic without signs/symptoms of physical distress        Intensity          THRR 40-80% of Max Heartrate  73-146    Ratings of Perceived Exertion  11-13    Perceived Dyspnea  0-4        Progression          Progression  Continue progressive overload as per policy without signs/symptoms or physical distress.        Resistance Training          Training Prescription  Yes    Weight  5lb    Reps  10-15           Discharge Exercise Prescription (Final Exercise Prescription Changes): Exercise Prescription Changes - 08/26/18 0718    Response to Exercise          Blood  Pressure (Admit)  120/70    Blood Pressure (Exercise)  142/82    Blood Pressure (Exit)  102/78    Heart Rate (Admit)  63 bpm    Heart Rate (Exercise)  140 bpm    Heart Rate (Exit)  73 bpm    Rating of Perceived Exertion (Exercise)  13    Perceived Dyspnea (Exercise)  0    Symptoms  None     Comments  Pt graduated from Cardiac Rehab    Duration  Progress to 30 minutes of  aerobic without signs/symptoms of physical distress    Intensity  THRR unchanged        Progression          Progression  Continue  to progress workloads to maintain intensity without signs/symptoms of physical distress.    Average METs  6.5        Resistance Training          Training Prescription  Yes    Weight  9lbs    Reps  10-15    Time  10 Minutes        Interval Training          Interval Training  No   d/c HIIT and jogging due to hip pain       Treadmill          MPH  3.5    Grade  3    Minutes  10    METs  5.13        NuStep          Level  6    SPM  135    Minutes  10    METs  7.9        Home Exercise Plan          Plans to continue exercise at  Maintenance    Frequency  Add 3 additional days to program exercise sessions.    Initial Home Exercises Provided  06/20/18           Functional Capacity: 6 Minute Walk    6 Minute Walk    Row Name 06/09/18 1143 09/01/18 0710   Phase  Initial  Discharge   Distance  1743 feet  2461 feet   Distance % Change  no documentation  41.19 %   Distance Feet Change  no documentation  718 ft   Walk Time  3 minutes  6 minutes   # of Rest Breaks  0  0   MPH  3.3  4.7   METS  5.67  7.3   RPE  7  10   Perceived Dyspnea   0  0   VO2 Peak  19.84  25.6   Symptoms  No  No   Resting HR  70 bpm  77 bpm   Resting BP  104/60  104/60   Resting Oxygen Saturation   97 %  no documentation   Exercise Oxygen Saturation  during 6 min walk  98 %  no documentation   Max Ex. HR  103 bpm  137 bpm   Max Ex. BP  118/72  126/70   2 Minute Post BP  102/58   120/70          Psychological, QOL, Others - Outcomes: PHQ 2/9: Depression screen [facility] 2/9 08/31/2018 08/26/2018 08/08/2018 06/13/2018 05/05/2018  Decreased Interest 0 0 0 0 0  Down, Depressed, Hopeless 0 0 0 0 0  PHQ - 2 Score 0 0 0 0 0  Altered sleeping - - - - -  Tired, decreased energy - - - - -  Change in appetite - - - - -  Feeling bad or failure about yourself  - - - - -  Trouble concentrating - - - - -  Moving slowly or fidgety/restless - - - - -  Suicidal thoughts - - - - -  PHQ-9 Score - - - - -  Some recent data might be hidden    Quality of Life: Quality of Life - 09/01/18 0723    Quality of Life          Select  Quality of Life        Quality of Life Scores  Health/Function Pre  27.2 %    Health/Function Post  30 %    Health/Function % Change  10.29 %    Socioeconomic Pre  21 %    Socioeconomic Post  30 %    Socioeconomic % Change   42.86 %    Psych/Spiritual Pre  28.29 %    Psych/Spiritual Post  30 %    Psych/Spiritual % Change  6.04 %    Family Pre  27.6 %    Family Post  30 %    Family % Change  8.7 %    GLOBAL Pre  26.06 %    GLOBAL Post  30 %    GLOBAL % Change  15.12 %           Personal Goals: Goals established at orientation with interventions provided to work toward goal. Personal Goals and Risk Factors at Admission - 06/09/18 1139    Core Components/Risk Factors/Patient Goals on Admission           Weight Management  Yes;Weight Maintenance    Intervention  Weight Management: Develop a combined nutrition and exercise program designed to reach desired caloric intake, while maintaining appropriate intake of nutrient and fiber, sodium and fats, and appropriate energy expenditure required for the weight goal.;Weight Management: Provide education and appropriate resources to help participant work on and attain dietary goals.;Weight Management/Obesity: Establish reasonable short term and long term weight goals.    Admit Weight  135 lb 9.3 oz  (61.5 kg)    Goal Weight: Long Term  135 lb (61.2 kg)    Expected Outcomes  Short Term: Continue to assess and modify interventions until short term weight is achieved;Weight Maintenance: Understanding of the daily nutrition guidelines, which includes 25-35% calories from fat, 7% or less cal from saturated fats, less than 200mg  cholesterol, less than 1.5gm of sodium, & 5 or more servings of fruits and vegetables daily;Understanding recommendations for meals to include 15-35% energy as protein, 25-35% energy from fat, 35-60% energy from carbohydrates, less than 200mg  of dietary cholesterol, 20-35 gm of total fiber daily;Understanding of distribution of calorie intake throughout the day with the consumption of 4-5 meals/snacks    Hypertension  Yes    Intervention  Provide education on lifestyle modifcations including regular physical activity/exercise, weight management, moderate sodium restriction and increased consumption of fresh fruit, vegetables, and low fat dairy, alcohol moderation, and smoking cessation.;Monitor prescription use compliance.    Expected Outcomes  Short Term: Continued assessment and intervention until BP is < 140/23mm HG in hypertensive participants. < 130/27mm HG in hypertensive participants with diabetes, heart failure or chronic kidney disease.;Long Term: Maintenance of blood pressure at goal levels.    Lipids  Yes    Intervention  Provide education and support for participant on nutrition & aerobic/resistive exercise along with prescribed medications to achieve LDL 70mg , HDL >40mg .    Expected Outcomes  Long Term: Cholesterol controlled with medications as prescribed, with individualized exercise RX and with personalized nutrition plan. Value goals: LDL < 70mg , HDL > 40 mg.;Short Term: Participant states understanding of desired cholesterol values and is compliant with medications prescribed. Participant is following exercise prescription and nutrition guidelines.    Stress  Yes     Intervention  Offer individual and/or small group education and counseling on adjustment to heart disease, stress management and health-related lifestyle change. Teach and support self-help strategies.;Refer participants experiencing significant psychosocial distress to appropriate mental health specialists for further evaluation and treatment. When possible, include family  members and significant others in education/counseling sessions.    Expected Outcomes  Long Term: Emotional wellbeing is indicated by absence of clinically significant psychosocial distress or social isolation.;Short Term: Participant demonstrates changes in health-related behavior, relaxation and other stress management skills, ability to obtain effective social support, and compliance with psychotropic medications if prescribed.            Personal Goals Discharge: Goals and Risk Factor Review    Core Components/Risk Factors/Patient Goals Review    Row Name 06/27/18 1523 08/03/18 1619 08/26/18 1043   Personal Goals Review  Weight Management/Obesity;Hypertension;Lipids;Stress  Weight Management/Obesity;Hypertension;Lipids;Stress  Weight Management/Obesity;Hypertension;Lipids;Stress   Review  pt with multiple CAD RF eager to participate in CR program. pt has increased walking for exercise.  pt has also incorporated situps into daily routine.   pt with multiple CAD RF eager to participate in CR program. pt has increased walking for exercise.  pt jogging at CR without difficulty.  pt with multiple CAD RF demonstrates eagerness to participate in aerobic activity. pt hip pain limits funcational ability at times. Reinforced importance of stretching to improve hip flexability and range of motion.  pt plans to continue exercising on his own until he is able to start cardiac maintenance program.     Expected Outcomes  pt will participate in CR exercise, nutrition and lifestyle modification opportunities to decrease overall RF.   pt will  participate in CR exercise, nutrition and lifestyle modification opportunities to decrease overall RF.   pt will continue exercise, nutrition and lifestyle modification practices in the community.            Exercise Goals and Review: Exercise Goals    Exercise Goals    Row Name 06/09/18 1144   Increase Physical Activity  Yes   Intervention  Develop an individualized exercise prescription for aerobic and resistive training based on initial evaluation findings, risk stratification, comorbidities and participant's personal goals.;Provide advice, education, support and counseling about physical activity/exercise needs.   Expected Outcomes  Long Term: Add in home exercise to make exercise part of routine and to increase amount of physical activity.;Short Term: Attend rehab on a regular basis to increase amount of physical activity.;Long Term: Exercising regularly at least 3-5 days a week.   Increase Strength and Stamina  Yes   Intervention  Provide advice, education, support and counseling about physical activity/exercise needs.;Develop an individualized exercise prescription for aerobic and resistive training based on initial evaluation findings, risk stratification, comorbidities and participant's personal goals.   Expected Outcomes  Long Term: Improve cardiorespiratory fitness, muscular endurance and strength as measured by increased METs and functional capacity ( );Short Term: Perform resistance training exercises routinely during rehab and add in resistance training at home;Short Term: Increase workloads from initial exercise prescription for resistance, speed, and METs.   Able to understand and use rate of perceived exertion (RPE) scale  Yes   Intervention  Provide education and explanation on how to use RPE scale   Expected Outcomes  Short Term: Able to use RPE daily in rehab to express subjective intensity level;Long Term:  Able to use RPE to guide intensity level when exercising  independently   Knowledge and understanding of Target Heart Rate Range (THRR)  Yes   Intervention  Provide education and explanation of THRR including how the numbers were predicted and where they are located for reference   Expected Outcomes  Short Term: Able to use daily as guideline for intensity in rehab;Long Term: Able to use THRR to govern  intensity when exercising independently;Short Term: Able to state/look up THRR   Able to check pulse independently  Yes   Intervention  Provide education and demonstration on how to check pulse in carotid and radial arteries.;Review the importance of being able to check your own pulse for safety during independent exercise   Expected Outcomes  Short Term: Able to explain why pulse checking is important during independent exercise;Long Term: Able to check pulse independently and accurately   Understanding of Exercise Prescription  Yes   Intervention  Provide education, explanation, and written materials on patient's individual exercise prescription   Expected Outcomes  Long Term: Able to explain home exercise prescription to exercise independently;Short Term: Able to explain program exercise prescription          Nutrition & Weight - Outcomes: Pre Biometrics - 06/09/18 1144    Pre Biometrics          Height  5' 5.5" (1.664 m)    Weight  61.5 kg    Waist Circumference  28 inches    Hip Circumference  30.5 inches    Waist to Hip Ratio  0.92 %    BMI (Calculated)  22.21    Triceps Skinfold  6 mm    % Body Fat  15.4 %    Grip Strength  48 kg    Flexibility  13 in    Single Leg Stand  30 seconds          Post Biometrics - 09/01/18 0711     Post  Biometrics          Height  5' 5.5" (1.664 m)    Weight  60.1 kg    Waist Circumference  27 inches    Hip Circumference  31 inches    Waist to Hip Ratio  0.87 %    BMI (Calculated)  21.71    Triceps Skinfold  5 mm    % Body Fat  14 %    Grip Strength  41 kg    Flexibility  13.5 in    Single  Leg Stand  35 seconds           Nutrition: Nutrition Therapy & Goals - 09/01/18 0755    Nutrition Therapy          Diet  heart healthy        Personal Nutrition Goals          Nutrition Goal  pt to identify and limit saturated fat, trans fat, refined carbohydrates, and sodium        Intervention Plan          Intervention  Prescribe, educate and counsel regarding individualized specific dietary modifications aiming towards targeted core components such as weight, hypertension, lipid management, diabetes, heart failure and other comorbidities.    Expected Outcomes  Short Term Goal: Understand basic principles of dietary content, such as calories, fat, sodium, cholesterol and nutrients.           Nutrition Discharge: Nutrition Assessments - 09/01/18 0754    MEDFICTS Scores          Pre Score  49    Post Score  12    Score Difference  -37           Education Questionnaire Score: Knowledge Questionnaire Score - 09/01/18 0723    Knowledge Questionnaire Score          Pre Score  21/24    Post Score  22/24  Goals reviewed with patient; copy given to patient.

## 2018-09-09 ENCOUNTER — Encounter (HOSPITAL_COMMUNITY): Payer: Medicare Other

## 2018-09-12 ENCOUNTER — Encounter (HOSPITAL_COMMUNITY): Payer: Medicare Other

## 2018-09-14 ENCOUNTER — Encounter (HOSPITAL_COMMUNITY): Payer: Medicare Other

## 2018-09-15 ENCOUNTER — Ambulatory Visit (HOSPITAL_COMMUNITY)
Admission: EM | Admit: 2018-09-15 | Discharge: 2018-09-15 | Disposition: A | Discharge status: Home / Self Care | Payer: Medicare Other | Attending: Family Medicine | Admitting: Family Medicine

## 2018-09-15 ENCOUNTER — Encounter (HOSPITAL_COMMUNITY): Payer: Self-pay

## 2018-09-15 DIAGNOSIS — J069 Acute upper respiratory infection, unspecified: Secondary | ICD-10-CM | POA: Diagnosis not present

## 2018-09-15 DIAGNOSIS — T300 Burn of unspecified body region, unspecified degree: Secondary | ICD-10-CM

## 2018-09-15 MED ORDER — SILVER SULFADIAZINE 1 % EX CREA
TOPICAL_CREAM | Freq: Once | CUTANEOUS | Status: AC
Start: 2018-09-15 — End: 2018-09-15
  Administered 2018-09-15: 1 via TOPICAL

## 2018-09-15 MED ORDER — SILVER SULFADIAZINE 1 % EX CREA
TOPICAL_CREAM | CUTANEOUS | Status: AC
  Filled 2018-09-15: qty 85

## 2018-09-15 MED ORDER — AZITHROMYCIN 250 MG PO TABS: ORAL_TABLET | ORAL | 0 refills | Status: AC

## 2018-09-15 MED ORDER — SILVER SULFADIAZINE 1 % EX CREA: 1.0000 "application " | TOPICAL_CREAM | Freq: Every day | CUTANEOUS | 0 refills | Status: DC

## 2018-09-15 NOTE — ED Provider Notes (Signed)
[facility]    CSN: [id number] Arrival date & time: 09/15/18  0857     History   Chief Complaint Chief Complaint  Patient presents with  . Burn  . Cough    HPI Rodney Walker is a 37 y.o. male.   Rodney Walker presents with complaints of URI symptoms which started three days ago. Congestion, productive cough. Sore throat has improved. No ear pain. No gi/gu complaints. Occasional shortness of breath. No known fevers. History of asthma, not using any inhalers. Smokes marijuana daily. Also complaints of burn to right knee. Yesterday boiled water to create steam to help with congestion, accidentally spilled it onto his leg. Blisters present. No drainage. No pain today. Applied antibiotic ointment today. Hx of HIV, on biktarvy, cd4 last of 24. Hx of pna, COPD, MI, sepsis related to pna.     ROS per HPI.      Past Medical History:  Diagnosis Date  . COPD (chronic obstructive pulmonary disease) (HCC)   . Coronary artery disease    MI 2010 - dissection/plaque rupture in LAD tx with antiplt/antithrombotics >> relook cath with resolution (no PCI performed) // Inf STEMI 3/19: OM1 30; pRCA 100 >> DES // Echo 3/19:  mild LVH, EF 40-45, inf/inf-sept HK, trivial MR  . Eczema   . History of myocardial infarction 2010; 2019  . History of pleural empyema   . HIV infection (HCC)   . Hyperlipidemia   . Immune deficiency disorder (HCC)   . Ischemic cardiomyopathy 04/05/2018  . Moderate persistent asthma without complication 10/29/2016    Patient Active Problem List   Diagnosis Date Noted  . Hyperlipidemia 04/05/2018  . Ischemic cardiomyopathy 04/05/2018  . History of acute inferior wall MI 03/24/2018  . Marijuana abuse, continuous 10/29/2016  . Moderate persistent asthma without complication 10/29/2016  . Allergic rhinitis 10/08/2016  . Anemia 05/03/2015  . Lung abscess (HCC) 04/22/2015  . Sepsis due to pneumonia (HCC) 04/17/2015  . Bullous emphysema (HCC)   . Lobar  pneumonia (HCC)   . Empyema lung (HCC) 04/16/2015  . HIV (human immunodeficiency virus infection) (HCC)   . Tobacco abuse 03/21/2015  . Lung mass 08/31/2014  . 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 Myalgia 04/23/2014  . AIDS (HCC) 03/22/2014  . CAP (community acquired pneumonia) 03/22/2014  . CAD (coronary artery disease) 11/13/2011    Past Surgical History:  Procedure Laterality Date  . CARDIAC CATHETERIZATION  06/24/2009   EF 60%  . CARDIAC CATHETERIZATION  06/17/2009   EF 45%. ANTERIOR HYPOKINESIS  . CORONARY STENT INTERVENTION N/A 03/24/2018   Procedure: CORONARY STENT INTERVENTION;  Surgeon: Tonny Bollman, MD;  Location: [facility] INVASIVE CV LAB;  Service: Cardiovascular;  Laterality: N/A;  . CORONARY/GRAFT ACUTE MI REVASCULARIZATION N/A 03/24/2018   Procedure: Coronary/Graft Acute MI Revascularization;  Surgeon: Tonny Bollman, MD;  Location: [facility] INVASIVE CV LAB;  Service: Cardiovascular;  Laterality: N/A;  . LEFT HEART CATH AND CORONARY ANGIOGRAPHY N/A 03/24/2018   Procedure: LEFT HEART CATH AND CORONARY ANGIOGRAPHY;  Surgeon: Tonny Bollman, MD;  Location: [facility] INVASIVE CV LAB;  Service: Cardiovascular;  Laterality: N/A;  . US ECHOCARDIOGRAPHY  12/09/2009   EF 55-60%  . VIDEO ASSISTED THORACOSCOPY (VATS)/ LOBECTOMY Right 04/23/2015   Procedure: VIDEO ASSISTED THORACOSCOPY (VATS)/RIGHT upper  and right middle LOBECTOMY;  Surgeon: Kerin Perna, MD;  Location: [facility] OR;  Service: Thoracic;  Laterality: Right;  Marland Kitchen VIDEO BRONCHOSCOPY Bilateral 09/05/2014   Procedure: VIDEO BRONCHOSCOPY WITH FLUORO;  Surgeon: Merwyn Katos, MD;  Location:  [facility];  Service: Cardiopulmonary;  Laterality: Bilateral;  . VIDEO BRONCHOSCOPY Bilateral 04/10/2015   Procedure: VIDEO BRONCHOSCOPY WITH FLUORO;  Surgeon: Lupita Leash, MD;  Location: [facility] ENDOSCOPY;  Service: Cardiopulmonary;  Laterality: Bilateral;       Home Medications    Prior to Admission medications   Medication Sig Start Date End  Date Taking? Authorizing Provider  albuterol (PROVENTIL HFA;VENTOLIN HFA) 108 (90 Base) MCG/ACT inhaler Inhale 4 puffs into the lungs every 4 (four) hours as needed for wheezing or shortness of breath. 10/29/16   Alfonse Spruce, MD  aspirin 81 MG EC tablet Take 1 tablet (81 mg total) by mouth daily. 03/26/18   Duke, Roe Rutherford, PA  atorvastatin (LIPITOR) 40 MG tablet Take 1 tablet (40 mg total) by mouth daily at 6 PM. 03/26/18   Duke, Roe Rutherford, PA  azithromycin (ZITHROMAX) 250 MG tablet Take 2 tablets (500 mg total) by mouth daily for 1 day, THEN 1 tablet (250 mg total) daily for 4 days. 09/15/18 09/20/18  Georgetta Haber, NP  BIKTARVY [phone] MG TABS tablet TAKE 1 TABLET BY MOUTH DAILY 06/14/18   Judyann Munson, MD  cetirizine (ZYRTEC) 10 MG tablet TAKE 1 TABLET(10 MG) BY MOUTH DAILY 05/26/18   Judyann Munson, MD  darunavir-cobicistat (PREZCOBIX) 800-150 MG tablet Take 1 tablet daily with breakfast by mouth. Swallow whole. Do NOT crush, break or chew tablets. Take with food. 11/03/17   Judyann Munson, MD  diclofenac sodium (VOLTAREN) 1 % GEL Apply 2 g topically 4 (four) times daily. 04/23/18   Mathews Robinsons B, PA-C  lisinopril (PRINIVIL,ZESTRIL) 2.5 MG tablet Take 1 tablet (2.5 mg total) by mouth daily. 05/04/18 08/02/18  Tereso Newcomer T, PA-C  methocarbamol (ROBAXIN) 500 MG tablet Take 1 tablet (500 mg total) by mouth at bedtime as needed for muscle spasms. 04/23/18   Mathews Robinsons B, PA-C  metoprolol tartrate (LOPRESSOR) 25 MG tablet Take 0.5 tablets (12.5 mg total) by mouth 2 (two) times daily. 03/26/18   [facility], Roe Rutherford, PA  nitroGLYCERIN (NITROSTAT) 0.4 MG SL tablet Place 1 tablet (0.4 mg total) under the tongue every 5 (five) minutes x 3 doses as needed for chest pain. 03/26/18   Duke, Roe Rutherford, PA  silver sulfADIAZINE (SILVADENE) 1 % cream Apply 1 application topically daily. 09/15/18   Georgetta Haber, NP    Family History Family History  Problem Relation Age of Onset   . Diabetes Mother   . Eczema Mother   . Asthma Mother   . COPD Mother   . Hypertension Mother   . Hyperlipidemia Mother   . Stroke Mother   . Heart disease Mother   . Sleep apnea Mother   . Liver cancer Father   . Asthma Brother     Social History Social History   Tobacco Use  . Smoking status: Former Smoker    Packs/day: 0.25    Years: 17.00    Pack years: 4.25    Types: Cigarettes    Last attempt to quit: 03/23/2015    Years since quitting: 3.4  . Smokeless tobacco: Never Used  Substance Use Topics  . Alcohol use: No    Alcohol/week: 0.0 standard drinks  . Drug use: Yes    Frequency: 2.0 times per week    Types: Marijuana     Allergies   Patient has no known allergies.   Review of Systems Review of Systems   Physical Exam Triage Vital Signs ED Triage Vitals [09/15/18 0909]  Enc Vitals Group     BP      Pulse      Resp      Temp      Temp src      SpO2      Weight 135 lb (61.2 kg)     Height      Head Circumference      Peak Flow      Pain Score      Pain Loc      Pain Edu?      Excl. in GC?    No data found.  Updated Vital Signs BP 108/63 (BP Location: Right Arm)   Pulse 78   Temp 97.9 F (36.6 C) (Oral)   Resp 18   Wt 135 lb (61.2 kg)   SpO2 98%   BMI 22.12 kg/m    Physical Exam  Constitutional: He is oriented to person, place, and time. He appears well-developed and well-nourished.  HENT:  Head: Normocephalic and atraumatic.  Right Ear: Tympanic membrane, external ear and ear canal normal.  Left Ear: Tympanic membrane, external ear and ear canal normal.  Nose: Rhinorrhea present. Right sinus exhibits no maxillary sinus tenderness and no frontal sinus tenderness. Left sinus exhibits no maxillary sinus tenderness and no frontal sinus tenderness.  Mouth/Throat: Uvula is midline, oropharynx is clear and moist and mucous membranes are normal.  Eyes: Pupils are equal, round, and reactive to light. Conjunctivae are normal.  Neck:  Normal range of motion.  Cardiovascular: Normal rate and regular rhythm.  Pulmonary/Chest: Effort normal. No respiratory distress. He has decreased breath sounds.  Occasional congested cough noted   Lymphadenopathy:    He has no cervical adenopathy.  Neurological: He is alert and oriented to person, place, and time.  Skin: Skin is warm and dry. Burn noted.     Approximately 6 cm in diameter area of affected skin with intact blisters; no swelling of skin or surrounding redness   Vitals reviewed.    UC Treatments / Results  Labs (all labs ordered are listed, but only abnormal results are displayed) Labs Reviewed - No data to display  EKG None  Radiology No results found.  Procedures Procedures (including critical care time)  Medications Ordered in UC Medications  silver sulfADIAZINE (SILVADENE) 1 % cream (has no administration in time range)    Initial Impression / Assessment and Plan / UC Course  I have reviewed the triage vital signs and the nursing notes.  Pertinent labs & imaging results that were available during my care of the patient were reviewed by me and considered in my medical decision making (see chart for details).     High risk medical history with productive cough and Uri symptoms. Opted to provide coverage with azithromycin at this time. Wound care provided and discussed for right knee burn. Return precautions provided. Patient verbalized understanding and agreeable to plan.    Final Clinical Impressions(s) / UC Diagnoses   Final diagnoses:  Upper respiratory tract infection, unspecified type  Burn     Discharge Instructions     Push fluids to ensure adequate hydration and keep secretions thin.  Tylenol and/or ibuprofen as needed for pain or fevers.  May continue with dayquil as needed for symptoms.  Complete course of antibiotics.  Use of silvadene to wound daily. Keep covered to keep protected during the day. Wash daily gently with soap and  water. Try to keep blisters intake to prevent infection.  If develop worsening of pain,  fevers, redness, thick drainage, shortness of breath , fevers, difficulty breathing or otherwise worsening please return to be seen or go to Er if severe.     ED Prescriptions    Medication Sig Dispense Auth. Provider   silver sulfADIAZINE (SILVADENE) 1 % cream Apply 1 application topically daily. 50 g Linus Mako B, NP   azithromycin (ZITHROMAX) 250 MG tablet Take 2 tablets (500 mg total) by mouth daily for 1 day, THEN 1 tablet (250 mg total) daily for 4 days. 6 tablet Georgetta Haber, NP     Controlled Substance Prescriptions [place] Controlled Substance Registry consulted? Not Applicable   Georgetta Haber, NP 09/15/18 [id number]

## 2018-09-15 NOTE — Discharge Instructions (Signed)
Push fluids to ensure adequate hydration and keep secretions thin.  Tylenol and/or ibuprofen as needed for pain or fevers.  May continue with dayquil as needed for symptoms.  Complete course of antibiotics.  Use of silvadene to wound daily. Keep covered to keep protected during the day. Wash daily gently with soap and water. Try to keep blisters intake to prevent infection.  If develop worsening of pain, fevers, redness, thick drainage, shortness of breath , fevers, difficulty breathing or otherwise worsening please return to be seen or go to Er if severe.

## 2018-09-15 NOTE — ED Notes (Signed)
Wound care given to right knee

## 2018-09-15 NOTE — ED Triage Notes (Signed)
Pt states he boiled some water and went to set down with a hot bowl of water and it ended up turning over on his right leg. This happened yesterday morning. Pt state he has a cold, cough and congestion. X 3days

## 2018-09-26 ENCOUNTER — Other Ambulatory Visit: Payer: Self-pay | Admitting: Internal Medicine

## 2018-09-27 ENCOUNTER — Other Ambulatory Visit: Payer: Self-pay | Admitting: *Deleted

## 2018-09-27 DIAGNOSIS — B20 Human immunodeficiency virus [HIV] disease: Secondary | ICD-10-CM

## 2018-09-27 MED ORDER — BICTEGRAVIR-EMTRICITAB-TENOFOV 50-200-25 MG PO TABS: 1.0000 | ORAL_TABLET | Freq: Every day | ORAL | 5 refills | Status: DC

## 2018-11-07 ENCOUNTER — Ambulatory Visit (INDEPENDENT_AMBULATORY_CARE_PROVIDER_SITE_OTHER): Payer: Medicare Other | Admitting: Internal Medicine

## 2018-11-07 ENCOUNTER — Encounter: Payer: Self-pay | Admitting: Internal Medicine

## 2018-11-07 VITALS — BP 108/65 | HR 60 | Temp 98.2°F | Ht 64.0 in | Wt 133.0 lb

## 2018-11-07 DIAGNOSIS — Z23 Encounter for immunization: Secondary | ICD-10-CM | POA: Diagnosis not present

## 2018-11-07 DIAGNOSIS — I255 Ischemic cardiomyopathy: Secondary | ICD-10-CM

## 2018-11-07 DIAGNOSIS — M25551 Pain in right hip: Secondary | ICD-10-CM | POA: Diagnosis not present

## 2018-11-07 DIAGNOSIS — B20 Human immunodeficiency virus [HIV] disease: Secondary | ICD-10-CM | POA: Diagnosis not present

## 2018-11-07 DIAGNOSIS — M8700 Idiopathic aseptic necrosis of unspecified bone: Secondary | ICD-10-CM

## 2018-11-07 MED ORDER — OXYCODONE HCL 5 MG PO TABS: 5.0000 mg | ORAL_TABLET | Freq: Two times a day (BID) | ORAL | 0 refills | Status: DC | PRN

## 2018-11-07 NOTE — Progress Notes (Signed)
Currently c/o hip pain. Feels like the Tylenol ES is no longer working.   Administered menveo #2 and Flu vaccines today. Patient tolerated well.

## 2018-11-07 NOTE — Progress Notes (Signed)
RFV: follow up for hiv disease  Patient ID: Rodney Walker, male   DOB: [birth year], 37 y.o.   MRN: [id number]  HPI [name] is a 37yo M with hiv disease, CD 4 count of 640/VL 97 on biktarvy, Hx of pulm abscess/MAC s/p lung resection, CAD, AVN of hip. Had been going to cardiac rehab up until end of august, and discharged to home. He is mostly limited by right hip pain. He limits his activity due to right hip pain. Has not been back to see dr Erlinda Hong yet. He is thinking about doing hip replacement.  Outpatient Encounter Medications as of 11/07/2018  Medication Sig  . albuterol (PROVENTIL HFA;VENTOLIN HFA) 108 (90 Base) MCG/ACT inhaler Inhale 4 puffs into the lungs every 4 (four) hours as needed for wheezing or shortness of breath.  Marland Kitchen aspirin 81 MG EC tablet Take 1 tablet (81 mg total) by mouth daily.  Marland Kitchen atorvastatin (LIPITOR) 40 MG tablet Take 1 tablet (40 mg total) by mouth daily at 6 PM.  . bictegravir-emtricitabine-tenofovir AF (BIKTARVY) 50-200-25 MG TABS tablet Take 1 tablet by mouth daily.  . cetirizine (ZYRTEC) 10 MG tablet TAKE 1 TABLET(10 MG) BY MOUTH DAILY  . diclofenac sodium (VOLTAREN) 1 % GEL Apply 2 g topically 4 (four) times daily.  Marland Kitchen EFFIENT 10 MG TABS tablet Take 1 tablet by mouth daily.  . metoprolol tartrate (LOPRESSOR) 25 MG tablet Take 0.5 tablets (12.5 mg total) by mouth 2 (two) times daily.  . nitroGLYCERIN (NITROSTAT) 0.4 MG SL tablet Place 1 tablet (0.4 mg total) under the tongue every 5 (five) minutes x 3 doses as needed for chest pain.  Marland Kitchen PREZCOBIX 800-150 MG tablet TAKE 1 TABLET BY MOUTH DAILY WITH BREAKFAST. SWALLOW WHOLE. DO NOT CRUSH, BREAK OR CHEW TABLETS.TAKE WITH FOOD  . silver sulfADIAZINE (SILVADENE) 1 % cream Apply 1 application topically daily.  Marland Kitchen lisinopril (PRINIVIL,ZESTRIL) 2.5 MG tablet Take 1 tablet (2.5 mg total) by mouth daily.  . methocarbamol (ROBAXIN) 500 MG tablet Take 1 tablet (500 mg total) by mouth at bedtime as needed for muscle  spasms. (Patient not taking: Reported on 11/07/2018)   No facility-administered encounter medications on file as of 11/07/2018.      Patient Active Problem List   Diagnosis Date Noted  . Hyperlipidemia 04/05/2018  . Ischemic cardiomyopathy 04/05/2018  . History of acute inferior wall MI 03/24/2018  . Marijuana abuse, continuous 10/29/2016  . Moderate persistent asthma without complication 84/66/5993  . Allergic rhinitis 10/08/2016  . Anemia 05/03/2015  . Lung abscess ([place]) 04/22/2015  . Sepsis due to pneumonia ([place]) 04/17/2015  . Bullous emphysema ([place])   . Lobar pneumonia ([place])   . Empyema lung ([place]) 04/16/2015  . HIV (human immunodeficiency virus infection) ([place])   . Tobacco abuse 03/21/2015  . Lung mass 08/31/2014  . Eosinophilic folliculitis 57/12/7791  . Myalgia 04/23/2014  . AIDS ([place]) 03/22/2014  . CAP (community acquired pneumonia) 03/22/2014  . CAD (coronary artery disease) 11/13/2011     Health Maintenance Due  Topic Date Due  . INFLUENZA VACCINE  07/28/2018     Review of Systems Review of Systems  Constitutional: Negative for fever, chills, diaphoresis, activity change, appetite change, fatigue and unexpected weight change.  HENT: Negative for congestion, sore throat, rhinorrhea, sneezing, trouble swallowing and sinus pressure.  Eyes: Negative for photophobia and visual disturbance.  Respiratory: Negative for cough, chest tightness, shortness of breath, wheezing and stridor.  Cardiovascular: Negative for chest pain, palpitations and leg swelling.  Gastrointestinal:  Negative for nausea, vomiting, abdominal pain, diarrhea, constipation, blood in stool, abdominal distention and anal bleeding.  Genitourinary: Negative for dysuria, hematuria, flank pain and difficulty urinating.  Musculoskeletal: +right hip arthralgias and gait problem.  Skin: Negative for color change, pallor, rash and wound.  Neurological: Negative for dizziness, tremors, weakness and  light-headedness.  Hematological: Negative for adenopathy. Does not bruise/bleed easily.  Psychiatric/Behavioral: Negative for behavioral problems, confusion, sleep disturbance, dysphoric mood, decreased concentration and agitation.    Physical Exam   BP 108/65   Pulse 60   Temp 98.2 F (36.8 C)   Ht _0  (1.626 m)   Wt 133 lb (60.3 kg)   BMI 22.83 kg/m   Physical Exam  Constitutional: He is oriented to person, place, and time. He appears well-developed and well-nourished. No distress.  HENT:  Mouth/Throat: Oropharynx is clear and moist. No oropharyngeal exudate.  Cardiovascular: Normal rate, regular rhythm and normal heart sounds. Exam reveals no gallop and no friction rub.  No murmur heard.  Pulmonary/Chest: Effort normal and breath sounds normal. No respiratory distress. He has no wheezes.  Abdominal: Soft. Bowel sounds are normal. He exhibits no distension. There is no tenderness.  Lymphadenopathy:  He has no cervical adenopathy.  Neurological: He is alert and oriented to person, place, and time.  Skin: Skin is warm and dry. No rash noted. No erythema.  Psychiatric: He has a normal mood and affect. His behavior is normal.    Lab Results  Component Value Date   CD4TCELL 23 (L) 08/08/2018   Lab Results  Component Value Date   CD4TABS 640 08/08/2018   CD4TABS 660 05/05/2018   CD4TABS 620 05/31/2017   Lab Results  Component Value Date   HIV1RNAQUANT 97 (H) 08/08/2018   No results found for: HEPBSAB Lab Results  Component Value Date   LABRPR NON-REACTIVE 05/05/2018    CBC Lab Results  Component Value Date   WBC 7.0 08/08/2018   RBC 3.99 (L) 08/08/2018   HGB 13.1 (L) 08/08/2018   HCT 37.2 (L) 08/08/2018   PLT 225 08/08/2018   MCV 93.2 08/08/2018   MCH 32.8 08/08/2018   MCHC 35.2 08/08/2018   RDW 14.3 08/08/2018   LYMPHSABS 2,695 08/08/2018   MONOABS 1.2 (H) 03/24/2018   EOSABS 133 08/08/2018    BMET Lab Results  Component Value Date   NA 139  08/08/2018   K 4.6 08/08/2018   CL 107 08/08/2018   CO2 29 08/08/2018   GLUCOSE 99 08/08/2018   BUN 14 08/08/2018   CREATININE 1.20 08/08/2018   CALCIUM 9.1 08/08/2018   GFRNONAA 77 08/08/2018   GFRAA 89 08/08/2018      Assessment and Plan   Health maintenance = will do flu shot plus meningococcal #2  hiv disease = has low level viremia, unclear if it is due to missing doses.will get labs, plan on continuing with biktarvy  AVN of right hip = taking tylenol 650 TID still having discomfort. Planning to go back to Dr Erlinda Hong for surgery. Will give oxycodone 33m #30 for severe pain  rtc in 337month

## 2018-11-08 LAB — T-HELPER CELL (CD4) - (RCID CLINIC ONLY)
CD4 % Helper T Cell: 24 % — ABNORMAL LOW (ref 33–55)
CD4 T CELL ABS: 700 /uL (ref 400–2700)

## 2018-11-09 LAB — COMPLETE METABOLIC PANEL WITH GFR
AG Ratio: 1.4 (calc) (ref 1.0–2.5)
ALKALINE PHOSPHATASE (APISO): 80 U/L (ref 40–115)
ALT: 8 U/L — ABNORMAL LOW (ref 9–46)
AST: 14 U/L (ref 10–40)
Albumin: 4.1 g/dL (ref 3.6–5.1)
BUN: 11 mg/dL (ref 7–25)
CO2: 28 mmol/L (ref 20–32)
Calcium: 9.2 mg/dL (ref 8.6–10.3)
Chloride: 104 mmol/L (ref 98–110)
Creat: 0.99 mg/dL (ref 0.60–1.35)
GFR, Est African American: 112 mL/min/{1.73_m2} (ref >=60)
GFR, Est Non African American: 96 mL/min/{1.73_m2} (ref >=60)
GLOBULIN: 2.9 g/dL (ref 1.9–3.7)
Glucose, Bld: 110 mg/dL — ABNORMAL HIGH (ref 65–99)
Potassium: 3.5 mmol/L (ref 3.5–5.3)
SODIUM: 139 mmol/L (ref 135–146)
Total Bilirubin: 0.4 mg/dL (ref 0.2–1.2)
Total Protein: 7 g/dL (ref 6.1–8.1)

## 2018-11-09 LAB — RPR: RPR Ser Ql: NONREACTIVE

## 2018-11-09 LAB — CBC WITH DIFFERENTIAL/PLATELET
BASOS PCT: 0.1 %
Basophils Absolute: 8 cells/uL (ref 0–200)
EOS ABS: 40 {cells}/uL (ref 15–500)
Eosinophils Relative: 0.5 %
HEMATOCRIT: 38.4 % — AB (ref 38.5–50.0)
HEMOGLOBIN: 13.5 g/dL (ref 13.2–17.1)
LYMPHS ABS: 2824 {cells}/uL (ref 850–3900)
MCH: 32.3 pg (ref 27.0–33.0)
MCHC: 35.2 g/dL (ref 32.0–36.0)
MCV: 91.9 fL (ref 80.0–100.0)
MONOS PCT: 7.2 %
MPV: 10.1 fL (ref 7.5–12.5)
NEUTROS ABS: 4552 {cells}/uL (ref 1500–7800)
Neutrophils Relative %: 56.9 %
Platelets: 248 10*3/uL (ref 140–400)
RBC: 4.18 10*6/uL — ABNORMAL LOW (ref 4.20–5.80)
RDW: 13.8 % (ref 11.0–15.0)
Total Lymphocyte: 35.3 %
WBC mixed population: 576 cells/uL (ref 200–950)
WBC: 8 10*3/uL (ref 3.8–10.8)

## 2018-11-09 LAB — HIV-1 RNA QUANT-NO REFLEX-BLD
HIV 1 RNA QUANT: 290 {copies}/mL — AB
HIV-1 RNA QUANT, LOG: 2.46 {Log_copies}/mL — AB

## 2018-11-11 ENCOUNTER — Encounter: Payer: Self-pay | Admitting: Cardiovascular Disease

## 2018-11-11 ENCOUNTER — Ambulatory Visit (INDEPENDENT_AMBULATORY_CARE_PROVIDER_SITE_OTHER): Payer: Medicare Other | Admitting: Cardiovascular Disease

## 2018-11-11 VITALS — BP 104/62 | HR 65 | Ht 64.0 in | Wt 135.0 lb

## 2018-11-11 DIAGNOSIS — I2510 Atherosclerotic heart disease of native coronary artery without angina pectoris: Secondary | ICD-10-CM | POA: Diagnosis not present

## 2018-11-11 DIAGNOSIS — I255 Ischemic cardiomyopathy: Secondary | ICD-10-CM | POA: Diagnosis not present

## 2018-11-11 LAB — BASIC METABOLIC PANEL
BUN/Creatinine Ratio: 12 (ref 9–20)
BUN: 13 mg/dL (ref 6–20)
CO2: 23 mmol/L (ref 20–29)
CREATININE: 1.11 mg/dL (ref 0.76–1.27)
Calcium: 9.2 mg/dL (ref 8.7–10.2)
Chloride: 105 mmol/L (ref 96–106)
GFR calc Af Amer: 97 mL/min/{1.73_m2} (ref >59)
GFR calc non Af Amer: 84 mL/min/{1.73_m2} (ref >59)
Glucose: 80 mg/dL (ref 65–99)
Potassium: 3.8 mmol/L (ref 3.5–5.2)
Sodium: 140 mmol/L (ref 134–144)

## 2018-11-11 LAB — LIPID PANEL
Chol/HDL Ratio: 2.7 ratio (ref 0.0–5.0)
Cholesterol, Total: 134 mg/dL (ref 100–199)
HDL: 50 mg/dL (ref >39)
LDL Calculated: 71 mg/dL (ref 0–99)
Triglycerides: 65 mg/dL (ref 0–149)
VLDL Cholesterol Cal: 13 mg/dL (ref 5–40)

## 2018-11-11 LAB — HEPATIC FUNCTION PANEL
ALBUMIN: 4.3 g/dL (ref 3.5–5.5)
ALK PHOS: 87 IU/L (ref 39–117)
ALT: 9 IU/L (ref 0–44)
AST: 16 IU/L (ref 0–40)
BILIRUBIN TOTAL: 0.3 mg/dL (ref 0.0–1.2)
BILIRUBIN, DIRECT: 0.08 mg/dL (ref 0.00–0.40)
TOTAL PROTEIN: 7.3 g/dL (ref 6.0–8.5)

## 2018-11-11 MED ORDER — METOPROLOL SUCCINATE ER 25 MG PO TB24: 25.0000 mg | ORAL_TABLET | Freq: Every day | ORAL | 3 refills | Status: DC

## 2018-11-11 NOTE — Progress Notes (Signed)
Rodney Walker Date of Birth  [birth year] [facility] [address]    [address] [place], [place]  [address] [phone]  Fax  [phone]    Rodney Walker is a 37 y.o.  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  He was on Plavix for a year.  He is now on Aspirin but admits to not taking it regularly.    He remains active,  He works at [employer].  He does not get any regular aerobic exercise.  November 11, 2018:  Rodney Walker is seen back today for a follow-up visit.  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  Hx of HIV, ashtma   Is admitted to the hospital in March, 2019 with an inferior wall ST segment elevation myocardial infarction.  He had atherectomy and then followed by drug-eluting stent to the proximal RCA.  He is done well.  He is not had any episodes of chest pain or shortness of breath.  He needs to have hip surgery  He is at low risk for this hip surgery  He may hold Effient for 7 days prior to the procedure. I would prefer that he continue the ASA during this time   Current Outpatient Medications on File Prior to Visit  Medication Sig Dispense Refill  . aspirin 81 MG EC tablet Take 1 tablet (81 mg total) by mouth daily. 90 tablet 3  . atorvastatin (LIPITOR) 40 MG tablet Take 1 tablet (40 mg total) by mouth daily at 6 PM. 90 tablet 3  . bictegravir-emtricitabine-tenofovir AF (BIKTARVY) 50-200-25 MG TABS tablet Take 1 tablet by mouth daily. 30 tablet 5  . cetirizine (ZYRTEC) 10 MG tablet TAKE 1 TABLET(10 MG) BY MOUTH DAILY 30 tablet 5  . EFFIENT 10 MG TABS tablet Take 1 tablet by mouth daily.  2  . lisinopril (PRINIVIL,ZESTRIL) 2.5 MG tablet Take 1 tablet (2.5 mg total) by mouth daily. 90 tablet 3  . metoprolol tartrate (LOPRESSOR) 25 MG tablet Take 0.5 tablets (12.5 mg total) by mouth 2 (two) times daily. 60 tablet 6  . nitroGLYCERIN (NITROSTAT) 0.4 MG SL tablet Place 1  tablet (0.4 mg total) under the tongue every 5 (five) minutes x 3 doses as needed for chest pain. 25 tablet 2  . oxyCODONE (OXY IR/ROXICODONE) 5 MG immediate release tablet Take 1 tablet (5 mg total) by mouth every 12 (twelve) hours as needed for up to 30 doses for breakthrough pain. 30 tablet 0  . PREZCOBIX 800-150 MG tablet TAKE 1 TABLET BY MOUTH DAILY WITH BREAKFAST. SWALLOW WHOLE. DO NOT CRUSH, BREAK OR CHEW TABLETS.TAKE WITH FOOD 30 tablet 5   No current facility-administered medications on file prior to visit.     No Known Allergies  Past Medical History:  Diagnosis Date  . COPD (chronic obstructive pulmonary disease) (HCC)   . Coronary artery disease    MI 2010 - dissection/plaque rupture in LAD tx with antiplt/antithrombotics >> relook cath with resolution (no PCI performed) // Inf STEMI 3/19: OM1 30; pRCA 100 >> DES // Echo 3/19:  mild LVH, EF 40-45, inf/inf-sept HK, trivial MR  . Eczema   . History of myocardial infarction 2010; 2019  . History of pleural empyema   . HIV infection (HCC)   . Hyperlipidemia   . Immune deficiency disorder (HCC)   . Ischemic cardiomyopathy 04/05/2018  . Moderate persistent asthma without complication 10/29/2016    Past Surgical History:  Procedure Laterality Date  . CARDIAC CATHETERIZATION  06/24/2009   EF 60%  . CARDIAC CATHETERIZATION  06/17/2009   EF 45%. ANTERIOR HYPOKINESIS  . CORONARY STENT INTERVENTION N/A 03/24/2018   Procedure: CORONARY STENT INTERVENTION;  Surgeon: Tonny Bollman, MD;  Location[facility] INVASIVE CV LAB;  Service: Cardiovascular;  Laterality: N/A;  . CORONARY/GRAFT ACUTE MI REVASCULARIZATION N/A 03/24/2018   Procedure: Coronary/Graft Acute MI Revascularization;  Surgeon: Tonny Bollman, MD;  Location: [facility] INVASIVE CV LAB;  Service: Cardiovascular;  Laterality: N/A;  . LEFT HEART CATH AND CORONARY ANGIOGRAPHY N/A 03/24/2018   Procedure: LEFT HEART CATH AND CORONARY ANGIOGRAPHY;  Surgeon: Tonny Bollman, MD;  Location: [facility]  INVASIVE CV LAB;  Service: Cardiovascular;  Laterality: N/A;  . US ECHOCARDIOGRAPHY  12/09/2009   EF 55-60%  . VIDEO ASSISTED THORACOSCOPY (VATS)/ LOBECTOMY Right 04/23/2015   Procedure: VIDEO ASSISTED THORACOSCOPY (VATS)/RIGHT upper  and right middle LOBECTOMY;  Surgeon: Kerin Perna, MD;  Location: [facility] OR;  Service: Thoracic;  Laterality: Right;  Marland Kitchen VIDEO BRONCHOSCOPY Bilateral 09/05/2014   Procedure: VIDEO BRONCHOSCOPY WITH FLUORO;  Surgeon: Merwyn Katos, MD;  Location: [facility] ENDOSCOPY;  Service: Cardiopulmonary;  Laterality: Bilateral;  . VIDEO BRONCHOSCOPY Bilateral 04/10/2015   Procedure: VIDEO BRONCHOSCOPY WITH FLUORO;  Surgeon: Lupita Leash, MD;  Location: [facility] ENDOSCOPY;  Service: Cardiopulmonary;  Laterality: Bilateral;    Social History   Tobacco Use  Smoking Status Current Some Day Smoker  . Packs/day: 0.25  . Years: 17.00  . Pack years: 4.25  . Types: Cigarettes, Cigars  . Last attempt to quit: 03/23/2015  . Years since quitting: 3.6  Smokeless Tobacco Never Used    Social History   Substance and Sexual Activity  Alcohol Use No  . Alcohol/week: 0.0 standard drinks    Family History  Problem Relation Age of Onset  . Diabetes Mother   . Eczema Mother   . Asthma Mother   . COPD Mother   . Hypertension Mother   . Hyperlipidemia Mother   . Stroke Mother   . Heart disease Mother   . Sleep apnea Mother   . Liver cancer Father   . Asthma Brother     Reviw of Systems:  Reviewed in the HPI.  All other systems are negative.  Physical Exam: Blood pressure 104/62, pulse 65, height 5\' 4"  (1.626 m), weight 135 lb (61.2 kg), SpO2 99 %.  GEN:  Well nourished, well developed in no acute distress HEENT: Normal NECK: No JVD; No carotid bruits LYMPHATICS: No lymphadenopathy CARDIAC: RRR   RESPIRATORY:  Clear to auscultation without rales, wheezing or rhonchi  ABDOMEN: Soft, non-tender, non-distended MUSCULOSKELETAL:  No edema; No deformity  SKIN: Warm and  dry NEUROLOGIC:  Alert and oriented x 3   ECG:    Assessment / Plan:   1.  CAD:  On Effient and ASA for stent placed in March  Overall doing well   2. Right hip avascular necrosis He needs a hip replacement   He needs to have hip surgery  He is at low risk for this hip surgery  He may hold Effient for 7 days prior to the procedure. I would prefer that he continue the ASA during this time

## 2018-11-11 NOTE — Patient Instructions (Signed)
Medication Instructions:  Your physician has recommended you make the following change in your medication:   STOP Metoprolol Tartrate (Lopressor) START Metoprolol  Succinate (Toprol XL) 25 mg once daily  If you need a refill on your cardiac medications before your next appointment, please call your pharmacy.   Lab work: TODAY - cholesterol, liver panel, basic metabolic panel  If you have labs (blood work) drawn today and your tests are completely normal, you will receive your results only by: Marland Kitchen MyChart Message (if you have MyChart) OR . A paper copy in the mail If you have any lab test that is abnormal or we need to change your treatment, we will call you to review the results.   Testing/Procedures: None Ordered   Follow-Up: At [facility], you and your health needs are our priority.  As part of our continuing mission to provide you with exceptional heart care, we have created designated Provider Care Teams.  These Care Teams include your primary Cardiologist (physician) and Advanced Practice Providers (APPs -  Physician Assistants and Nurse Practitioners) who all work together to provide you with the care you need, when you need it. You will need a follow up appointment in:  6 months.  Please call our office 2 months in advance to schedule this appointment.  You may see Kristeen Miss, MD or one of the following Advanced Practice Providers on your designated Care Team: Tereso Newcomer, PA-C [facility], [place] . Berton Bon, NP

## 2018-12-15 ENCOUNTER — Other Ambulatory Visit: Payer: Self-pay | Admitting: Internal Medicine

## 2019-02-01 ENCOUNTER — Ambulatory Visit (INDEPENDENT_AMBULATORY_CARE_PROVIDER_SITE_OTHER): Payer: Medicare Other | Admitting: Internal Medicine

## 2019-02-01 ENCOUNTER — Encounter: Payer: Self-pay | Admitting: Internal Medicine

## 2019-02-01 VITALS — Temp 98.4°F | Ht 64.0 in | Wt 134.0 lb

## 2019-02-01 DIAGNOSIS — Z79899 Other long term (current) drug therapy: Secondary | ICD-10-CM | POA: Diagnosis not present

## 2019-02-01 DIAGNOSIS — I2510 Atherosclerotic heart disease of native coronary artery without angina pectoris: Secondary | ICD-10-CM | POA: Diagnosis not present

## 2019-02-01 DIAGNOSIS — Z7982 Long term (current) use of aspirin: Secondary | ICD-10-CM

## 2019-02-01 DIAGNOSIS — M879 Osteonecrosis, unspecified: Secondary | ICD-10-CM

## 2019-02-01 DIAGNOSIS — Z87891 Personal history of nicotine dependence: Secondary | ICD-10-CM | POA: Diagnosis not present

## 2019-02-01 DIAGNOSIS — J984 Other disorders of lung: Secondary | ICD-10-CM | POA: Diagnosis not present

## 2019-02-01 DIAGNOSIS — G4700 Insomnia, unspecified: Secondary | ICD-10-CM

## 2019-02-01 DIAGNOSIS — B20 Human immunodeficiency virus [HIV] disease: Secondary | ICD-10-CM | POA: Diagnosis not present

## 2019-02-01 DIAGNOSIS — Z23 Encounter for immunization: Secondary | ICD-10-CM | POA: Diagnosis not present

## 2019-02-01 NOTE — Progress Notes (Signed)
RFV: follow up for hiv disease  Patient ID: Rodney Walker, male   DOB: Jun 22, 1981, 38 y.o.   MRN: [id number]  HPI [name] is a 38yo M with hiv disease, CD 4 count of 700/VL 290 (in Nov), on biktarvy-DRVc. 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  Not currently working since CAD. He finished out cardiac rehab 4 months ago  Has been taking hiv meds daily except 2 doses. But taking it irregularly (sometimes day time, am, late night)  ROS:  Insomnia +, otherwise 12 point ros is negative  Outpatient Encounter Medications as of 02/01/2019  Medication Sig  . aspirin 81 MG EC tablet Take 1 tablet (81 mg total) by mouth daily.  Marland Kitchen atorvastatin (LIPITOR) 40 MG tablet Take 1 tablet (40 mg total) by mouth daily at 6 PM.  . bictegravir-emtricitabine-tenofovir AF (BIKTARVY) 50-200-25 MG TABS tablet Take 1 tablet by mouth daily.  . cetirizine (ZYRTEC) 10 MG tablet TAKE 1 TABLET(10 MG) BY MOUTH DAILY  . EFFIENT 10 MG TABS tablet Take 1 tablet by mouth daily.  . metoprolol succinate (TOPROL-XL) 25 MG 24 hr tablet Take 1 tablet (25 mg total) by mouth daily.  Marland Kitchen oxyCODONE (OXY IR/ROXICODONE) 5 MG immediate release tablet Take 1 tablet (5 mg total) by mouth every 12 (twelve) hours as needed for up to 30 doses for breakthrough pain.  Marland Kitchen PREZCOBIX 800-150 MG tablet TAKE 1 TABLET BY MOUTH DAILY WITH BREAKFAST. SWALLOW WHOLE. DO NOT CRUSH, BREAK OR CHEW TABLETS.TAKE WITH FOOD  . lisinopril (PRINIVIL,ZESTRIL) 2.5 MG tablet Take 1 tablet (2.5 mg total) by mouth daily.  . nitroGLYCERIN (NITROSTAT) 0.4 MG SL tablet Place 1 tablet (0.4 mg total) under the tongue every 5 (five) minutes x 3 doses as needed for chest pain.   No facility-administered encounter medications on file as of 02/01/2019.      Patient Active Problem List   Diagnosis Date Noted  . Hyperlipidemia 04/05/2018  . Ischemic cardiomyopathy 04/05/2018  . History of acute inferior wall MI  03/24/2018  . Marijuana abuse, continuous 10/29/2016  . Moderate persistent asthma without complication 10/29/2016  . Allergic rhinitis 10/08/2016  . Anemia 05/03/2015  . Lung abscess (HCC) 04/22/2015  . Sepsis due to pneumonia (HCC) 04/17/2015  . Bullous emphysema (HCC)   . Lobar pneumonia (HCC)   . Empyema lung (HCC) 04/16/2015  . HIV (human immunodeficiency virus infection) (HCC)   . Tobacco abuse 03/21/2015  . Lung mass 08/31/2014  . 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 Myalgia 04/23/2014  . AIDS (HCC) 03/22/2014  . CAP (community acquired pneumonia) 03/22/2014  . CAD (coronary artery disease) 11/13/2011     There are no preventive care reminders to display for this patient.   Social History   Tobacco Use  . Smoking status: Former Smoker    Packs/day: 0.25    Years: 17.00    Pack years: 4.25    Types: Cigarettes, Cigars    Last attempt to quit: 03/23/2015    Years since quitting: 3.8  . Smokeless tobacco: Never Used  Substance Use Topics  . Alcohol use: No    Alcohol/week: 0.0 standard drinks  . Drug use: Yes    Frequency: 2.0 times per week    Types: Marijuana   Physical Exam   Temp 98.4 F (36.9 C) (Oral)   Ht 5\' 4"  (1.626 m)   Wt 134 lb (60.8 kg)   BMI 23.00 kg/m   Physical Exam  Constitutional: He is oriented to person, place, and time. He appears well-developed and well-nourished. No distress.  HENT:  Mouth/Throat: Oropharynx is clear and moist. No oropharyngeal exudate.  Cardiovascular: Normal rate, regular rhythm and normal heart sounds. Exam reveals no gallop and no friction rub.  No murmur heard.  Pulmonary/Chest: Effort normal and breath sounds normal. No respiratory distress. He has no wheezes.  Abdominal: Soft. Bowel sounds are normal. He exhibits no distension. There is no tenderness.  Lymphadenopathy:  He has no cervical adenopathy.  Ext: clubbin Neurological: He is alert and oriented to person, place, and time.  Skin: Skin is warm and  dry. No rash noted. No erythema.  Psychiatric: He has a normal mood and affect. His behavior is normal.    Lab Results  Component Value Date   CD4TCELL 24 (L) 11/07/2018   Lab Results  Component Value Date   CD4TABS 700 11/07/2018   CD4TABS 640 08/08/2018   CD4TABS 660 05/05/2018   Lab Results  Component Value Date   HIV1RNAQUANT 290 (H) 11/07/2018   No results found for: HEPBSAB Lab Results  Component Value Date   LABRPR NON-REACTIVE 11/07/2018    CBC Lab Results  Component Value Date   WBC 8.0 11/07/2018   RBC 4.18 (L) 11/07/2018   HGB 13.5 11/07/2018   HCT 38.4 (L) 11/07/2018   PLT 248 11/07/2018   MCV 91.9 11/07/2018   MCH 32.3 11/07/2018   MCHC 35.2 11/07/2018   RDW 13.8 11/07/2018   LYMPHSABS 2,824 11/07/2018   MONOABS 1.2 (H) 03/24/2018   EOSABS 40 11/07/2018    BMET Lab Results  Component Value Date   NA 140 11/11/2018   K 3.8 11/11/2018   CL 105 11/11/2018   CO2 23 11/11/2018   GLUCOSE 80 11/11/2018   BUN 13 11/11/2018   CREATININE 1.11 11/11/2018   CALCIUM 9.2 11/11/2018   GFRNONAA 84 11/11/2018   GFRAA 97 11/11/2018      Assessment and Plan  hiv disease = will check labs especially Will check viral load with genotype due ot viremia  AVN = right hip is okay. Still considering when to get hip replacement, but in the last 3 months it has been okay but it sounds like he doesn't do much activity  Health maintenance = due for hpv #3  insomnia = possibly need better sleep hygiene

## 2019-02-02 LAB — T-HELPER CELL (CD4) - (RCID CLINIC ONLY)
CD4 T CELL ABS: 680 /uL (ref 400–2700)
CD4 T CELL HELPER: 17 % — AB (ref 33–55)

## 2019-02-03 DIAGNOSIS — Z87891 Personal history of nicotine dependence: Secondary | ICD-10-CM | POA: Diagnosis not present

## 2019-02-03 DIAGNOSIS — G4700 Insomnia, unspecified: Secondary | ICD-10-CM | POA: Diagnosis not present

## 2019-02-03 DIAGNOSIS — I2510 Atherosclerotic heart disease of native coronary artery without angina pectoris: Secondary | ICD-10-CM | POA: Diagnosis not present

## 2019-02-03 DIAGNOSIS — B20 Human immunodeficiency virus [HIV] disease: Secondary | ICD-10-CM | POA: Diagnosis not present

## 2019-02-03 DIAGNOSIS — Z79899 Other long term (current) drug therapy: Secondary | ICD-10-CM | POA: Diagnosis not present

## 2019-02-03 DIAGNOSIS — J984 Other disorders of lung: Secondary | ICD-10-CM | POA: Diagnosis not present

## 2019-02-03 DIAGNOSIS — Z23 Encounter for immunization: Secondary | ICD-10-CM

## 2019-02-03 DIAGNOSIS — Z7982 Long term (current) use of aspirin: Secondary | ICD-10-CM | POA: Diagnosis not present

## 2019-02-03 DIAGNOSIS — M879 Osteonecrosis, unspecified: Secondary | ICD-10-CM | POA: Diagnosis not present

## 2019-02-03 LAB — CBC WITH DIFFERENTIAL/PLATELET
Absolute Monocytes: 631 cells/uL (ref 200–950)
BASOS PCT: 0.4 %
Basophils Absolute: 33 cells/uL (ref 0–200)
EOS PCT: 0.7 %
Eosinophils Absolute: 58 cells/uL (ref 15–500)
HCT: 42.3 % (ref 38.5–50.0)
Hemoglobin: 15 g/dL (ref 13.2–17.1)
Lymphs Abs: 3718 cells/uL (ref 850–3900)
MCH: 33.9 pg — ABNORMAL HIGH (ref 27.0–33.0)
MCHC: 35.5 g/dL (ref 32.0–36.0)
MCV: 95.7 fL (ref 80.0–100.0)
MONOS PCT: 7.6 %
MPV: 10 fL (ref 7.5–12.5)
NEUTROS PCT: 46.5 %
Neutro Abs: 3860 cells/uL (ref 1500–7800)
PLATELETS: 253 10*3/uL (ref 140–400)
RBC: 4.42 10*6/uL (ref 4.20–5.80)
RDW: 13.5 % (ref 11.0–15.0)
TOTAL LYMPHOCYTE: 44.8 %
WBC: 8.3 10*3/uL (ref 3.8–10.8)

## 2019-02-03 LAB — HIV-1 RNA QUANT-NO REFLEX-BLD
HIV 1 RNA Quant: 76 copies/mL — ABNORMAL HIGH
HIV-1 RNA Quant, Log: 1.88 Log copies/mL — ABNORMAL HIGH

## 2019-02-03 LAB — COMPLETE METABOLIC PANEL WITH GFR
AG RATIO: 1.3 (calc) (ref 1.0–2.5)
ALT: 8 U/L — AB (ref 9–46)
AST: 15 U/L (ref 10–40)
Albumin: 4.2 g/dL (ref 3.6–5.1)
Alkaline phosphatase (APISO): 85 U/L (ref 36–130)
BILIRUBIN TOTAL: 0.3 mg/dL (ref 0.2–1.2)
BUN: 12 mg/dL (ref 7–25)
CO2: 27 mmol/L (ref 20–32)
Calcium: 9.5 mg/dL (ref 8.6–10.3)
Chloride: 104 mmol/L (ref 98–110)
Creat: 1.11 mg/dL (ref 0.60–1.35)
GFR, Est African American: 97 mL/min/{1.73_m2} (ref >=60)
GFR, Est Non African American: 84 mL/min/{1.73_m2} (ref >=60)
GLOBULIN: 3.3 g/dL (ref 1.9–3.7)
Glucose, Bld: 89 mg/dL (ref 65–99)
POTASSIUM: 4.2 mmol/L (ref 3.5–5.3)
SODIUM: 139 mmol/L (ref 135–146)
Total Protein: 7.5 g/dL (ref 6.1–8.1)

## 2019-02-08 ENCOUNTER — Ambulatory Visit: Payer: Medicare Other | Admitting: Cardiovascular Disease

## 2019-03-06 ENCOUNTER — Ambulatory Visit (INDEPENDENT_AMBULATORY_CARE_PROVIDER_SITE_OTHER): Payer: Medicare Other | Admitting: Internal Medicine

## 2019-03-06 ENCOUNTER — Encounter: Payer: Self-pay | Admitting: Internal Medicine

## 2019-03-06 VITALS — BP 113/56 | HR 72 | Temp 98.2°F

## 2019-03-06 DIAGNOSIS — I2510 Atherosclerotic heart disease of native coronary artery without angina pectoris: Secondary | ICD-10-CM

## 2019-03-06 DIAGNOSIS — B20 Human immunodeficiency virus [HIV] disease: Secondary | ICD-10-CM | POA: Diagnosis not present

## 2019-03-06 DIAGNOSIS — Z79899 Other long term (current) drug therapy: Secondary | ICD-10-CM | POA: Diagnosis not present

## 2019-03-06 MED ORDER — BICTEGRAVIR-EMTRICITAB-TENOFOV 50-200-25 MG PO TABS: 1.0000 | ORAL_TABLET | Freq: Every day | ORAL | 5 refills | Status: DC

## 2019-03-06 MED ORDER — DARUNAVIR-COBICISTAT 800-150 MG PO TABS: ORAL_TABLET | ORAL | 5 refills | Status: DC

## 2019-03-06 NOTE — Progress Notes (Signed)
RFV: follow up for hiv disease  Patient ID: Rodney Walker, male   DOB: January 19, 1981, 38 y.o.   MRN: [id number]  HPI 38yoM with hiv disease , CD 4 count of 680/VL76 also has HX of CAD. And right hip AVN. On biktarvy-DRVc. Missed last night dose because he ran out of meds. Attempts to still have good oral intake  Outpatient Encounter Medications as of 03/06/2019  Medication Sig  . aspirin 81 MG EC tablet Take 1 tablet (81 mg total) by mouth daily.  Marland Kitchen atorvastatin (LIPITOR) 40 MG tablet Take 1 tablet (40 mg total) by mouth daily at 6 PM.  . bictegravir-emtricitabine-tenofovir AF (BIKTARVY) 50-200-25 MG TABS tablet Take 1 tablet by mouth daily.  . cetirizine (ZYRTEC) 10 MG tablet TAKE 1 TABLET(10 MG) BY MOUTH DAILY  . EFFIENT 10 MG TABS tablet Take 1 tablet by mouth daily.  Marland Kitchen lisinopril (PRINIVIL,ZESTRIL) 2.5 MG tablet Take 1 tablet (2.5 mg total) by mouth daily.  . metoprolol succinate (TOPROL-XL) 25 MG 24 hr tablet Take 1 tablet (25 mg total) by mouth daily.  . nitroGLYCERIN (NITROSTAT) 0.4 MG SL tablet Place 1 tablet (0.4 mg total) under the tongue every 5 (five) minutes x 3 doses as needed for chest pain.  Marland Kitchen oxyCODONE (OXY IR/ROXICODONE) 5 MG immediate release tablet Take 1 tablet (5 mg total) by mouth every 12 (twelve) hours as needed for up to 30 doses for breakthrough pain.  Marland Kitchen PREZCOBIX 800-150 MG tablet TAKE 1 TABLET BY MOUTH DAILY WITH BREAKFAST. SWALLOW WHOLE. DO NOT CRUSH, BREAK OR CHEW TABLETS.TAKE WITH FOOD   No facility-administered encounter medications on file as of 03/06/2019.      Patient Active Problem List   Diagnosis Date Noted  . Hyperlipidemia 04/05/2018  . Ischemic cardiomyopathy 04/05/2018  . History of acute inferior wall MI 03/24/2018  . Marijuana abuse, continuous 10/29/2016  . Moderate persistent asthma without complication 10/29/2016  . Allergic rhinitis 10/08/2016  . Anemia 05/03/2015  . Lung abscess (HCC) 04/22/2015  . Sepsis due to pneumonia  (HCC) 04/17/2015  . Bullous emphysema (HCC)   . Lobar pneumonia (HCC)   . Empyema lung (HCC) 04/16/2015  . HIV (human immunodeficiency virus infection) (HCC)   . Tobacco abuse 03/21/2015  . Lung mass 08/31/2014  . 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 Myalgia 04/23/2014  . AIDS (HCC) 03/22/2014  . CAP (community acquired pneumonia) 03/22/2014  . CAD (coronary artery disease) 11/13/2011     There are no preventive care reminders to display for this patient.   Review of Systems Review of Systems  Constitutional: Negative for fever, chills, diaphoresis, activity change, appetite change, fatigue and unexpected weight change.  HENT: Negative for congestion, sore throat, rhinorrhea, sneezing, trouble swallowing and sinus pressure.  Eyes: Negative for photophobia and visual disturbance.  Respiratory: Negative for cough, chest tightness, shortness of breath, wheezing and stridor.  Cardiovascular: Negative for chest pain, palpitations and leg swelling.  Gastrointestinal: Negative for nausea, vomiting, abdominal pain, diarrhea, constipation, blood in stool, abdominal distention and anal bleeding.  Genitourinary: Negative for dysuria, hematuria, flank pain and difficulty urinating.  Musculoskeletal: Negative for myalgias, back pain, joint swelling, arthralgias and gait problem.  Skin: Negative for color change, pallor, rash and wound.  Neurological: Negative for dizziness, tremors, weakness and light-headedness.  Hematological: Negative for adenopathy. Does not bruise/bleed easily.  Psychiatric/Behavioral: Negative for behavioral problems, confusion, sleep disturbance, dysphoric mood, decreased concentration and agitation.   Social History   Tobacco Use  . Smoking status: Former  Smoker    Packs/day: 0.25    Years: 17.00    Pack years: 4.25    Types: Cigarettes, Cigars    Quit date: 03/23/2015    Years since quitting: 4.2  . Smokeless tobacco: Never Used  Substance Use Topics  .  Alcohol use: No    Alcohol/week: 0.0 standard drinks  . Drug use: Yes    Frequency: 2.0 times per week    Types: Marijuana   Physical Exam   BP (!) 113/56   Pulse 72   Temp 98.2 F (36.8 C) (Oral)  Physical Exam  Constitutional: He is oriented to person, place, and time. He appears well-developed and well-nourished. No distress.  HENT:  Mouth/Throat: Oropharynx is clear and moist. No oropharyngeal exudate.  Cardiovascular: Normal rate, regular rhythm and normal heart sounds. Exam reveals no gallop and no friction rub.  No murmur heard.  Pulmonary/Chest: Effort normal and breath sounds normal. No respiratory distress. He has no wheezes.  Abdominal: Soft. Bowel sounds are normal. He exhibits no distension. There is no tenderness.  Lymphadenopathy:  He has no cervical adenopathy.  Neurological: He is alert and oriented to person, place, and time.  Skin: Skin is warm and dry. No rash noted. No erythema.  Psychiatric: He has a normal mood and affect. His behavior is normal.     Lab Results  Component Value Date   CD4TCELL 17 (L) 02/01/2019   Lab Results  Component Value Date   CD4TABS 680 02/01/2019   CD4TABS 700 11/07/2018   CD4TABS 640 08/08/2018   Lab Results  Component Value Date   HIV1RNAQUANT 76 (H) 02/01/2019   No results found for: HEPBSAB Lab Results  Component Value Date   LABRPR NON-REACTIVE 11/07/2018    CBC Lab Results  Component Value Date   WBC 8.3 02/01/2019   RBC 4.42 02/01/2019   HGB 15.0 02/01/2019   HCT 42.3 02/01/2019   PLT 253 02/01/2019   MCV 95.7 02/01/2019   MCH 33.9 (H) 02/01/2019   MCHC 35.5 02/01/2019   RDW 13.5 02/01/2019   LYMPHSABS 3,718 02/01/2019   MONOABS 1.2 (H) 03/24/2018   EOSABS 58 02/01/2019    BMET Lab Results  Component Value Date   NA 139 02/01/2019   K 4.2 02/01/2019   CL 104 02/01/2019   CO2 27 02/01/2019   GLUCOSE 89 02/01/2019   BUN 12 02/01/2019   CREATININE 1.11 02/01/2019   CALCIUM 9.5 02/01/2019    GFRNONAA 84 02/01/2019   GFRAA 97 02/01/2019      Assessment and Plan  hiv disease = has had difficulty with adherence due to in part needs synchronizing his hiv meds from pharmacy. Will coordinate with pharmacy. Otherwise he is well controlled. Plan to continue on current regimen  Long term med management = cr is stable.  Hx of CAD = will continue on lisinopril, metoprolol and effient

## 2019-03-16 ENCOUNTER — Other Ambulatory Visit: Payer: Self-pay | Admitting: Internal Medicine

## 2019-03-23 ENCOUNTER — Telehealth: Payer: Self-pay

## 2019-03-23 NOTE — Telephone Encounter (Signed)
Pt would like to have a telephone visit for his appt.  

## 2019-03-27 ENCOUNTER — Telehealth: Payer: Self-pay | Admitting: Cardiovascular Disease

## 2019-03-27 NOTE — Telephone Encounter (Signed)
Pt has given consent for a telephone call with Dr. Elease Hashimoto on 03/29/2019 at 3pm. Pt does not have capability to have BP, HR done from home but will have his weight ready.

## 2019-03-27 NOTE — Telephone Encounter (Signed)
YOUR CARDIOLOGY TEAM HAS ARRANGED FOR AN E-VISIT FOR YOUR APPOINTMENT - PLEASE REVIEW IMPORTANT INFORMATION BELOW SEVERAL DAYS PRIOR TO YOUR APPOINTMENT  Due to the recent COVID-19 pandemic, we are transitioning in-person office visits to tele-medicine visits in an effort to decrease unnecessary exposure to our patients and staff. Medicare and most insurances are covering these visits without a copay needed. You will need a working email and a smartphone or computer with a camera and microphone. For patients that do not have these items, we can still complete the visit using a telephone but do prefer video when possible. If possible, we also ask that you have a blood pressure cuff and scale at home to measure your blood pressure, heart rate and weight prior to your scheduled appointment. Patients with clinical needs that need an in-person evaluation and testing will still be able to come to the office if absolutely necessary. If you have any questions, feel free to call our office.     DOWNLOADING THE SOFTWARE  Download the [employer] WebEx app to enable video and telephone visits with your [facility] Provider.   Instructions for downloading [employer] WebEx: - Go to https://www.webex.com/downloads.html and follow the instructions, or download the app on your smartphone ([employer] Webex Meetings). - If you have technical difficulties with downloading WebEx, please call WebEx at [phone]. - Once the app is downloaded (can be done on either mobile or desktop computer), go to Settings in the upper left hand corner.  Be sure that camera and audio are enabled.  - You will receive an email message with a link to the meeting with a time to join for your tele-health visit.  - Please download the app and have settings configured prior to the appointment time.      2-3 DAYS BEFORE YOUR APPOINTMENT  One of our staff will call you to confirm that you have been able to set up your WebEx account. We will remind  you check your blood pressure, heart rate and weight prior to your scheduled appointment. If you have an Apple Watch or Kardia, please upload any pertinent ECG strips the day before or morning of your appointment to MyChart. Our staff will also make sure you have reviewed the consent and agree to move forward with your scheduled tele-health visit.    THE DAY OF YOUR APPOINTMENT  Approximately 15-20 minutes prior to your scheduled appointment, you will receive an e-mail directly from one of our staff member's [email or web address] e-mail accounts inviting you to join a WebEx meeting.  Please do not reply to that email - simply join the WebEx meeting.  Upon joining, a member of the office staff will speak with you initially through the WebEx platform to confirm medications, vital signs for the day and any symptoms you may be experiencing.  Please have this information available prior to the time of visit start.      CONSENT FOR TELE-HEALTH VISIT - PLEASE RVIEW  I hereby voluntarily request, consent and authorize [facility] and its employed or contracted physicians, physician assistants, nurse practitioners or other licensed health care professionals (the Practitioner), to provide me with telemedicine health care services (the "Services") as deemed necessary by the treating Practitioner. I acknowledge and consent to receive the Services by the Practitioner via telemedicine. I understand that the telemedicine visit will involve communicating with the Practitioner through live audiovisual communication technology and the disclosure of certain medical information by electronic transmission. I acknowledge that I have been given the opportunity to   request an in-person assessment or other available alternative prior to the telemedicine visit and am voluntarily participating in the telemedicine visit.  I understand that I have the right to withhold or withdraw my consent to the use of telemedicine in the course of  my care at any time, without affecting my right to future care or treatment, and that the Practitioner or I may terminate the telemedicine visit at any time. I understand that I have the right to inspect all information obtained and/or recorded in the course of the telemedicine visit and may receive copies of available information for a reasonable fee.  I understand that some of the potential risks of receiving the Services via telemedicine include:  . Delay or interruption in medical evaluation due to technological equipment failure or disruption; . Information transmitted may not be sufficient (e.g. poor resolution of images) to allow for appropriate medical decision making by the Practitioner; and/or  . In rare instances, security protocols could fail, causing a breach of personal health information.  Furthermore, I acknowledge that it is my responsibility to provide information about my medical history, conditions and care that is complete and accurate to the best of my ability. I acknowledge that Practitioner's advice, recommendations, and/or decision may be based on factors not within their control, such as incomplete or inaccurate data provided by me or distortions of diagnostic images or specimens that may result from electronic transmissions. I understand that the practice of medicine is not an exact science and that Practitioner makes no warranties or guarantees regarding treatment outcomes. I acknowledge that I will receive a copy of this consent concurrently upon execution via email to the email address I last provided but may also request a printed copy by calling the office of [facility].    I understand that my insurance will be billed for this visit.   I have read or had this consent read to me. . I understand the contents of this consent, which adequately explains the benefits and risks of the Services being provided via telemedicine.  . I have been provided ample opportunity to ask  questions regarding this consent and the Services and have had my questions answered to my satisfaction. . I give my informed consent for the services to be provided through the use of telemedicine in my medical care  By participating in this telemedicine visit I agree to the above.  

## 2019-03-27 NOTE — Telephone Encounter (Signed)
Spoke with patient regarding upcoming appointment.  He declined using webex and said that he would rather just have a personal phone call with Dr. Elease Hashimoto.  I confirmed all demographics, e-mail and My Chart activation.

## 2019-03-29 ENCOUNTER — Encounter: Payer: Self-pay | Admitting: Cardiovascular Disease

## 2019-03-29 ENCOUNTER — Other Ambulatory Visit: Payer: Self-pay

## 2019-03-29 ENCOUNTER — Telehealth (INDEPENDENT_AMBULATORY_CARE_PROVIDER_SITE_OTHER): Payer: Medicare Other | Admitting: Cardiovascular Disease

## 2019-03-29 VITALS — Ht 65.0 in | Wt 134.0 lb

## 2019-03-29 DIAGNOSIS — I2510 Atherosclerotic heart disease of native coronary artery without angina pectoris: Secondary | ICD-10-CM

## 2019-03-29 MED ORDER — NITROGLYCERIN 0.4 MG SL SUBL: 0.4000 mg | SUBLINGUAL_TABLET | SUBLINGUAL | 6 refills | Status: DC | PRN

## 2019-03-29 NOTE — Addendum Note (Signed)
Addended by: Levi Aland on: 03/29/2019 03:44 PM   Modules accepted: Orders

## 2019-03-29 NOTE — Patient Instructions (Signed)
Medication Instructions:  Your physician recommends that you continue on your current medications as directed. Please refer to the Current Medication list given to you today. **You have a refill of Nitroglycerin at your pharmacy  If you need a refill on your cardiac medications before your next appointment, please call your pharmacy.    Lab work: None Ordered   Testing/Procedures: None ordered  Follow-Up: At [facility], you and your health needs are our priority.  As part of our continuing mission to provide you with exceptional heart care, we have created designated Provider Care Teams.  These Care Teams include your primary Cardiologist (physician) and Advanced Practice Providers (APPs -  Physician Assistants and Nurse Practitioners) who all work together to provide you with the care you need, when you need it. You will need a follow up appointment in:  6 months.  Please call our office 2 months in advance to schedule this appointment.  You may see Kristeen Miss, MD or one of the following Advanced Practice Providers on your designated Care Team: Tereso Newcomer, PA-C [place], [place] . Berton Bon, NP

## 2019-03-29 NOTE — Progress Notes (Signed)
Virtual Visit via Telephone Note    Evaluation Performed:  Follow-up visit  This visit type was conducted due to national recommendations for restrictions regarding the COVID-19 Pandemic (e.g. social distancing).  This format is felt to be most appropriate for this patient at this time.  All issues noted in this document were discussed and addressed.  No physical exam was performed (except for noted visual exam findings with Video Visits).  Please refer to the patient's chart (MyChart message for video visits and phone note for telephone visits) for the patient's consent to telehealth for [facility].  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 male who presents via audio/video conferencing for a telehealth visit today.    Rodney Walker has a history of coronary artery disease.  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  He had atherectomy and then a drug-eluting stent placed in the right coronary artery.    Seems to be doing well Worked at [employer] and is now at [employer]  Has not worked in several weeks due to the COVID 19  No CP or dyspnea Not exercising much   Is taking his meds.  Smokes MJ, no longer smokes cigarettes. Has not had to take an NTG     The patient does not have symptoms concerning for COVID-19 infection (fever, chills, cough, or new shortness of breath).    Prior CV studies:   The following studies were reviewed today:    Past Medical History:  Diagnosis Date  . COPD (chronic obstructive pulmonary disease) (HCC)   . Coronary artery disease    MI 2010 - dissection/plaque rupture in LAD tx  with antiplt/antithrombotics >> relook cath with resolution (no PCI performed) // Inf STEMI 3/19: OM1 30; pRCA 100 >> DES // Echo 3/19:  mild LVH, EF 40-45, inf/inf-sept HK, trivial MR  . Eczema   . History of myocardial infarction 2010; 2019  . History of pleural empyema   . HIV infection (HCC)   . Hyperlipidemia   . Immune deficiency disorder (HCC)   . Ischemic cardiomyopathy 04/05/2018  . Moderate persistent asthma without complication 10/29/2016   Past Surgical History:  Procedure Laterality Date  . CARDIAC CATHETERIZATION  06/24/2009   EF 60%  . CARDIAC CATHETERIZATION  06/17/2009   EF 45%. ANTERIOR HYPOKINESIS  . CORONARY STENT INTERVENTION N/A 03/24/2018   Procedure: CORONARY STENT INTERVENTION;  Surgeon: Tonny Bollman, MD;  Location: [facility] INVASIVE CV LAB;  Service: Cardiovascular;  Laterality: N/A;  . CORONARY/GRAFT ACUTE MI REVASCULARIZATION N/A 03/24/2018   Procedure: Coronary/Graft Acute MI Revascularization;  Surgeon: Tonny Bollman, MD;  Location: [facility] INVASIVE CV LAB;  Service: Cardiovascular;  Laterality: N/A;  . LEFT HEART CATH AND CORONARY ANGIOGRAPHY N/A 03/24/2018   Procedure: LEFT HEART CATH AND CORONARY ANGIOGRAPHY;  Surgeon: Tonny Bollman, MD;  Location: [facility] INVASIVE CV LAB;  Service: Cardiovascular;  Laterality: N/A;  . US ECHOCARDIOGRAPHY  12/09/2009   EF 55-60%  . VIDEO ASSISTED THORACOSCOPY (VATS)/ LOBECTOMY Right 04/23/2015   Procedure: VIDEO ASSISTED THORACOSCOPY (VATS)/RIGHT upper  and right middle LOBECTOMY;  Surgeon: Kerin Perna, MD;  Location:  [facility] OR;  Service: Thoracic;  Laterality: Right;  Marland Kitchen VIDEO BRONCHOSCOPY Bilateral 09/05/2014   Procedure: VIDEO BRONCHOSCOPY WITH FLUORO;  Surgeon: Merwyn Katos, MD;  Location: [facility] ENDOSCOPY;  Service: Cardiopulmonary;  Laterality: Bilateral;  . VIDEO BRONCHOSCOPY Bilateral 04/10/2015   Procedure: VIDEO BRONCHOSCOPY WITH FLUORO;  Surgeon: Lupita Leash, MD;  Location: [facility] ENDOSCOPY;  Service: Cardiopulmonary;  Laterality:  Bilateral;     Current Meds  Medication Sig  . aspirin 81 MG EC tablet Take 1 tablet (81 mg total) by mouth daily.  . bictegravir-emtricitabine-tenofovir AF (BIKTARVY) 50-200-25 MG TABS tablet Take 1 tablet by mouth daily.  . cetirizine (ZYRTEC) 10 MG tablet TAKE 1 TABLET(10 MG) BY MOUTH DAILY  . darunavir-cobicistat (PREZCOBIX) 800-150 MG tablet TAKE 1 TABLET BY MOUTH DAILY WITH BREAKFAST. SWALLOW WHOLE. DO NOT CRUSH, BREAK OR CHEW TABLETS.TAKE WITH FOOD  . EFFIENT 10 MG TABS tablet Take 1 tablet by mouth daily.  . metoprolol succinate (TOPROL-XL) 25 MG 24 hr tablet Take 1 tablet (25 mg total) by mouth daily.  . nitroGLYCERIN (NITROSTAT) 0.4 MG SL tablet Place 1 tablet (0.4 mg total) under the tongue every 5 (five) minutes x 3 doses as needed for chest pain.  Marland Kitchen oxyCODONE (OXY IR/ROXICODONE) 5 MG immediate release tablet Take 1 tablet (5 mg total) by mouth every 12 (twelve) hours as needed for up to 30 doses for breakthrough pain.     Allergies:   Patient has no known allergies.   Social History   Tobacco Use  . Smoking status: Former Smoker    Packs/day: 0.25    Years: 17.00    Pack years: 4.25    Types: Cigarettes, Cigars    Last attempt to quit: 03/23/2015    Years since quitting: 4.0  . Smokeless tobacco: Never Used  Substance Use Topics  . Alcohol use: No    Alcohol/week: 0.0 standard drinks  . Drug use: Yes    Frequency: 2.0 times per week    Types: Marijuana     Family Hx: The patient's family history includes Asthma in his brother and mother; COPD in his mother; Diabetes in his mother; Eczema in his mother; Heart disease in his mother; Hyperlipidemia in his mother; Hypertension in his mother; Liver cancer in his father; Sleep apnea in his mother; Stroke in his mother.  ROS:   Please see the history of present illness.     All other systems reviewed and are negative.   Labs/Other Tests and Data Reviewed:    Recent Labs: 02/01/2019: ALT 8; BUN 12; Creat 1.11;  Hemoglobin 15.0; Platelets 253; Potassium 4.2; Sodium 139   Recent Lipid Panel Lab Results  Component Value Date/Time   CHOL 134 11/11/2018 11:46 AM   TRIG 65 11/11/2018 11:46 AM   HDL 50 11/11/2018 11:46 AM   CHOLHDL 2.7 11/11/2018 11:46 AM   CHOLHDL 3.9 03/25/2018 05:19 AM   LDLCALC 71 11/11/2018 11:46 AM    Wt Readings from Last 3 Encounters:  03/29/19 134 lb (60.8 kg)  02/01/19 134 lb (60.8 kg)  11/11/18 135 lb (61.2 kg)     Objective:    Vital Signs:  Ht 5\' 5"  (1.651 m)   Wt 134 lb (60.8 kg)   BMI 22.30 kg/m    No exam due to tele-medicine visit   ASSESSMENT & PLAN:    1.    CAD:   Is been a year since his PCI.  He is not had any episodes of chest pain or shortness  of breath. No bleeding issues Dr. Excell Seltzer had recommneded considering DAPT indeffinitely due to his multiple MIs   Continue ASA 81,  Continue Effient.   He's been of plavix at some point in the past but it was changed to Brilinta which was then  changed to effient.    Refill NTG  Will set up a follow up in 6 months   2.  Hyperlipidemia Lipids are ok. Not on a statin due to interaction with BIktarvy    COVID-19 Education: The signs and symptoms of COVID-19 were discussed with the patient and how to seek care for testing (follow up with PCP or arrange E-visit).  The importance of social distancing was discussed today.  Patient Risk:   After full review of this patient's clinical status, I feel that they are at least moderate risk at this time.  Time:   Today, I have spent 20  minutes with the patient with telehealth technology discussing  CAD .     Medication Adjustments/Labs and Tests Ordered: Current medicines are reviewed at length with the patient today.  Concerns regarding medicines are outlined above.  Tests Ordered: No orders of the defined types were placed in this encounter.  Medication Changes: No orders of the defined types were placed in this encounter.   Disposition:  Follow  up in 6 month(s)  Signed, Kristeen Miss, MD  03/29/2019 3:07 PM    [facility]

## 2019-04-28 ENCOUNTER — Other Ambulatory Visit: Payer: Self-pay | Admitting: Physician Assistant

## 2019-05-11 ENCOUNTER — Telehealth: Payer: Self-pay | Admitting: Cardiovascular Disease

## 2019-05-11 NOTE — Telephone Encounter (Signed)
Spoke with patient and advised that Dr. Elease Hashimoto will not write a note to excuse him from work due to he does not have any cardiac conditions that would cause him to need to be out of work at this time. I advised him to contact his PCP. He verbalized understanding and thanked me for the call.

## 2019-05-11 NOTE — Telephone Encounter (Signed)
Pt called and would like Dr. Elease Hashimoto to write him a letter to excuse him from work.  He works in [employer], and he feels unsafe in the work environment.

## 2019-06-01 ENCOUNTER — Other Ambulatory Visit: Payer: Self-pay | Admitting: *Deleted

## 2019-06-01 DIAGNOSIS — B20 Human immunodeficiency virus [HIV] disease: Secondary | ICD-10-CM

## 2019-06-06 ENCOUNTER — Other Ambulatory Visit: Payer: Medicare Other

## 2019-06-06 ENCOUNTER — Other Ambulatory Visit: Payer: Self-pay

## 2019-06-06 ENCOUNTER — Encounter: Payer: Self-pay | Admitting: *Deleted

## 2019-06-06 DIAGNOSIS — B20 Human immunodeficiency virus [HIV] disease: Secondary | ICD-10-CM | POA: Diagnosis not present

## 2019-06-07 LAB — T-HELPER CELL (CD4) - (RCID CLINIC ONLY)
CD4 % Helper T Cell: 20 % — ABNORMAL LOW (ref 33–65)
CD4 T Cell Abs: 594 /uL (ref 400–1790)

## 2019-06-17 LAB — CBC WITH DIFFERENTIAL/PLATELET
Absolute Monocytes: 656 cells/uL (ref 200–950)
Basophils Absolute: 16 cells/uL (ref 0–200)
Basophils Relative: 0.2 %
Eosinophils Absolute: 82 cells/uL (ref 15–500)
Eosinophils Relative: 1 %
HCT: 38.7 % (ref 38.5–50.0)
Hemoglobin: 13.7 g/dL (ref 13.2–17.1)
Lymphs Abs: 3378 cells/uL (ref 850–3900)
MCH: 33.9 pg — ABNORMAL HIGH (ref 27.0–33.0)
MCHC: 35.4 g/dL (ref 32.0–36.0)
MCV: 95.8 fL (ref 80.0–100.0)
MPV: 10 fL (ref 7.5–12.5)
Monocytes Relative: 8 %
Neutro Abs: 4067 cells/uL (ref 1500–7800)
Neutrophils Relative %: 49.6 %
Platelets: 245 10*3/uL (ref 140–400)
RBC: 4.04 10*6/uL — ABNORMAL LOW (ref 4.20–5.80)
RDW: 13.5 % (ref 11.0–15.0)
Total Lymphocyte: 41.2 %
WBC: 8.2 10*3/uL (ref 3.8–10.8)

## 2019-06-17 LAB — COMPLETE METABOLIC PANEL WITH GFR
AG Ratio: 1.3 (calc) (ref 1.0–2.5)
ALT: 5 U/L — ABNORMAL LOW (ref 9–46)
AST: 12 U/L (ref 10–40)
Albumin: 3.9 g/dL (ref 3.6–5.1)
Alkaline phosphatase (APISO): 77 U/L (ref 36–130)
BUN: 11 mg/dL (ref 7–25)
CO2: 24 mmol/L (ref 20–32)
Calcium: 8.8 mg/dL (ref 8.6–10.3)
Chloride: 108 mmol/L (ref 98–110)
Creat: 1.15 mg/dL (ref 0.60–1.35)
GFR, Est African American: 93 mL/min/{1.73_m2} (ref >=60)
GFR, Est Non African American: 80 mL/min/{1.73_m2} (ref >=60)
Globulin: 2.9 g/dL (calc) (ref 1.9–3.7)
Glucose, Bld: 101 mg/dL — ABNORMAL HIGH (ref 65–99)
Potassium: 3.6 mmol/L (ref 3.5–5.3)
Sodium: 139 mmol/L (ref 135–146)
Total Bilirubin: 0.4 mg/dL (ref 0.2–1.2)
Total Protein: 6.8 g/dL (ref 6.1–8.1)

## 2019-06-17 LAB — HIV-1 RNA QUANT-NO REFLEX-BLD
HIV 1 RNA Quant: 63 copies/mL — ABNORMAL HIGH
HIV-1 RNA Quant, Log: 1.8 Log copies/mL — ABNORMAL HIGH

## 2019-06-27 DIAGNOSIS — I10 Essential (primary) hypertension: Secondary | ICD-10-CM | POA: Diagnosis not present

## 2019-06-27 DIAGNOSIS — I2510 Atherosclerotic heart disease of native coronary artery without angina pectoris: Secondary | ICD-10-CM | POA: Diagnosis not present

## 2019-06-27 DIAGNOSIS — Z8249 Family history of ischemic heart disease and other diseases of the circulatory system: Secondary | ICD-10-CM | POA: Diagnosis not present

## 2019-06-27 DIAGNOSIS — E785 Hyperlipidemia, unspecified: Secondary | ICD-10-CM | POA: Diagnosis not present

## 2019-06-27 DIAGNOSIS — I252 Old myocardial infarction: Secondary | ICD-10-CM | POA: Diagnosis not present

## 2019-06-28 ENCOUNTER — Encounter: Payer: Self-pay | Admitting: Internal Medicine

## 2019-06-28 ENCOUNTER — Ambulatory Visit (INDEPENDENT_AMBULATORY_CARE_PROVIDER_SITE_OTHER): Payer: Medicare Other | Admitting: Internal Medicine

## 2019-06-28 ENCOUNTER — Other Ambulatory Visit: Payer: Self-pay

## 2019-06-28 VITALS — BP 108/63 | HR 73 | Temp 98.2°F | Wt 132.0 lb

## 2019-06-28 DIAGNOSIS — M25551 Pain in right hip: Secondary | ICD-10-CM | POA: Diagnosis not present

## 2019-06-28 DIAGNOSIS — B20 Human immunodeficiency virus [HIV] disease: Secondary | ICD-10-CM

## 2019-06-28 DIAGNOSIS — Z79899 Other long term (current) drug therapy: Secondary | ICD-10-CM

## 2019-06-28 DIAGNOSIS — M8700 Idiopathic aseptic necrosis of unspecified bone: Secondary | ICD-10-CM

## 2019-06-28 DIAGNOSIS — I2510 Atherosclerotic heart disease of native coronary artery without angina pectoris: Secondary | ICD-10-CM | POA: Diagnosis not present

## 2019-06-28 NOTE — Progress Notes (Signed)
RFV: follow up for HIV disease  Patient ID: [name], male   DOB: [birth year], 38 y.o.   MRN: [id number]  HPI [name] is a 38yo M with hiv disease, CD 4 count of 590/VL 68 - has low level viremia. On biktarvy which he takes daily. Has had ongoing right hip pain that wakes him from the night. He is now open to getting hip replacement surgery  Outpatient Encounter Medications as of 06/28/2019  Medication Sig  . aspirin 81 MG EC tablet Take 1 tablet (81 mg total) by mouth daily.  . bictegravir-emtricitabine-tenofovir AF (BIKTARVY) 50-200-25 MG TABS tablet Take 1 tablet by mouth daily.  . cetirizine (ZYRTEC) 10 MG tablet TAKE 1 TABLET(10 MG) BY MOUTH DAILY  . darunavir-cobicistat (PREZCOBIX) 800-150 MG tablet TAKE 1 TABLET BY MOUTH DAILY WITH BREAKFAST. SWALLOW WHOLE. DO NOT CRUSH, BREAK OR CHEW TABLETS.TAKE WITH FOOD  . EFFIENT 10 MG TABS tablet TAKE 1 TABLET BY MOUTH EVERY DAY(FIRST DOSE IS 60MG , BEGINNING SECOND DAY YOU WILL BE ON 10MG  DAILY)  . lisinopril (ZESTRIL) 2.5 MG tablet TAKE 1 TABLET(2.5 MG) BY MOUTH DAILY  . metoprolol succinate (TOPROL-XL) 25 MG 24 hr tablet Take 1 tablet (25 mg total) by mouth daily.  . nitroGLYCERIN (NITROSTAT) 0.4 MG SL tablet Place 1 tablet (0.4 mg total) under the tongue every 5 (five) minutes x 3 doses as needed for chest pain.  Marland Kitchen oxyCODONE (OXY IR/ROXICODONE) 5 MG immediate release tablet Take 1 tablet (5 mg total) by mouth every 12 (twelve) hours as needed for up to 30 doses for breakthrough pain.   No facility-administered encounter medications on file as of 06/28/2019.      Patient Active Problem List   Diagnosis Date Noted  . Hyperlipidemia 04/05/2018  . Ischemic cardiomyopathy 04/05/2018  . History of acute inferior wall MI 03/24/2018  . Marijuana abuse, continuous 10/29/2016  . Moderate persistent asthma without complication 10/29/2016  . Allergic rhinitis 10/08/2016  . Anemia 05/03/2015  . Lung abscess (HCC) 04/22/2015  .  Sepsis due to pneumonia (HCC) 04/17/2015  . Bullous emphysema (HCC)   . Lobar pneumonia (HCC)   . Empyema lung (HCC) 04/16/2015  . HIV (human immunodeficiency virus infection) (HCC)   . Tobacco abuse 03/21/2015  . Lung mass 08/31/2014  . 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 Myalgia 04/23/2014  . AIDS (HCC) 03/22/2014  . CAP (community acquired pneumonia) 03/22/2014  . CAD (coronary artery disease) 11/13/2011   Social History   Tobacco Use  . Smoking status: Former Smoker    Packs/day: 0.25    Years: 17.00    Pack years: 4.25    Types: Cigarettes, Cigars    Quit date: 03/23/2015    Years since quitting: 4.3  . Smokeless tobacco: Never Used  Substance Use Topics  . Alcohol use: No    Alcohol/week: 0.0 standard drinks  . Drug use: Yes    Frequency: 2.0 times per week    Types: Marijuana    There are no preventive care reminders to display for this patient.   Review of Systems Other than Oa, 12 point ros is negative Physical Exam   BP 108/63   Pulse 73   Temp 98.2 F (36.8 C)   Wt 132 lb (59.9 kg)   BMI 21.97 kg/m   Physical Exam  Constitutional: He is oriented to person, place, and time. He appears well-developed and well-nourished. No distress.  HENT:  Mouth/Throat: Oropharynx is clear and moist. No oropharyngeal exudate.  Cardiovascular: Normal  rate, regular rhythm and normal heart sounds. Exam reveals no gallop and no friction rub.  No murmur heard.  Pulmonary/Chest: Effort normal and breath sounds normal. No respiratory distress. He has no wheezes.  Abdominal: Soft. Bowel sounds are normal. He exhibits no distension. There is no tenderness.  Lymphadenopathy:  He has no cervical adenopathy.  Neurological: He is alert and oriented to person, place, and time.  Skin: Skin is warm and dry. No rash noted. No erythema.  Psychiatric: He has a normal mood and affect. His behavior is normal.    Lab Results  Component Value Date   CD4TCELL 20 (L) 06/06/2019    Lab Results  Component Value Date   CD4TABS 594 06/06/2019   CD4TABS 680 02/01/2019   CD4TABS 700 11/07/2018   Lab Results  Component Value Date   HIV1RNAQUANT 63 (H) 06/06/2019   No results found for: HEPBSAB Lab Results  Component Value Date   LABRPR NON-REACTIVE 11/07/2018    CBC Lab Results  Component Value Date   WBC 8.2 06/06/2019   RBC 4.04 (L) 06/06/2019   HGB 13.7 06/06/2019   HCT 38.7 06/06/2019   PLT 245 06/06/2019   MCV 95.8 06/06/2019   MCH 33.9 (H) 06/06/2019   MCHC 35.4 06/06/2019   RDW 13.5 06/06/2019   LYMPHSABS 3,378 06/06/2019   MONOABS 1.2 (H) 03/24/2018   EOSABS 82 06/06/2019    BMET Lab Results  Component Value Date   NA 139 06/06/2019   K 3.6 06/06/2019   CL 108 06/06/2019   CO2 24 06/06/2019   GLUCOSE 101 (H) 06/06/2019   BUN 11 06/06/2019   CREATININE 1.15 06/06/2019   CALCIUM 8.8 06/06/2019   GFRNONAA 80 06/06/2019   GFRAA 93 06/06/2019      Assessment and Plan   HIV disease = continue biktarvy, has well controlled hiv disease  Right hip pain, 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 Can also get 2nd opinion with dr Ninfa Linden  Long term medication management = cr stable, will continue to follow  Health maintenance = flu vaccine in the fall

## 2019-07-04 ENCOUNTER — Ambulatory Visit (INDEPENDENT_AMBULATORY_CARE_PROVIDER_SITE_OTHER): Payer: Medicare Other | Admitting: Orthopaedic Surgery

## 2019-07-04 ENCOUNTER — Other Ambulatory Visit: Payer: Self-pay

## 2019-07-04 ENCOUNTER — Ambulatory Visit: Payer: Medicare Other | Admitting: Orthopaedic Surgery

## 2019-07-04 ENCOUNTER — Encounter: Payer: Self-pay | Admitting: Orthopaedic Surgery

## 2019-07-04 ENCOUNTER — Ambulatory Visit (INDEPENDENT_AMBULATORY_CARE_PROVIDER_SITE_OTHER): Payer: Medicare Other

## 2019-07-04 DIAGNOSIS — I2510 Atherosclerotic heart disease of native coronary artery without angina pectoris: Secondary | ICD-10-CM

## 2019-07-04 DIAGNOSIS — M879 Osteonecrosis, unspecified: Secondary | ICD-10-CM | POA: Diagnosis not present

## 2019-07-04 NOTE — Progress Notes (Signed)
Office Visit Note   Patient: Rodney Walker           Date of Birth: 1981/11/13           MRN: [id number] Visit Date: 07/04/2019              Requested by: Rodney Munson, MD [address] [place],  [place] [address] PCP: Patient, No Pcp Per   Assessment & Plan: Visit Diagnoses:  1. Bilateral hip osteonecrosis (HCC)     Plan: Impression is symptomatic right femoral head avascular necrosis.  At this point patient is experiencing significant pain and difficulty with ADLs related to this therefore he would like to proceed with a total hip replacement in the near future.  He is aware of the risks and benefits and rehab and recovery and reasonable expectations for outcome and improvement in quality of life.  We will obtain preoperative cardiac clearance from Dr. Elease Walker first.  He does have a significant cardiac history.  We will be in touch with the patient regarding scheduling soon.  Follow-Up Instructions: Return if symptoms worsen or fail to improve.   Orders:  Orders Placed This Encounter  Procedures  . XR HIP UNILAT W OR W/O PELVIS 2-3 VIEWS RIGHT   No orders of the defined types were placed in this encounter.     Procedures: No procedures performed   Clinical Data: No additional findings.   Subjective: Chief Complaint  Patient presents with  . Right Hip - Pain    Rodney Walker is a 38 year old gentleman who I have seen in the past for bilateral hip AVN comes in for repeat evaluation of this.  He states that the pain in his right hip has progressively gotten worse over the last year.  He denies any left hip pain.  He is a well-controlled HIV patient.  He is fairly active.  He denies any changes in medical history.   Review of Systems  Constitutional: Negative.   All other systems reviewed and are negative.    Objective: Vital Signs: There were no vitals taken for this visit.  Physical Exam Vitals signs and nursing note reviewed.   Constitutional:      Appearance: He is well-developed.  Pulmonary:     Effort: Pulmonary effort is normal.  Abdominal:     Palpations: Abdomen is soft.  Skin:    General: Skin is warm.  Neurological:     Mental Status: He is alert and oriented to person, place, and time.  Psychiatric:        Behavior: Behavior normal.        Thought Content: Thought content normal.        Judgment: Judgment normal.     Ortho Exam Right hip exam is unchanged. Specialty Comments:  No specialty comments available.  Imaging: Xr Hip Unilat W Or W/o Pelvis 2-3 Views Right  Result Date: 07/04/2019 Stable bilateral femoral head avascular necrosis.  No femoral head flattening or degenerative joint disease    PMFS History: Patient Active Problem List   Diagnosis Date Noted  . Hyperlipidemia 04/05/2018  . Ischemic cardiomyopathy 04/05/2018  . History of acute inferior wall MI 03/24/2018  . Marijuana abuse, continuous 10/29/2016  . Moderate persistent asthma without complication 10/29/2016  . Allergic rhinitis 10/08/2016  . Anemia 05/03/2015  . Lung abscess (HCC) 04/22/2015  . Sepsis due to pneumonia (HCC) 04/17/2015  . Bullous emphysema (HCC)   . Lobar pneumonia (HCC)   . Empyema lung (  [place]) 04/16/2015  . HIV (human immunodeficiency virus infection) ([place])   . Tobacco abuse 03/21/2015  . Lung mass 08/31/2014  . 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 Myalgia 04/23/2014  . AIDS ([facility]) 03/22/2014  . CAP (community acquired pneumonia) 03/22/2014  . CAD (coronary artery disease) 11/13/2011   Past Medical History:  Diagnosis Date  . COPD (chronic obstructive pulmonary disease) ([facility])   . Coronary artery disease    MI 2010 - dissection/plaque rupture in LAD tx with antiplt/antithrombotics >> relook cath with resolution (no PCI performed) // Inf STEMI 3/19: OM1 30; pRCA 100 >> DES // Echo 3/19:  mild LVH, EF 40-45, inf/inf-sept HK, trivial MR  . Eczema   . History of myocardial infarction 2010;  2019  . History of pleural empyema   . HIV infection ([place])   . Hyperlipidemia   . Immune deficiency disorder ([facility])   . Ischemic cardiomyopathy 04/05/2018  . Moderate persistent asthma without complication 71/05/9677    Family History  Problem Relation Age of Onset  . Diabetes Mother   . Eczema Mother   . Asthma Mother   . COPD Mother   . Hypertension Mother   . Hyperlipidemia Mother   . Stroke Mother   . Heart disease Mother   . Sleep apnea Mother   . Liver cancer Father   . Asthma Brother     Past Surgical History:  Procedure Laterality Date  . CARDIAC CATHETERIZATION  06/24/2009   EF 60%  . CARDIAC CATHETERIZATION  06/17/2009   EF 45%. ANTERIOR HYPOKINESIS  . CORONARY STENT INTERVENTION N/A 03/24/2018   Procedure: CORONARY STENT INTERVENTION;  Surgeon: Sherren Mocha, MD;  Location: [facility];  Service: Cardiovascular;  Laterality: N/A;  . CORONARY/GRAFT ACUTE MI REVASCULARIZATION N/A 03/24/2018   Procedure: Coronary/Graft Acute MI Revascularization;  Surgeon: Sherren Mocha, MD;  Location: [facility];  Service: Cardiovascular;  Laterality: N/A;  . LEFT HEART CATH AND CORONARY ANGIOGRAPHY N/A 03/24/2018   Procedure: LEFT HEART CATH AND CORONARY ANGIOGRAPHY;  Surgeon: Sherren Mocha, MD;  Location: [facility];  Service: Cardiovascular;  Laterality: N/A;  . US ECHOCARDIOGRAPHY  12/09/2009   EF 55-60%  . VIDEO ASSISTED THORACOSCOPY (VATS)/ LOBECTOMY Right 04/23/2015   Procedure: VIDEO ASSISTED THORACOSCOPY (VATS)/RIGHT upper  and right middle LOBECTOMY;  Surgeon: Ivin Poot, MD;  Location: [facility];  Service: Thoracic;  Laterality: Right;  Marland Kitchen VIDEO BRONCHOSCOPY Bilateral 09/05/2014   Procedure: VIDEO BRONCHOSCOPY WITH FLUORO;  Surgeon: Wilhelmina Mcardle, MD;  Location: [facility];  Service: Cardiopulmonary;  Laterality: Bilateral;  . VIDEO BRONCHOSCOPY Bilateral 04/10/2015   Procedure: VIDEO BRONCHOSCOPY WITH FLUORO;  Surgeon: Juanito Doom, MD;  Location:  [facility];  Service: Cardiopulmonary;  Laterality: Bilateral;   Social History   Occupational History  . Occupation: unemployed  Tobacco Use  . Smoking status: Former Smoker    Packs/day: 0.25    Years: 17.00    Pack years: 4.25    Types: Cigarettes, Cigars    Quit date: 03/23/2015    Years since quitting: 4.2  . Smokeless tobacco: Never Used  Substance and Sexual Activity  . Alcohol use: No    Alcohol/week: 0.0 standard drinks  . Drug use: Yes    Frequency: 2.0 times per week    Types: Marijuana  . Sexual activity: Not Currently    Partners: Male    Comment: given condoms

## 2019-07-06 DIAGNOSIS — E785 Hyperlipidemia, unspecified: Secondary | ICD-10-CM | POA: Diagnosis not present

## 2019-07-06 DIAGNOSIS — Z8249 Family history of ischemic heart disease and other diseases of the circulatory system: Secondary | ICD-10-CM | POA: Diagnosis not present

## 2019-07-06 DIAGNOSIS — I252 Old myocardial infarction: Secondary | ICD-10-CM | POA: Diagnosis not present

## 2019-07-06 DIAGNOSIS — I10 Essential (primary) hypertension: Secondary | ICD-10-CM | POA: Diagnosis not present

## 2019-07-24 ENCOUNTER — Telehealth: Payer: Self-pay | Admitting: *Deleted

## 2019-07-24 NOTE — Telephone Encounter (Signed)
   [facility] Pre-operative Risk Assessment    Request for surgical clearance:  1. What type of surgery is being performed? RIGHT TOTAL HIP ARTHOPLASTY  2. When is this surgery scheduled? TBD  3. What type of clearance is required (medical clearance vs. Pharmacy clearance to hold med vs. Both)? MEDICAL  4. Are there any medications that need to be held prior to surgery and how long? EFFIENT, ASA  5. Practice name and name of physician performing surgery? [facility]; DR. Legrand Como XU  6. What is your office phone number [phone]   7.   What is your office fax number [phone]  8.   Anesthesia type (None, local, MAC, general) ? NOT LISTED, SPINAL?   Julaine Hua 07/24/2019, 3:43 PM  _________________________________________________________________   (provider comments below)

## 2019-07-25 ENCOUNTER — Telehealth: Payer: Self-pay

## 2019-07-25 NOTE — Telephone Encounter (Signed)
Patient cleared for surgery by cardiologist.  Dr. Acie Fredrickson said he can stop his Effient 7 days before surgery.  He does not want him to stop his Aspirin.  Okay to schedule?

## 2019-07-25 NOTE — Telephone Encounter (Signed)
Yes that's fine. Thanks  

## 2019-07-25 NOTE — Telephone Encounter (Signed)
   Primary Cardiologist: Mertie Moores, MD  Chart reviewed as part of pre-operative protocol coverage. Patient was contacted 07/25/2019 in reference to pre-operative risk assessment for pending surgery as outlined below.  Rodney Walker was last seen on 03/29/19 by Dr. Acie Fredrickson via telemedicine. He has hx of CAD with spontaneous dissection to LAD, inferior STEMI 02/2018 s/p PCI/DES to prox RCA, HIV, asthma, COPD, pleural empyema, ICM (EF >65% by cath but 40-45% by echo 02/2018). He was felt to be doing well at in-person OV 10/2018 and cleared for hip surgery at that time, and doing well in 03/2019 telemed visit as well. He did not end up having the hip surgery back in November and he is now revisiting pursuing it. It has been recommended to consider DAPT indefinitely due to multiple MIs.   RCRI is calculated at 6.6% indicating moderate risk of CV complications. He is doing well from a cardiac standpoint and actually runs 3x a week without any angina or dyspnea. No syncope, palpitations, or dizziness. He feels well, just limited at times of how long he can go because of hip pain but not for cardiac reason. Therefore, based on ACC/AHA guidelines, the patient would be at acceptable risk for the planned procedure without further cardiovascular testing. He is aware to notify of any new symptoms between now and surgery.  Regarding antiplatelet therapy, Dr. Acie Fredrickson has previously recommended that for his hip surgery he may hold Effient 7 days prior to procedure but Dr. Acie Fredrickson prefers that he continue aspirin during this time.   I will route this recommendation to the requesting party via Epic fax function and remove from pre-op pool.  Please call with questions.  Charlie Pitter, PA-C 07/25/2019, 9:14 AM

## 2019-09-14 ENCOUNTER — Other Ambulatory Visit: Payer: Self-pay

## 2019-09-18 NOTE — Progress Notes (Signed)
[facility] #[id number] [place], [place] - [phone] AT [facility] OF [facility] & [address] [place] [address] Phone: [phone] Fax: [phone]  Ellery Plunk, Powered by [facility] - [place], [place] - [address] [place] [address] Phone: [phone] Fax: [phone]      Your procedure is scheduled on September 28  Report to [facility] Main Entrance "A" at 0855 A.M., and check in at the Admitting office.  Call this number if you have problems the morning of surgery:  [phone]  Call [phone] if you have any questions prior to your surgery date Monday-Friday 8am-4pm    Remember:  Do not eat after midnight the night before your surgery  You may drink clear liquids until 0755 am the morning of your surgery.   Clear liquids allowed are: Water, Non-Citrus Juices (without pulp), Carbonated Beverages, Clear Tea, Black Coffee Only, and Gatorade    Enhanced Recovery after Surgery for Orthopedics Enhanced Recovery after Surgery is a protocol used to improve the stress on your body and your recovery after surgery.  Patient Instructions  . The night before surgery:  o No food after midnight. ONLY clear liquids after midnight  .  Marland Kitchen The day of surgery (if you do NOT have diabetes):  o Drink ONE (1) Pre-Surgery Clear Ensure as directed.   o This drink was given to you during your hospital  pre-op appointment visit. o The pre-op nurse will instruct you on the time to drink the  Pre-Surgery Ensure depending on your surgery time. Drink before 0755 am o Finish the drink at the designated time by the pre-op nurse.  o Nothing else to drink after completing the  Pre-Surgery Clear Ensure.         If you have questions, please contact your surgeon's office.    Take these medicines the morning of surgery with A SIP OF WATER  Aspirin bictegravir-emtricitabine-tenofovir AF  (BIKTARVY) cetirizine (ZYRTEC) darunavir-cobicistat (PREZCOBIX) metoprolol succinate (TOPROL-XL) oxyCODONE (OXY IR/ROXICODONE) if needed  Follow your surgeon's instructions on when to stop EFFIENT.  If no instructions were given by your surgeon then you will need to call the office to get those instructions.     7 days prior to surgery STOP taking any Aleve, Naproxen, Ibuprofen, Motrin, Advil, Goody's, BC's, all herbal medications, fish oil, and all vitamins.    The Morning of Surgery  Do not wear jewelry  Do not wear lotions, powders, or perfumes/colognes, or deodorant  Men may shave face and neck.  Do not bring valuables to the hospital.  [facility] is not responsible for any belongings or valuables.  If you are a smoker, DO NOT Smoke 24 hours prior to surgery IF you wear a CPAP at night please bring your mask, tubing, and machine the morning of surgery   Remember that you must have someone to transport you home after your surgery, and remain with you for 24 hours if you are discharged the same day.   Contacts, glasses, hearing aids, dentures or bridgework may not be worn into surgery.    Leave your suitcase in the car.  After surgery it may be brought to your room.  For patients admitted to the hospital, discharge time will be determined by your treatment team.  Patients discharged the day of surgery will not be allowed to drive home.    Special instructions:  [facility]- Preparing For Surgery  Before surgery, you can play an important role. Because skin is not sterile, your skin needs to be as free of germs as possible. You can reduce the number of germs on your skin by washing with CHG (chlorahexidine gluconate) Soap before surgery.  CHG is an antiseptic cleaner which kills germs and bonds with the skin to continue killing germs even after washing.    Oral Hygiene is also important to reduce your risk of infection.  Remember - BRUSH YOUR TEETH THE MORNING OF SURGERY  WITH YOUR REGULAR TOOTHPASTE  Please do not use if you have an allergy to CHG or antibacterial soaps. If your skin becomes reddened/irritated stop using the CHG.  Do not shave (including legs and underarms) for at least 48 hours prior to first CHG shower. It is OK to shave your face.  Please follow these instructions carefully.   1. Shower the NIGHT BEFORE SURGERY and the MORNING OF SURGERY with CHG Soap.   2. If you chose to wash your hair, wash your hair first as usual with your normal shampoo.  3. After you shampoo, rinse your hair and body thoroughly to remove the shampoo.  4. Use CHG as you would any other liquid soap. You can apply CHG directly to the skin and wash gently with a scrungie or a clean washcloth.   5. Apply the CHG Soap to your body ONLY FROM THE NECK DOWN.  Do not use on open wounds or open sores. Avoid contact with your eyes, ears, mouth and genitals (private parts). Wash Face and genitals (private parts)  with your normal soap.   6. Wash thoroughly, paying special attention to the area where your surgery will be performed.  7. Thoroughly rinse your body with warm water from the neck down.  8. DO NOT shower/wash with your normal soap after using and rinsing off the CHG Soap.  9. Pat yourself dry with a CLEAN TOWEL.  10. Wear CLEAN PAJAMAS to bed the night before surgery, wear comfortable clothes the morning of surgery  11. Place CLEAN SHEETS on your bed the night of your first shower and DO NOT SLEEP WITH PETS.    Day of Surgery:  Do not apply any deodorants/lotions. Please shower the morning of surgery with the CHG soap  Please wear clean clothes to the hospital/surgery center.   Remember to brush your teeth WITH YOUR REGULAR TOOTHPASTE.   Please read over the following fact sheets that you were given.

## 2019-09-19 ENCOUNTER — Ambulatory Visit (HOSPITAL_COMMUNITY)
Admission: RE | Admit: 2019-09-19 | Discharge: 2019-09-19 | Disposition: A | Discharge status: Home / Self Care | Payer: Medicare Other | Source: Ambulatory Visit | Attending: Physician Assistant | Admitting: Physician Assistant

## 2019-09-19 ENCOUNTER — Encounter (HOSPITAL_COMMUNITY)
Admission: RE | Admit: 2019-09-19 | Discharge: 2019-09-19 | Disposition: A | Discharge status: Home / Self Care | Payer: Medicare Other | Source: Ambulatory Visit | Attending: Orthopaedic Surgery | Admitting: Orthopaedic Surgery

## 2019-09-19 ENCOUNTER — Other Ambulatory Visit: Payer: Self-pay

## 2019-09-19 ENCOUNTER — Encounter (HOSPITAL_COMMUNITY): Payer: Self-pay

## 2019-09-19 DIAGNOSIS — M1611 Unilateral primary osteoarthritis, right hip: Secondary | ICD-10-CM

## 2019-09-19 DIAGNOSIS — Z7982 Long term (current) use of aspirin: Secondary | ICD-10-CM | POA: Diagnosis not present

## 2019-09-19 DIAGNOSIS — E785 Hyperlipidemia, unspecified: Secondary | ICD-10-CM | POA: Diagnosis not present

## 2019-09-19 DIAGNOSIS — I2510 Atherosclerotic heart disease of native coronary artery without angina pectoris: Secondary | ICD-10-CM | POA: Insufficient documentation

## 2019-09-19 DIAGNOSIS — Z01818 Encounter for other preprocedural examination: Secondary | ICD-10-CM | POA: Diagnosis present

## 2019-09-19 DIAGNOSIS — Z87891 Personal history of nicotine dependence: Secondary | ICD-10-CM | POA: Diagnosis not present

## 2019-09-19 DIAGNOSIS — I252 Old myocardial infarction: Secondary | ICD-10-CM | POA: Insufficient documentation

## 2019-09-19 DIAGNOSIS — Z79899 Other long term (current) drug therapy: Secondary | ICD-10-CM | POA: Insufficient documentation

## 2019-09-19 DIAGNOSIS — J449 Chronic obstructive pulmonary disease, unspecified: Secondary | ICD-10-CM | POA: Diagnosis not present

## 2019-09-19 DIAGNOSIS — Z21 Asymptomatic human immunodeficiency virus [HIV] infection status: Secondary | ICD-10-CM | POA: Insufficient documentation

## 2019-09-19 HISTORY — DX: Anemia, unspecified: D64.9

## 2019-09-19 HISTORY — DX: Acute myocardial infarction, unspecified: I21.9

## 2019-09-19 HISTORY — DX: Pneumonia, unspecified organism: J18.9

## 2019-09-19 LAB — COMPREHENSIVE METABOLIC PANEL
ALT: 12 U/L (ref 0–44)
AST: 18 U/L (ref 15–41)
Albumin: 3.5 g/dL (ref 3.5–5.0)
Alkaline Phosphatase: 73 U/L (ref 38–126)
Anion gap: 9 (ref 5–15)
BUN: 11 mg/dL (ref 6–20)
CO2: 26 mmol/L (ref 22–32)
Calcium: 9.1 mg/dL (ref 8.9–10.3)
Chloride: 104 mmol/L (ref 98–111)
Creatinine, Ser: 1.11 mg/dL (ref 0.61–1.24)
GFR calc Af Amer: >60 mL/min (ref >60)
GFR calc non Af Amer: >60 mL/min (ref >60)
Glucose, Bld: 92 mg/dL (ref 70–99)
Potassium: 3.5 mmol/L (ref 3.5–5.1)
Sodium: 139 mmol/L (ref 135–145)
Total Bilirubin: 0.3 mg/dL (ref 0.3–1.2)
Total Protein: 7.2 g/dL (ref 6.5–8.1)

## 2019-09-19 LAB — TYPE AND SCREEN
ABO/RH(D): O POS
Antibody Screen: NEGATIVE

## 2019-09-19 LAB — CBC WITH DIFFERENTIAL/PLATELET
Abs Immature Granulocytes: 0.02 10*3/uL (ref 0.00–0.07)
Basophils Absolute: 0 10*3/uL (ref 0.0–0.1)
Basophils Relative: 0 %
Eosinophils Absolute: 0.1 10*3/uL (ref 0.0–0.5)
Eosinophils Relative: 1 %
HCT: 39.9 % (ref 39.0–52.0)
Hemoglobin: 14.3 g/dL (ref 13.0–17.0)
Immature Granulocytes: 0 %
Lymphocytes Relative: 45 %
Lymphs Abs: 3 10*3/uL (ref 0.7–4.0)
MCH: 33.6 pg (ref 26.0–34.0)
MCHC: 35.8 g/dL (ref 30.0–36.0)
MCV: 93.9 fL (ref 80.0–100.0)
Monocytes Absolute: 0.7 10*3/uL (ref 0.1–1.0)
Monocytes Relative: 10 %
Neutro Abs: 3 10*3/uL (ref 1.7–7.7)
Neutrophils Relative %: 44 %
Platelets: 253 10*3/uL (ref 150–400)
RBC: 4.25 MIL/uL (ref 4.22–5.81)
RDW: 13.3 % (ref 11.5–15.5)
WBC: 6.9 10*3/uL (ref 4.0–10.5)
nRBC: 0 % (ref 0.0–0.2)

## 2019-09-19 LAB — PROTIME-INR
INR: 1.2 (ref 0.8–1.2)
Prothrombin Time: 14.8 seconds (ref 11.4–15.2)

## 2019-09-19 LAB — SURGICAL PCR SCREEN
MRSA, PCR: NEGATIVE
Staphylococcus aureus: NEGATIVE

## 2019-09-19 LAB — APTT: aPTT: 30 seconds (ref 24–36)

## 2019-09-19 NOTE — Progress Notes (Addendum)
PCP - Karolee Ohs Cardiologist - Mertie Moores  PPM/ICD - N/A Device Orders - N/A Rep Notified N/A  Chest x-ray - 09/19/19 EKG - 09/19/2019 Stress Test - N/A ECHO - Yes, 03/25/2018 Cardiac Cath - Yes, 03/24/2018  Sleep Study - N/A CPAP - N/A  Fasting Blood Sugar - N/A Checks Blood Sugar _____ times a day  Blood Thinner Instructions: Stop Effient 7 days prior to sx Aspirin Instructions: Continue ASA  ERAS Protcol - Yes PRE-SURGERY Ensure - Yes  COVID TEST- 09/19/2019   Anesthesia review: Yes, Cardiac clearance  Patient denies shortness of breath, fever, cough and chest pain at PAT appointment   Coronavirus Screening  Have you experienced the following symptoms:  Cough yes/no: No Fever (>100.46F)  yes/no: No Runny nose yes/no: No Sore throat yes/no: No Difficulty breathing/shortness of breath  yes/no: No  Have you or a family member traveled in the last 14 days and where? yes/no: No   If the patient indicates "YES" to the above questions, their PAT will be rescheduled to limit the exposure to others and, the surgeon will be notified. THE PATIENT WILL NEED TO BE ASYMPTOMATIC FOR 14 DAYS.   If the patient is not experiencing any of these symptoms, the PAT nurse will instruct them to NOT bring anyone with them to their appointment since they may have these symptoms or traveled as well.   Please remind your patients and families that hospital visitation restrictions are in effect and the importance of the restrictions.    Patient verbalized understanding of instructions that were given to them at the PAT appointment. Patient was also instructed that they will need to review over the PAT instructions again at home before surgery.

## 2019-09-20 NOTE — Anesthesia Preprocedure Evaluation (Addendum)
Anesthesia Evaluation  Patient identified by MRN, date of birth, ID band Patient awake    Reviewed: Allergy & Precautions, NPO status , Patient's Chart, lab work & pertinent test results  Airway Mallampati: II  TM Distance: >3 FB Neck ROM: Full    Dental no notable dental hx. (+) Teeth Intact, Dental Advisory Given   Pulmonary asthma , COPD, former smoker,    Pulmonary exam normal breath sounds clear to auscultation       Cardiovascular + CAD, + Past MI and + Cardiac Stents  Normal cardiovascular exam Rhythm:Regular Rate:Normal     Neuro/Psych negative neurological ROS  negative psych ROS   GI/Hepatic negative GI ROS, Neg liver ROS,   Endo/Other  negative endocrine ROS  Renal/GU negative Renal ROS  negative genitourinary   Musculoskeletal negative musculoskeletal ROS (+)   Abdominal   Peds negative pediatric ROS (+)  Hematology  (+) HIV,   Anesthesia Other Findings   Reproductive/Obstetrics negative OB ROS                           Anesthesia Physical Anesthesia Plan  ASA: III  Anesthesia Plan: General   Post-op Pain Management:    Induction: Intravenous  PONV Risk Score and Plan: 2 and Ondansetron, Treatment may vary due to age or medical condition and Midazolam  Airway Management Planned: Oral ETT  Additional Equipment:   Intra-op Plan:   Post-operative Plan: Extubation in OR  Informed Consent: I have reviewed the patients History and Physical, chart, labs and discussed the procedure including the risks, benefits and alternatives for the proposed anesthesia with the patient or authorized representative who has indicated his/her understanding and acceptance.     Dental advisory given  Plan Discussed with: CRNA  Anesthesia Plan Comments: (Cardiac clearance 07/25/19: "Rodney Walker was last seen on 03/29/19 by Dr. Acie Fredrickson via telemedicine. He has hx of CAD with  spontaneous dissection to LAD, inferior STEMI 02/2018 s/p PCI/DES to prox RCA, HIV, asthma, COPD, pleural empyema, ICM (EF >65% by cath but 40-45% by echo 02/2018). He was felt to be doing well at in-person OV 10/2018 and cleared for hip surgery at that time, and doing well in 03/2019 telemed visit as well. He did not end up having the hip surgery back in November and he is now revisiting pursuing it. It has been recommended to consider DAPT indefinitely due to multiple MIs. RCRI is calculated at 6.6% indicating moderate risk of CV complications. He is doing well from a cardiac standpoint and actually runs 3x a week without any angina or dyspnea. No syncope, palpitations, or dizziness. He feels well, just limited at times of how long he can go because of hip pain but not for cardiac reason. Therefore, based on ACC/AHA guidelines, the patient would be at acceptable risk for the planned procedure without further cardiovascular testing. He is aware to notify of any new symptoms between now and surgery. Regarding antiplatelet therapy, Dr. Acie Fredrickson has previously recommended that for his hip surgery he may hold Effient 7 days prior to procedure but Dr. Acie Fredrickson prefers that he continue aspirin during this time."  Follows with Dr. Baxter Flattery for HIV, last seen 06/28/19, well controlled per note.   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   TTE 03/25/18: - Procedure narrative: Transthoracic echocardiography. Image   quality was poor. The study was technically difficult, as a   result of poor acoustic windows. Intravenous contrast (Definity)  was administered. - Left ventricle: The cavity size was normal. There was mild   concentric hypertrophy. Systolic function was mildly to   moderately reduced. The estimated ejection fraction was in the   range of 40% to 45%. Inferior and inferoseptal hypokinesis. The   study is not technically sufficient to allow evaluation of LV   diastolic function. -  Mitral valve: Mildly thickened leaflets . There was trivial   regurgitation. - Left atrium: The atrium was normal in size. - Inferior vena cava: The vessel was dilated. The respirophasic   diameter changes were blunted (< 50%), consistent with elevated   central venous pressure. - Impressions: Compared to a prior study in 2016, the LVEF is lower   at 40-45% with inferior and inferoseptal hypokinesis. Impressions: - Compared to a prior study in 2016, the LVEF is lower at 40-45%   with inferior and inferoseptal hypokinesis.  Cath 03/24/18: 1.  Severe single-vessel coronary artery disease with acute thrombotic total occlusion of the RCA, treated successfully with aspiration thrombectomy and drug-eluting stent implantation (4.0 x 23 mm Sierra DES) 2.  Patent left main, left circumflex, and LAD with minimal irregularity 3.  Preserved overall LV systolic function with LVEF 65% and normal LVEDP  Recommendations: Dual antiplatelet therapy with aspirin and Brilinta at least 12 months.  The patient is now had 2 acute MIs in different vessels, both likely secondary to plaque rupture and possibly hypercoagulable condition.  Might consider long-term dual antiplatelet therapy if tolerated.  If no complications arise early discharge could be considered in 24-48 hours.)      Anesthesia Quick Evaluation

## 2019-09-21 ENCOUNTER — Other Ambulatory Visit (HOSPITAL_COMMUNITY)
Admission: RE | Admit: 2019-09-21 | Discharge: 2019-09-21 | Disposition: A | Discharge status: Home / Self Care | Payer: Medicare Other | Source: Ambulatory Visit | Attending: Orthopaedic Surgery | Admitting: Orthopaedic Surgery

## 2019-09-21 DIAGNOSIS — Z20828 Contact with and (suspected) exposure to other viral communicable diseases: Secondary | ICD-10-CM | POA: Insufficient documentation

## 2019-09-21 DIAGNOSIS — Z01812 Encounter for preprocedural laboratory examination: Secondary | ICD-10-CM | POA: Diagnosis present

## 2019-09-22 LAB — NOVEL CORONAVIRUS, NAA (HOSP ORDER, SEND-OUT TO REF LAB; TAT 18-24 HRS): SARS-CoV-2, NAA: NOT DETECTED

## 2019-09-22 MED ORDER — TRANEXAMIC ACID 1000 MG/10ML IV SOLN
2000.0000 mg | INTRAVENOUS | Status: DC
  Filled 2019-09-22: qty 20

## 2019-09-25 ENCOUNTER — Other Ambulatory Visit: Payer: Self-pay

## 2019-09-25 ENCOUNTER — Observation Stay (HOSPITAL_COMMUNITY): Payer: Medicare Other

## 2019-09-25 ENCOUNTER — Observation Stay (HOSPITAL_COMMUNITY)
Admission: RE | Admit: 2019-09-25 | Discharge: 2019-09-26 | Disposition: A | Discharge status: Home / Self Care | Payer: Medicare Other | Attending: Orthopaedic Surgery | Admitting: Orthopaedic Surgery

## 2019-09-25 ENCOUNTER — Ambulatory Visit (HOSPITAL_COMMUNITY): Payer: Medicare Other | Admitting: Physician Assistant

## 2019-09-25 ENCOUNTER — Encounter (HOSPITAL_COMMUNITY)
Admission: RE | Disposition: A | Discharge status: Home / Self Care | Payer: Self-pay | Source: Home / Self Care | Attending: Orthopaedic Surgery

## 2019-09-25 ENCOUNTER — Telehealth: Payer: Self-pay

## 2019-09-25 ENCOUNTER — Other Ambulatory Visit: Payer: Self-pay | Admitting: Internal Medicine

## 2019-09-25 ENCOUNTER — Ambulatory Visit (HOSPITAL_COMMUNITY): Payer: Medicare Other

## 2019-09-25 ENCOUNTER — Encounter (HOSPITAL_COMMUNITY): Payer: Self-pay | Admitting: Emergency Medicine

## 2019-09-25 ENCOUNTER — Ambulatory Visit (HOSPITAL_COMMUNITY): Payer: Medicare Other | Admitting: Certified Registered Nurse Anesthetist

## 2019-09-25 DIAGNOSIS — I255 Ischemic cardiomyopathy: Secondary | ICD-10-CM | POA: Insufficient documentation

## 2019-09-25 DIAGNOSIS — Z800 Family history of malignant neoplasm of digestive organs: Secondary | ICD-10-CM | POA: Diagnosis not present

## 2019-09-25 DIAGNOSIS — Z833 Family history of diabetes mellitus: Secondary | ICD-10-CM | POA: Diagnosis not present

## 2019-09-25 DIAGNOSIS — Z7982 Long term (current) use of aspirin: Secondary | ICD-10-CM | POA: Diagnosis not present

## 2019-09-25 DIAGNOSIS — J4540 Moderate persistent asthma, uncomplicated: Secondary | ICD-10-CM | POA: Diagnosis not present

## 2019-09-25 DIAGNOSIS — Z96641 Presence of right artificial hip joint: Secondary | ICD-10-CM | POA: Diagnosis present

## 2019-09-25 DIAGNOSIS — G8929 Other chronic pain: Secondary | ICD-10-CM | POA: Diagnosis not present

## 2019-09-25 DIAGNOSIS — M879 Osteonecrosis, unspecified: Principal | ICD-10-CM | POA: Insufficient documentation

## 2019-09-25 DIAGNOSIS — E785 Hyperlipidemia, unspecified: Secondary | ICD-10-CM | POA: Diagnosis not present

## 2019-09-25 DIAGNOSIS — Z825 Family history of asthma and other chronic lower respiratory diseases: Secondary | ICD-10-CM | POA: Diagnosis not present

## 2019-09-25 DIAGNOSIS — Z955 Presence of coronary angioplasty implant and graft: Secondary | ICD-10-CM | POA: Insufficient documentation

## 2019-09-25 DIAGNOSIS — Z79899 Other long term (current) drug therapy: Secondary | ICD-10-CM | POA: Insufficient documentation

## 2019-09-25 DIAGNOSIS — I252 Old myocardial infarction: Secondary | ICD-10-CM | POA: Diagnosis not present

## 2019-09-25 DIAGNOSIS — M25551 Pain in right hip: Secondary | ICD-10-CM | POA: Diagnosis not present

## 2019-09-25 DIAGNOSIS — Z8249 Family history of ischemic heart disease and other diseases of the circulatory system: Secondary | ICD-10-CM | POA: Insufficient documentation

## 2019-09-25 DIAGNOSIS — M87051 Idiopathic aseptic necrosis of right femur: Secondary | ICD-10-CM

## 2019-09-25 DIAGNOSIS — I2510 Atherosclerotic heart disease of native coronary artery without angina pectoris: Secondary | ICD-10-CM | POA: Insufficient documentation

## 2019-09-25 DIAGNOSIS — J449 Chronic obstructive pulmonary disease, unspecified: Secondary | ICD-10-CM | POA: Diagnosis not present

## 2019-09-25 DIAGNOSIS — Z419 Encounter for procedure for purposes other than remedying health state, unspecified: Secondary | ICD-10-CM

## 2019-09-25 DIAGNOSIS — Z21 Asymptomatic human immunodeficiency virus [HIV] infection status: Secondary | ICD-10-CM | POA: Insufficient documentation

## 2019-09-25 DIAGNOSIS — B20 Human immunodeficiency virus [HIV] disease: Secondary | ICD-10-CM

## 2019-09-25 DIAGNOSIS — Z87891 Personal history of nicotine dependence: Secondary | ICD-10-CM | POA: Insufficient documentation

## 2019-09-25 DIAGNOSIS — L309 Dermatitis, unspecified: Secondary | ICD-10-CM | POA: Insufficient documentation

## 2019-09-25 DIAGNOSIS — Z823 Family history of stroke: Secondary | ICD-10-CM | POA: Diagnosis not present

## 2019-09-25 DIAGNOSIS — Z96649 Presence of unspecified artificial hip joint: Secondary | ICD-10-CM

## 2019-09-25 HISTORY — PX: TOTAL HIP ARTHROPLASTY: SHX124

## 2019-09-25 SURGERY — ARTHROPLASTY, HIP, TOTAL, ANTERIOR APPROACH
Anesthesia: Spinal | Site: Hip | Laterality: Right

## 2019-09-25 MED ORDER — LISINOPRIL 5 MG PO TABS
2.5000 mg | ORAL_TABLET | Freq: Every day | ORAL | Status: DC
  Administered 2019-09-26: 2.5 mg via ORAL
  Filled 2019-09-25: qty 1

## 2019-09-25 MED ORDER — CEFAZOLIN SODIUM-DEXTROSE 2-4 GM/100ML-% IV SOLN
2.0000 g | Freq: Four times a day (QID) | INTRAVENOUS | Status: AC
  Administered 2019-09-25 – 2019-09-26 (×3): 2 g via INTRAVENOUS
  Filled 2019-09-25 (×3): qty 100

## 2019-09-25 MED ORDER — ROCURONIUM BROMIDE 10 MG/ML (PF) SYRINGE
PREFILLED_SYRINGE | INTRAVENOUS | Status: AC
  Filled 2019-09-25: qty 10

## 2019-09-25 MED ORDER — ASPIRIN EC 81 MG PO TBEC: 81.0000 mg | DELAYED_RELEASE_TABLET | Freq: Two times a day (BID) | ORAL | 0 refills | Status: DC

## 2019-09-25 MED ORDER — LACTATED RINGERS IV SOLN
INTRAVENOUS | Status: DC
  Administered 2019-09-25 (×2): via INTRAVENOUS

## 2019-09-25 MED ORDER — ONDANSETRON HCL 4 MG/2ML IJ SOLN
INTRAMUSCULAR | Status: DC | PRN
  Administered 2019-09-25: 4 mg via INTRAVENOUS

## 2019-09-25 MED ORDER — KETOROLAC TROMETHAMINE 15 MG/ML IJ SOLN
15.0000 mg | Freq: Four times a day (QID) | INTRAMUSCULAR | Status: AC
  Administered 2019-09-25 – 2019-09-26 (×4): 15 mg via INTRAVENOUS
  Filled 2019-09-25 (×4): qty 1

## 2019-09-25 MED ORDER — LIDOCAINE 2% (20 MG/ML) 5 ML SYRINGE
INTRAMUSCULAR | Status: AC
  Filled 2019-09-25: qty 5

## 2019-09-25 MED ORDER — ACETAMINOPHEN 325 MG PO TABS: 325.0000 mg | ORAL_TABLET | Freq: Four times a day (QID) | ORAL | Status: DC | PRN

## 2019-09-25 MED ORDER — SUCCINYLCHOLINE CHLORIDE 20 MG/ML IJ SOLN
INTRAMUSCULAR | Status: DC | PRN
  Administered 2019-09-25: 120 mg via INTRAVENOUS

## 2019-09-25 MED ORDER — OXYCODONE HCL 5 MG PO TABS: 5.0000 mg | ORAL_TABLET | ORAL | Status: DC | PRN

## 2019-09-25 MED ORDER — ONDANSETRON HCL 4 MG/2ML IJ SOLN
INTRAMUSCULAR | Status: AC
  Filled 2019-09-25: qty 4

## 2019-09-25 MED ORDER — POVIDONE-IODINE 10 % EX SWAB
2.0000 "application " | Freq: Once | CUTANEOUS | Status: AC
  Administered 2019-09-25: 2 via TOPICAL

## 2019-09-25 MED ORDER — FENTANYL CITRATE (PF) 100 MCG/2ML IJ SOLN
INTRAMUSCULAR | Status: DC | PRN
  Administered 2019-09-25: 50 ug via INTRAVENOUS
  Administered 2019-09-25 (×2): 100 ug via INTRAVENOUS

## 2019-09-25 MED ORDER — TRANEXAMIC ACID-NACL 1000-0.7 MG/100ML-% IV SOLN
1000.0000 mg | INTRAVENOUS | Status: DC
  Filled 2019-09-25: qty 100

## 2019-09-25 MED ORDER — DEXAMETHASONE SODIUM PHOSPHATE 10 MG/ML IJ SOLN
INTRAMUSCULAR | Status: DC | PRN
  Administered 2019-09-25: 5 mg via INTRAVENOUS

## 2019-09-25 MED ORDER — SENNOSIDES-DOCUSATE SODIUM 8.6-50 MG PO TABS: 1.0000 | ORAL_TABLET | Freq: Every evening | ORAL | 1 refills | Status: DC | PRN

## 2019-09-25 MED ORDER — DEXAMETHASONE SODIUM PHOSPHATE 10 MG/ML IJ SOLN
INTRAMUSCULAR | Status: AC
  Filled 2019-09-25: qty 1

## 2019-09-25 MED ORDER — TRANEXAMIC ACID 1000 MG/10ML IV SOLN
INTRAVENOUS | Status: DC | PRN
  Administered 2019-09-25: 2000 mg via TOPICAL

## 2019-09-25 MED ORDER — OXYCODONE HCL ER 10 MG PO T12A
10.0000 mg | EXTENDED_RELEASE_TABLET | Freq: Two times a day (BID) | ORAL | Status: DC
  Administered 2019-09-25 – 2019-09-26 (×2): 10 mg via ORAL
  Filled 2019-09-25 (×2): qty 1

## 2019-09-25 MED ORDER — SODIUM CHLORIDE 0.9 % IR SOLN
Status: DC | PRN
  Administered 2019-09-25: 3000 mL

## 2019-09-25 MED ORDER — ALUM & MAG HYDROXIDE-SIMETH 200-200-20 MG/5ML PO SUSP: 30.0000 mL | ORAL | Status: DC | PRN

## 2019-09-25 MED ORDER — ONDANSETRON HCL 4 MG/2ML IJ SOLN: 4.0000 mg | Freq: Four times a day (QID) | INTRAMUSCULAR | Status: DC | PRN

## 2019-09-25 MED ORDER — METOPROLOL SUCCINATE ER 25 MG PO TB24
25.0000 mg | ORAL_TABLET | Freq: Every day | ORAL | Status: DC
  Administered 2019-09-26: 25 mg via ORAL
  Filled 2019-09-25: qty 1

## 2019-09-25 MED ORDER — SUCCINYLCHOLINE CHLORIDE 200 MG/10ML IV SOSY
PREFILLED_SYRINGE | INTRAVENOUS | Status: AC
  Filled 2019-09-25: qty 10

## 2019-09-25 MED ORDER — DIPHENHYDRAMINE HCL 12.5 MG/5ML PO ELIX: 25.0000 mg | ORAL_SOLUTION | ORAL | Status: DC | PRN

## 2019-09-25 MED ORDER — CHLORHEXIDINE GLUCONATE 4 % EX LIQD: 60.0000 mL | Freq: Once | CUTANEOUS | Status: DC

## 2019-09-25 MED ORDER — PROPOFOL 10 MG/ML IV BOLUS
INTRAVENOUS | Status: DC | PRN
  Administered 2019-09-25: 120 mg via INTRAVENOUS

## 2019-09-25 MED ORDER — PHENOL 1.4 % MT LIQD: 1.0000 | OROMUCOSAL | Status: DC | PRN

## 2019-09-25 MED ORDER — FENTANYL CITRATE (PF) 250 MCG/5ML IJ SOLN
INTRAMUSCULAR | Status: AC
  Filled 2019-09-25: qty 5

## 2019-09-25 MED ORDER — TRANEXAMIC ACID-NACL 1000-0.7 MG/100ML-% IV SOLN
1000.0000 mg | Freq: Once | INTRAVENOUS | Status: AC
  Administered 2019-09-25: 1000 mg via INTRAVENOUS
  Filled 2019-09-25: qty 100

## 2019-09-25 MED ORDER — 0.9 % SODIUM CHLORIDE (POUR BTL) OPTIME
TOPICAL | Status: DC | PRN
  Administered 2019-09-25: 1000 mL

## 2019-09-25 MED ORDER — METHOCARBAMOL 750 MG PO TABS: 750.0000 mg | ORAL_TABLET | Freq: Two times a day (BID) | ORAL | 0 refills | Status: DC | PRN

## 2019-09-25 MED ORDER — OXYCODONE HCL 5 MG PO TABS: 5.0000 mg | ORAL_TABLET | Freq: Three times a day (TID) | ORAL | 0 refills | Status: DC | PRN

## 2019-09-25 MED ORDER — MIDAZOLAM HCL 2 MG/2ML IJ SOLN
INTRAMUSCULAR | Status: AC
  Filled 2019-09-25: qty 2

## 2019-09-25 MED ORDER — ASPIRIN 81 MG PO CHEW
81.0000 mg | CHEWABLE_TABLET | Freq: Every day | ORAL | Status: DC
  Administered 2019-09-25 – 2019-09-26 (×2): 81 mg via ORAL
  Filled 2019-09-25 (×2): qty 1

## 2019-09-25 MED ORDER — NITROGLYCERIN 0.4 MG SL SUBL: 0.4000 mg | SUBLINGUAL_TABLET | SUBLINGUAL | Status: DC | PRN

## 2019-09-25 MED ORDER — PHENYLEPHRINE 40 MCG/ML (10ML) SYRINGE FOR IV PUSH (FOR BLOOD PRESSURE SUPPORT)
PREFILLED_SYRINGE | INTRAVENOUS | Status: AC
  Filled 2019-09-25: qty 10

## 2019-09-25 MED ORDER — BICTEGRAVIR-EMTRICITAB-TENOFOV 50-200-25 MG PO TABS
1.0000 | ORAL_TABLET | Freq: Every day | ORAL | Status: DC
  Administered 2019-09-26: 1 via ORAL
  Filled 2019-09-25: qty 1

## 2019-09-25 MED ORDER — PROMETHAZINE HCL 25 MG PO TABS: 25.0000 mg | ORAL_TABLET | Freq: Four times a day (QID) | ORAL | 1 refills | Status: DC | PRN

## 2019-09-25 MED ORDER — HYDROMORPHONE HCL 1 MG/ML IJ SOLN: 0.5000 mg | INTRAMUSCULAR | Status: DC | PRN

## 2019-09-25 MED ORDER — ROCURONIUM BROMIDE 100 MG/10ML IV SOLN
INTRAVENOUS | Status: DC | PRN
  Administered 2019-09-25: 70 mg via INTRAVENOUS

## 2019-09-25 MED ORDER — ACETAMINOPHEN 500 MG PO TABS
1000.0000 mg | ORAL_TABLET | Freq: Four times a day (QID) | ORAL | Status: AC
  Administered 2019-09-25 – 2019-09-26 (×4): 1000 mg via ORAL
  Filled 2019-09-25 (×4): qty 2

## 2019-09-25 MED ORDER — BUPIVACAINE LIPOSOME 1.3 % IJ SUSP
20.0000 mL | INTRAMUSCULAR | Status: DC
  Filled 2019-09-25: qty 20

## 2019-09-25 MED ORDER — MAGNESIUM CITRATE PO SOLN: 1.0000 | Freq: Once | ORAL | Status: DC | PRN

## 2019-09-25 MED ORDER — ONDANSETRON HCL 4 MG PO TABS: 4.0000 mg | ORAL_TABLET | Freq: Three times a day (TID) | ORAL | 0 refills | Status: DC | PRN

## 2019-09-25 MED ORDER — OXYCODONE HCL 5 MG/5ML PO SOLN: 5.0000 mg | Freq: Once | ORAL | Status: DC | PRN

## 2019-09-25 MED ORDER — OXYCODONE HCL 5 MG PO TABS: 5.0000 mg | ORAL_TABLET | Freq: Once | ORAL | Status: DC | PRN

## 2019-09-25 MED ORDER — VANCOMYCIN HCL 1 G IV SOLR
INTRAVENOUS | Status: DC | PRN
  Administered 2019-09-25: 1000 mg

## 2019-09-25 MED ORDER — OXYCODONE HCL ER 10 MG PO T12A: 10.0000 mg | EXTENDED_RELEASE_TABLET | Freq: Two times a day (BID) | ORAL | 0 refills | Status: AC

## 2019-09-25 MED ORDER — OXYCODONE HCL 5 MG PO TABS: 10.0000 mg | ORAL_TABLET | ORAL | Status: DC | PRN

## 2019-09-25 MED ORDER — ONDANSETRON HCL 4 MG PO TABS: 4.0000 mg | ORAL_TABLET | Freq: Four times a day (QID) | ORAL | Status: DC | PRN

## 2019-09-25 MED ORDER — SORBITOL 70 % SOLN: 30.0000 mL | Freq: Every day | Status: DC | PRN

## 2019-09-25 MED ORDER — DEXAMETHASONE SODIUM PHOSPHATE 10 MG/ML IJ SOLN
10.0000 mg | Freq: Once | INTRAMUSCULAR | Status: AC
  Administered 2019-09-26: 10 mg via INTRAVENOUS
  Filled 2019-09-25: qty 1

## 2019-09-25 MED ORDER — DOCUSATE SODIUM 100 MG PO CAPS
100.0000 mg | ORAL_CAPSULE | Freq: Two times a day (BID) | ORAL | Status: DC
  Administered 2019-09-25 – 2019-09-26 (×2): 100 mg via ORAL
  Filled 2019-09-25 (×2): qty 1

## 2019-09-25 MED ORDER — HYDROMORPHONE HCL 1 MG/ML IJ SOLN
0.2500 mg | INTRAMUSCULAR | Status: DC | PRN
  Administered 2019-09-25 (×2): 0.5 mg via INTRAVENOUS

## 2019-09-25 MED ORDER — MENTHOL 3 MG MT LOZG: 1.0000 | LOZENGE | OROMUCOSAL | Status: DC | PRN

## 2019-09-25 MED ORDER — PRASUGREL HCL 10 MG PO TABS
10.0000 mg | ORAL_TABLET | Freq: Every day | ORAL | Status: DC
  Administered 2019-09-26: 10 mg via ORAL
  Filled 2019-09-25: qty 1

## 2019-09-25 MED ORDER — GABAPENTIN 300 MG PO CAPS
300.0000 mg | ORAL_CAPSULE | Freq: Three times a day (TID) | ORAL | Status: DC
  Administered 2019-09-25 – 2019-09-26 (×3): 300 mg via ORAL
  Filled 2019-09-25 (×3): qty 1

## 2019-09-25 MED ORDER — METOCLOPRAMIDE HCL 5 MG/ML IJ SOLN: 5.0000 mg | Freq: Three times a day (TID) | INTRAMUSCULAR | Status: DC | PRN

## 2019-09-25 MED ORDER — HYDROMORPHONE HCL 1 MG/ML IJ SOLN
INTRAMUSCULAR | Status: AC
  Filled 2019-09-25: qty 1

## 2019-09-25 MED ORDER — METOCLOPRAMIDE HCL 5 MG PO TABS: 5.0000 mg | ORAL_TABLET | Freq: Three times a day (TID) | ORAL | Status: DC | PRN

## 2019-09-25 MED ORDER — CEFAZOLIN SODIUM-DEXTROSE 2-4 GM/100ML-% IV SOLN
2.0000 g | INTRAVENOUS | Status: AC
  Administered 2019-09-25: 11:00:00 2 g via INTRAVENOUS
  Filled 2019-09-25: qty 100

## 2019-09-25 MED ORDER — LIDOCAINE HCL (CARDIAC) PF 100 MG/5ML IV SOSY
PREFILLED_SYRINGE | INTRAVENOUS | Status: DC | PRN
  Administered 2019-09-25: 60 mg via INTRAVENOUS

## 2019-09-25 MED ORDER — SODIUM CHLORIDE 0.9 % IV SOLN
INTRAVENOUS | Status: DC
  Administered 2019-09-25: 14:00:00 via INTRAVENOUS

## 2019-09-25 MED ORDER — DARUNAVIR-COBICISTAT 800-150 MG PO TABS
1.0000 | ORAL_TABLET | Freq: Every day | ORAL | Status: DC
  Administered 2019-09-26: 1 via ORAL
  Filled 2019-09-25: qty 1

## 2019-09-25 MED ORDER — MIDAZOLAM HCL 5 MG/5ML IJ SOLN
INTRAMUSCULAR | Status: DC | PRN
  Administered 2019-09-25: 2 mg via INTRAVENOUS

## 2019-09-25 MED ORDER — METHOCARBAMOL 1000 MG/10ML IJ SOLN
500.0000 mg | Freq: Four times a day (QID) | INTRAVENOUS | Status: DC | PRN
  Filled 2019-09-25: qty 5

## 2019-09-25 MED ORDER — PROMETHAZINE HCL 25 MG/ML IJ SOLN: 6.2500 mg | INTRAMUSCULAR | Status: DC | PRN

## 2019-09-25 MED ORDER — VANCOMYCIN HCL 1000 MG IV SOLR
INTRAVENOUS | Status: AC
  Filled 2019-09-25: qty 1000

## 2019-09-25 MED ORDER — SUGAMMADEX SODIUM 200 MG/2ML IV SOLN
INTRAVENOUS | Status: DC | PRN
  Administered 2019-09-25: 200 mg via INTRAVENOUS

## 2019-09-25 MED ORDER — PHENYLEPHRINE HCL (PRESSORS) 10 MG/ML IV SOLN
INTRAVENOUS | Status: DC | PRN
  Administered 2019-09-25: 40 ug via INTRAVENOUS
  Administered 2019-09-25: 80 ug via INTRAVENOUS
  Administered 2019-09-25 (×3): 40 ug via INTRAVENOUS

## 2019-09-25 MED ORDER — METHOCARBAMOL 500 MG PO TABS: 750.0000 mg | ORAL_TABLET | Freq: Four times a day (QID) | ORAL | Status: DC | PRN

## 2019-09-25 MED ORDER — POLYETHYLENE GLYCOL 3350 17 G PO PACK: 17.0000 g | PACK | Freq: Every day | ORAL | Status: DC | PRN

## 2019-09-25 SURGICAL SUPPLY — 60 items
ACETAB CUP W GRIPTION 54MM (Plate) ×1 IMPLANT
ACETAB CUP W/GRIPTION 54 (Plate) ×2 IMPLANT
BAG DECANTER FOR FLEXI CONT (MISCELLANEOUS) ×3 IMPLANT
CELLS DAT CNTRL 66122 CELL SVR (MISCELLANEOUS) IMPLANT
CLOSURE STERI-STRIP 1/2X4 (GAUZE/BANDAGES/DRESSINGS) ×1
CLSR STERI-STRIP ANTIMIC 1/2X4 (GAUZE/BANDAGES/DRESSINGS) ×2 IMPLANT
COVER PERINEAL POST (MISCELLANEOUS) ×3 IMPLANT
COVER SURGICAL LIGHT HANDLE (MISCELLANEOUS) ×3 IMPLANT
COVER WAND RF STERILE (DRAPES) ×3 IMPLANT
CUP ACETAB W/GRIPTION 54 (Plate) ×1 IMPLANT
DRAPE C-ARM 42X72 X-RAY (DRAPES) ×3 IMPLANT
DRAPE POUCH INSTRU U-SHP 10X18 (DRAPES) ×3 IMPLANT
DRAPE STERI IOBAN 125X83 (DRAPES) ×3 IMPLANT
DRAPE U-SHAPE 47X51 STRL (DRAPES) ×6 IMPLANT
DRSG AQUACEL AG ADV 3.5X10 (GAUZE/BANDAGES/DRESSINGS) ×3 IMPLANT
DURAPREP 26ML APPLICATOR (WOUND CARE) ×6 IMPLANT
ELECT BLADE 4.0 EZ CLEAN MEGAD (MISCELLANEOUS) ×3
ELECT REM PT RETURN 9FT ADLT (ELECTROSURGICAL) ×3
ELECTRODE BLDE 4.0 EZ CLN MEGD (MISCELLANEOUS) ×1 IMPLANT
ELECTRODE REM PT RTRN 9FT ADLT (ELECTROSURGICAL) ×1 IMPLANT
FEM STEM 12/14 TAPER SZ 4 HIP (Orthopedic Implant) ×3 IMPLANT
FEMORAL STEM 12/14 TPR SZ4 HIP (Orthopedic Implant) ×1 IMPLANT
GLOVE BIOGEL PI IND STRL 7.0 (GLOVE) ×1 IMPLANT
GLOVE BIOGEL PI INDICATOR 7.0 (GLOVE) ×2
GLOVE ECLIPSE 7.0 STRL STRAW (GLOVE) ×6 IMPLANT
GLOVE SKINSENSE NS SZ7.5 (GLOVE) ×2
GLOVE SKINSENSE STRL SZ7.5 (GLOVE) ×1 IMPLANT
GLOVE SURG SYN 7.5  E (GLOVE) ×8
GLOVE SURG SYN 7.5 E (GLOVE) ×4 IMPLANT
GOWN STRL REIN XL XLG (GOWN DISPOSABLE) ×3 IMPLANT
GOWN STRL REUS W/ TWL LRG LVL3 (GOWN DISPOSABLE) IMPLANT
GOWN STRL REUS W/ TWL XL LVL3 (GOWN DISPOSABLE) ×1 IMPLANT
GOWN STRL REUS W/TWL LRG LVL3 (GOWN DISPOSABLE)
GOWN STRL REUS W/TWL XL LVL3 (GOWN DISPOSABLE) ×2
HANDPIECE INTERPULSE COAX TIP (DISPOSABLE) ×2
HEAD CERAMIC 36 PLUS5 (Hips) ×3 IMPLANT
HOOD PEEL AWAY FLYTE STAYCOOL (MISCELLANEOUS) ×6 IMPLANT
IV NS IRRIG 3000ML ARTHROMATIC (IV SOLUTION) ×3 IMPLANT
KIT BASIN OR (CUSTOM PROCEDURE TRAY) ×3 IMPLANT
LINER NEUTRAL 36ID 54OD (Liner) ×3 IMPLANT
MARKER SKIN DUAL TIP RULER LAB (MISCELLANEOUS) ×3 IMPLANT
NEEDLE SPNL 18GX3.5 QUINCKE PK (NEEDLE) ×3 IMPLANT
PACK TOTAL JOINT (CUSTOM PROCEDURE TRAY) ×3 IMPLANT
PACK UNIVERSAL I (CUSTOM PROCEDURE TRAY) ×3 IMPLANT
RTRCTR WOUND ALEXIS 18CM MED (MISCELLANEOUS)
SAW OSC TIP CART 19.5X105X1.3 (SAW) ×6 IMPLANT
SET HNDPC FAN SPRY TIP SCT (DISPOSABLE) ×1 IMPLANT
STAPLER VISISTAT 35W (STAPLE) IMPLANT
SUT ETHIBOND 2 V 37 (SUTURE) ×3 IMPLANT
SUT MNCRL AB 4-0 PS2 18 (SUTURE) ×3 IMPLANT
SUT VIC AB 0 CT1 27 (SUTURE) ×2
SUT VIC AB 0 CT1 27XBRD ANBCTR (SUTURE) ×1 IMPLANT
SUT VIC AB 1 CTX 36 (SUTURE) ×2
SUT VIC AB 1 CTX36XBRD ANBCTR (SUTURE) ×1 IMPLANT
SUT VIC AB 2-0 CT1 27 (SUTURE) ×4
SUT VIC AB 2-0 CT1 TAPERPNT 27 (SUTURE) ×2 IMPLANT
SYR 50ML LL SCALE MARK (SYRINGE) ×3 IMPLANT
TOWEL GREEN STERILE (TOWEL DISPOSABLE) ×3 IMPLANT
TRAY CATH 16FR W/PLASTIC CATH (SET/KITS/TRAYS/PACK) IMPLANT
YANKAUER SUCT BULB TIP NO VENT (SUCTIONS) ×3 IMPLANT

## 2019-09-25 NOTE — Anesthesia Postprocedure Evaluation (Signed)
Anesthesia Post Note  Patient: Careers information officer  Procedure(s) Performed: RIGHT TOTAL HIP ARTHROPLASTY ANTERIOR APPROACH (Right Hip)     Patient location during evaluation: PACU Anesthesia Type: General Level of consciousness: awake and alert Pain management: pain level controlled Vital Signs Assessment: post-procedure vital signs reviewed and stable Respiratory status: spontaneous breathing, nonlabored ventilation and respiratory function stable Cardiovascular status: blood pressure returned to baseline and stable Postop Assessment: no apparent nausea or vomiting Anesthetic complications: no    Last Vitals:  Vitals:   09/25/19 1312 09/25/19 1342  BP: 130/77 121/80  Pulse: 75 70  Resp: 20   Temp:  (!) 36.3 C  SpO2: 96% 95%    Last Pain:  Vitals:   09/25/19 1342  TempSrc: Oral  PainSc:                  Lynda Rainwater

## 2019-09-25 NOTE — Op Note (Signed)
RIGHT TOTAL HIP ARTHROPLASTY ANTERIOR APPROACH  Procedure Note Yi Haugan   [id number]  Pre-op Diagnosis: right hip avascular necrosis     Post-op Diagnosis: same   Operative Procedures  1. Total hip replacement; Right hip; uncemented [id number]   Personnel  Surgeon(s): Leandrew Koyanagi, MD  Assist: Madalyn Rob, PA-C; necessary for the timely completion of procedure and due to complexity of procedure.   Anesthesia: general  Prosthesis: Depuy Acetabulum: Pinnacle 54 mm Femur: Actis 4 HO Head: 36 mm size: +5 Liner: +0 neutral Bearing Type: ceramic on poly  Total Hip Arthroplasty (Anterior Approach) Op Note:  After informed consent was obtained and the operative extremity marked in the holding area, the patient was brought back to the operating room and placed supine on the HANA table. Next, the operative extremity was prepped and draped in normal sterile fashion. Surgical timeout occurred verifying patient identification, surgical site, surgical procedure and administration of antibiotics.  A modified anterior Smith-Peterson approach to the hip was performed, using the interval between tensor fascia lata and sartorius.  Dissection was carried bluntly down onto the anterior hip capsule. The lateral femoral circumflex vessels were identified and coagulated. A capsulotomy was performed and the capsular flaps tagged for later repair.  Fluoroscopy was utilized to prepare for the femoral neck cut. The neck osteotomy was performed. The femoral head was removed, the acetabular rim was cleared of soft tissue and attention was turned to reaming the acetabulum.  Sequential reaming was performed under fluoroscopic guidance. We reamed to a size 53 mm, and then impacted the acetabular shell. The liner was then placed after irrigation and attention turned to the femur.  After placing the femoral hook, the leg was taken to externally rotated, extended and adducted position taking  care to perform soft tissue releases to allow for adequate mobilization of the femur. Soft tissue was cleared from the shoulder of the greater trochanter and the hook elevator used to improve exposure of the proximal femur. Sequential broaching performed up to a size 4. Trial neck and head were placed. The leg was brought back up to neutral and the construct reduced. The position and sizing of components, offset and leg lengths were checked using fluoroscopy. Stability of the construct was checked in extension and external rotation without any subluxation or impingement of prosthesis. We dislocated the prosthesis, dropped the leg back into position, removed trial components, and irrigated copiously. The final stem and head was then placed, the leg brought back up, the system reduced and fluoroscopy used to verify positioning.  We irrigated, obtained hemostasis and closed the capsule using #2 ethibond suture.  One gram of vancomycin powder was placed in the surgical bed. The fascia was closed with #1 vicryl plus, the deep fat layer was closed with 0 vicryl, the subcutaneous layers closed with 2.0 Vicryl Plus and the skin closed with 3.0 monocryl and steri strips. A sterile dressing was applied. The patient was awakened in the operating room and taken to recovery in stable condition.  All sponge, needle, and instrument counts were correct at the end of the case.   Position: supine  Complications: see description of procedure.  Time Out: performed   Drains/Packing: none  Estimated blood loss: see anesthesia record  Returned to Recovery Room: in good condition.   Antibiotics: yes   Mechanical VTE (DVT) Prophylaxis: sequential compression devices, TED thigh-high  Chemical VTE (DVT) Prophylaxis: aspirin   Fluid Replacement: see anesthesia record  Specimens Removed: 1 to  pathology   Sponge and Instrument Count Correct? yes   PACU: portable radiograph - low AP   Plan/RTC: Return in 2 weeks for  staple removal. Weight Bearing/Load Lower Extremity: full  Hip precautions: none Suture Removal: 2 weeks   N. Glee Arvin, MD Cyndia Skeeters [place] 12:00 PM   Implant Name Type Inv. Item Serial No. Manufacturer Lot No. LRB No. Used Action  PINN ALTRX NEUTRAL IDXOD 32X54 - [id number]  PINN ALTRX NEUTRAL IDXOD 32X54  DEPUY SYNTHES [id number] Right 1 Implanted  ACETABULAR CUP W GRIPTION - [id number] Plate ACETABULAR CUP W GRIPTION  DEPUY SYNTHES [id number] Right 1 Implanted  HEAD CERAMIC 36 PLUS5 - [id number] Hips HEAD CERAMIC 36 PLUS5  DEPUY SYNTHES [id number] Right 1 Implanted  FEM STEM 12/14 TAPER SZ 4 HIP - [id number] Orthopedic Implant FEM STEM 12/14 TAPER SZ 4 HIP  DEPUY ORTH[employer] [id number] Right 1 Implanted

## 2019-09-25 NOTE — Telephone Encounter (Signed)
**Note De-Identified  Obfuscation** Letter received at the office per Dr Elmarie Shiley nurse stating that a PA is required on the pts Effient. I called [facility] pharmacy to get the pts insurance info and was advised that this PA is not needed as the pt picked up 2 bottles of Effient at no charge on 09/13/2019.

## 2019-09-25 NOTE — Care Management (Signed)
CM consult acknowledged to assist with any HH/DME needs. Awaiting PT/OT eval for DCP recommendations and will continue to follow.  Javares Kaufhold RN, BSN, NCM-BC, ACM-RN [phone] 

## 2019-09-25 NOTE — Transfer of Care (Signed)
Immediate Anesthesia Transfer of Care Note  Patient: Rodney Walker  Procedure(s) Performed: RIGHT TOTAL HIP ARTHROPLASTY ANTERIOR APPROACH (Right Hip)  Patient Location: PACU  Anesthesia Type:General  Level of Consciousness: drowsy  Airway & Oxygen Therapy: Patient Spontanous Breathing and Patient connected to nasal cannula oxygen  Post-op Assessment: Report given to RN, Post -op Vital signs reviewed and stable and Patient moving all extremities  Post vital signs: Reviewed and stable  Last Vitals:  Vitals Value Taken Time  BP 133/79 09/25/19 1241  Temp    Pulse 86 09/25/19 1247  Resp 16 09/25/19 1247  SpO2 100 % 09/25/19 1247  Vitals shown include unvalidated device data.  Last Pain:  Vitals:   09/25/19 0924  TempSrc:   PainSc: 0-No pain         Complications: No apparent anesthesia complications

## 2019-09-25 NOTE — Plan of Care (Signed)
  Problem: Pain Managment: Goal: General experience of comfort will improve Outcome: Progressing   Problem: Activity: Goal: Risk for activity intolerance will decrease Outcome: Progressing   

## 2019-09-25 NOTE — Evaluation (Signed)
Physical Therapy Evaluation Patient Details Name: Rodney Walker MRN: [id number] DOB: 11/24/1981 Today's Date: 09/25/2019   History of Present Illness  Pt is a 38 y/o male s/p R THA, direct anterior. PMH includes COPD, CAD s/p stent, HIV, ischemic cardiomyopathy, and MI.   Clinical Impression  Pt is s/p surgery above with deficits below. Pt requiring min guard for mobility using RW this session. Pt with some shakiness, however, reports that happens when he is cold. Per pt, does not have anyone to assist at home and is concerned about going home alone. Will continue to follow acutely to maximize functional mobility independence and safety.     Follow Up Recommendations Follow surgeon's recommendation for DC plan and follow-up therapies    Equipment Recommendations  Rolling walker with 5" wheels;3in1 (PT)    Recommendations for Other Services OT consult     Precautions / Restrictions Precautions Precautions: None Restrictions Weight Bearing Restrictions: Yes RLE Weight Bearing: Weight bearing as tolerated      Mobility  Bed Mobility Overal bed mobility: Needs Assistance Bed Mobility: Supine to Sit     Supine to sit: Min assist     General bed mobility comments: Min A for RLE assist. Increased time to come to EOB.   Transfers Overall transfer level: Needs assistance Equipment used: Rolling walker (2 wheeled) Transfers: Sit to/from Stand Sit to Stand: Min guard         General transfer comment: Min guard for safety. Cues for safe hand placement.   Ambulation/Gait Ambulation/Gait assistance: Min guard Gait Distance (Feet): 5 Feet Assistive device: Rolling walker (2 wheeled) Gait Pattern/deviations: Step-to pattern;Decreased step length - right;Decreased step length - left;Decreased weight shift to right;Antalgic Gait velocity: Decreased   General Gait Details: Slow, antalgic gait. Cues for sequencing using RW. Pt's dinner in room and pt wanting to eat,  so mobiliity limited to chair.   Stairs            Wheelchair Mobility    Modified Rankin (Stroke Patients Only)       Balance Overall balance assessment: Needs assistance Sitting-balance support: No upper extremity supported;Feet supported Sitting balance-Leahy Scale: Good     Standing balance support: Bilateral upper extremity supported;During functional activity Standing balance-Leahy Scale: Poor Standing balance comment: Reliant on BUE support                             Pertinent Vitals/Pain Pain Assessment: 0-10 Pain Score: 2  Pain Location: R hip  Pain Descriptors / Indicators: Aching;Operative site guarding Pain Intervention(s): Limited activity within patient's tolerance;Monitored during session;Repositioned    Home Living Family/patient expects to be discharged to:: Private residence Living Arrangements: Alone Available Help at Discharge: (reports no one available to check on him) Type of Home: House Home Access: Stairs to enter Entrance Stairs-Rails: None Entrance Stairs-Number of Steps: 1(large porch step ) Home Layout: One level Home Equipment: None      Prior Function Level of Independence: Independent               Hand Dominance        Extremity/Trunk Assessment   Upper Extremity Assessment Upper Extremity Assessment: Defer to OT evaluation    Lower Extremity Assessment Lower Extremity Assessment: RLE deficits/detail RLE Deficits / Details: Deficits consistent with post op pain and weakness.     Cervical / Trunk Assessment Cervical / Trunk Assessment: Normal  Communication   Communication: No difficulties  Cognition  Arousal/Alertness: Awake/alert Behavior During Therapy: WFL for tasks assessed/performed Overall Cognitive Status: Within Functional Limits for tasks assessed                                        General Comments      Exercises     Assessment/Plan    PT Assessment Patient  needs continued PT services  PT Problem List Decreased strength;Decreased balance;Decreased range of motion;Decreased mobility;Decreased knowledge of use of DME;Decreased knowledge of precautions;Pain       PT Treatment Interventions Stair training;Gait training;DME instruction;Functional mobility training;Therapeutic activities;Therapeutic exercise;Balance training;Patient/family education    PT Goals (Current goals can be found in the Care Plan section)  Acute Rehab PT Goals Patient Stated Goal: to be independent before going home PT Goal Formulation: With patient Time For Goal Achievement: 10/09/19 Potential to Achieve Goals: Good    Frequency 7X/week   Barriers to discharge Decreased caregiver support      Co-evaluation               AM-PAC PT "6 Clicks" Mobility  Outcome Measure Help needed turning from your back to your side while in a flat bed without using bedrails?: A Little Help needed moving from lying on your back to sitting on the side of a flat bed without using bedrails?: A Little Help needed moving to and from a bed to a chair (including a wheelchair)?: A Little Help needed standing up from a chair using your arms (e.g., wheelchair or bedside chair)?: A Little Help needed to walk in hospital room?: A Little Help needed climbing 3-5 steps with a railing? : A Lot 6 Click Score: 17    End of Session Equipment Utilized During Treatment: Gait belt Activity Tolerance: Patient tolerated treatment well Patient left: in chair;with call bell/phone within reach Nurse Communication: Mobility status PT Visit Diagnosis: Other abnormalities of gait and mobility (R26.89);Pain Pain - Right/Left: Right Pain - part of body: Hip    Time: 3662-9476 PT Time Calculation (min) (ACUTE ONLY): 20 min   Charges:   PT Evaluation $PT Eval Low Complexity: 1 Low          Gladys Damme, PT, DPT  Acute Rehabilitation Services  Pager: [phone] Office: [phone]   Lehman Prom 09/25/2019, 5:41 PM

## 2019-09-25 NOTE — Anesthesia Procedure Notes (Signed)
Procedure Name: Intubation Date/Time: 09/25/2019 10:48 AM Performed by: Kathaleen Dudziak T, CRNA Pre-anesthesia Checklist: Patient identified, Emergency Drugs available, Suction available and Patient being monitored Patient Re-evaluated:Patient Re-evaluated prior to induction Oxygen Delivery Method: Circle system utilized Preoxygenation: Pre-oxygenation with 100% oxygen Induction Type: IV induction Ventilation: Mask ventilation without difficulty Laryngoscope Size: Mac and 4 Grade View: Grade II Tube type: Oral Tube size: 7.5 mm Number of attempts: 1 Airway Equipment and Method: Patient positioned with wedge pillow and Stylet Placement Confirmation: ETT inserted through vocal cords under direct vision,  positive ETCO2 and breath sounds checked- equal and bilateral Secured at: 22 cm Tube secured with: Tape Dental Injury: Teeth and Oropharynx as per pre-operative assessment

## 2019-09-25 NOTE — H&P (Signed)
PREOPERATIVE H&P  Chief Complaint: right hip avascular necrosis  HPI: Rodney Walker is a 38 y.o. male who presents for surgical treatment of right hip avascular necrosis.  He denies any changes in medical history.  Past Medical History:  Diagnosis Date  . Anemia   . COPD (chronic obstructive pulmonary disease) (HCC)   . Coronary artery disease    MI 2010 - dissection/plaque rupture in LAD tx with antiplt/antithrombotics >> relook cath with resolution (no PCI performed) // Inf STEMI 3/19: OM1 30; pRCA 100 >> DES // Echo 3/19:  mild LVH, EF 40-45, inf/inf-sept HK, trivial MR  . Eczema   . History of myocardial infarction 2010; 2019  . History of pleural empyema   . HIV infection (HCC)   . Hyperlipidemia   . Immune deficiency disorder (HCC)   . Ischemic cardiomyopathy 04/05/2018  . Moderate persistent asthma without complication 10/29/2016  . Myocardial infarction (HCC)    2009, 2019  . Pneumonia    Past Surgical History:  Procedure Laterality Date  . CARDIAC CATHETERIZATION  06/24/2009   EF 60%  . CARDIAC CATHETERIZATION  06/17/2009   EF 45%. ANTERIOR HYPOKINESIS  . CORONARY STENT INTERVENTION N/A 03/24/2018   Procedure: CORONARY STENT INTERVENTION;  Surgeon: Tonny Bollman, MD;  Location: [facility] INVASIVE CV LAB;  Service: Cardiovascular;  Laterality: N/A;  . CORONARY/GRAFT ACUTE MI REVASCULARIZATION N/A 03/24/2018   Procedure: Coronary/Graft Acute MI Revascularization;  Surgeon: Tonny Bollman, MD;  Location: [facility] INVASIVE CV LAB;  Service: Cardiovascular;  Laterality: N/A;  . LEFT HEART CATH AND CORONARY ANGIOGRAPHY N/A 03/24/2018   Procedure: LEFT HEART CATH AND CORONARY ANGIOGRAPHY;  Surgeon: Tonny Bollman, MD;  Location: [facility] INVASIVE CV LAB;  Service: Cardiovascular;  Laterality: N/A;  . US ECHOCARDIOGRAPHY  12/09/2009   EF 55-60%  . VIDEO ASSISTED THORACOSCOPY (VATS)/ LOBECTOMY Right 04/23/2015   Procedure: VIDEO ASSISTED THORACOSCOPY (VATS)/RIGHT upper  and right  middle LOBECTOMY;  Surgeon: Kerin Perna, MD;  Location: [facility] OR;  Service: Thoracic;  Laterality: Right;  Marland Kitchen VIDEO BRONCHOSCOPY Bilateral 09/05/2014   Procedure: VIDEO BRONCHOSCOPY WITH FLUORO;  Surgeon: Merwyn Katos, MD;  Location: [facility] ENDOSCOPY;  Service: Cardiopulmonary;  Laterality: Bilateral;  . VIDEO BRONCHOSCOPY Bilateral 04/10/2015   Procedure: VIDEO BRONCHOSCOPY WITH FLUORO;  Surgeon: Lupita Leash, MD;  Location: [facility] ENDOSCOPY;  Service: Cardiopulmonary;  Laterality: Bilateral;   Social History   Socioeconomic History  . Marital status: Single    Spouse name: Not on file  . Number of children: Not on file  . Years of education: Not on file  . Highest education level: Not on file  Occupational History  . Occupation: unemployed  Social Needs  . Financial resource strain: Not on file  . Food insecurity    Worry: Not on file    Inability: Not on file  . Transportation needs    Medical: Not on file    Non-medical: Not on file  Tobacco Use  . Smoking status: Former Smoker    Packs/day: 0.25    Years: 17.00    Pack years: 4.25    Types: Cigarettes, Cigars    Quit date: 03/23/2015    Years since quitting: 4.5  . Smokeless tobacco: Never Used  Substance and Sexual Activity  . Alcohol use: No    Alcohol/week: 0.0 standard drinks  . Drug use: Yes    Frequency: 7.0 times per week    Types: Marijuana  . Sexual activity: Not Currently    Partners:  Male    Comment: given condoms  Lifestyle  . Physical activity    Days per week: Not on file    Minutes per session: Not on file  . Stress: Not on file  Relationships  . Social Herbalist on phone: Not on file    Gets together: Not on file    Attends religious service: Not on file    Active member of club or organization: Not on file    Attends meetings of clubs or organizations: Not on file    Relationship status: Not on file  Other Topics Concern  . Not on file  Social History Narrative  . Not on file    Family History  Problem Relation Age of Onset  . Diabetes Mother   . Eczema Mother   . Asthma Mother   . COPD Mother   . Hypertension Mother   . Hyperlipidemia Mother   . Stroke Mother   . Heart disease Mother   . Sleep apnea Mother   . Liver cancer Father   . Asthma Brother    No Known Allergies Prior to Admission medications   Medication Sig Start Date End Date Taking? Authorizing Provider  aspirin 81 MG EC tablet Take 1 tablet (81 mg total) by mouth daily. 03/26/18  Yes Duke, Tami Lin, PA  bictegravir-emtricitabine-tenofovir AF (BIKTARVY) 50-200-25 MG TABS tablet Take 1 tablet by mouth daily. 03/06/19  Yes Carlyle Basques, MD  cetirizine (ZYRTEC) 10 MG tablet TAKE 1 TABLET(10 MG) BY MOUTH DAILY Patient taking differently: Take 10 mg by mouth daily.  12/15/18  Yes Carlyle Basques, MD  darunavir-cobicistat (PREZCOBIX) 800-150 MG tablet TAKE 1 TABLET BY MOUTH DAILY WITH BREAKFAST. SWALLOW WHOLE. DO NOT CRUSH, BREAK OR CHEW TABLETS.TAKE WITH FOOD Patient taking differently: Take 1 tablet by mouth daily with breakfast.  03/06/19  Yes Carlyle Basques, MD  EFFIENT 10 MG TABS tablet TAKE 1 TABLET BY MOUTH EVERY DAY(FIRST DOSE IS 60MG , BEGINNING SECOND DAY YOU WILL BE ON 10MG  DAILY) Patient taking differently: Take 10 mg by mouth daily.  04/28/19  Yes [name], Wonda Cheng, MD  lisinopril (ZESTRIL) 2.5 MG tablet TAKE 1 TABLET(2.5 MG) BY MOUTH DAILY Patient taking differently: Take 2.5 mg by mouth daily.  04/28/19  Yes [name], Wonda Cheng, MD  metoprolol succinate (TOPROL-XL) 25 MG 24 hr tablet Take 1 tablet (25 mg total) by mouth daily. 11/11/18  Yes [name], Wonda Cheng, MD  nitroGLYCERIN (NITROSTAT) 0.4 MG SL tablet Place 1 tablet (0.4 mg total) under the tongue every 5 (five) minutes x 3 doses as needed for chest pain. 03/29/19  Yes [name], Wonda Cheng, MD  oxyCODONE (OXY IR/ROXICODONE) 5 MG immediate release tablet Take 1 tablet (5 mg total) by mouth every 12 (twelve) hours as needed for up to 30 doses for  breakthrough pain. Patient not taking: Reported on 09/19/2019 11/07/18   Carlyle Basques, MD     Positive ROS: All other systems have been reviewed and were otherwise negative with the exception of those mentioned in the HPI and as above.  Physical Exam: General: Alert, no acute distress Cardiovascular: No pedal edema Respiratory: No cyanosis, no use of accessory musculature GI: abdomen soft Skin: No lesions in the area of chief complaint Neurologic: Sensation intact distally Psychiatric: Patient is competent for consent with normal mood and affect Lymphatic: no lymphedema  MUSCULOSKELETAL: exam stable  Assessment: right hip avascular necrosis  Plan: Plan for Procedure(s): RIGHT TOTAL HIP ARTHROPLASTY ANTERIOR APPROACH  The risks  benefits and alternatives were discussed with the patient including but not limited to the risks of nonoperative treatment, versus surgical intervention including infection, bleeding, nerve injury,  blood clots, cardiopulmonary complications, morbidity, mortality, among others, and they were willing to proceed.   Preoperative templating of the joint replacement has been completed, documented, and submitted to the Operating Room personnel in order to optimize intra-operative equipment management.  Glee Arvin, MD   09/25/2019 7:23 AM

## 2019-09-26 ENCOUNTER — Encounter (HOSPITAL_COMMUNITY): Payer: Self-pay | Admitting: Orthopaedic Surgery

## 2019-09-26 DIAGNOSIS — M879 Osteonecrosis, unspecified: Secondary | ICD-10-CM | POA: Diagnosis not present

## 2019-09-26 LAB — CBC
HCT: 31.2 % — ABNORMAL LOW (ref 39.0–52.0)
Hemoglobin: 11.7 g/dL — ABNORMAL LOW (ref 13.0–17.0)
MCH: 33.8 pg (ref 26.0–34.0)
MCHC: 37.5 g/dL — ABNORMAL HIGH (ref 30.0–36.0)
MCV: 90.2 fL (ref 80.0–100.0)
Platelets: 198 10*3/uL (ref 150–400)
RBC: 3.46 MIL/uL — ABNORMAL LOW (ref 4.22–5.81)
RDW: 12.9 % (ref 11.5–15.5)
WBC: 14.4 10*3/uL — ABNORMAL HIGH (ref 4.0–10.5)
nRBC: 0 % (ref 0.0–0.2)

## 2019-09-26 LAB — BASIC METABOLIC PANEL
Anion gap: 9 (ref 5–15)
BUN: 18 mg/dL (ref 6–20)
CO2: 23 mmol/L (ref 22–32)
Calcium: 8.3 mg/dL — ABNORMAL LOW (ref 8.9–10.3)
Chloride: 104 mmol/L (ref 98–111)
Creatinine, Ser: 1.36 mg/dL — ABNORMAL HIGH (ref 0.61–1.24)
GFR calc Af Amer: >60 mL/min (ref >60)
GFR calc non Af Amer: >60 mL/min (ref >60)
Glucose, Bld: 126 mg/dL — ABNORMAL HIGH (ref 70–99)
Potassium: 3.8 mmol/L (ref 3.5–5.1)
Sodium: 136 mmol/L (ref 135–145)

## 2019-09-26 NOTE — Progress Notes (Signed)
Foley removed without incident.  Pt due to void by 11:11am today. Pt verbalized understanding to make medical staff aware of first void after foley removal. Urinal placed at bedside. Phone and call light in reach. Ice pack placed to R hip. Surgical dressing dry and intact. Pt resting comfortably, respirations even and unlabored.

## 2019-09-26 NOTE — Progress Notes (Signed)
Physical Therapy Treatment Patient Details Name: Rodney Walker MRN: [id number] DOB: [birth year] Today's Date: 09/26/2019    History of Present Illness Pt is a 38 y/o male s/p R THA, direct anterior. PMH includes COPD, CAD s/p stent, HIV, ischemic cardiomyopathy, and MI.     PT Comments    Pt performed gt training and functional mobility with little to no assistance. Largely patient required cues for safety but is mobilizing well.  He feels confident to return home today.  Will f/u in pm for stair training and progression to standing exercises.  Informed nurse and ortho PA that he feels ready for d/c today.     Follow Up Recommendations  Follow surgeon's recommendation for DC plan and follow-up therapies     Equipment Recommendations  Rolling walker with 5" wheels;3in1 (PT)    Recommendations for Other Services       Precautions / Restrictions Precautions Precautions: None Restrictions Weight Bearing Restrictions: Yes RLE Weight Bearing: Weight bearing as tolerated    Mobility  Bed Mobility Overal bed mobility: Needs Assistance;Modified Independent Bed Mobility: Supine to Sit     Supine to sit: Modified independent (Device/Increase time)     General bed mobility comments: No assistance needed  Transfers Overall transfer level: Needs assistance Equipment used: Rolling walker (2 wheeled) Transfers: Sit to/from Stand Sit to Stand: Supervision         General transfer comment: Supervision for hand placement as he attempted to pull on RW to achieve standing.  Ambulation/Gait Ambulation/Gait assistance: Supervision Gait Distance (Feet): 450 Feet Assistive device: Rolling walker (2 wheeled) Gait Pattern/deviations: Decreased stride length;Step-through pattern;Trunk flexed     General Gait Details: Pt required cues for forward gaze and upper trunk control, intermittent short distances in room without device.  He tolerated this well.   Stairs             Wheelchair Mobility    Modified Rankin (Stroke Patients Only)       Balance Overall balance assessment: Needs assistance   Sitting balance-Leahy Scale: Normal       Standing balance-Leahy Scale: Good Standing balance comment: intermittent use of BUE support                            Cognition Arousal/Alertness: Awake/alert Behavior During Therapy: WFL for tasks assessed/performed Overall Cognitive Status: Within Functional Limits for tasks assessed                                        Exercises Total Joint Exercises Ankle Circles/Pumps: AROM;Both;20 reps;Supine Quad Sets: AROM;Right;10 reps;Supine Heel Slides: AROM;Right;10 reps;Supine Hip ABduction/ADduction: AROM;Right;10 reps;Supine    General Comments        Pertinent Vitals/Pain Pain Assessment: 0-10 Pain Score: 2  Pain Location: R hip  Pain Descriptors / Indicators: Aching;Operative site guarding Pain Intervention(s): Monitored during session;Repositioned    Home Living                      Prior Function            PT Goals (current goals can now be found in the care plan section) Acute Rehab PT Goals Patient Stated Goal: to be independent before going home Potential to Achieve Goals: Good Progress towards PT goals: Progressing toward goals    Frequency    7X/week  PT Plan Current plan remains appropriate    Co-evaluation              AM-PAC PT "6 Clicks" Mobility   Outcome Measure  Help needed turning from your back to your side while in a flat bed without using bedrails?: A Little Help needed moving from lying on your back to sitting on the side of a flat bed without using bedrails?: A Little Help needed moving to and from a bed to a chair (including a wheelchair)?: A Little Help needed standing up from a chair using your arms (e.g., wheelchair or bedside chair)?: A Little Help needed to walk in hospital room?: A Little Help  needed climbing 3-5 steps with a railing? : A Lot 6 Click Score: 17    End of Session Equipment Utilized During Treatment: Gait belt Activity Tolerance: Patient tolerated treatment well Patient left: in chair;with call bell/phone within reach Nurse Communication: Mobility status PT Visit Diagnosis: Other abnormalities of gait and mobility (R26.89);Pain Pain - Right/Left: Right Pain - part of body: Hip     Time: 2725-3664 PT Time Calculation (min) (ACUTE ONLY): 30 min  Charges:  $Gait Training: 8-22 mins $Therapeutic Exercise: 8-22 mins                     Governor Rooks, PTA Acute Rehabilitation Services Pager [phone] Office [phone]     Rodney Walker [name] 09/26/2019, 11:29 AM

## 2019-09-26 NOTE — TOC Transition Note (Signed)
Transition of Care [facility]) - CM/SW Discharge Note   Patient Details  Name: Rodney Walker MRN: [id number] Date of Birth: 11/03/1981  Transition of Care [facility] [place]) CM/SW Contact:  Midge Minium RN, BSN, NCM-BC, ACM-RN [phone] Phone Number: 09/26/2019, 12:06 PM   Clinical Narrative:    CM following for dispositional needs. CM spoke to the patient to discuss CM role and POC. Patient states living at home and being independent with his ADLs PTA. Demo verified; patient can't remember his PCP name. Patient is s/p R THA with PT eval complete; BSC/RW recommended. HHPT was prearranged by Ortho PTA with the patient agreeable to the [facility] services. Patient stated having transportation home. DME referral given to [place]; ([facility] liaison); AVS updated. No further needs from CM.    Final next level of care: [place] Barriers to Discharge: No Barriers Identified   Patient Goals and CMS Choice Patient states their goals for this hospitalization and ongoing recovery are:: "to recover" CMS Medicare.gov Compare Post Acute Care list provided to:: Patient Choice offered to / list presented to : Patient   Discharge Plan and Services                DME Arranged: Bedside commode, Walker rolling DME Agency: [facility] Date DME Agency Contacted: 09/26/19 Time DME Agency Contacted: 1205 Representative spoke with at DME Agency: [place]: PT [place]: [facility] (formerly [employer]) Date [place]: 09/26/19 Time [place]: 1206 Representative spoke with at [place]: [place]  Social Determinants of Health ([place]) Interventions     Readmission Risk Interventions No flowsheet data found.

## 2019-09-26 NOTE — Discharge Summary (Signed)
Patient ID: Rodney Walker MRN: [id number] DOB/AGE: [birth year] 38 y.o.  Admit date: 09/25/2019 Discharge date: 09/26/2019  Admission Diagnoses:  Principal Problem:   Avascular necrosis of bone of right hip [facility]) Active Problems:   Status post total replacement of right hip   Discharge Diagnoses:  Same  Past Medical History:  Diagnosis Date  . Anemia   . COPD (chronic obstructive pulmonary disease) ([place])   . Coronary artery disease    MI 2010 - dissection/plaque rupture in LAD tx with antiplt/antithrombotics >> relook cath with resolution (no PCI performed) // Inf STEMI 3/19: OM1 30; pRCA 100 >> DES // Echo 3/19:  mild LVH, EF 40-45, inf/inf-sept HK, trivial MR  . Eczema   . History of myocardial infarction 2010; 2019  . History of pleural empyema   . HIV infection ([place])   . Hyperlipidemia   . Immune deficiency disorder ([place])   . Ischemic cardiomyopathy 04/05/2018  . Moderate persistent asthma without complication 96/01/9527  . Myocardial infarction ([place])    2009, 2019  . Pneumonia     Surgeries: Procedure(s): RIGHT TOTAL HIP ARTHROPLASTY ANTERIOR APPROACH on 09/25/2019   Consultants:   Discharged Condition: Improved  Hospital Course: Rodney Walker is an 38 y.o. male who was admitted 09/25/2019 for operative treatment ofAvascular necrosis of bone of right hip ([place]). Patient has severe unremitting pain that affects sleep, daily activities, and work/hobbies. After pre-op clearance the patient was taken to the operating room on 09/25/2019 and underwent  Procedure(s): RIGHT TOTAL HIP ARTHROPLASTY ANTERIOR APPROACH.    Patient was given perioperative antibiotics:  Anti-infectives (From admission, onward)   Start     Dose/Rate Route Frequency Ordered Stop   09/26/19 1000  bictegravir-emtricitabine-tenofovir AF (BIKTARVY) 50-200-25 MG per tablet 1 tablet     1 tablet Oral Daily 09/25/19 1344     09/26/19 0800  darunavir-cobicistat (PREZCOBIX) 800-150 MG per  tablet 1 tablet    Note to Pharmacy: TAKE 1 TABLET BY MOUTH DAILY WITH BREAKFAST. SWALLOW WHOLE. DO NOT CRUSH, BREAK OR CHEW TABLETS.TAKE WITH FOOD     1 tablet Oral Daily with breakfast 09/25/19 1344     09/25/19 1630  ceFAZolin (ANCEF) IVPB 2g/100 mL premix     2 g 200 mL/hr over 30 Minutes Intravenous Every 6 hours 09/25/19 1344 09/26/19 0506   09/25/19 1111  vancomycin (VANCOCIN) powder  Status:  Discontinued       As needed 09/25/19 1111 09/25/19 1236   09/25/19 0915  ceFAZolin (ANCEF) IVPB 2g/100 mL premix     2 g 200 mL/hr over 30 Minutes Intravenous On call to O.R. 09/25/19 0910 09/25/19 1054       Patient was given sequential compression devices, early ambulation, and chemoprophylaxis to prevent DVT.  Patient benefited maximally from hospital stay and there were no complications.    Recent vital signs:  Patient Vitals for the past 24 hrs:  BP Temp Temp src Pulse Resp SpO2  09/26/19 0757 111/71 98 F (36.7 C) Oral 71 16 95 %  09/26/19 0406 (!) 109/57 98.7 F (37.1 C) Oral 82 18 93 %  09/25/19 2033 106/62 97.9 F (36.6 C) Oral 66 17 96 %  09/25/19 1625 111/71 97.6 F (36.4 C) Oral 67 18 100 %  09/25/19 1342 121/80 (!) 97.4 F (36.3 C) Oral 70 - 95 %  09/25/19 1312 130/77 - - 75 20 96 %  09/25/19 1257 (!) 124/91 - - 86 (!) 21 99 %  09/25/19 1242 133/79 97.8  F (36.6 C) - 79 (!) 24 99 %     Recent laboratory studies:  Recent Labs    09/26/19 0422  WBC 14.4*  HGB 11.7*  HCT 31.2*  PLT 198  NA 136  K 3.8  CL 104  CO2 23  BUN 18  CREATININE 1.36*  GLUCOSE 126*  CALCIUM 8.3*     Discharge Medications:   Allergies as of 09/26/2019   No Known Allergies     Medication List    TAKE these medications   aspirin EC 81 MG tablet Take 1 tablet (81 mg total) by mouth 2 (two) times daily. What changed: when to take this   Biktarvy 50-200-25 MG Tabs tablet Generic drug: bictegravir-emtricitabine-tenofovir AF TAKE 1 TABLET BY MOUTH DAILY   cetirizine 10 MG  tablet Commonly known as: ZYRTEC TAKE 1 TABLET(10 MG) BY MOUTH DAILY What changed: See the new instructions.   darunavir-cobicistat 800-150 MG tablet Commonly known as: Prezcobix TAKE 1 TABLET BY MOUTH DAILY WITH BREAKFAST. SWALLOW WHOLE. DO NOT CRUSH, BREAK OR CHEW TABLETS.TAKE WITH FOOD What changed:   how much to take  how to take this  when to take this  additional instructions   Effient 10 MG Tabs tablet Generic drug: prasugrel TAKE 1 TABLET BY MOUTH EVERY DAY(FIRST DOSE IS 60MG , BEGINNING SECOND DAY YOU WILL BE ON 10MG  DAILY) What changed: See the new instructions.   lisinopril 2.5 MG tablet Commonly known as: ZESTRIL TAKE 1 TABLET(2.5 MG) BY MOUTH DAILY What changed: See the new instructions.   methocarbamol 750 MG tablet Commonly known as: ROBAXIN Take 1 tablet (750 mg total) by mouth 2 (two) times daily as needed for muscle spasms.   metoprolol succinate 25 MG 24 hr tablet Commonly known as: TOPROL-XL Take 1 tablet (25 mg total) by mouth daily.   nitroGLYCERIN 0.4 MG SL tablet Commonly known as: NITROSTAT Place 1 tablet (0.4 mg total) under the tongue every 5 (five) minutes x 3 doses as needed for chest pain.   ondansetron 4 MG tablet Commonly known as: ZOFRAN Take 1-2 tablets (4-8 mg total) by mouth every 8 (eight) hours as needed for nausea or vomiting.   oxyCODONE 5 MG immediate release tablet Commonly known as: Oxy IR/ROXICODONE Take 1-2 tablets (5-10 mg total) by mouth every 8 (eight) hours as needed for severe pain. What changed:   how much to take  when to take this  reasons to take this   oxyCODONE 10 mg 12 hr tablet Commonly known as: OXYCONTIN Take 1 tablet (10 mg total) by mouth every 12 (twelve) hours for 3 days. What changed: You were already taking a medication with the same name, and this prescription was added. Make sure you understand how and when to take each.   promethazine 25 MG tablet Commonly known as: PHENERGAN Take 1  tablet (25 mg total) by mouth every 6 (six) hours as needed for nausea.   senna-docusate 8.6-50 MG tablet Commonly known as: Senokot S Take 1-2 tablets by mouth at bedtime as needed.            Durable Medical Equipment  (From admission, onward)         Start     Ordered   09/25/19 1345  DME Walker rolling  Once    Question:  Patient needs a walker to treat with the following condition  Answer:  History of hip replacement   09/25/19 1344   09/25/19 1345  DME 3 n 1  Once  09/25/19 1344   09/25/19 1345  DME Bedside commode  Once    Question:  Patient needs a bedside commode to treat with the following condition  Answer:  History of hip replacement   09/25/19 1344          Diagnostic Studies: Dg Chest 2 View  Result Date: 09/19/2019 CLINICAL DATA:  Pre-admit for right hip arthroplasty. EXAM: CHEST - 2 VIEW COMPARISON:  Radiographs of March 24, 2018. FINDINGS: The heart size and mediastinal contours are within normal limits. Emphysematous disease is noted bilaterally. No definite pneumothorax is noted. Postsurgical changes and scarring are noted in the right perihilar and midlung regions. Left lung is unremarkable. Right basilar atelectasis or scarring is noted with possible small pleural effusion. The visualized skeletal structures are unremarkable. IMPRESSION: Postsurgical changes and scarring are noted in the right perihilar mid lung region. Probable scarring or atelectasis is noted in the right lung base, with possible small right pleural effusion. Emphysema (ICD10-J43.9). Electronically Signed   By: Lupita Raider M.D.   On: 09/19/2019 12:51   Dg Pelvis Portable  Result Date: 09/25/2019 CLINICAL DATA:  Status post right hip arthroplasty EXAM: PORTABLE PELVIS 1-2 VIEWS COMPARISON:  None. FINDINGS: Post total right hip arthroplasty the hardware appears well seated and intact. No new acute finding in the bony pelvis or left hip. Expected postoperative changes in the soft tissues  of the lateral right thigh. IMPRESSION: Status post right hip arthroplasty without evidence for immediate hardware complication. Electronically Signed   By: Emmaline Kluver M.D.   On: 09/25/2019 13:15   Dg C-arm 1-60 Min  Result Date: 09/25/2019 CLINICAL DATA:  AVN EXAM: OPERATIVE RIGHT HIP WITH PELVIS; DG C-ARM 1-60 MIN COMPARISON:  07/04/2019 FLUOROSCOPY TIME:  Fluoroscopy Time:  34 seconds Radiation Exposure Index (if provided by the fluoroscopic device): Not available Number of Acquired Spot Images: 2 FINDINGS: Right hip replacement is now seen. Changes of a vascular necrosis are again noted within the left femoral head. No other focal abnormality is noted. IMPRESSION: Status post right hip replacement. Electronically Signed   By: Alcide Clever M.D.   On: 09/25/2019 12:10   Dg Hip Operative Unilat W Or W/o Pelvis Right  Result Date: 09/25/2019 CLINICAL DATA:  AVN EXAM: OPERATIVE RIGHT HIP WITH PELVIS; DG C-ARM 1-60 MIN COMPARISON:  07/04/2019 FLUOROSCOPY TIME:  Fluoroscopy Time:  34 seconds Radiation Exposure Index (if provided by the fluoroscopic device): Not available Number of Acquired Spot Images: 2 FINDINGS: Right hip replacement is now seen. Changes of a vascular necrosis are again noted within the left femoral head. No other focal abnormality is noted. IMPRESSION: Status post right hip replacement. Electronically Signed   By: Alcide Clever M.D.   On: 09/25/2019 12:10    Disposition: Discharge disposition: 01-Home or Self Care         Follow-up Information    Tarry Kos, MD. Schedule an appointment as soon as possible for a visit in 2 day(s).   Specialty: Orthopedic Surgery Contact information: [address] [place] [address] [phone]            Signed: Cristie Hem 09/26/2019, 11:22 AM

## 2019-09-26 NOTE — Progress Notes (Signed)
Subjective: 1 Day Post-Op Procedure(s) (LRB): RIGHT TOTAL HIP ARTHROPLASTY ANTERIOR APPROACH (Right) Patient reports pain as mild.  Doing well this am.    Objective: Vital signs in last 24 hours: Temp:  [97.4 F (36.3 C)-98.7 F (37.1 C)] 98.7 F (37.1 C) (09/29 0406) Pulse Rate:  [66-86] 82 (09/29 0406) Resp:  [17-24] 18 (09/29 0406) BP: (106-133)/(57-91) 109/57 (09/29 0406) SpO2:  [93 %-100 %] 93 % (09/29 0406) Weight:  [61.2 kg] 61.2 kg (09/28 0912)  Intake/Output from previous day: 09/28 0701 - 09/29 0700 In: 3173.5 [P.O.:720; I.V.:2174.2; IV Piggyback:279.3] Out: 2050 [Urine:1750; Blood:300] Intake/Output this shift: No intake/output data recorded.  Recent Labs    09/26/19 0422  HGB 11.7*   Recent Labs    09/26/19 0422  WBC 14.4*  RBC 3.46*  HCT 31.2*  PLT 198   Recent Labs    09/26/19 0422  NA 136  K 3.8  CL 104  CO2 23  BUN 18  CREATININE 1.36*  GLUCOSE 126*  CALCIUM 8.3*   No results for input(s): LABPT, INR in the last 72 hours.  Neurologically intact Neurovascular intact Sensation intact distally Intact pulses distally Dorsiflexion/Plantar flexion intact Incision: dressing C/D/I No cellulitis present   Assessment/Plan: 1 Day Post-Op Procedure(s) (LRB): RIGHT TOTAL HIP ARTHROPLASTY ANTERIOR APPROACH (Right) Advance diet Up with therapy D/C IV fluids  WBAT RLE ABLA- mild and stable Continue ice packs to right hip around the clock Will need hhpt vs snf at d/c as he lives alone      Aundra Dubin 09/26/2019, 7:57 AM

## 2019-09-26 NOTE — Discharge Instructions (Signed)

## 2019-09-26 NOTE — Evaluation (Signed)
Occupational Therapy Evaluation Patient Details Name: Rodney Walker MRN: [id number] DOB: 11-21-1981 Today's Date: 09/26/2019    History of Present Illness Pt is a 38 y/o male s/p R THA, direct anterior. PMH includes COPD, CAD s/p stent, HIV, ischemic cardiomyopathy, and MI.    Clinical Impression   Pt is mod I with functional mobility using RW, independent with UB ADLs, grooming, toileting and min guard A with LB ADLs. Pt educated on DME and A/E for LB ADLs as well as compensatory techniques for LB ADLs. Pt to d/c home today and no further acute OT is indicated at this time, OT will sign off    Follow Up Recommendations  Home health OT    Equipment Recommendations  3 in 1 bedside commode;Tub/shower bench;Other (comment)(reacher)    Recommendations for Other Services       Precautions / Restrictions Precautions Precautions: None Restrictions Weight Bearing Restrictions: Yes RLE Weight Bearing: Weight bearing as tolerated      Mobility Bed Mobility Overal bed mobility: Needs Assistance;Modified Independent Bed Mobility: Supine to Sit     Supine to sit: Modified independent (Device/Increase time)     General bed mobility comments: Pt seated in recliner on arrival,  Transfers Overall transfer level: Needs assistance Equipment used: Rolling walker (2 wheeled) Transfers: Sit to/from Stand Sit to Stand: Modified independent (Device/Increase time)         General transfer comment: MOD I with and without device.    Balance Overall balance assessment: Needs assistance Sitting-balance support: No upper extremity supported;Feet supported Sitting balance-Leahy Scale: Normal     Standing balance support: Bilateral upper extremity supported;During functional activity Standing balance-Leahy Scale: Good Standing balance comment: intermittent use of BUE support                           ADL either performed or assessed with clinical judgement    ADL Overall ADL's : Modified independent                                             Vision Patient Visual Report: No change from baseline       Perception     Praxis      Pertinent Vitals/Pain Pain Assessment: 0-10 Pain Score: 3  Pain Location: R hip  Pain Descriptors / Indicators: Aching;Operative site guarding Pain Intervention(s): Monitored during session;Repositioned     Hand Dominance Right   Extremity/Trunk Assessment Upper Extremity Assessment Upper Extremity Assessment: Defer to OT evaluation   Lower Extremity Assessment Lower Extremity Assessment: Defer to PT evaluation   Cervical / Trunk Assessment Cervical / Trunk Assessment: Normal   Communication Communication Communication: No difficulties   Cognition Arousal/Alertness: Awake/alert Behavior During Therapy: WFL for tasks assessed/performed Overall Cognitive Status: Within Functional Limits for tasks assessed                                     General Comments       Exercises Total Joint Exercises Ankle Circles/Pumps: AROM;Both;20 reps;Supine Quad Sets: AROM;Right;10 reps;Supine Heel Slides: AROM;Right;10 reps;Supine Hip ABduction/ADduction: AROM;Right;10 reps;Standing Long Arc Quad: AROM;Right;10 reps;Seated Knee Flexion: AROM;Right;10 reps;Standing Marching in Standing: AROM;Right;10 reps;Standing Standing Hip Extension: AROM;Right;10 reps;Standing   Shoulder Instructions      Home Living Family/patient expects  to be discharged to:: Private residence Living Arrangements: Alone Available Help at Discharge: Other (Comment)(none) Type of Home: House Home Access: Stairs to enter [employer] of Steps: 1 Entrance Stairs-Rails: None Home Layout: One level     Bathroom Shower/Tub: Teacher, early years/pre: Standard     Home Equipment: None          Prior Functioning/Environment Level of Independence: Independent                  OT Problem List: Impaired balance (sitting and/or standing);Decreased activity tolerance;Decreased knowledge of use of DME or AE;Pain      OT Treatment/Interventions:      OT Goals(Current goals can be found in the care plan section) Acute Rehab OT Goals Patient Stated Goal: to be independent before going home OT Goal Formulation: With patient  OT Frequency:     Barriers to D/C:            Co-evaluation              AM-PAC OT "6 Clicks" Daily Activity     Outcome Measure Help from another person eating meals?: None Help from another person taking care of personal grooming?: None Help from another person toileting, which includes using toliet, bedpan, or urinal?: None Help from another person bathing (including washing, rinsing, drying)?: A Little Help from another person to put on and taking off regular upper body clothing?: None Help from another person to put on and taking off regular lower body clothing?: A Little 6 Click Score: 22   End of Session Equipment Utilized During Treatment: Rolling walker  Activity Tolerance: Patient tolerated treatment well Patient left: in chair  OT Visit Diagnosis: Other abnormalities of gait and mobility (R26.89);Pain Pain - Right/Left: Right Pain - part of body: Hip                Time: 1749-4496 OT Time Calculation (min): 19 min Charges:  OT General Charges $OT Visit: 1 Visit OT Evaluation $OT Eval Low Complexity: 1 Low OT Treatments $Self Care/Home Management : 8-22 mins    Britt Bottom 09/26/2019, 2:12 PM

## 2019-09-26 NOTE — Progress Notes (Addendum)
Physical Therapy Treatment Patient Details Name: Rodney Walker MRN: [id number] DOB: 07-15-1981 Today's Date: 09/26/2019    History of Present Illness Pt is a 38 y/o male s/p R THA, direct anterior. PMH includes COPD, CAD s/p stent, HIV, ischemic cardiomyopathy, and MI.     PT Comments    Pt performed gt training and functional mobility without device this pm.  He is more antalgic but functioning well.  He perfrormed stair training with safe technique and is ready to d/c home.  Plan for HHPT remains appropriate.    Follow Up Recommendations  Follow surgeon's recommendation for DC plan and follow-up therapies     Equipment Recommendations  Rolling walker with 5" wheels;3in1 (PT)    Recommendations for Other Services       Precautions / Restrictions Precautions Precautions: None Restrictions Weight Bearing Restrictions: Yes RLE Weight Bearing: Weight bearing as tolerated    Mobility  Bed Mobility Overal bed mobility: Needs Assistance;Modified Independent Bed Mobility: Supine to Sit     Supine to sit: Modified independent (Device/Increase time)     General bed mobility comments: Pt seated in recliner on arrival,  Transfers Overall transfer level: Needs assistance Equipment used: Rolling walker (2 wheeled) Transfers: Sit to/from Stand Sit to Stand: Modified independent (Device/Increase time)         General transfer comment: MOD I with and without device.  Ambulation/Gait Ambulation/Gait assistance: Supervision Gait Distance (Feet): 200 Feet Assistive device: None Gait Pattern/deviations: Decreased stride length;Step-through pattern;Trunk flexed;Antalgic     General Gait Details: Pt with more antalgic pattern without device but no LOB or instability noted.   Stairs Stairs: Yes Stairs assistance: Supervision Stair Management: No rails;Two rails Number of Stairs: 4 General stair comments: x2 with B rails and x2 without rails.  Pt required  supervision for safety.   Wheelchair Mobility    Modified Rankin (Stroke Patients Only)       Balance Overall balance assessment: Needs assistance Sitting-balance support: No upper extremity supported;Feet supported Sitting balance-Leahy Scale: Normal     Standing balance support: Bilateral upper extremity supported;During functional activity Standing balance-Leahy Scale: Good Standing balance comment: intermittent use of BUE support                            Cognition Arousal/Alertness: Awake/alert Behavior During Therapy: WFL for tasks assessed/performed Overall Cognitive Status: Within Functional Limits for tasks assessed                                        Exercises Total Joint Exercises  Hip ABduction/ADduction: AROM;Right;10 reps;Standing Long Arc Quad: AROM;Right;10 reps;Seated Knee Flexion: AROM;Right;10 reps;Standing Marching in Standing: AROM;Right;10 reps;Standing Standing Hip Extension: AROM;Right;10 reps;Standing    General Comments        Pertinent Vitals/Pain Pain Assessment: 0-10 Pain Score: 2  Pain Location: R hip  Pain Descriptors / Indicators: Aching;Operative site guarding Pain Intervention(s): Monitored during session;Repositioned    Home Living                      Prior Function            PT Goals (current goals can now be found in the care plan section) Acute Rehab PT Goals Patient Stated Goal: to be independent before going home Potential to Achieve Goals: Good Progress towards PT goals: Progressing toward goals  Frequency    7X/week      PT Plan Current plan remains appropriate    Co-evaluation              AM-PAC PT "6 Clicks" Mobility   Outcome Measure  Help needed turning from your back to your side while in a flat bed without using bedrails?: None Help needed moving from lying on your back to sitting on the side of a flat bed without using bedrails?: None Help  needed moving to and from a bed to a chair (including a wheelchair)?: None Help needed standing up from a chair using your arms (e.g., wheelchair or bedside chair)?: None Help needed to walk in hospital room?: None Help needed climbing 3-5 steps with a railing? : A Little 6 Click Score: 23    End of Session Equipment Utilized During Treatment: Gait belt Activity Tolerance: Patient tolerated treatment well Patient left: in chair;with call bell/phone within reach Nurse Communication: Mobility status PT Visit Diagnosis: Other abnormalities of gait and mobility (R26.89);Pain Pain - Right/Left: Right Pain - part of body: Hip     Time: 4098-1191 PT Time Calculation (min) (ACUTE ONLY): 12 min  Charges:  $Gait Training: 8-22 mins                      Joycelyn Rua, PTA Acute Rehabilitation Services Pager [phone] Office [phone]     Meta Kroenke Artis Delay 09/26/2019, 2:06 PM

## 2019-10-02 ENCOUNTER — Ambulatory Visit (INDEPENDENT_AMBULATORY_CARE_PROVIDER_SITE_OTHER): Payer: Medicare Other | Admitting: Internal Medicine

## 2019-10-02 ENCOUNTER — Telehealth: Payer: Self-pay | Admitting: Orthopaedic Surgery

## 2019-10-02 ENCOUNTER — Other Ambulatory Visit: Payer: Self-pay

## 2019-10-02 ENCOUNTER — Other Ambulatory Visit: Payer: Self-pay | Admitting: Internal Medicine

## 2019-10-02 ENCOUNTER — Encounter: Payer: Self-pay | Admitting: Internal Medicine

## 2019-10-02 VITALS — BP 119/70 | HR 76 | Temp 98.0°F

## 2019-10-02 DIAGNOSIS — B20 Human immunodeficiency virus [HIV] disease: Secondary | ICD-10-CM

## 2019-10-02 DIAGNOSIS — Z23 Encounter for immunization: Secondary | ICD-10-CM | POA: Diagnosis not present

## 2019-10-02 DIAGNOSIS — N179 Acute kidney failure, unspecified: Secondary | ICD-10-CM

## 2019-10-02 MED ORDER — PREZCOBIX 800-150 MG PO TABS: ORAL_TABLET | ORAL | 5 refills | Status: DC

## 2019-10-02 NOTE — Telephone Encounter (Signed)
[name] with [facility] called in requesting verbal orders for home health pt for 1 time a week for 1 week and 3 times a week for 2 weeks.    [phone]

## 2019-10-02 NOTE — Progress Notes (Signed)
Called [facility] to transfer [employer] and Prezcobix from [facility] and authorized 5 refills on both medication. Request by Dr. Baxter Flattery. Patient agrees.  Rodney Walker

## 2019-10-02 NOTE — Telephone Encounter (Signed)
Called [name] back to approve verbal orders. No answer LMOM with details.Marland Kitchen

## 2019-10-02 NOTE — Progress Notes (Signed)
RFV: follow up with hiv disease  Patient ID: Rodney Walker, male   DOB: 11-17-1981, 38 y.o.   MRN: [id number]  HPI  Rodney Walker is a 38yo F well controlled hiv disease, CD 4 count 594/VL<20 in June 2020 of biktarvy-DRVc . He reports that he has had difficulty getting access to his medications getting synchronized to be filled at the same time. He did have 2 days without biktarvy( and still took prezcobix-on its own). Other than medication issues, he is doing well. In looking at his recent labs, it shows that Wbc and cr elevated this past week.  He denies any recent flu like illness, wears masks in public    Outpatient Encounter Medications as of 10/02/2019  Medication Sig  . aspirin EC 81 MG tablet Take 1 tablet (81 mg total) by mouth 2 (two) times daily.  Marland Kitchen BIKTARVY 50-200-25 MG TABS tablet TAKE 1 TABLET BY MOUTH DAILY  . cetirizine (ZYRTEC) 10 MG tablet TAKE 1 TABLET BY MOUTH EVERY DAY  . darunavir-cobicistat (PREZCOBIX) 800-150 MG tablet TAKE 1 TABLET BY MOUTH DAILY WITH BREAKFAST. SWALLOW WHOLE. DO NOT CRUSH, BREAK OR CHEW TABLETS.TAKE WITH FOOD (Patient taking differently: Take 1 tablet by mouth daily with breakfast. )  . EFFIENT 10 MG TABS tablet TAKE 1 TABLET BY MOUTH EVERY DAY(FIRST DOSE IS 60MG , BEGINNING SECOND DAY YOU WILL BE ON 10MG  DAILY) (Patient taking differently: Take 10 mg by mouth daily. )  . lisinopril (ZESTRIL) 2.5 MG tablet TAKE 1 TABLET(2.5 MG) BY MOUTH DAILY (Patient taking differently: Take 2.5 mg by mouth daily. )  . methocarbamol (ROBAXIN) 750 MG tablet Take 1 tablet (750 mg total) by mouth 2 (two) times daily as needed for muscle spasms.  . metoprolol succinate (TOPROL-XL) 25 MG 24 hr tablet Take 1 tablet (25 mg total) by mouth daily.  . nitroGLYCERIN (NITROSTAT) 0.4 MG SL tablet Place 1 tablet (0.4 mg total) under the tongue every 5 (five) minutes x 3 doses as needed for chest pain.  Marland Kitchen ondansetron (ZOFRAN) 4 MG tablet Take 1-2 tablets (4-8 mg total) by  mouth every 8 (eight) hours as needed for nausea or vomiting.  Marland Kitchen oxyCODONE (OXY IR/ROXICODONE) 5 MG immediate release tablet Take 1-2 tablets (5-10 mg total) by mouth every 8 (eight) hours as needed for severe pain.  . promethazine (PHENERGAN) 25 MG tablet Take 1 tablet (25 mg total) by mouth every 6 (six) hours as needed for nausea.  Marland Kitchen senna-docusate (SENOKOT S) 8.6-50 MG tablet Take 1-2 tablets by mouth at bedtime as needed.   No facility-administered encounter medications on file as of 10/02/2019.      Patient Active Problem List   Diagnosis Date Noted  . Avascular necrosis of bone of right hip ([place]) 09/25/2019  . 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 Hyperlipidemia 04/05/2018  . Ischemic cardiomyopathy 04/05/2018  . History of acute inferior wall MI 03/24/2018  . Marijuana abuse, continuous 10/29/2016  . Moderate persistent asthma without complication 40/97/3532  . Allergic rhinitis 10/08/2016  . Anemia 05/03/2015  . Lung abscess ([place]) 04/22/2015  . Sepsis due to pneumonia ([place]) 04/17/2015  . Bullous emphysema ([place])   . Lobar pneumonia ([place])   . Empyema lung ([place]) 04/16/2015  . HIV (human immunodeficiency virus infection) ([place])   . Tobacco abuse 03/21/2015  . Lung mass 08/31/2014  . Eosinophilic folliculitis 99/24/2683  . Myalgia 04/23/2014  . AIDS ([place]) 03/22/2014  . CAP (community acquired pneumonia) 03/22/2014  . CAD (coronary artery  disease) 11/13/2011     Health Maintenance Due  Topic Date Due  . INFLUENZA VACCINE  07/29/2019    Sochx: no smoking or drinking  Review of Systems Review of Systems  Constitutional: Negative for fever, chills, diaphoresis, activity change, appetite change, fatigue and unexpected weight change.  HENT: Negative for congestion, sore throat, rhinorrhea, sneezing, trouble swallowing and sinus pressure.  Eyes: Negative for photophobia and visual disturbance.  Respiratory: Negative for cough, chest tightness, shortness of  breath, wheezing and stridor.  Cardiovascular: Negative for chest pain, palpitations and leg swelling.  Gastrointestinal: Negative for nausea, vomiting, abdominal pain, diarrhea, constipation, blood in stool, abdominal distention and anal bleeding.  Genitourinary: Negative for dysuria, hematuria, flank pain and difficulty urinating.  Musculoskeletal: Negative for myalgias, back pain, joint swelling, arthralgias and gait problem.  Skin: Negative for color change, pallor, rash and wound.  Neurological: Negative for dizziness, tremors, weakness and light-headedness.  Hematological: Negative for adenopathy. Does not bruise/bleed easily.  Psychiatric/Behavioral: Negative for behavioral problems, confusion, sleep disturbance, dysphoric mood, decreased concentration and agitation.    Physical Exam   BP 119/70   Pulse 76   Temp 98 F (36.7 C)   Physical Exam  Constitutional: He is oriented to person, place, and time. He appears well-developed and well-nourished. No distress.  HENT:  Mouth/Throat: Oropharynx is clear and moist. No oropharyngeal exudate.  Cardiovascular: Normal rate, regular rhythm and normal heart sounds. Exam reveals no gallop and no friction rub.  No murmur heard.  Pulmonary/Chest: Effort normal and breath sounds normal. No respiratory distress. He has no wheezes.  Lymphadenopathy:  He has no cervical adenopathy.  Ext: clubbing- stage 3 bilaterally, unchanged Psychiatric: He has a normal mood and affect. His behavior is normal.    Lab Results  Component Value Date   CD4TCELL 20 (L) 06/06/2019   Lab Results  Component Value Date   CD4TABS 594 06/06/2019   CD4TABS 680 02/01/2019   CD4TABS 700 11/07/2018   Lab Results  Component Value Date   HIV1RNAQUANT 63 (H) 06/06/2019   No results found for: HEPBSAB Lab Results  Component Value Date   LABRPR NON-REACTIVE 11/07/2018    CBC Lab Results  Component Value Date   WBC 14.4 (H) 09/26/2019   RBC 3.46 (L)  09/26/2019   HGB 11.7 (L) 09/26/2019   HCT 31.2 (L) 09/26/2019   PLT 198 09/26/2019   MCV 90.2 09/26/2019   MCH 33.8 09/26/2019   MCHC 37.5 (H) 09/26/2019   RDW 12.9 09/26/2019   LYMPHSABS 3.0 09/19/2019   MONOABS 0.7 09/19/2019   EOSABS 0.1 09/19/2019    BMET Lab Results  Component Value Date   NA 136 09/26/2019   K 3.8 09/26/2019   CL 104 09/26/2019   CO2 23 09/26/2019   GLUCOSE 126 (H) 09/26/2019   BUN 18 09/26/2019   CREATININE 1.36 (H) 09/26/2019   CALCIUM 8.3 (L) 09/26/2019   GFRNONAA >60 09/26/2019   GFRAA >60 09/26/2019    Assessment and Plan  hiv disease= will plan on continue on current regimen. Plan on getting labs  Health maintenance = will give flu shot  aki on CKD = will check kidney function to see back to baseline and ua for proteinuria

## 2019-10-03 ENCOUNTER — Telehealth: Payer: Self-pay

## 2019-10-03 LAB — T-HELPER CELL (CD4) - (RCID CLINIC ONLY)
CD4 % Helper T Cell: 26 % — ABNORMAL LOW (ref 33–65)
CD4 T Cell Abs: 863 /uL (ref 400–1790)

## 2019-10-03 NOTE — Telephone Encounter (Signed)
See message below °

## 2019-10-03 NOTE — Telephone Encounter (Signed)
Patient called concerning removal of his bandage.  Stated that he was told to remove the bandage after 7 days. Patient had Right Total Hip on 09/25/2019.  Patient has appt.on Friday, 10/06/2019.  Cb# is [phone].  Please advise.  Thank you.

## 2019-10-03 NOTE — Telephone Encounter (Signed)
Change per d/c instructions which should say after 7 days and then every other day until f/u.  Apply sterile gauze

## 2019-10-04 LAB — COMPLETE METABOLIC PANEL WITH GFR
AG Ratio: 1.2 (calc) (ref 1.0–2.5)
ALT: 14 U/L (ref 9–46)
AST: 20 U/L (ref 10–40)
Albumin: 3.6 g/dL (ref 3.6–5.1)
Alkaline phosphatase (APISO): 62 U/L (ref 36–130)
BUN: 16 mg/dL (ref 7–25)
CO2: 28 mmol/L (ref 20–32)
Calcium: 8.6 mg/dL (ref 8.6–10.3)
Chloride: 106 mmol/L (ref 98–110)
Creat: 1 mg/dL (ref 0.60–1.35)
GFR, Est African American: 110 mL/min/{1.73_m2} (ref >=60)
GFR, Est Non African American: 95 mL/min/{1.73_m2} (ref >=60)
Globulin: 3.1 g/dL (calc) (ref 1.9–3.7)
Glucose, Bld: 98 mg/dL (ref 65–99)
Potassium: 5.3 mmol/L (ref 3.5–5.3)
Sodium: 138 mmol/L (ref 135–146)
Total Bilirubin: 0.5 mg/dL (ref 0.2–1.2)
Total Protein: 6.7 g/dL (ref 6.1–8.1)

## 2019-10-04 LAB — CBC WITH DIFFERENTIAL/PLATELET
Absolute Monocytes: 738 cells/uL (ref 200–950)
Basophils Absolute: 12 cells/uL (ref 0–200)
Basophils Relative: 0.1 %
Eosinophils Absolute: 98 cells/uL (ref 15–500)
Eosinophils Relative: 0.8 %
HCT: 36.2 % — ABNORMAL LOW (ref 38.5–50.0)
Hemoglobin: 12.6 g/dL — ABNORMAL LOW (ref 13.2–17.1)
Lymphs Abs: 3493 cells/uL (ref 850–3900)
MCH: 34 pg — ABNORMAL HIGH (ref 27.0–33.0)
MCHC: 34.8 g/dL (ref 32.0–36.0)
MCV: 97.6 fL (ref 80.0–100.0)
MPV: 10.5 fL (ref 7.5–12.5)
Monocytes Relative: 6 %
Neutro Abs: 7958 cells/uL — ABNORMAL HIGH (ref 1500–7800)
Neutrophils Relative %: 64.7 %
Platelets: 248 10*3/uL (ref 140–400)
RBC: 3.71 10*6/uL — ABNORMAL LOW (ref 4.20–5.80)
RDW: 13.7 % (ref 11.0–15.0)
Total Lymphocyte: 28.4 %
WBC: 12.3 10*3/uL — ABNORMAL HIGH (ref 3.8–10.8)

## 2019-10-04 LAB — HIV-1 RNA QUANT-NO REFLEX-BLD
HIV 1 RNA Quant: 83 copies/mL — ABNORMAL HIGH
HIV-1 RNA Quant, Log: 1.92 Log copies/mL — ABNORMAL HIGH

## 2019-10-04 NOTE — Telephone Encounter (Signed)
Called patient to advise  °

## 2019-10-06 ENCOUNTER — Inpatient Hospital Stay: Payer: Medicare Other | Admitting: Orthopaedic Surgery

## 2019-10-10 ENCOUNTER — Inpatient Hospital Stay: Payer: Medicare Other | Admitting: Orthopaedic Surgery

## 2019-10-10 ENCOUNTER — Encounter: Payer: Self-pay | Admitting: Orthopaedic Surgery

## 2019-10-10 ENCOUNTER — Ambulatory Visit (INDEPENDENT_AMBULATORY_CARE_PROVIDER_SITE_OTHER): Payer: Medicare Other | Admitting: Orthopaedic Surgery

## 2019-10-10 ENCOUNTER — Other Ambulatory Visit: Payer: Self-pay

## 2019-10-10 DIAGNOSIS — Z96641 Presence of right artificial hip joint: Secondary | ICD-10-CM

## 2019-10-10 MED ORDER — CEPHALEXIN 500 MG PO CAPS: 500.0000 mg | ORAL_CAPSULE | Freq: Four times a day (QID) | ORAL | 0 refills | Status: DC

## 2019-10-10 NOTE — Progress Notes (Signed)
Post-Op Visit Note   Patient: Rodney Walker           Date of Birth: 11-Apr-1981           MRN: [id number] Visit Date: 10/10/2019 PCP: Patient, No Pcp Per   Assessment & Plan:  Chief Complaint:  Chief Complaint  Patient presents with  . Right Hip - Pain   Visit Diagnoses:  1. Status post total replacement of right hip     Plan: [name] is 2-week status post right total hip replacement for avascular necrosis.  Overall he is doing well.  He is progressing with home health PT.  Surgical incision is healed without any evidence of infection.  He has a small seroma which I drained 10 cc of serosanguineous fluid from.  He is to continue to ice this regularly.  He will continue with PT as prescribed.  I sent in prophylactic Keflex for the seroma.  We will recheck him in 4 weeks with standing AP pelvis and lateral hip.  Follow-Up Instructions: Return in about 4 weeks (around 11/07/2019).   Orders:  No orders of the defined types were placed in this encounter.  Meds ordered this encounter  Medications  . cephALEXin (KEFLEX) 500 MG capsule    Sig: Take 1 capsule (500 mg total) by mouth 4 (four) times daily.    Dispense:  40 capsule    Refill:  0    Imaging: No results found.  PMFS History: Patient Active Problem List   Diagnosis Date Noted  . Avascular necrosis of bone of right hip (HCC) 09/25/2019  . 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 Hyperlipidemia 04/05/2018  . Ischemic cardiomyopathy 04/05/2018  . History of acute inferior wall MI 03/24/2018  . Marijuana abuse, continuous 10/29/2016  . Moderate persistent asthma without complication 10/29/2016  . Allergic rhinitis 10/08/2016  . Anemia 05/03/2015  . Lung abscess (HCC) 04/22/2015  . Sepsis due to pneumonia (HCC) 04/17/2015  . Bullous emphysema (HCC)   . Lobar pneumonia (HCC)   . Empyema lung (HCC) 04/16/2015  . HIV (human immunodeficiency virus infection) (HCC)   . Tobacco abuse  03/21/2015  . Lung mass 08/31/2014  . 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 Myalgia 04/23/2014  . AIDS (HCC) 03/22/2014  . CAP (community acquired pneumonia) 03/22/2014  . CAD (coronary artery disease) 11/13/2011   Past Medical History:  Diagnosis Date  . Anemia   . COPD (chronic obstructive pulmonary disease) (HCC)   . Coronary artery disease    MI 2010 - dissection/plaque rupture in LAD tx with antiplt/antithrombotics >> relook cath with resolution (no PCI performed) // Inf STEMI 3/19: OM1 30; pRCA 100 >> DES // Echo 3/19:  mild LVH, EF 40-45, inf/inf-sept HK, trivial MR  . Eczema   . History of myocardial infarction 2010; 2019  . History of pleural empyema   . HIV infection (HCC)   . Hyperlipidemia   . Immune deficiency disorder (HCC)   . Ischemic cardiomyopathy 04/05/2018  . Moderate persistent asthma without complication 10/29/2016  . Myocardial infarction (HCC)    2009, 2019  . Pneumonia     Family History  Problem Relation Age of Onset  . Diabetes Mother   . Eczema Mother   . Asthma Mother   . COPD Mother   . Hypertension Mother   . Hyperlipidemia Mother   . Stroke Mother   . Heart disease Mother   . Sleep apnea Mother   . Liver cancer Father   .  Asthma Brother     Past Surgical History:  Procedure Laterality Date  . CARDIAC CATHETERIZATION  06/24/2009   EF 60%  . CARDIAC CATHETERIZATION  06/17/2009   EF 45%. ANTERIOR HYPOKINESIS  . CORONARY STENT INTERVENTION N/A 03/24/2018   Procedure: CORONARY STENT INTERVENTION;  Surgeon: Sherren Mocha, MD;  Location: [place] [facility];  Service: Cardiovascular;  Laterality: N/A;  . CORONARY/GRAFT ACUTE MI REVASCULARIZATION N/A 03/24/2018   Procedure: Coronary/Graft Acute MI Revascularization;  Surgeon: Sherren Mocha, MD;  Location: [place] [facility];  Service: Cardiovascular;  Laterality: N/A;  . LEFT HEART CATH AND CORONARY ANGIOGRAPHY N/A 03/24/2018   Procedure: LEFT HEART CATH AND CORONARY ANGIOGRAPHY;  Surgeon:  Sherren Mocha, MD;  Location: [place] [facility];  Service: Cardiovascular;  Laterality: N/A;  . TOTAL HIP ARTHROPLASTY Right 09/25/2019   Procedure: RIGHT TOTAL HIP ARTHROPLASTY ANTERIOR APPROACH;  Surgeon: Leandrew Koyanagi, MD;  Location: [place];  Service: Orthopedics;  Laterality: Right;  . US ECHOCARDIOGRAPHY  12/09/2009   EF 55-60%  . VIDEO ASSISTED THORACOSCOPY (VATS)/ LOBECTOMY Right 04/23/2015   Procedure: VIDEO ASSISTED THORACOSCOPY (VATS)/RIGHT upper  and right middle LOBECTOMY;  Surgeon: Ivin Poot, MD;  Location: [place];  Service: Thoracic;  Laterality: Right;  Marland Kitchen VIDEO BRONCHOSCOPY Bilateral 09/05/2014   Procedure: VIDEO BRONCHOSCOPY WITH FLUORO;  Surgeon: Wilhelmina Mcardle, MD;  Location: [facility];  Service: Cardiopulmonary;  Laterality: Bilateral;  . VIDEO BRONCHOSCOPY Bilateral 04/10/2015   Procedure: VIDEO BRONCHOSCOPY WITH FLUORO;  Surgeon: Juanito Doom, MD;  Location: [place];  Service: Cardiopulmonary;  Laterality: Bilateral;   Social History   Occupational History  . Occupation: unemployed  Tobacco Use  . Smoking status: Former Smoker    Packs/day: 0.25    Years: 17.00    Pack years: 4.25    Types: Cigarettes, Cigars    Quit date: 03/23/2015    Years since quitting: 4.5  . Smokeless tobacco: Never Used  Substance and Sexual Activity  . Alcohol use: No    Alcohol/week: 0.0 standard drinks  . Drug use: Yes    Frequency: 7.0 times per week    Types: Marijuana  . Sexual activity: Not Currently    Partners: Male    Comment: given condoms

## 2019-10-25 ENCOUNTER — Other Ambulatory Visit: Payer: Self-pay | Admitting: Cardiovascular Disease

## 2019-10-30 ENCOUNTER — Telehealth: Payer: Self-pay

## 2019-10-30 NOTE — Telephone Encounter (Signed)
I have started a non-formulary Effient PA through covermymeds. Key: [id number]

## 2019-10-31 ENCOUNTER — Other Ambulatory Visit: Payer: Self-pay

## 2019-10-31 MED ORDER — PRASUGREL HCL 10 MG PO TABS: 10.0000 mg | ORAL_TABLET | Freq: Every day | ORAL | 1 refills | Status: DC

## 2019-10-31 NOTE — Telephone Encounter (Signed)
Letter received from [employer] stating that they have denied the pts Effient PA. Reason: Pt must first try and fail generic Prasugrel prior to taking Effient.  RX for Prasugrel sent to [facility] to fill #[phone] with 1 refill.

## 2019-10-31 NOTE — Telephone Encounter (Signed)
I called [facility] to verify that the pts ins is covering generic Prasugrel and was advised that the medication is covered 100% and will not cost the pt nothing.

## 2019-11-07 ENCOUNTER — Other Ambulatory Visit: Payer: Self-pay

## 2019-11-07 ENCOUNTER — Encounter: Payer: Self-pay | Admitting: Orthopaedic Surgery

## 2019-11-07 ENCOUNTER — Ambulatory Visit (INDEPENDENT_AMBULATORY_CARE_PROVIDER_SITE_OTHER): Payer: Medicare Other

## 2019-11-07 ENCOUNTER — Ambulatory Visit (INDEPENDENT_AMBULATORY_CARE_PROVIDER_SITE_OTHER): Payer: Medicare Other | Admitting: Orthopaedic Surgery

## 2019-11-07 VITALS — Ht 64.0 in | Wt 135.0 lb

## 2019-11-07 DIAGNOSIS — Z96641 Presence of right artificial hip joint: Secondary | ICD-10-CM

## 2019-11-07 NOTE — Progress Notes (Signed)
Post-Op Visit Note   Patient: Rodney Walker           Date of Birth: 1981/06/03           MRN: [id number] Visit Date: 11/07/2019 PCP: Patient, No Pcp Per   Assessment & Plan:  Chief Complaint:  Chief Complaint  Patient presents with  . Right Hip - Routine Post Op    Right THA DOS 09/25/2019   Visit Diagnoses:  1. Status post total replacement of right hip     Plan: Laural Walker is 6 weeks status post right total hip placement.  He is doing well.  He continues to do home exercises.  He is taking Tylenol for breakthrough pain.  He is overall very happy with his recovery.  Surgical scar is fully healed.  He has minimal swelling around the thigh.  There is no fluctuance and no evidence of infection.  At this point he may go back to his daily baby aspirin.  Increase activity as tolerated.  Questions encouraged and answered.  Follow-up in 6 weeks.  Follow-Up Instructions: Return in about 6 weeks (around 12/19/2019).   Orders:  Orders Placed This Encounter  Procedures  . XR HIP UNILAT W OR W/O PELVIS 2-3 VIEWS RIGHT   No orders of the defined types were placed in this encounter.   Imaging: Xr Hip Unilat W Or W/o Pelvis 2-3 Views Right  Result Date: 11/07/2019 Stable right total hip placement without complication.  Avascular necrosis of the left femoral head   PMFS History: Patient Active Problem List   Diagnosis Date Noted  . Avascular necrosis of bone of right hip (HCC) 09/25/2019  . 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 Hyperlipidemia 04/05/2018  . Ischemic cardiomyopathy 04/05/2018  . History of acute inferior wall MI 03/24/2018  . Marijuana abuse, continuous 10/29/2016  . Moderate persistent asthma without complication 10/29/2016  . Allergic rhinitis 10/08/2016  . Anemia 05/03/2015  . Lung abscess (HCC) 04/22/2015  . Sepsis due to pneumonia (HCC) 04/17/2015  . Bullous emphysema (HCC)   . Lobar pneumonia (HCC)   . Empyema lung (HCC)  04/16/2015  . HIV (human immunodeficiency virus infection) (HCC)   . Tobacco abuse 03/21/2015  . Lung mass 08/31/2014  . 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 Myalgia 04/23/2014  . AIDS (HCC) 03/22/2014  . CAP (community acquired pneumonia) 03/22/2014  . CAD (coronary artery disease) 11/13/2011   Past Medical History:  Diagnosis Date  . Anemia   . COPD (chronic obstructive pulmonary disease) (HCC)   . Coronary artery disease    MI 2010 - dissection/plaque rupture in LAD tx with antiplt/antithrombotics >> relook cath with resolution (no PCI performed) // Inf STEMI 3/19: OM1 30; pRCA 100 >> DES // Echo 3/19:  mild LVH, EF 40-45, inf/inf-sept HK, trivial MR  . Eczema   . History of myocardial infarction 2010; 2019  . History of pleural empyema   . HIV infection (HCC)   . Hyperlipidemia   . Immune deficiency disorder (HCC)   . Ischemic cardiomyopathy 04/05/2018  . Moderate persistent asthma without complication 10/29/2016  . Myocardial infarction (HCC)    2009, 2019  . Pneumonia     Family History  Problem Relation Age of Onset  . Diabetes Mother   . Eczema Mother   . Asthma Mother   . COPD Mother   . Hypertension Mother   . Hyperlipidemia Mother   . Stroke Mother   . Heart disease Mother   .  Sleep apnea Mother   . Liver cancer Father   . Asthma Brother     Past Surgical History:  Procedure Laterality Date  . CARDIAC CATHETERIZATION  06/24/2009   EF 60%  . CARDIAC CATHETERIZATION  06/17/2009   EF 45%. ANTERIOR HYPOKINESIS  . CORONARY STENT INTERVENTION N/A 03/24/2018   Procedure: CORONARY STENT INTERVENTION;  Surgeon: Sherren Mocha, MD;  Location: [place] [facility];  Service: Cardiovascular;  Laterality: N/A;  . CORONARY/GRAFT ACUTE MI REVASCULARIZATION N/A 03/24/2018   Procedure: Coronary/Graft Acute MI Revascularization;  Surgeon: Sherren Mocha, MD;  Location: [place] [facility];  Service: Cardiovascular;  Laterality: N/A;  . LEFT HEART CATH AND CORONARY ANGIOGRAPHY  N/A 03/24/2018   Procedure: LEFT HEART CATH AND CORONARY ANGIOGRAPHY;  Surgeon: Sherren Mocha, MD;  Location: [place] [facility];  Service: Cardiovascular;  Laterality: N/A;  . TOTAL HIP ARTHROPLASTY Right 09/25/2019   Procedure: RIGHT TOTAL HIP ARTHROPLASTY ANTERIOR APPROACH;  Surgeon: Leandrew Koyanagi, MD;  Location: [place];  Service: Orthopedics;  Laterality: Right;  . US ECHOCARDIOGRAPHY  12/09/2009   EF 55-60%  . VIDEO ASSISTED THORACOSCOPY (VATS)/ LOBECTOMY Right 04/23/2015   Procedure: VIDEO ASSISTED THORACOSCOPY (VATS)/RIGHT upper  and right middle LOBECTOMY;  Surgeon: Ivin Poot, MD;  Location: [place];  Service: Thoracic;  Laterality: Right;  Marland Kitchen VIDEO BRONCHOSCOPY Bilateral 09/05/2014   Procedure: VIDEO BRONCHOSCOPY WITH FLUORO;  Surgeon: Wilhelmina Mcardle, MD;  Location: [facility] ENDOSCOPY;  Service: Cardiopulmonary;  Laterality: Bilateral;  . VIDEO BRONCHOSCOPY Bilateral 04/10/2015   Procedure: VIDEO BRONCHOSCOPY WITH FLUORO;  Surgeon: Juanito Doom, MD;  Location: [place];  Service: Cardiopulmonary;  Laterality: Bilateral;   Social History   Occupational History  . Occupation: unemployed  Tobacco Use  . Smoking status: Former Smoker    Packs/day: 0.25    Years: 17.00    Pack years: 4.25    Types: Cigarettes, Cigars    Quit date: 03/23/2015    Years since quitting: 4.6  . Smokeless tobacco: Never Used  Substance and Sexual Activity  . Alcohol use: No    Alcohol/week: 0.0 standard drinks  . Drug use: Yes    Frequency: 7.0 times per week    Types: Marijuana  . Sexual activity: Not Currently    Partners: Male    Comment: given condoms

## 2019-12-19 ENCOUNTER — Encounter: Payer: Self-pay | Admitting: Orthopaedic Surgery

## 2019-12-19 ENCOUNTER — Ambulatory Visit (INDEPENDENT_AMBULATORY_CARE_PROVIDER_SITE_OTHER): Payer: Medicare Other | Admitting: Orthopaedic Surgery

## 2019-12-19 ENCOUNTER — Other Ambulatory Visit: Payer: Self-pay

## 2019-12-19 DIAGNOSIS — Z96641 Presence of right artificial hip joint: Secondary | ICD-10-CM

## 2019-12-19 MED ORDER — MELOXICAM 7.5 MG PO TABS: 7.5000 mg | ORAL_TABLET | Freq: Two times a day (BID) | ORAL | 2 refills | Status: DC | PRN

## 2019-12-19 MED ORDER — METHOCARBAMOL 500 MG PO TABS: 500.0000 mg | ORAL_TABLET | Freq: Four times a day (QID) | ORAL | 2 refills | Status: DC | PRN

## 2019-12-19 NOTE — Progress Notes (Signed)
Post-Op Visit Note   Patient: Rodney Walker           Date of Birth: 02-Dec-1981           MRN: [id number] Visit Date: 12/19/2019 PCP: Patient, No Pcp Per   Assessment & Plan:  Chief Complaint:  Chief Complaint  Patient presents with  . Right Hip - Follow-up   Visit Diagnoses:  1. Status post total replacement of right hip     Plan: Rodney Walker is 12 weeks status post right total hip replacement for avascular necrosis.  He is doing well.    Surgical scar is fully healed.  He is ambulating well without any limp.  At this point he is doing very well.  Dental prophylaxis reinforced today.  We will recheck him in about 9 months for his 1 year visit.  Questions encouraged and answered.  Patient knows he can follow-up sooner if he has any issues or problems.  Follow-Up Instructions: Return in about 9 months (around 09/18/2020).   Orders:  No orders of the defined types were placed in this encounter.  Meds ordered this encounter  Medications  . meloxicam (MOBIC) 7.5 MG tablet    Sig: Take 1 tablet (7.5 mg total) by mouth 2 (two) times daily as needed for pain.    Dispense:  30 tablet    Refill:  2  . methocarbamol (ROBAXIN) 500 MG tablet    Sig: Take 1 tablet (500 mg total) by mouth every 6 (six) hours as needed for muscle spasms.    Dispense:  30 tablet    Refill:  2    Imaging: No results found.  PMFS History: Patient Active Problem List   Diagnosis Date Noted  . Avascular necrosis of bone of right hip ([place]) 09/25/2019  . 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 Hyperlipidemia 04/05/2018  . Ischemic cardiomyopathy 04/05/2018  . History of acute inferior wall MI 03/24/2018  . Marijuana abuse, continuous 10/29/2016  . Moderate persistent asthma without complication 82/42/3536  . Allergic rhinitis 10/08/2016  . Anemia 05/03/2015  . Lung abscess ([place]) 04/22/2015  . Sepsis due to pneumonia ([place]) 04/17/2015  . Bullous emphysema ([place])   .  Lobar pneumonia ([place])   . Empyema lung ([place]) 04/16/2015  . HIV (human immunodeficiency virus infection) ([place])   . Tobacco abuse 03/21/2015  . Lung mass 08/31/2014  . 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 Myalgia 04/23/2014  . AIDS ([place]) 03/22/2014  . CAP (community acquired pneumonia) 03/22/2014  . CAD (coronary artery disease) 11/13/2011   Past Medical History:  Diagnosis Date  . Anemia   . COPD (chronic obstructive pulmonary disease) ([place])   . Coronary artery disease    MI 2010 - dissection/plaque rupture in LAD tx with antiplt/antithrombotics >> relook cath with resolution (no PCI performed) // Inf STEMI 3/19: OM1 30; pRCA 100 >> DES // Echo 3/19:  mild LVH, EF 40-45, inf/inf-sept HK, trivial MR  . Eczema   . History of myocardial infarction 2010; 2019  . History of pleural empyema   . HIV infection ([place])   . Hyperlipidemia   . Immune deficiency disorder ([place])   . Ischemic cardiomyopathy 04/05/2018  . Moderate persistent asthma without complication 08/02/7618  . Myocardial infarction ([place])    2009, 2019  . Pneumonia     Family History  Problem Relation Age of Onset  . Diabetes Mother   . Eczema Mother   . Asthma Mother   . COPD Mother   .  Hypertension Mother   . Hyperlipidemia Mother   . Stroke Mother   . Heart disease Mother   . Sleep apnea Mother   . Liver cancer Father   . Asthma Brother     Past Surgical History:  Procedure Laterality Date  . CARDIAC CATHETERIZATION  06/24/2009   EF 60%  . CARDIAC CATHETERIZATION  06/17/2009   EF 45%. ANTERIOR HYPOKINESIS  . CORONARY STENT INTERVENTION N/A 03/24/2018   Procedure: CORONARY STENT INTERVENTION;  Surgeon: Tonny Bollman, MD;  Location[facility] INVASIVE CV LAB;  Service: Cardiovascular;  Laterality: N/A;  . CORONARY/GRAFT ACUTE MI REVASCULARIZATION N/A 03/24/2018   Procedure: Coronary/Graft Acute MI Revascularization;  Surgeon: Tonny Bollman, MD;  Location: [facility] INVASIVE CV LAB;  Service: Cardiovascular;  Laterality: N/A;   . LEFT HEART CATH AND CORONARY ANGIOGRAPHY N/A 03/24/2018   Procedure: LEFT HEART CATH AND CORONARY ANGIOGRAPHY;  Surgeon: Tonny Bollman, MD;  Location: [facility] INVASIVE CV LAB;  Service: Cardiovascular;  Laterality: N/A;  . TOTAL HIP ARTHROPLASTY Right 09/25/2019   Procedure: RIGHT TOTAL HIP ARTHROPLASTY ANTERIOR APPROACH;  Surgeon: Tarry Kos, MD;  Location: [facility] OR;  Service: Orthopedics;  Laterality: Right;  . US ECHOCARDIOGRAPHY  12/09/2009   EF 55-60%  . VIDEO ASSISTED THORACOSCOPY (VATS)/ LOBECTOMY Right 04/23/2015   Procedure: VIDEO ASSISTED THORACOSCOPY (VATS)/RIGHT upper  and right middle LOBECTOMY;  Surgeon: Kerin Perna, MD;  Location: [facility] OR;  Service: Thoracic;  Laterality: Right;  Marland Kitchen VIDEO BRONCHOSCOPY Bilateral 09/05/2014   Procedure: VIDEO BRONCHOSCOPY WITH FLUORO;  Surgeon: Merwyn Katos, MD;  Location: [facility] ENDOSCOPY;  Service: Cardiopulmonary;  Laterality: Bilateral;  . VIDEO BRONCHOSCOPY Bilateral 04/10/2015   Procedure: VIDEO BRONCHOSCOPY WITH FLUORO;  Surgeon: Lupita Leash, MD;  Location: [facility] ENDOSCOPY;  Service: Cardiopulmonary;  Laterality: Bilateral;   Social History   Occupational History  . Occupation: unemployed  Tobacco Use  . Smoking status: Former Smoker    Packs/day: 0.25    Years: 17.00    Pack years: 4.25    Types: Cigarettes, Cigars    Quit date: 03/23/2015    Years since quitting: 4.7  . Smokeless tobacco: Never Used  Substance and Sexual Activity  . Alcohol use: No    Alcohol/week: 0.0 standard drinks  . Drug use: Yes    Frequency: 7.0 times per week    Types: Marijuana  . Sexual activity: Not Currently    Partners: Male    Comment: given condoms

## 2020-01-03 ENCOUNTER — Other Ambulatory Visit (HOSPITAL_COMMUNITY)
Admission: RE | Admit: 2020-01-03 | Discharge: 2020-01-03 | Disposition: A | Discharge status: Home / Self Care | Payer: Medicare Other | Source: Ambulatory Visit | Attending: Internal Medicine | Admitting: Internal Medicine

## 2020-01-03 ENCOUNTER — Other Ambulatory Visit: Payer: Self-pay

## 2020-01-03 ENCOUNTER — Ambulatory Visit (INDEPENDENT_AMBULATORY_CARE_PROVIDER_SITE_OTHER): Payer: Medicare Other | Admitting: Internal Medicine

## 2020-01-03 ENCOUNTER — Encounter: Payer: Self-pay | Admitting: Internal Medicine

## 2020-01-03 VITALS — BP 97/62 | HR 72 | Temp 98.0°F | Ht 64.0 in | Wt 135.0 lb

## 2020-01-03 DIAGNOSIS — B20 Human immunodeficiency virus [HIV] disease: Secondary | ICD-10-CM

## 2020-01-03 DIAGNOSIS — Z79899 Other long term (current) drug therapy: Secondary | ICD-10-CM

## 2020-01-03 DIAGNOSIS — Z113 Encounter for screening for infections with a predominantly sexual mode of transmission: Secondary | ICD-10-CM | POA: Insufficient documentation

## 2020-01-03 DIAGNOSIS — R801 Persistent proteinuria, unspecified: Secondary | ICD-10-CM | POA: Diagnosis not present

## 2020-01-03 NOTE — Progress Notes (Signed)
RFV: follow up for hiv disease  Patient ID: Rodney Walker, male   DOB: [birth year], 39 y.o.   MRN: [id number]  HPI 39yo M with hiv disease, CD 4 count 863/VL 83. Only missed one dose since we last saw him, on biktarvy -prezcobix. Doing well with taking his medications mostly. He reports to be in good health. Not had any covid exposure or recent hospitalizations  Outpatient Encounter Medications as of 01/03/2020  Medication Sig  . aspirin EC 81 MG tablet Take 1 tablet (81 mg total) by mouth 2 (two) times daily.  Marland Kitchen BIKTARVY 50-200-25 MG TABS tablet TAKE 1 TABLET BY MOUTH DAILY  . cetirizine (ZYRTEC) 10 MG tablet TAKE 1 TABLET BY MOUTH EVERY DAY  . darunavir-cobicistat (PREZCOBIX) 800-150 MG tablet TAKE 1 TABLET BY MOUTH DAILY WITH BREAKFAST. SWALLOW WHOLE. DO NOT CRUSH, BREAK OR CHEW TABLETS.TAKE WITH FOOD  . lisinopril (ZESTRIL) 2.5 MG tablet TAKE 1 TABLET(2.5 MG) BY MOUTH DAILY (Patient taking differently: Take 2.5 mg by mouth daily. )  . meloxicam (MOBIC) 7.5 MG tablet Take 1 tablet (7.5 mg total) by mouth 2 (two) times daily as needed for pain.  . methocarbamol (ROBAXIN) 500 MG tablet Take 1 tablet (500 mg total) by mouth every 6 (six) hours as needed for muscle spasms.  . methocarbamol (ROBAXIN) 750 MG tablet Take 1 tablet (750 mg total) by mouth 2 (two) times daily as needed for muscle spasms.  . metoprolol succinate (TOPROL-XL) 25 MG 24 hr tablet TAKE 1 TABLET(25 MG) BY MOUTH DAILY  . nitroGLYCERIN (NITROSTAT) 0.4 MG SL tablet Place 1 tablet (0.4 mg total) under the tongue every 5 (five) minutes x 3 doses as needed for chest pain.  Marland Kitchen ondansetron (ZOFRAN) 4 MG tablet Take 1-2 tablets (4-8 mg total) by mouth every 8 (eight) hours as needed for nausea or vomiting.  Marland Kitchen oxyCODONE (OXY IR/ROXICODONE) 5 MG immediate release tablet Take 1-2 tablets (5-10 mg total) by mouth every 8 (eight) hours as needed for severe pain.  . prasugrel (EFFIENT) 10 MG TABS tablet Take 1 tablet (10 mg  total) by mouth daily.  . promethazine (PHENERGAN) 25 MG tablet Take 1 tablet (25 mg total) by mouth every 6 (six) hours as needed for nausea.  Marland Kitchen senna-docusate (SENOKOT S) 8.6-50 MG tablet Take 1-2 tablets by mouth at bedtime as needed.  . cephALEXin (KEFLEX) 500 MG capsule Take 1 capsule (500 mg total) by mouth 4 (four) times daily. (Patient not taking: Reported on 01/03/2020)   No facility-administered encounter medications on file as of 01/03/2020.     Patient Active Problem List   Diagnosis Date Noted  . Avascular necrosis of bone of right hip ([place]) 09/25/2019  . 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 Hyperlipidemia 04/05/2018  . Ischemic cardiomyopathy 04/05/2018  . History of acute inferior wall MI 03/24/2018  . Marijuana abuse, continuous 10/29/2016  . Moderate persistent asthma without complication 19/62/2297  . Allergic rhinitis 10/08/2016  . Anemia 05/03/2015  . Lung abscess ([place]) 04/22/2015  . Sepsis due to pneumonia ([place]) 04/17/2015  . Bullous emphysema ([place])   . Lobar pneumonia ([place])   . Empyema lung ([place]) 04/16/2015  . HIV (human immunodeficiency virus infection) ([place])   . Tobacco abuse 03/21/2015  . Lung mass 08/31/2014  . Eosinophilic folliculitis 98/92/1194  . Myalgia 04/23/2014  . AIDS ([place]) 03/22/2014  . CAP (community acquired pneumonia) 03/22/2014  . CAD (coronary artery disease) 11/13/2011   Social History   Tobacco Use  .  Smoking status: Former Smoker    Packs/day: 0.25    Years: 17.00    Pack years: 4.25    Types: Cigarettes, Cigars    Quit date: 03/23/2015    Years since quitting: 4.8  . Smokeless tobacco: Never Used  Substance Use Topics  . Alcohol use: No    Alcohol/week: 0.0 standard drinks  . Drug use: Yes    Frequency: 7.0 times per week    Types: Marijuana     There are no preventive care reminders to display for this patient.   Review of Systems + headaches for a week, but now improved. Thinks it was allergies. 12  point ros otherwise negative Physical Exam   BP 97/62   Pulse 72   Temp 98 F (36.7 C)   Ht 5\' 4"  (1.626 m)   Wt 135 lb (61.2 kg)   BMI 23.17 kg/m   Physical Exam  Constitutional: He is oriented to person, place, and time. He appears well-developed and well-nourished. No distress.  HENT:  Mouth/Throat: Oropharynx is clear and moist. No oropharyngeal exudate.  Cardiovascular: Normal rate, regular rhythm and normal heart sounds. Exam reveals no gallop and no friction rub.  No murmur heard.  Pulmonary/Chest: Effort normal and breath sounds normal. No respiratory distress. He has no wheezes.  Abdominal: Soft. Bowel sounds are normal. He exhibits no distension. There is no tenderness.  Lymphadenopathy:  He has no cervical adenopathy.  Neurological: He is alert and oriented to person, place, and time.  Skin: Skin is warm and dry. No rash noted. No erythema.  Ext: + clubbing Psychiatric: He has a normal mood and affect. His behavior is normal.    Lab Results  Component Value Date   CD4TCELL 26 (L) 10/02/2019   Lab Results  Component Value Date   CD4TABS 863 10/02/2019   CD4TABS 594 06/06/2019   CD4TABS 680 02/01/2019   Lab Results  Component Value Date   HIV1RNAQUANT 83 (H) 10/02/2019   No results found for: HEPBSAB Lab Results  Component Value Date   LABRPR NON-REACTIVE 11/07/2018    CBC Lab Results  Component Value Date   WBC 12.3 (H) 10/02/2019   RBC 3.71 (L) 10/02/2019   HGB 12.6 (L) 10/02/2019   HCT 36.2 (L) 10/02/2019   PLT 248 10/02/2019   MCV 97.6 10/02/2019   MCH 34.0 (H) 10/02/2019   MCHC 34.8 10/02/2019   RDW 13.7 10/02/2019   LYMPHSABS 3,493 10/02/2019   MONOABS 0.7 09/19/2019   EOSABS 98 10/02/2019    BMET Lab Results  Component Value Date   NA 138 10/02/2019   K 5.3 10/02/2019   CL 106 10/02/2019   CO2 28 10/02/2019   GLUCOSE 98 10/02/2019   BUN 16 10/02/2019   CREATININE 1.00 10/02/2019   CALCIUM 8.6 10/02/2019   GFRNONAA 95  10/02/2019   GFRAA 110 10/02/2019      Assessment and Plan   hiv disease = getting labs.continue on your meds  Proteinuria = continue on lisinopril. Has low bp so unable to titrate ace inhibitor. Will repeat ua and protein-alb ratio  Long term medicaiton management = will check cr

## 2020-01-04 LAB — URINALYSIS
Bilirubin Urine: NEGATIVE
Glucose, UA: NEGATIVE
Hgb urine dipstick: NEGATIVE
Ketones, ur: NEGATIVE
Leukocytes,Ua: NEGATIVE
Nitrite: NEGATIVE
Specific Gravity, Urine: 1.02 (ref 1.001–1.03)
pH: 6 (ref 5.0–8.0)

## 2020-01-04 LAB — MICROALBUMIN / CREATININE URINE RATIO
Creatinine, Urine: 237 mg/dL (ref 20–320)
Microalb Creat Ratio: 281 mcg/mg creat — ABNORMAL HIGH (ref <30)
Microalb, Ur: 66.5 mg/dL

## 2020-01-04 LAB — T-HELPER CELL (CD4) - (RCID CLINIC ONLY)
CD4 % Helper T Cell: 24 % — ABNORMAL LOW (ref 33–65)
CD4 T Cell Abs: 545 /uL (ref 400–1790)

## 2020-01-05 LAB — URINE CYTOLOGY ANCILLARY ONLY
Chlamydia: NEGATIVE
Comment: NEGATIVE
Comment: NORMAL
Neisseria Gonorrhea: NEGATIVE

## 2020-01-09 ENCOUNTER — Telehealth: Payer: Medicare Other | Admitting: Physician Assistant

## 2020-01-09 DIAGNOSIS — B9689 Other specified bacterial agents as the cause of diseases classified elsewhere: Secondary | ICD-10-CM

## 2020-01-09 DIAGNOSIS — J329 Chronic sinusitis, unspecified: Secondary | ICD-10-CM | POA: Diagnosis not present

## 2020-01-09 LAB — LIPID PANEL
Cholesterol: 169 mg/dL (ref <200)
HDL: 29 mg/dL — ABNORMAL LOW (ref >=40)
LDL Cholesterol (Calc): 117 mg/dL (calc) — ABNORMAL HIGH
Non-HDL Cholesterol (Calc): 140 mg/dL (calc) — ABNORMAL HIGH (ref <130)
Total CHOL/HDL Ratio: 5.8 (calc) — ABNORMAL HIGH (ref <5.0)
Triglycerides: 121 mg/dL (ref <150)

## 2020-01-09 LAB — CBC WITH DIFFERENTIAL/PLATELET
Absolute Monocytes: 582 cells/uL (ref 200–950)
Basophils Absolute: 21 cells/uL (ref 0–200)
Basophils Relative: 0.3 %
Eosinophils Absolute: 50 cells/uL (ref 15–500)
Eosinophils Relative: 0.7 %
HCT: 38 % — ABNORMAL LOW (ref 38.5–50.0)
Hemoglobin: 13.2 g/dL (ref 13.2–17.1)
Lymphs Abs: 2336 cells/uL (ref 850–3900)
MCH: 32.9 pg (ref 27.0–33.0)
MCHC: 34.7 g/dL (ref 32.0–36.0)
MCV: 94.8 fL (ref 80.0–100.0)
MPV: 10.5 fL (ref 7.5–12.5)
Monocytes Relative: 8.2 %
Neutro Abs: 4111 cells/uL (ref 1500–7800)
Neutrophils Relative %: 57.9 %
Platelets: 246 10*3/uL (ref 140–400)
RBC: 4.01 10*6/uL — ABNORMAL LOW (ref 4.20–5.80)
RDW: 13.1 % (ref 11.0–15.0)
Total Lymphocyte: 32.9 %
WBC: 7.1 10*3/uL (ref 3.8–10.8)

## 2020-01-09 LAB — BASIC METABOLIC PANEL
BUN: 9 mg/dL (ref 7–25)
CO2: 26 mmol/L (ref 20–32)
Calcium: 8.9 mg/dL (ref 8.6–10.3)
Chloride: 104 mmol/L (ref 98–110)
Creat: 1.07 mg/dL (ref 0.60–1.35)
Glucose, Bld: 132 mg/dL — ABNORMAL HIGH (ref 65–99)
Potassium: 3.7 mmol/L (ref 3.5–5.3)
Sodium: 139 mmol/L (ref 135–146)

## 2020-01-09 LAB — FLUORESCENT TREPONEMAL AB(FTA)-IGG-BLD: Fluorescent Treponemal ABS: REACTIVE — AB

## 2020-01-09 LAB — RPR: RPR Ser Ql: REACTIVE — AB

## 2020-01-09 LAB — HIV-1 RNA QUANT-NO REFLEX-BLD
HIV 1 RNA Quant: 116 copies/mL — ABNORMAL HIGH
HIV-1 RNA Quant, Log: 2.06 Log copies/mL — ABNORMAL HIGH

## 2020-01-09 LAB — RPR TITER: RPR Titer: 1:32 {titer} — ABNORMAL HIGH

## 2020-01-09 MED ORDER — AMOXICILLIN-POT CLAVULANATE 875-125 MG PO TABS: 1.0000 | ORAL_TABLET | Freq: Two times a day (BID) | ORAL | 0 refills | Status: DC

## 2020-01-09 NOTE — Progress Notes (Signed)
We are sorry that you are not feeling well.  Here is how we plan to help!  Based on what you have shared with me it looks like you have sinusitis.  Sinusitis is inflammation and infection in the sinus cavities of the head.  Based on your presentation I believe you most likely have Acute Bacterial Sinusitis.  This is an infection caused by bacteria and is treated with antibiotics. I have prescribed Augmentin 875mg/125mg one tablet twice daily with food, for 7 days. You may use an oral decongestant such as Mucinex D or if you have glaucoma or high blood pressure use plain Mucinex. Saline nasal spray help and can safely be used as often as needed for congestion.  If you develop worsening sinus pain, fever or notice severe headache and vision changes, or if symptoms are not better after completion of antibiotic, please schedule an appointment with a health care provider.    Sinus infections are not as easily transmitted as other respiratory infection, however we still recommend that you avoid close contact with loved ones, especially the very young and elderly.  Remember to wash your hands thoroughly throughout the day as this is the number one way to prevent the spread of infection!  Home Care:  Only take medications as instructed by your medical team.  Complete the entire course of an antibiotic.  Do not take these medications with alcohol.  A steam or ultrasonic humidifier can help congestion.  You can place a towel over your head and breathe in the steam from hot water coming from a faucet.  Avoid close contacts especially the very young and the elderly.  Cover your mouth when you cough or sneeze.  Always remember to wash your hands.  Get Help Right Away If:  You develop worsening fever or sinus pain.  You develop a severe head ache or visual changes.  Your symptoms persist after you have completed your treatment plan.  Make sure you  Understand these instructions.  Will watch your  condition.  Will get help right away if you are not doing well or get worse.  Your e-visit answers were reviewed by a board certified advanced clinical practitioner to complete your personal care plan.  Depending on the condition, your plan could have included both over the counter or prescription medications.  If there is a problem please reply  once you have received a response from your provider.  Your safety is important to us.  If you have drug allergies check your prescription carefully.    You can use MyChart to ask questions about today's visit, request a non-urgent call back, or ask for a work or school excuse for 24 hours related to this e-Visit. If it has been greater than 24 hours you will need to follow up with your provider, or enter a new e-Visit to address those concerns.  You will get an e-mail in the next two days asking about your experience.  I hope that your e-visit has been valuable and will speed your recovery. Thank you for using e-visits.   5 minutes spent on this chart 

## 2020-01-10 ENCOUNTER — Other Ambulatory Visit: Payer: Self-pay | Admitting: Cardiovascular Disease

## 2020-01-10 MED ORDER — LISINOPRIL 2.5 MG PO TABS: ORAL_TABLET | ORAL | 0 refills | Status: DC

## 2020-01-16 ENCOUNTER — Telehealth: Payer: Self-pay

## 2020-01-16 NOTE — Telephone Encounter (Signed)
Scheduled at the HD on 01/22/20 at 10:30 for Bilcillin 2.4ML IM x3 weekly injections.  Brooks with [facility] has already spoken to patient and made aware of upcoming appointment. The following weekly injection will be scheduled upon appointment completion on 01/22/20. Routing to provider to make aware of syphilis treatment scheduled at HD. Valarie Cones

## 2020-02-09 ENCOUNTER — Other Ambulatory Visit: Payer: Self-pay | Admitting: Cardiovascular Disease

## 2020-02-09 MED ORDER — PRASUGREL HCL 10 MG PO TABS: 10.0000 mg | ORAL_TABLET | Freq: Every day | ORAL | 0 refills | Status: DC

## 2020-04-09 ENCOUNTER — Other Ambulatory Visit: Payer: Self-pay | Admitting: Cardiovascular Disease

## 2020-04-09 MED ORDER — LISINOPRIL 2.5 MG PO TABS: ORAL_TABLET | ORAL | 0 refills | Status: DC

## 2020-04-09 MED ORDER — METOPROLOL SUCCINATE ER 25 MG PO TB24: ORAL_TABLET | ORAL | 0 refills | Status: DC

## 2020-04-12 ENCOUNTER — Other Ambulatory Visit: Payer: Self-pay

## 2020-04-12 DIAGNOSIS — B20 Human immunodeficiency virus [HIV] disease: Secondary | ICD-10-CM

## 2020-04-12 MED ORDER — BIKTARVY 50-200-25 MG PO TABS: 1.0000 | ORAL_TABLET | Freq: Every day | ORAL | 2 refills | Status: DC

## 2020-04-12 MED ORDER — CETIRIZINE HCL 10 MG PO TABS: 10.0000 mg | ORAL_TABLET | Freq: Every day | ORAL | 3 refills | Status: DC

## 2020-05-01 ENCOUNTER — Other Ambulatory Visit: Payer: Self-pay

## 2020-05-01 DIAGNOSIS — B20 Human immunodeficiency virus [HIV] disease: Secondary | ICD-10-CM

## 2020-05-01 MED ORDER — BIKTARVY 50-200-25 MG PO TABS: 1.0000 | ORAL_TABLET | Freq: Every day | ORAL | 2 refills | Status: DC

## 2020-05-01 MED ORDER — PREZCOBIX 800-150 MG PO TABS: ORAL_TABLET | ORAL | 2 refills | Status: DC

## 2020-05-02 ENCOUNTER — Other Ambulatory Visit: Payer: Self-pay

## 2020-05-02 NOTE — Telephone Encounter (Signed)
Patient calling to say he is having continued issues with [facility] on [address] providing his HAART medications on time. He has now switched to [facility] on [address].   He was without medications for three days.  I advised patient the best pharmacy for HIV medicaitons is [facility] on [address], they have medications in stock and would not  delay treatment.  He says it is further away from his home, I informed him of mail order and he stated he has nosey neighbors.    I was not able to resolve his issue, with the available options.  For now he has switched to [facility] on [address].  He was informed we have no control of retail pharmacies.     Laurell Josephs, RN

## 2020-05-13 ENCOUNTER — Other Ambulatory Visit: Payer: Self-pay

## 2020-05-13 MED ORDER — LISINOPRIL 2.5 MG PO TABS: ORAL_TABLET | ORAL | 0 refills | Status: DC

## 2020-06-27 ENCOUNTER — Other Ambulatory Visit: Payer: Self-pay

## 2020-06-27 ENCOUNTER — Telehealth: Payer: Self-pay | Admitting: Cardiovascular Disease

## 2020-06-27 MED ORDER — METOPROLOL SUCCINATE ER 25 MG PO TB24: ORAL_TABLET | ORAL | 0 refills | Status: DC

## 2020-06-27 MED ORDER — LISINOPRIL 2.5 MG PO TABS: ORAL_TABLET | ORAL | 2 refills | Status: DC

## 2020-06-27 MED ORDER — NITROGLYCERIN 0.4 MG SL SUBL: 0.4000 mg | SUBLINGUAL_TABLET | SUBLINGUAL | 0 refills | Status: AC | PRN

## 2020-06-27 MED ORDER — METOPROLOL SUCCINATE ER 25 MG PO TB24: ORAL_TABLET | ORAL | 2 refills | Status: DC

## 2020-06-27 NOTE — Telephone Encounter (Signed)
Left message to call back  

## 2020-06-27 NOTE — Telephone Encounter (Signed)
Patient returning call.

## 2020-06-27 NOTE — Telephone Encounter (Signed)
I spoke with patient and told him he should continue lisinopril.  Also needs another refill of metoprolol prior to appt with Dr Elease Hashimoto on 8/4. He would like 30 day supply.  He reports he does not need Prasugrel refilled prior to appointment.  Will send lisinopril and metoprolol refills to [facility] on [address].  Patient aware to keep appointment on 8/4 for further refills

## 2020-06-27 NOTE — Telephone Encounter (Signed)
Pt c/o medication issue:  1. Name of Medication: lisinopril (ZESTRIL) 2.5 MG tablet  2. How are you currently taking this medication (dosage and times per day)? As directed  3. Are you having a reaction (difficulty breathing--STAT)? no  4. What is your medication issue? Patient wants to know if Dr. Elease Hashimoto recommends he continue taking this medication. Please advise.

## 2020-07-03 ENCOUNTER — Encounter: Payer: Self-pay | Admitting: Internal Medicine

## 2020-07-03 ENCOUNTER — Other Ambulatory Visit: Payer: Self-pay

## 2020-07-03 ENCOUNTER — Ambulatory Visit (INDEPENDENT_AMBULATORY_CARE_PROVIDER_SITE_OTHER): Payer: Medicare Other | Admitting: Internal Medicine

## 2020-07-03 VITALS — BP 107/64 | HR 73 | Temp 98.2°F | Wt 132.0 lb

## 2020-07-03 DIAGNOSIS — R3989 Other symptoms and signs involving the genitourinary system: Secondary | ICD-10-CM | POA: Diagnosis not present

## 2020-07-03 DIAGNOSIS — Z8619 Personal history of other infectious and parasitic diseases: Secondary | ICD-10-CM

## 2020-07-03 DIAGNOSIS — Z79899 Other long term (current) drug therapy: Secondary | ICD-10-CM | POA: Diagnosis not present

## 2020-07-03 DIAGNOSIS — B20 Human immunodeficiency virus [HIV] disease: Secondary | ICD-10-CM

## 2020-07-03 NOTE — Progress Notes (Signed)
Patient ID: Rodney Walker, male   DOB: 06/17/1981, 39 y.o.   MRN: [id number]  HPI 38yo M with hiv disease,On biktarvy-prezcobix, CD 4 count of 545/VL 116. Hx of emphysema, remote hx of lung resection for lung abscess. He has started working out, more running of late. He notices some bladder pressure when he is running. He has stopped running due to those symptoms. Doesn't occur with walk. Usually occurs at 438m   Hx of syphilis noted in [place] and treated x 3 im injection.  Outpatient Encounter Medications as of 07/03/2020  Medication Sig  . aspirin EC 81 MG tablet Take 1 tablet (81 mg total) by mouth 2 (two) times daily.  . bictegravir-emtricitabine-tenofovir AF (BIKTARVY) 50-200-25 MG TABS tablet Take 1 tablet by mouth daily.  . cetirizine (ZYRTEC) 10 MG tablet Take 1 tablet (10 mg total) by mouth daily.  . darunavir-cobicistat (PREZCOBIX) 800-150 MG tablet TAKE 1 TABLET BY MOUTH DAILY WITH BREAKFAST. SWALLOW WHOLE. DO NOT CRUSH, BREAK OR CHEW TABLETS.TAKE WITH FOOD  . lisinopril (ZESTRIL) 2.5 MG tablet TAKE 1 TABLET(2.5 MG) BY MOUTH DAILY. Please keep appointment on 8/4 for further refills  . metoprolol succinate (TOPROL-XL) 25 MG 24 hr tablet TAKE 1 TABLET(25 MG) BY MOUTH DAILY. Please keep upcoming apt for future refills.  . nitroGLYCERIN (NITROSTAT) 0.4 MG SL tablet Place 1 tablet (0.4 mg total) under the tongue every 5 (five) minutes x 3 doses as needed for chest pain.  . prasugrel (EFFIENT) 10 MG TABS tablet Take 1 tablet (10 mg total) by mouth daily. Please make yearly appt with Dr. Elease Hashimoto for April before anymore refills. 1st attempt  . senna-docusate (SENOKOT S) 8.6-50 MG tablet Take 1-2 tablets by mouth at bedtime as needed.  Marland Kitchen amoxicillin-clavulanate (AUGMENTIN) 875-125 MG tablet Take 1 tablet by mouth 2 (two) times daily. (Patient not taking: Reported on 07/03/2020)  . cephALEXin (KEFLEX) 500 MG capsule Take 1 capsule (500 mg total) by mouth 4 (four) times daily.  (Patient not taking: Reported on 01/03/2020)  . meloxicam (MOBIC) 7.5 MG tablet Take 1 tablet (7.5 mg total) by mouth 2 (two) times daily as needed for pain. (Patient not taking: Reported on 07/03/2020)  . methocarbamol (ROBAXIN) 500 MG tablet Take 1 tablet (500 mg total) by mouth every 6 (six) hours as needed for muscle spasms. (Patient not taking: Reported on 07/03/2020)  . methocarbamol (ROBAXIN) 750 MG tablet Take 1 tablet (750 mg total) by mouth 2 (two) times daily as needed for muscle spasms. (Patient not taking: Reported on 07/03/2020)  . ondansetron (ZOFRAN) 4 MG tablet Take 1-2 tablets (4-8 mg total) by mouth every 8 (eight) hours as needed for nausea or vomiting. (Patient not taking: Reported on 07/03/2020)  . oxyCODONE (OXY IR/ROXICODONE) 5 MG immediate release tablet Take 1-2 tablets (5-10 mg total) by mouth every 8 (eight) hours as needed for severe pain. (Patient not taking: Reported on 07/03/2020)  . promethazine (PHENERGAN) 25 MG tablet Take 1 tablet (25 mg total) by mouth every 6 (six) hours as needed for nausea. (Patient not taking: Reported on 07/03/2020)   No facility-administered encounter medications on file as of 07/03/2020.     Patient Active Problem List   Diagnosis Date Noted  . Avascular necrosis of bone of right hip (H[facility]) 09/25/2019  . 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 Hyperlipidemia 04/05/2018  . Ischemic cardiomyopathy 04/05/2018  . History of acute inferior wall MI 03/24/2018  . Marijuana abuse, continuous 10/29/2016  .  Moderate persistent asthma without complication 10/29/2016  . Allergic rhinitis 10/08/2016  . Anemia 05/03/2015  . Lung abscess (H[facility]) 04/22/2015  . Sepsis due to pneumonia (H[facility]) 04/17/2015  . Bullous emphysema (H[facility])   . Lobar pneumonia (H[facility])   . Empyema lung (H[facility]) 04/16/2015  . HIV (human immunodeficiency virus infection) (H[facility])   . Tobacco abuse 03/21/2015  . Lung mass 08/31/2014  . 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 Myalgia  04/23/2014  . AIDS (H[facility]) 03/22/2014  . CAP (community acquired pneumonia) 03/22/2014  . CAD (coronary artery disease) 11/13/2011     Health Maintenance Due  Topic Date Due  . COVID-19 Vaccine (1) Never done     Review of Systems 12 point ros is negative except for what is mentioned in hpi Physical Exam   BP 107/64   Pulse 73   Temp 98.2 F (36.8 C) (Oral)   Wt 132 lb (59.9 kg)   BMI 22.66 kg/m   Physical Exam  Constitutional: He is oriented to person, place, and time. He appears well-developed and well-nourished. No distress.  HENT:  Mouth/Throat: Oropharynx is clear and moist. No oropharyngeal exudate.  Cardiovascular: Normal rate, regular rhythm and normal heart sounds. Exam reveals no gallop and no friction rub.  No murmur heard.  Pulmonary/Chest: Effort normal and breath sounds normal. No respiratory distress. He has no wheezes.  Abdominal: Soft. Bowel sounds are normal. He exhibits no distension. There is no tenderness.  Lymphadenopathy:  He has no cervical adenopathy.  Neurological: He is alert and oriented to person, place, and time.  Skin: Skin is warm and dry. No rash noted. No erythema.  Psychiatric: He has a normal mood and affect. His behavior is normal.    Lab Results  Component Value Date   CD4TCELL 24 (L) 01/03/2020   Lab Results  Component Value Date   CD4TABS 545 01/03/2020   CD4TABS 863 10/02/2019   CD4TABS 594 06/06/2019   Lab Results  Component Value Date   HIV1RNAQUANT 116 (H) 01/03/2020   No results found for: HEPBSAB Lab Results  Component Value Date   LABRPR REACTIVE (A) 01/03/2020    CBC Lab Results  Component Value Date   WBC 7.1 01/03/2020   RBC 4.01 (L) 01/03/2020   HGB 13.2 01/03/2020   HCT 38.0 (L) 01/03/2020   PLT 246 01/03/2020   MCV 94.8 01/03/2020   MCH 32.9 01/03/2020   MCHC 34.7 01/03/2020   RDW 13.1 01/03/2020   LYMPHSABS 2,336 01/03/2020   MONOABS 0.7 09/19/2019   EOSABS 50 01/03/2020    BMET Lab Results   Component Value Date   NA 139 01/03/2020   K 3.7 01/03/2020   CL 104 01/03/2020   CO2 26 01/03/2020   GLUCOSE 132 (H) 01/03/2020   BUN 9 01/03/2020   CREATININE 1.07 01/03/2020   CALCIUM 8.9 01/03/2020   GFRNONAA 95 10/02/2019   GFRAA 110 10/02/2019      Assessment and Plan  Low level viremia = will get genotype to see if can get full suppression. Not missing any doses of hiv meds in last 30 days. Will need to synchronize his meds. Will check VL  Bladder pressure with exercise = refer to urology to see if you need referral pelvic floor exercise  covid-19 vaccine =  Got it at [facility] [place] - [employer] - both doses. /will check in the state system. So that we can mark in his vaccine records  Long term medication management = will check cr  Hx  of syphilis = will check RPR

## 2020-07-04 LAB — URINALYSIS, ROUTINE W REFLEX MICROSCOPIC
Bacteria, UA: NONE SEEN /HPF
Bilirubin Urine: NEGATIVE
Crystals: NONE SEEN /HPF
Glucose, UA: NEGATIVE
Hgb urine dipstick: NEGATIVE
Hyaline Cast: NONE SEEN /LPF
Ketones, ur: NEGATIVE
Leukocytes,Ua: NEGATIVE
Nitrite: NEGATIVE
RBC / HPF: NONE SEEN /HPF (ref 0–2)
Specific Gravity, Urine: 1.021 (ref 1.001–1.03)
Squamous Epithelial / HPF: NONE SEEN /HPF (ref <=5)
pH: 6 (ref 5.0–8.0)

## 2020-07-04 LAB — MICROSCOPIC MESSAGE

## 2020-07-04 LAB — T-HELPER CELL (CD4) - (RCID CLINIC ONLY)
CD4 % Helper T Cell: 25 % — ABNORMAL LOW (ref 33–65)
CD4 T Cell Abs: 762 /uL (ref 400–1790)

## 2020-07-06 LAB — HIV-1 RNA ULTRAQUANT REFLEX TO GENTYP+
HIV 1 RNA Quant: 78 copies/mL — ABNORMAL HIGH
HIV-1 RNA Quant, Log: 1.89 Log copies/mL — ABNORMAL HIGH

## 2020-07-06 LAB — RPR: RPR Ser Ql: REACTIVE — AB

## 2020-07-06 LAB — RPR TITER: RPR Titer: 1:1 {titer} — ABNORMAL HIGH

## 2020-07-06 LAB — FLUORESCENT TREPONEMAL AB(FTA)-IGG-BLD: Fluorescent Treponemal ABS: REACTIVE — AB

## 2020-07-31 ENCOUNTER — Other Ambulatory Visit: Payer: Self-pay

## 2020-07-31 ENCOUNTER — Ambulatory Visit (INDEPENDENT_AMBULATORY_CARE_PROVIDER_SITE_OTHER): Payer: Medicare Other | Admitting: Cardiovascular Disease

## 2020-07-31 ENCOUNTER — Encounter: Payer: Self-pay | Admitting: Cardiovascular Disease

## 2020-07-31 VITALS — BP 104/64 | HR 60 | Ht 64.0 in | Wt 133.8 lb

## 2020-07-31 DIAGNOSIS — I2510 Atherosclerotic heart disease of native coronary artery without angina pectoris: Secondary | ICD-10-CM

## 2020-07-31 NOTE — Patient Instructions (Signed)
Medication Instructions:  Your physician has recommended you make the following change in your medication:  1) STOP taking lisinopril  *If you need a refill on your cardiac medications before your next appointment, please call your pharmacy*  Follow-Up: At [facility], you and your health needs are our priority.  As part of our continuing mission to provide you with exceptional heart care, we have created designated Provider Care Teams.  These Care Teams include your primary Cardiologist (physician) and Advanced Practice Providers (APPs -  Physician Assistants and Nurse Practitioners) who all work together to provide you with the care you need, when you need it.  Your next appointment:   1 year(s)  The format for your next appointment:   In Person  Provider:   Tereso Newcomer, PA-C

## 2020-07-31 NOTE — Progress Notes (Signed)
Rodney Walker Date of Birth  12-12-1981 [facility] [address]    [address] [place], [place]  [address] [phone]  Fax  [phone]    Rodney Walker is a 39 y.o.  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  He was on Plavix for a year.  He is now on Aspirin but admits to not taking it regularly.    He remains active,  He works at [employer].  He does not get any regular aerobic exercise.  November 11, 2018:  Rodney Walker is seen back today for a follow-up visit.  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  Hx of HIV, ashtma   Is admitted to the hospital in March, 2019 with an inferior wall ST segment elevation myocardial infarction.  He had atherectomy and then followed by drug-eluting stent to the proximal RCA.  He is done well.  He is not had any episodes of chest pain or shortness of breath.  He needs to have hip surgery  He is at low risk for this hip surgery  He may hold Effient for 7 days prior to the procedure. I would prefer that he continue the ASA during this time  July 31, 2020: Rodney Walker is seen today for follow-up visit.  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 Is back exercising and doing .  Walks every day .  No CP    Current Outpatient Medications on File Prior to Visit  Medication Sig Dispense Refill  . aspirin EC 81 MG tablet Take 1 tablet (81 mg total) by mouth 2 (two) times daily. 84 tablet 0  . bictegravir-emtricitabine-tenofovir AF (BIKTARVY) 50-200-25 MG TABS tablet Take 1 tablet by mouth daily. 30 tablet 2  . cetirizine (ZYRTEC) 10 MG tablet Take 1 tablet (10 mg total) by mouth daily. 30 tablet 3  . darunavir-cobicistat (PREZCOBIX) 800-150 MG tablet TAKE 1 TABLET BY MOUTH DAILY WITH BREAKFAST. SWALLOW WHOLE. DO NOT  CRUSH, BREAK OR CHEW TABLETS.TAKE WITH FOOD 30 tablet 2  . lisinopril (ZESTRIL) 2.5 MG tablet TAKE 1 TABLET(2.5 MG) BY MOUTH DAILY. Please keep appointment on 8/4 for further refills 30 tablet 2  . metoprolol succinate (TOPROL-XL) 25 MG 24 hr tablet TAKE 1 TABLET(25 MG) BY MOUTH DAILY. Please keep upcoming apt for future refills. 30 tablet 2  . nitroGLYCERIN (NITROSTAT) 0.4 MG SL tablet Place 1 tablet (0.4 mg total) under the tongue every 5 (five) minutes x 3 doses as needed for chest pain. 25 tablet 0  . prasugrel (EFFIENT) 10 MG TABS tablet Take 1 tablet (10 mg total) by mouth daily. Please make yearly appt with Dr. Elease Hashimoto for April before anymore refills. 1st attempt 90 tablet 0   No current facility-administered medications on file prior to visit.    No Known Allergies  Past Medical History:  Diagnosis Date  . Anemia   . COPD (chronic obstructive pulmonary disease) (HCC)   . Coronary artery disease    MI 2010 - dissection/plaque rupture in LAD tx with antiplt/antithrombotics >> relook cath with resolution (no PCI performed) // Inf STEMI 3/19: OM1 30; pRCA 100 >> DES // Echo 3/19:  mild LVH, EF 40-45, inf/inf-sept HK, trivial MR  . Eczema   . History of myocardial infarction 2010; 2019  . History of pleural  empyema   . HIV infection (HCC)   . Hyperlipidemia   . Immune deficiency disorder (HCC)   . Ischemic cardiomyopathy 04/05/2018  . Moderate persistent asthma without complication 10/29/2016  . Myocardial infarction (HCC)    2009, 2019  . Pneumonia     Past Surgical History:  Procedure Laterality Date  . CARDIAC CATHETERIZATION  06/24/2009   EF 60%  . CARDIAC CATHETERIZATION  06/17/2009   EF 45%. ANTERIOR HYPOKINESIS  . CORONARY STENT INTERVENTION N/A 03/24/2018   Procedure: CORONARY STENT INTERVENTION;  Surgeon: Tonny Bollman, MD;  Location: [facility] INVASIVE CV LAB;  Service: Cardiovascular;  Laterality: N/A;  . CORONARY/GRAFT ACUTE MI REVASCULARIZATION N/A 03/24/2018   Procedure:  Coronary/Graft Acute MI Revascularization;  Surgeon: Tonny Bollman, MD;  Location: [facility] INVASIVE CV LAB;  Service: Cardiovascular;  Laterality: N/A;  . LEFT HEART CATH AND CORONARY ANGIOGRAPHY N/A 03/24/2018   Procedure: LEFT HEART CATH AND CORONARY ANGIOGRAPHY;  Surgeon: Tonny Bollman, MD;  Location: [facility] INVASIVE CV LAB;  Service: Cardiovascular;  Laterality: N/A;  . TOTAL HIP ARTHROPLASTY Right 09/25/2019   Procedure: RIGHT TOTAL HIP ARTHROPLASTY ANTERIOR APPROACH;  Surgeon: Tarry Kos, MD;  Location: [facility] OR;  Service: Orthopedics;  Laterality: Right;  . US ECHOCARDIOGRAPHY  12/09/2009   EF 55-60%  . VIDEO ASSISTED THORACOSCOPY (VATS)/ LOBECTOMY Right 04/23/2015   Procedure: VIDEO ASSISTED THORACOSCOPY (VATS)/RIGHT upper  and right middle LOBECTOMY;  Surgeon: Kerin Perna, MD;  Location: [facility] OR;  Service: Thoracic;  Laterality: Right;  Marland Kitchen VIDEO BRONCHOSCOPY Bilateral 09/05/2014   Procedure: VIDEO BRONCHOSCOPY WITH FLUORO;  Surgeon: Merwyn Katos, MD;  Location: [facility] ENDOSCOPY;  Service: Cardiopulmonary;  Laterality: Bilateral;  . VIDEO BRONCHOSCOPY Bilateral 04/10/2015   Procedure: VIDEO BRONCHOSCOPY WITH FLUORO;  Surgeon: Lupita Leash, MD;  Location: [facility] ENDOSCOPY;  Service: Cardiopulmonary;  Laterality: Bilateral;    Social History   Tobacco Use  Smoking Status Former Smoker  . Packs/day: 0.25  . Years: 17.00  . Pack years: 4.25  . Types: Cigarettes, Cigars  . Quit date: 03/23/2015  . Years since quitting: 5.3  Smokeless Tobacco Never Used    Social History   Substance and Sexual Activity  Alcohol Use No  . Alcohol/week: 0.0 standard drinks    Family History  Problem Relation Age of Onset  . Diabetes Mother   . Eczema Mother   . Asthma Mother   . COPD Mother   . Hypertension Mother   . Hyperlipidemia Mother   . Stroke Mother   . Heart disease Mother   . Sleep apnea Mother   . Liver cancer Father   . Asthma Brother     Reviw of Systems:  Reviewed in the HPI.   All other systems are negative.  Physical Exam: Blood pressure 104/64, pulse 60, height 5\' 4"  (1.626 m), weight 133 lb 12.8 oz (60.7 kg), SpO2 96 %.  GEN:  Well nourished, well developed in no acute distress HEENT: Normal NECK: No JVD; No carotid bruits LYMPHATICS: No lymphadenopathy CARDIAC: RRR , no murmurs, rubs, gallops RESPIRATORY:  Clear to auscultation without rales, wheezing or rhonchi  ABDOMEN: Soft, non-tender, non-distended MUSCULOSKELETAL:  No edema; No deformity  SKIN: Warm and dry NEUROLOGIC:  Alert and oriented x 3    ECG:  Aug. 4, 2021:  NSR at 60.   Early repol .  Normal ecg     Assessment / Plan:   1.  CAD: 11-13-1994 is doing well.  He has had 2 separate myocardial infarctions  each 1 in different vessels.  In his last procedure Dr. Excell Seltzer suggested that we continue with long-term dual Antley platelet therapy as long as is tolerated.  We will continue aspirin and prasugrel.  He was tried on Plavix previously but this interfered with some of his HIV medicines.  He doesn't have significant atherosclerosis.   His MI appears to be due to SCAD .   2. Right hip avascular necrosis: He status post hip replacement.  He seems to be doing well.     Kristeen Miss, MD  07/31/2020 10:15 AM    [facility] [address] [place],  [address] [place], [place]  [address] Phone: [phone]; Fax: [phone]

## 2020-08-07 ENCOUNTER — Other Ambulatory Visit: Payer: Self-pay

## 2020-08-07 MED ORDER — PRASUGREL HCL 10 MG PO TABS: 10.0000 mg | ORAL_TABLET | Freq: Every day | ORAL | 3 refills | Status: DC

## 2020-08-31 ENCOUNTER — Encounter (HOSPITAL_COMMUNITY): Payer: Self-pay | Admitting: Gynecology

## 2020-08-31 ENCOUNTER — Ambulatory Visit (INDEPENDENT_AMBULATORY_CARE_PROVIDER_SITE_OTHER): Payer: Medicare Other

## 2020-08-31 ENCOUNTER — Other Ambulatory Visit: Payer: Self-pay

## 2020-08-31 ENCOUNTER — Ambulatory Visit (HOSPITAL_COMMUNITY)
Admission: EM | Admit: 2020-08-31 | Discharge: 2020-08-31 | Disposition: A | Discharge status: Home / Self Care | Payer: Medicare Other | Attending: Emergency Medicine | Admitting: Emergency Medicine

## 2020-08-31 DIAGNOSIS — R0789 Other chest pain: Secondary | ICD-10-CM

## 2020-08-31 DIAGNOSIS — K649 Unspecified hemorrhoids: Secondary | ICD-10-CM | POA: Diagnosis present

## 2020-08-31 DIAGNOSIS — K625 Hemorrhage of anus and rectum: Secondary | ICD-10-CM | POA: Diagnosis present

## 2020-08-31 DIAGNOSIS — R634 Abnormal weight loss: Secondary | ICD-10-CM | POA: Insufficient documentation

## 2020-08-31 LAB — BASIC METABOLIC PANEL
Anion gap: 11 (ref 5–15)
BUN: 25 mg/dL — ABNORMAL HIGH (ref 6–20)
CO2: 27 mmol/L (ref 22–32)
Calcium: 9.7 mg/dL (ref 8.9–10.3)
Chloride: 95 mmol/L — ABNORMAL LOW (ref 98–111)
Creatinine, Ser: 1.94 mg/dL — ABNORMAL HIGH (ref 0.61–1.24)
GFR calc Af Amer: 49 mL/min — ABNORMAL LOW (ref >60)
GFR calc non Af Amer: 42 mL/min — ABNORMAL LOW (ref >60)
Glucose, Bld: 107 mg/dL — ABNORMAL HIGH (ref 70–99)
Potassium: 4.3 mmol/L (ref 3.5–5.1)
Sodium: 133 mmol/L — ABNORMAL LOW (ref 135–145)

## 2020-08-31 LAB — CBC
HCT: 47.5 % (ref 39.0–52.0)
Hemoglobin: 17.7 g/dL — ABNORMAL HIGH (ref 13.0–17.0)
MCH: 33.7 pg (ref 26.0–34.0)
MCHC: 37.3 g/dL — ABNORMAL HIGH (ref 30.0–36.0)
MCV: 90.3 fL (ref 80.0–100.0)
Platelets: 246 10*3/uL (ref 150–400)
RBC: 5.26 MIL/uL (ref 4.22–5.81)
RDW: 12.8 % (ref 11.5–15.5)
WBC: 10.2 10*3/uL (ref 4.0–10.5)
nRBC: 0 % (ref 0.0–0.2)

## 2020-08-31 NOTE — Discharge Instructions (Addendum)
Your xray is well appearing today which is reassuring.  You may continue to use preparation h as needed for itching.  Plenty of water and fiber in your diet to promote regular stools.  Your chest looks normal.  You may use tylenol or motrin as needed for chest wall pain.  I would like you to follow up with your primary care provider for recheck especially in regards to your weight loss, and you may need colonoscopy for further evaluation of your rectal bleeding.  I would call you if you have any concerning lab findings.

## 2020-08-31 NOTE — ED Provider Notes (Signed)
[facility]    CSN: [id number] Arrival date & time: 08/31/20  1542      History   Chief Complaint Chief Complaint  Patient presents with  . Hemorrhoids  . rib pain    right side    HPI Rodney Walker is a 39 y.o. male.   Rodney Walker presents with complaints of right chest wall pain which has been intermittent for the past two weeks. Had a URI a week or two prior to onset, this has since resolved. The sensation will come and go, he can feel the spasm to his chest wall. History of right lobectomy? No specific known trigger to this sensation. No current pain to chest wall. No shortness of breath. Primarily here, however, with complaints of rectal bleeding. Notes with wiping he visualizes some blood on tissue, for the past week. No pelvic pain. Sense of needing to pass a stool, but unable to pass. Did pass stool today, however. Does have to strain. No fevers. No abdominal pain. No nausea or vomiting. Denies any previous similar. hiv positive. Takes his hiv medications regularly. Last rectal intercourse a month ago.  Follows with I/D. States he has had a 10lb weight loss over the past month. Endorses life stressors currently, and anxiety. Has been using preparation h for hemorrhoids. Endorses some rectal itching. No previous colonoscopy. No known family history of colon cancer. No dizziness.    ROS per HPI, negative if not otherwise mentioned.      Past Medical History:  Diagnosis Date  . Anemia   . COPD (chronic obstructive pulmonary disease) (HCC)   . Coronary artery disease    MI 2010 - dissection/plaque rupture in LAD tx with antiplt/antithrombotics >> relook cath with resolution (no PCI performed) // Inf STEMI 3/19: OM1 30; pRCA 100 >> DES // Echo 3/19:  mild LVH, EF 40-45, inf/inf-sept HK, trivial MR  . Eczema   . History of myocardial infarction 2010; 2019  . History of pleural empyema   . HIV infection (HCC)   . Hyperlipidemia   . Immune  deficiency disorder (HCC)   . Ischemic cardiomyopathy 04/05/2018  . Moderate persistent asthma without complication 10/29/2016  . Myocardial infarction (HCC)    2009, 2019  . Pneumonia     Patient Active Problem List   Diagnosis Date Noted  . Avascular necrosis of bone of right hip (HCC) 09/25/2019  . 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 Hyperlipidemia 04/05/2018  . Ischemic cardiomyopathy 04/05/2018  . History of acute inferior wall MI 03/24/2018  . Marijuana abuse, continuous 10/29/2016  . Moderate persistent asthma without complication 10/29/2016  . Allergic rhinitis 10/08/2016  . Anemia 05/03/2015  . Lung abscess (HCC) 04/22/2015  . Sepsis due to pneumonia (HCC) 04/17/2015  . Bullous emphysema (HCC)   . Lobar pneumonia (HCC)   . Empyema lung (HCC) 04/16/2015  . HIV (human immunodeficiency virus infection) (HCC)   . Tobacco abuse 03/21/2015  . Lung mass 08/31/2014  . 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 Myalgia 04/23/2014  . AIDS (HCC) 03/22/2014  . CAP (community acquired pneumonia) 03/22/2014  . CAD (coronary artery disease) 11/13/2011    Past Surgical History:  Procedure Laterality Date  . CARDIAC CATHETERIZATION  06/24/2009   EF 60%  . CARDIAC CATHETERIZATION  06/17/2009   EF 45%. ANTERIOR HYPOKINESIS  . CORONARY STENT INTERVENTION N/A 03/24/2018   Procedure: CORONARY STENT INTERVENTION;  Surgeon: Tonny Bollman, MD;  Location: [facility] INVASIVE CV LAB;  Service:  Cardiovascular;  Laterality: N/A;  . CORONARY/GRAFT ACUTE MI REVASCULARIZATION N/A 03/24/2018   Procedure: Coronary/Graft Acute MI Revascularization;  Surgeon: Tonny Bollman, MD;  Location: [facility] INVASIVE CV LAB;  Service: Cardiovascular;  Laterality: N/A;  . LEFT HEART CATH AND CORONARY ANGIOGRAPHY N/A 03/24/2018   Procedure: LEFT HEART CATH AND CORONARY ANGIOGRAPHY;  Surgeon: Tonny Bollman, MD;  Location: [facility] INVASIVE CV LAB;  Service: Cardiovascular;  Laterality: N/A;  . TOTAL HIP  ARTHROPLASTY Right 09/25/2019   Procedure: RIGHT TOTAL HIP ARTHROPLASTY ANTERIOR APPROACH;  Surgeon: Tarry Kos, MD;  Location: [facility] OR;  Service: Orthopedics;  Laterality: Right;  . US ECHOCARDIOGRAPHY  12/09/2009   EF 55-60%  . VIDEO ASSISTED THORACOSCOPY (VATS)/ LOBECTOMY Right 04/23/2015   Procedure: VIDEO ASSISTED THORACOSCOPY (VATS)/RIGHT upper  and right middle LOBECTOMY;  Surgeon: Kerin Perna, MD;  Location: [facility] OR;  Service: Thoracic;  Laterality: Right;  Marland Kitchen VIDEO BRONCHOSCOPY Bilateral 09/05/2014   Procedure: VIDEO BRONCHOSCOPY WITH FLUORO;  Surgeon: Merwyn Katos, MD;  Location: [facility] ENDOSCOPY;  Service: Cardiopulmonary;  Laterality: Bilateral;  . VIDEO BRONCHOSCOPY Bilateral 04/10/2015   Procedure: VIDEO BRONCHOSCOPY WITH FLUORO;  Surgeon: Lupita Leash, MD;  Location: [facility] ENDOSCOPY;  Service: Cardiopulmonary;  Laterality: Bilateral;       Home Medications    Prior to Admission medications   Medication Sig Start Date End Date Taking? Authorizing Provider  aspirin EC 81 MG tablet Take 1 tablet (81 mg total) by mouth 2 (two) times daily. 09/25/19  Yes Tarry Kos, MD  bictegravir-emtricitabine-tenofovir AF (BIKTARVY) 50-200-25 MG TABS tablet Take 1 tablet by mouth daily. 05/01/20  Yes Judyann Munson, MD  cetirizine (ZYRTEC) 10 MG tablet Take 1 tablet (10 mg total) by mouth daily. 04/12/20  Yes Judyann Munson, MD  darunavir-cobicistat (PREZCOBIX) 800-150 MG tablet TAKE 1 TABLET BY MOUTH DAILY WITH BREAKFAST. SWALLOW WHOLE. DO NOT CRUSH, BREAK OR CHEW TABLETS.TAKE WITH FOOD 05/01/20  Yes Judyann Munson, MD  metoprolol succinate (TOPROL-XL) 25 MG 24 hr tablet TAKE 1 TABLET(25 MG) BY MOUTH DAILY. Please keep upcoming apt for future refills. 06/27/20  Yes [name], [name], MD  nitroGLYCERIN (NITROSTAT) 0.4 MG SL tablet Place 1 tablet (0.4 mg total) under the tongue every 5 (five) minutes x 3 doses as needed for chest pain. 06/27/20  Yes [name], [name], MD  prasugrel (EFFIENT) 10 MG TABS  tablet Take 1 tablet (10 mg total) by mouth daily. 08/07/20  Yes [name], [name], MD    Family History Family History  Problem Relation Age of Onset  . Diabetes Mother   . Eczema Mother   . Asthma Mother   . COPD Mother   . Hypertension Mother   . Hyperlipidemia Mother   . Stroke Mother   . Heart disease Mother   . Sleep apnea Mother   . Liver cancer Father   . Asthma Brother     Social History Social History   Tobacco Use  . Smoking status: Former Smoker    Packs/day: 0.25    Years: 17.00    Pack years: 4.25    Types: Cigarettes, Cigars    Quit date: 03/23/2015    Years since quitting: 5.4  . Smokeless tobacco: Never Used  Vaping Use  . Vaping Use: Never used  Substance Use Topics  . Alcohol use: No    Alcohol/week: 0.0 standard drinks  . Drug use: Yes    Frequency: 7.0 times per week    Types: Marijuana     Allergies  Patient has no known allergies.   Review of Systems Review of Systems   Physical Exam Triage Vital Signs ED Triage Vitals [08/31/20 1830]  Enc Vitals Group     BP 131/84     Pulse Rate 77     Resp (!) 79     Temp 99.3 F (37.4 C)     Temp Source Oral     SpO2 98 %     Weight 132 lb (59.9 kg)     Height 5\' 4"  (1.626 m)     Head Circumference      Peak Flow      Pain Score 3     Pain Loc      Pain Edu?      Excl. in GC?    No data found.  Updated Vital Signs BP 131/84 (BP Location: Left Arm)   Pulse 77   Temp 99.3 F (37.4 C) (Oral)   Resp 19   Ht 5\' 4"  (1.626 m)   Wt 122 lb (55.3 kg)   SpO2 98%   BMI 20.94 kg/m   Visual Acuity Right Eye Distance:   Left Eye Distance:   Bilateral Distance:    Right Eye Near:   Left Eye Near:    Bilateral Near:     Physical Exam Constitutional:      Appearance: He is well-developed.  Cardiovascular:     Rate and Rhythm: Normal rate.  Pulmonary:     Effort: Pulmonary effort is normal.  Chest:     Chest wall: No deformity, swelling or tenderness.  Abdominal:      Tenderness: There is no abdominal tenderness.  Genitourinary:    Rectum: External hemorrhoid present.     Comments: Small soft external hemorrhoid, no gross blood with DRE and otherwise benign exam  Skin:    General: Skin is warm and dry.  Neurological:     Mental Status: He is alert and oriented to person, place, and time.      UC Treatments / Results  Labs (all labs ordered are listed, but only abnormal results are displayed) Labs Reviewed  CBC - Abnormal; Notable for the following components:      Result Value   Hemoglobin 17.7 (*)    MCHC 37.3 (*)    All other components within normal limits  BASIC METABOLIC PANEL - Abnormal; Notable for the following components:   Sodium 133 (*)    Chloride 95 (*)    Glucose, Bld 107 (*)    BUN 25 (*)    Creatinine, Ser 1.94 (*)    GFR calc non Af Amer 42 (*)    GFR calc Af Amer 49 (*)    All other components within normal limits    EKG   Radiology DG Abd Acute W/Chest  Result Date: 08/31/2020 CLINICAL DATA:  Right chest wall pain. Constipation, rule out obstruction. EXAM: DG ABDOMEN ACUTE W/ 1V CHEST COMPARISON:  Most recent radiograph chest 03/24/2018. FINDINGS: Volume loss in the right hemithorax likely postsurgical with clips at the hilum. Emphysema with hyperinflation. There is peribronchial thickening. Heart is normal in size. No acute airspace disease. Remote right rib fracture. No bowel dilatation to suggest obstruction. Small volume of stool in the ascending and transverse colon. Air-filled splenic flexure and proximal descending colon. No significant formed stool distally. No abnormal rectal distention. No radiopaque calculi or abnormal soft tissue calcifications. Right hip arthroplasty. No acute osseous abnormalities are seen. IMPRESSION: 1. No evidence of bowel obstruction. Small  volume of colonic stool, stool burden not suggestive of constipation. 2. Postsurgical change in the right hemithorax. Emphysema and peribronchial  thickening. No acute chest finding. Electronically Signed   By: Narda Rutherford M.D.   On: 08/31/2020 19:46    Procedures Procedures (including critical care time)  Medications Ordered in UC Medications - No data to display  Initial Impression / Assessment and Plan / UC Course  I have reviewed the triage vital signs and the nursing notes.  Pertinent labs & imaging results that were available during my care of the patient were reviewed by me and considered in my medical decision making (see chart for details).     Non toxic. Vitals stable. Chest abdominal film without acute findings here today. External hemorrhoids present. Suspect internal hemorrhoids as source of bleeding, but emphasized need for follow up and likely colonoscopy. Supportive cares recommended. Cbc stable. creatinine and gfr are abnormal for him today. Will notify patient with emphasis for recheck of these labs and symptoms. Return precautions provided. Patient verbalized understanding and agreeable to plan.   Final Clinical Impressions(s) / UC Diagnoses   Final diagnoses:  Rectal bleeding  Hemorrhoids, unspecified hemorrhoid type  Chest wall pain  Loss of weight     Discharge Instructions     Your xray is well appearing today which is reassuring.  You may continue to use preparation h as needed for itching.  Plenty of water and fiber in your diet to promote regular stools.  Your chest looks normal.  You may use tylenol or motrin as needed for chest wall pain.  I would like you to follow up with your primary care provider for recheck especially in regards to your weight loss, and you may need colonoscopy for further evaluation of your rectal bleeding.  I would call you if you have any concerning lab findings.     ED Prescriptions    None     PDMP not reviewed this encounter.   Georgetta Haber, NP 09/01/20 1020

## 2020-08-31 NOTE — ED Triage Notes (Signed)
Patient c/o right flank pain from previous surgery, Patient also c/o of Hemorrhoids x 1 week ago.

## 2020-08-31 NOTE — ED Triage Notes (Signed)
Pt presents with c/o possible hemorrhoids. He states he has had some bright red blood after BMs since last week. Pt denies any issues with constipation as he uses Fleet enemas regularly. Pt also reports recent unintentional weight loss. Pt does report he sometimes feels he has to strain to have a BM. Pt states he has not been eating much. Pt also reports pain in his right rib area with some muscle spasms. Pt had right lobectomy between 3-5 years ago. Pt reports muscle spasms in his right rib area.

## 2020-09-03 ENCOUNTER — Other Ambulatory Visit: Payer: Self-pay | Admitting: Internal Medicine

## 2020-09-11 ENCOUNTER — Telehealth: Payer: Self-pay

## 2020-09-11 NOTE — Telephone Encounter (Signed)
Received call from patient. States he would like an appointment to be seen sooner to follow up on recent ED visit for Hemorids. Patient states he has been using preparationH and has had some relief, but still noted with swelling/itching. Patient scheduled for follow up visit with Dr. Feliz Beam partner Rexene Alberts for follow up. Valarie Cones

## 2020-09-11 NOTE — Telephone Encounter (Signed)
I am happy to send him in some prescription suppositories and save him another doctor's visit if he is comfortable with that and nothing has worsened. I see the Urgent Care visit recently and I would be comfortable with that plan.   If you could help me figure out where to send I will send in the script for him.

## 2020-09-12 NOTE — Telephone Encounter (Signed)
Spoke with patient over the phone, he would rather keep his appointment and be seen. Valarie Cones

## 2020-09-16 ENCOUNTER — Telehealth: Payer: Self-pay

## 2020-09-16 ENCOUNTER — Ambulatory Visit: Payer: Medicare Other | Admitting: Infectious Diseases

## 2020-09-16 NOTE — Telephone Encounter (Signed)
Spoke with the patient and confirmed that he is currently awaiting results of a COVID test due to a cough. Per Rexene Alberts, NP we will have patient reschedule his appointment once he tests negative for COVID.   Sandie Ano, RN

## 2020-09-18 ENCOUNTER — Ambulatory Visit (HOSPITAL_COMMUNITY)
Admission: EM | Admit: 2020-09-18 | Discharge: 2020-09-18 | Disposition: A | Discharge status: Home / Self Care | Payer: Medicare Other | Attending: Family Medicine | Admitting: Family Medicine

## 2020-09-18 ENCOUNTER — Encounter (HOSPITAL_COMMUNITY): Payer: Self-pay | Admitting: *Deleted

## 2020-09-18 ENCOUNTER — Other Ambulatory Visit: Payer: Self-pay

## 2020-09-18 ENCOUNTER — Ambulatory Visit (INDEPENDENT_AMBULATORY_CARE_PROVIDER_SITE_OTHER): Payer: Medicare Other

## 2020-09-18 DIAGNOSIS — R0602 Shortness of breath: Secondary | ICD-10-CM | POA: Diagnosis not present

## 2020-09-18 DIAGNOSIS — R509 Fever, unspecified: Secondary | ICD-10-CM | POA: Diagnosis not present

## 2020-09-18 DIAGNOSIS — K644 Residual hemorrhoidal skin tags: Secondary | ICD-10-CM

## 2020-09-18 DIAGNOSIS — H6982 Other specified disorders of Eustachian tube, left ear: Secondary | ICD-10-CM

## 2020-09-18 DIAGNOSIS — M8448XD Pathological fracture, other site, subsequent encounter for fracture with routine healing: Secondary | ICD-10-CM | POA: Diagnosis not present

## 2020-09-18 DIAGNOSIS — H6992 Unspecified Eustachian tube disorder, left ear: Secondary | ICD-10-CM

## 2020-09-18 DIAGNOSIS — R079 Chest pain, unspecified: Secondary | ICD-10-CM | POA: Diagnosis not present

## 2020-09-18 MED ORDER — HYDROCODONE-ACETAMINOPHEN 5-325 MG PO TABS: 1.0000 | ORAL_TABLET | Freq: Four times a day (QID) | ORAL | 0 refills | Status: DC | PRN

## 2020-09-18 NOTE — ED Triage Notes (Signed)
Pt reports he  was seen 08-31-20 for rib pain and still has same rib pain RT side . Rib pain increases with cough. Pt now has new onset Lt neck pain with Lt ear pressure  . Pt also wants his hemorrhoids check . Pt reports he still has bleeding from hemorrhoids. Pt feels like he has a fever with sweats .

## 2020-09-18 NOTE — Discharge Instructions (Addendum)
Healing rib fracture Hydrocodone for pain as needed Continue the allergy medicine The hemorrhoid appears to be healing See your doctor tomorrow You need to have your lab work rechecked.

## 2020-09-19 ENCOUNTER — Ambulatory Visit (INDEPENDENT_AMBULATORY_CARE_PROVIDER_SITE_OTHER): Payer: Medicare Other | Admitting: Infectious Diseases

## 2020-09-19 ENCOUNTER — Encounter: Payer: Self-pay | Admitting: Infectious Diseases

## 2020-09-19 VITALS — BP 101/66 | HR 88 | Temp 99.1°F | Ht 64.0 in | Wt 126.0 lb

## 2020-09-19 DIAGNOSIS — J439 Emphysema, unspecified: Secondary | ICD-10-CM | POA: Diagnosis not present

## 2020-09-19 DIAGNOSIS — Z23 Encounter for immunization: Secondary | ICD-10-CM

## 2020-09-19 DIAGNOSIS — Z21 Asymptomatic human immunodeficiency virus [HIV] infection status: Secondary | ICD-10-CM | POA: Diagnosis not present

## 2020-09-19 DIAGNOSIS — M8448XD Pathological fracture, other site, subsequent encounter for fracture with routine healing: Secondary | ICD-10-CM | POA: Diagnosis not present

## 2020-09-19 DIAGNOSIS — J301 Allergic rhinitis due to pollen: Secondary | ICD-10-CM

## 2020-09-19 DIAGNOSIS — K649 Unspecified hemorrhoids: Secondary | ICD-10-CM

## 2020-09-19 DIAGNOSIS — R079 Chest pain, unspecified: Secondary | ICD-10-CM

## 2020-09-19 MED ORDER — HYDROCORTISONE ACETATE 25 MG RE SUPP: 25.0000 mg | Freq: Two times a day (BID) | RECTAL | 0 refills | Status: DC

## 2020-09-19 MED ORDER — ALBUTEROL SULFATE HFA 108 (90 BASE) MCG/ACT IN AERS: 2.0000 | INHALATION_SPRAY | Freq: Four times a day (QID) | RESPIRATORY_TRACT | 2 refills | Status: DC | PRN

## 2020-09-19 NOTE — Progress Notes (Signed)
Name: Rodney Walker  DOB: February 09, 1981 MRN: [id number] PCP: Judyann Munson, MD    Patient Active Problem List   Diagnosis Date Noted  . Chest pain 09/30/2020  . Hemorrhoid 09/30/2020  . Avascular necrosis of bone of right hip (HCC) 09/25/2019  . 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 Hyperlipidemia 04/05/2018  . Ischemic cardiomyopathy 04/05/2018  . History of acute inferior wall MI 03/24/2018  . Marijuana abuse, continuous 10/29/2016  . Moderate persistent asthma without complication 10/29/2016  . Allergic rhinitis 10/08/2016  . Anemia 05/03/2015  . Lung abscess (HCC) 04/22/2015  . Bullous emphysema (HCC)   . Lobar pneumonia (HCC)   . Empyema lung (HCC) 04/16/2015  . HIV (human immunodeficiency virus infection) (HCC)   . Tobacco abuse 03/21/2015  . Lung mass 08/31/2014  . 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 Myalgia 04/23/2014  . AIDS (HCC) 03/22/2014  . CAP (community acquired pneumonia) 03/22/2014  . CAD (coronary artery disease) 11/13/2011      Subjective:   Chief Complaint  Patient presents with  . Follow-up    given condoms; c/o nightsweats      HPI:  Here with a few concerns today. Went for evaluation of hemorrhoids. Given supportive measures for care at first but was slow to improve. Was going to come here sooner but had to get a Covid test with coughing and night sweats symptoms he had recently.  He ultimately went back to the ER yesterday for reevaluation.  Hemorrhoids this problem has been improved however he has had increased pain in the right anterior ribs since early September.  During this time (at the beginning of September) he had weakness, fevers, headaches, cough.  He does have a history of right upper lobe resection for "infection they were unable to prove." The cough lately has improved but he does produce some thick green phlegm still. He has had some weight loss during this acute illness - about 15#. But has  regained a little since.  Feels like he is improving but worried about his rib fracture he sustained with severe coughing. COVID test was negative.    Review of Systems  Constitutional: Positive for appetite change, fatigue and unexpected weight change.  HENT: Positive for congestion and ear pain.   Respiratory: Negative for chest tightness and shortness of breath.   Cardiovascular: Positive for chest pain.  Gastrointestinal: Negative for abdominal distention and abdominal pain.  Genitourinary: Negative for difficulty urinating.  Musculoskeletal: Negative for back pain.  Skin: Negative for color change and rash.  Neurological: Negative for dizziness.  Psychiatric/Behavioral: Negative for sleep disturbance.     Past Medical History:  Diagnosis Date  . Anemia   . COPD (chronic obstructive pulmonary disease) (HCC)   . Coronary artery disease    MI 2010 - dissection/plaque rupture in LAD tx with antiplt/antithrombotics >> relook cath with resolution (no PCI performed) // Inf STEMI 3/19: OM1 30; pRCA 100 >> DES // Echo 3/19:  mild LVH, EF 40-45, inf/inf-sept HK, trivial MR  . Eczema   . History of myocardial infarction 2010; 2019  . History of pleural empyema   . HIV infection (HCC)   . Hyperlipidemia   . Immune deficiency disorder (HCC)   . Ischemic cardiomyopathy 04/05/2018  . Moderate persistent asthma without complication 10/29/2016  . Myocardial infarction (HCC)    2009, 2019  . Pneumonia     Outpatient Medications Prior to Visit  Medication Sig Dispense Refill  . aspirin EC 81 MG  tablet Take 1 tablet (81 mg total) by mouth 2 (two) times daily. 84 tablet 0  . bictegravir-emtricitabine-tenofovir AF (BIKTARVY) 50-200-25 MG TABS tablet Take 1 tablet by mouth daily. 30 tablet 2  . cetirizine (ZYRTEC) 10 MG tablet TAKE 1 TABLET(10 MG) BY MOUTH DAILY 30 tablet 3  . darunavir-cobicistat (PREZCOBIX) 800-150 MG tablet TAKE 1 TABLET BY MOUTH DAILY WITH BREAKFAST. SWALLOW WHOLE. DO NOT  CRUSH, BREAK OR CHEW TABLETS.TAKE WITH FOOD 30 tablet 2  . HYDROcodone-acetaminophen (NORCO/VICODIN) 5-325 MG tablet Take 1 tablet by mouth every 6 (six) hours as needed. 10 tablet 0  . metoprolol succinate (TOPROL-XL) 25 MG 24 hr tablet TAKE 1 TABLET(25 MG) BY MOUTH DAILY. Please keep upcoming apt for future refills. 30 tablet 2  . nitroGLYCERIN (NITROSTAT) 0.4 MG SL tablet Place 1 tablet (0.4 mg total) under the tongue every 5 (five) minutes x 3 doses as needed for chest pain. 25 tablet 0  . prasugrel (EFFIENT) 10 MG TABS tablet Take 1 tablet (10 mg total) by mouth daily. 90 tablet 3   No facility-administered medications prior to visit.     No Known Allergies  Social History   Tobacco Use  . Smoking status: Former Smoker    Packs/day: 0.25    Years: 17.00    Pack years: 4.25    Types: Cigarettes, Cigars    Quit date: 03/23/2015    Years since quitting: 5.5  . Smokeless tobacco: Never Used  Vaping Use  . Vaping Use: Never used  Substance Use Topics  . Alcohol use: No    Alcohol/week: 0.0 standard drinks  . Drug use: Yes    Frequency: 7.0 times per week    Types: Marijuana    Family History  Problem Relation Age of Onset  . Diabetes Mother   . Eczema Mother   . Asthma Mother   . COPD Mother   . Hypertension Mother   . Hyperlipidemia Mother   . Stroke Mother   . Heart disease Mother   . Sleep apnea Mother   . Liver cancer Father   . Asthma Brother     Social History   Substance and Sexual Activity  Sexual Activity Not Currently  . Partners: Male   Comment: given condoms 09/19/20      Objective:   Vitals:   09/19/20 1607  BP: 101/66  Pulse: 88  Temp: 99.1 F (37.3 C)  TempSrc: Oral  SpO2: 95%  Weight: 126 lb (57.2 kg)  Height: 5\' 4"  (1.626 m)   Body mass index is 21.63 kg/m.  Physical Exam Constitutional:      Comments: Thin appearing, no distress. Pleasant   Cardiovascular:     Rate and Rhythm: Normal rate and regular rhythm.     Pulses:  Normal pulses.     Heart sounds: No murmur heard.   Pulmonary:     Effort: Pulmonary effort is normal.     Breath sounds: Wheezing present. No rhonchi.  Chest:     Chest wall: Tenderness (right anterior/lateral lower ribs diffusely painful. ) present.  Abdominal:     General: There is no distension.     Palpations: Abdomen is soft.     Tenderness: There is no abdominal tenderness.  Skin:    General: Skin is warm and dry.     Capillary Refill: Capillary refill takes less than 2 seconds.  Neurological:     Mental Status: He is alert and oriented to person, place, and time.  Lab Results Lab Results  Component Value Date   WBC 10.2 08/31/2020   HGB 17.7 (H) 08/31/2020   HCT 47.5 08/31/2020   MCV 90.3 08/31/2020   PLT 246 08/31/2020    Lab Results  Component Value Date   CREATININE 1.94 (H) 08/31/2020   BUN 25 (H) 08/31/2020   NA 133 (L) 08/31/2020   K 4.3 08/31/2020   CL 95 (L) 08/31/2020   CO2 27 08/31/2020    Lab Results  Component Value Date   ALT 14 10/02/2019   AST 20 10/02/2019   ALKPHOS 73 09/19/2019   BILITOT 0.5 10/02/2019    Lab Results  Component Value Date   CHOL 169 01/03/2020   HDL 29 (L) 01/03/2020   LDLCALC 117 (H) 01/03/2020   TRIG 121 01/03/2020   CHOLHDL 5.8 (H) 01/03/2020   HIV 1 RNA Quant (copies/mL)  Date Value  07/03/2020 78 (H)  01/03/2020 116 (H)  10/02/2019 83 (H)   CD4 T Cell Abs (/uL)  Date Value  07/03/2020 762  01/03/2020 545  10/02/2019 863     Assessment & Plan:   Problem List Items Addressed This Visit      Unprioritized   HIV (human immunodeficiency virus infection) (HCC)    Virologically suppressed with low level RNA < 100 recently but adequate CD4 count.  FU with Dr. Drue Second as currently scheduled.       Hemorrhoid    Improved to the point it is nearly resolved. Will give rx for anusol to have on hand at home should he need these again. Would like for him to stay out of ER for non-emergent visits as much  as possible to reduce risk for COVID exposure.       Chest pain    Old rib fracture noted on CXR otherwise emphysematous changes noted, no organized pneumonia. He describes his condition to be improving now. On exam he is diffusely wheezy on exam - will refill albuterol inhaler. I think he should also be seen back with pulmonology for consideration of repeat PFTs and other maintenance nebs/inhalers given his history. Will provide with sputum cup to submit a sample if he still has productive cough. Check AFB / routine culture.   With rib fracture will check vitamin D for him and recommend supplementation as indicated. Will have him come back to see Dr. Drue Second in 4 weeks for follow up - I asked him to please call if he notes any resurgence of fevers or chills or worsening cough if supportive care does not continue to improve his condition.         Allergic rhinitis    Resume once daily antihistamine. Unable to do flonase with Prezcobix drug interaction. He probably will need some maintenance inhalers that are also challenging with Prezcobix. I only see K103 on genotypes; given persistent low level viral load regimen seems to have been intensified with addition of PI.        Other Visit Diagnoses    Pathological fracture of rib with routine healing, subsequent encounter    -  Primary   Relevant Orders   Vitamin D (25 hydroxy) (Completed)   Pulmonary emphysema, unspecified emphysema type (HCC)       Relevant Medications   albuterol (VENTOLIN HFA) 108 (90 Base) MCG/ACT inhaler   Other Relevant Orders   Ambulatory referral to Pulmonology   Need for immunization against influenza       Relevant Orders   Flu Vaccine QUAD 36+ mos IM (  Completed)      Rexene Alberts, MSN, NP-C [facility] Pager: [phone] Office: [phone]  09/30/20  3:47 PM

## 2020-09-19 NOTE — Patient Instructions (Addendum)
Continue taking the Biktarvy and Prezcobix every day with food like you are.   For the chest pain - please start taking over the counter ibuprofen two tablets every 6-8 hours for the next 5 days.   Please start taking an allergy pill (zyrtec, claritin or allegra) once a day to see if that helps.    Stop by the lab on your way out please and start taking Vitamin D 2000 units once a day from the pharmacy. I suspect you need some extra support of your bones.    Please come back to bring  A sputum sample when you can - bring same day first thing in the morning if you can   Please come back to see Dr. Drue Second in 1 month to follow up on today's visit.

## 2020-09-19 NOTE — ED Provider Notes (Signed)
[facility]    CSN: [id number] Arrival date & time: 09/18/20  [id number]      History   Chief Complaint Chief Complaint  Patient presents with  . Rt rib pain    HPI Rodney Walker is a 39 y.o. male.   Patient is a 39 year old male past medical history of anemia, COPD, CAD, eczema, MI, HIV, ischemic cardiomyopathy, hyperlipidemia, pneumonia, lobectomy. He presents today with continued right rib pain. Was seen here on 08/31/2020 where they found a remote right rib fracture. Believe this may have been caused by coughing. Denies any injury prior to this. He still has some lingering pain in this area more with movement, breathing and coughing. Reporting now he is having some left sided throat discomfort with left ear pain. Denies any fevers. Does have history of allergies and takes allergy medicine daily. He would also like to have his hemorrhoids checked. He feels they are improving. He has been using the hydrocortisone cream and the rectal suppositories. Denies any pain in the area or pain with bowel movement. Denies any bleeding.     Past Medical History:  Diagnosis Date  . Anemia   . COPD (chronic obstructive pulmonary disease) (HCC)   . Coronary artery disease    MI 2010 - dissection/plaque rupture in LAD tx with antiplt/antithrombotics >> relook cath with resolution (no PCI performed) // Inf STEMI 3/19: OM1 30; pRCA 100 >> DES // Echo 3/19:  mild LVH, EF 40-45, inf/inf-sept HK, trivial MR  . Eczema   . History of myocardial infarction 2010; 2019  . History of pleural empyema   . HIV infection (HCC)   . Hyperlipidemia   . Immune deficiency disorder (HCC)   . Ischemic cardiomyopathy 04/05/2018  . Moderate persistent asthma without complication 10/29/2016  . Myocardial infarction (HCC)    2009, 2019  . Pneumonia     Patient Active Problem List   Diagnosis Date Noted  . Avascular necrosis of bone of right hip (HCC) 09/25/2019  . Status post total replacement of  right hip 09/25/2019  . Hyperlipidemia 04/05/2018  . Ischemic cardiomyopathy 04/05/2018  . History of acute inferior wall MI 03/24/2018  . Marijuana abuse, continuous 10/29/2016  . Moderate persistent asthma without complication 10/29/2016  . Allergic rhinitis 10/08/2016  . Anemia 05/03/2015  . Lung abscess (HCC) 04/22/2015  . Sepsis due to pneumonia (HCC) 04/17/2015  . Bullous emphysema (HCC)   . Lobar pneumonia (HCC)   . Empyema lung (HCC) 04/16/2015  . HIV (human immunodeficiency virus infection) (HCC)   . Tobacco abuse 03/21/2015  . Lung mass 08/31/2014  . 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 Myalgia 04/23/2014  . AIDS (HCC) 03/22/2014  . CAP (community acquired pneumonia) 03/22/2014  . CAD (coronary artery disease) 11/13/2011    Past Surgical History:  Procedure Laterality Date  . CARDIAC CATHETERIZATION  06/24/2009   EF 60%  . CARDIAC CATHETERIZATION  06/17/2009   EF 45%. ANTERIOR HYPOKINESIS  . CORONARY STENT INTERVENTION N/A 03/24/2018   Procedure: CORONARY STENT INTERVENTION;  Surgeon: Tonny Bollman, MD;  Location[facility] INVASIVE CV LAB;  Service: Cardiovascular;  Laterality: N/A;  . CORONARY/GRAFT ACUTE MI REVASCULARIZATION N/A 03/24/2018   Procedure: Coronary/Graft Acute MI Revascularization;  Surgeon: Tonny Bollman, MD;  Location: [facility] INVASIVE CV LAB;  Service: Cardiovascular;  Laterality: N/A;  . LEFT HEART CATH AND CORONARY ANGIOGRAPHY N/A 03/24/2018   Procedure: LEFT HEART CATH AND CORONARY ANGIOGRAPHY;  Surgeon: Tonny Bollman, MD;  Location: [facility] INVASIVE CV LAB;  Service:  Cardiovascular;  Laterality: N/A;  . TOTAL HIP ARTHROPLASTY Right 09/25/2019   Procedure: RIGHT TOTAL HIP ARTHROPLASTY ANTERIOR APPROACH;  Surgeon: Tarry Kos, MD;  Location: [facility] OR;  Service: Orthopedics;  Laterality: Right;  . US ECHOCARDIOGRAPHY  12/09/2009   EF 55-60%  . VIDEO ASSISTED THORACOSCOPY (VATS)/ LOBECTOMY Right 04/23/2015   Procedure: VIDEO ASSISTED THORACOSCOPY (VATS)/RIGHT  upper  and right middle LOBECTOMY;  Surgeon: Kerin Perna, MD;  Location: [facility] OR;  Service: Thoracic;  Laterality: Right;  Marland Kitchen VIDEO BRONCHOSCOPY Bilateral 09/05/2014   Procedure: VIDEO BRONCHOSCOPY WITH FLUORO;  Surgeon: Merwyn Katos, MD;  Location: [facility] ENDOSCOPY;  Service: Cardiopulmonary;  Laterality: Bilateral;  . VIDEO BRONCHOSCOPY Bilateral 04/10/2015   Procedure: VIDEO BRONCHOSCOPY WITH FLUORO;  Surgeon: Lupita Leash, MD;  Location: [facility] ENDOSCOPY;  Service: Cardiopulmonary;  Laterality: Bilateral;       Home Medications    Prior to Admission medications   Medication Sig Start Date End Date Taking? Authorizing Provider  aspirin EC 81 MG tablet Take 1 tablet (81 mg total) by mouth 2 (two) times daily. 09/25/19  Yes Tarry Kos, MD  bictegravir-emtricitabine-tenofovir AF (BIKTARVY) 50-200-25 MG TABS tablet Take 1 tablet by mouth daily. 05/01/20  Yes Judyann Munson, MD  cetirizine (ZYRTEC) 10 MG tablet TAKE 1 TABLET(10 MG) BY MOUTH DAILY 09/03/20  Yes Judyann Munson, MD  darunavir-cobicistat (PREZCOBIX) 800-150 MG tablet TAKE 1 TABLET BY MOUTH DAILY WITH BREAKFAST. SWALLOW WHOLE. DO NOT CRUSH, BREAK OR CHEW TABLETS.TAKE WITH FOOD 05/01/20  Yes Judyann Munson, MD  metoprolol succinate (TOPROL-XL) 25 MG 24 hr tablet TAKE 1 TABLET(25 MG) BY MOUTH DAILY. Please keep upcoming apt for future refills. 06/27/20  Yes [name], [name], MD  nitroGLYCERIN (NITROSTAT) 0.4 MG SL tablet Place 1 tablet (0.4 mg total) under the tongue every 5 (five) minutes x 3 doses as needed for chest pain. 06/27/20  Yes [name], [name], MD  prasugrel (EFFIENT) 10 MG TABS tablet Take 1 tablet (10 mg total) by mouth daily. 08/07/20  Yes [name], [name], MD  HYDROcodone-acetaminophen (NORCO/VICODIN) 5-325 MG tablet Take 1 tablet by mouth every 6 (six) hours as needed. 09/18/20   Janace Aris, NP    Family History Family History  Problem Relation Age of Onset  . Diabetes Mother   . Eczema Mother   . Asthma Mother   .  COPD Mother   . Hypertension Mother   . Hyperlipidemia Mother   . Stroke Mother   . Heart disease Mother   . Sleep apnea Mother   . Liver cancer Father   . Asthma Brother     Social History Social History   Tobacco Use  . Smoking status: Former Smoker    Packs/day: 0.25    Years: 17.00    Pack years: 4.25    Types: Cigarettes, Cigars    Quit date: 03/23/2015    Years since quitting: 5.4  . Smokeless tobacco: Never Used  Vaping Use  . Vaping Use: Never used  Substance Use Topics  . Alcohol use: No    Alcohol/week: 0.0 standard drinks  . Drug use: Yes    Frequency: 7.0 times per week    Types: Marijuana     Allergies   Patient has no known allergies.   Review of Systems Review of Systems   Physical Exam Triage Vital Signs ED Triage Vitals  Enc Vitals Group     BP 09/18/20 1119 119/84     Pulse Rate 09/18/20 1119 87  Resp 09/18/20 1119 19     Temp 09/18/20 1119 98 F (36.7 C)     Temp Source 09/18/20 1119 Oral     SpO2 09/18/20 1119 95 %     Weight 09/18/20 1121 128 lb (58.1 kg)     Height 09/18/20 1121 5\' 4"  (1.626 m)     Head Circumference --      Peak Flow --      Pain Score 09/18/20 1120 8     Pain Loc --      Pain Edu? --      Excl. in GC? --    No data found.  Updated Vital Signs BP 119/84 (BP Location: Right Arm)   Pulse 87   Temp 98 F (36.7 C) (Oral)   Resp 19   Ht 5\' 4"  (1.626 m)   Wt 128 lb (58.1 kg)   SpO2 95%   BMI 21.97 kg/m   Visual Acuity Right Eye Distance:   Left Eye Distance:   Bilateral Distance:    Right Eye Near:   Left Eye Near:    Bilateral Near:     Physical Exam Vitals and nursing note reviewed.  Constitutional:      Appearance: Normal appearance.  HENT:     Head: Normocephalic and atraumatic.     Right Ear: Tympanic membrane and ear canal normal.     Left Ear: Tympanic membrane and ear canal normal.     Nose: Nose normal.     Mouth/Throat:     Pharynx: Oropharynx is clear.  Eyes:      Conjunctiva/sclera: Conjunctivae normal.  Cardiovascular:     Rate and Rhythm: Normal rate and regular rhythm.  Pulmonary:     Effort: Pulmonary effort is normal.     Breath sounds: Normal breath sounds.     Comments: Decreased lung sounds in right lung base. Genitourinary:    Comments: Skin tag from hemorrhoid. No thrombosed area, erythema or swelling. No abscess. Nontender Musculoskeletal:        General: Normal range of motion.     Cervical back: Normal range of motion.  Skin:    General: Skin is warm and dry.  Neurological:     Mental Status: He is alert.  Psychiatric:        Mood and Affect: Mood normal.      UC Treatments / Results  Labs (all labs ordered are listed, but only abnormal results are displayed) Labs Reviewed - No data to display  EKG   Radiology DG Ribs Unilateral W/Chest Right  Result Date: 09/18/2020 CLINICAL DATA:  Fever and shortness of breath.  Pain. EXAM: RIGHT RIBS AND CHEST - 3+ VIEW COMPARISON:  Chest radiograph September 19, 2019 FINDINGS: Frontal chest as well as oblique and cone-down rib images obtained. There is underlying emphysematous change. There are areas of postoperative change on the right with scarring and fibrosis. There is no edema or airspace opacity. Heart size is normal. Pulmonary vascularity is distorted on the right, stable, and felt to be within normal limits on the left given underlying emphysematous change. No adenopathy. No pleural effusion or pneumothorax evident. There is an old healed fracture of the right posterior fifth rib with remodeling. No acute appearing fracture evident. IMPRESSION: No acute fracture. Old healed fracture with remodeling posterior fifth rib on the right. Areas of scarring and fibrosis on the right with volume loss. Postoperative change noted on the right. Degree of underlying emphysematous change. Stable cardiac silhouette. No adenopathy evident  by radiography. Electronically Signed   By: Bretta Bang  III M.D.   On: 09/18/2020 12:43    Procedures Procedures (including critical care time)  Medications Ordered in UC Medications - No data to display  Initial Impression / Assessment and Plan / UC Course  I have reviewed the triage vital signs and the nursing notes.  Pertinent labs & imaging results that were available during my care of the patient were reviewed by me and considered in my medical decision making (see chart for details).     Pathologic rib fracture that is healing. Patient is a having pain from this. We will give him a few hydrocodone for pain to use as needed. Recommended alternate heat and ice  Hemorrhoid Appears to be healing and is now skin tag. No concerns. He can discontinue the hydrocortisone.   Sore throat and ear pain Most likely eustachian tube dysfunction. Recommended Flonase and Zyrtec. Patient has follow-up with his primary care tomorrow.   Final Clinical Impressions(s) / UC Diagnoses   Final diagnoses:  Pathologic rib fracture, with routine healing, subsequent encounter  Eustachian tube dysfunction, left  Residual hemorrhoidal skin tags     Discharge Instructions     Healing rib fracture Hydrocodone for pain as needed Continue the allergy medicine The hemorrhoid appears to be healing See your doctor tomorrow You need to have your lab work rechecked.    ED Prescriptions    Medication Sig Dispense Auth. Provider   HYDROcodone-acetaminophen (NORCO/VICODIN) 5-325 MG tablet Take 1 tablet by mouth every 6 (six) hours as needed. 10 tablet Debe Anfinson A, NP     I have reviewed the PDMP during this encounter.   Janace Aris, NP 09/19/20 [phone]

## 2020-09-20 LAB — VITAMIN D 25 HYDROXY (VIT D DEFICIENCY, FRACTURES): Vit D, 25-Hydroxy: 15 ng/mL — ABNORMAL LOW (ref 30–100)

## 2020-09-20 MED ORDER — VITAMIN D (ERGOCALCIFEROL) 1.25 MG (50000 UNIT) PO CAPS: 50000.0000 [IU] | ORAL_CAPSULE | ORAL | 0 refills | Status: DC

## 2020-09-23 DIAGNOSIS — E559 Vitamin D deficiency, unspecified: Secondary | ICD-10-CM

## 2020-09-23 DIAGNOSIS — M8448XD Pathological fracture, other site, subsequent encounter for fracture with routine healing: Secondary | ICD-10-CM

## 2020-09-24 ENCOUNTER — Other Ambulatory Visit: Payer: Self-pay | Admitting: Cardiovascular Disease

## 2020-09-24 MED ORDER — VITAMIN D3 125 MCG (5000 UT) PO CAPS: 1.0000 | ORAL_CAPSULE | Freq: Every day | ORAL | 2 refills | Status: DC

## 2020-09-24 NOTE — Addendum Note (Signed)
Addended by: Andree Coss on: 09/24/2020 10:12 AM   Modules accepted: Orders

## 2020-09-27 ENCOUNTER — Other Ambulatory Visit: Payer: Self-pay | Admitting: Internal Medicine

## 2020-09-27 DIAGNOSIS — B20 Human immunodeficiency virus [HIV] disease: Secondary | ICD-10-CM

## 2020-09-30 ENCOUNTER — Other Ambulatory Visit: Payer: Medicare Other

## 2020-09-30 ENCOUNTER — Other Ambulatory Visit: Payer: Self-pay

## 2020-09-30 DIAGNOSIS — R079 Chest pain, unspecified: Secondary | ICD-10-CM | POA: Insufficient documentation

## 2020-09-30 DIAGNOSIS — K649 Unspecified hemorrhoids: Secondary | ICD-10-CM | POA: Insufficient documentation

## 2020-09-30 DIAGNOSIS — B20 Human immunodeficiency virus [HIV] disease: Secondary | ICD-10-CM

## 2020-09-30 DIAGNOSIS — J439 Emphysema, unspecified: Secondary | ICD-10-CM

## 2020-09-30 MED ORDER — PREZCOBIX 800-150 MG PO TABS: ORAL_TABLET | ORAL | 2 refills | Status: DC

## 2020-09-30 NOTE — Assessment & Plan Note (Signed)
Improved to the point it is nearly resolved. Will give rx for anusol to have on hand at home should he need these again. Would like for him to stay out of ER for non-emergent visits as much as possible to reduce risk for COVID exposure.

## 2020-09-30 NOTE — Assessment & Plan Note (Signed)
Virologically suppressed with low level RNA < 100 recently but adequate CD4 count.  FU with Dr. Drue Second as currently scheduled.

## 2020-09-30 NOTE — Assessment & Plan Note (Addendum)
Old rib fracture noted on CXR otherwise emphysematous changes noted, no organized pneumonia. He describes his condition to be improving now. On exam he is diffusely wheezy on exam - will refill albuterol inhaler. I think he should also be seen back with pulmonology for consideration of repeat PFTs and other maintenance nebs/inhalers given his history. Will provide with sputum cup to submit a sample if he still has productive cough. Check AFB / routine culture.   With rib fracture will check vitamin D for him and recommend supplementation as indicated. Will have him come back to see Dr. Drue Second in 4 weeks for follow up - I asked him to please call if he notes any resurgence of fevers or chills or worsening cough if supportive care does not continue to improve his condition.

## 2020-09-30 NOTE — Assessment & Plan Note (Signed)
Resume once daily antihistamine. Unable to do flonase with Prezcobix drug interaction. He probably will need some maintenance inhalers that are also challenging with Prezcobix. I only see K103 on genotypes; given persistent low level viral load regimen seems to have been intensified with addition of PI.

## 2020-10-02 ENCOUNTER — Ambulatory Visit (INDEPENDENT_AMBULATORY_CARE_PROVIDER_SITE_OTHER): Payer: Medicare Other | Admitting: Emergency Medicine

## 2020-10-02 ENCOUNTER — Encounter: Payer: Self-pay | Admitting: Emergency Medicine

## 2020-10-02 ENCOUNTER — Other Ambulatory Visit: Payer: Self-pay

## 2020-10-02 VITALS — BP 100/69 | HR 72 | Temp 97.9°F | Ht 64.0 in | Wt 126.0 lb

## 2020-10-02 DIAGNOSIS — I252 Old myocardial infarction: Secondary | ICD-10-CM

## 2020-10-02 DIAGNOSIS — I2510 Atherosclerotic heart disease of native coronary artery without angina pectoris: Secondary | ICD-10-CM | POA: Diagnosis not present

## 2020-10-02 DIAGNOSIS — J439 Emphysema, unspecified: Secondary | ICD-10-CM

## 2020-10-02 DIAGNOSIS — Z7689 Persons encountering health services in other specified circumstances: Secondary | ICD-10-CM

## 2020-10-02 DIAGNOSIS — Z96641 Presence of right artificial hip joint: Secondary | ICD-10-CM

## 2020-10-02 DIAGNOSIS — Z21 Asymptomatic human immunodeficiency virus [HIV] infection status: Secondary | ICD-10-CM

## 2020-10-02 DIAGNOSIS — M87051 Idiopathic aseptic necrosis of right femur: Secondary | ICD-10-CM | POA: Diagnosis not present

## 2020-10-02 DIAGNOSIS — K649 Unspecified hemorrhoids: Secondary | ICD-10-CM | POA: Diagnosis not present

## 2020-10-02 DIAGNOSIS — I25118 Atherosclerotic heart disease of native coronary artery with other forms of angina pectoris: Secondary | ICD-10-CM

## 2020-10-02 DIAGNOSIS — I255 Ischemic cardiomyopathy: Secondary | ICD-10-CM

## 2020-10-02 DIAGNOSIS — E785 Hyperlipidemia, unspecified: Secondary | ICD-10-CM

## 2020-10-02 MED ORDER — HYDROCORTISONE (PERIANAL) 2.5 % EX CREA: 1.0000 "application " | TOPICAL_CREAM | Freq: Two times a day (BID) | CUTANEOUS | 3 refills | Status: DC

## 2020-10-02 MED ORDER — HYDROCORTISONE ACETATE 25 MG RE SUPP: 25.0000 mg | Freq: Two times a day (BID) | RECTAL | 1 refills | Status: DC

## 2020-10-02 MED ORDER — ROSUVASTATIN CALCIUM 10 MG PO TABS: 10.0000 mg | ORAL_TABLET | Freq: Every day | ORAL | 3 refills | Status: DC

## 2020-10-02 NOTE — Progress Notes (Signed)
Rodney Walker 39 y.o.   Chief Complaint  Patient presents with  . New Patient (Initial Visit)  . Cough    has pending sputum results from inf. dis. doctor snider     HISTORY OF PRESENT ILLNESS: This is a 39 y.o. male first visit to this office, here to establish care. Patient has multiple chronic medical problems: 1.  Positive HIV since 2015, on medications.  Sees Dr. Ilsa Iha, infectious disease specialist, on a regular basis. 2.  History of COPD/emphysema.  Ex smoker.  Also has history of right-sided lung surgery.  Has occasional dry cough. 3.  History of coronary artery disease status post 2 MIs, last one 3 years ago.  Had one stent placed in.  Presently on metoprolol succinate 25 mg daily on daily baby aspirin.  No need for nitroglycerin.  Also takes prasugrel 10 mg daily.  However he does not see cardiologist on a regular basis. 4.  History of right hip surgery presumably secondary to avascular necrosis. 5.  History of hemorrhoids. No other complaints or medical concerns today.  HPI   Prior to Admission medications   Medication Sig Start Date End Date Taking? Authorizing Provider  albuterol (VENTOLIN HFA) 108 (90 Base) MCG/ACT inhaler Inhale 2 puffs into the lungs every 6 (six) hours as needed for wheezing or shortness of breath. 09/19/20   Blanchard Kelch, NP  aspirin EC 81 MG tablet Take 1 tablet (81 mg total) by mouth 2 (two) times daily. 09/25/19   Tarry Kos, MD  BIKTARVY [phone] MG TABS tablet TAKE 1 TABLET BY MOUTH DAILY 09/30/20   Judyann Munson, MD  cetirizine (ZYRTEC) 10 MG tablet TAKE 1 TABLET(10 MG) BY MOUTH DAILY 09/03/20   Judyann Munson, MD  Cholecalciferol (VITAMIN D3) 125 MCG (5000 UT) CAPS Take 1 capsule (5,000 Units total) by mouth daily. 09/24/20   Blanchard Kelch, NP  darunavir-cobicistat (PREZCOBIX) 800-150 MG tablet TAKE 1 TABLET BY MOUTH DAILY WITH BREAKFAST. SWALLOW WHOLE. DO NOT CRUSH, BREAK OR CHEW TABLETS.TAKE WITH FOOD 09/30/20    Judyann Munson, MD  HYDROcodone-acetaminophen (NORCO/VICODIN) 5-325 MG tablet Take 1 tablet by mouth every 6 (six) hours as needed. 09/18/20   Dahlia Byes A, NP  hydrocortisone (ANUSOL-HC) 25 MG suppository Place 1 suppository (25 mg total) rectally 2 (two) times daily. 09/19/20   Blanchard Kelch, NP  metoprolol succinate (TOPROL-XL) 25 MG 24 hr tablet TAKE 1 TABLET(25 MG) BY MOUTH DAILY. Please keep upcoming apt for future refills. 06/27/20   [name], [name], MD  nitroGLYCERIN (NITROSTAT) 0.4 MG SL tablet Place 1 tablet (0.4 mg total) under the tongue every 5 (five) minutes x 3 doses as needed for chest pain. 06/27/20   [name], [name], MD  prasugrel (EFFIENT) 10 MG TABS tablet Take 1 tablet (10 mg total) by mouth daily. 08/07/20   [name], [name], MD    No Known Allergies  Patient Active Problem List   Diagnosis Date Noted  . Chest pain 09/30/2020  . Hemorrhoid 09/30/2020  . Avascular necrosis of bone of right hip (HCC) 09/25/2019  . 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 Hyperlipidemia 04/05/2018  . Ischemic cardiomyopathy 04/05/2018  . History of acute inferior wall MI 03/24/2018  . Marijuana abuse, continuous 10/29/2016  . Moderate persistent asthma without complication 10/29/2016  . Allergic rhinitis 10/08/2016  . Anemia 05/03/2015  . Lung abscess (HCC) 04/22/2015  . Bullous emphysema (HCC)   . Lobar pneumonia (HCC)   . Empyema  lung (HCC) 04/16/2015  . HIV (human immunodeficiency virus infection) (HCC)   . Tobacco abuse 03/21/2015  . Lung mass 08/31/2014  . 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 Myalgia 04/23/2014  . AIDS (HCC) 03/22/2014  . CAP (community acquired pneumonia) 03/22/2014  . CAD (coronary artery disease) 11/13/2011    Past Medical History:  Diagnosis Date  . Anemia   . COPD (chronic obstructive pulmonary disease) (HCC)   . Coronary artery disease    MI 2010 - dissection/plaque rupture in LAD tx with antiplt/antithrombotics >> relook  cath with resolution (no PCI performed) // Inf STEMI 3/19: OM1 30; pRCA 100 >> DES // Echo 3/19:  mild LVH, EF 40-45, inf/inf-sept HK, trivial MR  . Eczema   . History of myocardial infarction 2010; 2019  . History of pleural empyema   . HIV infection (HCC)   . Hyperlipidemia   . Immune deficiency disorder (HCC)   . Ischemic cardiomyopathy 04/05/2018  . Moderate persistent asthma without complication 10/29/2016  . Myocardial infarction (HCC)    2009, 2019  . Pneumonia     Past Surgical History:  Procedure Laterality Date  . CARDIAC CATHETERIZATION  06/24/2009   EF 60%  . CARDIAC CATHETERIZATION  06/17/2009   EF 45%. ANTERIOR HYPOKINESIS  . CORONARY STENT INTERVENTION N/A 03/24/2018   Procedure: CORONARY STENT INTERVENTION;  Surgeon: Tonny Bollman, MD;  Location: [facility] INVASIVE CV LAB;  Service: Cardiovascular;  Laterality: N/A;  . CORONARY/GRAFT ACUTE MI REVASCULARIZATION N/A 03/24/2018   Procedure: Coronary/Graft Acute MI Revascularization;  Surgeon: Tonny Bollman, MD;  Location: [facility] INVASIVE CV LAB;  Service: Cardiovascular;  Laterality: N/A;  . JOINT REPLACEMENT N/A    [employer]rees[employer] 10/01/2020  . LEFT HEART CATH AND CORONARY ANGIOGRAPHY N/A 03/24/2018   Procedure: LEFT HEART CATH AND CORONARY ANGIOGRAPHY;  Surgeon: Tonny Bollman, MD;  Location: [facility] INVASIVE CV LAB;  Service: Cardiovascular;  Laterality: N/A;  . TOTAL HIP ARTHROPLASTY Right 09/25/2019   Procedure: RIGHT TOTAL HIP ARTHROPLASTY ANTERIOR APPROACH;  Surgeon: Tarry Kos, MD;  Location: [facility] OR;  Service: Orthopedics;  Laterality: Right;  . US ECHOCARDIOGRAPHY  12/09/2009   EF 55-60%  . VIDEO ASSISTED THORACOSCOPY (VATS)/ LOBECTOMY Right 04/23/2015   Procedure: VIDEO ASSISTED THORACOSCOPY (VATS)/RIGHT upper  and right middle LOBECTOMY;  Surgeon: Kerin Perna, MD;  Location: [facility] OR;  Service: Thoracic;  Laterality: Right;  Marland Kitchen VIDEO BRONCHOSCOPY Bilateral 09/05/2014   Procedure: VIDEO BRONCHOSCOPY WITH FLUORO;  Surgeon: Merwyn Katos, MD;  Location: [facility] ENDOSCOPY;  Service: Cardiopulmonary;  Laterality: Bilateral;  . VIDEO BRONCHOSCOPY Bilateral 04/10/2015   Procedure: VIDEO BRONCHOSCOPY WITH FLUORO;  Surgeon: Lupita Leash, MD;  Location: [facility] ENDOSCOPY;  Service: Cardiopulmonary;  Laterality: Bilateral;    Social History   Socioeconomic History  . Marital status: Single    Spouse name: Not on file  . Number of children: Not on file  . Years of education: Not on file  . Highest education level: Not on file  Occupational History  . Occupation: unemployed  Tobacco Use  . Smoking status: Former Smoker    Packs/day: 0.25    Years: 17.00    Pack years: 4.25    Types: Cigarettes, Cigars    Quit date: 03/23/2015    Years since quitting: 5.5  . Smokeless tobacco: Never Used  Vaping Use  . Vaping Use: Never used  Substance and Sexual Activity  . Alcohol use: No    Alcohol/week: 0.0 standard drinks  . Drug use: Yes  Frequency: 7.0 times per week    Types: Marijuana  . Sexual activity: Not Currently    Partners: Male    Comment: given condoms 09/19/20   Other Topics Concern  . Not on file  Social History Narrative  . Not on file   Social Determinants of Health   Financial Resource Strain:   . Difficulty of Paying Living Expenses: Not on file  Food Insecurity:   . Worried About Programme researcher, broadcasting/film/video in the Last Year: Not on file  . Ran Out of Food in the Last Year: Not on file  Transportation Needs:   . Lack of Transportation (Medical): Not on file  . Lack of Transportation (Non-Medical): Not on file  Physical Activity:   . Days of Exercise per Week: Not on file  . Minutes of Exercise per Session: Not on file  Stress:   . Feeling of Stress : Not on file  Social Connections:   . Frequency of Communication with Friends and Family: Not on file  . Frequency of Social Gatherings with Friends and Family: Not on file  . Attends Religious Services: Not on file  . Active Member of Clubs or  Organizations: Not on file  . Attends Banker Meetings: Not on file  . Marital Status: Not on file  Intimate Partner Violence:   . Fear of Current or Ex-Partner: Not on file  . Emotionally Abused: Not on file  . Physically Abused: Not on file  . Sexually Abused: Not on file    Family History  Problem Relation Age of Onset  . Diabetes Mother   . Eczema Mother   . Asthma Mother   . COPD Mother   . Hypertension Mother   . Hyperlipidemia Mother   . Stroke Mother   . Heart disease Mother   . Sleep apnea Mother   . Liver cancer Father   . Asthma Brother      Review of Systems  Constitutional: Negative.  Negative for chills and fever.  HENT: Negative.  Negative for congestion and sore throat.   Respiratory: Positive for cough. Negative for sputum production and shortness of breath.   Cardiovascular: Negative.  Negative for chest pain and palpitations.  Gastrointestinal: Negative.  Negative for abdominal pain, diarrhea, nausea and vomiting.  Genitourinary: Negative.  Negative for dysuria and hematuria.  Musculoskeletal: Negative.  Negative for myalgias.  Skin: Negative.  Negative for rash.  Neurological: Negative.  Negative for dizziness and headaches.  All other systems reviewed and are negative.    Today's Vitals   10/02/20 1029  BP: 100/69  Pulse: 72  Temp: 97.9 F (36.6 C)  SpO2: 97%  Weight: 126 lb (57.2 kg)  Height: 5\' 4"  (1.626 m)   Body mass index is 21.63 kg/m.   Physical Exam Vitals reviewed.  Constitutional:      Appearance: Normal appearance.  HENT:     Head: Normocephalic.  Eyes:     Extraocular Movements: Extraocular movements intact.     Pupils: Pupils are equal, round, and reactive to light.  Neck:     Vascular: No carotid bruit.  Cardiovascular:     Rate and Rhythm: Normal rate and regular rhythm.     Pulses: Normal pulses.     Heart sounds: Normal heart sounds.  Pulmonary:     Effort: Pulmonary effort is normal.     Breath  sounds: Normal breath sounds.  Abdominal:     Palpations: Abdomen is soft.  Tenderness: There is no abdominal tenderness.  Musculoskeletal:        General: Normal range of motion.     Cervical back: Normal range of motion and neck supple. No tenderness.  Lymphadenopathy:     Cervical: No cervical adenopathy.  Skin:    General: Skin is warm and dry.     Capillary Refill: Capillary refill takes less than 2 seconds.  Neurological:     General: No focal deficit present.     Mental Status: He is alert and oriented to person, place, and time.  Psychiatric:        Mood and Affect: Mood normal.        Behavior: Behavior normal.    A total of 45 minutes was spent with the patient, greater than 50% of which was in counseling/coordination of care regarding establishing care, review of past medical history and multiple chronic medical problems, presence of atherosclerotic cardiovascular disease and need for statin therapy with Crestor 10 mg daily, cardiovascular risks associated with his chronic conditions, review of all medications, review of office visit notes from different specialists, hemorrhoids and management including medications, diet and nutrition, prognosis, documentation, and need for follow-up.   ASSESSMENT & PLAN: Deron was seen today for new patient (initial visit) and cough.  Diagnoses and all orders for this visit:  Encounter to establish care  Hemorrhoids, unspecified hemorrhoid type -     hydrocortisone (ANUSOL-HC) 25 MG suppository; Place 1 suppository (25 mg total) rectally 2 (two) times daily. -     hydrocortisone (ANUSOL-HC) 2.5 % rectal cream; Place 1 application rectally 2 (two) times daily.  Avascular necrosis of bone of right hip (HCC)  Atherosclerotic cardiovascular disease -     rosuvastatin (CRESTOR) 10 MG tablet; Take 1 tablet (10 mg total) by mouth daily.  Status post total replacement of right hip  Hyperlipidemia, unspecified hyperlipidemia  type  Ischemic cardiomyopathy  History of acute inferior wall MI  Bullous emphysema (HCC)  Asymptomatic HIV infection, with no history of HIV-related illness (HCC)  Coronary artery disease of native artery of native heart with stable angina pectoris [facility])    Patient Instructions       If you have lab work done today you will be contacted with your lab results within the next 2 weeks.  If you have not heard from [place] then please contact us. The fastest way to get your results is to register for My Chart.   IF you received an x-ray today, you will receive an invoice from [facility]. Please contact [facility] at [phone] with questions or concerns regarding your invoice.   IF you received labwork today, you will receive an invoice from [facility]. Please contact [facility] at [phone] with questions or concerns regarding your invoice.   Our billing staff will not be able to assist you with questions regarding bills from these companies.  You will be contacted with the lab results as soon as they are available. The fastest way to get your results is to activate your My Chart account. Instructions are located on the last page of this paperwork. If you have not heard from [place] regarding the results in 2 weeks, please contact this office.        Health Maintenance, Male Adopting a healthy lifestyle and getting preventive care are important in promoting health and wellness. Ask your health care provider about:  The right schedule for you to have regular tests and exams.  Things you can do on your  own to prevent diseases and keep yourself healthy. What should I know about diet, weight, and exercise? Eat a healthy diet   Eat a diet that includes plenty of vegetables, fruits, low-fat dairy products, and lean protein.  Do not eat a lot of foods that are high in solid fats, added sugars, or sodium. Maintain a healthy weight Body mass index (BMI) is a  measurement that can be used to identify possible weight problems. It estimates body fat based on height and weight. Your health care provider can help determine your BMI and help you achieve or maintain a healthy weight. Get regular exercise Get regular exercise. This is one of the most important things you can do for your health. Most adults should:  Exercise for at least 150 minutes each week. The exercise should increase your heart rate and make you sweat (moderate-intensity exercise).  Do strengthening exercises at least twice a week. This is in addition to the moderate-intensity exercise.  Spend less time sitting. Even light physical activity can be beneficial. Watch cholesterol and blood lipids Have your blood tested for lipids and cholesterol at 40 years of age, then have this test every 5 years. You may need to have your cholesterol levels checked more often if:  Your lipid or cholesterol levels are high.  You are older than 40 years of age.  You are at high risk for heart disease. What should I know about cancer screening? Many types of cancers can be detected early and may often be prevented. Depending on your health history and family history, you may need to have cancer screening at various ages. This may include screening for:  Colorectal cancer.  Prostate cancer.  Skin cancer.  Lung cancer. What should I know about heart disease, diabetes, and high blood pressure? Blood pressure and heart disease  High blood pressure causes heart disease and increases the risk of stroke. This is more likely to develop in people who have high blood pressure readings, are of African descent, or are overweight.  Talk with your health care provider about your target blood pressure readings.  Have your blood pressure checked: ? Every 3-5 years if you are 70-73 years of age. ? Every year if you are 11 years old or older.  If you are between the ages of 108 and 60 and are a current or  former smoker, ask your health care provider if you should have a one-time screening for abdominal aortic aneurysm (AAA). Diabetes Have regular diabetes screenings. This checks your fasting blood sugar level. Have the screening done:  Once every three years after age 22 if you are at a normal weight and have a low risk for diabetes.  More often and at a younger age if you are overweight or have a high risk for diabetes. What should I know about preventing infection? Hepatitis B If you have a higher risk for hepatitis B, you should be screened for this virus. Talk with your health care provider to find out if you are at risk for hepatitis B infection. Hepatitis C Blood testing is recommended for:  Everyone born from [birth year] through 1966.  Anyone with known risk factors for hepatitis C. Sexually transmitted infections (STIs)  You should be screened each year for STIs, including gonorrhea and chlamydia, if: ? You are sexually active and are younger than 40 years of age. ? You are older than 40 years of age and your health care provider tells you that you are at risk for  this type of infection. ? Your sexual activity has changed since you were last screened, and you are at increased risk for chlamydia or gonorrhea. Ask your health care provider if you are at risk.  Ask your health care provider about whether you are at high risk for HIV. Your health care provider may recommend a prescription medicine to help prevent HIV infection. If you choose to take medicine to prevent HIV, you should first get tested for HIV. You should then be tested every 3 months for as long as you are taking the medicine. Follow these instructions at home: Lifestyle  Do not use any products that contain nicotine or tobacco, such as cigarettes, e-cigarettes, and chewing tobacco. If you need help quitting, ask your health care provider.  Do not use street drugs.  Do not share needles.  Ask your health care provider  for help if you need support or information about quitting drugs. Alcohol use  Do not drink alcohol if your health care provider tells you not to drink.  If you drink alcohol: ? Limit how much you have to 0-2 drinks a day. ? Be aware of how much alcohol is in your drink. In the [place] one drink equals one 12 oz bottle of beer (355 mL), one 5 oz glass of wine (148 mL), or one 1 oz glass of hard liquor (44 mL). General instructions  Schedule regular health, dental, and eye exams.  Stay current with your vaccines.  Tell your health care provider if: ? You often feel depressed. ? You have ever been abused or do not feel safe at home. Summary  Adopting a healthy lifestyle and getting preventive care are important in promoting health and wellness.  Follow your health care provider's instructions about healthy diet, exercising, and getting tested or screened for diseases.  Follow your health care provider's instructions on monitoring your cholesterol and blood pressure. This information is not intended to replace advice given to you by your health care provider. Make sure you discuss any questions you have with your health care provider. Document Revised: 12/07/2018 Document Reviewed: 12/07/2018 Elsevier Patient Education  2020 [employer]      Edwina Barth, MD [facility]

## 2020-10-02 NOTE — Patient Instructions (Addendum)
   If you have lab work done today you will be contacted with your lab results within the next 2 weeks.  If you have not heard from us then please contact us. The fastest way to get your results is to register for My Chart.   IF you received an x-ray today, you will receive an invoice from [facility]. Please contact [facility] at [phone] with questions or concerns regarding your invoice.   IF you received labwork today, you will receive an invoice from [facility]. Please contact [facility] at [phone] with questions or concerns regarding your invoice.   Our billing staff will not be able to assist you with questions regarding bills from these companies.  You will be contacted with the lab results as soon as they are available. The fastest way to get your results is to activate your My Chart account. Instructions are located on the last page of this paperwork. If you have not heard from us regarding the results in 2 weeks, please contact this office.      Health Maintenance, Male Adopting a healthy lifestyle and getting preventive care are important in promoting health and wellness. Ask your health care provider about:  The right schedule for you to have regular tests and exams.  Things you can do on your own to prevent diseases and keep yourself healthy. What should I know about diet, weight, and exercise? Eat a healthy diet   Eat a diet that includes plenty of vegetables, fruits, low-fat dairy products, and lean protein.  Do not eat a lot of foods that are high in solid fats, added sugars, or sodium. Maintain a healthy weight Body mass index (BMI) is a measurement that can be used to identify possible weight problems. It estimates body fat based on height and weight. Your health care provider can help determine your BMI and help you achieve or maintain a healthy weight. Get regular exercise Get regular exercise. This is one of the most important things you  can do for your health. Most adults should:  Exercise for at least 150 minutes each week. The exercise should increase your heart rate and make you sweat (moderate-intensity exercise).  Do strengthening exercises at least twice a week. This is in addition to the moderate-intensity exercise.  Spend less time sitting. Even light physical activity can be beneficial. Watch cholesterol and blood lipids Have your blood tested for lipids and cholesterol at 40 years of age, then have this test every 5 years. You may need to have your cholesterol levels checked more often if:  Your lipid or cholesterol levels are high.  You are older than 40 years of age.  You are at high risk for heart disease. What should I know about cancer screening? Many types of cancers can be detected early and may often be prevented. Depending on your health history and family history, you may need to have cancer screening at various ages. This may include screening for:  Colorectal cancer.  Prostate cancer.  Skin cancer.  Lung cancer. What should I know about heart disease, diabetes, and high blood pressure? Blood pressure and heart disease  High blood pressure causes heart disease and increases the risk of stroke. This is more likely to develop in people who have high blood pressure readings, are of African descent, or are overweight.  Talk with your health care provider about your target blood pressure readings.  Have your blood pressure checked: ? Every 3-5 years if you are 18-39   years of age. ? Every year if you are 40 years old or older.  If you are between the ages of 65 and 75 and are a current or former smoker, ask your health care provider if you should have a one-time screening for abdominal aortic aneurysm (AAA). Diabetes Have regular diabetes screenings. This checks your fasting blood sugar level. Have the screening done:  Once every three years after age 45 if you are at a normal weight and have  a low risk for diabetes.  More often and at a younger age if you are overweight or have a high risk for diabetes. What should I know about preventing infection? Hepatitis B If you have a higher risk for hepatitis B, you should be screened for this virus. Talk with your health care provider to find out if you are at risk for hepatitis B infection. Hepatitis C Blood testing is recommended for:  Everyone born from 1945 through 1965.  Anyone with known risk factors for hepatitis C. Sexually transmitted infections (STIs)  You should be screened each year for STIs, including gonorrhea and chlamydia, if: ? You are sexually active and are younger than 40 years of age. ? You are older than 40 years of age and your health care provider tells you that you are at risk for this type of infection. ? Your sexual activity has changed since you were last screened, and you are at increased risk for chlamydia or gonorrhea. Ask your health care provider if you are at risk.  Ask your health care provider about whether you are at high risk for HIV. Your health care provider may recommend a prescription medicine to help prevent HIV infection. If you choose to take medicine to prevent HIV, you should first get tested for HIV. You should then be tested every 3 months for as long as you are taking the medicine. Follow these instructions at home: Lifestyle  Do not use any products that contain nicotine or tobacco, such as cigarettes, e-cigarettes, and chewing tobacco. If you need help quitting, ask your health care provider.  Do not use street drugs.  Do not share needles.  Ask your health care provider for help if you need support or information about quitting drugs. Alcohol use  Do not drink alcohol if your health care provider tells you not to drink.  If you drink alcohol: ? Limit how much you have to 0-2 drinks a day. ? Be aware of how much alcohol is in your drink. In the [place] one drink equals one 12  oz bottle of beer (355 mL), one 5 oz glass of wine (148 mL), or one 1 oz glass of hard liquor (44 mL). General instructions  Schedule regular health, dental, and eye exams.  Stay current with your vaccines.  Tell your health care provider if: ? You often feel depressed. ? You have ever been abused or do not feel safe at home. Summary  Adopting a healthy lifestyle and getting preventive care are important in promoting health and wellness.  Follow your health care provider's instructions about healthy diet, exercising, and getting tested or screened for diseases.  Follow your health care provider's instructions on monitoring your cholesterol and blood pressure. This information is not intended to replace advice given to you by your health care provider. Make sure you discuss any questions you have with your health care provider. Document Revised: 12/07/2018 Document Reviewed: 12/07/2018 Elsevier Patient Education  2020 Elsevier Inc.  

## 2020-10-23 ENCOUNTER — Ambulatory Visit (INDEPENDENT_AMBULATORY_CARE_PROVIDER_SITE_OTHER): Payer: Medicare Other | Admitting: Internal Medicine

## 2020-10-23 ENCOUNTER — Other Ambulatory Visit: Payer: Self-pay

## 2020-10-23 ENCOUNTER — Encounter: Payer: Self-pay | Admitting: Internal Medicine

## 2020-10-23 VITALS — BP 96/65 | HR 85 | Temp 98.6°F | Ht 64.0 in | Wt 128.0 lb

## 2020-10-23 DIAGNOSIS — B20 Human immunodeficiency virus [HIV] disease: Secondary | ICD-10-CM

## 2020-10-23 DIAGNOSIS — K625 Hemorrhage of anus and rectum: Secondary | ICD-10-CM

## 2020-10-23 DIAGNOSIS — J431 Panlobular emphysema: Secondary | ICD-10-CM

## 2020-10-23 DIAGNOSIS — N179 Acute kidney failure, unspecified: Secondary | ICD-10-CM

## 2020-10-23 NOTE — Progress Notes (Signed)
RFV: follow up for hiv disease  Patient ID: Rodney Walker, male   DOB: 01/24/1981, 39 y.o.   MRN: [id number]  HPI Hemorrhoids now resolved with anusol. Occasional streaking when wiping Had productive couhg - afb smear negative since 09/30/20, but didn't do aerobic culture. Now improved. pcp did labs in early September that which found leukocytosis and aki.   Needs to re-establish care with pulmonary for emphysema management  (#4 lb down since July) -claims he lost weight with decrease appetite with hemorrhoids?  Had covid vaccine x 2 pfizer   Outpatient Encounter Medications as of 10/23/2020  Medication Sig  . albuterol (VENTOLIN HFA) 108 (90 Base) MCG/ACT inhaler Inhale 2 puffs into the lungs every 6 (six) hours as needed for wheezing or shortness of breath.  Marland Kitchen aspirin EC 81 MG tablet Take 1 tablet (81 mg total) by mouth 2 (two) times daily.  Marland Kitchen BIKTARVY 50-200-25 MG TABS tablet TAKE 1 TABLET BY MOUTH DAILY  . cetirizine (ZYRTEC) 10 MG tablet TAKE 1 TABLET(10 MG) BY MOUTH DAILY  . Cholecalciferol (VITAMIN D3) 125 MCG (5000 UT) CAPS Take 1 capsule (5,000 Units total) by mouth daily.  . darunavir-cobicistat (PREZCOBIX) 800-150 MG tablet TAKE 1 TABLET BY MOUTH DAILY WITH BREAKFAST. SWALLOW WHOLE. DO NOT CRUSH, BREAK OR CHEW TABLETS.TAKE WITH FOOD  . hydrocortisone (ANUSOL-HC) 2.5 % rectal cream Place 1 application rectally 2 (two) times daily.  . metoprolol succinate (TOPROL-XL) 25 MG 24 hr tablet TAKE 1 TABLET(25 MG) BY MOUTH DAILY. Please keep upcoming apt for future refills.  . prasugrel (EFFIENT) 10 MG TABS tablet Take 1 tablet (10 mg total) by mouth daily.  . rosuvastatin (CRESTOR) 10 MG tablet Take 1 tablet (10 mg total) by mouth daily.  Marland Kitchen HYDROcodone-acetaminophen (NORCO/VICODIN) 5-325 MG tablet Take 1 tablet by mouth every 6 (six) hours as needed. (Patient not taking: Reported on 10/23/2020)  . hydrocortisone (ANUSOL-HC) 25 MG suppository Place 1 suppository (25 mg  total) rectally 2 (two) times daily. (Patient not taking: Reported on 10/23/2020)  . nitroGLYCERIN (NITROSTAT) 0.4 MG SL tablet Place 1 tablet (0.4 mg total) under the tongue every 5 (five) minutes x 3 doses as needed for chest pain. (Patient not taking: Reported on 10/23/2020)   No facility-administered encounter medications on file as of 10/23/2020.     Patient Active Problem List   Diagnosis Date Noted  . Chest pain 09/30/2020  . Hemorrhoid 09/30/2020  . Avascular necrosis of bone of right hip (HCC) 09/25/2019  . 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 Hyperlipidemia 04/05/2018  . Ischemic cardiomyopathy 04/05/2018  . History of acute inferior wall MI 03/24/2018  . Marijuana abuse, continuous 10/29/2016  . Moderate persistent asthma without complication 10/29/2016  . Allergic rhinitis 10/08/2016  . Anemia 05/03/2015  . Lung abscess (HCC) 04/22/2015  . Bullous emphysema (HCC)   . Lobar pneumonia (HCC)   . Empyema lung (HCC) 04/16/2015  . HIV (human immunodeficiency virus infection) (HCC)   . Tobacco abuse 03/21/2015  . Lung mass 08/31/2014  . 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 Myalgia 04/23/2014  . AIDS (HCC) 03/22/2014  . CAP (community acquired pneumonia) 03/22/2014  . CAD (coronary artery disease) 11/13/2011     Health Maintenance Due  Topic Date Due  . COVID-19 Vaccine (2 - Pfizer 2-dose series) 04/24/2020    Social History   Tobacco Use  . Smoking status: Former Smoker    Packs/day: 0.25    Years: 17.00    Pack years: 4.25  Types: Cigarettes, Cigars    Quit date: 03/23/2015    Years since quitting: 5.6  . Smokeless tobacco: Never Used  Vaping Use  . Vaping Use: Never used  Substance Use Topics  . Alcohol use: No    Alcohol/week: 0.0 standard drinks  . Drug use: Yes    Frequency: 7.0 times per week    Types: Marijuana    Review of Systems 12 point ros is negative except what is mentioned above Physical Exam   BP 96/65    Pulse 85   Temp 98.6 F (37 C) (Oral)   Ht 5\' 4"  (1.626 m)   Wt 128 lb (58.1 kg)   SpO2 97%   BMI 21.97 kg/m   Physical Exam  Constitutional: He is oriented to person, place, and time. He appears well-developed and well-nourished. No distress.  HENT:  Mouth/Throat: Oropharynx is clear and moist. No oropharyngeal exudate.  Cardiovascular: Normal rate, regular rhythm and normal heart sounds. Exam reveals no gallop and no friction rub.  No murmur heard.  Pulmonary/Chest: Effort normal and breath sounds normal. No respiratory distress. He has no wheezes.  Abdominal: Soft. Bowel sounds are normal. He exhibits no distension. There is no tenderness.  Lymphadenopathy:  He has no cervical adenopathy.  Neurological: He is alert and oriented to person, place, and time.  Skin: Skin is warm and dry. No rash noted. No erythema.  Psychiatric: He has a normal mood and affect. His behavior is normal.    Rectal exam - no mass, good sphincter tone Lab Results  Component Value Date   CD4TCELL 25 (L) 07/03/2020   Lab Results  Component Value Date   CD4TABS 762 07/03/2020   CD4TABS 545 01/03/2020   CD4TABS 863 10/02/2019   Lab Results  Component Value Date   HIV1RNAQUANT 78 (H) 07/03/2020   No results found for: HEPBSAB Lab Results  Component Value Date   LABRPR REACTIVE (A) 07/03/2020    CBC Lab Results  Component Value Date   WBC 10.2 08/31/2020   RBC 5.26 08/31/2020   HGB 17.7 (H) 08/31/2020   HCT 47.5 08/31/2020   PLT 246 08/31/2020   MCV 90.3 08/31/2020   MCH 33.7 08/31/2020   MCHC 37.3 (H) 08/31/2020   RDW 12.8 08/31/2020   LYMPHSABS 2,336 01/03/2020   MONOABS 0.7 09/19/2019   EOSABS 50 01/03/2020    BMET Lab Results  Component Value Date   NA 133 (L) 08/31/2020   K 4.3 08/31/2020   CL 95 (L) 08/31/2020   CO2 27 08/31/2020   GLUCOSE 107 (H) 08/31/2020   BUN 25 (H) 08/31/2020   CREATININE 1.94 (H) 08/31/2020   CALCIUM 9.7 08/31/2020   GFRNONAA 42 (L) 08/31/2020     GFRAA 49 (L) 08/31/2020      Assessment and Plan  hiv disease = has low level viremia, maybe due to missing a few doses. Recommended ongoing 100% adherence if possible  Constipation =Try metamucil  aki = check labs  pulmonary amb ref- for management of copd/emphysema  Will set up for pfizer booster

## 2020-10-24 LAB — T-HELPER CELL (CD4) - (RCID CLINIC ONLY)
CD4 % Helper T Cell: 23 % — ABNORMAL LOW (ref 33–65)
CD4 T Cell Abs: 621 /uL (ref 400–1790)

## 2020-10-28 LAB — CBC WITH DIFFERENTIAL/PLATELET
Absolute Monocytes: 544 cells/uL (ref 200–950)
Basophils Absolute: 38 cells/uL (ref 0–200)
Basophils Relative: 0.6 %
Eosinophils Absolute: 262 cells/uL (ref 15–500)
Eosinophils Relative: 4.1 %
HCT: 35.9 % — ABNORMAL LOW (ref 38.5–50.0)
Hemoglobin: 12.5 g/dL — ABNORMAL LOW (ref 13.2–17.1)
Lymphs Abs: 3104 cells/uL (ref 850–3900)
MCH: 33.4 pg — ABNORMAL HIGH (ref 27.0–33.0)
MCHC: 34.8 g/dL (ref 32.0–36.0)
MCV: 96 fL (ref 80.0–100.0)
MPV: 10.2 fL (ref 7.5–12.5)
Monocytes Relative: 8.5 %
Neutro Abs: 2451 cells/uL (ref 1500–7800)
Neutrophils Relative %: 38.3 %
Platelets: 256 10*3/uL (ref 140–400)
RBC: 3.74 10*6/uL — ABNORMAL LOW (ref 4.20–5.80)
RDW: 14 % (ref 11.0–15.0)
Total Lymphocyte: 48.5 %
WBC: 6.4 10*3/uL (ref 3.8–10.8)

## 2020-10-28 LAB — COMPLETE METABOLIC PANEL WITH GFR
AG Ratio: 1 (calc) (ref 1.0–2.5)
ALT: 13 U/L (ref 9–46)
AST: 18 U/L (ref 10–40)
Albumin: 3.6 g/dL (ref 3.6–5.1)
Alkaline phosphatase (APISO): 71 U/L (ref 36–130)
BUN: 13 mg/dL (ref 7–25)
CO2: 26 mmol/L (ref 20–32)
Calcium: 9.1 mg/dL (ref 8.6–10.3)
Chloride: 105 mmol/L (ref 98–110)
Creat: 1.15 mg/dL (ref 0.60–1.35)
GFR, Est African American: 92 mL/min/{1.73_m2} (ref >=60)
GFR, Est Non African American: 79 mL/min/{1.73_m2} (ref >=60)
Globulin: 3.7 g/dL (calc) (ref 1.9–3.7)
Glucose, Bld: 90 mg/dL (ref 65–99)
Potassium: 4.1 mmol/L (ref 3.5–5.3)
Sodium: 139 mmol/L (ref 135–146)
Total Bilirubin: 0.5 mg/dL (ref 0.2–1.2)
Total Protein: 7.3 g/dL (ref 6.1–8.1)

## 2020-10-28 LAB — HIV-1 RNA QUANT-NO REFLEX-BLD
HIV 1 RNA Quant: 97 Copies/mL — ABNORMAL HIGH
HIV-1 RNA Quant, Log: 1.99 Log cps/mL — ABNORMAL HIGH

## 2020-10-31 ENCOUNTER — Encounter: Payer: Self-pay | Admitting: Physician Assistant

## 2020-11-08 ENCOUNTER — Ambulatory Visit (INDEPENDENT_AMBULATORY_CARE_PROVIDER_SITE_OTHER): Payer: Medicare Other

## 2020-11-08 ENCOUNTER — Other Ambulatory Visit: Payer: Self-pay | Admitting: *Deleted

## 2020-11-08 ENCOUNTER — Other Ambulatory Visit: Payer: Self-pay

## 2020-11-08 DIAGNOSIS — Z23 Encounter for immunization: Secondary | ICD-10-CM | POA: Diagnosis not present

## 2020-11-08 MED ORDER — METOPROLOL SUCCINATE ER 25 MG PO TB24: ORAL_TABLET | ORAL | 8 refills | Status: DC

## 2020-11-08 NOTE — Progress Notes (Signed)
   Covid-19 Vaccination Clinic  Name:  Rodney Walker    MRN: [id number] DOB: 01/17/1981  11/08/2020  [name] was observed post Covid-19 immunization for 15 minutes without incident. He was provided with Vaccine Information Sheet and instruction to access the V-Safe system.   [name] was instructed to call 911 with any severe reactions post vaccine: Marland Kitchen Difficulty breathing  . Swelling of face and throat  . A fast heartbeat  . A bad rash all over body  . Dizziness and weakness

## 2020-11-14 ENCOUNTER — Ambulatory Visit: Payer: Medicare Other | Admitting: Physician Assistant

## 2020-11-14 LAB — MYCOBACTERIA,CULT W/FLUOROCHROME SMEAR
MICRO NUMBER:: 11029516
SMEAR:: NONE SEEN
SPECIMEN QUALITY:: ADEQUATE

## 2020-11-17 ENCOUNTER — Other Ambulatory Visit: Payer: Self-pay | Admitting: Internal Medicine

## 2020-11-17 DIAGNOSIS — B20 Human immunodeficiency virus [HIV] disease: Secondary | ICD-10-CM

## 2020-11-18 ENCOUNTER — Other Ambulatory Visit: Payer: Self-pay | Admitting: *Deleted

## 2020-11-18 DIAGNOSIS — B20 Human immunodeficiency virus [HIV] disease: Secondary | ICD-10-CM

## 2020-11-18 MED ORDER — PREZCOBIX 800-150 MG PO TABS: ORAL_TABLET | ORAL | 4 refills | Status: DC

## 2020-11-18 MED ORDER — BIKTARVY 50-200-25 MG PO TABS: 1.0000 | ORAL_TABLET | Freq: Every day | ORAL | 4 refills | Status: DC

## 2020-11-27 ENCOUNTER — Other Ambulatory Visit: Payer: Self-pay

## 2020-11-27 ENCOUNTER — Encounter: Payer: Self-pay | Admitting: Pulmonary Disease

## 2020-11-27 ENCOUNTER — Ambulatory Visit (INDEPENDENT_AMBULATORY_CARE_PROVIDER_SITE_OTHER): Payer: Medicare Other | Admitting: Pulmonary Disease

## 2020-11-27 VITALS — BP 114/66 | HR 89 | Temp 97.8°F | Ht 65.0 in | Wt 134.4 lb

## 2020-11-27 DIAGNOSIS — J432 Centrilobular emphysema: Secondary | ICD-10-CM

## 2020-11-27 NOTE — Patient Instructions (Signed)
Will arrange for pulmonary function test and follow up after that in 6 to 8 weeks

## 2020-11-27 NOTE — Progress Notes (Signed)
[facility]  Chief Complaint  Patient presents with  . Consult    intermittent non productive     Constitutional:  BP 114/66 (BP Location: Left Arm, Cuff Size: Normal)   Pulse 89   Temp 97.8 F (36.6 C) (Other (Comment)) Comment (Src): wrist  Ht 5\' 5"  (1.651 m)   Wt 134 lb 6.4 oz (61 kg)   SpO2 98% Comment: Room air  BMI 22.37 kg/m   Past Medical History:  Anemia, HIV, CAD, Empyema, HLD, Ischemic CM  Past Surgical History:  His  has a past surgical history that includes Cardiac catheterization (06/24/2009); Cardiac catheterization (06/17/2009); 06/19/2009 ECHOCARDIOGRAPHY (12/09/2009); Video bronchoscopy (Bilateral, 09/05/2014); Video bronchoscopy (Bilateral, 04/10/2015); Video assisted thoracoscopy (vats)/ lobectomy (Right, 04/23/2015); LEFT HEART CATH AND CORONARY ANGIOGRAPHY (N/A, 03/24/2018); Coronary/Graft Acute MI Revascularization (N/A, 03/24/2018); CORONARY STENT INTERVENTION (N/A, 03/24/2018); Total hip arthroplasty (Right, 09/25/2019); and Joint replacement (N/A).  Brief Summary:  Rodney Walker is a 39 y.o. male former smoker with emphysema.  He continues to smoke marijuana.      Subjective:   Previously seen by Dr. 41.  He quit smoking about 5 yrs ago.  Is from [place] and moved to [place] about 20 yrs ago.  Not currently working.  Used to working washing dishes.  No animal/bird exposure.  Had several episodes of pneumonia before.    He gets occasional cough.  Not having sputum, fever, chest pain, or wheezing.  Does routine activity without feeling short of breath.  Not having skin rash, leg swelling, or joint swelling.  He smokes marijuana several times per day to help calm his nerves.   Physical Exam:   Appearance - well kempt   ENMT - no sinus tenderness, no oral exudate, no LAN, Mallampati 2 airway, no stridor  Respiratory - decreased breath sounds bilaterally, no wheezing or rales  CV - s1s2 regular rate and  rhythm, no murmurs  Ext - no clubbing, no edema  Skin - no rashes  Psych - normal mood and affect   Pulmonary testing:   A1AT 09/01/14 >> 140  Spirometry 10/29/16 >> FEV1 2.02 (73%), FEV1% 64  Chest Imaging:    Cardiac Tests:   Echo 03/25/18 >> EF 40 to 45%  Social History:  He  reports that he quit smoking about 5 years ago. His smoking use included cigarettes and cigars. He has a 4.25 pack-year smoking history. He has never used smokeless tobacco. He reports current drug use. Frequency: 7.00 times per week. Drug: Marijuana. He reports that he does not drink alcohol.  Family History:  His family history includes Asthma in his brother and mother; COPD in his mother; Diabetes in his mother; Eczema in his mother; Heart disease in his mother; Hyperlipidemia in his mother; Hypertension in his mother; Liver cancer in his father; Sleep apnea in his mother; Stroke in his mother.     Assessment/Plan:   Emphysema. - history of tobacco and marijuana abuse, and HIV - continue prn albuterol for now - will arrange for pulmonary function test to assess further  HIV. - followed by Dr. 03/27/18 with [facility] for Infectious Disease  Coronary artery disease. - followed by [facility] with [facility]  Time Spent Involved in Patient Care on Day of Examination:  36 minutes  Follow up:  Patient Instructions  Will arrange for pulmonary function test and follow up after that in 6 to 8 weeks   Medication List:  Allergies as of 11/27/2020   No Known Allergies     Medication List       Accurate as of November 27, 2020  5:13 PM. If you have any questions, ask your nurse or doctor.        albuterol 108 (90 Base) MCG/ACT inhaler Commonly known as: VENTOLIN HFA Inhale 2 puffs into the lungs every 6 (six) hours as needed for wheezing or shortness of breath.   aspirin EC 81 MG tablet Take 1 tablet (81 mg total) by mouth 2 (two) times daily.   Biktarvy  50-200-25 MG Tabs tablet Generic drug: bictegravir-emtricitabine-tenofovir AF Take 1 tablet by mouth daily.   cetirizine 10 MG tablet Commonly known as: ZYRTEC TAKE 1 TABLET(10 MG) BY MOUTH DAILY   HYDROcodone-acetaminophen 5-325 MG tablet Commonly known as: NORCO/VICODIN Take 1 tablet by mouth every 6 (six) hours as needed.   hydrocortisone 2.5 % rectal cream Commonly known as: Anusol-HC Place 1 application rectally 2 (two) times daily.   hydrocortisone 25 MG suppository Commonly known as: ANUSOL-HC Place 1 suppository (25 mg total) rectally 2 (two) times daily.   metoprolol succinate 25 MG 24 hr tablet Commonly known as: TOPROL-XL TAKE 1 TABLET(25 MG) BY MOUTH DAILY.   nitroGLYCERIN 0.4 MG SL tablet Commonly known as: NITROSTAT Place 1 tablet (0.4 mg total) under the tongue every 5 (five) minutes x 3 doses as needed for chest pain.   prasugrel 10 MG Tabs tablet Commonly known as: Effient Take 1 tablet (10 mg total) by mouth daily.   Prezcobix 800-150 MG tablet Generic drug: darunavir-cobicistat TAKE 1 TABLET BY MOUTH DAILY WITH BREAKFAST. SWALLOW WHOLE. DO NOT CRUSH, BREAK OR CHEW TABLETS.TAKE WITH FOOD   rosuvastatin 10 MG tablet Commonly known as: Crestor Take 1 tablet (10 mg total) by mouth daily.   Vitamin D3 125 MCG (5000 UT) Caps Take 1 capsule (5,000 Units total) by mouth daily.       Signature:  Coralyn Helling, MD [facility] Pager - [phone] 11/27/2020, 5:13 PM

## 2020-12-03 NOTE — Progress Notes (Signed)
ASSESSMENT AND PLAN    #  39 yo male with HIV, COPD / spontaneous dissection of LAD / CAD / MI x 2 in 2019 s/p stenting /  ischemic cardiomyopathy, hyperlipidemia, right hip arthroplasty   # Intermittent rectal pain and bleeding since March 2021, now resolved after using topical steroids. Symptoms in the setting in constipation and secondary to hemorrhoids and / or anal fissure but need to exclude other etiologies such as colon neoplasm.   --Patient will be scheduled for a colonoscopy off Effient. The risks and benefits of colonoscopy with possible polypectomy / biopsies were discussed and the patient agrees to proceed.  --Treat constipation  # CAD / MI x2 on Effient.  --Hold Effient for 7 days before procedure - will instruct when and how to resume after procedure. Patient understands that there is a low but real risk of cardiovascular event such as heart attack, stroke, or embolism /  thrombosis, or ischemia while off Effient. The patient consents to proceed. Will communicate by phone or EMR with patient's prescribing provider to confirm that holding Effient  is reasonable in this case.  # Constipation with occasional straining and hard stool --Start daily Benefiber   # Mild, intermittent [place] anemia. Hgb in late October was at baseline of 12.5.   # HIV, on treatment.   HISTORY OF PRESENT ILLNESS     Primary Gastroenterologist : [facility] - Erick Blinks, MD  Chief Complaint : rectal bleeding  Rodney Walker is a 39 y.o. male with PMH / PSH significant for,  but not necessarily limited to: HIV, emphysema,  spontaneous dissection of LAD, CAD / MI x 2 in 2019 s/p stenting, ischemic cardiomyopathy, hyperlipidemia, right hip arthroplasty   Patient referred by Judyann Munson, MD for rectal bleeding. Baseline hgb 12-13. He takes for CAD / hx of MI x2 s/p stenting.  Patient gives a history of "hemorrhoidal bleeding" associated with rectal pain  Since around March 2021. Blood was bright  red,  in toilet bowl. Initially he was using a non-prescription hemorrhoid cream but at some point was given a prescription for steroid cream / suppositories. He hasn't seen any blood or had any rectal pain in the last several weeks. Patient says he occasionally strains to have a BM and stool are sometimes hard. He tends to sit on the toilet for an excessive amount of time. He uses fleets enemas QOD just to clean himself out because he doesn't like having bowel movements.  A few months ago he started eating a high fiber diet but hasn't noticed any change in bowel movements.  No FMH of colon cancer. He reports weight loss around the time he was having rectal pain a few month ago. He limited food intake to hopes of limiting frequency of BMs to avoid pain and bleeding. He has since regained weight.   Data Reviewed:  WBC 6.4, hgb 12.5   Previous Endoscopic Evaluations / Pertinent Studies:  none  Past Medical History:  Diagnosis Date  . Anemia   . COPD (chronic obstructive pulmonary disease) (HCC)   . Coronary artery disease    MI 2010 - dissection/plaque rupture in LAD tx with antiplt/antithrombotics >> relook cath with resolution (no PCI performed) // Inf STEMI 3/19: OM1 30; pRCA 100 >> DES // Echo 3/19:  mild LVH, EF 40-45, inf/inf-sept HK, trivial MR  . Eczema   . History of myocardial infarction 2010; 2019  . History of pleural empyema   . HIV infection (  HCC)   . Hyperlipidemia   . Immune deficiency disorder (HCC)   . Ischemic cardiomyopathy 04/05/2018  . Moderate persistent asthma without complication 10/29/2016  . Myocardial infarction (HCC)    2009, 2019  . Pneumonia      Past Surgical History:  Procedure Laterality Date  . CARDIAC CATHETERIZATION  06/24/2009   EF 60%  . CARDIAC CATHETERIZATION  06/17/2009   EF 45%. ANTERIOR HYPOKINESIS  . CORONARY STENT INTERVENTION N/A 03/24/2018   Procedure: CORONARY STENT INTERVENTION;  Surgeon: Tonny Bollman, MD;  Location: [facility] INVASIVE CV LAB;   Service: Cardiovascular;  Laterality: N/A;  . CORONARY/GRAFT ACUTE MI REVASCULARIZATION N/A 03/24/2018   Procedure: Coronary/Graft Acute MI Revascularization;  Surgeon: Tonny Bollman, MD;  Location: [facility] INVASIVE CV LAB;  Service: Cardiovascular;  Laterality: N/A;  . JOINT REPLACEMENT N/A    Phreesia 10/01/2020  . LEFT HEART CATH AND CORONARY ANGIOGRAPHY N/A 03/24/2018   Procedure: LEFT HEART CATH AND CORONARY ANGIOGRAPHY;  Surgeon: Tonny Bollman, MD;  Location[facility]VASIVE CV LAB;  Service: Cardiovascular;  Laterality: N/A;  . TOTAL HIP ARTHROPLASTY Right 09/25/2019   Procedure: RIGHT TOTAL HIP ARTHROPLASTY ANTERIOR APPROACH;  Surgeon: Tarry Kos, MD;  Location: [facility] OR;  Service: Orthopedics;  Laterality: Right;  . US ECHOCARDIOGRAPHY  12/09/2009   EF 55-60%  . VIDEO ASSISTED THORACOSCOPY (VATS)/ LOBECTOMY Right 04/23/2015   Procedure: VIDEO ASSISTED THORACOSCOPY (VATS)/RIGHT upper  and right middle LOBECTOMY;  Surgeon: Kerin Perna, MD;  Location: [facility] OR;  Service: Thoracic;  Laterality: Right;  Marland Kitchen VIDEO BRONCHOSCOPY Bilateral 09/05/2014   Procedure: VIDEO BRONCHOSCOPY WITH FLUORO;  Surgeon: Merwyn Katos, MD;  Location: [facility] ENDOSCOPY;  Service: Cardiopulmonary;  Laterality: Bilateral;  . VIDEO BRONCHOSCOPY Bilateral 04/10/2015   Procedure: VIDEO BRONCHOSCOPY WITH FLUORO;  Surgeon: Lupita Leash, MD;  Location: [facility] ENDOSCOPY;  Service: Cardiopulmonary;  Laterality: Bilateral;   Family History  Problem Relation Age of Onset  . Diabetes Mother   . Eczema Mother   . Asthma Mother   . COPD Mother   . Hypertension Mother   . Hyperlipidemia Mother   . Stroke Mother   . Heart disease Mother   . Sleep apnea Mother   . Liver cancer Father   . Asthma Brother    Social History   Tobacco Use  . Smoking status: Former Smoker    Packs/day: 0.25    Years: 17.00    Pack years: 4.25    Types: Cigarettes, Cigars    Quit date: 03/23/2015    Years since quitting: 5.7  . Smokeless tobacco:  Never Used  Vaping Use  . Vaping Use: Never used  Substance Use Topics  . Alcohol use: No    Alcohol/week: 0.0 standard drinks  . Drug use: Yes    Frequency: 7.0 times per week    Types: Marijuana   Current Outpatient Medications  Medication Sig Dispense Refill  . albuterol (VENTOLIN HFA) 108 (90 Base) MCG/ACT inhaler Inhale 2 puffs into the lungs every 6 (six) hours as needed for wheezing or shortness of breath. 8 g 2  . aspirin EC 81 MG tablet Take 1 tablet (81 mg total) by mouth 2 (two) times daily. 84 tablet 0  . bictegravir-emtricitabine-tenofovir AF (BIKTARVY) 50-200-25 MG TABS tablet Take 1 tablet by mouth daily. 30 tablet 4  . cetirizine (ZYRTEC) 10 MG tablet TAKE 1 TABLET(10 MG) BY MOUTH DAILY 30 tablet 3  . Cholecalciferol (VITAMIN D3) 125 MCG (5000 UT) CAPS Take 1 capsule (5,000 Units  total) by mouth daily. (Patient not taking: Reported on 11/27/2020) 30 capsule 2  . darunavir-cobicistat (PREZCOBIX) 800-150 MG tablet TAKE 1 TABLET BY MOUTH DAILY WITH BREAKFAST. SWALLOW WHOLE. DO NOT CRUSH, BREAK OR CHEW TABLETS.TAKE WITH FOOD 30 tablet 4  . HYDROcodone-acetaminophen (NORCO/VICODIN) 5-325 MG tablet Take 1 tablet by mouth every 6 (six) hours as needed. (Patient not taking: Reported on 10/23/2020) 10 tablet 0  . hydrocortisone (ANUSOL-HC) 2.5 % rectal cream Place 1 application rectally 2 (two) times daily. 30 g 3  . hydrocortisone (ANUSOL-HC) 25 MG suppository Place 1 suppository (25 mg total) rectally 2 (two) times daily. (Patient not taking: Reported on 10/23/2020) 12 suppository 1  . metoprolol succinate (TOPROL-XL) 25 MG 24 hr tablet TAKE 1 TABLET(25 MG) BY MOUTH DAILY. 30 tablet 8  . nitroGLYCERIN (NITROSTAT) 0.4 MG SL tablet Place 1 tablet (0.4 mg total) under the tongue every 5 (five) minutes x 3 doses as needed for chest pain. 25 tablet 0  . prasugrel (EFFIENT) 10 MG TABS tablet Take 1 tablet (10 mg total) by mouth daily. 90 tablet 3  . rosuvastatin (CRESTOR) 10 MG tablet Take  1 tablet (10 mg total) by mouth daily. 90 tablet 3   No current facility-administered medications for this visit.   No Known Allergies   Review of Systems:  All systems reviewed and negative except where noted in HPI.   PHYSICAL EXAM :    Wt Readings from Last 3 Encounters:  11/27/20 134 lb 6.4 oz (61 kg)  10/23/20 128 lb (58.1 kg)  10/02/20 126 lb (57.2 kg)    BP 92/60 (BP Location: Left Arm, Patient Position: Sitting, Cuff Size: Normal)   Pulse 80   Ht 5' 4.5" (1.638 m) Comment: height measured without shoes  Wt 135 lb 6 oz (61.4 kg)   BMI 22.88 kg/m  Constitutional:  Pleasant thin male in no acute distress. Psychiatric: Normal mood and affect. Behavior is normal. EENT: Pupils normal.  Conjunctivae are normal. No scleral icterus. Neck supple.  Cardiovascular: Normal rate, regular rhythm. No edema Pulmonary/chest: Effort normal and breath sounds normal. No wheezing, rales or rhonchi. Abdominal: Soft, nondistended, nontender. Bowel sounds active throughout. There are no masses palpable. No hepatomegaly. Neurological: Alert and oriented to person place and time. Skin: Skin is warm and dry. No rashes noted.  Willette Cluster, NP  12/03/2020, 7:57 PM  Cc:  Referring Provider Judyann Munson, MD

## 2020-12-04 ENCOUNTER — Telehealth: Payer: Self-pay

## 2020-12-04 ENCOUNTER — Encounter: Payer: Self-pay | Admitting: Nurse Practitioner

## 2020-12-04 ENCOUNTER — Ambulatory Visit (INDEPENDENT_AMBULATORY_CARE_PROVIDER_SITE_OTHER): Payer: Medicare Other | Admitting: Nurse Practitioner

## 2020-12-04 VITALS — BP 92/60 | HR 80 | Ht 64.5 in | Wt 135.4 lb

## 2020-12-04 DIAGNOSIS — K5900 Constipation, unspecified: Secondary | ICD-10-CM | POA: Diagnosis not present

## 2020-12-04 DIAGNOSIS — Z7901 Long term (current) use of anticoagulants: Secondary | ICD-10-CM | POA: Diagnosis not present

## 2020-12-04 DIAGNOSIS — K625 Hemorrhage of anus and rectum: Secondary | ICD-10-CM

## 2020-12-04 MED ORDER — SUTAB 1479-225-188 MG PO TABS: 1.0000 | ORAL_TABLET | ORAL | 0 refills | Status: DC

## 2020-12-04 NOTE — Telephone Encounter (Signed)
   Primary Cardiologist: Kristeen Miss, MD  Chart reviewed as part of pre-operative protocol coverage. Patient has colonoscopy planned for 12/29/2021and we were asked to give our recommendations for holding Effient.   Dr. Elease Hashimoto has previously stated Effient could be held for 7 days prior to a hip surgery (office visit note from 11/11/2018). Patient has had no new cardiac events since that time and no new PCIs. Therefore, same recommendation can be applied to upcoming colonoscopy. Please restart Effient as soon as possible following procedure.   I will route this recommendation to the requesting party via Epic fax function and remove from pre-op pool.  Please call with questions.  Corrin Parker, PA-C 12/04/2020, 12:36 PM

## 2020-12-04 NOTE — Patient Instructions (Signed)
If you are age 40 or older, your body mass index should be between 23-30. Your Body mass index is 22.88 kg/m. If this is out of the aforementioned range listed, please consider follow up with your Primary Care Provider.  If you are age 47 or younger, your body mass index should be between 19-25. Your Body mass index is 22.88 kg/m. If this is out of the aformentioned range listed, please consider follow up with your Primary Care Provider.   You have been scheduled for a colonoscopy. Please follow written instructions given to you at your visit today.  Please pick up your prep supplies at the pharmacy within the next 1-3 days. If you use inhalers (even only as needed), please bring them with you on the day of your procedure.  You will be contacted by our office prior to your procedure for directions on holding your Effient.  If you do not hear from our office 1 week prior to your scheduled procedure, please call [phone] to discuss.   Start daily Benefiber as directed.  Follow up pending your Colonoscopy.  Thank you for entrusting me with your care and choosing [facility].  Willette Cluster, NP

## 2020-12-04 NOTE — Telephone Encounter (Signed)
[facility] Pre-operative Risk Assessment     Request for surgical clearance:     Endoscopy Procedure  What type of surgery is being performed?     Colonoscopy  When is this surgery scheduled?     12/25/20  What type of clearance is required ?   Pharmacy  Are there any medications that need to be held prior to surgery and how long? Effient starting 7 days prior  Practice name and name of physician performing surgery?      [facility]  What is your office phone and fax number?      Phone- [phone]  [phone]  Anesthesia type (None, local, MAC, general) ?       MAC

## 2020-12-09 NOTE — Progress Notes (Signed)
Addendum: Reviewed and agree with assessment and management plan. Darin Redmann M, MD  

## 2020-12-11 NOTE — Telephone Encounter (Signed)
Patient has been notified when to stop his Effient and that he will be advised when to restart after his procedure.

## 2020-12-19 ENCOUNTER — Other Ambulatory Visit: Payer: Self-pay

## 2020-12-19 DIAGNOSIS — B20 Human immunodeficiency virus [HIV] disease: Secondary | ICD-10-CM

## 2020-12-19 DIAGNOSIS — Z79899 Other long term (current) drug therapy: Secondary | ICD-10-CM

## 2020-12-19 DIAGNOSIS — Z113 Encounter for screening for infections with a predominantly sexual mode of transmission: Secondary | ICD-10-CM

## 2020-12-23 ENCOUNTER — Other Ambulatory Visit: Payer: Medicare Other

## 2020-12-23 ENCOUNTER — Other Ambulatory Visit: Payer: Self-pay

## 2020-12-23 DIAGNOSIS — B20 Human immunodeficiency virus [HIV] disease: Secondary | ICD-10-CM

## 2020-12-23 DIAGNOSIS — Z79899 Other long term (current) drug therapy: Secondary | ICD-10-CM

## 2020-12-24 LAB — T-HELPER CELL (CD4) - (RCID CLINIC ONLY)
CD4 % Helper T Cell: 24 % — ABNORMAL LOW (ref 33–65)
CD4 T Cell Abs: 685 /uL (ref 400–1790)

## 2020-12-25 ENCOUNTER — Encounter: Payer: Medicare Other | Admitting: Internal Medicine

## 2021-01-03 LAB — CBC WITH DIFFERENTIAL/PLATELET
Absolute Monocytes: 648 cells/uL (ref 200–950)
Basophils Absolute: 16 cells/uL (ref 0–200)
Basophils Relative: 0.2 %
Eosinophils Absolute: 82 cells/uL (ref 15–500)
Eosinophils Relative: 1 %
HCT: 39.2 % (ref 38.5–50.0)
Hemoglobin: 13.8 g/dL (ref 13.2–17.1)
Lymphs Abs: 3280 cells/uL (ref 850–3900)
MCH: 34.5 pg — ABNORMAL HIGH (ref 27.0–33.0)
MCHC: 35.2 g/dL (ref 32.0–36.0)
MCV: 98 fL (ref 80.0–100.0)
MPV: 10.3 fL (ref 7.5–12.5)
Monocytes Relative: 7.9 %
Neutro Abs: 4174 cells/uL (ref 1500–7800)
Neutrophils Relative %: 50.9 %
Platelets: 226 10*3/uL (ref 140–400)
RBC: 4 10*6/uL — ABNORMAL LOW (ref 4.20–5.80)
RDW: 13.1 % (ref 11.0–15.0)
Total Lymphocyte: 40 %
WBC: 8.2 10*3/uL (ref 3.8–10.8)

## 2021-01-03 LAB — COMPLETE METABOLIC PANEL WITH GFR
AG Ratio: 1.3 (calc) (ref 1.0–2.5)
ALT: 14 U/L (ref 9–46)
AST: 17 U/L (ref 10–40)
Albumin: 4 g/dL (ref 3.6–5.1)
Alkaline phosphatase (APISO): 71 U/L (ref 36–130)
BUN: 14 mg/dL (ref 7–25)
CO2: 28 mmol/L (ref 20–32)
Calcium: 9.1 mg/dL (ref 8.6–10.3)
Chloride: 107 mmol/L (ref 98–110)
Creat: 1.1 mg/dL (ref 0.60–1.35)
GFR, Est African American: 97 mL/min/{1.73_m2} (ref >=60)
GFR, Est Non African American: 84 mL/min/{1.73_m2} (ref >=60)
Globulin: 3.2 g/dL (calc) (ref 1.9–3.7)
Glucose, Bld: 97 mg/dL (ref 65–99)
Potassium: 3.5 mmol/L (ref 3.5–5.3)
Sodium: 141 mmol/L (ref 135–146)
Total Bilirubin: 0.4 mg/dL (ref 0.2–1.2)
Total Protein: 7.2 g/dL (ref 6.1–8.1)

## 2021-01-03 LAB — LIPID PANEL
Cholesterol: 152 mg/dL (ref <200)
HDL: 51 mg/dL (ref >=40)
LDL Cholesterol (Calc): 87 mg/dL (calc)
Non-HDL Cholesterol (Calc): 101 mg/dL (calc) (ref <130)
Total CHOL/HDL Ratio: 3 (calc) (ref <5.0)
Triglycerides: 55 mg/dL (ref <150)

## 2021-01-03 LAB — HIV-1 RNA QUANT-NO REFLEX-BLD
HIV 1 RNA Quant: 24 Copies/mL — ABNORMAL HIGH
HIV-1 RNA Quant, Log: 1.37 Log cps/mL — ABNORMAL HIGH

## 2021-01-06 ENCOUNTER — Ambulatory Visit: Payer: Medicare Other | Admitting: Internal Medicine

## 2021-01-07 ENCOUNTER — Ambulatory Visit: Payer: Medicare Other | Admitting: Primary Care

## 2021-01-28 ENCOUNTER — Ambulatory Visit: Payer: Medicare Other | Admitting: Internal Medicine

## 2021-02-12 ENCOUNTER — Telehealth: Payer: Self-pay

## 2021-02-12 ENCOUNTER — Other Ambulatory Visit: Payer: Self-pay

## 2021-02-12 ENCOUNTER — Ambulatory Visit (INDEPENDENT_AMBULATORY_CARE_PROVIDER_SITE_OTHER): Payer: Medicare Other | Admitting: Internal Medicine

## 2021-02-12 ENCOUNTER — Encounter: Payer: Self-pay | Admitting: Internal Medicine

## 2021-02-12 VITALS — BP 112/73 | HR 86 | Temp 98.2°F | Wt 138.0 lb

## 2021-02-12 DIAGNOSIS — I2510 Atherosclerotic heart disease of native coronary artery without angina pectoris: Secondary | ICD-10-CM | POA: Diagnosis not present

## 2021-02-12 DIAGNOSIS — B20 Human immunodeficiency virus [HIV] disease: Secondary | ICD-10-CM

## 2021-02-12 DIAGNOSIS — Z79899 Other long term (current) drug therapy: Secondary | ICD-10-CM | POA: Diagnosis not present

## 2021-02-12 DIAGNOSIS — J431 Panlobular emphysema: Secondary | ICD-10-CM | POA: Diagnosis not present

## 2021-02-12 MED ORDER — PREZCOBIX 800-150 MG PO TABS: ORAL_TABLET | ORAL | 11 refills | Status: DC

## 2021-02-12 MED ORDER — BIKTARVY 50-200-25 MG PO TABS: 1.0000 | ORAL_TABLET | Freq: Every day | ORAL | 11 refills | Status: DC

## 2021-02-12 NOTE — Progress Notes (Signed)
RFV: follow up for hiv disease  Patient ID: Rodney Walker, male   DOB: 05/18/1981, 40 y.o.   MRN: [id number]  HPI CD 4 count of 685/VL 24 in De 2021 on biktarvy-DRVc. Taking biktarvy daily - only missing 2 doses. 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   Has new pcp helping with management CAD.  Had so many bills to pay off. Somewhat stressful.  uptodate with covid vaccine   Outpatient Encounter Medications as of 02/12/2021  Medication Sig  . albuterol (VENTOLIN HFA) 108 (90 Base) MCG/ACT inhaler Inhale 2 puffs into the lungs every 6 (six) hours as needed for wheezing or shortness of breath.  Marland Kitchen aspirin EC 81 MG tablet Take 1 tablet (81 mg total) by mouth 2 (two) times daily.  . bictegravir-emtricitabine-tenofovir AF (BIKTARVY) 50-200-25 MG TABS tablet Take 1 tablet by mouth daily.  . cetirizine (ZYRTEC) 10 MG tablet TAKE 1 TABLET(10 MG) BY MOUTH DAILY  . Cholecalciferol (VITAMIN D3) 125 MCG (5000 UT) CAPS Take 1 capsule (5,000 Units total) by mouth daily.  . darunavir-cobicistat (PREZCOBIX) 800-150 MG tablet TAKE 1 TABLET BY MOUTH DAILY WITH BREAKFAST. SWALLOW WHOLE. DO NOT CRUSH, BREAK OR CHEW TABLETS.TAKE WITH FOOD  . hydrocortisone (ANUSOL-HC) 2.5 % rectal cream Place 1 application rectally 2 (two) times daily.  . metoprolol succinate (TOPROL-XL) 25 MG 24 hr tablet TAKE 1 TABLET(25 MG) BY MOUTH DAILY.  . prasugrel (EFFIENT) 10 MG TABS tablet Take 1 tablet (10 mg total) by mouth daily.  . rosuvastatin (CRESTOR) 10 MG tablet Take 1 tablet (10 mg total) by mouth daily.  . Sodium Sulfate-Mag Sulfate-KCl (SUTAB) [phone] MG TABS Take 1 kit by mouth as directed. MANUFACTURER CODES!! BIN: [id number] PCN: CN GROUP: [id number] MEMBER ID: [id number];GUY AS SECONDARY INSURANCE ;NO PRIOR AUTHORIZATION  . nitroGLYCERIN (NITROSTAT) 0.4 MG SL tablet Place 1 tablet (0.4  mg total) under the tongue every 5 (five) minutes x 3 doses as needed for chest pain. (Patient not taking: No sig reported)   No facility-administered encounter medications on file as of 02/12/2021.     Patient Active Problem List   Diagnosis Date Noted  . Chest pain 09/30/2020  . Hemorrhoid 09/30/2020  . Avascular necrosis of bone of right hip ([place]) 09/25/2019  . 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 Hyperlipidemia 04/05/2018  . Ischemic cardiomyopathy 04/05/2018  . History of acute inferior wall MI 03/24/2018  . Marijuana abuse, continuous 10/29/2016  . Moderate persistent asthma without complication 40/34/7425  . Allergic rhinitis 10/08/2016  . Anemia 05/03/2015  . Lung abscess ([place]) 04/22/2015  . Bullous emphysema ([place])   . Lobar pneumonia ([place])   . Empyema lung ([place]) 04/16/2015  . HIV (human immunodeficiency virus infection) ([place])   . Tobacco abuse 03/21/2015  . Lung mass 08/31/2014  . 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 Myalgia 04/23/2014  . AIDS ([place]) 03/22/2014  . CAP (community acquired pneumonia) 03/22/2014  . CAD (coronary artery disease) 11/13/2011     There are no preventive care reminders to display for this patient.   Review of Systems Review of Systems  Constitutional: Negative for fever, chills, diaphoresis, activity change, appetite change, fatigue and unexpected weight change.  HENT: Negative for congestion, sore throat, rhinorrhea, sneezing, trouble swallowing and sinus pressure.  Eyes: Negative for photophobia and visual disturbance.  Respiratory: Negative for cough, chest tightness, shortness  of breath, wheezing and stridor.  Cardiovascular: Negative for chest pain, palpitations and leg swelling.  Gastrointestinal: Negative for nausea, vomiting, abdominal pain, diarrhea, constipation, blood in stool, abdominal distention and anal bleeding.  Genitourinary: Negative for dysuria, hematuria, flank pain and difficulty urinating.   Musculoskeletal: Negative for myalgias, back pain, joint swelling, arthralgias and gait problem.  Skin: Negative for color change, pallor, rash and wound.  Neurological: Negative for dizziness, tremors, weakness and light-headedness.  Hematological: Negative for adenopathy. Does not bruise/bleed easily.  Psychiatric/Behavioral: Negative for behavioral problems, confusion, sleep disturbance, dysphoric mood, decreased concentration and agitation.    Physical Exam   BP 112/73   Pulse 86   Temp 98.2 F (36.8 C) (Oral)   Wt 138 lb (62.6 kg)   BMI 23.32 kg/m   Physical Exam  Constitutional: He is oriented to person, place, and time. He appears well-developed and well-nourished. No distress.  HENT:  Mouth/Throat: Oropharynx is clear and moist. No oropharyngeal exudate.  Cardiovascular: Normal rate, regular rhythm and normal heart sounds. Exam reveals no gallop and no friction rub.  No murmur heard.  Pulmonary/Chest: Effort normal and breath sounds normal. No respiratory distress. He has no wheezes.  Abdominal: Soft. Bowel sounds are normal. He exhibits no distension. There is no tenderness.  Lymphadenopathy:  He has no cervical adenopathy.  Neurological: He is alert and oriented to person, place, and time.  Ext: congenital clubbing +3 Skin: Skin is warm and dry. No rash noted. No erythema.  Psychiatric: He has a normal mood and affect. His behavior is normal.    Lab Results  Component Value Date   CD4TCELL 24 (L) 12/23/2020   Lab Results  Component Value Date   CD4TABS 685 12/23/2020   CD4TABS 621 10/23/2020   CD4TABS 762 07/03/2020   Lab Results  Component Value Date   HIV1RNAQUANT 24 (H) 12/23/2020   No results found for: HEPBSAB Lab Results  Component Value Date   LABRPR REACTIVE (A) 07/03/2020    CBC Lab Results  Component Value Date   WBC 8.2 12/23/2020   RBC 4.00 (L) 12/23/2020   HGB 13.8 12/23/2020   HCT 39.2 12/23/2020   PLT 226 12/23/2020   MCV 98.0  12/23/2020   MCH 34.5 (H) 12/23/2020   MCHC 35.2 12/23/2020   RDW 13.1 12/23/2020   LYMPHSABS 3,280 12/23/2020   MONOABS 0.7 09/19/2019   EOSABS 82 12/23/2020    BMET Lab Results  Component Value Date   NA 141 12/23/2020   K 3.5 12/23/2020   CL 107 12/23/2020   CO2 28 12/23/2020   GLUCOSE 97 12/23/2020   BUN 14 12/23/2020   CREATININE 1.10 12/23/2020   CALCIUM 9.1 12/23/2020   GFRNONAA 84 12/23/2020   GFRAA 97 12/23/2020    Assessment and Plan   Emphysema/COPD = not needing his inhaler as much. Discussed to call if needs revill  hiv disease= well controlled. Continue on biktarvy-DRVc. Gave refills.  CAD = on asa, effient, and rosuvastatin, and metoprolol  Long term medication management = cr is stable on recent labs

## 2021-02-12 NOTE — Telephone Encounter (Signed)
Error

## 2021-02-27 ENCOUNTER — Other Ambulatory Visit: Payer: Self-pay | Admitting: Internal Medicine

## 2021-04-03 ENCOUNTER — Ambulatory Visit: Payer: Self-pay | Admitting: Emergency Medicine

## 2021-04-03 ENCOUNTER — Encounter: Payer: Self-pay | Admitting: Emergency Medicine

## 2021-04-03 NOTE — Progress Notes (Deleted)
Assessment and Plan   Emphysema/COPD = not needing his inhaler as much. Discussed to call if needs revill  hiv disease= well controlled. Continue on biktarvy-DRVc. Gave refills.  CAD = on asa, effient, and rosuvastatin, and metoprolol  Long term medication management = cr is stable on recent labs  Assessment/Plan:   Emphysema. - history of tobacco and marijuana abuse, and HIV - continue prn albuterol for now - will arrange for pulmonary function test to assess further  HIV. - followed by Dr. Judyann Munson with [facility]  Coronary artery disease. - followed by Dr. Kristeen Miss with [facility]

## 2021-04-04 ENCOUNTER — Encounter: Payer: Self-pay | Admitting: Emergency Medicine

## 2021-05-29 ENCOUNTER — Other Ambulatory Visit: Payer: Self-pay | Admitting: Cardiovascular Disease

## 2021-05-29 ENCOUNTER — Other Ambulatory Visit: Payer: Self-pay | Admitting: Internal Medicine

## 2021-05-30 ENCOUNTER — Encounter (HOSPITAL_COMMUNITY): Payer: Self-pay

## 2021-05-30 ENCOUNTER — Other Ambulatory Visit: Payer: Self-pay

## 2021-05-30 ENCOUNTER — Ambulatory Visit (HOSPITAL_COMMUNITY)
Admission: EM | Admit: 2021-05-30 | Discharge: 2021-05-30 | Disposition: A | Discharge status: Home / Self Care | Payer: Medicare Other | Attending: Physician Assistant | Admitting: Physician Assistant

## 2021-05-30 DIAGNOSIS — Z87891 Personal history of nicotine dependence: Secondary | ICD-10-CM | POA: Diagnosis not present

## 2021-05-30 DIAGNOSIS — Z20822 Contact with and (suspected) exposure to covid-19: Secondary | ICD-10-CM | POA: Insufficient documentation

## 2021-05-30 DIAGNOSIS — J40 Bronchitis, not specified as acute or chronic: Secondary | ICD-10-CM | POA: Diagnosis not present

## 2021-05-30 DIAGNOSIS — R059 Cough, unspecified: Secondary | ICD-10-CM | POA: Diagnosis not present

## 2021-05-30 DIAGNOSIS — Z79899 Other long term (current) drug therapy: Secondary | ICD-10-CM | POA: Diagnosis not present

## 2021-05-30 DIAGNOSIS — B20 Human immunodeficiency virus [HIV] disease: Secondary | ICD-10-CM | POA: Diagnosis not present

## 2021-05-30 DIAGNOSIS — J449 Chronic obstructive pulmonary disease, unspecified: Secondary | ICD-10-CM | POA: Diagnosis not present

## 2021-05-30 DIAGNOSIS — Z7982 Long term (current) use of aspirin: Secondary | ICD-10-CM | POA: Diagnosis not present

## 2021-05-30 DIAGNOSIS — J329 Chronic sinusitis, unspecified: Secondary | ICD-10-CM | POA: Diagnosis not present

## 2021-05-30 MED ORDER — DOXYCYCLINE HYCLATE 100 MG PO CAPS: 100.0000 mg | ORAL_CAPSULE | Freq: Two times a day (BID) | ORAL | 0 refills | Status: DC

## 2021-05-30 NOTE — ED Triage Notes (Signed)
Pt c/o congestion, cough and green mucus. Symptoms began about 2 weeks ago. Home test negative 3 days ago.

## 2021-05-30 NOTE — ED Provider Notes (Signed)
[facility]    CSN: [id number] Arrival date & time: 05/30/21  1355      History   Chief Complaint Chief Complaint  Patient presents with  . Nasal Congestion  . Cough    HPI Rodney Walker is a 40 y.o. male.   Patient presents today with a 2-week history of productive cough.  He reports sputum is thick and colored (green/yellow).  He reports associated sinus pressure, headache, drainage.  Denies any chest pain, shortness of breath, fever, nausea, vomiting.  Denies any known sick contacts.  He is up-to-date on COVID-19 vaccinations including 2 boosters.  He denies recent flu shot.  Denies any recent antibiotic use.  He does have a history of COPD and has been taking albuterol as prescribed.  Denies increased use of albuterol since symptom onset.  He is a current everyday smoker of marijuana but denies cigarette use.  He does have a complicated past medical history including HIV.  Reports CD4 counts and viral load have been at goal based on last blood work and he has been taking antivirals as prescribed without missing doses.  He has tried over-the-counter cough medication without improvement of symptoms.  He has taken at home COVID test that were negative.  He is requesting a COVID-19 test today to satisfy employer's requirement.     Past Medical History:  Diagnosis Date  . Anemia   . COPD (chronic obstructive pulmonary disease) ([place])   . Coronary artery disease    MI 2010 - dissection/plaque rupture in LAD tx with antiplt/antithrombotics >> relook cath with resolution (no PCI performed) // Inf STEMI 3/19: OM1 30; pRCA 100 >> DES // Echo 3/19:  mild LVH, EF 40-45, inf/inf-sept HK, trivial MR  . Eczema   . History of blood clots   . History of myocardial infarction 2010; 2019  . History of pleural empyema   . HIV infection ([place])   . Hyperlipidemia   . Hypertension   . Immune deficiency disorder ([place])   . Ischemic cardiomyopathy 04/05/2018  . Moderate persistent  asthma without complication 42/02/5360  . Myocardial infarction ([place])    2009, 2019  . Pneumonia     Patient Active Problem List   Diagnosis Date Noted  . Chest pain 09/30/2020  . Hemorrhoid 09/30/2020  . Avascular necrosis of bone of right hip ([place]) 09/25/2019  . 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 Hyperlipidemia 04/05/2018  . Ischemic cardiomyopathy 04/05/2018  . History of acute inferior wall MI 03/24/2018  . Marijuana abuse, continuous 10/29/2016  . Moderate persistent asthma without complication 44/31/5400  . Allergic rhinitis 10/08/2016  . Anemia 05/03/2015  . Lung abscess ([place]) 04/22/2015  . Bullous emphysema ([place])   . Lobar pneumonia ([place])   . Empyema lung ([place]) 04/16/2015  . HIV (human immunodeficiency virus infection) ([place])   . Tobacco abuse 03/21/2015  . Lung mass 08/31/2014  . 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 Myalgia 04/23/2014  . AIDS ([place]) 03/22/2014  . CAP (community acquired pneumonia) 03/22/2014  . CAD (coronary artery disease) 11/13/2011    Past Surgical History:  Procedure Laterality Date  . CARDIAC CATHETERIZATION  06/24/2009   EF 60%  . CARDIAC CATHETERIZATION  06/17/2009   EF 45%. ANTERIOR HYPOKINESIS  . CORONARY STENT INTERVENTION N/A 03/24/2018   Procedure: CORONARY STENT INTERVENTION;  Surgeon: Sherren Mocha, MD;  Location: [facility];  Service: Cardiovascular;  Laterality: N/A;  . CORONARY/GRAFT ACUTE MI REVASCULARIZATION N/A 03/24/2018   Procedure:  Coronary/Graft Acute MI Revascularization;  Surgeon: Sherren Mocha, MD;  Location: [facility];  Service: Cardiovascular;  Laterality: N/A;  . LEFT HEART CATH AND CORONARY ANGIOGRAPHY N/A 03/24/2018   Procedure: LEFT HEART CATH AND CORONARY ANGIOGRAPHY;  Surgeon: Sherren Mocha, MD;  Location: [facility];  Service: Cardiovascular;  Laterality: N/A;  . TOTAL HIP ARTHROPLASTY Right 09/25/2019   Procedure: RIGHT TOTAL HIP ARTHROPLASTY ANTERIOR APPROACH;   Surgeon: Leandrew Koyanagi, MD;  Location: [facility];  Service: Orthopedics;  Laterality: Right;  . US ECHOCARDIOGRAPHY  12/09/2009   EF 55-60%  . VIDEO ASSISTED THORACOSCOPY (VATS)/ LOBECTOMY Right 04/23/2015   Procedure: VIDEO ASSISTED THORACOSCOPY (VATS)/RIGHT upper  and right middle LOBECTOMY;  Surgeon: Ivin Poot, MD;  Location: [facility];  Service: Thoracic;  Laterality: Right;  Marland Kitchen VIDEO BRONCHOSCOPY Bilateral 09/05/2014   Procedure: VIDEO BRONCHOSCOPY WITH FLUORO;  Surgeon: Wilhelmina Mcardle, MD;  Location: [facility];  Service: Cardiopulmonary;  Laterality: Bilateral;  . VIDEO BRONCHOSCOPY Bilateral 04/10/2015   Procedure: VIDEO BRONCHOSCOPY WITH FLUORO;  Surgeon: Juanito Doom, MD;  Location: [facility];  Service: Cardiopulmonary;  Laterality: Bilateral;       Home Medications    Prior to Admission medications   Medication Sig Start Date End Date Taking? Authorizing Provider  albuterol (VENTOLIN HFA) 108 (90 Base) MCG/ACT inhaler Inhale 2 puffs into the lungs every 6 (six) hours as needed for wheezing or shortness of breath. 09/19/20  Yes [place] Callas, NP  aspirin EC 81 MG tablet Take 1 tablet (81 mg total) by mouth 2 (two) times daily. 09/25/19  Yes Leandrew Koyanagi, MD  bictegravir-emtricitabine-tenofovir AF (BIKTARVY) 50-200-25 MG TABS tablet Take 1 tablet by mouth daily. 02/12/21  Yes Carlyle Basques, MD  cetirizine (ZYRTEC) 10 MG tablet TAKE 1 TABLET(10 MG) BY MOUTH DAILY 05/30/21  Yes Carlyle Basques, MD  darunavir-cobicistat (PREZCOBIX) 800-150 MG tablet TAKE 1 TABLET BY MOUTH DAILY WITH BREAKFAST. SWALLOW WHOLE. DO NOT CRUSH, BREAK OR CHEW TABLETS.TAKE WITH FOOD 02/12/21  Yes Carlyle Basques, MD  doxycycline (VIBRAMYCIN) 100 MG capsule Take 1 capsule (100 mg total) by mouth 2 (two) times daily. 05/30/21  Yes Isaly Fasching K, PA-C  metoprolol succinate (TOPROL-XL) 25 MG 24 hr tablet TAKE 1 TABLET(25 MG) BY MOUTH DAILY. 11/08/20  Yes [name], Wonda Cheng, MD  nitroGLYCERIN (NITROSTAT) 0.4 MG  SL tablet Place 1 tablet (0.4 mg total) under the tongue every 5 (five) minutes x 3 doses as needed for chest pain. 06/27/20  Yes [name], Wonda Cheng, MD  rosuvastatin (CRESTOR) 10 MG tablet Take 1 tablet (10 mg total) by mouth daily. 10/02/20  Yes Sagardia, Ines Bloomer, MD  Cholecalciferol (VITAMIN D3) 125 MCG (5000 UT) CAPS Take 1 capsule (5,000 Units total) by mouth daily. 09/24/20   [place] Callas, NP  prasugrel (EFFIENT) 10 MG TABS tablet Take 1 tablet (10 mg total) by mouth daily. 08/07/20   [name], Wonda Cheng, MD  Sodium Sulfate-Mag Sulfate-KCl (SUTAB) [phone] MG TABS Take 1 kit by mouth as directed. MANUFACTURER CODES!! BIN: [id number] PCN: CN GROUP: [id number] MEMBER ID: [id number];[employer] AS SECONDARY INSURANCE ;NO PRIOR AUTHORIZATION 12/04/20   Willia Craze, NP    Family History Family History  Problem Relation Age of Onset  . Diabetes Mother   . Eczema Mother   . Asthma Mother   . COPD Mother   . Hypertension Mother   . Hyperlipidemia Mother   . Stroke Mother   . Heart disease Mother   .  Sleep apnea Mother   . Liver cancer Father   . Asthma Brother   . Diabetes Maternal Grandmother   . Heart failure Maternal Grandmother     Social History Social History   Tobacco Use  . Smoking status: Former Smoker    Packs/day: 0.25    Years: 17.00    Pack years: 4.25    Types: Cigarettes, Cigars    Quit date: 03/23/2015    Years since quitting: 6.1  . Smokeless tobacco: Never Used  Vaping Use  . Vaping Use: Never used  Substance Use Topics  . Alcohol use: No    Alcohol/week: 0.0 standard drinks  . Drug use: Not Currently    Frequency: 7.0 times per week    Types: Marijuana     Allergies   Patient has no known allergies.   Review of Systems Review of Systems  Constitutional: Positive for activity change. Negative for appetite change, fatigue and fever.  HENT: Positive for congestion, postnasal drip and sinus pressure. Negative for sneezing and sore throat.    Respiratory: Positive for cough. Negative for shortness of breath.   Cardiovascular: Negative for chest pain.  Gastrointestinal: Negative for abdominal pain, diarrhea, nausea and vomiting.  Musculoskeletal: Negative for arthralgias and myalgias.  Neurological: Positive for headaches. Negative for dizziness and light-headedness.     Physical Exam Triage Vital Signs ED Triage Vitals  Enc Vitals Group     BP 05/30/21 1440 107/63     Pulse Rate 05/30/21 1440 67     Resp 05/30/21 1440 18     Temp 05/30/21 1440 98.3 F (36.8 C)     Temp src --      SpO2 05/30/21 1440 99 %     Weight --      Height --      Head Circumference --      Peak Flow --      Pain Score 05/30/21 1437 0     Pain Loc --      Pain Edu? --      Excl. in [place]? --    No data found.  Updated Vital Signs BP 107/63   Pulse 67   Temp 98.3 F (36.8 C)   Resp 18   SpO2 99%   Visual Acuity Right Eye Distance:   Left Eye Distance:   Bilateral Distance:    Right Eye Near:   Left Eye Near:    Bilateral Near:     Physical Exam Vitals reviewed.  Constitutional:      General: He is awake.     Appearance: Normal appearance. He is normal weight. He is not ill-appearing.     Comments: Very pleasant male appears stated age in no acute distress  HENT:     Head: Normocephalic and atraumatic.     Right Ear: Tympanic membrane, ear canal and external ear normal. Tympanic membrane is not erythematous or bulging.     Left Ear: Tympanic membrane, ear canal and external ear normal. Tympanic membrane is not erythematous or bulging.     Nose:     Right Sinus: Maxillary sinus tenderness present. No frontal sinus tenderness.     Left Sinus: Maxillary sinus tenderness present. No frontal sinus tenderness.     Mouth/Throat:     Pharynx: Uvula midline. No oropharyngeal exudate or posterior oropharyngeal erythema.  Cardiovascular:     Rate and Rhythm: Normal rate and regular rhythm.     Heart sounds: Normal heart sounds. No  murmur heard.  Pulmonary:     Effort: Pulmonary effort is normal. No accessory muscle usage or respiratory distress.     Breath sounds: Normal breath sounds. No stridor. No wheezing, rhonchi or rales.     Comments: Clear to auscultation bilaterally Abdominal:     General: Bowel sounds are normal.     Palpations: Abdomen is soft.     Tenderness: There is no abdominal tenderness.  Lymphadenopathy:     Head:     Right side of head: No submental, submandibular or tonsillar adenopathy.     Left side of head: No submental, submandibular or tonsillar adenopathy.     Cervical: No cervical adenopathy.  Neurological:     Mental Status: He is alert.  Psychiatric:        Behavior: Behavior is cooperative.      UC Treatments / Results  Labs (all labs ordered are listed, but only abnormal results are displayed) Labs Reviewed  SARS CORONAVIRUS 2 (TAT 6-24 HRS)    EKG   Radiology No results found.  Procedures Procedures (including critical care time)  Medications Ordered in UC Medications - No data to display  Initial Impression / Assessment and Plan / UC Course  I have reviewed the triage vital signs and the nursing notes.  Pertinent labs & imaging results that were available during my care of the patient were reviewed by me and considered in my medical decision making (see chart for details).     Patient started on doxycycline given prolonged and worsening symptoms.  Discussed that we do not need to test for COVID-19 patient did request this given employer's requirement and so this was completed today.  Discussed that negative test does not rule out symptoms initially began as COVID-19 but does not change management at this time.  Recommended that he use over-the-counter medications for additional symptom relief.  Discussed the importance of drinking plenty of fluid.  Discussed alarm symptoms that warrant emergent evaluation.  Strict return precautions given to which patient  expressed understanding.  Final Clinical Impressions(s) / UC Diagnoses   Final diagnoses:  Sinobronchitis  Cough     Discharge Instructions     We will be in touch if your COVID results are positive.  Started doxycycline to cover for sinuses and bronchitis given you have had 2 weeks of symptoms.  Stay out of the sun while on this medication.  Can upset your stomach so take it with food.  Make sure you are drinking plenty of fluid.  If anything worsens please return for reevaluation.  Follow-up with your primary care provider within 1 week to ensure symptom improvement.    ED Prescriptions    Medication Sig Dispense Auth. Provider   doxycycline (VIBRAMYCIN) 100 MG capsule Take 1 capsule (100 mg total) by mouth 2 (two) times daily. 20 capsule Mervin Ramires, Derry Skill, PA-C     PDMP not reviewed this encounter.   Terrilee Croak, PA-C 05/30/21 1457

## 2021-05-30 NOTE — Discharge Instructions (Signed)
We will be in touch if your COVID results are positive.  Started doxycycline to cover for sinuses and bronchitis given you have had 2 weeks of symptoms.  Stay out of the sun while on this medication.  Can upset your stomach so take it with food.  Make sure you are drinking plenty of fluid.  If anything worsens please return for reevaluation.  Follow-up with your primary care provider within 1 week to ensure symptom improvement.

## 2021-05-31 LAB — SARS CORONAVIRUS 2 (TAT 6-24 HRS): SARS Coronavirus 2: NEGATIVE

## 2021-07-28 ENCOUNTER — Other Ambulatory Visit: Payer: Self-pay | Admitting: Cardiovascular Disease

## 2021-08-12 ENCOUNTER — Other Ambulatory Visit: Payer: Self-pay

## 2021-08-12 ENCOUNTER — Other Ambulatory Visit: Payer: Medicare Other

## 2021-08-12 DIAGNOSIS — B20 Human immunodeficiency virus [HIV] disease: Secondary | ICD-10-CM

## 2021-08-12 DIAGNOSIS — Z79899 Other long term (current) drug therapy: Secondary | ICD-10-CM

## 2021-08-12 DIAGNOSIS — Z113 Encounter for screening for infections with a predominantly sexual mode of transmission: Secondary | ICD-10-CM

## 2021-08-13 LAB — T-HELPER CELL (CD4) - (RCID CLINIC ONLY)
CD4 % Helper T Cell: 27 % — ABNORMAL LOW (ref 33–65)
CD4 T Cell Abs: 1021 /uL (ref 400–1790)

## 2021-08-14 LAB — CBC WITH DIFFERENTIAL/PLATELET
Absolute Monocytes: 545 cells/uL (ref 200–950)
Basophils Absolute: 21 cells/uL (ref 0–200)
Basophils Relative: 0.3 %
Eosinophils Absolute: 69 cells/uL (ref 15–500)
Eosinophils Relative: 1 %
HCT: 41.3 % (ref 38.5–50.0)
Hemoglobin: 14 g/dL (ref 13.2–17.1)
Lymphs Abs: 3926 cells/uL — ABNORMAL HIGH (ref 850–3900)
MCH: 32.5 pg (ref 27.0–33.0)
MCHC: 33.9 g/dL (ref 32.0–36.0)
MCV: 95.8 fL (ref 80.0–100.0)
MPV: 10.6 fL (ref 7.5–12.5)
Monocytes Relative: 7.9 %
Neutro Abs: 2339 cells/uL (ref 1500–7800)
Neutrophils Relative %: 33.9 %
Platelets: 176 10*3/uL (ref 140–400)
RBC: 4.31 10*6/uL (ref 4.20–5.80)
RDW: 13.3 % (ref 11.0–15.0)
Total Lymphocyte: 56.9 %
WBC: 6.9 10*3/uL (ref 3.8–10.8)

## 2021-08-14 LAB — LIPID PANEL
Cholesterol: 144 mg/dL (ref <200)
HDL: 44 mg/dL (ref >=40)
LDL Cholesterol (Calc): 83 mg/dL (calc)
Non-HDL Cholesterol (Calc): 100 mg/dL (calc) (ref <130)
Total CHOL/HDL Ratio: 3.3 (calc) (ref <5.0)
Triglycerides: 80 mg/dL (ref <150)

## 2021-08-14 LAB — COMPLETE METABOLIC PANEL WITH GFR
AG Ratio: 1.3 (calc) (ref 1.0–2.5)
ALT: 23 U/L (ref 9–46)
AST: 25 U/L (ref 10–40)
Albumin: 4 g/dL (ref 3.6–5.1)
Alkaline phosphatase (APISO): 72 U/L (ref 36–130)
BUN: 15 mg/dL (ref 7–25)
CO2: 25 mmol/L (ref 20–32)
Calcium: 8.8 mg/dL (ref 8.6–10.3)
Chloride: 103 mmol/L (ref 98–110)
Creat: 1.21 mg/dL (ref 0.60–1.29)
Globulin: 3.2 g/dL (calc) (ref 1.9–3.7)
Glucose, Bld: 95 mg/dL (ref 65–99)
Potassium: 3.6 mmol/L (ref 3.5–5.3)
Sodium: 136 mmol/L (ref 135–146)
Total Bilirubin: 0.6 mg/dL (ref 0.2–1.2)
Total Protein: 7.2 g/dL (ref 6.1–8.1)
eGFR: 78 mL/min/{1.73_m2} (ref >=60)

## 2021-08-14 LAB — HIV-1 RNA QUANT-NO REFLEX-BLD
HIV 1 RNA Quant: 78 Copies/mL — ABNORMAL HIGH
HIV-1 RNA Quant, Log: 1.89 Log cps/mL — ABNORMAL HIGH

## 2021-08-14 LAB — RPR TITER: RPR Titer: 1:1 {titer} — ABNORMAL HIGH

## 2021-08-14 LAB — RPR: RPR Ser Ql: REACTIVE — AB

## 2021-08-14 LAB — FLUORESCENT TREPONEMAL AB(FTA)-IGG-BLD: Fluorescent Treponemal ABS: REACTIVE — AB

## 2021-08-26 ENCOUNTER — Ambulatory Visit (INDEPENDENT_AMBULATORY_CARE_PROVIDER_SITE_OTHER): Payer: Medicare Other | Admitting: Internal Medicine

## 2021-08-26 ENCOUNTER — Encounter: Payer: Self-pay | Admitting: Internal Medicine

## 2021-08-26 ENCOUNTER — Other Ambulatory Visit: Payer: Self-pay

## 2021-08-26 VITALS — Wt 132.0 lb

## 2021-08-26 DIAGNOSIS — J301 Allergic rhinitis due to pollen: Secondary | ICD-10-CM | POA: Diagnosis not present

## 2021-08-26 DIAGNOSIS — B20 Human immunodeficiency virus [HIV] disease: Secondary | ICD-10-CM | POA: Diagnosis not present

## 2021-08-26 DIAGNOSIS — Z79899 Other long term (current) drug therapy: Secondary | ICD-10-CM | POA: Diagnosis not present

## 2021-08-26 MED ORDER — BECONASE AQ 42 MCG/SPRAY NA SUSP: 1.0000 | Freq: Two times a day (BID) | NASAL | 12 refills | Status: DC

## 2021-08-26 NOTE — Progress Notes (Signed)
RFV: follow up for hiv disease Patient ID: Rodney Walker, male   DOB: [birth year], 40 y.o.   MRN: [id number]  HPI 40yo M with hx of well controlled hiv disease, bronchiectasis, CD 4 count 1021/VL 78 on biktarvy-cDRV - has been missing doses, 3 doses per month. Takes meds at bedtime, sometimes misses it. Has ongoing nasal congestion. Seasonal allergies No recent sti. Works roughly 37-40hrs -per week  Outpatient Encounter Medications as of 08/26/2021  Medication Sig   prasugrel (EFFIENT) 10 MG TABS tablet TAKE 1 TABLET(10 MG) BY MOUTH DAILY   albuterol (VENTOLIN HFA) 108 (90 Base) MCG/ACT inhaler Inhale 2 puffs into the lungs every 6 (six) hours as needed for wheezing or shortness of breath.   aspirin EC 81 MG tablet Take 1 tablet (81 mg total) by mouth 2 (two) times daily.   bictegravir-emtricitabine-tenofovir AF (BIKTARVY) 50-200-25 MG TABS tablet Take 1 tablet by mouth daily.   cetirizine (ZYRTEC) 10 MG tablet TAKE 1 TABLET(10 MG) BY MOUTH DAILY   Cholecalciferol (VITAMIN D3) 125 MCG (5000 UT) CAPS Take 1 capsule (5,000 Units total) by mouth daily.   darunavir-cobicistat (PREZCOBIX) 800-150 MG tablet TAKE 1 TABLET BY MOUTH DAILY WITH BREAKFAST. SWALLOW WHOLE. DO NOT CRUSH, BREAK OR CHEW TABLETS.TAKE WITH FOOD   doxycycline (VIBRAMYCIN) 100 MG capsule Take 1 capsule (100 mg total) by mouth 2 (two) times daily.   metoprolol succinate (TOPROL-XL) 25 MG 24 hr tablet TAKE 1 TABLET(25 MG) BY MOUTH DAILY.   nitroGLYCERIN (NITROSTAT) 0.4 MG SL tablet Place 1 tablet (0.4 mg total) under the tongue every 5 (five) minutes x 3 doses as needed for chest pain.   rosuvastatin (CRESTOR) 10 MG tablet Take 1 tablet (10 mg total) by mouth daily.   Sodium Sulfate-Mag Sulfate-KCl (SUTAB) [phone] MG TABS Take 1 kit by mouth as directed. MANUFACTURER CODES!! BIN: [id number] PCN: CN GROUP: [id number] MEMBER ID: [id number];[employer] AS SECONDARY INSURANCE ;NO PRIOR AUTHORIZATION   No facility-administered  encounter medications on file as of 08/26/2021.     Patient Active Problem List   Diagnosis Date Noted   Chest pain 09/30/2020   Hemorrhoid 09/30/2020   Avascular necrosis of bone of right hip ([place]) 09/25/2019   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   Hyperlipidemia 04/05/2018   Ischemic cardiomyopathy 04/05/2018   History of acute inferior wall MI 03/24/2018   Marijuana abuse, continuous 10/29/2016   Moderate persistent asthma without complication 23/53/6144   Allergic rhinitis 10/08/2016   Anemia 05/03/2015   Lung abscess ([place]) 04/22/2015   Bullous emphysema ([facility])    Lobar pneumonia ([place])    Empyema lung ([place]) 04/16/2015   HIV (human immunodeficiency virus infection) ([place])    Tobacco abuse 03/21/2015   Lung mass 31/54/0086   Eosinophilic folliculitis 76/19/5093   Myalgia 04/23/2014   AIDS ([place]) 03/22/2014   CAP (community acquired pneumonia) 03/22/2014   CAD (coronary artery disease) 11/13/2011     Health Maintenance Due  Topic Date Due   Pneumococcal Vaccine 66-33 Years old (3 - PPSV23 or PCV20) 09/27/2019   COVID-19 Vaccine (4 - Booster for Pfizer series) 02/08/2021   INFLUENZA VACCINE  07/28/2021     Review of Systems  Constitutional: Negative for fever, chills, diaphoresis, activity change, appetite change, fatigue and unexpected weight change.  HENT: Negative for congestion, sore throat, rhinorrhea, sneezing, trouble swallowing and sinus pressure.  Eyes: Negative for photophobia and visual disturbance.  Respiratory: Negative for cough, chest tightness, shortness of breath, wheezing and stridor.  Cardiovascular:  Negative for chest pain, palpitations and leg swelling.  Gastrointestinal: Negative for nausea, vomiting, abdominal pain, diarrhea, constipation, blood in stool, abdominal distention and anal bleeding.  Genitourinary: Negative for dysuria, hematuria, flank pain and difficulty urinating.  Musculoskeletal: Negative for myalgias, back pain,  joint swelling, arthralgias and gait problem.  Skin: Negative for color change, pallor, rash and wound.  Neurological: Negative for dizziness, tremors, weakness and light-headedness.  Hematological: Negative for adenopathy. Does not bruise/bleed easily.  Psychiatric/Behavioral: Negative for behavioral problems, confusion, sleep disturbance, dysphoric mood, decreased concentration and agitation.   Physical Exam   Wt 132 lb (59.9 kg)   BMI 22.31 kg/m   Physical Exam  Constitutional: He is oriented to person, place, and time. He appears well-developed and well-nourished. No distress.  HENT:  Mouth/Throat: Oropharynx is clear and moist. No oropharyngeal exudate.  Cardiovascular: Normal rate, regular rhythm and normal heart sounds. Exam reveals no gallop and no friction rub.  No murmur heard.  Pulmonary/Chest: Effort normal and breath sounds normal. No respiratory distress. He has no wheezes.  Abdominal: Soft. Bowel sounds are normal. He exhibits no distension. There is no tenderness.  Lymphadenopathy:  He has no cervical adenopathy.  Neurological: He is alert and oriented to person, place, and time.  Skin: Skin is warm and dry. No rash noted. No erythema.  Psychiatric: He has a normal mood and affect. His behavior is normal.   Lab Results  Component Value Date   CD4TCELL 27 (L) 08/12/2021   Lab Results  Component Value Date   CD4TABS 1,021 08/12/2021   CD4TABS 685 12/23/2020   CD4TABS 621 10/23/2020   Lab Results  Component Value Date   HIV1RNAQUANT 78 (H) 08/12/2021   No results found for: HEPBSAB Lab Results  Component Value Date   LABRPR REACTIVE (A) 08/12/2021    CBC Lab Results  Component Value Date   WBC 6.9 08/12/2021   RBC 4.31 08/12/2021   HGB 14.0 08/12/2021   HCT 41.3 08/12/2021   PLT 176 08/12/2021   MCV 95.8 08/12/2021   MCH 32.5 08/12/2021   MCHC 33.9 08/12/2021   RDW 13.3 08/12/2021   LYMPHSABS 3,926 (H) 08/12/2021   MONOABS 0.7 09/19/2019    EOSABS 69 08/12/2021    BMET Lab Results  Component Value Date   NA 136 08/12/2021   K 3.6 08/12/2021   CL 103 08/12/2021   CO2 25 08/12/2021   GLUCOSE 95 08/12/2021   BUN 15 08/12/2021   CREATININE 1.21 08/12/2021   CALCIUM 8.8 08/12/2021   GFRNONAA 84 12/23/2020   GFRAA 97 12/23/2020      Assessment and Plan  HIV disease = slightly better adherence is needed. Continue with biktarvy-cDRV  Long term medication management = cr stable  Allergic rhnitis = continue zyrtec, and beconase AQ nasal spray  Health maintenance = needs covid vaccine and monkeypox

## 2021-08-27 ENCOUNTER — Other Ambulatory Visit: Payer: Self-pay

## 2021-08-27 ENCOUNTER — Ambulatory Visit (INDEPENDENT_AMBULATORY_CARE_PROVIDER_SITE_OTHER): Payer: Medicare Other

## 2021-08-27 DIAGNOSIS — Z23 Encounter for immunization: Secondary | ICD-10-CM | POA: Diagnosis not present

## 2021-08-27 NOTE — Progress Notes (Signed)
   Covid-19 Vaccination Clinic  Name:  Marce Charlesworth    MRN: [id number] DOB: [birth year]  08/27/2021  Mr. Mckenney was observed post Covid-19 immunization for 15 minutes without incident. He was provided with Vaccine Information Sheet and instruction to access the V-Safe system.   Mr. Montesinos was instructed to call 911 with any severe reactions post vaccine: Difficulty breathing  Swelling of face and throat  A fast heartbeat  A bad rash all over body  Dizziness and weakness   Immunizations Administered     Name Date Dose VIS Date Route   PFIZER Comrnaty(Gray TOP) Covid-19 Vaccine 08/27/2021  4:34 PM 0.3 mL 12/05/2020 Intramuscular   Manufacturer: [employer], [employer]   Lot: [id number]   NDC: [address]-8      Rubi Tooley T Pricilla Loveless

## 2021-09-11 ENCOUNTER — Other Ambulatory Visit: Payer: Self-pay | Admitting: Internal Medicine

## 2021-09-11 MED ORDER — FLUNISOLIDE 25 MCG/ACT (0.025%) NA SOLN: 2.0000 | Freq: Two times a day (BID) | NASAL | 5 refills | Status: DC

## 2021-09-11 NOTE — Progress Notes (Signed)
Beconase require a PA so substituted with Flunisolide nasal spray.

## 2021-09-12 ENCOUNTER — Other Ambulatory Visit: Payer: Self-pay

## 2021-09-12 ENCOUNTER — Ambulatory Visit (INDEPENDENT_AMBULATORY_CARE_PROVIDER_SITE_OTHER): Payer: Medicare Other

## 2021-09-12 VITALS — BP 102/60 | HR 75 | Temp 98.4°F | Ht 65.0 in | Wt 133.6 lb

## 2021-09-12 DIAGNOSIS — Z0000 Encounter for general adult medical examination without abnormal findings: Secondary | ICD-10-CM

## 2021-09-12 NOTE — Progress Notes (Addendum)
Subjective:   Elian Chadric Kimberley is a 40 y.o. male who presents for an Initial Medicare Annual Wellness Visit.  Review of Systems     Cardiac Risk Factors include: dyslipidemia;family history of premature cardiovascular disease;hypertension;male gender;smoking/ tobacco exposure     Objective:    Today's Vitals   09/12/21 1556  BP: 102/60  Pulse: 75  Temp: 98.4 F (36.9 C)  SpO2: 94%  Weight: 133 lb 9.6 oz (60.6 kg)  Height: 5' 5"  (1.651 m)  PainSc: 0-No pain   Body mass index is 22.23 kg/m.  Advanced Directives 09/12/2021 09/25/2019 09/25/2019 09/19/2019 04/27/2018 03/24/2018 09/25/2017  Does Patient Have a Medical Advance Directive? No - No No No No No  Would patient like information on creating a medical advance directive? No - Patient declined No - Patient declined - No - Patient declined Yes (MAU/Ambulatory/Procedural Areas - Information given) No - Patient declined -    Current Medications (verified) Outpatient Encounter Medications as of 09/12/2021  Medication Sig   prasugrel (EFFIENT) 10 MG TABS tablet TAKE 1 TABLET(10 MG) BY MOUTH DAILY   albuterol (VENTOLIN HFA) 108 (90 Base) MCG/ACT inhaler Inhale 2 puffs into the lungs every 6 (six) hours as needed for wheezing or shortness of breath.   aspirin EC 81 MG tablet Take 1 tablet (81 mg total) by mouth 2 (two) times daily.   bictegravir-emtricitabine-tenofovir AF (BIKTARVY) 50-200-25 MG TABS tablet Take 1 tablet by mouth daily.   cetirizine (ZYRTEC) 10 MG tablet TAKE 1 TABLET(10 MG) BY MOUTH DAILY   Cholecalciferol (VITAMIN D3) 125 MCG (5000 UT) CAPS Take 1 capsule (5,000 Units total) by mouth daily.   darunavir-cobicistat (PREZCOBIX) 800-150 MG tablet TAKE 1 TABLET BY MOUTH DAILY WITH BREAKFAST. SWALLOW WHOLE. DO NOT CRUSH, BREAK OR CHEW TABLETS.TAKE WITH FOOD   flunisolide (NASALIDE) 25 MCG/ACT (0.025%) SOLN Place 2 sprays into the nose 2 (two) times daily. (Patient not taking: Reported on 09/12/2021)   metoprolol  succinate (TOPROL-XL) 25 MG 24 hr tablet TAKE 1 TABLET(25 MG) BY MOUTH DAILY.   nitroGLYCERIN (NITROSTAT) 0.4 MG SL tablet Place 1 tablet (0.4 mg total) under the tongue every 5 (five) minutes x 3 doses as needed for chest pain.   rosuvastatin (CRESTOR) 10 MG tablet Take 1 tablet (10 mg total) by mouth daily.   Sodium Sulfate-Mag Sulfate-KCl (SUTAB) [phone] MG TABS Take 1 kit by mouth as directed. MANUFACTURER CODES!! BIN: [id number] PCN: CN GROUP: [id number] MEMBER ID: [id number];[employer] AS SECONDARY INSURANCE ;NO PRIOR AUTHORIZATION   No facility-administered encounter medications on file as of 09/12/2021.    Allergies (verified) Patient has no known allergies.   History: Past Medical History:  Diagnosis Date   Anemia    COPD (chronic obstructive pulmonary disease) ([place])    Coronary artery disease    MI 2010 - dissection/plaque rupture in LAD tx with antiplt/antithrombotics >> relook cath with resolution (no PCI performed) // Inf STEMI 3/19: OM1 30; pRCA 100 >> DES // Echo 3/19:  mild LVH, EF 40-45, inf/inf-sept HK, trivial MR   Eczema    History of blood clots    History of myocardial infarction 2010; 2019   History of pleural empyema    HIV infection ([place])    Hyperlipidemia    Hypertension    Immune deficiency disorder ([place])    Ischemic cardiomyopathy 04/05/2018   Moderate persistent asthma without complication 99/02/7168   Myocardial infarction [facility])    2009, 2019   Pneumonia    Past Surgical History:  Procedure  Laterality Date   CARDIAC CATHETERIZATION  06/24/2009   EF 60%   CARDIAC CATHETERIZATION  06/17/2009   EF 45%. ANTERIOR HYPOKINESIS   CORONARY STENT INTERVENTION N/A 03/24/2018   Procedure: CORONARY STENT INTERVENTION;  Surgeon: Sherren Mocha, MD;  Location: [place] [facility];  Service: Cardiovascular;  Laterality: N/A;   CORONARY/GRAFT ACUTE MI REVASCULARIZATION N/A 03/24/2018   Procedure: Coronary/Graft Acute MI Revascularization;  Surgeon: Sherren Mocha, MD;   Location: [place] [facility];  Service: Cardiovascular;  Laterality: N/A;   LEFT HEART CATH AND CORONARY ANGIOGRAPHY N/A 03/24/2018   Procedure: LEFT HEART CATH AND CORONARY ANGIOGRAPHY;  Surgeon: Sherren Mocha, MD;  Location: [place] [facility];  Service: Cardiovascular;  Laterality: N/A;   TOTAL HIP ARTHROPLASTY Right 09/25/2019   Procedure: RIGHT TOTAL HIP ARTHROPLASTY ANTERIOR APPROACH;  Surgeon: Leandrew Koyanagi, MD;  Location: [facility];  Service: Orthopedics;  Laterality: Right;   US ECHOCARDIOGRAPHY  12/09/2009   EF 55-60%   VIDEO ASSISTED THORACOSCOPY (VATS)/ LOBECTOMY Right 04/23/2015   Procedure: VIDEO ASSISTED THORACOSCOPY (VATS)/RIGHT upper  and right middle LOBECTOMY;  Surgeon: Ivin Poot, MD;  Location: [place];  Service: Thoracic;  Laterality: Right;   VIDEO BRONCHOSCOPY Bilateral 09/05/2014   Procedure: VIDEO BRONCHOSCOPY WITH FLUORO;  Surgeon: Wilhelmina Mcardle, MD;  Location: [facility] ENDOSCOPY;  Service: Cardiopulmonary;  Laterality: Bilateral;   VIDEO BRONCHOSCOPY Bilateral 04/10/2015   Procedure: VIDEO BRONCHOSCOPY WITH FLUORO;  Surgeon: Juanito Doom, MD;  Location: [facility] ENDOSCOPY;  Service: Cardiopulmonary;  Laterality: Bilateral;   Family History  Problem Relation Age of Onset   Diabetes Mother    Eczema Mother    Asthma Mother    COPD Mother    Hypertension Mother    Hyperlipidemia Mother    Stroke Mother    Heart disease Mother    Sleep apnea Mother    Liver cancer Father    Asthma Brother    Diabetes Maternal Grandmother    Heart failure Maternal Grandmother    Social History   Socioeconomic History   Marital status: Single    Spouse name: Not on file   Number of children: 0   Years of education: Not on file   Highest education level: Not on file  Occupational History   Occupation: unemployed  Tobacco Use   Smoking status: Former    Packs/day: 0.25    Years: 17.00    Pack years: 4.25    Types: Cigarettes, Cigars    Quit date: 03/23/2015    Years since  quitting: 6.4   Smokeless tobacco: Never  Vaping Use   Vaping Use: Never used  Substance and Sexual Activity   Alcohol use: No    Alcohol/week: 0.0 standard drinks   Drug use: Not Currently    Frequency: 7.0 times per week    Types: Marijuana   Sexual activity: Not Currently    Partners: Male    Comment: given condoms 09/19/20   Other Topics Concern   Not on file  Social History Narrative   Not on file   Social Determinants of Health   Financial Resource Strain: Low Risk    Difficulty of Paying Living Expenses: Not hard at all  Food Insecurity: No Food Insecurity   Worried About Charity fundraiser in the Last Year: Never true   [place] in the Last Year: Never true  Transportation Needs: No Transportation Needs   Lack of Transportation (Medical): No   Lack of Transportation (Non-Medical): No  Physical Activity: Sufficiently Active   Days of Exercise per Week: 5 days   Minutes of Exercise per Session: 30 min  Stress: Stress Concern Present   Feeling of Stress : To some extent  Social Connections: Moderately Integrated   Frequency of Communication with Friends and Family: More than three times a week   Frequency of Social Gatherings with Friends and Family: More than three times a week   Attends Religious Services: 1 to 4 times per year   Active Member of [employer] or Organizations: No   Attends Music therapist: 1 to 4 times per year   Marital Status: Never married    Tobacco Counseling Counseling given: Not Answered   Clinical Intake:  Pre-visit preparation completed: Yes  Pain : No/denies pain Pain Score: 0-No pain     BMI - recorded: 22.23 Nutritional Status: BMI of 19-24  Normal Nutritional Risks: None Diabetes: No  How often do you need to have someone help you when you read instructions, pamphlets, or other written materials from your doctor or pharmacy?: 1 - Never What is the last grade level you completed in school?: 4 years of  college at [employer]  Diabetic? no  Interpreter Needed?: No  Information entered by :: Lisette Abu, LPN   Activities of Daily Living In your present state of health, do you have any difficulty performing the following activities: 09/12/2021  Hearing? N  Vision? Y  Difficulty concentrating or making decisions? N  Walking or climbing stairs? N  Dressing or bathing? N  Doing errands, shopping? N  Preparing Food and eating ? N  Using the Toilet? N  In the past six months, have you accidently leaked urine? N  Do you have problems with loss of bowel control? N  Managing your Medications? N  Managing your Finances? N  Housekeeping or managing your Housekeeping? N  Some recent data might be hidden    Patient Care Team: Horald Pollen, MD as PCP - General (Internal Medicine) [name], Wonda Cheng, MD as PCP - Cardiology (Cardiology) Carlyle Basques, MD as Consulting Physician (Infectious Diseases) Carlyle Basques, MD as Consulting Physician (Infectious Diseases)  Indicate any recent Medical Services you may have received from other than [facility] providers in the past year (date may be approximate).     Assessment:   This is a routine wellness examination for [place].  Hearing/Vision screen No results found.  Dietary issues and exercise activities discussed: Current Exercise Habits: Home exercise routine, Type of exercise: walking;treadmill;stretching;strength training/weights, Time (Minutes): 30, Frequency (Times/Week): 5, Weekly Exercise (Minutes/Week): 150, Intensity: Moderate, Exercise limited by: respiratory conditions(s)   Goals Addressed               This Visit's Progress     Patient Stated (pt-stated)        My goal is to tone up more.      Depression Screen PHQ 2/9 Scores 09/12/2021 10/23/2020 10/02/2020 09/19/2020 07/03/2020 01/03/2020 10/02/2019  PHQ - 2 Score 0 0 0 0 0 0 0  PHQ- 9 Score - - - - - - -    Fall Risk Fall Risk  09/12/2021 10/23/2020  10/02/2020 09/19/2020 07/03/2020  Falls in the past year? 0 0 0 0 0  Number falls in past yr: 0 - 0 - -  Injury with Fall? 0 - 0 - -  Risk for fall due to : No Fall Risks No Fall Risks - No Fall Risks -  Risk for fall due  to: Comment - - - - -  Follow up Falls evaluation completed Falls evaluation completed Falls evaluation completed Falls evaluation completed -    FALL RISK PREVENTION PERTAINING TO THE HOME:  Any stairs in or around the home? No  If so, are there any without handrails? No  Home free of loose throw rugs in walkways, pet beds, electrical cords, etc? Yes  Adequate lighting in your home to reduce risk of falls? Yes   ASSISTIVE DEVICES UTILIZED TO PREVENT FALLS:  Life alert? No  Use of a cane, walker or w/c? No  Grab bars in the bathroom? No  Shower chair or bench in shower? No  Elevated toilet seat or a handicapped toilet? No   TIMED UP AND GO:  Was the test performed? Yes .  Length of time to ambulate 10 feet: 6 sec.   Gait steady and fast without use of assistive device  Cognitive Function: Normal cognitive status assessed by direct observation by this Nurse Health Advisor. No abnormalities found.          Immunizations Immunization History  Administered Date(s) Administered   HPV 9-valent 08/08/2018, 09/05/2018, 02/03/2019   Hepatitis A, Adult 09/26/2014, 03/14/2015   Hepatitis B, adult 09/26/2014, 01/23/2015, 03/14/2015   Influenza,inj,Quad PF,6+ Mos 09/26/2014, 10/16/2015, 10/08/2016, 10/06/2017, 11/07/2018, 10/02/2019, 09/19/2020   Meningococcal Mcv4o 08/08/2018, 11/07/2018   PFIZER Comirnaty(Gray Top)Covid-19 Tri-Sucrose Vaccine 08/27/2021   PFIZER(Purple Top)SARS-COV-2 Vaccination 04/03/2020, 04/24/2020, 11/08/2020   Pneumococcal Conjugate-13 05/05/2018   Pneumococcal Polysaccharide-23 09/26/2014   Tdap 10/28/2016    TDAP status: Up to date  Flu Vaccine status: Up to date  Pneumococcal vaccine status: Up to date  Covid-19 vaccine status:  Completed vaccines  Qualifies for Shingles Vaccine? No   Zostavax completed No   Shingrix Completed?: No.    Education has been provided regarding the importance of this vaccine. Patient has been advised to call insurance company to determine out of pocket expense if they have not yet received this vaccine. Advised may also receive vaccine at local pharmacy or Health Dept. Verbalized acceptance and understanding.  Screening Tests Health Maintenance  Topic Date Due   INFLUENZA VACCINE  07/28/2021   COVID-19 Vaccine (5 - Booster for Pfizer series) 12/27/2021   TETANUS/TDAP  10/28/2026   Hepatitis C Screening  Completed   HIV Screening  Completed   HPV VACCINES  Aged Out    Health Maintenance  Health Maintenance Due  Topic Date Due   INFLUENZA VACCINE  07/28/2021    Colorectal screening: due at age 39  Lung Cancer Screening: (Low Dose CT Chest recommended if Age 26-80 years, 30 pack-year currently smoking OR have quit w/in 15years.) does not qualify.   Lung Cancer Screening Referral: no  Additional Screening:  Hepatitis C Screening: does qualify; Completed yes  Vision Screening: Recommended annual ophthalmology exams for early detection of glaucoma and other disorders of the eye. Is the patient up to date with their annual eye exam?  No  Who is the provider or what is the name of the office in which the patient attends annual eye exams? Patient denied referral at this time If pt is not established with a provider, would they like to be referred to a provider to establish care? No .   Dental Screening: Recommended annual dental exams for proper oral hygiene  Community Resource Referral / Chronic Care Management: CRR required this visit?  No   CCM required this visit?  No      Plan:  I have personally reviewed and noted the following in the patient's chart:   Medical and social history Use of alcohol, tobacco or illicit drugs  Current medications and supplements  including opioid prescriptions. Patient is not currently taking opioid prescriptions. Functional ability and status Nutritional status Physical activity Advanced directives List of other physicians Hospitalizations, surgeries, and ER visits in previous 12 months Vitals Screenings to include cognitive, depression, and falls Referrals and appointments  In addition, I have reviewed and discussed with patient certain preventive protocols, quality metrics, and best practice recommendations. A written personalized care plan for preventive services as well as general preventive health recommendations were provided to patient.     Sheral Flow, LPN   [id number]/42/7062   Nurse Notes: n/a

## 2021-09-12 NOTE — Patient Instructions (Addendum)
Rodney Walker , Thank you for taking time to come for your Medicare Wellness Visit. I appreciate your ongoing commitment to your health goals. Please review the following plan we discussed and let me know if I can assist you in the future.   Screening recommendations/referrals: Colonoscopy: never done Recommended yearly ophthalmology/optometry visit for glaucoma screening and checkup Recommended yearly dental visit for hygiene and checkup  Vaccinations: Influenza vaccine: 09/19/2020 Pneumococcal vaccine: 09/26/2014, 05/05/2018 Tdap vaccine: 10/28/2016; due every 10 years Shingles vaccine: due at age 48   Covid-19: 04/03/2020, 04/24/2020, 11/08/2020, 08/27/2021  Advanced directives: Advance directive discussed with you today. Even though you declined this today please call our office should you change your mind and we can give you the proper paperwork for you to fill out.  Conditions/risks identified: Yes; Client understands the importance of follow-up with providers by attending scheduled visits and discussed goals to eat healthier, increase physical activity, exercise the brain, socialize more, get enough sleep and make time for laughter.  Next appointment: Please schedule your next Medicare Wellness Visit with your Nurse Health Advisor in 1 year by calling [phone].  Preventive Care 40-64 Years, Male Preventive care refers to lifestyle choices and visits with your health care provider that can promote health and wellness. What does preventive care include? A yearly physical exam. This is also called an annual well check. Dental exams once or twice a year. Routine eye exams. Ask your health care provider how often you should have your eyes checked. Personal lifestyle choices, including: Daily care of your teeth and gums. Regular physical activity. Eating a healthy diet. Avoiding tobacco and drug use. Limiting alcohol use. Practicing safe sex. Taking low-dose aspirin every day starting at  age 85. What happens during an annual well check? The services and screenings done by your health care provider during your annual well check will depend on your age, overall health, lifestyle risk factors, and family history of disease. Counseling  Your health care provider may ask you questions about your: Alcohol use. Tobacco use. Drug use. Emotional well-being. Home and relationship well-being. Sexual activity. Eating habits. Work and work Astronomer. Screening  You may have the following tests or measurements: Height, weight, and BMI. Blood pressure. Lipid and cholesterol levels. These may be checked every 5 years, or more frequently if you are over 33 years old. Skin check. Lung cancer screening. You may have this screening every year starting at age 61 if you have a 30-pack-year history of smoking and currently smoke or have quit within the past 15 years. Fecal occult blood test (FOBT) of the stool. You may have this test every year starting at age 32. Flexible sigmoidoscopy or colonoscopy. You may have a sigmoidoscopy every 5 years or a colonoscopy every 10 years starting at age 62. Prostate cancer screening. Recommendations will vary depending on your family history and other risks. Hepatitis C blood test. Hepatitis B blood test. Sexually transmitted disease (STD) testing. Diabetes screening. This is done by checking your blood sugar (glucose) after you have not eaten for a while (fasting). You may have this done every 1-3 years. Discuss your test results, treatment options, and if necessary, the need for more tests with your health care provider. Vaccines  Your health care provider may recommend certain vaccines, such as: Influenza vaccine. This is recommended every year. Tetanus, diphtheria, and acellular pertussis (Tdap, Td) vaccine. You may need a Td booster every 10 years. Zoster vaccine. You may need this after age 55. Pneumococcal 13-valent conjugate (  PCV13) vaccine.  You may need this if you have certain conditions and have not been vaccinated. Pneumococcal polysaccharide (PPSV23) vaccine. You may need one or two doses if you smoke cigarettes or if you have certain conditions. Talk to your health care provider about which screenings and vaccines you need and how often you need them. This information is not intended to replace advice given to you by your health care provider. Make sure you discuss any questions you have with your health care provider. Document Released: 01/10/2016 Document Revised: 09/02/2016 Document Reviewed: 10/15/2015 Elsevier Interactive Patient Education  2017 [employer].  Fall Prevention in the Home Falls can cause injuries. They can happen to people of all ages. There are many things you can do to make your home safe and to help prevent falls. What can I do on the outside of my home? Regularly fix the edges of walkways and driveways and fix any cracks. Remove anything that might make you trip as you walk through a door, such as a raised step or threshold. Trim any bushes or trees on the path to your home. Use bright outdoor lighting. Clear any walking paths of anything that might make someone trip, such as rocks or tools. Regularly check to see if handrails are loose or broken. Make sure that both sides of any steps have handrails. Any raised decks and porches should have guardrails on the edges. Have any leaves, snow, or ice cleared regularly. Use sand or salt on walking paths during winter. Clean up any spills in your garage right away. This includes oil or grease spills. What can I do in the bathroom? Use night lights. Install grab bars by the toilet and in the tub and shower. Do not use towel bars as grab bars. Use non-skid mats or decals in the tub or shower. If you need to sit down in the shower, use a plastic, non-slip stool. Keep the floor dry. Clean up any water that spills on the floor as soon as it happens. Remove  soap buildup in the tub or shower regularly. Attach bath mats securely with double-sided non-slip rug tape. Do not have throw rugs and other things on the floor that can make you trip. What can I do in the bedroom? Use night lights. Make sure that you have a light by your bed that is easy to reach. Do not use any sheets or blankets that are too big for your bed. They should not hang down onto the floor. Have a firm chair that has side arms. You can use this for support while you get dressed. Do not have throw rugs and other things on the floor that can make you trip. What can I do in the kitchen? Clean up any spills right away. Avoid walking on wet floors. Keep items that you use a lot in easy-to-reach places. If you need to reach something above you, use a strong step stool that has a grab bar. Keep electrical cords out of the way. Do not use floor polish or wax that makes floors slippery. If you must use wax, use non-skid floor wax. Do not have throw rugs and other things on the floor that can make you trip. What can I do with my stairs? Do not leave any items on the stairs. Make sure that there are handrails on both sides of the stairs and use them. Fix handrails that are broken or loose. Make sure that handrails are as long as the stairways. Check any  carpeting to make sure that it is firmly attached to the stairs. Fix any carpet that is loose or worn. Avoid having throw rugs at the top or bottom of the stairs. If you do have throw rugs, attach them to the floor with carpet tape. Make sure that you have a light switch at the top of the stairs and the bottom of the stairs. If you do not have them, ask someone to add them for you. What else can I do to help prevent falls? Wear shoes that: Do not have high heels. Have rubber bottoms. Are comfortable and fit you well. Are closed at the toe. Do not wear sandals. If you use a stepladder: Make sure that it is fully opened. Do not climb a  closed stepladder. Make sure that both sides of the stepladder are locked into place. Ask someone to hold it for you, if possible. Clearly mark and make sure that you can see: Any grab bars or handrails. First and last steps. Where the edge of each step is. Use tools that help you move around (mobility aids) if they are needed. These include: Canes. Walkers. Scooters. Crutches. Turn on the lights when you go into a dark area. Replace any light bulbs as soon as they burn out. Set up your furniture so you have a clear path. Avoid moving your furniture around. If any of your floors are uneven, fix them. If there are any pets around you, be aware of where they are. Review your medicines with your doctor. Some medicines can make you feel dizzy. This can increase your chance of falling. Ask your doctor what other things that you can do to help prevent falls. This information is not intended to replace advice given to you by your health care provider. Make sure you discuss any questions you have with your health care provider. Document Released: 10/10/2009 Document Revised: 05/21/2016 Document Reviewed: 01/18/2015 Elsevier Interactive Patient Education  2017 [employer].

## 2021-10-22 ENCOUNTER — Other Ambulatory Visit: Payer: Self-pay

## 2021-10-22 DIAGNOSIS — B20 Human immunodeficiency virus [HIV] disease: Secondary | ICD-10-CM

## 2021-10-30 ENCOUNTER — Other Ambulatory Visit: Payer: Self-pay

## 2021-10-30 ENCOUNTER — Other Ambulatory Visit (HOSPITAL_COMMUNITY)
Admission: RE | Admit: 2021-10-30 | Discharge: 2021-10-30 | Disposition: A | Discharge status: Home / Self Care | Payer: Medicare Other | Source: Ambulatory Visit | Attending: Internal Medicine | Admitting: Internal Medicine

## 2021-10-30 ENCOUNTER — Other Ambulatory Visit: Payer: Medicare Other

## 2021-10-30 DIAGNOSIS — Z113 Encounter for screening for infections with a predominantly sexual mode of transmission: Secondary | ICD-10-CM

## 2021-10-30 DIAGNOSIS — B20 Human immunodeficiency virus [HIV] disease: Secondary | ICD-10-CM

## 2021-10-31 LAB — T-HELPER CELL (CD4) - (RCID CLINIC ONLY)
CD4 % Helper T Cell: 31 % — ABNORMAL LOW (ref 33–65)
CD4 T Cell Abs: 764 /uL (ref 400–1790)

## 2021-11-01 LAB — CBC WITH DIFFERENTIAL/PLATELET
Absolute Monocytes: 558 cells/uL (ref 200–950)
Basophils Absolute: 30 cells/uL (ref 0–200)
Basophils Relative: 0.5 %
Eosinophils Absolute: 78 cells/uL (ref 15–500)
Eosinophils Relative: 1.3 %
HCT: 37.5 % — ABNORMAL LOW (ref 38.5–50.0)
Hemoglobin: 13 g/dL — ABNORMAL LOW (ref 13.2–17.1)
Lymphs Abs: 2370 cells/uL (ref 850–3900)
MCH: 33.5 pg — ABNORMAL HIGH (ref 27.0–33.0)
MCHC: 34.7 g/dL (ref 32.0–36.0)
MCV: 96.6 fL (ref 80.0–100.0)
MPV: 10.7 fL (ref 7.5–12.5)
Monocytes Relative: 9.3 %
Neutro Abs: 2964 cells/uL (ref 1500–7800)
Neutrophils Relative %: 49.4 %
Platelets: 187 10*3/uL (ref 140–400)
RBC: 3.88 10*6/uL — ABNORMAL LOW (ref 4.20–5.80)
RDW: 13.6 % (ref 11.0–15.0)
Total Lymphocyte: 39.5 %
WBC: 6 10*3/uL (ref 3.8–10.8)

## 2021-11-01 LAB — COMPREHENSIVE METABOLIC PANEL
AG Ratio: 1.4 (calc) (ref 1.0–2.5)
ALT: 12 U/L (ref 9–46)
AST: 16 U/L (ref 10–40)
Albumin: 4 g/dL (ref 3.6–5.1)
Alkaline phosphatase (APISO): 76 U/L (ref 36–130)
BUN: 15 mg/dL (ref 7–25)
CO2: 26 mmol/L (ref 20–32)
Calcium: 8.9 mg/dL (ref 8.6–10.3)
Chloride: 103 mmol/L (ref 98–110)
Creat: 1.22 mg/dL (ref 0.60–1.29)
Globulin: 2.8 g/dL (calc) (ref 1.9–3.7)
Glucose, Bld: 102 mg/dL — ABNORMAL HIGH (ref 65–99)
Potassium: 3.8 mmol/L (ref 3.5–5.3)
Sodium: 136 mmol/L (ref 135–146)
Total Bilirubin: 0.4 mg/dL (ref 0.2–1.2)
Total Protein: 6.8 g/dL (ref 6.1–8.1)

## 2021-11-01 LAB — HIV-1 RNA QUANT-NO REFLEX-BLD
HIV 1 RNA Quant: 57 Copies/mL — ABNORMAL HIGH
HIV-1 RNA Quant, Log: 1.75 Log cps/mL — ABNORMAL HIGH

## 2021-11-03 LAB — URINE CYTOLOGY ANCILLARY ONLY
Chlamydia: NEGATIVE
Comment: NEGATIVE
Comment: NORMAL
Neisseria Gonorrhea: NEGATIVE

## 2021-11-13 ENCOUNTER — Encounter: Payer: Self-pay | Admitting: Internal Medicine

## 2021-11-13 ENCOUNTER — Ambulatory Visit (INDEPENDENT_AMBULATORY_CARE_PROVIDER_SITE_OTHER): Payer: Medicare Other | Admitting: Internal Medicine

## 2021-11-13 ENCOUNTER — Other Ambulatory Visit: Payer: Self-pay

## 2021-11-13 VITALS — BP 107/68 | HR 97 | Temp 98.2°F | Wt 133.0 lb

## 2021-11-13 DIAGNOSIS — Z23 Encounter for immunization: Secondary | ICD-10-CM

## 2021-11-13 DIAGNOSIS — Z79899 Other long term (current) drug therapy: Secondary | ICD-10-CM | POA: Diagnosis not present

## 2021-11-13 DIAGNOSIS — J301 Allergic rhinitis due to pollen: Secondary | ICD-10-CM | POA: Diagnosis not present

## 2021-11-13 DIAGNOSIS — B20 Human immunodeficiency virus [HIV] disease: Secondary | ICD-10-CM | POA: Diagnosis not present

## 2021-11-13 MED ORDER — FLUNISOLIDE 25 MCG/ACT (0.025%) NA SOLN: 2.0000 | Freq: Two times a day (BID) | NASAL | 5 refills | Status: DC

## 2021-11-13 NOTE — Progress Notes (Signed)
RFV: follow up for hiv disease  Patient ID: Rodney Walker, male   DOB: 08-Jan-1981, 40 y.o.   MRN: [id number]  HPI  40yo M with hiv disease, copd, seasonal allergies/nasal congestion, congenital clubbing. He has been doing well with adherence, but he is receiving his medications at different times from pharmacy which is complicating it. He reports not taking beconase, was not able to get it covered.  He is doing well overall. He would like assistance with getting his dental care.  Outpatient Encounter Medications as of 11/13/2021  Medication Sig   prasugrel (EFFIENT) 10 MG TABS tablet TAKE 1 TABLET(10 MG) BY MOUTH DAILY   albuterol (VENTOLIN HFA) 108 (90 Base) MCG/ACT inhaler Inhale 2 puffs into the lungs every 6 (six) hours as needed for wheezing or shortness of breath.   aspirin EC 81 MG tablet Take 1 tablet (81 mg total) by mouth 2 (two) times daily.   bictegravir-emtricitabine-tenofovir AF (BIKTARVY) 50-200-25 MG TABS tablet Take 1 tablet by mouth daily.   cetirizine (ZYRTEC) 10 MG tablet TAKE 1 TABLET(10 MG) BY MOUTH DAILY   Cholecalciferol (VITAMIN D3) 125 MCG (5000 UT) CAPS Take 1 capsule (5,000 Units total) by mouth daily.   darunavir-cobicistat (PREZCOBIX) 800-150 MG tablet TAKE 1 TABLET BY MOUTH DAILY WITH BREAKFAST. SWALLOW WHOLE. DO NOT CRUSH, BREAK OR CHEW TABLETS.TAKE WITH FOOD   flunisolide (NASALIDE) 25 MCG/ACT (0.025%) SOLN Place 2 sprays into the nose 2 (two) times daily. (Patient not taking: Reported on 09/12/2021)   metoprolol succinate (TOPROL-XL) 25 MG 24 hr tablet TAKE 1 TABLET(25 MG) BY MOUTH DAILY.   nitroGLYCERIN (NITROSTAT) 0.4 MG SL tablet Place 1 tablet (0.4 mg total) under the tongue every 5 (five) minutes x 3 doses as needed for chest pain.   rosuvastatin (CRESTOR) 10 MG tablet Take 1 tablet (10 mg total) by mouth daily.   Sodium Sulfate-Mag Sulfate-KCl (SUTAB) [phone] MG TABS Take 1 kit by mouth as directed. MANUFACTURER CODES!! BIN: [id number]  PCN: CN GROUP: [id number] MEMBER ID: [id number];[employer] AS SECONDARY INSURANCE ;NO PRIOR AUTHORIZATION   No facility-administered encounter medications on file as of 11/13/2021.     Patient Active Problem List   Diagnosis Date Noted   Chest pain 09/30/2020   Hemorrhoid 09/30/2020   Avascular necrosis of bone of right hip ([place]) 09/25/2019   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   Hyperlipidemia 04/05/2018   Ischemic cardiomyopathy 04/05/2018   History of acute inferior wall MI 03/24/2018   Marijuana abuse, continuous 10/29/2016   Moderate persistent asthma without complication 41/36/1443   Allergic rhinitis 10/08/2016   Anemia 05/03/2015   Lung abscess ([place]) 04/22/2015   Bullous emphysema ([facility])    Lobar pneumonia ([place])    Empyema lung ([place]) 04/16/2015   HIV (human immunodeficiency virus infection) ([place])    Tobacco abuse 03/21/2015   Lung mass 41/40/0867   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  HENT: Negative for congestion, sore throat, rhinorrhea, sneezing, trouble swallowing and sinus pressure.  Eyes:  Negative for photophobia and visual disturbance.  Respiratory: Negative for cough, chest tightness, shortness of breath, wheezing and stridor.  Cardiovascular: Negative for chest pain, palpitations and leg swelling.  Gastrointestinal: Negative for nausea, vomiting, abdominal pain, diarrhea, constipation, blood in stool, abdominal distention and anal  bleeding.  Genitourinary: Negative for dysuria, hematuria, flank pain and difficulty urinating.  Musculoskeletal: Negative for myalgias, back pain, joint swelling, arthralgias and gait problem.  Skin: Negative for color change, pallor, rash and wound.  Neurological: Negative for dizziness, tremors, weakness and light-headedness.  Hematological: Negative for adenopathy. Does not bruise/bleed easily.  Psychiatric/Behavioral: Negative for behavioral problems, confusion, sleep disturbance, dysphoric mood, decreased concentration and agitation.   Physical Exam   Wt 133 lb (60.3 kg)   BMI 22.13 kg/m   BP 107/68   Pulse 97   Temp 98.2 F (36.8 C) (Temporal)   Wt 133 lb (60.3 kg)   BMI 22.13 kg/m  Physical Exam  Constitutional: He is oriented to person, place, and time. He appears well-developed and well-nourished. No distress.  HENT:  Mouth/Throat: Oropharynx is clear and moist. No oropharyngeal exudate.  Cardiovascular: Normal rate, regular rhythm and normal heart sounds. Exam reveals no gallop and no friction rub.  No murmur heard.  Pulmonary/Chest: Effort normal and breath sounds normal. No respiratory distress. He has no wheezes.  Abdominal: Soft. Bowel sounds are normal. He exhibits no distension. There is no tenderness.  Lymphadenopathy:  He has no cervical adenopathy.  Neurological: He is alert and oriented to person, place, and time.  Skin: Skin is warm and dry. No rash noted. No erythema.  Psychiatric: He has a normal mood and affect. His behavior is normal.   Lab Results  Component Value Date   CD4TCELL 31 (L) 10/30/2021   Lab Results  Component Value Date   CD4TABS 764 10/30/2021   CD4TABS 1,021 08/12/2021   CD4TABS 685 12/23/2020   Lab Results  Component Value Date   HIV1RNAQUANT 57 (H) 10/30/2021   No results found for: HEPBSAB Lab Results  Component Value Date   LABRPR REACTIVE (A) 08/12/2021    CBC Lab Results  Component Value Date   WBC 6.0 10/30/2021    RBC 3.88 (L) 10/30/2021   HGB 13.0 (L) 10/30/2021   HCT 37.5 (L) 10/30/2021   PLT 187 10/30/2021   MCV 96.6 10/30/2021   MCH 33.5 (H) 10/30/2021   MCHC 34.7 10/30/2021   RDW 13.6 10/30/2021   LYMPHSABS 2,370 10/30/2021   MONOABS 0.7 09/19/2019   EOSABS 78 10/30/2021    BMET Lab Results  Component Value Date   NA 136 10/30/2021   K 3.8 10/30/2021   CL 103 10/30/2021   CO2 26 10/30/2021   GLUCOSE 102 (H) 10/30/2021   BUN 15 10/30/2021   CREATININE 1.22 10/30/2021   CALCIUM 8.9 10/30/2021   GFRNONAA 84 12/23/2020   GFRAA 97 12/23/2020      Assessment and Plan  Hiv disease = well controlled, continue on current regimen  Long term medication = cr is stable  Non-Seasonal allergies =  Will refill nasalide  Health maintenance = flu shot shot today and monkeypox in the future;Difficulty getting to see dentist- refer to [facility]

## 2021-11-20 ENCOUNTER — Other Ambulatory Visit: Payer: Self-pay | Admitting: Emergency Medicine

## 2021-11-20 DIAGNOSIS — I2510 Atherosclerotic heart disease of native coronary artery without angina pectoris: Secondary | ICD-10-CM

## 2021-11-24 ENCOUNTER — Other Ambulatory Visit: Payer: Self-pay | Admitting: *Deleted

## 2021-11-24 MED ORDER — PRASUGREL HCL 10 MG PO TABS: ORAL_TABLET | ORAL | 0 refills | Status: DC

## 2021-12-01 ENCOUNTER — Other Ambulatory Visit: Payer: Self-pay

## 2021-12-01 ENCOUNTER — Ambulatory Visit (HOSPITAL_COMMUNITY)
Admission: EM | Admit: 2021-12-01 | Discharge: 2021-12-01 | Disposition: A | Discharge status: Home / Self Care | Payer: Medicare Other | Attending: Sports Medicine | Admitting: Sports Medicine

## 2021-12-01 ENCOUNTER — Encounter (HOSPITAL_COMMUNITY): Payer: Self-pay

## 2021-12-01 DIAGNOSIS — J302 Other seasonal allergic rhinitis: Secondary | ICD-10-CM

## 2021-12-01 DIAGNOSIS — B20 Human immunodeficiency virus [HIV] disease: Secondary | ICD-10-CM | POA: Diagnosis not present

## 2021-12-01 DIAGNOSIS — J029 Acute pharyngitis, unspecified: Secondary | ICD-10-CM

## 2021-12-01 MED ORDER — NYSTATIN 100000 UNIT/ML MT SUSP: 500000.0000 [IU] | Freq: Four times a day (QID) | OROMUCOSAL | 0 refills | Status: AC

## 2021-12-01 NOTE — ED Triage Notes (Signed)
Pt presents with a sore throat x 1 week.   States he has body aches.

## 2021-12-01 NOTE — ED Provider Notes (Signed)
[facility]    CSN: [id number] Arrival date & time: 12/01/21  1959      History   Chief Complaint Chief Complaint  Patient presents with   Sore Throat    HPI Rodney Walker is a 40 y.o. male here for sore throat   Sore Throat Pertinent negatives include no chest pain, no headaches and no shortness of breath.   Patient presents with pharyngitis for about 1 week.  He does have a history of seasonal allergies and recently restarted some new medications including cetirizine, Nasalide.  He does have history of seasonal allergies.  He has had a runny nose, but states this is an ongoing for about 10 months.  He feels somewhat of a burning sensation in the back of his throat, and over the last 2 days feels like he has some soreness over the Adam's apple of his throat.  Denies this happening in the past.  Patient is HIV active, he currently is taking Biktarvy.  Other than the sore throat, he states he is feeling well.  Does have some chronic runny nose, otherwise no nasal congestion, ear pain, chest pain or shortness of breath.  Denies any specific fever or chills.   Past Medical History:  Diagnosis Date   Anemia    COPD (chronic obstructive pulmonary disease) ([place])    Coronary artery disease    MI 2010 - dissection/plaque rupture in LAD tx with antiplt/antithrombotics >> relook cath with resolution (no PCI performed) // Inf STEMI 3/19: OM1 30; pRCA 100 >> DES // Echo 3/19:  mild LVH, EF 40-45, inf/inf-sept HK, trivial MR   Eczema    History of blood clots    History of myocardial infarction 2010; 2019   History of pleural empyema    HIV infection ([place])    Hyperlipidemia    Hypertension    Immune deficiency disorder ([place])    Ischemic cardiomyopathy 04/05/2018   Moderate persistent asthma without complication 27/04/1699   Myocardial infarction ([place])    2009, 2019   Pneumonia     Patient Active Problem List   Diagnosis Date Noted   Chest pain 09/30/2020    Hemorrhoid 09/30/2020   Avascular necrosis of bone of right hip ([place]) 09/25/2019   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   Hyperlipidemia 04/05/2018   Ischemic cardiomyopathy 04/05/2018   History of acute inferior wall MI 03/24/2018   Marijuana abuse, continuous 10/29/2016   Moderate persistent asthma without complication 17/49/4496   Allergic rhinitis 10/08/2016   Anemia 05/03/2015   Lung abscess ([place]) 04/22/2015   Bullous emphysema ([place])    Lobar pneumonia ([place])    Empyema lung ([place]) 04/16/2015   HIV (human immunodeficiency virus infection) ([place])    Tobacco abuse 03/21/2015   Lung mass 75/91/6384   Eosinophilic folliculitis 66/59/9357   Myalgia 04/23/2014   AIDS ([place]) 03/22/2014   CAP (community acquired pneumonia) 03/22/2014   CAD (coronary artery disease) 11/13/2011    Past Surgical History:  Procedure Laterality Date   CARDIAC CATHETERIZATION  06/24/2009   EF 60%   CARDIAC CATHETERIZATION  06/17/2009   EF 45%. ANTERIOR HYPOKINESIS   CORONARY STENT INTERVENTION N/A 03/24/2018   Procedure: CORONARY STENT INTERVENTION;  Surgeon: Sherren Mocha, MD;  Location: [facility];  Service: Cardiovascular;  Laterality: N/A;   CORONARY/GRAFT ACUTE MI REVASCULARIZATION N/A 03/24/2018   Procedure: Coronary/Graft Acute MI Revascularization;  Surgeon: Sherren Mocha, MD;  Location: [facility];  Service: Cardiovascular;  Laterality:  N/A;   LEFT HEART CATH AND CORONARY ANGIOGRAPHY N/A 03/24/2018   Procedure: LEFT HEART CATH AND CORONARY ANGIOGRAPHY;  Surgeon: Sherren Mocha, MD;  Location: [facility];  Service: Cardiovascular;  Laterality: N/A;   TOTAL HIP ARTHROPLASTY Right 09/25/2019   Procedure: RIGHT TOTAL HIP ARTHROPLASTY ANTERIOR APPROACH;  Surgeon: Leandrew Koyanagi, MD;  Location: [facility];  Service: Orthopedics;  Laterality: Right;   US ECHOCARDIOGRAPHY  12/09/2009   EF 55-60%   VIDEO ASSISTED THORACOSCOPY (VATS)/ LOBECTOMY Right 04/23/2015   Procedure:  VIDEO ASSISTED THORACOSCOPY (VATS)/RIGHT upper  and right middle LOBECTOMY;  Surgeon: Ivin Poot, MD;  Location: [facility];  Service: Thoracic;  Laterality: Right;   VIDEO BRONCHOSCOPY Bilateral 09/05/2014   Procedure: VIDEO BRONCHOSCOPY WITH FLUORO;  Surgeon: Wilhelmina Mcardle, MD;  Location: [facility] ENDOSCOPY;  Service: Cardiopulmonary;  Laterality: Bilateral;   VIDEO BRONCHOSCOPY Bilateral 04/10/2015   Procedure: VIDEO BRONCHOSCOPY WITH FLUORO;  Surgeon: Juanito Doom, MD;  Location: [facility] ENDOSCOPY;  Service: Cardiopulmonary;  Laterality: Bilateral;       Home Medications    Prior to Admission medications   Medication Sig Start Date End Date Taking? Authorizing Provider  nystatin (MYCOSTATIN) 100000 UNIT/ML suspension Take 5 mLs (500,000 Units total) by mouth 4 (four) times daily for 10 days. 12/01/21 12/11/21 Yes Elba Barman, DO  albuterol (VENTOLIN HFA) 108 (90 Base) MCG/ACT inhaler Inhale 2 puffs into the lungs every 6 (six) hours as needed for wheezing or shortness of breath. 09/19/20   [place] Callas, NP  aspirin EC 81 MG tablet Take 1 tablet (81 mg total) by mouth 2 (two) times daily. 09/25/19   Leandrew Koyanagi, MD  bictegravir-emtricitabine-tenofovir AF (BIKTARVY) 50-200-25 MG TABS tablet Take 1 tablet by mouth daily. 02/12/21   [place] Basques, MD  cetirizine (ZYRTEC) 10 MG tablet TAKE 1 TABLET(10 MG) BY MOUTH DAILY 05/30/21   Carlyle Basques, MD  Cholecalciferol (VITAMIN D3) 125 MCG (5000 UT) CAPS Take 1 capsule (5,000 Units total) by mouth daily. 09/24/20   [place] Callas, NP  darunavir-cobicistat (PREZCOBIX) 800-150 MG tablet TAKE 1 TABLET BY MOUTH DAILY WITH BREAKFAST. SWALLOW WHOLE. DO NOT CRUSH, BREAK OR CHEW TABLETS.TAKE WITH FOOD 02/12/21   Carlyle Basques, MD  flunisolide (NASALIDE) 25 MCG/ACT (0.025%) SOLN Place 2 sprays into the nose 2 (two) times daily. 11/13/21   Carlyle Basques, MD  metoprolol succinate (TOPROL-XL) 25 MG 24 hr tablet TAKE 1 TABLET(25 MG) BY MOUTH DAILY.  11/08/20   [name], Wonda Cheng, MD  nitroGLYCERIN (NITROSTAT) 0.4 MG SL tablet Place 1 tablet (0.4 mg total) under the tongue every 5 (five) minutes x 3 doses as needed for chest pain. 06/27/20   [name], Wonda Cheng, MD  prasugrel (EFFIENT) 10 MG TABS tablet TAKE 1 TABLET(10 MG) BY MOUTH DAILY 11/24/21   [name], Wonda Cheng, MD  rosuvastatin (CRESTOR) 10 MG tablet TAKE 1 TABLET(10 MG) BY MOUTH DAILY 11/21/21   Horald Pollen, MD  Sodium Sulfate-Mag Sulfate-KCl (SUTAB) 865 123 1419 MG TABS Take 1 kit by mouth as directed. MANUFACTURER CODES!! BIN: [id number] PCN: CN GROUP: [id number] MEMBER ID: [id number];[employer] AS SECONDARY INSURANCE ;NO PRIOR AUTHORIZATION 12/04/20   Willia Craze, NP    Family History Family History  Problem Relation Age of Onset   Diabetes Mother    Eczema Mother    Asthma Mother    COPD Mother    Hypertension Mother    Hyperlipidemia Mother    Stroke Mother    Heart disease Mother  Sleep apnea Mother    Liver cancer Father    Asthma Brother    Diabetes Maternal Grandmother    Heart failure Maternal Grandmother     Social History Social History   Tobacco Use   Smoking status: Former    Packs/day: 0.25    Years: 17.00    Pack years: 4.25    Types: Cigarettes, Cigars    Quit date: 03/23/2015    Years since quitting: 6.6   Smokeless tobacco: Never  Vaping Use   Vaping Use: Never used  Substance Use Topics   Alcohol use: No    Alcohol/week: 0.0 standard drinks   Drug use: Not Currently    Frequency: 7.0 times per week    Types: Marijuana     Allergies   Patient has no known allergies.   Review of Systems Review of Systems  Constitutional:  Negative for chills and fever.  HENT:  Positive for rhinorrhea and sore throat. Negative for congestion and trouble swallowing.   Respiratory:  Negative for cough and shortness of breath.   Cardiovascular:  Negative for chest pain.  Neurological:  Negative for headaches.    Physical Exam Triage Vital Signs ED  Triage Vitals  Enc Vitals Group     BP 12/01/21 2010 103/68     Pulse Rate 12/01/21 2010 82     Resp 12/01/21 2010 18     Temp 12/01/21 2010 98.3 F (36.8 C)     Temp Source 12/01/21 2010 Oral     SpO2 12/01/21 2010 95 %     Weight --      Height --      Head Circumference --      Peak Flow --      Pain Score 12/01/21 2009 3     Pain Loc --      Pain Edu? --      Excl. in [place]? --    No data found.  Updated Vital Signs BP 103/68 (BP Location: Right Arm)   Pulse 82   Temp 98.3 F (36.8 C) (Oral)   Resp 18   SpO2 95%   Physical Exam Constitutional:      General: He is not in acute distress.    Appearance: Normal appearance. He is not ill-appearing.  HENT:     Head: Normocephalic and atraumatic.     Nose: Nose normal. No congestion or rhinorrhea.     Comments: + erythematous nasal turbinates, slightly boggy    Mouth/Throat:     Comments: + cobblestoning + there is some mild white plaques in the posterior oropharynx, no palate or bucal lesions - tonsillar exudates Eyes:     Extraocular Movements: Extraocular movements intact.     Pupils: Pupils are equal, round, and reactive to light.  Cardiovascular:     Rate and Rhythm: Normal rate.  Pulmonary:     Effort: Pulmonary effort is normal.  Skin:    Capillary Refill: Capillary refill takes less than 2 seconds.  Neurological:     Mental Status: He is alert.  Psychiatric:        Mood and Affect: Mood normal.     UC Treatments / Results  Labs (all labs ordered are listed, but only abnormal results are displayed) Labs Reviewed - No data to display  EKG   Radiology No results found.  Procedures Procedures (including critical care time)  Medications Ordered in UC Medications - No data to display  Initial Impression / Assessment and Plan /  UC Course  I have reviewed the triage vital signs and the nursing notes.  Pertinent labs & imaging results that were available during my care of the patient were reviewed  by me and considered in my medical decision making (see chart for details).     Pharyngitis - suspicious for esophageal candidiasis HIV infection, active Seasonal allergic rhinitis  Patient has had sore throat for 1 week in the setting of current HIV infection.  Most recent labs 10/30/2021 with generally well-controlled with CD4 count 764, HIV viral load 57.  He does have a few white spots on the back of his posterior oropharynx, suspicious for possible esophageal candidiasis.  There is no lesions on his tonsils, palate or buccal mucosa.  He does have some episode of cobblestoning and allergic rhinitis in the nose as well.  Given this, we elected to treat with nystatin suspension gargle and swallow 4 times daily for the next 10 days.  He will also continue his allergy medication with cetirizine and Nasalide.  Discussed importance of looking for symptoms such as fever or chills, signs of systemic illness, which would warrant return to urgent care or ED.  He will follow-up with his PCP as well over the next few days.  He is safe for discharge home Final Clinical Impressions(s) / UC Diagnoses   Final diagnoses:  Pharyngitis, unspecified etiology  HIV disease ([place])  Seasonal allergic rhinitis, unspecified trigger   Discharge Instructions   None    ED Prescriptions     Medication Sig Dispense Auth. Provider   nystatin (MYCOSTATIN) 100000 UNIT/ML suspension Take 5 mLs (500,000 Units total) by mouth 4 (four) times daily for 10 days. 60 mL Elba Barman, DO      PDMP not reviewed this encounter.   Elba Barman, DO 12/01/21 2047

## 2021-12-26 ENCOUNTER — Other Ambulatory Visit: Payer: Self-pay

## 2021-12-26 MED ORDER — METOPROLOL SUCCINATE ER 25 MG PO TB24: 25.0000 mg | ORAL_TABLET | Freq: Every day | ORAL | 0 refills | Status: DC

## 2021-12-27 ENCOUNTER — Ambulatory Visit (HOSPITAL_COMMUNITY)
Admission: EM | Admit: 2021-12-27 | Discharge: 2021-12-27 | Disposition: A | Discharge status: Home / Self Care | Payer: Medicare Other | Attending: Urgent Care | Admitting: Urgent Care

## 2021-12-27 ENCOUNTER — Other Ambulatory Visit: Payer: Self-pay

## 2021-12-27 ENCOUNTER — Encounter (HOSPITAL_COMMUNITY): Payer: Self-pay

## 2021-12-27 ENCOUNTER — Ambulatory Visit (INDEPENDENT_AMBULATORY_CARE_PROVIDER_SITE_OTHER): Payer: Medicare Other

## 2021-12-27 DIAGNOSIS — M25531 Pain in right wrist: Secondary | ICD-10-CM

## 2021-12-27 DIAGNOSIS — R0982 Postnasal drip: Secondary | ICD-10-CM

## 2021-12-27 DIAGNOSIS — J029 Acute pharyngitis, unspecified: Secondary | ICD-10-CM

## 2021-12-27 DIAGNOSIS — B20 Human immunodeficiency virus [HIV] disease: Secondary | ICD-10-CM

## 2021-12-27 DIAGNOSIS — J3089 Other allergic rhinitis: Secondary | ICD-10-CM | POA: Diagnosis not present

## 2021-12-27 DIAGNOSIS — B370 Candidal stomatitis: Secondary | ICD-10-CM

## 2021-12-27 DIAGNOSIS — R490 Dysphonia: Secondary | ICD-10-CM | POA: Diagnosis not present

## 2021-12-27 LAB — POCT RAPID STREP A, ED / UC: Streptococcus, Group A Screen (Direct): NEGATIVE

## 2021-12-27 MED ORDER — FLUCONAZOLE 150 MG PO TABS: ORAL_TABLET | ORAL | 0 refills | Status: DC

## 2021-12-27 MED ORDER — PREDNISONE 20 MG PO TABS: ORAL_TABLET | ORAL | 0 refills | Status: DC

## 2021-12-27 NOTE — ED Triage Notes (Signed)
Pt reports a sore throat that has not improved and c/o white spots on his tongue and throat. States the white spots went away and c/o his throat feeling horse.   Pt states he has not been able to move his R wrist as much and states it might be fractured.

## 2021-12-27 NOTE — ED Provider Notes (Signed)
[place]   MRN: [id number] DOB: 06/28/1981  Subjective:   Rodney Walker is a 40 y.o. male with pmh of HIV, bullous emphysema, ischemic cardiomyopathy presenting for ongoing throat pain, hoarseness.  Patient was last seen 12/01/2021 and treated for esophageal candidiasis with nystatin suspension for 10 days.  He was also advised to continue his allergy treatment with cetirizine and Nasalide.  Reports that this did help him some but he still has difficulty with his throat pain and feeling hoarse.  It is affecting his ability to sing.  No cough, chest pain, shortness of breath or wheezing.  He is taking a Zyrtec daily.  Does not take any other allergy medications.  Last check on his CD4 T-cell counts from 10/30/2021 were 764 and HIV viral load of 57.  He is also had 1 week history of persistent right wrist pain, decreased range of motion.  No fall, trauma, swelling, bruising, weakness, numbness or tingling.  No history of musculoskeletal disorders.  No current facility-administered medications for this encounter.  Current Outpatient Medications:    albuterol (VENTOLIN HFA) 108 (90 Base) MCG/ACT inhaler, Inhale 2 puffs into the lungs every 6 (six) hours as needed for wheezing or shortness of breath., Disp: 8 g, Rfl: 2   aspirin EC 81 MG tablet, Take 1 tablet (81 mg total) by mouth 2 (two) times daily., Disp: 84 tablet, Rfl: 0   bictegravir-emtricitabine-tenofovir AF (BIKTARVY) 50-200-25 MG TABS tablet, Take 1 tablet by mouth daily., Disp: 30 tablet, Rfl: 11   cetirizine (ZYRTEC) 10 MG tablet, TAKE 1 TABLET(10 MG) BY MOUTH DAILY, Disp: 90 tablet, Rfl: 0   Cholecalciferol (VITAMIN D3) 125 MCG (5000 UT) CAPS, Take 1 capsule (5,000 Units total) by mouth daily., Disp: 30 capsule, Rfl: 2   darunavir-cobicistat (PREZCOBIX) 800-150 MG tablet, TAKE 1 TABLET BY MOUTH DAILY WITH BREAKFAST. SWALLOW WHOLE. DO NOT CRUSH, BREAK OR CHEW TABLETS.TAKE WITH FOOD, Disp: 30 tablet, Rfl:  11   flunisolide (NASALIDE) 25 MCG/ACT (0.025%) SOLN, Place 2 sprays into the nose 2 (two) times daily., Disp: 25 mL, Rfl: 5   metoprolol succinate (TOPROL-XL) 25 MG 24 hr tablet, Take 1 tablet (25 mg total) by mouth daily. Please call to schedule appointment for future refills., Disp: 30 tablet, Rfl: 0   nitroGLYCERIN (NITROSTAT) 0.4 MG SL tablet, Place 1 tablet (0.4 mg total) under the tongue every 5 (five) minutes x 3 doses as needed for chest pain., Disp: 25 tablet, Rfl: 0   prasugrel (EFFIENT) 10 MG TABS tablet, TAKE 1 TABLET(10 MG) BY MOUTH DAILY, Disp: 30 tablet, Rfl: 0   rosuvastatin (CRESTOR) 10 MG tablet, TAKE 1 TABLET(10 MG) BY MOUTH DAILY, Disp: 90 tablet, Rfl: 3   Sodium Sulfate-Mag Sulfate-KCl (SUTAB) [phone] MG TABS, Take 1 kit by mouth as directed. MANUFACTURER CODES!! BIN: [id number] PCN: CN GROUP: [id number] MEMBER ID: [id number];[employer] AS SECONDARY INSURANCE ;NO PRIOR AUTHORIZATION, Disp: 24 tablet, Rfl: 0   No Known Allergies  Past Medical History:  Diagnosis Date   Anemia    COPD (chronic obstructive pulmonary disease) ([place])    Coronary artery disease    MI 2010 - dissection/plaque rupture in LAD tx with antiplt/antithrombotics >> relook cath with resolution (no PCI performed) // Inf STEMI 3/19: OM1 30; pRCA 100 >> DES // Echo 3/19:  mild LVH, EF 40-45, inf/inf-sept HK, trivial MR   Eczema    History of blood clots    History of myocardial infarction 2010; 2019   History of  pleural empyema    HIV infection ([place])    Hyperlipidemia    Hypertension    Immune deficiency disorder ([place])    Ischemic cardiomyopathy 04/05/2018   Moderate persistent asthma without complication 32/08/5187   Myocardial infarction [facility])    2009, 2019   Pneumonia      Past Surgical History:  Procedure Laterality Date   CARDIAC CATHETERIZATION  06/24/2009   EF 60%   CARDIAC CATHETERIZATION  06/17/2009   EF 45%. ANTERIOR HYPOKINESIS   CORONARY STENT INTERVENTION N/A 03/24/2018   Procedure: CORONARY  STENT INTERVENTION;  Surgeon: Sherren Mocha, MD;  Location: [place] [facility];  Service: Cardiovascular;  Laterality: N/A;   CORONARY/GRAFT ACUTE MI REVASCULARIZATION N/A 03/24/2018   Procedure: Coronary/Graft Acute MI Revascularization;  Surgeon: Sherren Mocha, MD;  Location: [place] [facility];  Service: Cardiovascular;  Laterality: N/A;   LEFT HEART CATH AND CORONARY ANGIOGRAPHY N/A 03/24/2018   Procedure: LEFT HEART CATH AND CORONARY ANGIOGRAPHY;  Surgeon: Sherren Mocha, MD;  Location: [place] [facility];  Service: Cardiovascular;  Laterality: N/A;   TOTAL HIP ARTHROPLASTY Right 09/25/2019   Procedure: RIGHT TOTAL HIP ARTHROPLASTY ANTERIOR APPROACH;  Surgeon: Leandrew Koyanagi, MD;  Location: [place];  Service: Orthopedics;  Laterality: Right;   US ECHOCARDIOGRAPHY  12/09/2009   EF 55-60%   VIDEO ASSISTED THORACOSCOPY (VATS)/ LOBECTOMY Right 04/23/2015   Procedure: VIDEO ASSISTED THORACOSCOPY (VATS)/RIGHT upper  and right middle LOBECTOMY;  Surgeon: Ivin Poot, MD;  Location: [place];  Service: Thoracic;  Laterality: Right;   VIDEO BRONCHOSCOPY Bilateral 09/05/2014   Procedure: VIDEO BRONCHOSCOPY WITH FLUORO;  Surgeon: Wilhelmina Mcardle, MD;  Location[facility] END[facility];  Service: Cardiopulmonary;  Laterality: Bilateral;   VIDEO BRONCHOSCOPY Bilateral 04/10/2015   Procedure: VIDEO BRONCHOSCOPY WITH FLUORO;  Surgeon: Juanito Doom, MD;  Location: [facility] ENDOSCOPY;  Service: Cardiopulmonary;  Laterality: Bilateral;    Family History  Problem Relation Age of Onset   Diabetes Mother    Eczema Mother    Asthma Mother    COPD Mother    Hypertension Mother    Hyperlipidemia Mother    Stroke Mother    Heart disease Mother    Sleep apnea Mother    Liver cancer Father    Asthma Brother    Diabetes Maternal Grandmother    Heart failure Maternal Grandmother     Social History   Tobacco Use   Smoking status: Former    Packs/day: 0.25    Years: 17.00    Pack years: 4.25    Types: Cigarettes,  Cigars    Quit date: 03/23/2015    Years since quitting: 6.7   Smokeless tobacco: Never  Vaping Use   Vaping Use: Never used  Substance Use Topics   Alcohol use: No    Alcohol/week: 0.0 standard drinks   Drug use: Not Currently    Frequency: 7.0 times per week    Types: Marijuana    ROS   Objective:   Vitals: BP 110/76    Pulse 82    Temp 98.7 F (37.1 C) (Oral)    Resp 17    SpO2 95%   Physical Exam Constitutional:      General: He is not in acute distress.    Appearance: Normal appearance. He is well-developed and normal weight. He is not ill-appearing, toxic-appearing or diaphoretic.  HENT:     Head: Normocephalic and atraumatic.     Right Ear: Tympanic membrane, ear canal and external ear normal. There is  no impacted cerumen.     Left Ear: Tympanic membrane, ear canal and external ear normal. There is no impacted cerumen.     Nose: Nose normal. No congestion or rhinorrhea.     Mouth/Throat:     Mouth: Mucous membranes are moist.     Pharynx: No pharyngeal swelling, oropharyngeal exudate, posterior oropharyngeal erythema or uvula swelling.     Comments: Geographic tongue posteriorly.  [place] postnasal drainage overlying pharynx. Eyes:     General: No scleral icterus.       Right eye: No discharge.        Left eye: No discharge.     Extraocular Movements: Extraocular movements intact.     Conjunctiva/sclera: Conjunctivae normal.     Pupils: Pupils are equal, round, and reactive to light.  Cardiovascular:     Rate and Rhythm: Normal rate.  Pulmonary:     Effort: Pulmonary effort is normal.  Musculoskeletal:     Right wrist: Tenderness (mostly over radial aspect) and snuff box tenderness present. No swelling, deformity, effusion, lacerations, bony tenderness or crepitus. Decreased range of motion (near full ROM). Normal pulse.     Cervical back: Normal range of motion and neck supple. No rigidity. No muscular tenderness.  Neurological:     General: No focal  deficit present.     Mental Status: He is alert and oriented to person, place, and time.  Psychiatric:        Mood and Affect: Mood normal.        Behavior: Behavior normal.        Thought Content: Thought content normal.        Judgment: Judgment normal.   DG Wrist Complete Right  Result Date: 12/27/2021 CLINICAL DATA:  Acute right wrist pain without known injury EXAM: RIGHT WRIST - COMPLETE 3+ VIEW COMPARISON:  None. FINDINGS: There is no evidence of fracture or dislocation. There is no evidence of arthropathy or other focal bone abnormality. Soft tissues are unremarkable. IMPRESSION: Negative. Electronically Signed   By: Marijo Conception M.D.   On: 12/27/2021 11:47    Results for orders placed or performed during the hospital encounter of 12/27/21 (from the past 24 hour(s))  POCT Rapid Strep A     Status: None   Collection Time: 12/27/21 11:22 AM  Result Value Ref Range   Streptococcus, Group A Screen (Direct) NEGATIVE NEGATIVE     Recent Results (from the past 2160 hour(s))  T-helper cell (CD4)- ([facility] clinic only)     Status: Abnormal   Collection Time: 10/30/21  4:06 PM  Result Value Ref Range   CD4 T Cell Abs 764 400 - 1,790 /uL   CD4 % Helper T Cell 31 (L) 33 - 65 %    Comment: Performed at [facility], [place] [address], [place], [place] [address]  HIV-1 RNA quant-no reflex-bld     Status: Abnormal   Collection Time: 10/30/21  4:44 PM  Result Value Ref Range   HIV 1 RNA Quant 57 (H) Copies/mL   HIV-1 RNA Quant, Log 1.75 (H) Log cps/mL    Comment: . Reference Range:                           Not Detected     copies/mL                           Not Detected Log copies/mL . Marland Kitchen  The test was performed using Real-Time Polymerase Chain Reaction. . . Reportable Range: 20 copies/mL to 10,000,000 copies/mL (1.30 Log copies/mL to 7.00 Log copies/mL). .   Comprehensive metabolic panel     Status: Abnormal   Collection Time: 10/30/21  4:44 PM  Result Value  Ref Range   Glucose, Bld 102 (H) 65 - 99 mg/dL    Comment: .            Fasting reference interval . For someone without known diabetes, a glucose value between 100 and 125 mg/dL is consistent with prediabetes and should be confirmed with a follow-up test. .    BUN 15 7 - 25 mg/dL   Creat 1.22 0.60 - 1.29 mg/dL   BUN/Creatinine Ratio NOT APPLICABLE 6 - 22 (calc)   Sodium 136 135 - 146 mmol/L   Potassium 3.8 3.5 - 5.3 mmol/L   Chloride 103 98 - 110 mmol/L   CO2 26 20 - 32 mmol/L   Calcium 8.9 8.6 - 10.3 mg/dL   Total Protein 6.8 6.1 - 8.1 g/dL   Albumin 4.0 3.6 - 5.1 g/dL   Globulin 2.8 1.9 - 3.7 g/dL (calc)   AG Ratio 1.4 1.0 - 2.5 (calc)   Total Bilirubin 0.4 0.2 - 1.2 mg/dL   Alkaline phosphatase (APISO) 76 36 - 130 U/L   AST 16 10 - 40 U/L   ALT 12 9 - 46 U/L  CBC with Differential/Platelet     Status: Abnormal   Collection Time: 10/30/21  4:44 PM  Result Value Ref Range   WBC 6.0 3.8 - 10.8 Thousand/uL   RBC 3.88 (L) 4.20 - 5.80 Million/uL   Hemoglobin 13.0 (L) 13.2 - 17.1 g/dL   HCT 37.5 (L) 38.5 - 50.0 %   MCV 96.6 80.0 - 100.0 fL   MCH 33.5 (H) 27.0 - 33.0 pg   MCHC 34.7 32.0 - 36.0 g/dL   RDW 13.6 11.0 - 15.0 %   Platelets 187 140 - 400 Thousand/uL   MPV 10.7 7.5 - 12.5 fL   Neutro Abs 2,964 1,500 - 7,800 cells/uL   Lymphs Abs 2,370 850 - 3,900 cells/uL   Absolute Monocytes 558 200 - 950 cells/uL   Eosinophils Absolute 78 15 - 500 cells/uL   Basophils Absolute 30 0 - 200 cells/uL   Neutrophils Relative % 49.4 %   Total Lymphocyte 39.5 %   Monocytes Relative 9.3 %   Eosinophils Relative 1.3 %   Basophils Relative 0.5 %  Urine cytology ancillary only     Status: None   Collection Time: 10/30/21  4:45 PM  Result Value Ref Range   Neisseria Gonorrhea Negative    Chlamydia Negative    Comment Normal Reference Ranger Chlamydia - Negative    Comment      Normal Reference Range Neisseria Gonorrhea - Negative     Assessment and Plan :   PDMP not reviewed  this encounter.  1. Sore throat   2. Post-nasal drainage   3. Oral candidiasis   4. Hoarseness of voice   5. Allergic rhinitis due to other allergic trigger, unspecified seasonality   6. HIV disease ([place])   7. Acute wrist pain, right    Will be using oral fluconazole for 1 week as he failed treatment with topical nystatin.  He also continues to have hoarseness, throat pain and will attempt to address this as an allergic rhinitis causing postnasal drainage and therefore his symptoms ; use a 5-day course of oral prednisone.  Recommended maintaining Zyrtec.  X-ray was negative for the wrist.  Recommended follow-up with Ortho.  Advised that the prednisone may help with anti-inflammatory properties.  However, he may benefit from physical therapy as designated by an orthopedist. Counseled patient on potential for adverse effects with medications prescribed/recommended today, ER and return-to-clinic precautions discussed, patient verbalized understanding.    Jaynee Eagles, [place] 12/27/21 1151

## 2021-12-29 LAB — CULTURE, GROUP A STREP (THRC)

## 2022-01-03 ENCOUNTER — Other Ambulatory Visit: Payer: Self-pay | Admitting: Cardiovascular Disease

## 2022-01-05 ENCOUNTER — Other Ambulatory Visit: Payer: Self-pay

## 2022-01-05 DIAGNOSIS — M25531 Pain in right wrist: Secondary | ICD-10-CM | POA: Diagnosis not present

## 2022-01-05 MED ORDER — PRASUGREL HCL 10 MG PO TABS: ORAL_TABLET | ORAL | 0 refills | Status: DC

## 2022-01-09 ENCOUNTER — Emergency Department (HOSPITAL_COMMUNITY): Payer: Medicare Other

## 2022-01-09 ENCOUNTER — Ambulatory Visit (HOSPITAL_COMMUNITY)
Admission: EM | Admit: 2022-01-09 | Discharge: 2022-01-09 | Disposition: A | Discharge status: Home / Self Care | Payer: Medicare Other | Attending: Student | Admitting: Student

## 2022-01-09 ENCOUNTER — Emergency Department (HOSPITAL_COMMUNITY)
Admission: EM | Admit: 2022-01-09 | Discharge: 2022-01-09 | Disposition: A | Discharge status: Home / Self Care | Payer: Medicare Other | Attending: Student | Admitting: Student

## 2022-01-09 ENCOUNTER — Other Ambulatory Visit: Payer: Self-pay

## 2022-01-09 ENCOUNTER — Encounter (HOSPITAL_COMMUNITY): Payer: Self-pay | Admitting: *Deleted

## 2022-01-09 DIAGNOSIS — B20 Human immunodeficiency virus [HIV] disease: Secondary | ICD-10-CM

## 2022-01-09 DIAGNOSIS — E871 Hypo-osmolality and hyponatremia: Secondary | ICD-10-CM

## 2022-01-09 DIAGNOSIS — Z5321 Procedure and treatment not carried out due to patient leaving prior to being seen by health care provider: Secondary | ICD-10-CM | POA: Diagnosis not present

## 2022-01-09 DIAGNOSIS — M79642 Pain in left hand: Secondary | ICD-10-CM | POA: Diagnosis not present

## 2022-01-09 DIAGNOSIS — M79641 Pain in right hand: Secondary | ICD-10-CM | POA: Diagnosis not present

## 2022-01-09 DIAGNOSIS — R0789 Other chest pain: Secondary | ICD-10-CM | POA: Diagnosis not present

## 2022-01-09 DIAGNOSIS — R079 Chest pain, unspecified: Secondary | ICD-10-CM | POA: Diagnosis not present

## 2022-01-09 DIAGNOSIS — M25531 Pain in right wrist: Secondary | ICD-10-CM

## 2022-01-09 DIAGNOSIS — R070 Pain in throat: Secondary | ICD-10-CM | POA: Insufficient documentation

## 2022-01-09 DIAGNOSIS — Z20822 Contact with and (suspected) exposure to covid-19: Secondary | ICD-10-CM | POA: Insufficient documentation

## 2022-01-09 DIAGNOSIS — J439 Emphysema, unspecified: Secondary | ICD-10-CM | POA: Diagnosis not present

## 2022-01-09 DIAGNOSIS — R059 Cough, unspecified: Secondary | ICD-10-CM | POA: Insufficient documentation

## 2022-01-09 DIAGNOSIS — R490 Dysphonia: Secondary | ICD-10-CM

## 2022-01-09 LAB — CBC WITH DIFFERENTIAL/PLATELET
Abs Immature Granulocytes: 0.04 10*3/uL (ref 0.00–0.07)
Basophils Absolute: 0.1 10*3/uL (ref 0.0–0.1)
Basophils Relative: 0 %
Eosinophils Absolute: 0.1 10*3/uL (ref 0.0–0.5)
Eosinophils Relative: 1 %
HCT: 37.3 % — ABNORMAL LOW (ref 39.0–52.0)
Hemoglobin: 13.8 g/dL (ref 13.0–17.0)
Immature Granulocytes: 0 %
Lymphocytes Relative: 29 %
Lymphs Abs: 3.3 10*3/uL (ref 0.7–4.0)
MCH: 33.5 pg (ref 26.0–34.0)
MCHC: 37 g/dL — ABNORMAL HIGH (ref 30.0–36.0)
MCV: 90.5 fL (ref 80.0–100.0)
Monocytes Absolute: 0.9 10*3/uL (ref 0.1–1.0)
Monocytes Relative: 8 %
Neutro Abs: 7 10*3/uL (ref 1.7–7.7)
Neutrophils Relative %: 62 %
Platelets: 235 10*3/uL (ref 150–400)
RBC: 4.12 MIL/uL — ABNORMAL LOW (ref 4.22–5.81)
RDW: 14.4 % (ref 11.5–15.5)
WBC: 11.5 10*3/uL — ABNORMAL HIGH (ref 4.0–10.5)
nRBC: 0 % (ref 0.0–0.2)

## 2022-01-09 LAB — COMPREHENSIVE METABOLIC PANEL
ALT: 17 U/L (ref 0–44)
AST: 24 U/L (ref 15–41)
Albumin: 3.2 g/dL — ABNORMAL LOW (ref 3.5–5.0)
Alkaline Phosphatase: 71 U/L (ref 38–126)
Anion gap: 4 — ABNORMAL LOW (ref 5–15)
BUN: 17 mg/dL (ref 6–20)
CO2: 22 mmol/L (ref 22–32)
Calcium: 8.1 mg/dL — ABNORMAL LOW (ref 8.9–10.3)
Chloride: 104 mmol/L (ref 98–111)
Creatinine, Ser: 1.13 mg/dL (ref 0.61–1.24)
GFR, Estimated: >60 mL/min (ref >60)
Glucose, Bld: 155 mg/dL — ABNORMAL HIGH (ref 70–99)
Potassium: 3.5 mmol/L (ref 3.5–5.1)
Sodium: 130 mmol/L — ABNORMAL LOW (ref 135–145)
Total Bilirubin: 0.5 mg/dL (ref 0.3–1.2)
Total Protein: 7.3 g/dL (ref 6.5–8.1)

## 2022-01-09 LAB — RESP PANEL BY RT-PCR (FLU A&B, COVID) ARPGX2
Influenza A by PCR: NEGATIVE
Influenza B by PCR: NEGATIVE
SARS Coronavirus 2 by RT PCR: NEGATIVE

## 2022-01-09 MED ORDER — TIZANIDINE HCL 2 MG PO TABS: 2.0000 mg | ORAL_TABLET | Freq: Three times a day (TID) | ORAL | 0 refills | Status: DC | PRN

## 2022-01-09 MED ORDER — IBUPROFEN 400 MG PO TABS
600.0000 mg | ORAL_TABLET | Freq: Once | ORAL | Status: AC
  Administered 2022-01-09: 600 mg via ORAL
  Filled 2022-01-09: qty 1

## 2022-01-09 NOTE — Discharge Instructions (Addendum)
-  Follow-up with ortho- information below. Continue wrist brace, rest.  -Follow-up with ear, nose, throat doctor for hoarse voice - information below.  -You are low in sodium. Eat salty foods and follow-up with primary care in 2 weeks for recheck/ labs.  -Start the muscle relaxer-Zanaflex (tizanidine), up to 3 times daily for muscle spasms and pain.  This can make you drowsy, so take at bedtime or when you do not need to drive or operate machinery. -If chest pain at rest, dizziness, shortness of breath - head to the ED.

## 2022-01-09 NOTE — ED Triage Notes (Signed)
Pt reports on going Rt hand pain . Pt reports his RT hand feels broken. All the bones in his body are weakened. Pt reports he the meds from last visit  have not worked. Pt left the ED now to come to [facility] to finfd out why he hurts and why he is still horase.

## 2022-01-09 NOTE — ED Triage Notes (Signed)
Pt presents to ED with c/o generalized joint pain and body aches, pain to bilateral hands, pain to rt wrist, recurrent thrush, chest pain and pharyngitis. Reports pharyngitis has been ongoing x3 weeks and has been treated  by PCP for same but is concerned for new onset hoarseness.

## 2022-01-09 NOTE — ED Notes (Signed)
Patient stated he was leaving. That he was going to urgent care. That he didn't want to wait here any longer that he had to work today.

## 2022-01-09 NOTE — ED Provider Triage Note (Signed)
Emergency Medicine Provider Triage Evaluation Note  Rodney Walker , a 41 y.o. male  was evaluated in triage.  Pt complains of hoarse voice, mouth/throat pain, cough, polyarthralgias.  Polyarthralgias are new in the past few days.  He has been taking Tylenol without improvement.  Patient has had recurring issues with mouth/throat pain, diagnosed with thrush multiple times.  Symptoms tend to improve when he is taking nystatin, and then worsened when he stops.  Additionally, he has had issues with a hoarse voice for about a month.  H/o HIV. On medication  Review of Systems  Positive: Cough, polyarthralgias, mouth/throat pain, hoarse voice  Negative: fever  Physical Exam  BP 120/75 (BP Location: Right Arm)    Pulse 95    Temp 99.6 F (37.6 C) (Oral)    Resp 16    Ht 5\' 4"  (1.626 m)    Wt 61.2 kg    SpO2 96%    BMI 23.17 kg/m  Gen:   Awake, no distress   Resp:  Normal effort. Clear lung sounds MSK:   Moves extremities without difficulty  Other:  OP erythematous.  Posterior aspect of the tongue with mild/minimal white film.  Medical Decision Making  Medically screening exam initiated at 4:31 AM.  Appropriate orders placed.  Rodney Walker was informed that the remainder of the evaluation will be completed by another provider, this initial triage assessment does not replace that evaluation, and the importance of remaining in the ED until their evaluation is complete.  Labs, cxr, resp panel   , PA-C 01/09/22 [phone]

## 2022-01-09 NOTE — ED Provider Notes (Signed)
[facility]    CSN: [id number] Arrival date & time: 01/09/22  1005      History   Chief Complaint Chief Complaint  Patient presents with   Generalized Body Aches   Hand Pain    RT    HPI Rodney Walker is a 41 y.o. male presenting with hoarse voice and R wrist pain.  Medical history HIV, MI, CAD, COP, former smoker, marijuana abuse.  It appears he initially went to the emergency department and left without being seen earlier today, labs were drawn today including respiratory panel which was negative for flu and COVID; CBC with leukocytosis; hyponatremia at 130. We last saw him 12/27/21, per their note "Last check on his CD4 T-cell counts from 10/30/2021 were 764 and HIV viral load of 57.  He is also had 1 week history of persistent right wrist pain, decreased range of motion.  No fall, trauma, swelling, bruising, weakness, numbness or tingling.  No history of musculoskeletal disorders." No new trauma or overuse since that time. He is right handed. Completed the diflucan as directed for oropharyngeal candidiasis; states he is still hoarse. No trouble swallowing. 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 Also describes pain "everywhere", no recent trauma or falls. No cough, congestion.  HPI  Past Medical History:  Diagnosis Date   Anemia    COPD (chronic obstructive pulmonary disease) ([place])    Coronary artery disease    MI 2010 - dissection/plaque rupture in LAD tx with antiplt/antithrombotics >> relook cath with resolution (no PCI performed) // Inf STEMI 3/19: OM1 30; pRCA 100 >> DES // Echo 3/19:  mild LVH, EF 40-45, inf/inf-sept HK, trivial MR   Eczema    History of blood clots    History of myocardial infarction 2010; 2019   History of pleural empyema    HIV infection ([place])    Hyperlipidemia    Hypertension    Immune deficiency disorder ([place])    Ischemic cardiomyopathy 04/05/2018   Moderate persistent asthma without complication 17/06/9389    Myocardial infarction ([place])    2009, 2019   Pneumonia     Patient Active Problem List   Diagnosis Date Noted   Chest pain 09/30/2020   Hemorrhoid 09/30/2020   Avascular necrosis of bone of right hip ([place]) 09/25/2019   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   Hyperlipidemia 04/05/2018   Ischemic cardiomyopathy 04/05/2018   History of acute inferior wall MI 03/24/2018   Marijuana abuse, continuous 10/29/2016   Moderate persistent asthma without complication 30/08/2329   Allergic rhinitis 10/08/2016   Anemia 05/03/2015   Lung abscess ([place]) 04/22/2015   Bullous emphysema ([place])    Lobar pneumonia ([place])    Empyema lung ([place]) 04/16/2015   HIV (human immunodeficiency virus infection) ([place])    Tobacco abuse 03/21/2015   Lung mass 07/62/2633   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 ANTERIOR HYPOKINESIS   CORONARY STENT INTERVENTION N/A 03/24/2018   Procedure: CORONARY STENT INTERVENTION;  Surgeon: Sherren Mocha, MD;  Location: [facility];  Service: Cardiovascular;  Laterality: N/A;   CORONARY/GRAFT ACUTE MI REVASCULARIZATION N/A 03/24/2018   Procedure: Coronary/Graft Acute MI Revascularization;  Surgeon:  Sherren Mocha, MD;  Location: [facility];  Service: Cardiovascular;  Laterality: N/A;   LEFT HEART CATH AND CORONARY ANGIOGRAPHY N/A 03/24/2018   Procedure: LEFT HEART CATH AND CORONARY ANGIOGRAPHY;  Surgeon: Sherren Mocha, MD;  Location: [facility];  Service: Cardiovascular;  Laterality: N/A;   TOTAL HIP ARTHROPLASTY Right 09/25/2019   Procedure: RIGHT TOTAL HIP ARTHROPLASTY ANTERIOR APPROACH;  Surgeon: Leandrew Koyanagi, MD;  Location: [facility];  Service: Orthopedics;   Laterality: Right;   US ECHOCARDIOGRAPHY  12/09/2009   EF 55-60%   VIDEO ASSISTED THORACOSCOPY (VATS)/ LOBECTOMY Right 04/23/2015   Procedure: VIDEO ASSISTED THORACOSCOPY (VATS)/RIGHT upper  and right middle LOBECTOMY;  Surgeon: Ivin Poot, MD;  Location: [facility];  Service: Thoracic;  Laterality: Right;   VIDEO BRONCHOSCOPY Bilateral 09/05/2014   Procedure: VIDEO BRONCHOSCOPY WITH FLUORO;  Surgeon: Wilhelmina Mcardle, MD;  Location: [facility]COPY;  Service: Cardiopulmonary;  Laterality: Bilateral;   VIDEO BRONCHOSCOPY Bilateral 04/10/2015   Procedure: VIDEO BRONCHOSCOPY WITH FLUORO;  Surgeon: Juanito Doom, MD;  Location: [facility] ENDOSCOPY;  Service: Cardiopulmonary;  Laterality: Bilateral;       Home Medications    Prior to Admission medications   Medication Sig Start Date End Date Taking? Authorizing Provider  tiZANidine (ZANAFLEX) 2 MG tablet Take 1 tablet (2 mg total) by mouth every 8 (eight) hours as needed for muscle spasms. 01/09/22  Yes Hazel Sams, PA-C  albuterol (VENTOLIN HFA) 108 (90 Base) MCG/ACT inhaler Inhale 2 puffs into the lungs every 6 (six) hours as needed for wheezing or shortness of breath. 09/19/20   [place] Callas, NP  aspirin EC 81 MG tablet Take 1 tablet (81 mg total) by mouth 2 (two) times daily. 09/25/19   Leandrew Koyanagi, MD  bictegravir-emtricitabine-tenofovir AF (BIKTARVY) 50-200-25 MG TABS tablet Take 1 tablet by mouth daily. 02/12/21   Carlyle Basques, MD  cetirizine (ZYRTEC) 10 MG tablet TAKE 1 TABLET(10 MG) BY MOUTH DAILY 05/30/21   Carlyle Basques, MD  Cholecalciferol (VITAMIN D3) 125 MCG (5000 UT) CAPS Take 1 capsule (5,000 Units total) by mouth daily. 09/24/20   [place] Callas, NP  darunavir-cobicistat (PREZCOBIX) 800-150 MG tablet TAKE 1 TABLET BY MOUTH DAILY WITH BREAKFAST. SWALLOW WHOLE. DO NOT CRUSH, BREAK OR CHEW TABLETS.TAKE WITH FOOD 02/12/21   Carlyle Basques, MD  fluconazole (DIFLUCAN) 150 MG tablet Day 1: Take 2 tablets. Day 2-7: Take 1 tablet  daily. 12/27/21   Jaynee Eagles, PA-C  flunisolide (NASALIDE) 25 MCG/ACT (0.025%) SOLN Place 2 sprays into the nose 2 (two) times daily. 11/13/21   Carlyle Basques, MD  metoprolol succinate (TOPROL-XL) 25 MG 24 hr tablet Take 1 tablet (25 mg total) by mouth daily. Please call to schedule appointment for future refills. 12/26/21   [name], Wonda Cheng, MD  nitroGLYCERIN (NITROSTAT) 0.4 MG SL tablet Place 1 tablet (0.4 mg total) under the tongue every 5 (five) minutes x 3 doses as needed for chest pain. 06/27/20   [name], Wonda Cheng, MD  prasugrel (EFFIENT) 10 MG TABS tablet TAKE 1 TABLET(10 MG) BY MOUTH DAILY. Please make overdue appt with Dr. Acie Fredrickson before anymore refills. [phone]. Thank you 2nd attempt 01/05/22   [name], Wonda Cheng, MD  predniSONE (DELTASONE) 20 MG tablet Take 2 tablets daily with breakfast. 12/27/21   Jaynee Eagles, PA-C  rosuvastatin (CRESTOR) 10 MG tablet TAKE 1 TABLET(10 MG) BY MOUTH DAILY 11/21/21   Horald Pollen, MD  Sodium Sulfate-Mag Sulfate-KCl (SUTAB) [phone] MG TABS Take 1 kit  by mouth as directed. MANUFACTURER CODES!! BIN: [id number] PCN: CN GROUP: [id number] MEMBER ID: [id number];[employer] AS SECONDARY INSURANCE ;NO PRIOR AUTHORIZATION 12/04/20   Willia Craze, NP    Family History Family History  Problem Relation Age of Onset   Diabetes Mother    Eczema Mother    Asthma Mother    COPD Mother    Hypertension Mother    Hyperlipidemia Mother    Stroke Mother    Heart disease Mother    Sleep apnea Mother    Liver cancer Father    Asthma Brother    Diabetes Maternal Grandmother    Heart failure Maternal Grandmother     Social History Social History   Tobacco Use   Smoking status: Former    Packs/day: 0.25    Years: 17.00    Pack years: 4.25    Types: Cigarettes, Cigars    Quit date: 03/23/2015    Years since quitting: 6.8   Smokeless tobacco: Never  Vaping Use   Vaping Use: Never used  Substance Use Topics   Alcohol use: No    Alcohol/week: 0.0  standard drinks   Drug use: Not Currently    Frequency: 7.0 times per week    Types: Marijuana     Allergies   Patient has no known allergies.   Review of Systems Review of Systems  HENT:  Positive for voice change.   Musculoskeletal:        R hand pain     Physical Exam Triage Vital Signs ED Triage Vitals  Enc Vitals Group     BP      Pulse      Resp      Temp      Temp src      SpO2      Weight      Height      Head Circumference      Peak Flow      Pain Score      Pain Loc      Pain Edu?      Excl. in [place]?    No data found.  Updated Vital Signs BP 106/70    Pulse 62    Temp 98.5 F (36.9 C)    Resp 20    SpO2 100%   Visual Acuity Right Eye Distance:   Left Eye Distance:   Bilateral Distance:    Right Eye Near:   Left Eye Near:    Bilateral Near:     Physical Exam Vitals reviewed.  Constitutional:      General: He is not in acute distress.    Appearance: Normal appearance. He is not ill-appearing or diaphoretic.  HENT:     Head: Normocephalic and atraumatic.     Mouth/Throat:     Pharynx: Uvula midline. No posterior oropharyngeal erythema.     Tonsils: No tonsillar exudate.     Comments: No white plaques or exudate. Airway is patent. Tonsils are minute and barely visible. Cardiovascular:     Rate and Rhythm: Normal rate and regular rhythm.     Heart sounds: Normal heart sounds.  Pulmonary:     Effort: Pulmonary effort is normal.     Breath sounds: Normal breath sounds.  Chest:     Chest wall: Tenderness present.     Comments: Sternum TTP  Musculoskeletal:     Comments: R hand - no skin changes or effusion. Minimally tender to palpation over thenar pad. No snuffbox tenderness. Positive finkelstein.  Grip strength 5/5, sensation intact, cap refill <2 seconds.   Skin:    General: Skin is warm.  Neurological:     General: No focal deficit present.     Mental Status: He is alert and oriented to person, place, and time.  Psychiatric:         Mood and Affect: Mood normal.        Behavior: Behavior normal.        Thought Content: Thought content normal.        Judgment: Judgment normal.    UC Treatments / Results  Labs (all labs ordered are listed, but only abnormal results are displayed) Labs Reviewed - No data to display   EKG   Radiology DG Chest 2 View  Result Date: 01/09/2022 CLINICAL DATA:  Cough and chest pain EXAM: CHEST - 2 VIEW COMPARISON:  09/18/2020 FINDINGS: Chronic volume loss, surgical change, and hazy density in the right chest. Hyperinflation with left diaphragm flattening and indistinctness on the frontal view. Emphysema. Normal heart size and mediastinal contours. IMPRESSION: Emphysema and postoperative right chest. No acute finding when compared to prior. Electronically Signed   By: Jorje Guild M.D.   On: 01/09/2022 05:26    Procedures Procedures (including critical care time)  Medications Ordered in UC Medications - No data to display  Initial Impression / Assessment and Plan / UC Course  I have reviewed the triage vital signs and the nursing notes.  Pertinent labs & imaging results that were available during my care of the patient were reviewed by me and considered in my medical decision making (see chart for details).     This patient is a very pleasant 41 y.o. year old male presenting with follow-up for multiple conditions: R wrist pain, hoarse voice, chest wall pain, hyponatremia. Followed by infectious disease for HIV; detectable 10/2021.  He initially went to the emergency department today and left without being seen, labs were drawn today including respiratory panel which was negative for flu and COVID; CBC with leukocytosis; hyponatremia at 130.  Suspect this could be contributing to his diffuse musculoskeletal pain.  Recommended eating salty foods and follow-up with PCP in 2 weeks for recheck.  For R wrist pain - xray was negative 12/31, and he has completed prednisone as directed  without relief.  Has not followed up with Ortho as directed.  Continue wearing wrist brace, and follow-up with Ortho; information again provided.  Completed the diflucan as directed 12/31 for oropharyngeal candidiasis; states he is still hoarse.  There is no oropharyngeal candidiasis on exam today.  Follow-up with ear nose and throat, DDx does include vocal cord polyp or similar.  Chest wall pain is reproducible, trial of Zanaflex as below.  ED return precautions discussed. Patient verbalizes understanding and agreement.   Level 4 for acute complicated disease and prescription drug management.  Final Clinical Impressions(s) / UC Diagnoses   Final diagnoses:  Hyponatremia  HIV disease ([place])  Hoarse voice quality  Right wrist pain  Chest wall pain     Discharge Instructions      -Follow-up with ortho- information below. Continue wrist brace, rest.  -Follow-up with ear, nose, throat doctor for hoarse voice - information below.  -You are low in sodium. Eat salty foods and follow-up with primary care in 2 weeks for recheck/ labs.  -Start the muscle relaxer-Zanaflex (tizanidine), up to 3 times daily for muscle spasms and pain.  This can make you drowsy, so take at bedtime or when you  do not need to drive or operate machinery. -If chest pain at rest, dizziness, shortness of breath - head to the ED.     ED Prescriptions     Medication Sig Dispense Auth. Provider   tiZANidine (ZANAFLEX) 2 MG tablet Take 1 tablet (2 mg total) by mouth every 8 (eight) hours as needed for muscle spasms. 21 tablet Hazel Sams, PA-C      PDMP not reviewed this encounter.   Hazel Sams, PA-C 01/09/22 1126

## 2022-01-12 DIAGNOSIS — M25532 Pain in left wrist: Secondary | ICD-10-CM | POA: Diagnosis not present

## 2022-01-12 DIAGNOSIS — M25531 Pain in right wrist: Secondary | ICD-10-CM | POA: Diagnosis not present

## 2022-01-20 ENCOUNTER — Other Ambulatory Visit: Payer: Self-pay

## 2022-01-20 ENCOUNTER — Other Ambulatory Visit: Payer: Self-pay | Admitting: Cardiovascular Disease

## 2022-01-20 ENCOUNTER — Encounter: Payer: Self-pay | Admitting: Emergency Medicine

## 2022-01-20 ENCOUNTER — Ambulatory Visit (INDEPENDENT_AMBULATORY_CARE_PROVIDER_SITE_OTHER): Payer: Medicare Other | Admitting: Emergency Medicine

## 2022-01-20 VITALS — BP 102/62 | HR 94 | Temp 98.7°F | Ht 64.0 in | Wt 131.0 lb

## 2022-01-20 DIAGNOSIS — J370 Chronic laryngitis: Secondary | ICD-10-CM

## 2022-01-20 MED ORDER — METHYLPREDNISOLONE 4 MG PO TBPK: ORAL_TABLET | ORAL | 1 refills | Status: DC

## 2022-01-20 NOTE — Patient Instructions (Signed)
Laryngitis ?Laryngitis is irritation and swelling (inflammation) of your vocal cords. It causes your voice to sound hoarse and may cause you to lose your voice. ?Depending on the cause, this condition may go away after a short time or may last for more than 3 weeks. Treatment often involves resting your voice and using medicines to soothe your throat. ?What are the causes? ?Laryngitis that lasts for a short time may be caused by: ?An infection caused by a virus. ?Lots of talking, yelling, or singing. This is also called vocal strain. ?An infection caused by bacteria. ?Laryngitis that lasts for more than 3 weeks can be caused by: ?Lots of talking, yelling, or singing. ?An injury to the vocal cords. ?Acid reflux. ?Allergies. ?Sinus infection. ?Mucus draining from the nose down the throat (postnasal drip). ?Smoking. ?Drinking too much alcohol. ?Breathing in chemicals or dust. ?Having growths on the vocal cords. ?What increases the risk? ?Smoking. ?Drinking too much alcohol. ?Having allergies. ?Breathing in fumes at work. ?What are the signs or symptoms? ?A change in your voice. It may sound low and hoarse. ?Loss of voice. ?Dry cough. ?Sore throat. ?Dry throat. ?Stuffy nose. ?How is this treated? ?Treatment depends on what is causing the laryngitis. Usually, treatment includes: ?Resting your voice. ?Using medicines to soothe your throat. ?If your laryngitis is caused by an infection from bacteria, you may need to take antibiotics. ?If your laryngitis is caused by a growth on your vocal cords, you may need to have a surgery to remove it. ?Follow these instructions at home: ?Medicines ?Take over-the-counter and prescription medicines only as told by your doctor. ?If you were prescribed an antibiotic medicine, take it as told by your doctor. Do not stop taking it even if you start to feel better. ?Use throat lozenges or sprays to soothe your throat as told by your doctor. ?General instructions ? ?Talk as little as  possible. To do this: ?Avoid whispering. ?Write instead of talking. Do this until your voice is back to normal. ?Rinse your mouth (gargle) with a salt water mixture 3-4 times a day or as needed. ?To make salt water, dissolve ?-1 tsp (3-6 g) of salt in 1 cup (237 mL) of warm water. ?Do not swallow this mixture. ?Drink enough fluid to keep your pee (urine) pale yellow. ?Breathe in moist air. Use a humidifier if you live in a dry climate. ?Do not smoke or use any products that contain nicotine or tobacco. If you need help quitting, ask your doctor. ?Contact a doctor if: ?You have a fever. ?Your pain is worse. ?Your symptoms do not get better in 2 weeks. ?Get help right away if: ?You cough up blood. ?You have trouble swallowing. ?You have trouble breathing. ?Summary ?Laryngitis is inflammation of your vocal cords. ?This condition causes your voice to sound low and hoarse. ?Rest your voice by talking as little as possible. Also avoid whispering. ?Get help right away if you have trouble swallowing or breathing or if you cough up blood. ?This information is not intended to replace advice given to you by your health care provider. Make sure you discuss any questions you have with your health care provider. ?Document Revised: 03/03/2021 Document Reviewed: 03/03/2021 ?Elsevier Patient Education ? 2022 [employer] ? ?

## 2022-01-20 NOTE — Progress Notes (Signed)
Rodney Walker 41 y.o.   Chief Complaint  Patient presents with   Laryngitis    Pt states that his voice has been    HISTORY OF PRESENT ILLNESS: This is a 41 y.o. male complaining of laryngitis for the past couple months. Sings almost every day.  Unable to do so. History of HIV.  Denies fever or chills.  Denies difficulty swallowing. Denies difficulty breathing or chest pain. Patient has multiple chronic medical problems: 1.  Positive HIV since 2015, on medications.  Sees Dr. Graylon Good, infectious disease specialist, on a regular basis. 2.  History of COPD/emphysema.  Ex smoker.  Also has history of right-sided lung surgery.  Has occasional dry cough. 3.  History of coronary artery disease status post 2 MIs, last one 3 years ago.  Had one stent placed in.  Presently on metoprolol succinate 25 mg daily on daily baby aspirin.  No need for nitroglycerin.  Also takes prasugrel 10 mg daily.  However he does not see cardiologist on a regular basis. 4.  History of right hip surgery presumably secondary to avascular necrosis. 5.  History of hemorrhoids.  Wrist Pain  Pertinent negatives include no fever.    Prior to Admission medications   Medication Sig Start Date End Date Taking? Authorizing Provider  albuterol (VENTOLIN HFA) 108 (90 Base) MCG/ACT inhaler Inhale 2 puffs into the lungs every 6 (six) hours as needed for wheezing or shortness of breath. 09/19/20   Kress Callas, NP  aspirin EC 81 MG tablet Take 1 tablet (81 mg total) by mouth 2 (two) times daily. 09/25/19   Leandrew Koyanagi, MD  bictegravir-emtricitabine-tenofovir AF (BIKTARVY) 50-200-25 MG TABS tablet Take 1 tablet by mouth daily. 02/12/21   Carlyle Basques, MD  cetirizine (ZYRTEC) 10 MG tablet TAKE 1 TABLET(10 MG) BY MOUTH DAILY 05/30/21   Carlyle Basques, MD  Cholecalciferol (VITAMIN D3) 125 MCG (5000 UT) CAPS Take 1 capsule (5,000 Units total) by mouth daily. 09/24/20   Baileyton Callas, NP  darunavir-cobicistat  (PREZCOBIX) 800-150 MG tablet TAKE 1 TABLET BY MOUTH DAILY WITH BREAKFAST. SWALLOW WHOLE. DO NOT CRUSH, BREAK OR CHEW TABLETS.TAKE WITH FOOD 02/12/21   Carlyle Basques, MD  fluconazole (DIFLUCAN) 150 MG tablet Day 1: Take 2 tablets. Day 2-7: Take 1 tablet daily. 12/27/21   Jaynee Eagles, PA-C  flunisolide (NASALIDE) 25 MCG/ACT (0.025%) SOLN Place 2 sprays into the nose 2 (two) times daily. 11/13/21   Carlyle Basques, MD  metoprolol succinate (TOPROL-XL) 25 MG 24 hr tablet Take 1 tablet (25 mg total) by mouth daily. Please schedule appointment for future request. 1st attempt 01/20/22   [name], Wonda Cheng, MD  nitroGLYCERIN (NITROSTAT) 0.4 MG SL tablet Place 1 tablet (0.4 mg total) under the tongue every 5 (five) minutes x 3 doses as needed for chest pain. 06/27/20   [name], Wonda Cheng, MD  prasugrel (EFFIENT) 10 MG TABS tablet Take 1 tablet (10 mg total) by mouth daily. PLEASE MAKE APPT WITH [name] BEFORE ANY [phone]..3rd & FINAL ATTEMPT 01/20/22   [name], Wonda Cheng, MD  predniSONE (DELTASONE) 20 MG tablet Take 2 tablets daily with breakfast. 12/27/21   Jaynee Eagles, PA-C  rosuvastatin (CRESTOR) 10 MG tablet TAKE 1 TABLET(10 MG) BY MOUTH DAILY 11/21/21   Horald Pollen, MD  Sodium Sulfate-Mag Sulfate-KCl (SUTAB) [phone] MG TABS Take 1 kit by mouth as directed. MANUFACTURER CODES!! BIN: [id number] PCN: CN GROUP: [id number] MEMBER ID: [id number];FRT AS SECONDARY INSURANCE ;NO PRIOR AUTHORIZATION 12/04/20   Willia Craze,  NP  tiZANidine (ZANAFLEX) 2 MG tablet Take 1 tablet (2 mg total) by mouth every 8 (eight) hours as needed for muscle spasms. 01/09/22   Hazel Sams, PA-C    No Known Allergies  Patient Active Problem List   Diagnosis Date Noted   Chest pain 09/30/2020   Hemorrhoid 09/30/2020   Avascular necrosis of bone of right hip ([place]) 09/25/2019   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   Hyperlipidemia 04/05/2018   Ischemic cardiomyopathy 04/05/2018   History of  acute inferior wall MI 03/24/2018   Marijuana abuse, continuous 10/29/2016   Moderate persistent asthma without complication 15/40/0867   Allergic rhinitis 10/08/2016   Anemia 05/03/2015   Lung abscess ([place]) 04/22/2015   Bullous emphysema ([facility])    Lobar pneumonia ([place])    Empyema lung ([place]) 04/16/2015   HIV (human immunodeficiency virus infection) ([place])    Tobacco abuse 03/21/2015   Lung mass 61/95/0932   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   Myalgia 04/23/2014   AIDS ([place]) 03/22/2014   CAP (community acquired pneumonia) 03/22/2014   CAD (coronary artery disease) 11/13/2011    Past Medical History:  Diagnosis Date   Anemia    COPD (chronic obstructive pulmonary disease) ([place])    Coronary artery disease    MI 2010 - dissection/plaque rupture in LAD tx with antiplt/antithrombotics >> relook cath with resolution (no PCI performed) // Inf STEMI 3/19: OM1 30; pRCA 100 >> DES // Echo 3/19:  mild LVH, EF 40-45, inf/inf-sept HK, trivial MR   Eczema    History of blood clots    History of myocardial infarction 2010; 2019   History of pleural empyema    HIV infection ([place])    Hyperlipidemia    Hypertension    Immune deficiency disorder ([place])    Ischemic cardiomyopathy 04/05/2018   Moderate persistent asthma without complication 99/07/3381   Myocardial infarction ([place])    2009, 2019   Pneumonia     Past Surgical History:  Procedure Laterality Date   CARDIAC CATHETERIZATION  06/24/2009   EF 60%   CARDIAC CATHETERIZATION  06/17/2009   EF 45%. ANTERIOR HYPOKINESIS   CORONARY STENT INTERVENTION N/A 03/24/2018   Procedure: CORONARY STENT INTERVENTION;  Surgeon: Sherren Mocha, MD;  Location: [facility];  Service: Cardiovascular;  Laterality: N/A;   CORONARY/GRAFT ACUTE MI REVASCULARIZATION N/A 03/24/2018   Procedure: Coronary/Graft Acute MI Revascularization;  Surgeon: Sherren Mocha, MD;  Location: [facility];  Service: Cardiovascular;  Laterality: N/A;   LEFT HEART  CATH AND CORONARY ANGIOGRAPHY N/A 03/24/2018   Procedure: LEFT HEART CATH AND CORONARY ANGIOGRAPHY;  Surgeon: Sherren Mocha, MD;  Location: [facility];  Service: Cardiovascular;  Laterality: N/A;   TOTAL HIP ARTHROPLASTY Right 09/25/2019   Procedure: RIGHT TOTAL HIP ARTHROPLASTY ANTERIOR APPROACH;  Surgeon: Leandrew Koyanagi, MD;  Location: [facility];  Service: Orthopedics;  Laterality: Right;   US ECHOCARDIOGRAPHY  12/09/2009   EF 55-60%   VIDEO ASSISTED THORACOSCOPY (VATS)/ LOBECTOMY Right 04/23/2015   Procedure: VIDEO ASSISTED THORACOSCOPY (VATS)/RIGHT upper  and right middle LOBECTOMY;  Surgeon: Ivin Poot, MD;  Location: [facility];  Service: Thoracic;  Laterality: Right;   VIDEO BRONCHOSCOPY Bilateral 09/05/2014   Procedure: VIDEO BRONCHOSCOPY WITH FLUORO;  Surgeon: Wilhelmina Mcardle, MD;  Location: [facility];  Service: Cardiopulmonary;  Laterality: Bilateral;   VIDEO BRONCHOSCOPY Bilateral 04/10/2015   Procedure: VIDEO BRONCHOSCOPY WITH FLUORO;  Surgeon: Juanito Doom, MD;  Location[facility];  Service: Cardiopulmonary;  Laterality: Bilateral;    Social History   Socioeconomic History   Marital status: Single    Spouse name: Not on file   Number of children: 0   Years of education: Not on file   Highest education level: Not on file  Occupational History   Occupation: unemployed  Tobacco Use   Smoking status: Former    Packs/day: 0.25    Years: 17.00    Pack years: 4.25    Types: Cigarettes, Cigars    Quit date: 03/23/2015    Years since quitting: 6.8   Smokeless tobacco: Never  Vaping Use   Vaping Use: Never used  Substance and Sexual Activity   Alcohol use: No    Alcohol/week: 0.0 standard drinks   Drug use: Not Currently    Frequency: 7.0 times per week    Types: Marijuana   Sexual activity: Not Currently    Partners: Male    Comment: declined condoms  Other Topics Concern   Not on file  Social History Narrative   Not on file   Social Determinants of  Health   Financial Resource Strain: Low Risk    Difficulty of Paying Living Expenses: Not hard at all  Food Insecurity: No Food Insecurity   Worried About Charity fundraiser in the Last Year: Never true   [place] in the Last Year: Never true  Transportation Needs: No Transportation Needs   Lack of Transportation (Medical): No   Lack of Transportation (Non-Medical): No  Physical Activity: Sufficiently Active   Days of Exercise per Week: 5 days   Minutes of Exercise per Session: 30 min  Stress: Stress Concern Present   Feeling of Stress : To some extent  Social Connections: Moderately Integrated   Frequency of Communication with Friends and Family: More than three times a week   Frequency of Social Gatherings with Friends and Family: More than three times a week   Attends Religious Services: 1 to 4 times per year   Active Member of [employer] or Organizations: No   Attends Music therapist: 1 to 4 times per year   Marital Status: Never married  Human resources officer Violence: Not At Risk   Fear of Current or Ex-Partner: No   Emotionally Abused: No   Physically Abused: No   Sexually Abused: No    Family History  Problem Relation Age of Onset   Diabetes Mother    Eczema Mother    Asthma Mother    COPD Mother    Hypertension Mother    Hyperlipidemia Mother    Stroke Mother    Heart disease Mother    Sleep apnea Mother    Liver cancer Father    Asthma Brother    Diabetes Maternal Grandmother    Heart failure Maternal Grandmother      Review of Systems  Constitutional: Negative.  Negative for chills and fever.  HENT:  Positive for sore throat. Negative for congestion.   Respiratory: Negative.  Negative for cough and shortness of breath.   Cardiovascular: Negative.  Negative for chest pain and palpitations.  Gastrointestinal:  Negative for abdominal pain, diarrhea, nausea and vomiting.  Genitourinary: Negative.   Skin: Negative.  Negative for rash.   Neurological: Negative.  Negative for dizziness and headaches.  All other systems reviewed and are negative.  Today's Vitals   01/20/22 1616  BP: 102/62  Pulse: 94  Temp: 98.7 F (37.1 C)  TempSrc: Oral  SpO2: 93%  Weight: 131  lb (59.4 kg)  Height: _0  (1.626 m)   Body mass index is 22.49 kg/m.  Physical Exam Vitals reviewed.  Constitutional:      Appearance: Normal appearance.  HENT:     Head: Normocephalic.     Mouth/Throat:     Mouth: Mucous membranes are moist.     Pharynx: Oropharynx is clear. No oropharyngeal exudate or posterior oropharyngeal erythema.  Eyes:     Extraocular Movements: Extraocular movements intact.     Conjunctiva/sclera: Conjunctivae normal.     Pupils: Pupils are equal, round, and reactive to light.  Cardiovascular:     Rate and Rhythm: Normal rate and regular rhythm.     Pulses: Normal pulses.     Heart sounds: Normal heart sounds.  Pulmonary:     Effort: Pulmonary effort is normal.     Breath sounds: Normal breath sounds.  Musculoskeletal:     Cervical back: Normal range of motion and neck supple.  Skin:    General: Skin is warm and dry.     Capillary Refill: Capillary refill takes less than 2 seconds.  Neurological:     General: No focal deficit present.     Mental Status: He is alert and oriented to person, place, and time.  Psychiatric:        Mood and Affect: Mood normal.        Behavior: Behavior normal.     ASSESSMENT & PLAN: A total of 30 minutes was spent with the patient and counseling/coordination of care regarding preparing for this visit, review of most recent office visit note, review of all medications, differential diagnosis of chronic laryngitis, treatment of laryngitis with oral corticosteroids, need for ENT evaluation and laryngoscopy, prognosis, documentation and need for follow-up.  Problem List Items Addressed This Visit   None Visit Diagnoses     Chronic laryngitis    -  Primary   Relevant Medications    methylPREDNISolone (MEDROL DOSEPAK) 4 MG TBPK tablet   Other Relevant Orders   Ambulatory referral to ENT      Patient Instructions  Laryngitis Laryngitis is irritation and swelling (inflammation) of your vocal cords. It causes your voice to sound hoarse and may cause you to lose your voice. Depending on the cause, this condition may go away after a short time or may last for more than 3 weeks. Treatment often involves resting your voice and using medicines to soothe your throat. What are the causes? Laryngitis that lasts for a short time may be caused by: An infection caused by a virus. Lots of talking, yelling, or singing. This is also called vocal strain. An infection caused by bacteria. Laryngitis that lasts for more than 3 weeks can be caused by: Lots of talking, yelling, or singing. An injury to the vocal cords. Acid reflux. Allergies. Sinus infection. Mucus draining from the nose down the throat (postnasal drip). Smoking. Drinking too much alcohol. Breathing in chemicals or dust. Having growths on the vocal cords. What increases the risk? Smoking. Drinking too much alcohol. Having allergies. Breathing in fumes at work. What are the signs or symptoms? A change in your voice. It may sound low and hoarse. Loss of voice. Dry cough. Sore throat. Dry throat. Stuffy nose. How is this treated? Treatment depends on what is causing the laryngitis. Usually, treatment includes: Resting your voice. Using medicines to soothe your throat. If your laryngitis is caused by an infection from bacteria, you may need to take antibiotics. If your laryngitis is caused by  a growth on your vocal cords, you may need to have a surgery to remove it. Follow these instructions at home: Medicines Take over-the-counter and prescription medicines only as told by your doctor. If you were prescribed an antibiotic medicine, take it as told by your doctor. Do not stop taking it even if you start to  feel better. Use throat lozenges or sprays to soothe your throat as told by your doctor. General instructions  Talk as little as possible. To do this: Avoid whispering. Write instead of talking. Do this until your voice is back to normal. Rinse your mouth (gargle) with a salt water mixture 3-4 times a day or as needed. To make salt water, dissolve -1 tsp (3-6 g) of salt in 1 cup (237 mL) of warm water. Do not swallow this mixture. Drink enough fluid to keep your pee (urine) pale yellow. Breathe in moist air. Use a humidifier if you live in a dry climate. Do not smoke or use any products that contain nicotine or tobacco. If you need help quitting, ask your doctor. Contact a doctor if: You have a fever. Your pain is worse. Your symptoms do not get better in 2 weeks. Get help right away if: You cough up blood. You have trouble swallowing. You have trouble breathing. Summary Laryngitis is inflammation of your vocal cords. This condition causes your voice to sound low and hoarse. Rest your voice by talking as little as possible. Also avoid whispering. Get help right away if you have trouble swallowing or breathing or if you cough up blood. This information is not intended to replace advice given to you by your health care provider. Make sure you discuss any questions you have with your health care provider. Document Revised: 03/03/2021 Document Reviewed: 03/03/2021 Elsevier Patient Education  2022 [place], MD [facility]

## 2022-01-30 DIAGNOSIS — M1811 Unilateral primary osteoarthritis of first carpometacarpal joint, right hand: Secondary | ICD-10-CM | POA: Diagnosis not present

## 2022-02-02 ENCOUNTER — Other Ambulatory Visit: Payer: Self-pay

## 2022-02-02 DIAGNOSIS — Z113 Encounter for screening for infections with a predominantly sexual mode of transmission: Secondary | ICD-10-CM

## 2022-02-02 DIAGNOSIS — B20 Human immunodeficiency virus [HIV] disease: Secondary | ICD-10-CM

## 2022-02-04 ENCOUNTER — Other Ambulatory Visit: Payer: Medicare Other

## 2022-02-04 ENCOUNTER — Other Ambulatory Visit: Payer: Self-pay

## 2022-02-04 DIAGNOSIS — Z113 Encounter for screening for infections with a predominantly sexual mode of transmission: Secondary | ICD-10-CM

## 2022-02-04 DIAGNOSIS — B20 Human immunodeficiency virus [HIV] disease: Secondary | ICD-10-CM

## 2022-02-06 LAB — CBC WITH DIFFERENTIAL/PLATELET
Absolute Monocytes: 1447 cells/uL — ABNORMAL HIGH (ref 200–950)
Basophils Absolute: 16 cells/uL (ref 0–200)
Basophils Relative: 0.1 %
Eosinophils Absolute: 0 cells/uL — ABNORMAL LOW (ref 15–500)
Eosinophils Relative: 0 %
HCT: 39.7 % (ref 38.5–50.0)
Hemoglobin: 13.8 g/dL (ref 13.2–17.1)
Lymphs Abs: 2719 cells/uL (ref 850–3900)
MCH: 33.3 pg — ABNORMAL HIGH (ref 27.0–33.0)
MCHC: 34.8 g/dL (ref 32.0–36.0)
MCV: 95.9 fL (ref 80.0–100.0)
MPV: 9.8 fL (ref 7.5–12.5)
Monocytes Relative: 9.1 %
Neutro Abs: 11718 cells/uL — ABNORMAL HIGH (ref 1500–7800)
Neutrophils Relative %: 73.7 %
Platelets: 346 10*3/uL (ref 140–400)
RBC: 4.14 10*6/uL — ABNORMAL LOW (ref 4.20–5.80)
RDW: 14.5 % (ref 11.0–15.0)
Total Lymphocyte: 17.1 %
WBC: 15.9 10*3/uL — ABNORMAL HIGH (ref 3.8–10.8)

## 2022-02-06 LAB — COMPLETE METABOLIC PANEL WITH GFR
AG Ratio: 1.1 (calc) (ref 1.0–2.5)
ALT: 15 U/L (ref 9–46)
AST: 14 U/L (ref 10–40)
Albumin: 3.9 g/dL (ref 3.6–5.1)
Alkaline phosphatase (APISO): 75 U/L (ref 36–130)
BUN: 24 mg/dL (ref 7–25)
CO2: 28 mmol/L (ref 20–32)
Calcium: 9.2 mg/dL (ref 8.6–10.3)
Chloride: 103 mmol/L (ref 98–110)
Creat: 1.11 mg/dL (ref 0.60–1.29)
Globulin: 3.5 g/dL (calc) (ref 1.9–3.7)
Glucose, Bld: 100 mg/dL — ABNORMAL HIGH (ref 65–99)
Potassium: 4.2 mmol/L (ref 3.5–5.3)
Sodium: 136 mmol/L (ref 135–146)
Total Bilirubin: 0.3 mg/dL (ref 0.2–1.2)
Total Protein: 7.4 g/dL (ref 6.1–8.1)
eGFR: 86 mL/min/{1.73_m2} (ref >=60)

## 2022-02-06 LAB — RPR: RPR Ser Ql: REACTIVE — AB

## 2022-02-06 LAB — HIV-1 RNA QUANT-NO REFLEX-BLD
HIV 1 RNA Quant: 23 Copies/mL — ABNORMAL HIGH
HIV-1 RNA Quant, Log: 1.36 Log cps/mL — ABNORMAL HIGH

## 2022-02-06 LAB — T-HELPER CELLS (CD4) COUNT (NOT AT ARMC)
Absolute CD4: 551 cells/uL (ref 490–1740)
CD4 T Helper %: 21 % — ABNORMAL LOW (ref 30–61)
Total lymphocyte count: 2650 cells/uL (ref 850–3900)

## 2022-02-06 LAB — FLUORESCENT TREPONEMAL AB(FTA)-IGG-BLD: Fluorescent Treponemal ABS: REACTIVE — AB

## 2022-02-06 LAB — RPR TITER: RPR Titer: 1:256 {titer} — ABNORMAL HIGH

## 2022-02-10 ENCOUNTER — Telehealth: Payer: Self-pay

## 2022-02-10 NOTE — Telephone Encounter (Signed)
Per Dr.Snider called patient regarding RPR. Patient will need to come into office for Bicillin 2.4 million units x3. Called patient who is okay with scheduling appt. First dose is for 3/17 at 4.  Patient denies allergies to penicillin. Has been treated for this in the past. Understands that he must refrain from all sexual activity until until completely treated and an additional 10 days. Understands he must inform recent partners to get tested/ treated. Juanita Laster, RMA

## 2022-02-13 ENCOUNTER — Other Ambulatory Visit: Payer: Self-pay

## 2022-02-13 ENCOUNTER — Ambulatory Visit (INDEPENDENT_AMBULATORY_CARE_PROVIDER_SITE_OTHER): Payer: Medicare Other

## 2022-02-13 DIAGNOSIS — A539 Syphilis, unspecified: Secondary | ICD-10-CM | POA: Diagnosis not present

## 2022-02-13 MED ORDER — PENICILLIN G BENZATHINE 1200000 UNIT/2ML IM SUSY
1.2000 10*6.[IU] | PREFILLED_SYRINGE | Freq: Once | INTRAMUSCULAR | Status: AC
  Administered 2022-02-13: 1.2 10*6.[IU] via INTRAMUSCULAR

## 2022-02-13 NOTE — Progress Notes (Signed)
Reviewed and verified allergies with patient. Patient tolerated Bicillin injections well. Reinforced abstinence until treatment completed plus an additional 10 days, offered condoms and encouraged use. Advised patient to notify sexual partners for testing and treatment. Patient verbalized understanding.   Jovahn Breit D Brynlyn Dade, RN  

## 2022-02-19 ENCOUNTER — Ambulatory Visit (INDEPENDENT_AMBULATORY_CARE_PROVIDER_SITE_OTHER): Payer: Medicare Other | Admitting: Internal Medicine

## 2022-02-19 ENCOUNTER — Other Ambulatory Visit: Payer: Self-pay | Admitting: Cardiovascular Disease

## 2022-02-19 ENCOUNTER — Other Ambulatory Visit: Payer: Self-pay

## 2022-02-19 ENCOUNTER — Other Ambulatory Visit: Payer: Self-pay | Admitting: Internal Medicine

## 2022-02-19 VITALS — BP 114/76 | HR 102 | Resp 16 | Ht 64.0 in | Wt 127.0 lb

## 2022-02-19 DIAGNOSIS — B20 Human immunodeficiency virus [HIV] disease: Secondary | ICD-10-CM | POA: Diagnosis not present

## 2022-02-19 DIAGNOSIS — B370 Candidal stomatitis: Secondary | ICD-10-CM | POA: Diagnosis not present

## 2022-02-19 DIAGNOSIS — A539 Syphilis, unspecified: Secondary | ICD-10-CM | POA: Diagnosis not present

## 2022-02-19 DIAGNOSIS — Z23 Encounter for immunization: Secondary | ICD-10-CM | POA: Diagnosis not present

## 2022-02-19 NOTE — Progress Notes (Signed)
Patient ID: Rodney Walker, male   DOB: [birth year], 41 y.o.   MRN: [id number]  HPI  41yo M with well controlled hiv disease, emphysema, on biktarvy.  Denies ringing ears. Had some sores in his mouth that is now improved since receiving first dose of PCN.  Had pharyngitis for roughly 3 months. Received steroids for UC for pharyngitis. Feels like he has white coating in his mouth.  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   aspirin EC 81 MG tablet Take 1 tablet (81 mg total) by mouth 2 (two) times daily.   bictegravir-emtricitabine-tenofovir AF (BIKTARVY) 50-200-25 MG TABS tablet Take 1 tablet by mouth daily.   cetirizine (ZYRTEC) 10 MG tablet TAKE 1 TABLET(10 MG) BY MOUTH DAILY   darunavir-cobicistat (PREZCOBIX) 800-150 MG tablet TAKE 1 TABLET BY MOUTH DAILY WITH BREAKFAST. SWALLOW WHOLE. DO NOT CRUSH, BREAK OR CHEW TABLETS.TAKE WITH FOOD   fluconazole (DIFLUCAN) 150 MG tablet Day 1: Take 2 tablets. Day 2-7: Take 1 tablet daily.   flunisolide (NASALIDE) 25 MCG/ACT (0.025%) SOLN Place 2 sprays into the nose 2 (two) times daily.   metoprolol succinate (TOPROL-XL) 25 MG 24 hr tablet Take 1 tablet (25 mg total) by mouth daily. Please make overdue appt with Dr. Acie Fredrickson before anymore refills. Thank you 2nd attempt   nitroGLYCERIN (NITROSTAT) 0.4 MG SL tablet Place 1 tablet (0.4 mg total) under the tongue every 5 (five) minutes x 3 doses as needed for chest pain.   prasugrel (EFFIENT) 10 MG TABS tablet Take 1 tablet (10 mg total) by mouth daily. PLEASE MAKE APPT WITH [name] BEFORE ANY [phone]..3rd & FINAL  ATTEMPT   rosuvastatin (CRESTOR) 10 MG tablet TAKE 1 TABLET(10 MG) BY MOUTH DAILY   tiZANidine (ZANAFLEX) 2 MG tablet Take 1 tablet (2 mg total) by mouth every 8 (eight) hours as needed for muscle spasms.   Cholecalciferol (VITAMIN D3) 125 MCG (5000 UT) CAPS Take 1 capsule (5,000 Units total) by mouth daily. (Patient not taking: Reported on 02/19/2022)   methylPREDNISolone (MEDROL DOSEPAK) 4 MG TBPK tablet Sig as indicated (Patient not taking: Reported on 02/19/2022)   Sodium Sulfate-Mag Sulfate-KCl (SUTAB) [phone] MG TABS Take 1 kit by mouth as directed. MANUFACTURER CODES!! BIN: [id number] PCN: CN GROUP: [id number] MEMBER ID: [id number];FWY AS SECONDARY INSURANCE ;NO PRIOR AUTHORIZATION (Patient not taking: Reported on 02/19/2022)   No facility-administered encounter medications on file as of 02/19/2022.     Patient Active Problem List   Diagnosis Date Noted   Chest pain 09/30/2020   Hemorrhoid 09/30/2020   Avascular necrosis of bone of right hip ([place]) 09/25/2019   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   Hyperlipidemia 04/05/2018   Ischemic cardiomyopathy 04/05/2018   History of acute inferior wall MI 03/24/2018   Marijuana abuse, continuous 10/29/2016   Moderate persistent asthma without complication 63/78/5885   Allergic rhinitis 10/08/2016   Anemia 05/03/2015   Lung abscess ([place]) 04/22/2015   Bullous emphysema ([facility])    Lobar pneumonia ([facility])    Empyema lung ([place]) 04/16/2015   HIV (human immunodeficiency virus infection) ([place])    Tobacco abuse 03/21/2015  Lung mass 59/74/1638   Eosinophilic folliculitis 45/36/4680   Myalgia 04/23/2014   AIDS ([place]) 03/22/2014   CAP (community acquired pneumonia) 03/22/2014   CAD (coronary artery disease) 11/13/2011     Health Maintenance Due  Topic Date Due   COVID-19 Vaccine (5 - Booster for Pfizer series) 10/22/2021     Review of Systems 12 point ros is negative except what is mentioned above Physical Exam   BP 114/76    Pulse (!) 102   Resp 16   Ht _0  (1.626 m)   Wt 127 lb (57.6 kg)   SpO2 96%   BMI 21.80 kg/m   Physical Exam  Constitutional: He is oriented to person, place, and time. He appears well-developed and well-nourished. No distress.  HENT:  Mouth/Throat: Oropharynx is clear and moist. No oropharyngeal exudate.  Cardiovascular: Normal rate, regular rhythm and normal heart sounds. Exam reveals no gallop and no friction rub.  No murmur heard.  Pulmonary/Chest: Effort normal and breath sounds normal. No respiratory distress. He has no wheezes.  Neurological: He is alert and oriented to person, place, and time.  Skin: Skin is warm and dry. No rash noted. No erythema.  Psychiatric: He has a normal mood and affect. His behavior is normal.   Lab Results  Component Value Date   CD4TCELL 21 (L) 02/04/2022   Lab Results  Component Value Date   CD4TABS 764 10/30/2021   CD4TABS 1,021 08/12/2021   CD4TABS 685 12/23/2020   Lab Results  Component Value Date   HIV1RNAQUANT 23 (H) 02/04/2022   No results found for: HEPBSAB Lab Results  Component Value Date   LABRPR REACTIVE (A) 02/04/2022    CBC Lab Results  Component Value Date   WBC 15.9 (H) 02/04/2022   RBC 4.14 (L) 02/04/2022   HGB 13.8 02/04/2022   HCT 39.7 02/04/2022   PLT 346 02/04/2022   MCV 95.9 02/04/2022   MCH 33.3 (H) 02/04/2022   MCHC 34.8 02/04/2022   RDW 14.5 02/04/2022   LYMPHSABS 2,719 02/04/2022   MONOABS 0.9 01/09/2022   EOSABS 0 (L) 02/04/2022    BMET Lab Results  Component Value Date   NA 136 02/04/2022   K 4.2 02/04/2022   CL 103 02/04/2022   CO2 28 02/04/2022   GLUCOSE 100 (H) 02/04/2022   BUN 24 02/04/2022   CREATININE 1.11 02/04/2022   CALCIUM 9.2 02/04/2022   GFRNONAA >60 01/09/2022   GFRAA 97 12/23/2020      Assessment and Plan HIV disease= continue on biktarvy plus DRVc  Thrush = fluconazole 249m daily x 7 d Syphilis = PCN IM tomorrow and the last shot the following  Friday  Health maintenance = will give pneumonia vax 20 .

## 2022-02-19 NOTE — Telephone Encounter (Signed)
Follow up today. Pending appt

## 2022-02-20 ENCOUNTER — Ambulatory Visit (INDEPENDENT_AMBULATORY_CARE_PROVIDER_SITE_OTHER): Payer: Medicare Other

## 2022-02-20 ENCOUNTER — Other Ambulatory Visit: Payer: Self-pay

## 2022-02-20 DIAGNOSIS — A539 Syphilis, unspecified: Secondary | ICD-10-CM | POA: Diagnosis not present

## 2022-02-20 MED ORDER — PENICILLIN G BENZATHINE 1200000 UNIT/2ML IM SUSY
1.2000 10*6.[IU] | PREFILLED_SYRINGE | Freq: Once | INTRAMUSCULAR | Status: AC
  Administered 2022-02-20: 1.2 10*6.[IU] via INTRAMUSCULAR

## 2022-02-27 ENCOUNTER — Other Ambulatory Visit: Payer: Self-pay

## 2022-02-27 ENCOUNTER — Ambulatory Visit (INDEPENDENT_AMBULATORY_CARE_PROVIDER_SITE_OTHER): Payer: Medicare Other

## 2022-02-27 DIAGNOSIS — A539 Syphilis, unspecified: Secondary | ICD-10-CM | POA: Diagnosis not present

## 2022-02-28 ENCOUNTER — Other Ambulatory Visit: Payer: Self-pay | Admitting: Internal Medicine

## 2022-03-02 DIAGNOSIS — A539 Syphilis, unspecified: Secondary | ICD-10-CM

## 2022-03-02 MED ORDER — PENICILLIN G BENZATHINE 1200000 UNIT/2ML IM SUSY
1.2000 10*6.[IU] | PREFILLED_SYRINGE | Freq: Once | INTRAMUSCULAR | Status: AC
  Administered 2022-02-27: 1.2 10*6.[IU] via INTRAMUSCULAR

## 2022-03-02 MED ORDER — PENICILLIN G BENZATHINE 1200000 UNIT/2ML IM SUSY
1.2000 10*6.[IU] | PREFILLED_SYRINGE | Freq: Once | INTRAMUSCULAR | Status: AC
  Administered 2022-03-02: 1.2 10*6.[IU] via INTRAMUSCULAR

## 2022-03-02 NOTE — Telephone Encounter (Signed)
Awaiting provider approval.  ?

## 2022-03-17 ENCOUNTER — Other Ambulatory Visit: Payer: Self-pay | Admitting: Internal Medicine

## 2022-03-17 DIAGNOSIS — B20 Human immunodeficiency virus [HIV] disease: Secondary | ICD-10-CM

## 2022-05-26 ENCOUNTER — Encounter: Payer: Self-pay | Admitting: Internal Medicine

## 2022-05-26 ENCOUNTER — Ambulatory Visit (INDEPENDENT_AMBULATORY_CARE_PROVIDER_SITE_OTHER): Payer: Medicare Other | Admitting: Internal Medicine

## 2022-05-26 ENCOUNTER — Other Ambulatory Visit (HOSPITAL_COMMUNITY)
Admission: RE | Admit: 2022-05-26 | Discharge: 2022-05-26 | Disposition: A | Discharge status: Home / Self Care | Payer: Medicare Other | Source: Ambulatory Visit | Attending: Internal Medicine | Admitting: Internal Medicine

## 2022-05-26 ENCOUNTER — Other Ambulatory Visit: Payer: Self-pay

## 2022-05-26 VITALS — BP 113/71 | HR 96 | Temp 98.2°F | Wt 129.0 lb

## 2022-05-26 DIAGNOSIS — R634 Abnormal weight loss: Secondary | ICD-10-CM

## 2022-05-26 DIAGNOSIS — B20 Human immunodeficiency virus [HIV] disease: Secondary | ICD-10-CM | POA: Diagnosis present

## 2022-05-26 DIAGNOSIS — A539 Syphilis, unspecified: Secondary | ICD-10-CM

## 2022-05-26 DIAGNOSIS — Z79899 Other long term (current) drug therapy: Secondary | ICD-10-CM | POA: Diagnosis not present

## 2022-05-26 DIAGNOSIS — L80 Vitiligo: Secondary | ICD-10-CM

## 2022-05-26 MED ORDER — BIKTARVY 50-200-25 MG PO TABS: 1.0000 | ORAL_TABLET | Freq: Every day | ORAL | 11 refills | Status: DC

## 2022-05-26 MED ORDER — HYDROCORTISONE 1 % EX OINT: 1.0000 "application " | TOPICAL_OINTMENT | Freq: Two times a day (BID) | CUTANEOUS | 0 refills | Status: DC

## 2022-05-26 NOTE — Progress Notes (Signed)
RFV: follow up for hiv disease  Patient ID: Rodney Walker, male   DOB: [birth year], 41 y.o.   MRN: [id number]  HPI 41yo M with well controlled hiv disease, CD 4 count of 555/VL21 In feb.  Recently on vacation, on occasion takes medication later.   RPR  1:256- had 3 weekly doses of IM PCN - since tested + for syphilis  Not itching but has new white patch of skin on wrist.no painful, no swelling. No trauma  Getting laid off from work on June 2nd. (Off of work for 8 days for PTO). Not stressed about losing job. Needs time to work on his health. Had unintentional weight loss -from not eating correctly/balanced.  Had another episode of when felt weak, clammy, diaphoretic/then nausea/vomiting. Doesn't lose consciousness  Outpatient Encounter Medications as of 05/26/2022  Medication Sig   aspirin EC 81 MG tablet Take 1 tablet (81 mg total) by mouth 2 (two) times daily.   BIKTARVY 50-200-25 MG TABS tablet TAKE 1 TABLET BY MOUTH DAILY   cetirizine (ZYRTEC) 10 MG tablet TAKE 1 TABLET(10 MG) BY MOUTH DAILY   darunavir-cobicistat (PREZCOBIX) 800-150 MG tablet TAKE 1 TABLET BY MOUTH DAILY WITH BREAKFAST. SWALLOW WHOLE. DO NOT CRUSH, BREAK OR CHEW TABLETS. TAKE WITH FOOD   fluconazole (DIFLUCAN) 150 MG tablet Day 1: Take 2 tablets. Day 2-7: Take 1 tablet daily.   flunisolide (NASALIDE) 25 MCG/ACT (0.025%) SOLN Place 2 sprays into the nose 2 (two) times daily.   metoprolol succinate (TOPROL-XL) 25 MG 24 hr tablet Take 1 tablet (25 mg total) by mouth daily. Please make overdue appt with Dr. Acie Fredrickson before anymore refills. Thank you 2nd attempt   nitroGLYCERIN (NITROSTAT) 0.4 MG SL tablet Place 1 tablet (0.4 mg total) under the tongue every 5 (five) minutes x 3 doses as needed for chest pain.   prasugrel (EFFIENT) 10 MG TABS tablet Take 1 tablet (10 mg total) by mouth daily. PLEASE MAKE APPT WITH [name] BEFORE ANY [phone]..3rd & FINAL ATTEMPT   rosuvastatin (CRESTOR) 10 MG  tablet TAKE 1 TABLET(10 MG) BY MOUTH DAILY   Sodium Sulfate-Mag Sulfate-KCl (SUTAB) [phone] MG TABS Take 1 kit by mouth as directed. MANUFACTURER CODES!! BIN: [id number] PCN: CN GROUP: [id number] MEMBER ID: [id number];[employer] AS SECONDARY INSURANCE ;NO PRIOR AUTHORIZATION   tiZANidine (ZANAFLEX) 2 MG tablet Take 1 tablet (2 mg total) by mouth every 8 (eight) hours as needed for muscle spasms.   albuterol (VENTOLIN HFA) 108 (90 Base) MCG/ACT inhaler Inhale 2 puffs into the lungs every 6 (six) hours as needed for wheezing or shortness of breath. (Patient not taking: Reported on 05/26/2022)   Cholecalciferol (VITAMIN D3) 125 MCG (5000 UT) CAPS Take 1 capsule (5,000 Units total) by mouth daily. (Patient not taking: Reported on 05/26/2022)   methylPREDNISolone (MEDROL DOSEPAK) 4 MG TBPK tablet Sig as indicated (Patient not taking: Reported on 02/19/2022)   No facility-administered encounter medications on file as of 05/26/2022.     Patient Active Problem List   Diagnosis Date Noted   Chest pain 09/30/2020   Hemorrhoid 09/30/2020   Avascular necrosis of bone of right hip ([place]) 09/25/2019   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   Hyperlipidemia 04/05/2018   Ischemic cardiomyopathy 04/05/2018   History of acute inferior wall MI 03/24/2018   Marijuana abuse, continuous 10/29/2016   Moderate persistent asthma without complication 81/15/7262   Allergic rhinitis 10/08/2016   Anemia 05/03/2015   Lung abscess ([place]) 04/22/2015   Bullous emphysema ([place])  Lobar pneumonia ([place])    Empyema lung ([place]) 04/16/2015   HIV (human immunodeficiency virus infection) ([place])    Tobacco abuse 03/21/2015   Lung mass 55/37/4827   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  HENT: Negative for congestion, sore throat, rhinorrhea, sneezing, trouble swallowing and sinus pressure.  Eyes: Negative for photophobia and visual disturbance.  Respiratory: Negative for cough, chest tightness, shortness of breath, wheezing and stridor.  Cardiovascular: Negative for chest pain, palpitations and leg swelling.  Gastrointestinal: Negative for nausea, vomiting, abdominal pain, diarrhea, constipation, blood in stool, abdominal distention and anal bleeding.  Genitourinary: Negative for dysuria, hematuria, flank pain and difficulty urinating.  Musculoskeletal: Negative for myalgias, back pain, joint swelling, arthralgias and gait problem.  Skin: Negative for color change, pallor, rash and wound.  Neurological: Negative for dizziness, tremors, weakness and light-headedness.  Hematological: Negative for adenopathy. Does not bruise/bleed easily.  Psychiatric/Behavioral: Negative for behavioral problems, confusion, sleep disturbance, dysphoric mood, decreased concentration and agitation.   Physical Exam   BP 113/71   Pulse 96   Temp 98.2 F (36.8 C) (Oral)   Wt 129 lb (58.5 kg)   BMI 22.14 kg/m   Physical Exam  Constitutional: He is oriented to person, place, and time. He appears well-developed and well-nourished. No distress.  HENT:  Mouth/Throat: Oropharynx is clear and moist. No oropharyngeal exudate.  Cardiovascular: Normal rate, regular rhythm and normal heart sounds. Exam reveals no gallop and no friction rub.  No murmur heard.  Pulmonary/Chest: Effort normal and breath sounds normal. No respiratory distress. He has no wheezes.  Abdominal: Soft. Bowel sounds are normal. He exhibits no distension. There is no tenderness.  Lymphadenopathy:  He has no cervical adenopathy.  Neurological: He  is alert and oriented to person, place, and time.  Skin: Skin is warm and dry. Has white patch on right wrist. BEE:FEOFHQR clubbing Psychiatric: He has a normal mood and affect. His behavior is normal.   Lab Results  Component Value Date   CD4TCELL 21 (L) 02/04/2022   Lab Results  Component Value Date   CD4TABS 764 10/30/2021   CD4TABS 1,021 08/12/2021   CD4TABS 685 12/23/2020   Lab Results  Component Value Date   HIV1RNAQUANT 23 (H) 02/04/2022   No results found for: HEPBSAB Lab Results  Component Value Date   LABRPR REACTIVE (A) 02/04/2022    CBC Lab Results  Component Value Date   WBC 15.9 (H) 02/04/2022   RBC 4.14 (L) 02/04/2022   HGB 13.8 02/04/2022   HCT 39.7 02/04/2022   PLT 346 02/04/2022   MCV 95.9 02/04/2022   MCH 33.3 (H) 02/04/2022   MCHC 34.8 02/04/2022   RDW 14.5 02/04/2022   LYMPHSABS 2,719 02/04/2022   MONOABS 0.9 01/09/2022   EOSABS 0 (L) 02/04/2022    BMET Lab Results  Component Value Date   NA 136 02/04/2022   K 4.2 02/04/2022   CL 103 02/04/2022   CO2 28 02/04/2022   GLUCOSE 100 (H) 02/04/2022   BUN 24 02/04/2022   CREATININE 1.11 02/04/2022   CALCIUM 9.2 02/04/2022   GFRNONAA >60  01/09/2022   GFRAA 97 12/23/2020      Assessment and Plan  Hiv disease= will check labs. Continue on current regimen  Syphilis = will repeat RPR  Health maintenance = needs colonoscopy for colon ca screening;swab throat for gc/chlam. At next visit due anal pap  Unintentional weight loss = his dietary diary suggests not taking in enough calorie intake given demands of jobs.  Hypopigmentation = possible of fungal infection vs. Vitiligo.  ? Vasovagal episode = will have him check BP periodically; and check on calendar how often it occurs.

## 2022-05-28 LAB — CYTOLOGY, (ORAL, ANAL, URETHRAL) ANCILLARY ONLY
Chlamydia: NEGATIVE
Comment: NEGATIVE
Comment: NORMAL
Neisseria Gonorrhea: NEGATIVE

## 2022-05-28 LAB — T-HELPER CELL (CD4) - (RCID CLINIC ONLY)
CD4 % Helper T Cell: 22 % — ABNORMAL LOW (ref 33–65)
CD4 T Cell Abs: 789 /uL (ref 400–1790)

## 2022-05-29 LAB — COMPLETE METABOLIC PANEL WITH GFR
AG Ratio: 1.5 (calc) (ref 1.0–2.5)
ALT: 19 U/L (ref 9–46)
AST: 22 U/L (ref 10–40)
Albumin: 4.1 g/dL (ref 3.6–5.1)
Alkaline phosphatase (APISO): 72 U/L (ref 36–130)
BUN: 13 mg/dL (ref 7–25)
CO2: 26 mmol/L (ref 20–32)
Calcium: 8.9 mg/dL (ref 8.6–10.3)
Chloride: 104 mmol/L (ref 98–110)
Creat: 1.17 mg/dL (ref 0.60–1.29)
Globulin: 2.8 g/dL (calc) (ref 1.9–3.7)
Glucose, Bld: 93 mg/dL (ref 65–99)
Potassium: 3.3 mmol/L — ABNORMAL LOW (ref 3.5–5.3)
Sodium: 137 mmol/L (ref 135–146)
Total Bilirubin: 0.5 mg/dL (ref 0.2–1.2)
Total Protein: 6.9 g/dL (ref 6.1–8.1)
eGFR: 80 mL/min/{1.73_m2} (ref >=60)

## 2022-05-29 LAB — CBC WITH DIFFERENTIAL/PLATELET
Absolute Monocytes: 705 cells/uL (ref 200–950)
Basophils Absolute: 17 cells/uL (ref 0–200)
Basophils Relative: 0.2 %
Eosinophils Absolute: 26 cells/uL (ref 15–500)
Eosinophils Relative: 0.3 %
HCT: 41.6 % (ref 38.5–50.0)
Hemoglobin: 14.8 g/dL (ref 13.2–17.1)
Lymphs Abs: 3845 cells/uL (ref 850–3900)
MCH: 35.3 pg — ABNORMAL HIGH (ref 27.0–33.0)
MCHC: 35.6 g/dL (ref 32.0–36.0)
MCV: 99.3 fL (ref 80.0–100.0)
MPV: 9.7 fL (ref 7.5–12.5)
Monocytes Relative: 8.1 %
Neutro Abs: 4106 cells/uL (ref 1500–7800)
Neutrophils Relative %: 47.2 %
Platelets: 216 10*3/uL (ref 140–400)
RBC: 4.19 10*6/uL — ABNORMAL LOW (ref 4.20–5.80)
RDW: 13.7 % (ref 11.0–15.0)
Total Lymphocyte: 44.2 %
WBC: 8.7 10*3/uL (ref 3.8–10.8)

## 2022-05-29 LAB — HIV-1 RNA QUANT-NO REFLEX-BLD
HIV 1 RNA Quant: <20 Copies/mL — ABNORMAL HIGH
HIV-1 RNA Quant, Log: <1.3 Log cps/mL — ABNORMAL HIGH

## 2022-05-29 LAB — RPR TITER: RPR Titer: 1:16 {titer} — ABNORMAL HIGH

## 2022-05-29 LAB — FLUORESCENT TREPONEMAL AB(FTA)-IGG-BLD: Fluorescent Treponemal ABS: REACTIVE — AB

## 2022-05-29 LAB — RPR: RPR Ser Ql: REACTIVE — AB

## 2022-06-16 ENCOUNTER — Other Ambulatory Visit: Payer: Self-pay | Admitting: Emergency Medicine

## 2022-06-16 DIAGNOSIS — I2510 Atherosclerotic heart disease of native coronary artery without angina pectoris: Secondary | ICD-10-CM

## 2022-07-10 ENCOUNTER — Ambulatory Visit: Payer: Medicare Other | Admitting: Cardiovascular Disease

## 2022-07-21 ENCOUNTER — Other Ambulatory Visit: Payer: Self-pay | Admitting: Internal Medicine

## 2022-07-21 DIAGNOSIS — B20 Human immunodeficiency virus [HIV] disease: Secondary | ICD-10-CM

## 2022-07-21 NOTE — Telephone Encounter (Signed)
Please advise regarding patient's regimen, should he be on just Biktarvy or both Biktarvy and Prezcobix?   Sandie Ano, RN

## 2022-07-22 ENCOUNTER — Other Ambulatory Visit: Payer: Self-pay | Admitting: Internal Medicine

## 2022-07-22 DIAGNOSIS — B20 Human immunodeficiency virus [HIV] disease: Secondary | ICD-10-CM

## 2022-07-22 NOTE — Telephone Encounter (Signed)
We are having a hard time figuring it out. One note says both and then another says just biktarvy. I would check with Dr. Drue Second please.

## 2022-07-22 NOTE — Telephone Encounter (Signed)
Per Dr. Drue Second, patient should be on both Biktarvy and Prezcobix.   Sandie Ano, RN

## 2022-07-22 NOTE — Telephone Encounter (Signed)
Are either of you able to tell what regimen he's supposed to be on? Doesn't look like the Biktarvy and Prezcobix were filled together last time and I want to make sure I don't miss anything, thanks!

## 2022-07-28 ENCOUNTER — Encounter (HOSPITAL_COMMUNITY): Payer: Self-pay

## 2022-07-28 ENCOUNTER — Emergency Department (HOSPITAL_COMMUNITY)
Admission: EM | Admit: 2022-07-28 | Discharge: 2022-07-29 | Disposition: A | Discharge status: Home / Self Care | Payer: Medicare Other | Attending: Emergency Medicine | Admitting: Emergency Medicine

## 2022-07-28 ENCOUNTER — Ambulatory Visit (HOSPITAL_COMMUNITY)
Admission: EM | Admit: 2022-07-28 | Discharge: 2022-07-28 | Disposition: A | Discharge status: Home / Self Care | Payer: Medicare Other | Attending: Family Medicine | Admitting: Family Medicine

## 2022-07-28 ENCOUNTER — Other Ambulatory Visit: Payer: Self-pay

## 2022-07-28 ENCOUNTER — Emergency Department (HOSPITAL_COMMUNITY): Payer: Medicare Other

## 2022-07-28 DIAGNOSIS — Z7982 Long term (current) use of aspirin: Secondary | ICD-10-CM | POA: Diagnosis not present

## 2022-07-28 DIAGNOSIS — R079 Chest pain, unspecified: Secondary | ICD-10-CM | POA: Insufficient documentation

## 2022-07-28 DIAGNOSIS — I2510 Atherosclerotic heart disease of native coronary artery without angina pectoris: Secondary | ICD-10-CM | POA: Diagnosis not present

## 2022-07-28 DIAGNOSIS — R0789 Other chest pain: Secondary | ICD-10-CM | POA: Diagnosis not present

## 2022-07-28 DIAGNOSIS — J439 Emphysema, unspecified: Secondary | ICD-10-CM | POA: Diagnosis not present

## 2022-07-28 DIAGNOSIS — Z21 Asymptomatic human immunodeficiency virus [HIV] infection status: Secondary | ICD-10-CM | POA: Insufficient documentation

## 2022-07-28 DIAGNOSIS — Z955 Presence of coronary angioplasty implant and graft: Secondary | ICD-10-CM | POA: Insufficient documentation

## 2022-07-28 LAB — TROPONIN I (HIGH SENSITIVITY): Troponin I (High Sensitivity): 5 ng/L (ref <18)

## 2022-07-28 LAB — BASIC METABOLIC PANEL
Anion gap: 7 (ref 5–15)
BUN: 13 mg/dL (ref 6–20)
CO2: 27 mmol/L (ref 22–32)
Calcium: 9.3 mg/dL (ref 8.9–10.3)
Chloride: 106 mmol/L (ref 98–111)
Creatinine, Ser: 1.11 mg/dL (ref 0.61–1.24)
GFR, Estimated: >60 mL/min (ref >60)
Glucose, Bld: 96 mg/dL (ref 70–99)
Potassium: 3.9 mmol/L (ref 3.5–5.1)
Sodium: 140 mmol/L (ref 135–145)

## 2022-07-28 LAB — CBC WITH DIFFERENTIAL/PLATELET
Abs Immature Granulocytes: 0.02 10*3/uL (ref 0.00–0.07)
Basophils Absolute: 0 10*3/uL (ref 0.0–0.1)
Basophils Relative: 0 %
Eosinophils Absolute: 0.1 10*3/uL (ref 0.0–0.5)
Eosinophils Relative: 1 %
HCT: 41.1 % (ref 39.0–52.0)
Hemoglobin: 15.4 g/dL (ref 13.0–17.0)
Immature Granulocytes: 0 %
Lymphocytes Relative: 41 %
Lymphs Abs: 4.8 10*3/uL — ABNORMAL HIGH (ref 0.7–4.0)
MCH: 34.9 pg — ABNORMAL HIGH (ref 26.0–34.0)
MCHC: 37.5 g/dL — ABNORMAL HIGH (ref 30.0–36.0)
MCV: 93.2 fL (ref 80.0–100.0)
Monocytes Absolute: 1 10*3/uL (ref 0.1–1.0)
Monocytes Relative: 8 %
Neutro Abs: 5.9 10*3/uL (ref 1.7–7.7)
Neutrophils Relative %: 50 %
Platelets: 219 10*3/uL (ref 150–400)
RBC: 4.41 MIL/uL (ref 4.22–5.81)
RDW: 12.8 % (ref 11.5–15.5)
WBC: 11.7 10*3/uL — ABNORMAL HIGH (ref 4.0–10.5)
nRBC: 0 % (ref 0.0–0.2)

## 2022-07-28 MED ORDER — IOHEXOL 350 MG/ML SOLN
60.0000 mL | Freq: Once | INTRAVENOUS | Status: AC | PRN
  Administered 2022-07-28: 60 mL via INTRAVENOUS

## 2022-07-28 MED ORDER — ASPIRIN 81 MG PO CHEW
324.0000 mg | CHEWABLE_TABLET | Freq: Once | ORAL | Status: AC
  Administered 2022-07-28: 324 mg via ORAL
  Filled 2022-07-28: qty 4

## 2022-07-28 NOTE — ED Notes (Signed)
Patient is being discharged from the Urgent Care and sent to the Emergency Department via POV . Per Dr Marlinda Mike, patient is in need of higher level of care due to chest pain. Patient is aware and verbalizes understanding of plan of care.  Vitals:   07/28/22 1932  BP: 108/76  Pulse: 70  Resp: 16  Temp: 98.5 F (36.9 C)  SpO2: 100%

## 2022-07-28 NOTE — ED Provider Triage Note (Signed)
Emergency Medicine Provider Triage Evaluation Note  Rodney Walker , a 41 y.o. male  was evaluated in triage.  Pt complains of left-sided chest pain that radiates to left shoulder that has been present for the past few hours.  Patient evaluated urgent care prior to arrival and sent to the ED for further evaluation.  History of 2 MIs.  He also admits to previous history of blood clots.  Currently on ASA 81 mg.  Denies lower extremity edema.  Denies shortness of breath.  Review of Systems  Positive: CP Negative: SOB  Physical Exam  BP 104/72   Pulse 71   Temp 98.4 F (36.9 C) (Oral)   Resp 17   SpO2 97%  Gen:   Awake, no distress   Resp:  Normal effort  MSK:   Moves extremities without difficulty  Other:  Tenderness throughout left pectoral muscle.  Medical Decision Making  Medically screening exam initiated at 8:33 PM.  Appropriate orders placed.  Rodney Walker was informed that the remainder of the evaluation will be completed by another provider, this initial triage assessment does not replace that evaluation, and the importance of remaining in the ED until their evaluation is complete.  CP labs   Rodney Walker 07/28/22 2040

## 2022-07-28 NOTE — ED Provider Notes (Addendum)
[facility]    CSN: [id number] Arrival date & time: 07/28/22  1918      History   Chief Complaint Chief Complaint  Patient presents with   Chest Pain    HPI Rodney Walker is a 41 y.o. male.    Chest Pain  Here for central chest pain that is been going on for about 2 hours before he arrived here.  He is maybe a burning, may be a pressure.  He cannot tell that it is really radiating to his left arm.  He does not have any associated dyspnea or diaphoresis or nausea.  No fever or chills or cough  Past medical history is significant for 2 myocardial infarctions in the past, the most recent about 3 years ago.  Past Medical History:  Diagnosis Date   Anemia    COPD (chronic obstructive pulmonary disease) ([place])    Coronary artery disease    MI 2010 - dissection/plaque rupture in LAD tx with antiplt/antithrombotics >> relook cath with resolution (no PCI performed) // Inf STEMI 3/19: OM1 30; pRCA 100 >> DES // Echo 3/19:  mild LVH, EF 40-45, inf/inf-sept HK, trivial MR   Eczema    History of blood clots    History of myocardial infarction 2010; 2019   History of pleural empyema    HIV infection ([place])    Hyperlipidemia    Hypertension    Immune deficiency disorder ([place])    Ischemic cardiomyopathy 04/05/2018   Moderate persistent asthma without complication 61/01/2448   Myocardial infarction ([place])    2009, 2019   Pneumonia     Patient Active Problem List   Diagnosis Date Noted   Chest pain 09/30/2020   Hemorrhoid 09/30/2020   Avascular necrosis of bone of right hip ([place]) 09/25/2019   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   Hyperlipidemia 04/05/2018   Ischemic cardiomyopathy 04/05/2018   History of acute inferior wall MI 03/24/2018   Marijuana abuse, continuous 10/29/2016   Moderate persistent asthma without complication 75/30/0511   Allergic rhinitis 10/08/2016   Anemia 05/03/2015   Lung abscess ([place]) 04/22/2015   Bullous emphysema  ([place])    Lobar pneumonia ([place])    Empyema lung ([place]) 04/16/2015   HIV (human immunodeficiency virus infection) ([place])    Tobacco abuse 03/21/2015   Lung mass 02/07/1734   Eosinophilic folliculitis 67/12/4101   Myalgia 04/23/2014   AIDS ([place]) 03/22/2014   CAP (community acquired pneumonia) 03/22/2014   CAD (coronary artery disease) 11/13/2011    Past Surgical History:  Procedure Laterality Date   CARDIAC CATHETERIZATION  06/24/2009   EF 60%   CARDIAC CATHETERIZATION  06/17/2009   EF 45%. ANTERIOR HYPOKINESIS   CORONARY STENT INTERVENTION N/A 03/24/2018   Procedure: CORONARY STENT INTERVENTION;  Surgeon: Sherren Mocha, MD;  Location: [facility];  Service: Cardiovascular;  Laterality: N/A;   CORONARY/GRAFT ACUTE MI REVASCULARIZATION N/A 03/24/2018   Procedure: Coronary/Graft Acute MI Revascularization;  Surgeon: Sherren Mocha, MD;  Location: [facility];  Service: Cardiovascular;  Laterality: N/A;   LEFT HEART CATH AND CORONARY ANGIOGRAPHY N/A 03/24/2018   Procedure: LEFT HEART CATH AND CORONARY ANGIOGRAPHY;  Surgeon: Sherren Mocha, MD;  Location: [facility];  Service: Cardiovascular;  Laterality: N/A;   TOTAL HIP ARTHROPLASTY Right 09/25/2019   Procedure: RIGHT TOTAL HIP ARTHROPLASTY ANTERIOR APPROACH;  Surgeon: Leandrew Koyanagi, MD;  Location: [facility];  Service: Orthopedics;  Laterality: Right;   US ECHOCARDIOGRAPHY  12/09/2009  EF 55-60%   VIDEO ASSISTED THORACOSCOPY (VATS)/ LOBECTOMY Right 04/23/2015   Procedure: VIDEO ASSISTED THORACOSCOPY (VATS)/RIGHT upper  and right middle LOBECTOMY;  Surgeon: Ivin Poot, MD;  Location: [facility];  Service: Thoracic;  Laterality: Right;   VIDEO BRONCHOSCOPY Bilateral 09/05/2014   Procedure: VIDEO BRONCHOSCOPY WITH FLUORO;  Surgeon: Wilhelmina Mcardle, MD;  Location: [facility];  Service: Cardiopulmonary;  Laterality: Bilateral;   VIDEO BRONCHOSCOPY Bilateral 04/10/2015   Procedure: VIDEO BRONCHOSCOPY WITH FLUORO;  Surgeon: Juanito Doom, MD;  Location: [facility] ENDOSCOPY;  Service: Cardiopulmonary;  Laterality: Bilateral;       Home Medications    Prior to Admission medications   Medication Sig Start Date End Date Taking? Authorizing Provider  albuterol (VENTOLIN HFA) 108 (90 Base) MCG/ACT inhaler Inhale 2 puffs into the lungs every 6 (six) hours as needed for wheezing or shortness of breath. Patient not taking: Reported on 05/26/2022 09/19/20   Orason Callas, NP  aspirin EC 81 MG tablet Take 1 tablet (81 mg total) by mouth 2 (two) times daily. 09/25/19   Leandrew Koyanagi, MD  bictegravir-emtricitabine-tenofovir AF (BIKTARVY) 50-200-25 MG TABS tablet Take 1 tablet by mouth daily. 05/26/22   Carlyle Basques, MD  cetirizine (ZYRTEC) 10 MG tablet TAKE 1 TABLET(10 MG) BY MOUTH DAILY 03/02/22   Carlyle Basques, MD  Cholecalciferol (VITAMIN D3) 125 MCG (5000 UT) CAPS Take 1 capsule (5,000 Units total) by mouth daily. Patient not taking: Reported on 05/26/2022 09/24/20   Dortches Callas, NP  fluconazole (DIFLUCAN) 150 MG tablet Day 1: Take 2 tablets. Day 2-7: Take 1 tablet daily. 12/27/21   Jaynee Eagles, PA-C  flunisolide (NASALIDE) 25 MCG/ACT (0.025%) SOLN Place 2 sprays into the nose 2 (two) times daily. 11/13/21   Carlyle Basques, MD  hydrocortisone 1 % ointment Apply 1 application. topically 2 (two) times daily. To right wrist 05/26/22   Carlyle Basques, MD  metoprolol succinate (TOPROL-XL) 25 MG 24 hr tablet Take 1 tablet (25 mg total) by mouth daily. Please make overdue appt with Dr. Acie Fredrickson before anymore refills. Thank you 2nd attempt 02/19/22   [name], Wonda Cheng, MD  nitroGLYCERIN (NITROSTAT) 0.4 MG SL tablet Place 1 tablet (0.4 mg total) under the tongue every 5 (five) minutes x 3 doses as needed for chest pain. 06/27/20   [name], Wonda Cheng, MD  prasugrel (EFFIENT) 10 MG TABS tablet Take 1 tablet (10 mg total) by mouth daily. PLEASE MAKE APPT WITH [name] BEFORE ANY [phone]..3rd & FINAL ATTEMPT 01/20/22   [name],  Wonda Cheng, MD  PREZCOBIX 800-150 MG tablet TAKE 1 TABLET BY MOUTH DAILY WITH BREAKFAST. SWALLOW WHOLE. DO NOT CRUSH, BREAK OR CHEW TABLETS. TAKE WITH FOOD 07/22/22   Carlyle Basques, MD  rosuvastatin (CRESTOR) 10 MG tablet TAKE 1 TABLET(10 MG) BY MOUTH DAILY 11/21/21   Horald Pollen, MD  Sodium Sulfate-Mag Sulfate-KCl (SUTAB) [phone] MG TABS Take 1 kit by mouth as directed. MANUFACTURER CODES!! BIN: [id number] PCN: CN GROUP: [id number] MEMBER ID: [id number];[employer] AS SECONDARY INSURANCE ;NO PRIOR AUTHORIZATION 12/04/20   Willia Craze, NP  tiZANidine (ZANAFLEX) 2 MG tablet Take 1 tablet (2 mg total) by mouth every 8 (eight) hours as needed for muscle spasms. 01/09/22   Hazel Sams, PA-C    Family History Family History  Problem Relation Age of Onset   Diabetes Mother    Eczema Mother    Asthma Mother    COPD Mother    Hypertension Mother    Hyperlipidemia Mother  Stroke Mother    Heart disease Mother    Sleep apnea Mother    Liver cancer Father    Asthma Brother    Diabetes Maternal Grandmother    Heart failure Maternal Grandmother     Social History Social History   Tobacco Use   Smoking status: Former    Packs/day: 0.25    Years: 17.00    Total pack years: 4.25    Types: Cigarettes, Cigars    Quit date: 03/23/2015    Years since quitting: 7.3   Smokeless tobacco: Never  Vaping Use   Vaping Use: Never used  Substance Use Topics   Alcohol use: Yes    Comment: occ   Drug use: Not Currently    Frequency: 7.0 times per week    Types: Marijuana     Allergies   Patient has no known allergies.   Review of Systems Review of Systems  Cardiovascular:  Positive for chest pain.     Physical Exam Triage Vital Signs ED Triage Vitals  Enc Vitals Group     BP 07/28/22 1932 108/76     Pulse Rate 07/28/22 1932 70     Resp 07/28/22 1932 16     Temp 07/28/22 1932 98.5 F (36.9 C)     Temp Source 07/28/22 1932 Oral     SpO2 07/28/22 1932 100 %     Weight --       Height --      Head Circumference --      Peak Flow --      Pain Score 07/28/22 1931 4     Pain Loc --      Pain Edu? --      Excl. in [place]? --    No data found.  Updated Vital Signs BP 108/76 (BP Location: Right Arm)   Pulse 70   Temp 98.5 F (36.9 C) (Oral)   Resp 16   SpO2 100%   Visual Acuity Right Eye Distance:   Left Eye Distance:   Bilateral Distance:    Right Eye Near:   Left Eye Near:    Bilateral Near:     Physical Exam Constitutional:      General: He is not in acute distress.    Appearance: He is not ill-appearing, toxic-appearing or diaphoretic.  Cardiovascular:     Rate and Rhythm: Normal rate and regular rhythm.     Heart sounds: No murmur heard. Pulmonary:     Breath sounds: Normal breath sounds.  Skin:    Coloration: Skin is not jaundiced or pale.  Neurological:     Mental Status: He is alert and oriented to person, place, and time.  Psychiatric:        Behavior: Behavior normal.      UC Treatments / Results  Labs (all labs ordered are listed, but only abnormal results are displayed) Labs Reviewed - No data to display  EKG   Radiology No results found.  Procedures Procedures (including critical care time)  Medications Ordered in UC Medications - No data to display  Initial Impression / Assessment and Plan / UC Course  I have reviewed the triage vital signs and the nursing notes.  Pertinent labs & imaging results that were available during my care of the patient were reviewed by me and considered in my medical decision making (see chart for details).    EKG shows no acute changes; it does show an old septal infarct I have asked him to proceed  to the emergency room by private vehicle for higher level of care.  Vital signs are within normal limits here. Final Clinical Impressions(s) / UC Diagnoses   Final diagnoses:  Chest pain, unspecified type     Discharge Instructions      Please proceed to the nearest emergency  room for further evaluation and a higher level of care than we can provide here in the urgent care    ED Prescriptions   None    PDMP not reviewed this encounter.   Barrett Henle, MD 07/28/22 2027    Barrett Henle, MD 07/28/22 2029

## 2022-07-28 NOTE — Discharge Instructions (Signed)
Please proceed to the nearest emergency room for further evaluation and a higher level of care than we can provide here in the urgent care

## 2022-07-28 NOTE — ED Triage Notes (Signed)
Pt sent here by UC for cp that radiate to L shoulder/arm that started this afternoon. Pt states pain is worse w/ movement of L arm. PT states he has a hx of 2 MI's and blood clots. Pt denies shob, N/V.

## 2022-07-28 NOTE — ED Provider Notes (Signed)
[name] Provider Note   CSN: [id number] Arrival date & time: 07/28/22  1953     History {Add pertinent medical, surgical, social history, OB history to HPI:1} Chief Complaint  Patient presents with   Chest Pain    Rodney Walker is a 41 y.o. male.  The history is provided by the patient and medical records.  Chest Pain  41 year old male with history of HIV, coronary artery disease, history of PE as well as MI, presenting to the ED for chest pain.  States it began this evening around 6pm.  States left-sided with some radiation into his shoulder.  He denies any shortness of breath, diaphoresis, nausea, or vomiting.  He has not had any recent cough, fever, or chills.  He does report history of MI with stenting x1 as well as PE.  He states he completed 6 months of anticoagulation, now is only on aspirin.  Home Medications Prior to Admission medications   Medication Sig Start Date End Date Taking? Authorizing Provider  albuterol (VENTOLIN HFA) 108 (90 Base) MCG/ACT inhaler Inhale 2 puffs into the lungs every 6 (six) hours as needed for wheezing or shortness of breath. Patient not taking: Reported on 05/26/2022 09/19/20   Biloxi Callas, NP  aspirin EC 81 MG tablet Take 1 tablet (81 mg total) by mouth 2 (two) times daily. 09/25/19   Leandrew Koyanagi, MD  bictegravir-emtricitabine-tenofovir AF (BIKTARVY) 50-200-25 MG TABS tablet Take 1 tablet by mouth daily. 05/26/22   Carlyle Basques, MD  cetirizine (ZYRTEC) 10 MG tablet TAKE 1 TABLET(10 MG) BY MOUTH DAILY 03/02/22   Carlyle Basques, MD  Cholecalciferol (VITAMIN D3) 125 MCG (5000 UT) CAPS Take 1 capsule (5,000 Units total) by mouth daily. Patient not taking: Reported on 05/26/2022 09/24/20   [place] Callas, NP  fluconazole (DIFLUCAN) 150 MG tablet Day 1: Take 2 tablets. Day 2-7: Take 1 tablet daily. 12/27/21   Jaynee Eagles, PA-C  flunisolide (NASALIDE) 25 MCG/ACT (0.025%) SOLN Place 2 sprays  into the nose 2 (two) times daily. 11/13/21   Carlyle Basques, MD  hydrocortisone 1 % ointment Apply 1 application. topically 2 (two) times daily. To right wrist 05/26/22   Carlyle Basques, MD  metoprolol succinate (TOPROL-XL) 25 MG 24 hr tablet Take 1 tablet (25 mg total) by mouth daily. Please make overdue appt with Dr. Acie Fredrickson before anymore refills. Thank you 2nd attempt 02/19/22   [name], Wonda Cheng, MD  nitroGLYCERIN (NITROSTAT) 0.4 MG SL tablet Place 1 tablet (0.4 mg total) under the tongue every 5 (five) minutes x 3 doses as needed for chest pain. 06/27/20   [name], Wonda Cheng, MD  prasugrel (EFFIENT) 10 MG TABS tablet Take 1 tablet (10 mg total) by mouth daily. PLEASE MAKE APPT WITH [name] BEFORE ANY [phone]..3rd & FINAL ATTEMPT 01/20/22   [name], Wonda Cheng, MD  PREZCOBIX 800-150 MG tablet TAKE 1 TABLET BY MOUTH DAILY WITH BREAKFAST. SWALLOW WHOLE. DO NOT CRUSH, BREAK OR CHEW TABLETS. TAKE WITH FOOD 07/22/22   Carlyle Basques, MD  rosuvastatin (CRESTOR) 10 MG tablet TAKE 1 TABLET(10 MG) BY MOUTH DAILY 11/21/21   Horald Pollen, MD  Sodium Sulfate-Mag Sulfate-KCl (SUTAB) [phone] MG TABS Take 1 kit by mouth as directed. MANUFACTURER CODES!! BIN: [id number] PCN: CN GROUP: [id number] MEMBER ID: [id number];[employer] SEC[employer]ARY INS[employer]ANCE ;NO PRIOR AUTHORIZATION 12/04/20   Willia Craze, NP  tiZANidine (ZANAFLEX) 2 MG tablet Take 1 tablet (2 mg total) by mouth every 8 (eight) hours as needed  for muscle spasms. 01/09/22   Hazel Sams, PA-C      Allergies    Patient has no known allergies.    Review of Systems   Review of Systems  Cardiovascular:  Positive for chest pain.  All other systems reviewed and are negative.   Physical Exam Updated Vital Signs BP 104/72   Pulse 71   Temp 98.4 F (36.9 C) (Oral)   Resp 17   SpO2 97%   Physical Exam Vitals and nursing note reviewed.  Constitutional:      Appearance: He is well-developed.  HENT:     Head: Normocephalic and  atraumatic.  Eyes:     Conjunctiva/sclera: Conjunctivae normal.     Pupils: Pupils are equal, round, and reactive to light.  Cardiovascular:     Rate and Rhythm: Normal rate and regular rhythm.     Heart sounds: Normal heart sounds.  Pulmonary:     Effort: Pulmonary effort is normal.     Breath sounds: Normal breath sounds.  Abdominal:     General: Bowel sounds are normal.     Palpations: Abdomen is soft.  Musculoskeletal:        General: Normal range of motion.     Cervical back: Normal range of motion.  Skin:    General: Skin is warm and dry.  Neurological:     Mental Status: He is alert and oriented to person, place, and time.     ED Results / Procedures / Treatments   Labs (all labs ordered are listed, but only abnormal results are displayed) Labs Reviewed  CBC WITH DIFFERENTIAL/PLATELET - Abnormal; Notable for the following components:      Result Value   WBC 11.7 (*)    MCH 34.9 (*)    MCHC 37.5 (*)    Lymphs Abs 4.8 (*)    All other components within normal limits  BASIC METABOLIC PANEL  TROPONIN I (HIGH SENSITIVITY)  TROPONIN I (HIGH SENSITIVITY)    EKG None  Radiology DG Chest 1 View  Result Date: 07/28/2022 CLINICAL DATA:  Chest pain EXAM: CHEST  1 VIEW COMPARISON:  Radiographs 01/09/2022 FINDINGS: Similar chronic volume loss, surgical change, and hazy density in the right mid and lower lung. Hyperinflation with flattening of the left hemidiaphragm. No new focal consolidation, pleural effusion, or pneumothorax. Normal cardiomediastinal silhouette. No acute osseous abnormality. Remote right rib fractures. IMPRESSION: Emphysema and postoperative changes right chest. No significant change from prior. Electronically Signed   By: Placido Sou M.D.   On: 07/28/2022 21:06    Procedures Procedures  {Document cardiac monitor, telemetry assessment procedure when appropriate:1}  Medications Ordered in ED Medications  aspirin chewable tablet 324 mg (324 mg Oral  Given 07/28/22 2043)    ED Course/ Medical Decision Making/ A&P                           Medical Decision Making Amount and/or Complexity of Data Reviewed Radiology: ordered.   ***  {Document critical care time when appropriate:1} {Document review of labs and clinical decision tools ie heart score, Chads2Vasc2 etc:1}  {Document your independent review of radiology images, and any outside records:1} {Document your discussion with family members, caretakers, and with consultants:1} {Document social determinants of health affecting pt's care:1} {Document your decision making why or why not admission, treatments were needed:1} Final Clinical Impression(s) / ED Diagnoses Final diagnoses:  None    Rx / DC Orders ED Discharge Orders  None       

## 2022-07-28 NOTE — ED Triage Notes (Signed)
Pt states left sided chest pain radiating to left arm for the past 3 hours, worse with movement and coughing.States he has had 2 MI's in the past.

## 2022-07-29 DIAGNOSIS — R079 Chest pain, unspecified: Secondary | ICD-10-CM | POA: Diagnosis not present

## 2022-07-29 LAB — TROPONIN I (HIGH SENSITIVITY): Troponin I (High Sensitivity): 4 ng/L (ref <18)

## 2022-07-29 NOTE — Discharge Instructions (Signed)
There is a nodule in your right lung-- this will need follow-up with your primary care doctor. Return here for new concerns.

## 2022-08-05 ENCOUNTER — Encounter: Payer: Self-pay | Admitting: Emergency Medicine

## 2022-08-05 ENCOUNTER — Ambulatory Visit (INDEPENDENT_AMBULATORY_CARE_PROVIDER_SITE_OTHER): Payer: Medicare Other | Admitting: Emergency Medicine

## 2022-08-05 VITALS — BP 102/64 | HR 68 | Temp 97.8°F | Ht 64.0 in | Wt 127.1 lb

## 2022-08-05 DIAGNOSIS — I252 Old myocardial infarction: Secondary | ICD-10-CM | POA: Diagnosis not present

## 2022-08-05 DIAGNOSIS — Z21 Asymptomatic human immunodeficiency virus [HIV] infection status: Secondary | ICD-10-CM | POA: Diagnosis not present

## 2022-08-05 DIAGNOSIS — R911 Solitary pulmonary nodule: Secondary | ICD-10-CM

## 2022-08-05 DIAGNOSIS — I25118 Atherosclerotic heart disease of native coronary artery with other forms of angina pectoris: Secondary | ICD-10-CM | POA: Diagnosis not present

## 2022-08-05 DIAGNOSIS — J439 Emphysema, unspecified: Secondary | ICD-10-CM

## 2022-08-05 DIAGNOSIS — E785 Hyperlipidemia, unspecified: Secondary | ICD-10-CM

## 2022-08-05 NOTE — Assessment & Plan Note (Signed)
Stable.  On medications managed by infectious disease doctor

## 2022-08-05 NOTE — Assessment & Plan Note (Signed)
Recent CT scan of chest reviewed with patient. 4 mm nodule at left apex. Needs pulmonary follow-up.  Referral placed today.

## 2022-08-05 NOTE — Assessment & Plan Note (Signed)
Former smoker.  Still smoking marijuana.  Smoking cessation advice given. Stable.  No recent use of albuterol.

## 2022-08-05 NOTE — Patient Instructions (Signed)
Pulmonary Nodule  A pulmonary nodule is a small, round growth of tissue in the lung. A nodule may be cancer, but most nodules are not cancer. What are the causes? Infection from a germ (bacteria, fungus, or virus), such as tuberculosis. Tissue that is cancer, such as: Cancer in the lung. Cancer that has spread to the lung from another part of the body. A growth of tissue (mass) that is not cancer. Swelling and irritation from conditions such as rheumatoid arthritis. Having blood vessels that are not normal in the lungs. What are the signs or symptoms? Many times, there are no symptoms. If you get symptoms, they normally have another cause, such as infection. How is this treated? Treatment depends on: If your nodule is cancer or if it is not cancer. What your risk of getting cancer is. Some nodules are not cancer. If this is the case for you, you may not need treatment. Your doctor may do tests to watch the nodule for changes. If the nodule is cancer: You will need tests, such as CT and PET scans. You may need treatment. This may include: Surgery. Treatment with high-energy X-rays (radiation therapy). Medicines. Some nodules need to be taken out. You may have a procedure to have the nodule taken out. During the procedure, your doctor will make a cut (incision) into your chest and take out the part of your lung that has the nodule. Follow these instructions at home: Take over-the-counter and prescription medicines only as told by your doctor. Do not smoke or use any products that contain nicotine or tobacco. If you need help quitting, ask your doctor. Keep all follow-up visits. Contact a doctor if: You have pain in your chest, back, or shoulder. You are short of breath or have trouble breathing when you are active. You get a cough. Your voice starts to sound raspy, breathy, or strained (hoarse), and you do not know why. You feel sick or more tired than normal. You do not feel like  eating. You lose weight without trying. You get chills, or you start to sweat a lot during sleep. You need two or more pillows to sleep on at night. You have: A fever and your symptoms get worse all of a sudden. A fever or symptoms for more than 2-3 days. Get help right away if: You cannot catch your breath. You have sudden chest pain. You start making high-pitched whistling sounds when you breathe, most often when you breathe out (you wheeze). You cannot stop coughing. You cough up blood or bloody mucus from your lungs (sputum). You get dizzy or feel like you may faint. These symptoms may represent a serious problem that is an emergency. Do not wait to see if the symptoms will go away. Get medical help right away. Call your local emergency services (911 in the [place] Do not drive yourself to the hospital. Summary A pulmonary nodule is a small, round growth of tissue in the lung. Most of these nodules are not cancer. Common causes of nodules in the lung include infection, swelling and irritation, and growths that are not cancer. Treatment depends on whether the nodule is cancer or is not cancer. Treatment also depends on your risk of getting cancer. If the nodule is cancer, you will need certain tests and treatments as told by your doctor. This information is not intended to replace advice given to you by your health care provider. Make sure you discuss any questions you have with your health care provider. Document   Revised: 07/03/2020 Document Reviewed: 07/03/2020 Elsevier Patient Education  2023 [employer]  

## 2022-08-05 NOTE — Assessment & Plan Note (Signed)
Stable.  No recent use of nitroglycerin. Continue daily baby aspirin and metoprolol succinate 25 mg daily.

## 2022-08-05 NOTE — Assessment & Plan Note (Signed)
Stable.  Diet and nutrition discussed.  Continue rosuvastatin 10 mg daily.  

## 2022-08-05 NOTE — Progress Notes (Addendum)
Rodney Walker 41 y.o.   Chief Complaint  Patient presents with  . Follow-up    No concerns, no having chest pains anymore     HISTORY OF PRESENT ILLNESS: This is a 41 y.o. male here for follow-up of emergency department visit on 07/28/2022 when he presented complaining of chest pain that lasted about 2 days.  Negative workup. Asymptomatic today.  No complaints or medical concerns. History of HIV disease on 2 medications.  Stable. Former smoker with history of emphysema.  History of right upper lobe resection in the past. CT scan of chest showed left apex pulmonary nodule 4 mm. It also showed aortic atherosclerosis and emphysema. Patient has history of coronary artery disease status post MI in the past. Takes daily aspirin, beta-blocker, and rosuvastatin. Occasionally smokes marijuana. Needs pulmonary referral.  HPI   Prior to Admission medications   Medication Sig Start Date End Date Taking? Authorizing Provider  albuterol (VENTOLIN HFA) 108 (90 Base) MCG/ACT inhaler Inhale 2 puffs into the lungs every 6 (six) hours as needed for wheezing or shortness of breath. Patient not taking: Reported on 05/26/2022 09/19/20   [place] Callas, NP  aspirin EC 81 MG tablet Take 1 tablet (81 mg total) by mouth 2 (two) times daily. 09/25/19   Leandrew Koyanagi, MD  bictegravir-emtricitabine-tenofovir AF (BIKTARVY) 50-200-25 MG TABS tablet Take 1 tablet by mouth daily. 05/26/22   Carlyle Basques, MD  cetirizine (ZYRTEC) 10 MG tablet TAKE 1 TABLET(10 MG) BY MOUTH DAILY 03/02/22   Carlyle Basques, MD  Cholecalciferol (VITAMIN D3) 125 MCG (5000 UT) CAPS Take 1 capsule (5,000 Units total) by mouth daily. Patient not taking: Reported on 05/26/2022 09/24/20   [place] Callas, NP  fluconazole (DIFLUCAN) 150 MG tablet Day 1: Take 2 tablets. Day 2-7: Take 1 tablet daily. 12/27/21   Jaynee Eagles, PA-C  flunisolide (NASALIDE) 25 MCG/ACT (0.025%) SOLN Place 2 sprays into the nose 2 (two) times daily.  11/13/21   Carlyle Basques, MD  hydrocortisone 1 % ointment Apply 1 application. topically 2 (two) times daily. To right wrist 05/26/22   Carlyle Basques, MD  metoprolol succinate (TOPROL-XL) 25 MG 24 hr tablet Take 1 tablet (25 mg total) by mouth daily. Please make overdue appt with Dr. Acie Fredrickson before anymore refills. Thank you 2nd attempt 02/19/22   [name], Wonda Cheng, MD  nitroGLYCERIN (NITROSTAT) 0.4 MG SL tablet Place 1 tablet (0.4 mg total) under the tongue every 5 (five) minutes x 3 doses as needed for chest pain. 06/27/20   [name], Wonda Cheng, MD  prasugrel (EFFIENT) 10 MG TABS tablet Take 1 tablet (10 mg total) by mouth daily. PLEASE MAKE APPT WITH [name] BEFORE ANY [phone]..3rd & FINAL ATTEMPT 01/20/22   [name], Wonda Cheng, MD  PREZCOBIX 800-150 MG tablet TAKE 1 TABLET BY MOUTH DAILY WITH BREAKFAST. SWALLOW WHOLE. DO NOT CRUSH, BREAK OR CHEW TABLETS. TAKE WITH FOOD 07/22/22   Carlyle Basques, MD  rosuvastatin (CRESTOR) 10 MG tablet TAKE 1 TABLET(10 MG) BY MOUTH DAILY 11/21/21   Horald Pollen, MD  Sodium Sulfate-Mag Sulfate-KCl (SUTAB) [phone] MG TABS Take 1 kit by mouth as directed. MANUFACTURER CODES!! BIN: [id number] PCN: CN GROUP: [id number] MEMBER ID: [id number];[employer] AS SECONDARY INSURANCE ;NO PRIOR AUTHORIZATION 12/04/20   Willia Craze, NP  tiZANidine (ZANAFLEX) 2 MG tablet Take 1 tablet (2 mg total) by mouth every 8 (eight) hours as needed for muscle spasms. 01/09/22   Hazel Sams, PA-C    No Known Allergies  Patient Active  Problem List   Diagnosis Date Noted  . Chest pain 09/30/2020  . Hemorrhoid 09/30/2020  . Avascular necrosis of bone of right hip ([place]) 09/25/2019  . 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 Hyperlipidemia 04/05/2018  . Ischemic cardiomyopathy 04/05/2018  . History of acute inferior wall MI 03/24/2018  . Marijuana abuse, continuous 10/29/2016  . Moderate persistent asthma without complication 95/63/8756  . Allergic rhinitis  10/08/2016  . Anemia 05/03/2015  . Lung abscess ([place]) 04/22/2015  . Bullous emphysema ([place])   . Lobar pneumonia ([place])   . Empyema lung ([place]) 04/16/2015  . HIV (human immunodeficiency virus infection) ([place])   . Tobacco abuse 03/21/2015  . Lung mass 08/31/2014  . 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 Myalgia 04/23/2014  . AIDS ([place]) 03/22/2014  . CAP (community acquired pneumonia) 03/22/2014  . CAD (coronary artery disease) 11/13/2011    Past Medical History:  Diagnosis Date  . Anemia   . COPD (chronic obstructive pulmonary disease) ([place])   . Coronary artery disease    MI 2010 - dissection/plaque rupture in LAD tx with antiplt/antithrombotics >> relook cath with resolution (no PCI performed) // Inf STEMI 3/19: OM1 30; pRCA 100 >> DES // Echo 3/19:  mild LVH, EF 40-45, inf/inf-sept HK, trivial MR  . Eczema   . History of blood clots   . History of myocardial infarction 2010; 2019  . History of pleural empyema   . HIV infection ([place])   . Hyperlipidemia   . Hypertension   . Immune deficiency disorder ([place])   . Ischemic cardiomyopathy 04/05/2018  . Moderate persistent asthma without complication 84/12/6604  . Myocardial infarction ([place])    2009, 2019  . Pneumonia     Past Surgical History:  Procedure Laterality Date  . CARDIAC CATHETERIZATION  06/24/2009   EF 60%  . CARDIAC CATHETERIZATION  06/17/2009   EF 45%. ANTERIOR HYPOKINESIS  . CORONARY STENT INTERVENTION N/A 03/24/2018   Procedure: CORONARY STENT INTERVENTION;  Surgeon: Sherren Mocha, MD;  Location: [facility];  Service: Cardiovascular;  Laterality: N/A;  . CORONARY/GRAFT ACUTE MI REVASCULARIZATION N/A 03/24/2018   Procedure: Coronary/Graft Acute MI Revascularization;  Surgeon: Sherren Mocha, MD;  Location: [facility];  Service: Cardiovascular;  Laterality: N/A;  . LEFT HEART CATH AND CORONARY ANGIOGRAPHY N/A 03/24/2018   Procedure: LEFT HEART CATH AND CORONARY ANGIOGRAPHY;  Surgeon: Sherren Mocha,  MD;  Location: [facility];  Service: Cardiovascular;  Laterality: N/A;  . TOTAL HIP ARTHROPLASTY Right 09/25/2019   Procedure: RIGHT TOTAL HIP ARTHROPLASTY ANTERIOR APPROACH;  Surgeon: Leandrew Koyanagi, MD;  Location: [facility];  Service: Orthopedics;  Laterality: Right;  . US ECHOCARDIOGRAPHY  12/09/2009   EF 55-60%  . VIDEO ASSISTED THORACOSCOPY (VATS)/ LOBECTOMY Right 04/23/2015   Procedure: VIDEO ASSISTED THORACOSCOPY (VATS)/RIGHT upper  and right middle LOBECTOMY;  Surgeon: Ivin Poot, MD;  Location: [facility];  Service: Thoracic;  Laterality: Right;  Marland Kitchen VIDEO BRONCHOSCOPY Bilateral 09/05/2014   Procedure: VIDEO BRONCHOSCOPY WITH FLUORO;  Surgeon: Wilhelmina Mcardle, MD;  Location: [facility];  Service: Cardiopulmonary;  Laterality: Bilateral;  . VIDEO BRONCHOSCOPY Bilateral 04/10/2015   Procedure: VIDEO BRONCHOSCOPY WITH FLUORO;  Surgeon: Juanito Doom, MD;  Location: [facility];  Service: Cardiopulmonary;  Laterality: Bilateral;    Social History   Socioeconomic History  . Marital status: Single    Spouse name: Not on file  . Number of children: 0  . Years of education: Not on file  .  Highest education level: Not on file  Occupational History  . Occupation: unemployed  Tobacco Use  . Smoking status: Former    Packs/day: 0.25    Years: 17.00    Total pack years: 4.25    Types: Cigarettes, Cigars    Quit date: 03/23/2015    Years since quitting: 7.3  . Smokeless tobacco: Never  Vaping Use  . Vaping Use: Never used  Substance and Sexual Activity  . Alcohol use: Yes    Comment: occ  . Drug use: Not Currently    Frequency: 7.0 times per week    Types: Marijuana  . Sexual activity: Not Currently    Partners: Male    Comment: given condoms  Other Topics Concern  . Not on file  Social History Narrative  . Not on file   Social Determinants of Health   Financial Resource Strain: Low Risk  (09/12/2021)   Overall Financial Resource Strain (CARDIA)   . Difficulty of  Paying Living Expenses: Not hard at all  Food Insecurity: No Food Insecurity (09/12/2021)   Hunger Vital Sign   . Worried About Charity fundraiser in the Last Year: Never true   . Ran Out of Food in the Last Year: Never true  Transportation Needs: No Transportation Needs (09/12/2021)   PRAPARE - Transportation   . Lack of Transportation (Medical): No   . Lack of Transportation (Non-Medical): No  Physical Activity: Sufficiently Active (09/12/2021)   Exercise Vital Sign   . Days of Exercise per Week: 5 days   . Minutes of Exercise per Session: 30 min  Stress: Stress Concern Present (09/12/2021)   [place]   . Feeling of Stress : To some extent  Social Connections: Moderately Integrated (09/12/2021)   Social Connection and Isolation Panel [NHANES]   . Frequency of Communication with Friends and Family: More than three times a week   . Frequency of Social Gatherings with Friends and Family: More than three times a week   . Attends Religious Services: 1 to 4 times per year   . Active Member of Clubs or Organizations: No   . Attends Archivist Meetings: 1 to 4 times per year   . Marital Status: Never married  Intimate Partner Violence: Not At Risk (09/12/2021)   Humiliation, Afraid, Rape, and Kick questionnaire   . Fear of Current or Ex-Partner: No   . Emotionally Abused: No   . Physically Abused: No   . Sexually Abused: No    Family History  Problem Relation Age of Onset  . Diabetes Mother   . Eczema Mother   . Asthma Mother   . COPD Mother   . Hypertension Mother   . Hyperlipidemia Mother   . Stroke Mother   . Heart disease Mother   . Sleep apnea Mother   . Liver cancer Father   . Asthma Brother   . Diabetes Maternal Grandmother   . Heart failure Maternal Grandmother      Review of Systems  Constitutional: Negative.  Negative for chills and fever.  HENT: Negative.  Negative for congestion and sore  throat.   Respiratory: Negative.  Negative for cough and shortness of breath.   Cardiovascular: Negative.  Negative for chest pain and palpitations.  Gastrointestinal:  Negative for abdominal pain, diarrhea, nausea and vomiting.  Genitourinary: Negative.  Negative for dysuria and hematuria.  Skin: Negative.  Negative for rash.  Neurological: Negative.  Negative for dizziness  and headaches.  All other systems reviewed and are negative.  Today's Vitals   08/05/22 0931  BP: 102/64  Pulse: 68  Temp: 97.8 F (36.6 C)  TempSrc: Oral  SpO2: 96%  Weight: 127 lb 2 oz (57.7 kg)  Height: _0  (1.626 m)   Body mass index is 21.82 kg/m.   Physical Exam Vitals reviewed.  Constitutional:      Appearance: Normal appearance.  HENT:     Head: Normocephalic.     Mouth/Throat:     Mouth: Mucous membranes are moist.     Pharynx: Oropharynx is clear.  Eyes:     Extraocular Movements: Extraocular movements intact.     Conjunctiva/sclera: Conjunctivae normal.     Pupils: Pupils are equal, round, and reactive to light.  Cardiovascular:     Rate and Rhythm: Normal rate and regular rhythm.     Pulses: Normal pulses.     Heart sounds: Normal heart sounds.  Pulmonary:     Effort: Pulmonary effort is normal.     Breath sounds: Normal breath sounds.  Abdominal:     Palpations: Abdomen is soft.     Tenderness: There is no abdominal tenderness.  Musculoskeletal:     Cervical back: No tenderness.  Lymphadenopathy:     Cervical: No cervical adenopathy.  Skin:    General: Skin is warm and dry.     Capillary Refill: Capillary refill takes less than 2 seconds.     Comments: Clubbing of fingers  Neurological:     General: No focal deficit present.     Mental Status: He is alert and oriented to person, place, and time.  Psychiatric:        Mood and Affect: Mood normal.        Behavior: Behavior normal.     ASSESSMENT & PLAN: A total of 45 minutes was spent with the patient and  counseling/coordination of care regarding preparing for this visit, review of most recent office visit notes, review of most recent emergency department visit notes, review of most recent blood work results, review of most recent chest x-ray and CT scan of chest reports, review of multiple chronic medical problems and their management, review of all medications, education on nutrition, prognosis, documentation and need for follow-up.  Problem List Items Addressed This Visit       Cardiovascular and Mediastinum   CAD (coronary artery disease)    Stable.  No recent use of nitroglycerin. Continue daily baby aspirin and metoprolol succinate 25 mg daily.        Respiratory   Pulmonary nodule - Primary    Recent CT scan of chest reviewed with patient. 4 mm nodule at left apex. Needs pulmonary follow-up.  Referral placed today.      Relevant Orders   Ambulatory referral to Pulmonology   Pulmonary emphysema [facility])    Former smoker.  Still smoking marijuana.  Smoking cessation advice given. Stable.  No recent use of albuterol.      Relevant Orders   Ambulatory referral to Pulmonology     Other   HIV (human immunodeficiency virus infection) ([place])    Stable.  On medications managed by infectious disease doctor      History of acute inferior wall MI   Hyperlipidemia    Stable.  Diet and nutrition discussed. Continue rosuvastatin 10 mg daily.      Patient Instructions  Pulmonary Nodule  A pulmonary nodule is a small, round growth of tissue in the lung. A  nodule may be cancer, but most nodules are not cancer. What are the causes? Infection from a germ (bacteria, fungus, or virus), such as tuberculosis. Tissue that is cancer, such as: Cancer in the lung. Cancer that has spread to the lung from another part of the body. A growth of tissue (mass) that is not cancer. Swelling and irritation from conditions such as rheumatoid arthritis. Having blood vessels that are not normal in the  lungs. What are the signs or symptoms? Many times, there are no symptoms. If you get symptoms, they normally have another cause, such as infection. How is this treated? Treatment depends on: If your nodule is cancer or if it is not cancer. What your risk of getting cancer is. Some nodules are not cancer. If this is the case for you, you may not need treatment. Your doctor may do tests to watch the nodule for changes. If the nodule is cancer: You will need tests, such as CT and PET scans. You may need treatment. This may include: Surgery. Treatment with high-energy X-rays (radiation therapy). Medicines. Some nodules need to be taken out. You may have a procedure to have the nodule taken out. During the procedure, your doctor will make a cut (incision) into your chest and take out the part of your lung that has the nodule. Follow these instructions at home: Take over-the-counter and prescription medicines only as told by your doctor. Do not smoke or use any products that contain nicotine or tobacco. If you need help quitting, ask your doctor. Keep all follow-up visits. Contact a doctor if: You have pain in your chest, back, or shoulder. You are short of breath or have trouble breathing when you are active. You get a cough. Your voice starts to sound raspy, breathy, or strained (hoarse), and you do not know why. You feel sick or more tired than normal. You do not feel like eating. You lose weight without trying. You get chills, or you start to sweat a lot during sleep. You need two or more pillows to sleep on at night. You have: A fever and your symptoms get worse all of a sudden. A fever or symptoms for more than 2-3 days. Get help right away if: You cannot catch your breath. You have sudden chest pain. You start making high-pitched whistling sounds when you breathe, most often when you breathe out (you wheeze). You cannot stop coughing. You cough up blood or bloody mucus from your  lungs (sputum). You get dizzy or feel like you may faint. These symptoms may represent a serious problem that is an emergency. Do not wait to see if the symptoms will go away. Get medical help right away. Call your local emergency services (911 in the [place] Do not drive yourself to the hospital. Summary A pulmonary nodule is a small, round growth of tissue in the lung. Most of these nodules are not cancer. Common causes of nodules in the lung include infection, swelling and irritation, and growths that are not cancer. Treatment depends on whether the nodule is cancer or is not cancer. Treatment also depends on your risk of getting cancer. If the nodule is cancer, you will need certain tests and treatments as told by your doctor. This information is not intended to replace advice given to you by your health care provider. Make sure you discuss any questions you have with your health care provider. Document Revised: 07/03/2020 Document Reviewed: 07/03/2020 Elsevier Patient Education  [place],  MD [facility]

## 2022-08-18 ENCOUNTER — Ambulatory Visit: Payer: Self-pay

## 2022-08-18 NOTE — Patient Outreach (Signed)
  Care Coordination   Initial Visit Note   08/18/2022 Name: Rodney Walker MRN: [id number] DOB: [birth year]  Rodney Walker is a 41 y.o. year old male who sees Sagardia, Eilleen Kempf, MD for primary care. I spoke with  Rodney Walker by phone today  What matters to the patients health and wellness today?  Patient denies any care coordination or resource needs at this time    Goals Addressed             This Visit's Progress    COMPLETED: care coordination activities: no follow up required       Care Coordination Interventions: Advised patient to contact RN if care management/care coordination needs in the future Care gaps discussed          SDOH assessments and interventions completed:  Yes  SDOH Interventions Today    Flowsheet Row Most Recent Value  SDOH Interventions   Food Insecurity Interventions Intervention Not Indicated  Transportation Interventions Intervention Not Indicated        Care Coordination Interventions Activated:  Yes  Care Coordination Interventions:  Yes, provided   Follow up plan: No further intervention required.   Encounter Outcome:  Pt. Visit Completed   Kathyrn Sheriff, RN, MSN, BSN, CCM Care Coordinator [phone]

## 2022-08-18 NOTE — Progress Notes (Unsigned)
Rodney Walker Date of Birth  05-17-1981 [facility] [address]    [address] [place], [place]  [address] [phone]  Fax  [phone]    Rodney Walker is a 41 y.o.  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  He was on Plavix for a year.  He is now on Aspirin but admits to not taking it regularly.    He remains active,  He works at [employer].  He does not get any regular aerobic exercise.  November 11, 2018:  Rodney Walker is seen back today for a follow-up visit.  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  Hx of HIV, ashtma   Is admitted to the hospital in March, 2019 with an inferior wall ST segment elevation myocardial infarction.  He had atherectomy and then followed by drug-eluting stent to the proximal RCA.  He is done well.  He is not had any episodes of chest pain or shortness of breath.  He needs to have hip surgery  He is at low risk for this hip surgery  He may hold Effient for 7 days prior to the procedure. I would prefer that he continue the ASA during this time  July 31, 2020: Rodney Walker is seen today for follow-up visit.  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 Is back exercising and doing .  Walks every day .  No CP    Aug. 23, 2023 Rodney Walker is seen for follow up of his SCAD  Is on ASA and Brilinta  Had some chest pain recently   CTA of the lungs  - no evidence of PE. Trop levels were negative  No evidence of SCAD   Severe emphysema - saw Dr. Craige Cotta previously     Current Outpatient Medications on File Prior to Visit  Medication Sig Dispense Refill   albuterol (VENTOLIN HFA) 108 (90 Base) MCG/ACT inhaler Inhale 2 puffs into the lungs every 6 (six) hours as needed for wheezing or shortness of breath. 8 g 2   aspirin EC  81 MG tablet Take 1 tablet (81 mg total) by mouth 2 (two) times daily. 84 tablet 0   bictegravir-emtricitabine-tenofovir AF (BIKTARVY) 50-200-25 MG TABS tablet Take 1 tablet by mouth daily. 30 tablet 11   cetirizine (ZYRTEC) 10 MG tablet TAKE 1 TABLET(10 MG) BY MOUTH DAILY 30 tablet 11   hydrocortisone 1 % ointment Apply 1 application. topically 2 (two) times daily. To right wrist 30 g 0   metoprolol succinate (TOPROL-XL) 25 MG 24 hr tablet Take 1 tablet (25 mg total) by mouth daily. Please make overdue appt with Dr. Elease Hashimoto before anymore refills. Thank you 2nd attempt 15 tablet 0   nitroGLYCERIN (NITROSTAT) 0.4 MG SL tablet Place 1 tablet (0.4 mg total) under the tongue every 5 (five) minutes x 3 doses as needed for chest pain. 25 tablet 0   PREZCOBIX 800-150 MG tablet TAKE 1 TABLET BY MOUTH DAILY WITH BREAKFAST. SWALLOW WHOLE. DO NOT CRUSH, BREAK OR CHEW TABLETS. TAKE WITH FOOD 30 tablet 5   rosuvastatin (CRESTOR) 10 MG tablet TAKE 1 TABLET(10 MG) BY MOUTH DAILY 90 tablet 3   prasugrel (EFFIENT) 10 MG TABS tablet Take 1 tablet (10 mg total) by mouth daily. PLEASE MAKE APPT WITH [facility] BEFORE ANY [phone]..3rd & FINAL ATTEMPT (  Patient not taking: Reported on 08/19/2022) 15 tablet 0   No current facility-administered medications on file prior to visit.    No Known Allergies  Past Medical History:  Diagnosis Date   Anemia    COPD (chronic obstructive pulmonary disease) (HCC)    Coronary artery disease    MI 2010 - dissection/plaque rupture in LAD tx with antiplt/antithrombotics >> relook cath with resolution (no PCI performed) // Inf STEMI 3/19: OM1 30; pRCA 100 >> DES // Echo 3/19:  mild LVH, EF 40-45, inf/inf-sept HK, trivial MR   Eczema    History of blood clots    History of myocardial infarction 2010; 2019   History of pleural empyema    HIV infection (HCC)    Hyperlipidemia    Hypertension    Immune deficiency disorder (HCC)    Ischemic cardiomyopathy 04/05/2018    Moderate persistent asthma without complication 10/29/2016   Myocardial infarction (HCC)    2009, 2019   Pneumonia     Past Surgical History:  Procedure Laterality Date   CARDIAC CATHETERIZATION  06/24/2009   EF 60%   CARDIAC CATHETERIZATION  06/17/2009   EF 45%. ANTERIOR HYPOKINESIS   CORONARY STENT INTERVENTION N/A 03/24/2018   Procedure: CORONARY STENT INTERVENTION;  Surgeon: Tonny Bollman, MD;  Location[facility] INVASIVE CV LAB;  Service: Cardiovascular;  Laterality: N/A;   CORONARY/GRAFT ACUTE MI REVASCULARIZATION N/A 03/24/2018   Procedure: Coronary/Graft Acute MI Revascularization;  Surgeon: Tonny Bollman, MD;  Location: [facility] INVASIVE CV LAB;  Service: Cardiovascular;  Laterality: N/A;   LEFT HEART CATH AND CORONARY ANGIOGRAPHY N/A 03/24/2018   Procedure: LEFT HEART CATH AND CORONARY ANGIOGRAPHY;  Surgeon: Tonny Bollman, MD;  Location: [facility] INVASIVE CV LAB;  Service: Cardiovascular;  Laterality: N/A;   TOTAL HIP ARTHROPLASTY Right 09/25/2019   Procedure: RIGHT TOTAL HIP ARTHROPLASTY ANTERIOR APPROACH;  Surgeon: Tarry Kos, MD;  Location: [facility] OR;  Service: Orthopedics;  Laterality: Right;   US ECHOCARDIOGRAPHY  12/09/2009   EF 55-60%   VIDEO ASSISTED THORACOSCOPY (VATS)/ LOBECTOMY Right 04/23/2015   Procedure: VIDEO ASSISTED THORACOSCOPY (VATS)/RIGHT upper  and right middle LOBECTOMY;  Surgeon: Kerin Perna, MD;  Location: [facility] OR;  Service: Thoracic;  Laterality: Right;   VIDEO BRONCHOSCOPY Bilateral 09/05/2014   Procedure: VIDEO BRONCHOSCOPY WITH FLUORO;  Surgeon: Merwyn Katos, MD;  Location: [facility] ENDOSCOPY;  Service: Cardiopulmonary;  Laterality: Bilateral;   VIDEO BRONCHOSCOPY Bilateral 04/10/2015   Procedure: VIDEO BRONCHOSCOPY WITH FLUORO;  Surgeon: Lupita Leash, MD;  Location: [facility] ENDOSCOPY;  Service: Cardiopulmonary;  Laterality: Bilateral;    Social History   Tobacco Use  Smoking Status Former   Packs/day: 0.25   Years: 17.00   Total pack years: 4.25   Types: Cigarettes,  Cigars   Quit date: 03/23/2015   Years since quitting: 7.4  Smokeless Tobacco Never    Social History   Substance and Sexual Activity  Alcohol Use Yes   Comment: occ    Family History  Problem Relation Age of Onset   Diabetes Mother    Eczema Mother    Asthma Mother    COPD Mother    Hypertension Mother    Hyperlipidemia Mother    Stroke Mother    Heart disease Mother    Sleep apnea Mother    Liver cancer Father    Asthma Brother    Diabetes Maternal Grandmother    Heart failure Maternal Grandmother     Reviw of Systems:  Reviewed in the HPI.  All other  systems are negative.  Physical Exam: Blood pressure 102/64, pulse 76, height 5\' 4"  (1.626 m), weight 130 lb (59 kg), SpO2 96 %.       GEN:  Well nourished, well developed in no acute distress HEENT: Normal NECK: No JVD; No carotid bruits LYMPHATICS: No lymphadenopathy CARDIAC: RRR , no murmurs, rubs, gallops RESPIRATORY:  Clear to auscultation without rales, wheezing or rhonchi  ABDOMEN: Soft, non-tender, non-distended MUSCULOSKELETAL:  No edema; No deformity  SKIN: Warm and dry NEUROLOGIC:  Alert and oriented x 3     ECG:   July 29, 2022: Normal sinus rhythm at 60.  Old anterior wall myocardial infarction.  No ST or T wave changes.       Assessment / Plan:   1.  CAD: July 31, 2022 is doing well.  He has had 2 separate myocardial infarctions each 1 in different vessels.  In his last procedure Dr. Christiane Walker suggested that we continue with long-term dual Antley platelet therapy as long as is tolerated.  We will continue aspirin and prasugrel.    He had an episode of chest pain and went to the emergency room in early August.  Troponins were negative.  EKG was unchanged.  CT scan revealed no evidence of pulmonary embolus.  He was found to have severe emphysema.  Continues to smoke marijuana on a regular basis.  I encouraged him to see Dr. September for further evaluation and treatment.  No changes in his medications  at this time. .   2. Right hip avascular necrosis:       Craige Cotta, MD  08/19/2022 9:58 AM    [facility] [address] [place],  [address] [place], [place]  [place] Phone: [phone]; Fax: [phone]

## 2022-08-19 ENCOUNTER — Ambulatory Visit (INDEPENDENT_AMBULATORY_CARE_PROVIDER_SITE_OTHER): Payer: Medicare Other | Admitting: Cardiovascular Disease

## 2022-08-19 ENCOUNTER — Encounter: Payer: Self-pay | Admitting: Cardiovascular Disease

## 2022-08-19 VITALS — BP 102/64 | HR 76 | Ht 64.0 in | Wt 130.0 lb

## 2022-08-19 DIAGNOSIS — E785 Hyperlipidemia, unspecified: Secondary | ICD-10-CM | POA: Diagnosis not present

## 2022-08-19 DIAGNOSIS — I2542 Coronary artery dissection: Secondary | ICD-10-CM | POA: Diagnosis not present

## 2022-08-19 DIAGNOSIS — J439 Emphysema, unspecified: Secondary | ICD-10-CM

## 2022-08-19 NOTE — Patient Instructions (Signed)
Medication Instructions:  Your physician recommends that you continue on your current medications as directed. Please refer to the Current Medication list given to you today.  *If you need a refill on your cardiac medications before your next appointment, please call your pharmacy*  Follow-Up: At [facility], you and your health needs are our priority.  As part of our continuing mission to provide you with exceptional heart care, we have created designated Provider Care Teams.  These Care Teams include your primary Cardiologist (physician) and Advanced Practice Providers (APPs -  Physician Assistants and Nurse Practitioners) who all work together to provide you with the care you need, when you need it.  Your next appointment:   1 year(s)  The format for your next appointment:   In Person  Provider:   Kristeen Miss, MD     Other Instructions Referral has been sent to Dr. Craige Cotta with [facility]

## 2022-08-24 ENCOUNTER — Encounter: Payer: Self-pay | Admitting: Internal Medicine

## 2022-08-24 ENCOUNTER — Ambulatory Visit (INDEPENDENT_AMBULATORY_CARE_PROVIDER_SITE_OTHER): Payer: Medicare Other | Admitting: Internal Medicine

## 2022-08-24 ENCOUNTER — Other Ambulatory Visit (HOSPITAL_COMMUNITY)
Admission: RE | Admit: 2022-08-24 | Discharge: 2022-08-24 | Disposition: A | Discharge status: Home / Self Care | Payer: Medicare Other | Source: Ambulatory Visit | Attending: Internal Medicine | Admitting: Internal Medicine

## 2022-08-24 ENCOUNTER — Other Ambulatory Visit: Payer: Self-pay

## 2022-08-24 VITALS — BP 105/69 | HR 73 | Temp 97.9°F | Ht 65.0 in | Wt 132.0 lb

## 2022-08-24 DIAGNOSIS — Z23 Encounter for immunization: Secondary | ICD-10-CM

## 2022-08-24 DIAGNOSIS — R3915 Urgency of urination: Secondary | ICD-10-CM

## 2022-08-24 DIAGNOSIS — B20 Human immunodeficiency virus [HIV] disease: Secondary | ICD-10-CM

## 2022-08-24 MED ORDER — DOXYCYCLINE HYCLATE 100 MG PO TABS: 200.0000 mg | ORAL_TABLET | Freq: Once | ORAL | 0 refills | Status: DC | PRN

## 2022-08-24 NOTE — Progress Notes (Signed)
RFV: hiv disease  Patient ID: Rodney Walker, male   DOB: [birth year], 41 y.o.   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 Doing well otherwise with biktarvy. No recent illnesses  Urinary urgency in ROS  Outpatient Encounter Medications as of 08/24/2022  Medication Sig   albuterol (VENTOLIN HFA) 108 (90 Base) MCG/ACT inhaler Inhale 2 puffs into the lungs every 6 (six) hours as needed for wheezing or shortness of breath.   aspirin EC 81 MG tablet Take 1 tablet (81 mg total) by mouth 2 (two) times daily.   bictegravir-emtricitabine-tenofovir AF (BIKTARVY) 50-200-25 MG TABS tablet Take 1 tablet by mouth daily.   cetirizine (ZYRTEC) 10 MG tablet TAKE 1 TABLET(10 MG) BY MOUTH DAILY   hydrocortisone 1 % ointment Apply 1 application. topically 2 (two) times daily. To right wrist   metoprolol succinate (TOPROL-XL) 25 MG 24 hr tablet Take 1 tablet (25 mg total) by mouth daily. Please make overdue appt with Dr. Elease Hashimoto before anymore refills. Thank you 2nd attempt   nitroGLYCERIN (NITROSTAT) 0.4 MG SL tablet Place 1 tablet (0.4 mg total) under the tongue every 5 (five) minutes x 3 doses as needed for chest pain.   PREZCOBIX 800-150 MG tablet TAKE 1 TABLET BY MOUTH DAILY WITH BREAKFAST. SWALLOW WHOLE. DO NOT CRUSH, BREAK OR CHEW TABLETS. TAKE WITH FOOD   rosuvastatin (CRESTOR) 10 MG tablet TAKE 1 TABLET(10 MG) BY MOUTH DAILY   prasugrel (EFFIENT) 10 MG TABS tablet Take 1 tablet (10 mg total) by mouth daily. PLEASE MAKE APPT WITH [name] BEFORE ANY [phone]..3rd & FINAL ATTEMPT (Patient not taking: Reported on 08/19/2022)   No facility-administered encounter medications on file as of 08/24/2022.     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 He appears well-developed and well-nourished. No distress.  HENT:  Mouth/Throat: Oropharynx is clear and moist. No oropharyngeal exudate.  Cardiovascular: Normal rate, regular rhythm and normal heart sounds. Exam reveals no gallop and no friction rub.  No murmur heard.  Pulmonary/Chest: Effort normal and breath sounds normal. No respiratory distress. He has no wheezes.  Abdominal: Soft. Bowel sounds are normal. He exhibits no distension. There is no tenderness.  Lymphadenopathy:  He has no cervical adenopathy.  Neurological: He is alert and oriented to person, place, and time.  Skin: Skin is warm and dry. No rash noted. No erythema.  Psychiatric: He has a normal mood and affect. His behavior is normal.   Lab Results  Component Value Date   CD4TCELL 22 (L) 05/26/2022   Lab Results  Component Value Date   CD4TABS 789 05/26/2022   CD4TABS 764 10/30/2021   CD4TABS 1,021 08/12/2021   Lab Results  Component Value Date   HIV1RNAQUANT <20 (H)  05/26/2022   No results found for: "HEPBSAB" Lab Results  Component Value Date   LABRPR REACTIVE (A) 05/26/2022    CBC Lab Results  Component Value Date   WBC 11.7 (H) 07/28/2022   RBC 4.41 07/28/2022   HGB 15.4 07/28/2022   HCT 41.1 07/28/2022   PLT 219 07/28/2022   MCV 93.2 07/28/2022   MCH 34.9 (H) 07/28/2022   MCHC 37.5 (H) 07/28/2022   RDW 12.8 07/28/2022   LYMPHSABS 4.8 (H) 07/28/2022   MONOABS 1.0 07/28/2022   EOSABS 0.1 07/28/2022    BMET Lab Results  Component Value Date   NA 140 07/28/2022   K 3.9 07/28/2022   CL 106 07/28/2022   CO2 27 07/28/2022   GLUCOSE 96 07/28/2022   BUN 13 07/28/2022   CREATININE 1.11 07/28/2022   CALCIUM 9.3 07/28/2022   GFRNONAA >60 07/28/2022   GFRAA 97 12/23/2020      Assessment and Plan  HIV disease= continue on biktarvy.   Long term medication management = cr is stable. No need for any change in his regimen  High risk sex = will give prescription  10 day -doxy for post exposure proph. 2 tabs within 24- up to 72hrs  Urine urgency = will do studies to see if ua, or STI  Health maintenance = Will recommend flu and covid vaccine once available

## 2022-08-24 NOTE — Patient Instructions (Signed)
I will give you doxy for post exposure proph. 2 tabs within 24- up to 72hrs

## 2022-08-25 LAB — URINE CYTOLOGY ANCILLARY ONLY
Chlamydia: NEGATIVE
Comment: NEGATIVE
Comment: NORMAL
Neisseria Gonorrhea: NEGATIVE

## 2022-08-27 LAB — PROTEIN / CREATININE RATIO, URINE
Creatinine, Urine: 54 mg/dL (ref 20–320)
Protein/Creat Ratio: 370 mg/g creat — ABNORMAL HIGH (ref 25–148)
Protein/Creatinine Ratio: 0.37 mg/mg creat — ABNORMAL HIGH (ref 0.025–0.148)
Total Protein, Urine: 20 mg/dL (ref 5–25)

## 2022-08-27 LAB — URINALYSIS, ROUTINE W REFLEX MICROSCOPIC
Bacteria, UA: NONE SEEN /HPF
Bilirubin Urine: NEGATIVE
Glucose, UA: NEGATIVE
Hgb urine dipstick: NEGATIVE
Hyaline Cast: NONE SEEN /LPF
Ketones, ur: NEGATIVE
Leukocytes,Ua: NEGATIVE
Nitrite: NEGATIVE
RBC / HPF: NONE SEEN /HPF (ref 0–2)
Specific Gravity, Urine: 1.008 (ref 1.001–1.035)
Squamous Epithelial / HPF: NONE SEEN /HPF (ref <=5)
WBC, UA: NONE SEEN /HPF (ref 0–5)
pH: 6 (ref 5.0–8.0)

## 2022-08-27 LAB — URINE CULTURE
MICRO NUMBER:: 13842287
Result:: NO GROWTH
SPECIMEN QUALITY:: ADEQUATE

## 2022-08-27 LAB — RPR: RPR Ser Ql: REACTIVE — AB

## 2022-08-27 LAB — RPR TITER: RPR Titer: 1:16 {titer} — ABNORMAL HIGH

## 2022-08-27 LAB — MICROSCOPIC MESSAGE

## 2022-08-27 LAB — FLUORESCENT TREPONEMAL AB(FTA)-IGG-BLD: Fluorescent Treponemal ABS: REACTIVE — AB

## 2022-08-31 NOTE — Progress Notes (Signed)
Synopsis: Rodney Walker has HIV AIDS and was hospitalized in the fall of 2015 in the setting of a right upper lobe lung mass with mediastinal lymphadenopathy. The mass was visible on bronchoscopy and multiple endobronchial biopsies were taken as were cultures including AFB culture. Biopsy results showed nonspecific inflammatory changes in the AFB culture was negative. He ultimately underwent an right upper lobe decortication and lobectomy. Pathology showed necrotizing granuloma in the right upper lobe mass and mediastinal lymph nodes with negative special stains for infectious etiologies.  Referred in September 2023 for pulmonary nodule in background of emphysema.  Prior history of right upper lobectomy.  HIV positive, followed by infectious diseases clinic.  Subjective:   PATIENT ID: Rodney Walker GENDER: male DOB: [birth year], MRN: [id number]   HPI  Chief Complaint  Patient presents with   Consult    Pt is here due to recent CT.  Pt denies any current complaints of cough, SOB, or chest discomfort.    Rodney Walker had some chest pain a few weeks ago and went to ER and was found to have emphysema and a nodule in his lungs, so he was referred back to [place] for further evaluation.  He continues to have allergic rhinitis and coughs up some mucus.  He coughs up mucus a few times a week, it's worse with certain weather patterns like if the weather changes from cold to hot or vice versa he will get cough with mucus production.  He says that if he is in a cold area he'll note it.  He hasn't been hospitalized in the last two  years.    He says that he stays very physically active and doesn't get short of breath.   He smokes marijuana daily via blunt probably 5-6 times a day.     Record review: July 2023 internal medicine notes reviewed where the patient was noted to have a recent ER visit, CT scanning of the chest showed emphysema, 4 mm right upper lobe nodule.  Referred to our clinic for  evaluation of the same  Past Medical History:  Diagnosis Date   Anemia    COPD (chronic obstructive pulmonary disease) ([place])    Coronary artery disease    MI 2010 - dissection/plaque rupture in LAD tx with antiplt/antithrombotics >> relook cath with resolution (no PCI performed) // Inf STEMI 3/19: OM1 30; pRCA 100 >> DES // Echo 3/19:  mild LVH, EF 40-45, inf/inf-sept HK, trivial MR   Eczema    History of blood clots    History of myocardial infarction 2010; 2019   History of pleural empyema    HIV infection ([place])    Hyperlipidemia    Hypertension    Immune deficiency disorder ([place])    Ischemic cardiomyopathy 04/05/2018   Moderate persistent asthma without complication 86/02/8176   Myocardial infarction ([place])    2009, 2019   Pneumonia      Family History  Problem Relation Age of Onset   Diabetes Mother    Eczema Mother    Asthma Mother    COPD Mother    Hypertension Mother    Hyperlipidemia Mother    Stroke Mother    Heart disease Mother    Sleep apnea Mother    Liver cancer Father    Asthma Brother    Diabetes Maternal Grandmother    Heart failure Maternal Grandmother      Social History   Socioeconomic History   Marital status: Single    Spouse name: Not on  file   Number of children: 0   Years of education: Not on file   Highest education level: Not on file  Occupational History   Occupation: unemployed  Tobacco Use   Smoking status: Former    Packs/day: 0.25    Years: 17.00    Total pack years: 4.25    Types: Cigarettes, Cigars    Quit date: 03/23/2015    Years since quitting: 7.4   Smokeless tobacco: Never  Vaping Use   Vaping Use: Never used  Substance and Sexual Activity   Alcohol use: Yes    Comment: occasional   Drug use: Yes    Frequency: 7.0 times per week    Types: Marijuana    Comment: daily   Sexual activity: Not Currently    Partners: Male    Comment: accepted condoms  Other Topics Concern   Not on file  Social History Narrative    Not on file   Social Determinants of Health   Financial Resource Strain: Low Risk  (09/12/2021)   Overall Financial Resource Strain (CARDIA)    Difficulty of Paying Living Expenses: Not hard at all  Food Insecurity: No Food Insecurity (08/18/2022)   Hunger Vital Sign    Worried About Running Out of Food in the Last Year: Never true    [name] in the Last Year: Never true  Transportation Needs: No Transportation Needs (08/18/2022)   PRAPARE - Hydrologist (Medical): No    Lack of Transportation (Non-Medical): No  Physical Activity: Sufficiently Active (09/12/2021)   Exercise Vital Sign    Days of Exercise per Week: 5 days    Minutes of Exercise per Session: 30 min  Stress: Stress Concern Present (09/12/2021)   [place]    Feeling of Stress : To some extent  Social Connections: Moderately Integrated (09/12/2021)   Social Connection and Isolation Panel [NHANES]    Frequency of Communication with Friends and Family: More than three times a week    Frequency of Social Gatherings with Friends and Family: More than three times a week    Attends Religious Services: 1 to 4 times per year    Active Member of [employer] or Organizations: No    Attends Archivist Meetings: 1 to 4 times per year    Marital Status: Never married  Intimate Partner Violence: Not At Risk (09/12/2021)   Humiliation, Afraid, Rape, and Kick questionnaire    Fear of Current or Ex-Partner: No    Emotionally Abused: No    Physically Abused: No    Sexually Abused: No     No Known Allergies   Outpatient Medications Prior to Visit  Medication Sig Dispense Refill   albuterol (VENTOLIN HFA) 108 (90 Base) MCG/ACT inhaler Inhale 2 puffs into the lungs every 6 (six) hours as needed for wheezing or shortness of breath. 8 g 2   aspirin EC 81 MG tablet Take 1 tablet (81 mg total) by mouth 2 (two) times daily. 84 tablet 0    bictegravir-emtricitabine-tenofovir AF (BIKTARVY) 50-200-25 MG TABS tablet Take 1 tablet by mouth daily. 30 tablet 11   cetirizine (ZYRTEC) 10 MG tablet TAKE 1 TABLET(10 MG) BY MOUTH DAILY 30 tablet 11   hydrocortisone 1 % ointment Apply 1 application. topically 2 (two) times daily. To right wrist 30 g 0   metoprolol succinate (TOPROL-XL) 25 MG 24 hr tablet Take 1 tablet (25 mg total) by  mouth daily. Please make overdue appt with Dr. Acie Fredrickson before anymore refills. Thank you 2nd attempt 15 tablet 0   nitroGLYCERIN (NITROSTAT) 0.4 MG SL tablet Place 1 tablet (0.4 mg total) under the tongue every 5 (five) minutes x 3 doses as needed for chest pain. 25 tablet 0   PREZCOBIX 800-150 MG tablet TAKE 1 TABLET BY MOUTH DAILY WITH BREAKFAST. SWALLOW WHOLE. DO NOT CRUSH, BREAK OR CHEW TABLETS. TAKE WITH FOOD 30 tablet 5   doxycycline (VIBRA-TABS) 100 MG tablet Take 2 tablets (200 mg total) by mouth once as needed for up to 1 dose. Up to 72hrs after unprotected sex 20 tablet 0   prasugrel (EFFIENT) 10 MG TABS tablet Take 1 tablet (10 mg total) by mouth daily. PLEASE MAKE APPT WITH [name] BEFORE ANY [phone]..3rd & FINAL ATTEMPT (Patient not taking: Reported on 08/19/2022) 15 tablet 0   rosuvastatin (CRESTOR) 10 MG tablet TAKE 1 TABLET(10 MG) BY MOUTH DAILY 90 tablet 3   No facility-administered medications prior to visit.    ROS Gen: Denies fever, chills, weight change, fatigue, night sweats HEENT: Denies blurred vision, double vision, hearing loss, tinnitus, sinus congestion, rhinorrhea, sore throat, neck stiffness, dysphagia PULM: per HPI CV: Denies chest pain, edema, orthopnea, paroxysmal nocturnal dyspnea, palpitations GI: Denies abdominal pain, nausea, vomiting, diarrhea, hematochezia, melena, constipation, change in bowel habits GU: Denies dysuria, hematuria, polyuria, oliguria, urethral discharge Endocrine: Denies hot or cold intolerance, polyuria, polyphagia or appetite change Derm:  Denies rash, dry skin, scaling or peeling skin change Heme: Denies easy bruising, bleeding, bleeding gums Neuro: Denies headache, numbness, weakness, slurred speech, loss of memory or consciousness    Objective:  Physical Exam   Vitals:   09/01/22 1506  BP: (!) 112/58  Pulse: 96  SpO2: 95%  Weight: 127 lb 6.4 oz (57.8 kg)  Height: 5' 5"  (1.651 m)    Gen: well appearing, no acute distress HENT: NCAT, OP clear, neck supple without masses Eyes: PERRL, EOMi Lymph: no cervical lymphadenopathy PULM: CTA B CV: RRR, no mgr, no JVD GI: BS+, soft, nontender, no hsm Derm: no rash or skin breakdown MSK: normal bulk and tone Neuro: A&Ox4, CN II-XII intact, strength 5/5 in all 4 extremities Psyche: normal mood and affect    CBC    Component Value Date/Time   WBC 11.7 (H) 07/28/2022 2101   RBC 4.41 07/28/2022 2101   HGB 15.4 07/28/2022 2101   HCT 41.1 07/28/2022 2101   PLT 219 07/28/2022 2101   MCV 93.2 07/28/2022 2101   MCH 34.9 (H) 07/28/2022 2101   MCHC 37.5 (H) 07/28/2022 2101   RDW 12.8 07/28/2022 2101   LYMPHSABS 4.8 (H) 07/28/2022 2101   MONOABS 1.0 07/28/2022 2101   EOSABS 0.1 07/28/2022 2101   BASOSABS 0.0 07/28/2022 2101     Chest imaging: July 28, 2022 CT angiogram chest showed no pulmonary embolism, moderate to severe bullous emphysema, status post right upper lobectomy, 4 mm left upper lobe nodule, bronchiectasis in right middle lobe.  PFT: 2017 spirometry ratio 64% predicted, FEV1 2.49 L 89% predicted, 23% change with bronchodilator.  Labs: 2015 Alpha 1 140 (normal)  Path: 03/2015 right upper lobectomy Lung, resection (segmental or lobe), Right upper lobe and middle lobe - NECROTIZING GRANULOMATOUS INFLAMMATION, SEE COMMENT. - BRONCHOVASCULAR MARGIN, NEGATIVE FOR ATYPIA OR MALIGNANCY. - TWO LYMPH NODES, NEGATIVE FOR TUMOR. - ONE LYMPH NODE, POSITIVE FOR NECROTIZING GRANULOMATOUS INFLAMMATION. GMS, AFB, and PAS stains were performed on representative  sections from lung and lymph node (blocks  1D and 61M). Although the stains demonstrate no evidence of yeast, fungi, or Mycobacterium, an infectious etiology remains in the differential.  Echo:  Heart Catheterization:       Assessment & Plan:   Centrilobular emphysema ([place]) - Plan: Pulmonary function test, Alpha-1 antitrypsin phenotype, Ambulatory referral to Pulmonology  Lung mass - Plan: Ambulatory referral to Pulmonology, Novel Coronavirus, NAA ([facility])  Currently asymptomatic HIV infection, with history of HIV-related illness [facility])  Discussion: 41 year old male returns to clinic again with a nearly identical presentation to when I first met him 7 years ago: Right lung mass, surrounding emphysema, ongoing marijuana use.  Previously when he required a right upper lobectomy he had granulomatous inflammation noted, no malignancy, no evidence of infection.  Considering the clinical scenario infectious diseases and I felt that this was most likely an infectious or inflammatory process.  The differential diagnosis include sarcoidosis, however this would be an atypical presentation and the much more likely would be a nontuberculous Mycobacterium versus fungal related to his ongoing marijuana use.  He also has severe emphysema which has progressed significantly since when I first met him.  This is undoubtedly due to his ongoing daily, multiple times a day marijuana use.  We talked today about various ways that he could try to change his regimen to keep this from getting worse.  Plan: Right middle lobe mass seen on CT scan of the chest: We will arrange for a bronchoscopy on September 18th 2023 at [facility], we will call you with details regarding this  Emphysema: Check alpha-1 antitrypsin Check lung function testing Try to stop smoking altogether  We will see you back in 6 to 8 weeks to go over the results of the bronchoscopy or sooner if needed  Immunizations: Immunization  History  Administered Date(s) Administered   HPV 9-valent 08/08/2018, 09/05/2018, 02/03/2019   Hepatitis A, Adult 09/26/2014, 03/14/2015   Hepatitis B, adult 09/26/2014, 01/23/2015, 03/14/2015   Influenza,inj,Quad PF,6+ Mos 09/26/2014, 10/16/2015, 10/08/2016, 10/06/2017, 11/07/2018, 10/02/2019, 09/19/2020, 11/13/2021, 08/24/2022   Meningococcal Mcv4o 08/08/2018, 11/07/2018   PFIZER Comirnaty(Gray Top)Covid-19 Tri-Sucrose Vaccine 08/27/2021   PFIZER(Purple Top)SARS-COV-2 Vaccination 04/03/2020, 04/24/2020, 11/08/2020   PNEUMOCOCCAL CONJUGATE-20 02/19/2022   Pneumococcal Conjugate-13 05/05/2018   Pneumococcal Polysaccharide-23 09/26/2014   Tdap 10/28/2016     Current Outpatient Medications:    albuterol (VENTOLIN HFA) 108 (90 Base) MCG/ACT inhaler, Inhale 2 puffs into the lungs every 6 (six) hours as needed for wheezing or shortness of breath., Disp: 8 g, Rfl: 2   aspirin EC 81 MG tablet, Take 1 tablet (81 mg total) by mouth 2 (two) times daily., Disp: 84 tablet, Rfl: 0   bictegravir-emtricitabine-tenofovir AF (BIKTARVY) 50-200-25 MG TABS tablet, Take 1 tablet by mouth daily., Disp: 30 tablet, Rfl: 11   cetirizine (ZYRTEC) 10 MG tablet, TAKE 1 TABLET(10 MG) BY MOUTH DAILY, Disp: 30 tablet, Rfl: 11   hydrocortisone 1 % ointment, Apply 1 application. topically 2 (two) times daily. To right wrist, Disp: 30 g, Rfl: 0   metoprolol succinate (TOPROL-XL) 25 MG 24 hr tablet, Take 1 tablet (25 mg total) by mouth daily. Please make overdue appt with Dr. Acie Fredrickson before anymore refills. Thank you 2nd attempt, Disp: 15 tablet, Rfl: 0   nitroGLYCERIN (NITROSTAT) 0.4 MG SL tablet, Place 1 tablet (0.4 mg total) under the tongue every 5 (five) minutes x 3 doses as needed for chest pain., Disp: 25 tablet, Rfl: 0   PREZCOBIX 800-150 MG tablet, TAKE 1 TABLET BY MOUTH DAILY WITH BREAKFAST. SWALLOW  WHOLE. DO NOT CRUSH, BREAK OR CHEW TABLETS. TAKE WITH FOOD, Disp: 30 tablet, Rfl: 5

## 2022-09-01 ENCOUNTER — Ambulatory Visit (INDEPENDENT_AMBULATORY_CARE_PROVIDER_SITE_OTHER): Payer: Medicare Other | Admitting: Pulmonary Disease

## 2022-09-01 ENCOUNTER — Encounter: Payer: Self-pay | Admitting: Pulmonary Disease

## 2022-09-01 VITALS — BP 112/58 | HR 96 | Ht 65.0 in | Wt 127.4 lb

## 2022-09-01 DIAGNOSIS — B20 Human immunodeficiency virus [HIV] disease: Secondary | ICD-10-CM | POA: Diagnosis not present

## 2022-09-01 DIAGNOSIS — R918 Other nonspecific abnormal finding of lung field: Secondary | ICD-10-CM

## 2022-09-01 DIAGNOSIS — J432 Centrilobular emphysema: Secondary | ICD-10-CM | POA: Diagnosis not present

## 2022-09-01 NOTE — Patient Instructions (Signed)
Right middle lobe mass seen on CT scan of the chest: We will arrange for a bronchoscopy on September 18th 2023 at [facility], we will call you with details regarding this  Emphysema: Check alpha-1 antitrypsin Check lung function testing Try to stop smoking altogether  We will see you back in 6 to 8 weeks to go over the results of the bronchoscopy or sooner if needed

## 2022-09-11 ENCOUNTER — Other Ambulatory Visit: Payer: Medicare Other

## 2022-09-11 DIAGNOSIS — R918 Other nonspecific abnormal finding of lung field: Secondary | ICD-10-CM

## 2022-09-13 LAB — SPECIMEN STATUS REPORT

## 2022-09-13 LAB — NOVEL CORONAVIRUS, NAA: SARS-CoV-2, NAA: NOT DETECTED

## 2022-09-14 ENCOUNTER — Telehealth: Payer: Self-pay | Admitting: Pulmonary Disease

## 2022-09-14 ENCOUNTER — Encounter (HOSPITAL_COMMUNITY): Payer: Self-pay

## 2022-09-14 ENCOUNTER — Encounter: Payer: Self-pay | Admitting: Pulmonary Disease

## 2022-09-14 ENCOUNTER — Ambulatory Visit (HOSPITAL_COMMUNITY): Admit: 2022-09-14 | Payer: Medicare Other | Admitting: Pulmonary Disease

## 2022-09-14 SURGERY — VIDEO BRONCHOSCOPY WITHOUT FLUORO
Anesthesia: General

## 2022-09-14 NOTE — Telephone Encounter (Signed)
Dr Lake Bells, this pt called to get her bronch rescheduled. She was set for 09/24/22 at [facility]. Please provide any dates you can do the procedure and route to procedure pool. Thanks!

## 2022-09-18 ENCOUNTER — Ambulatory Visit (INDEPENDENT_AMBULATORY_CARE_PROVIDER_SITE_OTHER): Payer: Medicare Other

## 2022-09-18 VITALS — Ht 64.0 in | Wt 130.0 lb

## 2022-09-18 DIAGNOSIS — Z0000 Encounter for general adult medical examination without abnormal findings: Secondary | ICD-10-CM | POA: Diagnosis not present

## 2022-09-18 DIAGNOSIS — Z0100 Encounter for examination of eyes and vision without abnormal findings: Secondary | ICD-10-CM

## 2022-09-18 NOTE — Patient Instructions (Signed)
Rodney Walker , Thank you for taking time to come for your Medicare Wellness Visit. I appreciate your ongoing commitment to your health goals. Please review the following plan we discussed and let me know if I can assist you in the future.   These are the goals we discussed:  Goals       Patient Stated (pt-stated)      My goal is to tone up more.        This is a list of the screening recommended for you and due dates:  Health Maintenance  Topic Date Due   COVID-19 Vaccine (5 - Pfizer risk series) 10/22/2021   Tetanus Vaccine  10/28/2026   Flu Shot  Completed   HPV Vaccine  Completed   Hepatitis C Screening: USPSTF Recommendation to screen - Ages 18-79 yo.  Completed   HIV Screening  Completed    Advanced directives: Advance directive discussed with you today. I have provided a copy for you to complete at home and have notarized. Once this is complete please bring a copy in to our office so we can scan it into your chart.   Conditions/risks identified: Aim for 30 minutes of exercise or brisk walking, 6-8 glasses of water, and 5 servings of fruits and vegetables each day.   Next appointment: Follow up in one year for your annual wellness visit   Preventive Care 39-82 Years Old, Male Preventive care refers to lifestyle choices and visits with your health care provider that can promote health and wellness. Preventive care visits are also called wellness exams. What can I expect for my preventive care visit? Counseling During your preventive care visit, your health care provider may ask about your: Medical history, including: Past medical problems. Family medical history. Current health, including: Emotional well-being. Home life and relationship well-being. Sexual activity. Lifestyle, including: Alcohol, nicotine or tobacco, and drug use. Access to firearms. Diet, exercise, and sleep habits. Safety issues such as seatbelt and bike helmet use. Sunscreen use. Work and work  Astronomer. Physical exam Your health care provider may check your: Height and weight. These may be used to calculate your BMI (body mass index). BMI is a measurement that tells if you are at a healthy weight. Waist circumference. This measures the distance around your waistline. This measurement also tells if you are at a healthy weight and may help predict your risk of certain diseases, such as type 2 diabetes and high blood pressure. Heart rate and blood pressure. Body temperature. Skin for abnormal spots. What immunizations do I need? Vaccines are usually given at various ages, according to a schedule. Your health care provider will recommend vaccines for you based on your age, medical history, and lifestyle or other factors, such as travel or where you work. What tests do I need? Screening Your health care provider may recommend screening tests for certain conditions. This may include: Lipid and cholesterol levels. Diabetes screening. This is done by checking your blood sugar (glucose) after you have not eaten for a while (fasting). Hepatitis B test. Hepatitis C test. HIV (human immunodeficiency virus) test. STI (sexually transmitted infection) testing, if you are at risk. Talk with your health care provider about your test results, treatment options, and if necessary, the need for more tests. Follow these instructions at home: Eating and drinking  Eat a healthy diet that includes fresh fruits and vegetables, whole grains, lean protein, and low-fat dairy products. Drink enough fluid to keep your urine pale yellow. Take vitamin and mineral  supplements as recommended by your health care provider. Do not drink alcohol if your health care provider tells you not to drink. If you drink alcohol: Limit how much you have to 0-2 drinks a day. Know how much alcohol is in your drink. In the [place] one drink equals one 12 oz bottle of beer (355 mL), one 5 oz glass of wine (148 mL), or one 1 oz  glass of hard liquor (44 mL). Lifestyle Brush your teeth every morning and night with fluoride toothpaste. Floss one time each day. Exercise for at least 30 minutes 5 or more days each week. Do not use any products that contain nicotine or tobacco. These products include cigarettes, chewing tobacco, and vaping devices, such as e-cigarettes. If you need help quitting, ask your health care provider. Do not use drugs. If you are sexually active, practice safe sex. Use a condom or other form of protection to prevent STIs. Find healthy ways to manage stress, such as: Meditation, yoga, or listening to music. Journaling. Talking to a trusted person. Spending time with friends and family. Minimize exposure to UV radiation to reduce your risk of skin cancer. Safety Always wear your seat belt while driving or riding in a vehicle. Do not drive: If you have been drinking alcohol. Do not ride with someone who has been drinking. If you have been using any mind-altering substances or drugs. While texting. When you are tired or distracted. Wear a helmet and other protective equipment during sports activities. If you have firearms in your house, make sure you follow all gun safety procedures. Seek help if you have been physically or sexually abused. What's next? Go to your health care provider once a year for an annual wellness visit. Ask your health care provider how often you should have your eyes and teeth checked. Stay up to date on all vaccines. This information is not intended to replace advice given to you by your health care provider. Make sure you discuss any questions you have with your health care provider. Document Revised: 06/11/2021 Document Reviewed: 06/11/2021 Elsevier Patient Education  [place].

## 2022-09-18 NOTE — Progress Notes (Signed)
Subjective:   Rodney Walker is a 41 y.o. male who presents for Medicare Annual/Subsequent preventive examination.   Virtual Visit via Telephone Note  I connected with  Rodney Walker on 09/18/22 at  2:30 PM EDT by telephone and verified that I am speaking with the correct person using two identifiers.  Location: Patient: home  Provider: [facility]  Persons participating in the virtual visit: patient/Nurse Health Advisor   I discussed the limitations, risks, security and privacy concerns of performing an evaluation and management service by telephone and the availability of in person appointments. The patient expressed understanding and agreed to proceed.  Interactive audio and video telecommunications were attempted between this nurse and patient, however failed, due to patient having technical difficulties OR patient did not have access to video capability.  We continued and completed visit with audio only.  Some vital signs may be absent or patient reported.   Rodney Shepherd, LPN  Review of Systems     Cardiac Risk Factors include: advanced age (>54men, >65 women);hypertension;male gender     Objective:    Today's Vitals   09/18/22 1440  Weight: 130 lb (59 kg)  Height: 5\' 4"  (1.626 m)   Body mass index is 22.31 kg/m.     09/18/2022    2:44 PM 01/09/2022    4:26 AM 09/12/2021    4:00 PM 09/25/2019    3:37 PM 09/25/2019    3:00 PM 09/19/2019    8:38 AM 04/27/2018   10:17 AM  Advanced Directives  Does Patient Have a Medical Advance Directive? No No No  No No No  Would patient like information on creating a medical advance directive? No - Patient declined  No - Patient declined No - Patient declined  No - Patient declined Yes (MAU/Ambulatory/Procedural Areas - Information given)    Current Medications (verified) Outpatient Encounter Medications as of 09/18/2022  Medication Sig   albuterol (VENTOLIN HFA) 108 (90 Base) MCG/ACT inhaler Inhale 2 puffs  into the lungs every 6 (six) hours as needed for wheezing or shortness of breath.   aspirin EC 81 MG tablet Take 1 tablet (81 mg total) by mouth 2 (two) times daily.   bictegravir-emtricitabine-tenofovir AF (BIKTARVY) 50-200-25 MG TABS tablet Take 1 tablet by mouth daily.   cetirizine (ZYRTEC) 10 MG tablet TAKE 1 TABLET(10 MG) BY MOUTH DAILY   flunisolide (NASALIDE) 25 MCG/ACT (0.025%) SOLN SMARTSIG:Both Nares   hydrocortisone 1 % ointment Apply 1 application. topically 2 (two) times daily. To right wrist   metoprolol succinate (TOPROL-XL) 25 MG 24 hr tablet Take 1 tablet (25 mg total) by mouth daily. Please make overdue appt with Dr. Acie Fredrickson before anymore refills. Thank you 2nd attempt   nitroGLYCERIN (NITROSTAT) 0.4 MG SL tablet Place 1 tablet (0.4 mg total) under the tongue every 5 (five) minutes x 3 doses as needed for chest pain.   PREZCOBIX 800-150 MG tablet TAKE 1 TABLET BY MOUTH DAILY WITH BREAKFAST. SWALLOW WHOLE. DO NOT CRUSH, BREAK OR CHEW TABLETS. TAKE WITH FOOD   No facility-administered encounter medications on file as of 09/18/2022.    Allergies (verified) Patient has no known allergies.   History: Past Medical History:  Diagnosis Date   Anemia    COPD (chronic obstructive pulmonary disease) ([facility])    Coronary artery disease    MI 2010 - dissection/plaque rupture in LAD tx with antiplt/antithrombotics >> relook cath with resolution (no PCI performed) // Inf STEMI 3/19: OM1 30; pRCA 100 >> DES //  Echo 3/19:  mild LVH, EF 40-45, inf/inf-sept HK, trivial MR   Eczema    History of blood clots    History of myocardial infarction 2010; 2019   History of pleural empyema    HIV infection (HCC)    Hyperlipidemia    Hypertension    Immune deficiency disorder (HCC)    Ischemic cardiomyopathy 04/05/2018   Moderate persistent asthma without complication 10/29/2016   Myocardial infarction [facility])    2009, 2019   Pneumonia    Past Surgical History:  Procedure Laterality Date   CARDIAC  CATHETERIZATION  06/24/2009   EF 60%   CARDIAC CATHETERIZATION  06/17/2009   EF 45%. ANTERIOR HYPOKINESIS   CORONARY STENT INTERVENTION N/A 03/24/2018   Procedure: CORONARY STENT INTERVENTION;  Surgeon: Tonny Bollman, MD;  Location: [facility] INVASIVE CV LAB;  Service: Cardiovascular;  Laterality: N/A;   CORONARY/GRAFT ACUTE MI REVASCULARIZATION N/A 03/24/2018   Procedure: Coronary/Graft Acute MI Revascularization;  Surgeon: Tonny Bollman, MD;  Location: [facility] INVASIVE CV LAB;  Service: Cardiovascular;  Laterality: N/A;   LEFT HEART CATH AND CORONARY ANGIOGRAPHY N/A 03/24/2018   Procedure: LEFT HEART CATH AND CORONARY ANGIOGRAPHY;  Surgeon: Tonny Bollman, MD;  Location: [facility] INVASIVE CV LAB;  Service: Cardiovascular;  Laterality: N/A;   TOTAL HIP ARTHROPLASTY Right 09/25/2019   Procedure: RIGHT TOTAL HIP ARTHROPLASTY ANTERIOR APPROACH;  Surgeon: Tarry Kos, MD;  Location: [facility] OR;  Service: Orthopedics;  Laterality: Right;   US ECHOCARDIOGRAPHY  12/09/2009   EF 55-60%   VIDEO ASSISTED THORACOSCOPY (VATS)/ LOBECTOMY Right 04/23/2015   Procedure: VIDEO ASSISTED THORACOSCOPY (VATS)/RIGHT upper  and right middle LOBECTOMY;  Surgeon: Kerin Perna, MD;  Location[facility] OR;  Service: Thoracic;  Laterality: Right;   VIDEO BRONCHOSCOPY Bilateral 09/05/2014   Procedure: VIDEO BRONCHOSCOPY WITH FLUORO;  Surgeon: Merwyn Katos, MD;  Location: [facility] ENDOSCOPY;  Service: Cardiopulmonary;  Laterality: Bilateral;   VIDEO BRONCHOSCOPY Bilateral 04/10/2015   Procedure: VIDEO BRONCHOSCOPY WITH FLUORO;  Surgeon: Lupita Leash, MD;  Location: [facility] ENDOSCOPY;  Service: Cardiopulmonary;  Laterality: Bilateral;   Family History  Problem Relation Age of Onset   Diabetes Mother    Eczema Mother    Asthma Mother    COPD Mother    Hypertension Mother    Hyperlipidemia Mother    Stroke Mother    Heart disease Mother    Sleep apnea Mother    Liver cancer Father    Asthma Brother    Diabetes Maternal Grandmother    Heart  failure Maternal Grandmother    Social History   Socioeconomic History   Marital status: Single    Spouse name: Not on file   Number of children: 0   Years of education: Not on file   Highest education level: Not on file  Occupational History   Occupation: unemployed  Tobacco Use   Smoking status: Former    Packs/day: 0.25    Years: 17.00    Total pack years: 4.25    Types: Cigarettes, Cigars    Quit date: 03/23/2015    Years since quitting: 7.4   Smokeless tobacco: Never  Vaping Use   Vaping Use: Never used  Substance and Sexual Activity   Alcohol use: Yes    Comment: occasional   Drug use: Yes    Frequency: 7.0 times per week    Types: Marijuana    Comment: daily   Sexual activity: Not Currently    Partners: Male    Comment: accepted condoms  Other  Topics Concern   Not on file  Social History Narrative   Not on file   Social Determinants of Health   Financial Resource Strain: Low Risk  (09/18/2022)   Overall Financial Resource Strain (CARDIA)    Difficulty of Paying Living Expenses: Not hard at all  Food Insecurity: No Food Insecurity (09/18/2022)   Hunger Vital Sign    Worried About Running Out of Food in the Last Year: Never true    Ran Out of Food in the Last Year: Never true  Transportation Needs: No Transportation Needs (09/18/2022)   PRAPARE - Administrator, Civil Service (Medical): No    Lack of Transportation (Non-Medical): No  Physical Activity: Insufficiently Active (09/18/2022)   Exercise Vital Sign    Days of Exercise per Week: 3 days    Minutes of Exercise per Session: 30 min  Stress: No Stress Concern Present (09/18/2022)   [employer] of Occupational Health - Occupational Stress Questionnaire    Feeling of Stress : Not at all  Social Connections: Moderately Isolated (09/18/2022)   Social Connection and Isolation Panel [NHANES]    Frequency of Communication with Friends and Family: More than three times a week    Frequency of  Social Gatherings with Friends and Family: More than three times a week    Attends Religious Services: More than 4 times per year    Active Member of [employer] or Organizations: No    Attends Engineer, structural: Never    Marital Status: Never married    Tobacco Counseling Counseling given: Not Answered   Clinical Intake:  Pre-visit preparation completed: Yes  Pain : No/denies pain     Nutritional Risks: None Diabetes: No  How often do you need to have someone help you when you read instructions, pamphlets, or other written materials from your doctor or pharmacy?: 1 - Never  Diabetic?no   Interpreter Needed?: No  Information entered by :: Renie Ora, LPN   Activities of Daily Living    09/18/2022    2:45 PM  In your present state of health, do you have any difficulty performing the following activities:  Hearing? 0  Vision? 0  Difficulty concentrating or making decisions? 0  Walking or climbing stairs? 0  Dressing or bathing? 0  Doing errands, shopping? 0  Preparing Food and eating ? N  Using the Toilet? N  In the past six months, have you accidently leaked urine? N  Do you have problems with loss of bowel control? N  Managing your Medications? N  Managing your Finances? N  Housekeeping or managing your Housekeeping? N    Patient Care Team: Georgina Quint, MD as PCP - General (Internal Medicine) [name], [name], MD as PCP - Cardiology (Cardiology) Judyann Munson, MD as Consulting Physician (Infectious Diseases) Judyann Munson, MD as Consulting Physician (Infectious Diseases)  Indicate any recent Medical Services you may have received from other than [facility] providers in the past year (date may be approximate).     Assessment:   This is a routine wellness examination for Nicholos.  Hearing/Vision screen Vision Screening - Comments:: Due eye exam  Dietary issues and exercise activities discussed: Current Exercise Habits: Home exercise  routine, Type of exercise: walking, Time (Minutes): 30, Frequency (Times/Week): 3, Weekly Exercise (Minutes/Week): 90, Intensity: Mild, Exercise limited by: cardiac condition(s)   Goals Addressed               This Visit's Progress     Patient  Stated (pt-stated)   On track     My goal is to tone up more.       Depression Screen    09/18/2022    2:43 PM 08/24/2022   10:17 AM 05/26/2022    4:15 PM 02/19/2022    4:25 PM 11/13/2021    4:22 PM 09/12/2021    4:20 PM 10/23/2020    9:44 AM  PHQ 2/9 Scores  PHQ - 2 Score 0 0 0 0 0 0 0    Fall Risk    09/18/2022    2:41 PM 08/24/2022   10:17 AM 08/05/2022    9:34 AM 05/26/2022    4:14 PM 02/19/2022    4:25 PM  Fall Risk   Falls in the past year? 0 0 0 0 0  Number falls in past yr: 0  0 0 0  Injury with Fall? 0  0 0 0  Risk for fall due to : No Fall Risks No Fall Risks No Fall Risks No Fall Risks   Follow up Falls prevention discussed Falls evaluation completed Falls evaluation completed Falls evaluation completed     FALL RISK PREVENTION PERTAINING TO THE HOME:  Any stairs in or around the home? No  If so, are there any without handrails? No  Home free of loose throw rugs in walkways, pet beds, electrical cords, etc? Yes  Adequate lighting in your home to reduce risk of falls? Yes   ASSISTIVE DEVICES UTILIZED TO PREVENT FALLS:  Life alert? No  Use of a cane, walker or w/c? No  Grab bars in the bathroom? No  Shower chair or bench in shower? Yes  Elevated toilet seat or a handicapped toilet? Yes   Ton:        09/18/2022    2:45 PM  6CIT Screen  What Year? 0 points  What month? 0 points  What time? 0 points  Count back from 20 0 points  Months in reverse 0 points  Repeat phrase 0 points  Total Score 0 points    Immunizations Immunization History  Administered Date(s) Administered   HPV 9-valent 08/08/2018, 09/05/2018, 02/03/2019   Hepatitis A, Adult 09/26/2014, 03/14/2015   Hepatitis B, adult 09/26/2014,  01/23/2015, 03/14/2015   Influenza,inj,Quad PF,6+ Mos 09/26/2014, 10/16/2015, 10/08/2016, 10/06/2017, 11/07/2018, 10/02/2019, 09/19/2020, 11/13/2021, 08/24/2022   Meningococcal Mcv4o 08/08/2018, 11/07/2018   PFIZER Comirnaty(Gray Top)Covid-19 Tri-Sucrose Vaccine 08/27/2021   PFIZER(Purple Top)SARS-COV-2 Vaccination 04/03/2020, 04/24/2020, 11/08/2020   PNEUMOCOCCAL CONJUGATE-20 02/19/2022   Pneumococcal Conjugate-13 05/05/2018   Pneumococcal Polysaccharide-23 09/26/2014   Tdap 10/28/2016    TDAP status: Up to date  Flu Vaccine status: Up to date  Pneumococcal vaccine status: Up to date  Covid-19 vaccine status: Completed vaccines  Qualifies for Shingles Vaccine? Yes   Zostavax completed No   Shingrix Completed?: Yes  Screening Tests Health Maintenance  Topic Date Due   COVID-19 Vaccine (5 - Pfizer risk series) 10/22/2021   TETANUS/TDAP  10/28/2026   INFLUENZA VACCINE  Completed   HPV VACCINES  Completed   Hepatitis C Screening  Completed   HIV Screening  Completed    Health Maintenance  Health Maintenance Due  Topic Date Due   COVID-19 Vaccine (5 - Pfizer risk series) 10/22/2021    Colorectal cancer screening: Referral to GI placed Patient not of age . Pt aware the office will call re: appt.  Lung Cancer Screening: (Low Dose CT Chest recommended if Age 18-80 years, 30 pack-year currently smoking OR have quit w/in 15years.) does  qualify.   Lung Cancer Screening Referral: declines   Additional Screening:  Hepatitis C Screening: does not qualify;   Vision Screening: Recommended annual ophthalmology exams for early detection of glaucoma and other disorders of the eye. Is the patient up to date with their annual eye exam?  No  Who is the provider or what is the name of the office in which the patient attends annual eye exams? None  If pt is not established with a provider, would they like to be referred to a provider to establish care? Yes .   Dental Screening:  Recommended annual dental exams for proper oral hygiene  Community Resource Referral / Chronic Care Management: CRR required this visit?  No   CCM required this visit?  No      Plan:     I have personally reviewed and noted the following in the patient's chart:   Medical and social history Use of alcohol, tobacco or illicit drugs  Current medications and supplements including opioid prescriptions. Patient is not currently taking opioid prescriptions. Functional ability and status Nutritional status Physical activity Advanced directives List of other physicians Hospitalizations, surgeries, and ER visits in previous 12 months Vitals Screenings to include cognitive, depression, and falls Referrals and appointments  In addition, I have reviewed and discussed with patient certain preventive protocols, quality metrics, and best practice recommendations. A written personalized care plan for preventive services as well as general preventive health recommendations were provided to patient.     Lorrene Reid, LPN   04/29/5464   Nurse Notes: due eye exam referral 09/18/2022

## 2022-09-21 ENCOUNTER — Other Ambulatory Visit: Payer: Medicare Other

## 2022-09-21 ENCOUNTER — Other Ambulatory Visit: Payer: Self-pay | Admitting: *Deleted

## 2022-09-21 DIAGNOSIS — Z01818 Encounter for other preprocedural examination: Secondary | ICD-10-CM | POA: Diagnosis not present

## 2022-09-23 LAB — NOVEL CORONAVIRUS, NAA: SARS-CoV-2, NAA: NOT DETECTED

## 2022-09-23 LAB — SPECIMEN STATUS REPORT

## 2022-09-23 NOTE — Telephone Encounter (Signed)
Patient called to cancel his [facility] for tomorrow. Patient states he's starting a new job tomorrow and will reschedule the surgery soon.

## 2022-09-23 NOTE — Progress Notes (Signed)
Patient called to cancel bronchoscopy tomorrow with Dr Lake Bells. Asked patient to also let the office know. Dr. Lake Bells made aware as well.

## 2022-09-24 ENCOUNTER — Encounter: Payer: Self-pay | Admitting: Pulmonary Disease

## 2022-09-24 ENCOUNTER — Ambulatory Visit (HOSPITAL_COMMUNITY): Admission: RE | Admit: 2022-09-24 | Payer: Medicare Other | Source: Ambulatory Visit | Admitting: Pulmonary Disease

## 2022-09-24 ENCOUNTER — Encounter (HOSPITAL_COMMUNITY): Admission: RE | Payer: Self-pay | Source: Ambulatory Visit

## 2022-09-24 SURGERY — VIDEO BRONCHOSCOPY WITHOUT FLUORO
Anesthesia: General

## 2022-09-24 SURGERY — VIDEO BRONCHOSCOPY WITHOUT FLUORO
Anesthesia: Monitor Anesthesia Care

## 2022-10-06 ENCOUNTER — Encounter (HOSPITAL_COMMUNITY): Payer: Self-pay | Admitting: Pulmonary Disease

## 2022-10-06 ENCOUNTER — Other Ambulatory Visit: Payer: Self-pay

## 2022-10-06 ENCOUNTER — Other Ambulatory Visit: Payer: Self-pay | Admitting: Pulmonary Disease

## 2022-10-06 DIAGNOSIS — J189 Pneumonia, unspecified organism: Secondary | ICD-10-CM

## 2022-10-06 NOTE — Progress Notes (Signed)
Spoke with pt for pre-op call. Pt has an extensive cardiac history since 2010 with stents. Dr. Acie Fredrickson is his cardiologist, last office visit was 08/19/22. Pt has been on Metoprolol, but has not had for months. He says Dr. Acie Fredrickson never refilled it, but he doesn't remember Dr. Acie Fredrickson telling him he didn't need to take it anymore. Pt states he is not diabetic.   Shower instructions given to pt and he voiced understanding.

## 2022-10-07 NOTE — Anesthesia Preprocedure Evaluation (Addendum)
Anesthesia Evaluation  Patient identified by MRN, date of birth, ID band Patient awake    Reviewed: Allergy & Precautions, NPO status , Patient's Chart, lab work & pertinent test results, reviewed documented beta blocker date and time   Airway Mallampati: I       Dental no notable dental hx.    Pulmonary asthma , COPD,  COPD inhaler, Patient abstained from smoking., former smoker,    Pulmonary exam normal        Cardiovascular hypertension, Pt. on home beta blockers + CAD, + Past MI and + Cardiac Stents  Normal cardiovascular exam     Neuro/Psych negative psych ROS   GI/Hepatic   Endo/Other    Renal/GU   negative genitourinary   Musculoskeletal   Abdominal Normal abdominal exam  (+)   Peds  Hematology   Anesthesia Other Findings   Reproductive/Obstetrics                            Anesthesia Physical Anesthesia Plan  ASA: 3  Anesthesia Plan: General   Post-op Pain Management: Minimal or no pain anticipated   Induction: Intravenous  PONV Risk Score and Plan: 2 and Ondansetron, Dexamethasone and Midazolam  Airway Management Planned: Oral ETT  Additional Equipment: None  Intra-op Plan:   Post-operative Plan: Extubation in OR  Informed Consent: I have reviewed the patients History and Physical, chart, labs and discussed the procedure including the risks, benefits and alternatives for the proposed anesthesia with the patient or authorized representative who has indicated his/her understanding and acceptance.     Dental advisory given  Plan Discussed with: CRNA  Anesthesia Plan Comments: (PAT note written 10/07/2022 by Myra Gianotti, PA-C. )       Anesthesia Quick Evaluation

## 2022-10-07 NOTE — Progress Notes (Signed)
Anesthesia Chart Review: Rodney Walker  Case: [id number] Date/Time: 10/08/22 1300   Procedure: VIDEO BRONCHOSCOPY WITHOUT FLUORO   Anesthesia type: General   Pre-op diagnosis: lung mass   Location: [facility] / [facility]   Surgeons: Juanito Doom, MD       DISCUSSION: Patient is a 41 year old male with HIV who is scheduled for the above procedure. 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 Chest CTA on 07/28/22 showed no PE, slightly more conspicuous 4 mm LUL lung nodule, moderate-severe bullous emphysema, previous RU lobectomy, mild bronchiectasis RML with nodular thickening which may be postsurgical versus recurrent disease.  He was evaluated by Dr. Lake Bells on 09/01/22 who wrote, "The differential diagnosis include sarcoidosis, however this would be an atypical presentation and the much more likely would be a nontuberculous Mycobacterium versus fungal related to his ongoing marijuana use." Bronchoscopy recommended and was initially scheduled last months, but patient rescheduled.    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 Daily marijuana use.   Last PCP visit with Dr. Mitchel Honour was on 08/05/22. Pulmonology referral recommended given 07/28/22 CTA results.   Last cardiology evaluation was on 08/19/22 by Dr. Acie Fredrickson. Overall felt to be doing well from a CAD standpoint. Has been managed on ASA 81 mg daily and prasugrel (Effient 10 mg daily) due to history of 2 separate MIs involving  different vessels. He noted severe emphysema on recent CTA in setting of regular marijuana smoking. Pulmonology follow-up recommended. One year cardiology follow-up recommended.  Last ID follow-up with Dr. Baxter Flattery was on 08/24/22 for HIV follow-up. HIV1 RNA Quant < 20 (H) and CD4 Tcell 22 (L) on 05/26/22. On Biktarvy, Prezcobix.   I called and spoke with Rodney Walker to inquire about ASA and Effient. He reported that last doses were on 10/07/22. He was not instructed to hold for procedure. I reached out to Dr. Lake Bells who indicated he was only doing bronchoalveolar lavage (BAL), so patient would not need to hold these for the procedure. Patient notified. Rodney Walker also indicated that due to his new work schedule, he was unable to get presurgical COVID-19 testing on 10/06/22, so he is currently scheduled for same day testing. (He does have his typical intermittent allergic rhinitis symptoms, but denied sick symptoms or fever.) I sent communication to Dr. Lake Bells same day COVID testing. Rodney Walker reports no new CV symptoms since his recent visit with Dr. Joesph Fillers said Dr. Acie Fredrickson did not refill his Toprol, so he was not sure if he was suppose to continue or not--I don't see that it was discontinued, so I encouraged him to get in touch with his office to clarify.  He is a same day work-up, so further evaluation on the day of surgery as indicated.   VS:  BP Readings from Last 3 Encounters:  09/01/22 (!) 112/58  08/24/22 105/69  08/19/22 102/64   Pulse Readings from Last 3 Encounters:  09/01/22 96  08/24/22 73  08/19/22 76     PROVIDERS: Horald Pollen, MD is PCP  Mertie Moores, MD is cardiologist Simonne Maffucci, MD is pulmonologist Baxter Flattery,  Caren Griffins, MD is ID   LABS: For day of surgery as indicated. Most recent results in [facility] include: Lab Results  Component Value Date   WBC 11.7 (H) 07/28/2022   HGB 15.4 07/28/2022   HCT 41.1 07/28/2022   PLT 219 07/28/2022   GLUCOSE 96 07/28/2022    ALT 19 05/26/2022   AST 22 05/26/2022   NA 140 07/28/2022   K 3.9 07/28/2022   CL 106 07/28/2022   CREATININE 1.11 07/28/2022   BUN 13 07/28/2022   CO2 27 07/28/2022    IMAGES: CTA Chest 07/28/22: IMPRESSION: 1. No CT evidence of pulmonary artery embolus. 2. Postsurgical changes of the right upper lobe resection with scarring and architectural distortion. Slight nodular thickening of the right middle lobe interstitial markings may be postsurgical. Recurrent disease is not excluded. PET-CT may provide better evaluation. 3. A 4 mm nodule in the left apex, which was present on the prior CT but appears slightly more conspicuous on today's exam. 4. Aortic Atherosclerosis (ICD10-I70.0) and Emphysema (ICD10-J43.9).   1V CXR 07/28/22: FINDINGS: Similar chronic volume loss, surgical change, and hazy density in the right mid and lower lung. Hyperinflation with flattening of the left hemidiaphragm. No new focal consolidation, pleural effusion, or pneumothorax. Normal cardiomediastinal silhouette. No acute osseous abnormality. Remote right rib fractures. IMPRESSION: Emphysema and postoperative changes right chest. No significant change from prior.    EKG: 07/29/22: Sinus rhythm LAE, consider biatrial enlargement Anterior infarct, old No significant change since last tracing Confirmed by Calvert Cantor [phone]) on 07/29/2022 1:47:19 AM   CV: Echo 03/25/18 (in setting of STEMI): Study Conclusions  - Procedure narrative: Transthoracic echocardiography. Image    quality was poor. The study was technically difficult, as a    result of poor acoustic windows. Intravenous contrast (Definity)    was administered.  - Left ventricle: The cavity size was normal. There was mild    concentric hypertrophy. Systolic function was mildly to    moderately reduced. The estimated ejection fraction was in the    range of 40% to 45%. Inferior and inferoseptal hypokinesis. The    study is not technically  sufficient to allow evaluation of LV    diastolic function.  - Mitral valve: Mildly thickened leaflets . There was trivial    regurgitation.  - Left atrium: The atrium was normal in size.  - Inferior vena cava: The vessel was dilated. The respirophasic    diameter changes were blunted (< 50%), consistent with elevated    central venous pressure.  - Impressions: Compared to a prior study in 2016, the LVEF is lower    at 40-45% with inferior and inferoseptal hypokinesis.   Impressions:  - Compared to a prior study in 2016, the LVEF is lower at 40-45%    with inferior and inferoseptal hypokinesis.    Cardiac cath 03/24/18 (in setting of STEMI): Prox RCA to Mid RCA lesion is 100% stenosed. Ost 1st Mrg lesion is 30% stenosed. The left ventricular systolic function is normal. LV end diastolic pressure is normal. The left ventricular ejection fraction is greater than 65% by visual estimate. A drug-eluting stent was successfully placed using a STENT SIERRA 4.00 X 23 MM. Post intervention, there is a 0% residual stenosis.   1.  Severe single-vessel coronary artery disease with acute thrombotic total occlusion of the RCA, treated successfully with aspiration thrombectomy and drug-eluting stent implantation (4.0 x 23 mm Sierra DES) 2.  Patent left main, left circumflex, and LAD with minimal irregularity 3.  Preserved overall LV systolic function with LVEF 65% and normal LVEDP   Recommendations: Dual antiplatelet therapy with aspirin and Brilinta at least 12 months.  The patient is now had 2 acute MIs in different vessels, both likely secondary to plaque rupture and possibly hypercoagulable condition.  Might consider long-term dual antiplatelet therapy if tolerated.  If no complications arise early discharge could be considered in 24-48 hours.    Past Medical History:  Diagnosis Date   Anemia    COPD (chronic obstructive pulmonary disease) ([place])    Coronary artery disease    MI 2010 -  dissection/plaque rupture in LAD tx with antiplt/antithrombotics >> relook cath with resolution (no PCI performed) // Inf STEMI 3/19: OM1 30; pRCA 100 >> DES // Echo 3/19:  mild LVH, EF 40-45, inf/inf-sept HK, trivial MR   Eczema    GERD (gastroesophageal reflux disease)    History of blood clots    History of myocardial infarction 2010; 2019   History of pleural empyema    HIV infection ([place])    Hyperlipidemia    Hypertension    Immune deficiency disorder ([place])    Ischemic cardiomyopathy 04/05/2018   Moderate persistent asthma without complication 56/43/3295   Myocardial infarction ([place])    2009, 2019   Pneumonia     Past Surgical History:  Procedure Laterality Date   CARDIAC CATHETERIZATION  06/24/2009   EF 60%   CARDIAC CATHETERIZATION  06/17/2009   EF 45%. ANTERIOR HYPOKINESIS   CORONARY STENT INTERVENTION N/A 03/24/2018   Procedure: CORONARY STENT INTERVENTION;  Surgeon: Sherren Mocha, MD;  Location: [facility] [facility];  Service: Cardiovascular;  Laterality: N/A;   CORONARY/GRAFT ACUTE MI REVASCULARIZATION N/A 03/24/2018   Procedure: Coronary/Graft Acute MI Revascularization;  Surgeon: Sherren Mocha, MD;  Location: [facility];  Service: Cardiovascular;  Laterality: N/A;   LEFT HEART CATH AND CORONARY ANGIOGRAPHY N/A 03/24/2018   Procedure: LEFT HEART CATH AND CORONARY ANGIOGRAPHY;  Surgeon: Sherren Mocha, MD;  Location: [facility];  Service: Cardiovascular;  Laterality: N/A;   TOTAL HIP ARTHROPLASTY Right 09/25/2019   Procedure: RIGHT TOTAL HIP ARTHROPLASTY ANTERIOR APPROACH;  Surgeon: Leandrew Koyanagi, MD;  Location: [facility];  Service: Orthopedics;  Laterality: Right;   US ECHOCARDIOGRAPHY  12/09/2009   EF 55-60%   VIDEO ASSISTED THORACOSCOPY (VATS)/ LOBECTOMY Right 04/23/2015   Procedure: VIDEO ASSISTED THORACOSCOPY (VATS)/RIGHT upper  and right middle LOBECTOMY;  Surgeon: Ivin Poot, MD;  Location: [facility];  Service: Thoracic;  Laterality: Right;   VIDEO  BRONCHOSCOPY Bilateral 09/05/2014   Procedure: VIDEO BRONCHOSCOPY WITH FLUORO;  Surgeon: Wilhelmina Mcardle, MD;  Location[facility] ENDOSCOPY;  Service: Cardiopulmonary;  Laterality: Bilateral;   VIDEO BRONCHOSCOPY Bilateral 04/10/2015   Procedure: VIDEO BRONCHOSCOPY WITH FLUORO;  Surgeon: Juanito Doom, MD;  Location: [facility] ENDOSCOPY;  Service: Cardiopulmonary;  Laterality: Bilateral;    MEDICATIONS: No current facility-administered medications for this encounter.    albuterol (VENTOLIN HFA) 108 (90 Base) MCG/ACT inhaler   aspirin EC 81 MG tablet   bictegravir-emtricitabine-tenofovir AF (BIKTARVY) 50-200-25 MG TABS tablet   cetirizine (ZYRTEC) 10 MG tablet   EPINEPHrine 0.3 mg/0.3 mL IJ SOAJ injection   flunisolide (NASALIDE) 25 MCG/ACT (0.025%) SOLN   hydrocortisone 1 % ointment   metoprolol succinate (TOPROL-XL) 25 MG 24 hr tablet   nitroGLYCERIN (NITROSTAT) 0.4 MG SL tablet   prasugrel (EFFIENT) 10 MG TABS tablet   PREZCOBIX 800-150 MG tablet   rosuvastatin (CRESTOR) 10 MG tablet  Myra Gianotti, PA-C Surgical Short Stay/Anesthesiology [facility] Phone [phone] [facility] Phone [phone] 10/07/2022 12:39 PM

## 2022-10-08 ENCOUNTER — Other Ambulatory Visit: Payer: Self-pay

## 2022-10-08 ENCOUNTER — Encounter (HOSPITAL_COMMUNITY): Payer: Self-pay | Admitting: Pulmonary Disease

## 2022-10-08 ENCOUNTER — Ambulatory Visit (HOSPITAL_BASED_OUTPATIENT_CLINIC_OR_DEPARTMENT_OTHER): Payer: Medicare Other | Admitting: Vascular Surgery

## 2022-10-08 ENCOUNTER — Ambulatory Visit (HOSPITAL_COMMUNITY): Payer: Medicare Other | Admitting: Vascular Surgery

## 2022-10-08 ENCOUNTER — Encounter (HOSPITAL_COMMUNITY)
Admission: RE | Disposition: A | Discharge status: Home / Self Care | Payer: Self-pay | Source: Ambulatory Visit | Attending: Pulmonary Disease

## 2022-10-08 ENCOUNTER — Ambulatory Visit (HOSPITAL_COMMUNITY)
Admission: RE | Admit: 2022-10-08 | Discharge: 2022-10-08 | Disposition: A | Discharge status: Home / Self Care | Payer: Medicare Other | Source: Ambulatory Visit | Attending: Pulmonary Disease | Admitting: Pulmonary Disease

## 2022-10-08 DIAGNOSIS — Z9889 Other specified postprocedural states: Secondary | ICD-10-CM

## 2022-10-08 DIAGNOSIS — Z79899 Other long term (current) drug therapy: Secondary | ICD-10-CM | POA: Diagnosis not present

## 2022-10-08 DIAGNOSIS — J439 Emphysema, unspecified: Secondary | ICD-10-CM | POA: Insufficient documentation

## 2022-10-08 DIAGNOSIS — Z21 Asymptomatic human immunodeficiency virus [HIV] infection status: Secondary | ICD-10-CM | POA: Insufficient documentation

## 2022-10-08 DIAGNOSIS — I2510 Atherosclerotic heart disease of native coronary artery without angina pectoris: Secondary | ICD-10-CM

## 2022-10-08 DIAGNOSIS — I255 Ischemic cardiomyopathy: Secondary | ICD-10-CM | POA: Insufficient documentation

## 2022-10-08 DIAGNOSIS — E785 Hyperlipidemia, unspecified: Secondary | ICD-10-CM | POA: Diagnosis not present

## 2022-10-08 DIAGNOSIS — Z1152 Encounter for screening for COVID-19: Secondary | ICD-10-CM | POA: Insufficient documentation

## 2022-10-08 DIAGNOSIS — J432 Centrilobular emphysema: Secondary | ICD-10-CM | POA: Diagnosis not present

## 2022-10-08 DIAGNOSIS — R918 Other nonspecific abnormal finding of lung field: Secondary | ICD-10-CM | POA: Diagnosis not present

## 2022-10-08 DIAGNOSIS — Z87891 Personal history of nicotine dependence: Secondary | ICD-10-CM | POA: Diagnosis not present

## 2022-10-08 DIAGNOSIS — Z902 Acquired absence of lung [part of]: Secondary | ICD-10-CM | POA: Diagnosis not present

## 2022-10-08 DIAGNOSIS — R911 Solitary pulmonary nodule: Secondary | ICD-10-CM

## 2022-10-08 DIAGNOSIS — J984 Other disorders of lung: Secondary | ICD-10-CM | POA: Diagnosis not present

## 2022-10-08 DIAGNOSIS — Z955 Presence of coronary angioplasty implant and graft: Secondary | ICD-10-CM | POA: Insufficient documentation

## 2022-10-08 DIAGNOSIS — I252 Old myocardial infarction: Secondary | ICD-10-CM | POA: Diagnosis not present

## 2022-10-08 DIAGNOSIS — F1210 Cannabis abuse, uncomplicated: Secondary | ICD-10-CM

## 2022-10-08 DIAGNOSIS — I10 Essential (primary) hypertension: Secondary | ICD-10-CM | POA: Diagnosis not present

## 2022-10-08 HISTORY — PX: SINUS IRRIGATION: SHX2411

## 2022-10-08 HISTORY — DX: Gastro-esophageal reflux disease without esophagitis: K21.9

## 2022-10-08 HISTORY — PX: VIDEO BRONCHOSCOPY: SHX5072

## 2022-10-08 LAB — BASIC METABOLIC PANEL
Anion gap: 9 (ref 5–15)
BUN: 11 mg/dL (ref 6–20)
CO2: 24 mmol/L (ref 22–32)
Calcium: 9.1 mg/dL (ref 8.9–10.3)
Chloride: 105 mmol/L (ref 98–111)
Creatinine, Ser: 1.12 mg/dL (ref 0.61–1.24)
GFR, Estimated: >60 mL/min (ref >60)
Glucose, Bld: 96 mg/dL (ref 70–99)
Potassium: 3.4 mmol/L — ABNORMAL LOW (ref 3.5–5.1)
Sodium: 138 mmol/L (ref 135–145)

## 2022-10-08 LAB — SARS CORONAVIRUS 2 BY RT PCR: SARS Coronavirus 2 by RT PCR: NEGATIVE

## 2022-10-08 LAB — CBC
Hemoglobin: 14.8 g/dL (ref 13.0–17.0)
Platelets: 217 10*3/uL (ref 150–400)
WBC: 7.8 10*3/uL (ref 4.0–10.5)

## 2022-10-08 LAB — BODY FLUID CELL COUNT WITH DIFFERENTIAL
Eos, Fluid: 2 %
Lymphs, Fluid: 20 %
Monocyte-Macrophage-Serous Fluid: 28 % — ABNORMAL LOW (ref 50–90)
Neutrophil Count, Fluid: 50 % — ABNORMAL HIGH (ref 0–25)
Total Nucleated Cell Count, Fluid: 525 cu mm (ref 0–1000)

## 2022-10-08 SURGERY — VIDEO BRONCHOSCOPY WITHOUT FLUORO
Anesthesia: General

## 2022-10-08 MED ORDER — CHLORHEXIDINE GLUCONATE 0.12 % MT SOLN
OROMUCOSAL | Status: AC
  Filled 2022-10-08: qty 15

## 2022-10-08 MED ORDER — FENTANYL CITRATE (PF) 100 MCG/2ML IJ SOLN
INTRAMUSCULAR | Status: AC
  Filled 2022-10-08: qty 2

## 2022-10-08 MED ORDER — LACTATED RINGERS IV SOLN: INTRAVENOUS | Status: DC

## 2022-10-08 MED ORDER — PROPOFOL 10 MG/ML IV BOLUS
INTRAVENOUS | Status: DC | PRN
  Administered 2022-10-08: 20 mg via INTRAVENOUS
  Administered 2022-10-08: 160 mg via INTRAVENOUS

## 2022-10-08 MED ORDER — FENTANYL CITRATE (PF) 100 MCG/2ML IJ SOLN
INTRAMUSCULAR | Status: DC | PRN
  Administered 2022-10-08: 100 ug via INTRAVENOUS

## 2022-10-08 MED ORDER — ROCURONIUM BROMIDE 100 MG/10ML IV SOLN
INTRAVENOUS | Status: DC | PRN
  Administered 2022-10-08: 4 mg via INTRAVENOUS

## 2022-10-08 MED ORDER — DEXAMETHASONE SODIUM PHOSPHATE 4 MG/ML IJ SOLN
INTRAMUSCULAR | Status: DC | PRN
  Administered 2022-10-08: 4 mg via INTRAVENOUS

## 2022-10-08 MED ORDER — ONDANSETRON HCL 4 MG/2ML IJ SOLN
INTRAMUSCULAR | Status: DC | PRN
  Administered 2022-10-08: 4 mg via INTRAVENOUS

## 2022-10-08 MED ORDER — LIDOCAINE 2% (20 MG/ML) 5 ML SYRINGE
INTRAMUSCULAR | Status: DC | PRN
  Administered 2022-10-08: 20 mg via INTRAVENOUS

## 2022-10-08 MED ORDER — CHLORHEXIDINE GLUCONATE 0.12 % MT SOLN
15.0000 mL | Freq: Once | OROMUCOSAL | Status: AC
  Administered 2022-10-08: 15 mL via OROMUCOSAL

## 2022-10-08 MED ORDER — SUCCINYLCHOLINE CHLORIDE 200 MG/10ML IV SOSY
PREFILLED_SYRINGE | INTRAVENOUS | Status: DC | PRN
  Administered 2022-10-08: 100 mg via INTRAVENOUS

## 2022-10-08 NOTE — Op Note (Signed)
[facility] Cardiopulmonary Patient Name: Rodney Walker Date: 10/08/2022 MRN: [id number] Attending MD: Juanito Doom , MD Date of Birth: [birth year] CSN: Finalized Age: 41 Admit Type: Outpatient Gender: Male Procedure:             Bronchoscopy Indications:           Right middle lobe mass Providers:             Nathaneil Canary B. Lake Bells, MD, Allayne Gitelman, RN, Gloris Ham, Technician Referring MD:           Medicines:             General Anesthesia Complications:         No immediate complications Estimated Blood Loss:  Estimated blood loss: none. Procedure:             Pre-Anesthesia Assessment:                        - A History and Physical has been performed. Patient                         meds and allergies have been reviewed. The risks and                         benefits of the procedure and the sedation options and                         risks were discussed with the patient. All questions                         were answered and informed consent was obtained.                         Patient identification and proposed procedure were                         verified prior to the procedure by the physician and                         the nurse in the pre-procedure area. Mental Status                         Examination: normal. Airway Examination: normal                         oropharyngeal airway. Respiratory Examination: clear                         to auscultation. CV Examination: normal. ASA Grade                         Assessment: II - A patient with mild systemic disease.                         After reviewing the risks and benefits, the patient  was deemed in satisfactory condition to undergo the                         procedure. The anesthesia plan was to use moderate                         sedation / analgesia (conscious sedation). Immediately                         prior to  administration of medications, the patient                         was re-assessed for adequacy to receive sedatives. The                         heart rate, respiratory rate, oxygen saturations,                         blood pressure, adequacy of pulmonary ventilation, and                         response to care were monitored throughout the                         procedure. The physical status of the patient was                         re-assessed after the procedure.                        After obtaining informed consent, the bronchoscope was                         passed under direct vision. Throughout the procedure,                         the patient's blood pressure, pulse, and oxygen                         saturations were monitored continuously. the [id number]                         ([id number]) Olympus bronchoscope was introduced through                         the mouth, via the endotracheal tube and advanced to                         the tracheobronchial tree. The procedure was                         accomplished without difficulty. The patient tolerated                         the procedure well. The total duration of the                         procedure was 4 minutes. Scope In: Scope Out: Findings:  The nasopharynx/oropharynx appears normal. The larynx appears normal.       The vocal cords appear normal. The subglottic space is normal. The       trachea is of normal caliber. The carina is sharp. The tracheobronchial       tree of the left lung was examined to at least the first subsegmental       level. Bronchial mucosa and anatomy in the left lung are normal; there       are no endobronchial lesions, and no secretions.      Right Lung Abnormalities: Evidence of previous surgery was found in the       right upper lobe. The bronchial stump is well healed. Scant, grey, thin       secretions were found throughout the right tracheobronchial tree. They       were not  obstructing the airway. BAL was performed in the right middle       lobe of the lung and sent for cell count, bacterial culture, viral       smears & culture, and fungal & AFB analysis and cytology, Aspergillus       antigen and Pneumocystis (PCP/PJP) analysis. 120 mL of fluid were       instilled. 30 mL were returned. The return was blood-tinged and cloudy.       Mucous plugs were present in the return fluid. Impression:            - Right middle lobe mass                        - The airway examination of the left lung was normal.                        - Evidence of previous surgery was found in the right                         upper lobe.                        - Scant, grey, thin secretions were found throughout                         the right tracheobronchial tree.                        - Bronchoalveolar lavage was performed. Moderate Sedation:      General Anesthesia Recommendation:        - Await BAL, culture and cytology results. Procedure Code(s):     --- Professional ---                        [phone], Bronchoscopy, rigid or flexible, including                         fluoroscopic guidance, when performed; with bronchial                         alveolar lavage Diagnosis Code(s):     --- Professional ---                        R91.8, Other nonspecific abnormal finding of lung field  L89.211, Other specified postprocedural states                        R09.89, Other specified symptoms and signs involving                         the circulatory and respiratory systems CPT copyright 2019 American Medical Association. All rights reserved. The codes documented in this report are preliminary and upon coder review may  be revised to meet current compliance requirements. Norlene Campbell, MD Juanito Doom, MD 10/08/2022 1:28:05 PM This report has been signed electronically. Number of Addenda: 0

## 2022-10-08 NOTE — Anesthesia Procedure Notes (Signed)
Procedure Name: Intubation Date/Time: 10/08/2022 1:15 PM  Performed by: Reeves Dam, CRNAPre-anesthesia Checklist: Patient identified, Patient being monitored, Timeout performed, Emergency Drugs available and Suction available Patient Re-evaluated:Patient Re-evaluated prior to induction Oxygen Delivery Method: Circle system utilized Preoxygenation: Pre-oxygenation with 100% oxygen Induction Type: IV induction Ventilation: Mask ventilation without difficulty Laryngoscope Size: 3 and Miller Grade View: Grade II Tube type: Oral Tube size: 8.5 mm Number of attempts: 1 Airway Equipment and Method: Stylet Placement Confirmation: ETT inserted through vocal cords under direct vision, positive ETCO2 and breath sounds checked- equal and bilateral Secured at: 22 cm Tube secured with: Tape Dental Injury: Teeth and Oropharynx as per pre-operative assessment

## 2022-10-08 NOTE — Anesthesia Postprocedure Evaluation (Signed)
Anesthesia Post Note  Patient: Careers information officer  Procedure(s) Performed: VIDEO BRONCHOSCOPY WITHOUT FLUORO     Patient location during evaluation: PACU Anesthesia Type: General Level of consciousness: awake and sedated Pain management: pain level controlled Vital Signs Assessment: post-procedure vital signs reviewed and stable Respiratory status: spontaneous breathing Cardiovascular status: stable Postop Assessment: no apparent nausea or vomiting Anesthetic complications: no   No notable events documented.  Last Vitals:  Vitals:   10/08/22 1400 10/08/22 1410  BP: 112/72 109/72  Pulse: 91 81  Resp: 17 18  Temp:    SpO2: 93% 96%    Last Pain:  Vitals:   10/08/22 1410  TempSrc:   PainSc: 0-No pain                 Huston Foley

## 2022-10-08 NOTE — Transfer of Care (Signed)
Immediate Anesthesia Transfer of Care Note  Patient: Rodney Walker  Procedure(s) Performed: VIDEO BRONCHOSCOPY WITHOUT FLUORO  Patient Location: PACU  Anesthesia Type:General  Level of Consciousness: awake, alert , oriented and patient cooperative  Airway & Oxygen Therapy: Patient Spontanous Breathing and Patient connected to face mask oxygen  Post-op Assessment: Report given to RN and Post -op Vital signs reviewed and stable  Post vital signs: Reviewed and stable  Last Vitals:  Vitals Value Taken Time  BP 140/115 10/08/22 1341  Temp 36.6 C 10/08/22 1340  Pulse 99 10/08/22 1347  Resp 20 10/08/22 1347  SpO2 97 % 10/08/22 1347  Vitals shown include unvalidated device data.  Last Pain:  Vitals:   10/08/22 1341  TempSrc:   PainSc: 0-No pain         Complications: No notable events documented.

## 2022-10-08 NOTE — H&P (Signed)
[facility]  CC: dyspnea HPI: 41 y/o male with HIV, smokes marijuana with RUL cavitary pneumonia.  Had a nearly identical presentation years ago, treated for MAC and improved. Here for bronchoscopy, feels OK.  Past Medical History:  Diagnosis Date   Anemia    COPD (chronic obstructive pulmonary disease) ([place])    Coronary artery disease    MI 2010 - dissection/plaque rupture in LAD tx with antiplt/antithrombotics >> relook cath with resolution (no PCI performed) // Inf STEMI 3/19: OM1 30; pRCA 100 >> DES // Echo 3/19:  mild LVH, EF 40-45, inf/inf-sept HK, trivial MR   Eczema    GERD (gastroesophageal reflux disease)    History of blood clots    History of myocardial infarction 2010; 2019   History of pleural empyema    HIV infection ([place])    Hyperlipidemia    Hypertension    Immune deficiency disorder ([place])    Ischemic cardiomyopathy 04/05/2018   Moderate persistent asthma without complication 16/06/3709   Myocardial infarction [facility])    2009, 2019   Pneumonia      Family History  Problem Relation Age of Onset   Diabetes Mother    Eczema Mother    Asthma Mother    COPD Mother    Hypertension Mother    Hyperlipidemia Mother    Stroke Mother    Heart disease Mother    Sleep apnea Mother    Liver cancer Father    Asthma Brother    Diabetes Maternal Grandmother    Heart failure Maternal Grandmother      Social History   Socioeconomic History   Marital status: Single    Spouse name: Not on file   Number of children: 0   Years of education: Not on file   Highest education level: Not on file  Occupational History   Occupation: unemployed  Tobacco Use   Smoking status: Former    Packs/day: 0.25    Years: 17.00    Total pack years: 4.25    Types: Cigarettes, Cigars    Quit date: 03/23/2015    Years since quitting: 7.5   Smokeless tobacco: Never  Vaping Use   Vaping Use: Never used  Substance and Sexual Activity   Alcohol use: Yes    Comment: occasional   Drug use:  Yes    Frequency: 7.0 times per week    Types: Marijuana    Comment: daily   Sexual activity: Not Currently    Partners: Male    Comment: accepted condoms  Other Topics Concern   Not on file  Social History Narrative   Not on file   Social Determinants of Health   Financial Resource Strain: Low Risk  (09/18/2022)   Overall Financial Resource Strain (CARDIA)    Difficulty of Paying Living Expenses: Not hard at all  Food Insecurity: No Food Insecurity (09/18/2022)   Hunger Vital Sign    Worried About Running Out of Food in the Last Year: Never true    Ran Out of Food in the Last Year: Never true  Transportation Needs: No Transportation Needs (09/18/2022)   PRAPARE - Hydrologist (Medical): No    Lack of Transportation (Non-Medical): No  Physical Activity: Insufficiently Active (09/18/2022)   Exercise Vital Sign    Days of Exercise per Week: 3 days    Minutes of Exercise per Session: 30 min  Stress: No Stress Concern Present (09/18/2022)   [place]  Stress Questionnaire    Feeling of Stress : Not at all  Social Connections: Moderately Isolated (09/18/2022)   Social Connection and Isolation Panel [NHANES]    Frequency of Communication with Friends and Family: More than three times a week    Frequency of Social Gatherings with Friends and Family: More than three times a week    Attends Religious Services: More than 4 times per year    Active Member of Clubs or Organizations: No    Attends Archivist Meetings: Never    Marital Status: Never married  Intimate Partner Violence: Not At Risk (09/18/2022)   Humiliation, Afraid, Rape, and Kick questionnaire    Fear of Current or Ex-Partner: No    Emotionally Abused: No    Physically Abused: No    Sexually Abused: No     No Known Allergies   _0 @ Vitals:   10/08/22 1035  BP: 119/76  Pulse: 79  Resp: 18  Temp: 97.6 F (36.4 C)  TempSrc:  Oral  SpO2: 95%  Weight: 59 kg  Height: _1  (1.626 m)   Gen: well appearing HENT: OP clear, TM's clear, neck supple PULM: CTA B, normal percussion CV: RRR, no mgr, trace edema GI: BS+, soft, nontender Derm: no cyanosis or rash Psyche: normal mood and affect  CBC    Component Value Date/Time   WBC 7.8 10/08/2022 1040   RBC RESULTS UNAVAILABLE DUE TO INTERFERING SUBSTANCE 10/08/2022 1040   HGB 14.8 10/08/2022 1040   HCT RESULTS UNAVAILABLE DUE TO INTERFERING SUBSTANCE 10/08/2022 1040   PLT 217 10/08/2022 1040   MCV RESULTS UNAVAILABLE DUE TO INTERFERING SUBSTANCE 10/08/2022 1040   MCH RESULTS UNAVAILABLE DUE TO INTERFERING SUBSTANCE 10/08/2022 1040   MCHC RESULTS UNAVAILABLE DUE TO INTERFERING SUBSTANCE 10/08/2022 1040   RDW RESULTS UNAVAILABLE DUE TO INTERFERING SUBSTANCE 10/08/2022 1040   LYMPHSABS 4.8 (H) 07/28/2022 2101   MONOABS 1.0 07/28/2022 2101   EOSABS 0.1 07/28/2022 2101   BASOSABS 0.0 07/28/2022 2101   Impression : Cavitary pneumonia HIV Marijuana smoker  Plan: Bronchoscopy with BAL under MAC  Roselie Awkward, MD [place] P[facility]M Pager: [phone] Cell: [phone] After 7:00 pm call Elink  [phone]

## 2022-10-10 LAB — ACID FAST SMEAR (AFB, MYCOBACTERIA): Acid Fast Smear: NEGATIVE

## 2022-10-12 LAB — CULTURE, RESPIRATORY W GRAM STAIN
Culture: NORMAL
Gram Stain: NONE SEEN

## 2022-10-12 LAB — MISC LABCORP TEST (SEND OUT): Labcorp test code: 183805

## 2022-10-12 LAB — CYTOLOGY - NON PAP

## 2022-10-13 ENCOUNTER — Encounter (HOSPITAL_COMMUNITY): Payer: Self-pay | Admitting: Pulmonary Disease

## 2022-10-28 ENCOUNTER — Ambulatory Visit: Payer: Medicare Other | Admitting: Pulmonary Disease

## 2022-11-02 ENCOUNTER — Ambulatory Visit: Payer: Medicare Other | Admitting: Internal Medicine

## 2022-11-09 ENCOUNTER — Other Ambulatory Visit: Payer: Medicare Other

## 2022-11-09 ENCOUNTER — Other Ambulatory Visit: Payer: Self-pay

## 2022-11-09 DIAGNOSIS — Z79899 Other long term (current) drug therapy: Secondary | ICD-10-CM

## 2022-11-09 DIAGNOSIS — B20 Human immunodeficiency virus [HIV] disease: Secondary | ICD-10-CM

## 2022-11-09 DIAGNOSIS — Z113 Encounter for screening for infections with a predominantly sexual mode of transmission: Secondary | ICD-10-CM

## 2022-11-11 LAB — T-HELPER CELL (CD4) - (RCID CLINIC ONLY)
CD4 % Helper T Cell: 25 % — ABNORMAL LOW (ref 33–65)
CD4 T Cell Abs: 1182 /uL (ref 400–1790)

## 2022-11-12 LAB — CBC WITH DIFFERENTIAL/PLATELET
Absolute Monocytes: 616 cells/uL (ref 200–950)
Basophils Absolute: 26 cells/uL (ref 0–200)
Basophils Relative: 0.3 %
Eosinophils Absolute: 132 cells/uL (ref 15–500)
Eosinophils Relative: 1.5 %
HCT: 42.1 % (ref 38.5–50.0)
Hemoglobin: 14.7 g/dL (ref 13.2–17.1)
Lymphs Abs: 5025 cells/uL — ABNORMAL HIGH (ref 850–3900)
MCH: 34.4 pg — ABNORMAL HIGH (ref 27.0–33.0)
MCHC: 34.9 g/dL (ref 32.0–36.0)
MCV: 98.6 fL (ref 80.0–100.0)
MPV: 10.5 fL (ref 7.5–12.5)
Monocytes Relative: 7 %
Neutro Abs: 3001 cells/uL (ref 1500–7800)
Neutrophils Relative %: 34.1 %
Platelets: 238 10*3/uL (ref 140–400)
RBC: 4.27 10*6/uL (ref 4.20–5.80)
RDW: 13.8 % (ref 11.0–15.0)
Total Lymphocyte: 57.1 %
WBC: 8.8 10*3/uL (ref 3.8–10.8)

## 2022-11-12 LAB — COMPLETE METABOLIC PANEL WITH GFR
AG Ratio: 1.4 (calc) (ref 1.0–2.5)
ALT: 14 U/L (ref 9–46)
AST: 16 U/L (ref 10–40)
Albumin: 4.2 g/dL (ref 3.6–5.1)
Alkaline phosphatase (APISO): 94 U/L (ref 36–130)
BUN: 12 mg/dL (ref 7–25)
CO2: 28 mmol/L (ref 20–32)
Calcium: 9.1 mg/dL (ref 8.6–10.3)
Chloride: 103 mmol/L (ref 98–110)
Creat: 1.12 mg/dL (ref 0.60–1.29)
Globulin: 3 g/dL (calc) (ref 1.9–3.7)
Glucose, Bld: 97 mg/dL (ref 65–99)
Potassium: 3.5 mmol/L (ref 3.5–5.3)
Sodium: 140 mmol/L (ref 135–146)
Total Bilirubin: 0.4 mg/dL (ref 0.2–1.2)
Total Protein: 7.2 g/dL (ref 6.1–8.1)
eGFR: 84 mL/min/{1.73_m2} (ref >=60)

## 2022-11-12 LAB — LIPID PANEL
Cholesterol: 159 mg/dL (ref <200)
HDL: 65 mg/dL (ref >=40)
LDL Cholesterol (Calc): 76 mg/dL (calc)
Non-HDL Cholesterol (Calc): 94 mg/dL (calc) (ref <130)
Total CHOL/HDL Ratio: 2.4 (calc) (ref <5.0)
Triglycerides: 96 mg/dL (ref <150)

## 2022-11-12 LAB — FUNGUS CULTURE WITH STAIN

## 2022-11-12 LAB — RPR: RPR Ser Ql: REACTIVE — AB

## 2022-11-12 LAB — FUNGUS CULTURE RESULT

## 2022-11-12 LAB — HIV-1 RNA QUANT-NO REFLEX-BLD
HIV 1 RNA Quant: <20 Copies/mL — ABNORMAL HIGH
HIV-1 RNA Quant, Log: <1.3 Log cps/mL — ABNORMAL HIGH

## 2022-11-12 LAB — FLUORESCENT TREPONEMAL AB(FTA)-IGG-BLD: Fluorescent Treponemal ABS: REACTIVE — AB

## 2022-11-12 LAB — FUNGAL ORGANISM REFLEX

## 2022-11-12 LAB — RPR TITER: RPR Titer: 1:16 {titer} — ABNORMAL HIGH

## 2022-11-22 LAB — ACID FAST CULTURE WITH REFLEXED SENSITIVITIES (MYCOBACTERIA): Acid Fast Culture: NEGATIVE

## 2022-11-24 ENCOUNTER — Ambulatory Visit (INDEPENDENT_AMBULATORY_CARE_PROVIDER_SITE_OTHER): Payer: Medicare Other | Admitting: Family

## 2022-11-24 ENCOUNTER — Encounter: Payer: Self-pay | Admitting: Family

## 2022-11-24 ENCOUNTER — Other Ambulatory Visit: Payer: Self-pay

## 2022-11-24 VITALS — BP 104/68 | HR 80 | Ht 64.0 in | Wt 131.6 lb

## 2022-11-24 DIAGNOSIS — Z21 Asymptomatic human immunodeficiency virus [HIV] infection status: Secondary | ICD-10-CM | POA: Diagnosis not present

## 2022-11-24 DIAGNOSIS — Z0000 Encounter for general adult medical examination without abnormal findings: Secondary | ICD-10-CM | POA: Diagnosis not present

## 2022-11-24 DIAGNOSIS — J988 Other specified respiratory disorders: Secondary | ICD-10-CM

## 2022-11-24 DIAGNOSIS — B20 Human immunodeficiency virus [HIV] disease: Secondary | ICD-10-CM

## 2022-11-24 MED ORDER — PREDNISONE 10 MG (21) PO TBPK: ORAL_TABLET | ORAL | 0 refills | Status: DC

## 2022-11-24 MED ORDER — PREZCOBIX 800-150 MG PO TABS: ORAL_TABLET | ORAL | 5 refills | Status: DC

## 2022-11-24 MED ORDER — ALBUTEROL SULFATE HFA 108 (90 BASE) MCG/ACT IN AERS: 2.0000 | INHALATION_SPRAY | Freq: Four times a day (QID) | RESPIRATORY_TRACT | 2 refills | Status: DC | PRN

## 2022-11-24 NOTE — Patient Instructions (Addendum)
Nice to see you.  Continue to take your medication daily as prescribed.  Refills have been sent to the pharmacy.  Plan for follow up with Dr. Drue Second in 4 months or sooner if needed with lab work 1-2 weeks prior to appointment.   Have a great day and stay safe!

## 2022-11-24 NOTE — Progress Notes (Signed)
Brief Narrative   Patient ID: Rodney Walker, male    DOB: 06-Jun-1981, 41 y.o.   MRN: [id number]  Rodney Walker is a 41 y/o AA male diagnosed with HIV disease in March 2015 with risk factor of MSM. Initial CD4 count of 20 and viral load 366,287. Genosure with K103N resistance (efavirenz and nevirapine). [id number] negative. Entered care at [facility] Stage 3. ART experienced with Tivicay, Descovy, Truvada, Raltegravir, and now Biktarvy.   Subjective:    No chief complaint on file.   HPI:  Rodney Walker is a 41 y.o. male with HIV disease last seen on 08/24/22 by Dr. Drue Second for routine follow up with well controlled virus and good adherence and tolerance to Biktarvy. Viral load was undetectable and CD4 count 789. Most recent lab work completed on 11/09/22 with viral load that remains undetectable and CD4 count 1,182. Kidney function, liver function and electrolytes within normal ranges. RPR titer positive at 1:16 (previous treatment at 1:256). Here today for routine follow up.   Having some congestion for about 2 months; no fevers chills, some coughing; slowly improving over time; Zyrtec which has not helped very much.   Condoms; No STD testing;  No Known Allergies    Outpatient Medications Prior to Visit  Medication Sig Dispense Refill  . albuterol (VENTOLIN HFA) 108 (90 Base) MCG/ACT inhaler Inhale 2 puffs into the lungs every 6 (six) hours as needed for wheezing or shortness of breath. 8 g 2  . aspirin EC 81 MG tablet Take 81 mg by mouth daily. Swallow whole.    . bictegravir-emtricitabine-tenofovir AF (BIKTARVY) 50-200-25 MG TABS tablet Take 1 tablet by mouth daily. 30 tablet 11  . cetirizine (ZYRTEC) 10 MG tablet TAKE 1 TABLET(10 MG) BY MOUTH DAILY 30 tablet 11  . EPINEPHrine 0.3 mg/0.3 mL IJ SOAJ injection Inject 0.3 mg into the muscle as needed for anaphylaxis.    . flunisolide (NASALIDE) 25 MCG/ACT (0.025%) SOLN Place 2 sprays into the nose daily as needed  (allergies).    . hydrocortisone 1 % ointment Apply 1 application. topically 2 (two) times daily. To right wrist (Patient not taking: Reported on 09/21/2022) 30 g 0  . metoprolol succinate (TOPROL-XL) 25 MG 24 hr tablet Take 1 tablet (25 mg total) by mouth daily. Please make overdue appt with Dr. Elease Hashimoto before anymore refills. Thank you 2nd attempt 15 tablet 0  . nitroGLYCERIN (NITROSTAT) 0.4 MG SL tablet Place 1 tablet (0.4 mg total) under the tongue every 5 (five) minutes x 3 doses as needed for chest pain. 25 tablet 0  . prasugrel (EFFIENT) 10 MG TABS tablet Take 10 mg by mouth daily.    Marland Kitchen PREZCOBIX 800-150 MG tablet TAKE 1 TABLET BY MOUTH DAILY WITH BREAKFAST. SWALLOW WHOLE. DO NOT CRUSH, BREAK OR CHEW TABLETS. TAKE WITH FOOD 30 tablet 5  . rosuvastatin (CRESTOR) 10 MG tablet Take 10 mg by mouth every evening.     No facility-administered medications prior to visit.     Past Medical History:  Diagnosis Date  . Anemia   . COPD (chronic obstructive pulmonary disease) (HCC)   . Coronary artery disease    MI 2010 - dissection/plaque rupture in LAD tx with antiplt/antithrombotics >> relook cath with resolution (no PCI performed) // Inf STEMI 3/19: OM1 30; pRCA 100 >> DES // Echo 3/19:  mild LVH, EF 40-45, inf/inf-sept HK, trivial MR  . Eczema   . GERD (gastroesophageal reflux disease)   . History of blood clots   .  History of myocardial infarction 2010; 2019  . History of pleural empyema   . HIV infection (HCC)   . Hyperlipidemia   . Hypertension   . Immune deficiency disorder (HCC)   . Ischemic cardiomyopathy 04/05/2018  . Moderate persistent asthma without complication 10/29/2016  . Myocardial infarction (HCC)    2009, 2019  . Pneumonia      Past Surgical History:  Procedure Laterality Date  . CARDIAC CATHETERIZATION  06/24/2009   EF 60%  . CARDIAC CATHETERIZATION  06/17/2009   EF 45%. ANTERIOR HYPOKINESIS  . CORONARY STENT INTERVENTION N/A 03/24/2018   Procedure: CORONARY  STENT INTERVENTION;  Surgeon: Tonny Bollman, MD;  Location: [facility] INVASIVE CV LAB;  Service: Cardiovascular;  Laterality: N/A;  . CORONARY/GRAFT ACUTE MI REVASCULARIZATION N/A 03/24/2018   Procedure: Coronary/Graft Acute MI Revascularization;  Surgeon: Tonny Bollman, MD;  Location: [facility] INVASIVE CV LAB;  Service: Cardiovascular;  Laterality: N/A;  . LEFT HEART CATH AND CORONARY ANGIOGRAPHY N/A 03/24/2018   Procedure: LEFT HEART CATH AND CORONARY ANGIOGRAPHY;  Surgeon: Tonny Bollman, MD;  Location: [facility] INVASIVE CV LAB;  Service: Cardiovascular;  Laterality: N/A;  . SINUS IRRIGATION  10/08/2022   Procedure: BRONCHIAL LAVAGE;  Surgeon: Lupita Leash, MD;  Location: [facility] ENDOSCOPY;  Service: Cardiopulmonary;;  . TOTAL HIP ARTHROPLASTY Right 09/25/2019   Procedure: RIGHT TOTAL HIP ARTHROPLASTY ANTERIOR APPROACH;  Surgeon: Tarry Kos, MD;  Location: [facility] OR;  Service: Orthopedics;  Laterality: Right;  . US ECHOCARDIOGRAPHY  12/09/2009   EF 55-60%  . VIDEO ASSISTED THORACOSCOPY (VATS)/ LOBECTOMY Right 04/23/2015   Procedure: VIDEO ASSISTED THORACOSCOPY (VATS)/RIGHT upper  and right middle LOBECTOMY;  Surgeon: Kerin Perna, MD;  Location: [facility] OR;  Service: Thoracic;  Laterality: Right;  Marland Kitchen VIDEO BRONCHOSCOPY Bilateral 09/05/2014   Procedure: VIDEO BRONCHOSCOPY WITH FLUORO;  Surgeon: Merwyn Katos, MD;  Location: [facility] ENDOSCOPY;  Service: Cardiopulmonary;  Laterality: Bilateral;  . VIDEO BRONCHOSCOPY Bilateral 04/10/2015   Procedure: VIDEO BRONCHOSCOPY WITH FLUORO;  Surgeon: Lupita Leash, MD;  Location: [facility] ENDOSCOPY;  Service: Cardiopulmonary;  Laterality: Bilateral;  . VIDEO BRONCHOSCOPY N/A 10/08/2022   Procedure: VIDEO BRONCHOSCOPY WITHOUT FLUORO;  Surgeon: Lupita Leash, MD;  Location: [facility] ENDOSCOPY;  Service: Cardiopulmonary;  Laterality: N/A;      Review of Systems    Objective:    There were no vitals taken for this visit. Nursing note and vital signs reviewed.  Physical  Exam      09/18/2022    2:43 PM 08/24/2022   10:17 AM 05/26/2022    4:15 PM 02/19/2022    4:25 PM 11/13/2021    4:22 PM  Depression screen PHQ 2/9  Decreased Interest 0 0 0 0 0  Down, Depressed, Hopeless 0 0 0 0 0  PHQ - 2 Score 0 0 0 0 0       Assessment & Plan:    Patient Active Problem List   Diagnosis Date Noted  . 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 Hemorrhoid 09/30/2020  . Avascular necrosis of bone of right hip (HCC) 09/25/2019  . 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 Hyperlipidemia 04/05/2018  . Ischemic cardiomyopathy 04/05/2018  . History of acute inferior wall MI 03/24/2018  . Marijuana abuse, continuous 10/29/2016  . Anemia 05/03/2015  . Pulmonary emphysema (HCC)   . HIV (human immunodeficiency virus infection) (HCC)   . Tobacco abuse 03/21/2015  . Pulmonary nodule 08/31/2014  . 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 AIDS (HCC) 03/22/2014  .  CAD (coronary artery disease) 11/13/2011     Problem List Items Addressed This Visit   None    I am having Rodney Walker maintain his nitroGLYCERIN, albuterol, metoprolol succinate, cetirizine, Biktarvy, hydrocortisone, Prezcobix, flunisolide, aspirin EC, rosuvastatin, prasugrel, and EPINEPHrine.   No orders of the defined types were placed in this encounter.    Follow-up: No follow-ups on file.   Marcos Eke, MSN, FNP-C Nurse Practitioner [facility] Main number: [phone]

## 2022-11-25 DIAGNOSIS — J988 Other specified respiratory disorders: Secondary | ICD-10-CM | POA: Insufficient documentation

## 2022-11-25 DIAGNOSIS — Z0000 Encounter for general adult medical examination without abnormal findings: Secondary | ICD-10-CM | POA: Insufficient documentation

## 2022-11-25 NOTE — Assessment & Plan Note (Signed)
Rodney Walker has continued congestion and productive cough which does not appear to be infectious in the setting of his previous lung problems. Will refill albuterol and give short course of steroid to see if this improves his symptoms. He has follow up with pulmonology on 12/21. Continue OTC medications as needed for symptom relief and supportive care. Follow up for worsening of symptoms or if fever develops.

## 2022-11-25 NOTE — Assessment & Plan Note (Signed)
Discussed importance of safe sexual practice and condom use. Condoms and STD testing offered.  May benefit from RSV vaccination secondary to underlying pulmonary issues - CD4 count is normal.

## 2022-11-25 NOTE — Assessment & Plan Note (Signed)
Stephane continues to have well controlled virus with good adherence and tolerance to Biktarvy and Prezcobix. Reviewed  lab work and discussed plan of care. After review of previous Genosure and Archive, K103 is the only medication resistant mutation. Will discuss with Dr. Drue Second about continued need for Prezcobix as it appears he should be fine with just Biktarvy. Continue current dose of Biktarvy and Prezcobix for now. Plan for follow up in 4 months or sooner if needed with lab work 1-2 weeks prior to appointment.

## 2022-12-17 ENCOUNTER — Ambulatory Visit (INDEPENDENT_AMBULATORY_CARE_PROVIDER_SITE_OTHER): Payer: Medicare Other | Admitting: Pulmonary Disease

## 2022-12-17 ENCOUNTER — Encounter: Payer: Self-pay | Admitting: Pulmonary Disease

## 2022-12-17 VITALS — BP 110/72 | HR 90 | Temp 98.3°F | Ht 64.0 in | Wt 133.0 lb

## 2022-12-17 DIAGNOSIS — B20 Human immunodeficiency virus [HIV] disease: Secondary | ICD-10-CM | POA: Diagnosis not present

## 2022-12-17 DIAGNOSIS — J189 Pneumonia, unspecified organism: Secondary | ICD-10-CM

## 2022-12-17 DIAGNOSIS — J432 Centrilobular emphysema: Secondary | ICD-10-CM

## 2022-12-17 DIAGNOSIS — R918 Other nonspecific abnormal finding of lung field: Secondary | ICD-10-CM | POA: Diagnosis not present

## 2022-12-17 DIAGNOSIS — J984 Other disorders of lung: Secondary | ICD-10-CM

## 2022-12-17 LAB — PULMONARY FUNCTION TEST
DL/VA % pred: 45 %
DL/VA: 2.14 ml/min/mmHg/L
DLCO cor % pred: 35 %
DLCO cor: 8.98 ml/min/mmHg
DLCO unc % pred: 35 %
DLCO unc: 9 ml/min/mmHg
FEF 25-75 Post: 0.86 L/sec
FEF 25-75 Pre: 1.11 L/sec
FEF2575-%Change-Post: -22 %
FEF2575-%Pred-Post: 25 %
FEF2575-%Pred-Pre: 33 %
FEV1-%Change-Post: -4 %
FEV1-%Pred-Post: 55 %
FEV1-%Pred-Pre: 57 %
FEV1-Post: 1.9 L
FEV1-Pre: 1.99 L
FEV1FVC-%Change-Post: 0 %
FEV1FVC-%Pred-Pre: 82 %
FEV6-%Change-Post: -5 %
FEV6-%Pred-Post: 68 %
FEV6-%Pred-Pre: 72 %
FEV6-Post: 2.85 L
FEV6-Pre: 3 L
FEV6FVC-%Change-Post: 0 %
FEV6FVC-%Pred-Post: 101 %
FEV6FVC-%Pred-Pre: 101 %
FVC-%Change-Post: -4 %
FVC-%Pred-Post: 67 %
FVC-%Pred-Pre: 70 %
FVC-Post: 2.89 L
FVC-Pre: 3.03 L
Post FEV1/FVC ratio: 66 %
Post FEV6/FVC ratio: 99 %
Pre FEV1/FVC ratio: 66 %
Pre FEV6/FVC Ratio: 99 %
RV % pred: 94 %
RV: 1.46 L
TLC % pred: 83 %
TLC: 4.79 L

## 2022-12-17 MED ORDER — TRELEGY ELLIPTA 100-62.5-25 MCG/ACT IN AEPB: 1.0000 | INHALATION_SPRAY | Freq: Every day | RESPIRATORY_TRACT | 0 refills | Status: DC

## 2022-12-17 MED ORDER — DOXYCYCLINE HYCLATE 100 MG PO TABS: 100.0000 mg | ORAL_TABLET | Freq: Two times a day (BID) | ORAL | 0 refills | Status: DC

## 2022-12-17 NOTE — Patient Instructions (Signed)
COPD with emphysema: Stop smoking Try taking Trelegy 1 puff daily no matter how you feel, if you find that this is helpful call me so that I can call in a prescription Continue albuterol as needed for chest tightness wheezing or shortness of breath If your symptoms of shortness of breath, wheezing and chest tightness worsen take the prednisone you were prescribed: 20 mg daily x 5 days If the mucus production increases or if it changes in color then take the doxycycline prescription I gave you  Abnormality seen in the right middle lobe on CT scan of the chest: Repeat CT chest in February  Follow-up with me after the CT in February, sooner if needed

## 2022-12-17 NOTE — Progress Notes (Signed)
Synopsis: Mr. Rodney Walker has HIV AIDS and was hospitalized in the fall of 2015 in the setting of a right upper lobe lung mass with mediastinal lymphadenopathy. The mass was visible on bronchoscopy and multiple endobronchial biopsies were taken as were cultures including AFB culture. Biopsy results showed nonspecific inflammatory changes in the AFB culture was negative. He ultimately underwent an right upper lobe decortication and lobectomy. Pathology showed necrotizing granuloma in the right upper lobe mass and mediastinal lymph nodes with negative special stains for infectious etiologies.  Referred in September 2023 for pulmonary nodule in background of emphysema.  Prior history of right upper lobectomy.  HIV positive, followed by infectious diseases clinic.  Subjective:   PATIENT ID: Rodney Walker GENDER: male DOB: [birth year], MRN: [id number]   HPI  Chief Complaint  Patient presents with   Follow-up    PFT 12/17/2022 SOB,wheezing and coughing    Rodney Walker continues to struggle from coughing, wheezing and occassional mucus production. The CT scan of his chest from this year showed an ill-defined scar in the right middle lobe. No fevers, chills.  Recently prescribed prednisone but he has not taken it.  He has had a couple episodes of shortness of breath, 1 was when he was running out of his house and back in very quickly he says he felt like he was running a marathon.  However, in general he says he is not slowing down, does not feel like he is limited by his breathing.  He has been prescribed albuterol but he does not use it very often.  Past Medical History:  Diagnosis Date   Anemia    COPD (chronic obstructive pulmonary disease) ([place])    Coronary artery disease    MI 2010 - dissection/plaque rupture in LAD tx with antiplt/antithrombotics >> relook cath with resolution (no PCI performed) // Inf STEMI 3/19: OM1 30; pRCA 100 >> DES // Echo 3/19:  mild LVH, EF 40-45, inf/inf-sept  HK, trivial MR   Eczema    GERD (gastroesophageal reflux disease)    History of blood clots    History of myocardial infarction 2010; 2019   History of pleural empyema    HIV infection ([place])    Hyperlipidemia    Hypertension    Immune deficiency disorder ([place])    Ischemic cardiomyopathy 04/05/2018   Moderate persistent asthma without complication 40/34/7425   Myocardial infarction ([place])    2009, 2019   Pneumonia       Review of Systems  Constitutional:  Negative for chills, fever, malaise/fatigue and weight loss.  HENT:  Negative for congestion, sinus pain and sore throat.   Respiratory:  Positive for cough, sputum production and shortness of breath.   Cardiovascular:  Negative for chest pain and leg swelling.       Objective:  Physical Exam   Vitals:   12/17/22 1600  BP: 110/72  Pulse: 90  Temp: 98.3 F (36.8 C)  TempSrc: Oral  SpO2: 97%  Weight: 133 lb (60.3 kg)  Height: _0  (1.626 m)    Gen: well appearing HENT: OP clear, neck supple PULM: CTA B, normal effort  CV: RRR, no mgr GI: BS+, soft, nontender Derm: no cyanosis or rash Psyche: normal mood and affect     CBC    Component Value Date/Time   WBC 8.8 11/09/2022 1607   RBC 4.27 11/09/2022 1607   HGB 14.7 11/09/2022 1607   HCT 42.1 11/09/2022 1607   PLT 238 11/09/2022 1607   MCV 98.6 11/09/2022  [place] 34.4 (H) 11/09/2022 1607   MCHC 34.9 11/09/2022 1607   RDW 13.8 11/09/2022 1607   LYMPHSABS 5,025 (H) 11/09/2022 1607   MONOABS 1.0 07/28/2022 2101   EOSABS 132 11/09/2022 1607   BASOSABS 26 11/09/2022 1607     Chest imaging: July 28, 2022 CT angiogram chest showed no pulmonary embolism, moderate to severe bullous emphysema, status post right upper lobectomy, 4 mm left upper lobe nodule, bronchiectasis in right middle lobe.  PFT: 2017 spirometry ratio 64% predicted, FEV1 2.49 L 89% predicted, 23% change with bronchodilator. December 2023 ratio 66%, FEV1 1.90 L 55% predicted, total  lung capacity 4.7 983% predicted, DLCO 9.0 35% predicted  Labs: 2015 Alpha 1 140 (normal) October 2023 BAL galactomannan negative, cell count neutrophil predominant, bacterial, fungal, AFB cultures all negative  Path: 03/2015 right upper lobectomy Lung, resection (segmental or lobe), Right upper lobe and middle lobe - NECROTIZING GRANULOMATOUS INFLAMMATION, SEE COMMENT. - BRONCHOVASCULAR MARGIN, NEGATIVE FOR ATYPIA OR MALIGNANCY. - TWO LYMPH NODES, NEGATIVE FOR TUMOR. - ONE LYMPH NODE, POSITIVE FOR NECROTIZING GRANULOMATOUS INFLAMMATION. GMS, AFB, and PAS stains were performed on representative sections from lung and lymph node (blocks 1D and 70M). Although the stains demonstrate no evidence of yeast, fungi, or Mycobacterium, an infectious etiology remains in the differential.  Echo:  Heart Catheterization:       Assessment & Plan:   Centrilobular emphysema ([place])  Cavitary pneumonia  Lung mass  Currently asymptomatic HIV infection, with history of HIV-related illness ([place])  Discussion: Rodney Walker has worsening COPD, bronchoscopy showed no evidence of an infectious problem.  He desperately needs to stop smoking.  He needs to be on better inhaled therapy given the severity of his symptoms.  Plan: COPD with emphysema: Stop smoking Try taking Trelegy 1 puff daily no matter how you feel, if you find that this is helpful call me so that I can call in a prescription Continue albuterol as needed for chest tightness wheezing or shortness of breath If your symptoms of shortness of breath, wheezing and chest tightness worsen take the prednisone you were prescribed: 20 mg daily x 5 days If the mucus production increases or if it changes in color then take the doxycycline prescription I gave you  Abnormality seen in the right middle lobe on CT scan of the chest: Repeat CT chest in February  Follow-up with me after the CT in February, sooner if needed   Immunizations: Immunization  History  Administered Date(s) Administered   HPV 9-valent 08/08/2018, 09/05/2018, 02/03/2019   Hepatitis A, Adult 09/26/2014, 03/14/2015   Hepatitis B, adult 09/26/2014, 01/23/2015, 03/14/2015   Influenza,inj,Quad PF,6+ Mos 09/26/2014, 10/16/2015, 10/08/2016, 10/06/2017, 11/07/2018, 10/02/2019, 09/19/2020, 11/13/2021, 08/24/2022   Meningococcal Mcv4o 08/08/2018, 11/07/2018   PFIZER Comirnaty(Gray Top)Covid-19 Tri-Sucrose Vaccine 08/27/2021   PFIZER(Purple Top)SARS-COV-2 Vaccination 04/03/2020, 04/24/2020, 11/08/2020   PNEUMOCOCCAL CONJUGATE-20 02/19/2022   Pneumococcal Conjugate-13 05/05/2018   Pneumococcal Polysaccharide-23 09/26/2014   Tdap 10/28/2016     Current Outpatient Medications:    albuterol (VENTOLIN HFA) 108 (90 Base) MCG/ACT inhaler, Inhale 2 puffs into the lungs every 6 (six) hours as needed for wheezing or shortness of breath., Disp: 8 g, Rfl: 2   aspirin EC 81 MG tablet, Take 81 mg by mouth daily. Swallow whole., Disp: , Rfl:    bictegravir-emtricitabine-tenofovir AF (BIKTARVY) 50-200-25 MG TABS tablet, Take 1 tablet by mouth daily., Disp: 30 tablet, Rfl: 11   cetirizine (ZYRTEC) 10 MG tablet, TAKE 1 TABLET(10 MG) BY MOUTH DAILY, Disp: 30  tablet, Rfl: 11   darunavir-cobicistat (PREZCOBIX) 800-150 MG tablet, Swallow whole. Do NOT crush, break or chew tablets. Take with food., Disp: 30 tablet, Rfl: 5   doxycycline (VIBRA-TABS) 100 MG tablet, Take 1 tablet (100 mg total) by mouth 2 (two) times daily., Disp: 10 tablet, Rfl: 0   EPINEPHrine 0.3 mg/0.3 mL IJ SOAJ injection, Inject 0.3 mg into the muscle as needed for anaphylaxis., Disp: , Rfl:    flunisolide (NASALIDE) 25 MCG/ACT (0.025%) SOLN, Place 2 sprays into the nose daily as needed (allergies)., Disp: , Rfl:    hydrocortisone 1 % ointment, Apply 1 application. topically 2 (two) times daily. To right wrist, Disp: 30 g, Rfl: 0   metoprolol succinate (TOPROL-XL) 25 MG 24 hr tablet, Take 1 tablet (25 mg total) by mouth daily.  Please make overdue appt with Dr. Acie Fredrickson before anymore refills. Thank you 2nd attempt, Disp: 15 tablet, Rfl: 0   nitroGLYCERIN (NITROSTAT) 0.4 MG SL tablet, Place 1 tablet (0.4 mg total) under the tongue every 5 (five) minutes x 3 doses as needed for chest pain., Disp: 25 tablet, Rfl: 0   prasugrel (EFFIENT) 10 MG TABS tablet, Take 10 mg by mouth daily., Disp: , Rfl:    rosuvastatin (CRESTOR) 10 MG tablet, Take 10 mg by mouth every evening., Disp: , Rfl:    predniSONE (STERAPRED UNI-PAK 21 TAB) 10 MG (21) TBPK tablet, Take per package instructions (Patient not taking: Reported on 12/17/2022), Disp: 21 each, Rfl: 0

## 2022-12-17 NOTE — Progress Notes (Signed)
Full PFT performed today. °

## 2022-12-17 NOTE — Patient Instructions (Signed)
Full PFT performed today. °

## 2023-01-03 ENCOUNTER — Other Ambulatory Visit: Payer: Self-pay | Admitting: Emergency Medicine

## 2023-02-08 ENCOUNTER — Encounter (HOSPITAL_COMMUNITY): Payer: Self-pay

## 2023-02-08 ENCOUNTER — Ambulatory Visit (HOSPITAL_COMMUNITY)
Admission: EM | Admit: 2023-02-08 | Discharge: 2023-02-08 | Disposition: A | Discharge status: Home / Self Care | Payer: 59 | Attending: Internal Medicine | Admitting: Internal Medicine

## 2023-02-08 DIAGNOSIS — K219 Gastro-esophageal reflux disease without esophagitis: Secondary | ICD-10-CM

## 2023-02-08 MED ORDER — CIMETIDINE 400 MG PO TABS: 400.0000 mg | ORAL_TABLET | Freq: Two times a day (BID) | ORAL | 0 refills | Status: DC

## 2023-02-08 NOTE — Discharge Instructions (Signed)
Please follow up with your primary care doctor in 10 days for follow up and to make sure you are better and not needing to see a stomach specialist. If you get worse in 24 hours, please go to the ER to have further test which we can't do here.

## 2023-02-08 NOTE — ED Triage Notes (Signed)
Patient states he is having GERD that has worsened since yesterday.   Patient denies taking any medications for this.

## 2023-02-08 NOTE — ED Provider Notes (Signed)
[place]    CSN: [id number] Arrival date & time: 02/08/23  1935      History   Chief Complaint Chief Complaint  Patient presents with   Gastroesophageal Reflux    HPI Rodney Walker is a 42 y.o. male who presents due to having heart burn after eating Hibachi and got worse yesterday after eating shrimp and barBQ meat balls. Has not taken anything since he does not know what may interact with his HIV meds. It has been several months since he had had similar symptoms when he was dealing with GERD in the past.     Past Medical History:  Diagnosis Date   Anemia    COPD (chronic obstructive pulmonary disease) ([place])    Coronary artery disease    MI 2010 - dissection/plaque rupture in LAD tx with antiplt/antithrombotics >> relook cath with resolution (no PCI performed) // Inf STEMI 3/19: OM1 30; pRCA 100 >> DES // Echo 3/19:  mild LVH, EF 40-45, inf/inf-sept HK, trivial MR   Eczema    GERD (gastroesophageal reflux disease)    History of blood clots    History of myocardial infarction 2010; 2019   History of pleural empyema    HIV infection ([place])    Hyperlipidemia    Hypertension    Immune deficiency disorder ([place])    Ischemic cardiomyopathy 04/05/2018   Moderate persistent asthma without complication [id number]   Myocardial infarction ([place])    2009, 2019   Pneumonia     Patient Active Problem List   Diagnosis Date Noted   Congestion of respiratory tract 11/25/2022   Healthcare maintenance 11/25/2022   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   Hemorrhoid 09/30/2020   Avascular necrosis of bone of right hip ([place]) 09/25/2019   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   Hyperlipidemia 04/05/2018   Ischemic cardiomyopathy 04/05/2018   History of acute inferior wall MI 03/24/2018   Marijuana abuse, continuous 10/29/2016   Anemia 05/03/2015   Pulmonary emphysema ([place])    HIV (human immunodeficiency virus infection) ([place])     Tobacco abuse 03/21/2015   Pulmonary nodule [id number]   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 ANTERIOR HYPOKINESIS   CORONARY STENT INTERVENTION N/A 03/24/2018   Procedure: CORONARY STENT INTERVENTION;  Surgeon: Sherren Mocha, MD;  Location: [place] [facility];  Service: Cardiovascular;  Laterality: N/A;   CORONARY/GRAFT ACUTE MI REVASCULARIZATION N/A 03/24/2018   Procedure: Coronary/Graft Acute MI Revascularization;  Surgeon: Sherren Mocha, MD;  Location: [place] [facility];  Service: Cardiovascular;  Laterality: N/A;   LEFT HEART CATH AND CORONARY ANGIOGRAPHY N/A 03/24/2018   Procedure: LEFT HEART CATH AND CORONARY ANGIOGRAPHY;  Surgeon: Sherren Mocha, MD;  Location: [place] [facility];  Service: Cardiovascular;  Laterality: N/A;   SINUS IRRIGATION  10/08/2022   Procedure: BRONCHIAL LAVAGE;  Surgeon: Juanito Doom, MD;  Location: [place];  Service: Cardiopulmonary;;   TOTAL HIP ARTHROPLASTY Right 09/25/2019   Procedure: RIGHT TOTAL HIP ARTHROPLASTY ANTERIOR APPROACH;  Surgeon: Leandrew Koyanagi, MD;  Location: [place];  Service: Orthopedics;  Laterality: Right;   US ECHOCARDIOGRAPHY  12/09/2009   EF 55-60%   VIDEO ASSISTED THORACOSCOPY (VATS)/ LOBECTOMY Right 04/23/2015   Procedure: VIDEO ASSISTED THORACOSCOPY (VATS)/RIGHT upper  and right  middle LOBECTOMY;  Surgeon: Ivin Poot, MD;  Location: [place];  Service: Thoracic;  Laterality: Right;   VIDEO BRONCHOSCOPY Bilateral 09/05/2014   Procedure: VIDEO BRONCHOSCOPY WITH FLUORO;  Surgeon: Wilhelmina Mcardle, MD;  Location: [facility] ENDOSCOPY;  Service: Cardiopulmonary;  Laterality: Bilateral;   VIDEO BRONCHOSCOPY Bilateral 04/10/2015   Procedure: VIDEO BRONCHOSCOPY WITH FLUORO;  Surgeon: Juanito Doom, MD;  Location: [facility]  ENDOSCOPY;  Service: Cardiopulmonary;  Laterality: Bilateral;   VIDEO BRONCHOSCOPY N/A 10/08/2022   Procedure: VIDEO BRONCHOSCOPY WITHOUT FLUORO;  Surgeon: Juanito Doom, MD;  Location: [place];  Service: Cardiopulmonary;  Laterality: N/A;       Home Medications    Prior to Admission medications   Medication Sig Start Date End Date Taking? Authorizing Provider  cimetidine (TAGAMET) 400 MG tablet Take 1 tablet (400 mg total) by mouth 2 (two) times daily for 15 days. 02/08/23 02/23/23 Yes Rodriguez-Southworth, Sunday Spillers, PA-C  albuterol (VENTOLIN HFA) 108 (90 Base) MCG/ACT inhaler Inhale 2 puffs into the lungs every 6 (six) hours as needed for wheezing or shortness of breath. 11/24/22   Golden Circle, FNP  aspirin EC 81 MG tablet Take 81 mg by mouth daily. Swallow whole.    [provider]  bictegravir-emtricitabine-tenofovir AF (BIKTARVY) 50-200-25 MG TABS tablet Take 1 tablet by mouth daily. 05/26/22   Carlyle Basques, MD  cetirizine (ZYRTEC) 10 MG tablet TAKE 1 TABLET(10 MG) BY MOUTH DAILY 03/02/22   Carlyle Basques, MD  darunavir-cobicistat (PREZCOBIX) 800-150 MG tablet Swallow whole. Do NOT crush, break or chew tablets. Take with food. 11/24/22   Golden Circle, FNP  EPINEPHrine 0.3 mg/0.3 mL IJ SOAJ injection Inject 0.3 mg into the muscle as needed for anaphylaxis.    [provider]  flunisolide (NASALIDE) 25 MCG/ACT (0.025%) SOLN Place 2 sprays into the nose daily as needed (allergies). 09/17/22   [provider]  Fluticasone-Umeclidin-Vilant (TRELEGY ELLIPTA) 100-62.5-25 MCG/ACT AEPB Inhale 1 puff into the lungs daily. 12/17/22   Juanito Doom, MD  metoprolol succinate (TOPROL-XL) 25 MG 24 hr tablet Take 1 tablet (25 mg total) by mouth daily. Please make overdue appt with Dr. Acie Fredrickson before anymore refills. Thank you 2nd attempt 02/19/22   [name], Wonda Cheng, MD  nitroGLYCERIN (NITROSTAT) 0.4 MG SL tablet Place 1 tablet (0.4 mg total) under the tongue  every 5 (five) minutes x 3 doses as needed for chest pain. 06/27/20   [name], Wonda Cheng, MD  prasugrel (EFFIENT) 10 MG TABS tablet Take 10 mg by mouth daily.    [provider]    Family History Family History  Problem Relation Age of Onset   Diabetes Mother    Eczema Mother    Asthma Mother    COPD Mother    Hypertension Mother    Hyperlipidemia Mother    Stroke Mother    Heart disease Mother    Sleep apnea Mother    Liver cancer Father    Asthma Brother    Diabetes Maternal Grandmother    Heart failure Maternal Grandmother     Social History Social History   Tobacco Use   Smoking status: Former    Packs/day: 0.25    Years: 17.00    Total pack years: 4.25    Types: Cigarettes, Cigars    Quit date: 03/23/2015    Years since quitting: 7.8   Smokeless tobacco: Never  Vaping Use   Vaping Use: Never used  Substance Use Topics   Alcohol use: Yes  Comment: occasional   Drug use: Yes    Frequency: 7.0 times per week    Types: Marijuana    Comment: daily     Allergies   Patient has no known allergies.   Review of Systems Review of Systems  Constitutional:  Negative for fever.  Respiratory:  Negative for cough.   Cardiovascular:  Negative for chest pain.  Gastrointestinal:  Positive for nausea. Negative for abdominal pain and blood in stool.     Physical Exam Triage Vital Signs ED Triage Vitals  Enc Vitals Group     BP 02/08/23 1958 119/81     Pulse Rate 02/08/23 1958 76     Resp 02/08/23 1958 16     Temp 02/08/23 1958 98 F (36.7 C)     Temp Source 02/08/23 1958 Oral     SpO2 02/08/23 1958 95 %     Weight --      Height --      Head Circumference --      Peak Flow --      Pain Score 02/08/23 2000 0     Pain Loc --      Pain Edu? --      Excl. in [place]? --    No data found.  Updated Vital Signs BP 119/81 (BP Location: Right Arm)   Pulse 76   Temp 98 F (36.7 C) (Oral)   Resp 16   SpO2 95%   Visual Acuity Right Eye Distance:   Left  Eye Distance:   Bilateral Distance:    Right Eye Near:   Left Eye Near:    Bilateral Near:     Physical Exam Vitals and nursing note reviewed.  Constitutional:      General: He is not in acute distress.    Appearance: He is normal weight. He is not toxic-appearing.  HENT:     Right Ear: External ear normal.     Left Ear: External ear normal.  Eyes:     General: No scleral icterus.    Conjunctiva/sclera: Conjunctivae normal.  Cardiovascular:     Rate and Rhythm: Normal rate and regular rhythm.     Heart sounds: No murmur heard. Pulmonary:     Effort: Pulmonary effort is normal.     Breath sounds: Normal breath sounds.  Abdominal:     General: Abdomen is flat. Bowel sounds are normal.     Palpations: Abdomen is soft. There is no mass.     Tenderness: There is no abdominal tenderness. There is no guarding or rebound.  Musculoskeletal:     Cervical back: Neck supple.  Skin:    General: Skin is warm and dry.  Neurological:     Mental Status: He is alert and oriented to person, place, and time.     Gait: Gait normal.  Psychiatric:        Mood and Affect: Mood normal.        Behavior: Behavior normal.        Thought Content: Thought content normal.        Judgment: Judgment normal.      UC Treatments / Results  Labs (all labs ordered are listed, but only abnormal results are displayed) Labs Reviewed - No data to display  EKG   Radiology No results found.  Procedures Procedures (including critical care time)  Medications Ordered in UC Medications - No data to display  Initial Impression / Assessment and Plan / UC Course  I have reviewed the  triage vital signs and the nursing notes.  GERD  I placed him on Tagamet as noted. Advised to take it qd for 3-5 days, then prior to eating foods that trigger his GERD. See instructions.      Final Clinical Impressions(s) / UC Diagnoses   Final diagnoses:  Gastroesophageal reflux disease, unspecified whether  esophagitis present     Discharge Instructions      Please follow up with your primary care doctor in 10 days for follow up and to make sure you are better and not needing to see a stomach specialist. If you get worse in 24 hours, please go to the ER to have further test which we can't do here.      ED Prescriptions     Medication Sig Dispense Auth. Provider   cimetidine (TAGAMET) 400 MG tablet Take 1 tablet (400 mg total) by mouth 2 (two) times daily for 15 days. 30 tablet Rodriguez-Southworth, Sunday Spillers, PA-C      PDMP not reviewed this encounter.   Shelby Mattocks, Hershal Coria 02/08/23 2038

## 2023-02-15 ENCOUNTER — Encounter (HOSPITAL_COMMUNITY): Payer: Self-pay

## 2023-02-15 ENCOUNTER — Ambulatory Visit (HOSPITAL_COMMUNITY): Admission: RE | Admit: 2023-02-15 | Payer: 59 | Source: Ambulatory Visit

## 2023-03-04 ENCOUNTER — Other Ambulatory Visit: Payer: Self-pay | Admitting: Internal Medicine

## 2023-03-25 ENCOUNTER — Other Ambulatory Visit: Payer: Self-pay

## 2023-03-25 ENCOUNTER — Encounter: Payer: Self-pay | Admitting: Family

## 2023-03-25 ENCOUNTER — Ambulatory Visit (INDEPENDENT_AMBULATORY_CARE_PROVIDER_SITE_OTHER): Payer: 59 | Admitting: Family

## 2023-03-25 VITALS — BP 114/75 | HR 87 | Temp 98.4°F | Resp 16 | Wt 129.0 lb

## 2023-03-25 DIAGNOSIS — A539 Syphilis, unspecified: Secondary | ICD-10-CM | POA: Diagnosis not present

## 2023-03-25 DIAGNOSIS — J439 Emphysema, unspecified: Secondary | ICD-10-CM | POA: Diagnosis not present

## 2023-03-25 DIAGNOSIS — Z21 Asymptomatic human immunodeficiency virus [HIV] infection status: Secondary | ICD-10-CM

## 2023-03-25 DIAGNOSIS — Z9189 Other specified personal risk factors, not elsewhere classified: Secondary | ICD-10-CM

## 2023-03-25 DIAGNOSIS — Z0000 Encounter for general adult medical examination without abnormal findings: Secondary | ICD-10-CM | POA: Diagnosis not present

## 2023-03-25 MED ORDER — BIKTARVY 50-200-25 MG PO TABS: 1.0000 | ORAL_TABLET | Freq: Every day | ORAL | 6 refills | Status: DC

## 2023-03-25 NOTE — Assessment & Plan Note (Signed)
Discussed importance of safe sexual practice and condom use. Condoms and STD testing offered.  Referral placed to GI for colonoscopy per request.

## 2023-03-25 NOTE — Assessment & Plan Note (Signed)
Mr. Tomaso continues to have well controlled virus with good adherence and tolerance to Biktarvy and Prezcobix. Reviewed lab work and U=U. Will discontinue Prezcobix for now and see how he does on Biktarvy by itself. Based on previous Genotypes he should be fine. Check lab work today and then again in 1 month to ensure maintained viral suppression. Plan for follow up in 4 months or sooner if needed.

## 2023-03-25 NOTE — Progress Notes (Signed)
Brief Narrative   Patient ID: Quent Lingenfelter, male    DOB: 04-23-1981, 42 y.o.   MRN: [id number]  Mr. Mcveigh is a 42 y/o AA male diagnosed with HIV disease in March 2015 with risk factor of MSM. Initial CD4 count of 20 and viral load 366,287. Genosure with K103N resistance (efavirenz and nevirapine). [id number] negative. Entered care at [facility] Stage 3. ART experienced with Tivicay, Descovy, Truvada, Raltegravir, and now [place].   Subjective:    Chief Complaint  Patient presents with   Follow-up    B20     HPI:  Arren Theopolis Amesquita is a 42 y.o. male with HIV disease last seen on 11/24/22 with well controlled virus and good adherence and tolerance to Biktarvy and Prezcobix. Viral load was undetectable and CD4 count 1,182. Kidney function, liver function and electrolytes within normal ranges. RPR stable at 1:16 with previous treatment at 1:256. Continued on Prezcobix and Biktarvy. Here today for follow up.   Gilad has been doing okay since his last office visit and lost his job in December and is currently working for [employer]. Continues to take Biktarvy and Prezcobix with occasional missed dose. Having continued issues with congestion that have remained unchanged. Started on medication by Pulmonology but never really took them as prescribed. Requesting referral to GI for colonoscopy as he has had issues with hemorrhoids in the past and sees bright red blood on the toilet paper. Condoms and STD testing offered.   Denies fevers, chills, night sweats, headaches, changes in vision, neck pain/stiffness, nausea, diarrhea, vomiting, lesions or rashes.  No Known Allergies    Outpatient Medications Prior to Visit  Medication Sig Dispense Refill   albuterol (VENTOLIN HFA) 108 (90 Base) MCG/ACT inhaler Inhale 2 puffs into the lungs every 6 (six) hours as needed for wheezing or shortness of breath. 8 g 2   aspirin EC 81 MG tablet Take 81 mg by mouth daily. Swallow whole.      cetirizine (ZYRTEC) 10 MG tablet TAKE 1 TABLET(10 MG) BY MOUTH DAILY 30 tablet 0   flunisolide (NASALIDE) 25 MCG/ACT (0.025%) SOLN Place 2 sprays into the nose daily as needed (allergies).     Fluticasone-Umeclidin-Vilant (TRELEGY ELLIPTA) 100-62.5-25 MCG/ACT AEPB Inhale 1 puff into the lungs daily. 1 each 0   metoprolol succinate (TOPROL-XL) 25 MG 24 hr tablet Take 1 tablet (25 mg total) by mouth daily. Please make overdue appt with Dr. Acie Fredrickson before anymore refills. Thank you 2nd attempt 15 tablet 0   nitroGLYCERIN (NITROSTAT) 0.4 MG SL tablet Place 1 tablet (0.4 mg total) under the tongue every 5 (five) minutes x 3 doses as needed for chest pain. 25 tablet 0   prasugrel (EFFIENT) 10 MG TABS tablet Take 10 mg by mouth daily.     bictegravir-emtricitabine-tenofovir AF (BIKTARVY) 50-200-25 MG TABS tablet Take 1 tablet by mouth daily. 30 tablet 11   darunavir-cobicistat (PREZCOBIX) 800-150 MG tablet Swallow whole. Do NOT crush, break or chew tablets. Take with food. 30 tablet 5   cimetidine (TAGAMET) 400 MG tablet Take 1 tablet (400 mg total) by mouth 2 (two) times daily for 15 days. 30 tablet 0   EPINEPHrine 0.3 mg/0.3 mL IJ SOAJ injection Inject 0.3 mg into the muscle as needed for anaphylaxis. (Patient not taking: Reported on 03/25/2023)     No facility-administered medications prior to visit.     Past Medical History:  Diagnosis Date   Anemia    COPD (chronic obstructive pulmonary disease) ([place])  Coronary artery disease    MI 2010 - dissection/plaque rupture in LAD tx with antiplt/antithrombotics >> relook cath with resolution (no PCI performed) // Inf STEMI 3/19: OM1 30; pRCA 100 >> DES // Echo 3/19:  mild LVH, EF 40-45, inf/inf-sept HK, trivial MR   Eczema    GERD (gastroesophageal reflux disease)    History of blood clots    History of myocardial infarction 2010; 2019   History of pleural empyema    HIV infection ([place])    Hyperlipidemia    Hypertension    Immune deficiency  disorder ([place])    Ischemic cardiomyopathy 04/05/2018   Moderate persistent asthma without complication [id number]   Myocardial infarction [facility])    2009, 2019   Pneumonia      Past Surgical History:  Procedure Laterality Date   CARDIAC CATHETERIZATION  06/24/2009   EF 60%   CARDIAC CATHETERIZATION  06/17/2009   EF 45%. ANTERIOR HYPOKINESIS   CORONARY STENT INTERVENTION N/A 03/24/2018   Procedure: CORONARY STENT INTERVENTION;  Surgeon: Sherren Mocha, MD;  Location: [place] CV LAB;  Service: Cardiovascular;  Laterality: N/A;   CORONARY/GRAFT ACUTE MI REVASCULARIZATION N/A 03/24/2018   Procedure: Coronary/Graft Acute MI Revascularization;  Surgeon: Sherren Mocha, MD;  Location: [place] CV LAB;  Service: Cardiovascular;  Laterality: N/A;   LEFT HEART CATH AND CORONARY ANGIOGRAPHY N/A 03/24/2018   Procedure: LEFT HEART CATH AND CORONARY ANGIOGRAPHY;  Surgeon: Sherren Mocha, MD;  Location: [place] CV LAB;  Service: Cardiovascular;  Laterality: N/A;   SINUS IRRIGATION  10/08/2022   Procedure: BRONCHIAL LAVAGE;  Surgeon: Juanito Doom, MD;  Location: [place];  Service: Cardiopulmonary;;   TOTAL HIP ARTHROPLASTY Right 09/25/2019   Procedure: RIGHT TOTAL HIP ARTHROPLASTY ANTERIOR APPROACH;  Surgeon: Leandrew Koyanagi, MD;  Location: [place];  Service: Orthopedics;  Laterality: Right;   US ECHOCARDIOGRAPHY  12/09/2009   EF 55-60%   VIDEO ASSISTED THORACOSCOPY (VATS)/ LOBECTOMY Right 04/23/2015   Procedure: VIDEO ASSISTED THORACOSCOPY (VATS)/RIGHT upper  and right middle LOBECTOMY;  Surgeon: Ivin Poot, MD;  Location: [place];  Service: Thoracic;  Laterality: Right;   VIDEO BRONCHOSCOPY Bilateral 09/05/2014   Procedure: VIDEO BRONCHOSCOPY WITH FLUORO;  Surgeon: Wilhelmina Mcardle, MD;  Location[facility] ENDOSCOPY;  Service: Cardiopulmonary;  Laterality: Bilateral;   VIDEO BRONCHOSCOPY Bilateral 04/10/2015   Procedure: VIDEO BRONCHOSCOPY WITH FLUORO;  Surgeon: Juanito Doom, MD;   Location: [facility] ENDOSCOPY;  Service: Cardiopulmonary;  Laterality: Bilateral;   VIDEO BRONCHOSCOPY N/A 10/08/2022   Procedure: VIDEO BRONCHOSCOPY WITHOUT FLUORO;  Surgeon: Juanito Doom, MD;  Location: [place];  Service: Cardiopulmonary;  Laterality: N/A;      Review of Systems  Constitutional:  Negative for appetite change, chills, fatigue, fever and unexpected weight change.  Eyes:  Negative for visual disturbance.  Respiratory:  Negative for cough, chest tightness, shortness of breath and wheezing.   Cardiovascular:  Negative for chest pain and leg swelling.  Gastrointestinal:  Negative for abdominal pain, constipation, diarrhea, nausea and vomiting.  Genitourinary:  Negative for dysuria, flank pain, frequency, genital sores, hematuria and urgency.  Skin:  Negative for rash.  Allergic/Immunologic: Negative for immunocompromised state.  Neurological:  Negative for dizziness and headaches.      Objective:    BP 114/75   Pulse 87   Temp 98.4 F (36.9 C) (Oral)   Resp 16   Wt 129 lb (58.5 kg)   SpO2 95%   BMI 22.14 kg/m  Nursing note and vital signs reviewed.  Physical Exam Constitutional:      General: He is not in acute distress.    Appearance: He is well-developed.  Eyes:     Conjunctiva/sclera: Conjunctivae normal.  Cardiovascular:     Rate and Rhythm: Normal rate and regular rhythm.     Heart sounds: Normal heart sounds. No murmur heard.    No friction rub. No gallop.  Pulmonary:     Effort: Pulmonary effort is normal. No respiratory distress.     Breath sounds: Normal breath sounds. No wheezing or rales.  Chest:     Chest wall: No tenderness.  Abdominal:     General: Bowel sounds are normal.     Palpations: Abdomen is soft.     Tenderness: There is no abdominal tenderness.  Musculoskeletal:     Cervical back: Neck supple.  Lymphadenopathy:     Cervical: No cervical adenopathy.  Skin:    General: Skin is warm and dry.     Findings: No rash.   Neurological:     Mental Status: He is alert and oriented to person, place, and time.  Psychiatric:        Behavior: Behavior normal.        Thought Content: Thought content normal.        Judgment: Judgment normal.         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   Hemorrhoid 09/30/2020   Avascular necrosis of bone of right hip ([place]) 09/25/2019   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   Hyperlipidemia 04/05/2018   Ischemic cardiomyopathy 04/05/2018   History of acute inferior wall MI 03/24/2018   Marijuana abuse, continuous 10/29/2016   Anemia 05/03/2015   Pulmonary emphysema ([place])    HIV (human immunodeficiency virus infection) ([place])    Tobacco abuse 03/21/2015   Pulmonary nodule [id number]   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 Boik continues to have congestion which I suspect is related to his pulmonary emphysema and not adequately controlled as he has not been taking medications. Recommend follow up with Pulmonology to discuss plan of care as there is no convincing evidence that there is infection present. No additional treatment is needed from ID stand point. He will try the Trelegy to see if it helps.         Other   HIV (human immunodeficiency virus infection) ([place]) - Primary    Mr. Curet continues to have well controlled virus with good adherence and  tolerance to Biktarvy and Prezcobix. Reviewed lab work and U=U. Will discontinue Prezcobix for now and see how he does on Biktarvy by itself. Based on previous Genotypes he should be fine. Check lab work today and then again in 1 month to  ensure maintained viral suppression. Plan for follow up in 4 months or sooner if needed.       Relevant Medications   bictegravir-emtricitabine-tenofovir AF (BIKTARVY) 50-200-25 MG TABS tablet   Other Relevant Orders   COMPLETE METABOLIC PANEL WITH GFR   T-helper cells (CD4) count (not at [facility])   HIV-1 RNA quant-no reflex-bld   Healthcare maintenance    Discussed importance of safe sexual practice and condom use. Condoms and STD testing offered.  Referral placed to GI for colonoscopy per request.       Syphilis    Previous titer stable at 1:16 with treatment at 1:256. Educated about RPR. Check RPR and continue to monitor.       Relevant Medications   bictegravir-emtricitabine-tenofovir AF (BIKTARVY) 50-200-25 MG TABS tablet   Other Relevant Orders   RPR   Other Visit Diagnoses     At risk for colon cancer       Relevant Orders   Ambulatory referral to Gastroenterology        I have discontinued Karsen E. Tyler's Prezcobix. I am also having him maintain his nitroGLYCERIN, metoprolol succinate, flunisolide, aspirin EC, prasugrel, EPINEPHrine, albuterol, Trelegy Ellipta, cimetidine, cetirizine, and Biktarvy.   Meds ordered this encounter  Medications   bictegravir-emtricitabine-tenofovir AF (BIKTARVY) 50-200-25 MG TABS tablet    Sig: Take 1 tablet by mouth daily.    Dispense:  30 tablet    Refill:  6    Discontinue Prezcobix    Order Specific Question:   Supervising Provider    Answer:   Carlyle Basques [[id number]]     Follow-up: Return in about 4 months (around 07/25/2023), or if symptoms worsen or fail to improve.   Terri Piedra, MSN, FNP-C Nurse Practitioner [facility] [place] number: [phone]

## 2023-03-25 NOTE — Assessment & Plan Note (Signed)
Previous titer stable at 1:16 with treatment at 1:256. Educated about RPR. Check RPR and continue to monitor.

## 2023-03-25 NOTE — Patient Instructions (Signed)
Nice to see you.  We will check your lab work today.  Continue to take your medication daily as prescribed.  Refills have been sent to the pharmacy.  Plan for follow up in 4 months or sooner if needed with lab work on the same day.  Have a great day and stay safe!  

## 2023-03-25 NOTE — Assessment & Plan Note (Signed)
Rodney Walker continues to have congestion which I suspect is related to his pulmonary emphysema and not adequately controlled as he has not been taking medications. Recommend follow up with Pulmonology to discuss plan of care as there is no convincing evidence that there is infection present. No additional treatment is needed from ID stand point. He will try the Trelegy to see if it helps.

## 2023-03-27 LAB — COMPLETE METABOLIC PANEL WITH GFR
AG Ratio: 1.4 (calc) (ref 1.0–2.5)
ALT: 11 U/L (ref 9–46)
AST: 14 U/L (ref 10–40)
Albumin: 4.2 g/dL (ref 3.6–5.1)
Alkaline phosphatase (APISO): 86 U/L (ref 36–130)
BUN: 11 mg/dL (ref 7–25)
CO2: 27 mmol/L (ref 20–32)
Calcium: 9.3 mg/dL (ref 8.6–10.3)
Chloride: 104 mmol/L (ref 98–110)
Creat: 1.08 mg/dL (ref 0.60–1.29)
Globulin: 2.9 g/dL (calc) (ref 1.9–3.7)
Glucose, Bld: 106 mg/dL — ABNORMAL HIGH (ref 65–99)
Potassium: 3.9 mmol/L (ref 3.5–5.3)
Sodium: 138 mmol/L (ref 135–146)
Total Bilirubin: 0.6 mg/dL (ref 0.2–1.2)
Total Protein: 7.1 g/dL (ref 6.1–8.1)
eGFR: 88 mL/min/{1.73_m2} (ref >=60)

## 2023-03-27 LAB — RPR TITER: RPR Titer: 1:8 {titer} — ABNORMAL HIGH

## 2023-03-27 LAB — HIV-1 RNA QUANT-NO REFLEX-BLD
HIV 1 RNA Quant: 39 Copies/mL — ABNORMAL HIGH
HIV-1 RNA Quant, Log: 1.59 Log cps/mL — ABNORMAL HIGH

## 2023-03-27 LAB — RPR: RPR Ser Ql: REACTIVE — AB

## 2023-03-27 LAB — T-HELPER CELLS (CD4) COUNT (NOT AT ARMC)
Absolute CD4: 1216 cells/uL (ref 490–1740)
CD4 T Helper %: 28 % — ABNORMAL LOW (ref 30–61)
Total lymphocyte count: 4328 cells/uL — ABNORMAL HIGH (ref 850–3900)

## 2023-03-27 LAB — T PALLIDUM AB: T Pallidum Abs: POSITIVE — AB

## 2023-04-03 ENCOUNTER — Other Ambulatory Visit: Payer: Self-pay | Admitting: Family

## 2023-04-23 ENCOUNTER — Other Ambulatory Visit: Payer: Self-pay

## 2023-04-23 DIAGNOSIS — B20 Human immunodeficiency virus [HIV] disease: Secondary | ICD-10-CM

## 2023-04-23 DIAGNOSIS — Z113 Encounter for screening for infections with a predominantly sexual mode of transmission: Secondary | ICD-10-CM

## 2023-04-26 ENCOUNTER — Other Ambulatory Visit: Payer: Self-pay

## 2023-04-26 ENCOUNTER — Other Ambulatory Visit (HOSPITAL_COMMUNITY)
Admission: RE | Admit: 2023-04-26 | Discharge: 2023-04-26 | Disposition: A | Discharge status: Home / Self Care | Payer: 59 | Source: Ambulatory Visit | Attending: Family | Admitting: Family

## 2023-04-26 ENCOUNTER — Other Ambulatory Visit: Payer: 59

## 2023-04-26 DIAGNOSIS — Z113 Encounter for screening for infections with a predominantly sexual mode of transmission: Secondary | ICD-10-CM | POA: Insufficient documentation

## 2023-04-26 DIAGNOSIS — B20 Human immunodeficiency virus [HIV] disease: Secondary | ICD-10-CM | POA: Insufficient documentation

## 2023-04-27 LAB — URINE CYTOLOGY ANCILLARY ONLY
Chlamydia: NEGATIVE
Comment: NEGATIVE
Comment: NORMAL
Neisseria Gonorrhea: NEGATIVE

## 2023-04-27 LAB — T-HELPER CELL (CD4) - (RCID CLINIC ONLY)
CD4 % Helper T Cell: 25 % — ABNORMAL LOW (ref 33–65)
CD4 T Cell Abs: 1098 /uL (ref 400–1790)

## 2023-04-29 LAB — COMPLETE METABOLIC PANEL WITH GFR
AG Ratio: 1.9 (calc) (ref 1.0–2.5)
ALT: 17 U/L (ref 9–46)
AST: 16 U/L (ref 10–40)
Albumin: 4.3 g/dL (ref 3.6–5.1)
Alkaline phosphatase (APISO): 86 U/L (ref 36–130)
BUN: 14 mg/dL (ref 7–25)
CO2: 29 mmol/L (ref 20–32)
Calcium: 9.4 mg/dL (ref 8.6–10.3)
Chloride: 102 mmol/L (ref 98–110)
Creat: 1.09 mg/dL (ref 0.60–1.29)
Globulin: 2.3 g/dL (calc) (ref 1.9–3.7)
Glucose, Bld: 88 mg/dL (ref 65–99)
Potassium: 4 mmol/L (ref 3.5–5.3)
Sodium: 137 mmol/L (ref 135–146)
Total Bilirubin: 0.4 mg/dL (ref 0.2–1.2)
Total Protein: 6.6 g/dL (ref 6.1–8.1)
eGFR: 87 mL/min/{1.73_m2} (ref >=60)

## 2023-04-29 LAB — HIV-1 RNA QUANT-NO REFLEX-BLD
HIV 1 RNA Quant: 32 Copies/mL — ABNORMAL HIGH
HIV-1 RNA Quant, Log: 1.5 Log cps/mL — ABNORMAL HIGH

## 2023-04-29 LAB — CBC WITH DIFFERENTIAL/PLATELET
Absolute Monocytes: 736 cells/uL (ref 200–950)
Basophils Absolute: 37 cells/uL (ref 0–200)
Basophils Relative: 0.4 %
Eosinophils Absolute: 64 cells/uL (ref 15–500)
Eosinophils Relative: 0.7 %
HCT: 42.8 % (ref 38.5–50.0)
Hemoglobin: 15.5 g/dL (ref 13.2–17.1)
Lymphs Abs: 4692 cells/uL — ABNORMAL HIGH (ref 850–3900)
MCH: 34.4 pg — ABNORMAL HIGH (ref 27.0–33.0)
MCHC: 36.2 g/dL — ABNORMAL HIGH (ref 32.0–36.0)
MCV: 94.9 fL (ref 80.0–100.0)
MPV: 9.6 fL (ref 7.5–12.5)
Monocytes Relative: 8 %
Neutro Abs: 3671 cells/uL (ref 1500–7800)
Neutrophils Relative %: 39.9 %
Platelets: 229 10*3/uL (ref 140–400)
RBC: 4.51 10*6/uL (ref 4.20–5.80)
RDW: 14 % (ref 11.0–15.0)
Total Lymphocyte: 51 %
WBC: 9.2 10*3/uL (ref 3.8–10.8)

## 2023-04-29 LAB — RPR: RPR Ser Ql: REACTIVE — AB

## 2023-04-29 LAB — T PALLIDUM AB: T Pallidum Abs: POSITIVE — AB

## 2023-04-29 LAB — RPR TITER: RPR Titer: 1:8 {titer} — ABNORMAL HIGH

## 2023-05-14 ENCOUNTER — Ambulatory Visit (INDEPENDENT_AMBULATORY_CARE_PROVIDER_SITE_OTHER): Payer: 59 | Admitting: Nurse Practitioner

## 2023-05-14 ENCOUNTER — Telehealth: Payer: Self-pay

## 2023-05-14 ENCOUNTER — Encounter: Payer: Self-pay | Admitting: Nurse Practitioner

## 2023-05-14 DIAGNOSIS — Z7901 Long term (current) use of anticoagulants: Secondary | ICD-10-CM

## 2023-05-14 DIAGNOSIS — K625 Hemorrhage of anus and rectum: Secondary | ICD-10-CM

## 2023-05-14 MED ORDER — NA SULFATE-K SULFATE-MG SULF 17.5-3.13-1.6 GM/177ML PO SOLN: 1.0000 | ORAL | 0 refills | Status: DC

## 2023-05-14 NOTE — Patient Instructions (Signed)
_______________________________________________________  If your blood pressure at your visit was 140/90 or greater, please contact your primary care physician to follow up on this.  If you are age 44 or younger, your body mass index should be between 19-25. Your Body mass index is 22.3 kg/m. If this is out of the aformentioned range listed, please consider follow up with your Primary Care Provider.  ________________________________________________________  The [facility] providers would like to encourage you to use [facility] to communicate with providers for non-urgent requests or questions.  Due to long hold times on the telephone, sending your provider a message by [facility] may be a faster and more efficient way to get a response.  Please allow 48 business hours for a response.  Please remember that this is for non-urgent requests.  _______________________________________________________  Bonita Quin have been scheduled for a colonoscopy. Please follow written instructions given to you at your visit today.  Please pick up your prep supplies at the pharmacy within the next 1-3 days. If you use inhalers (even only as needed), please bring them with you on the day of your procedure.  Due to recent changes in healthcare laws, you may see the results of your imaging and laboratory studies on MyChart before your provider has had a chance to review them.  We understand that in some cases there may be results that are confusing or concerning to you. Not all laboratory results come back in the same time frame and the provider may be waiting for multiple results in order to interpret others.  Please give [place] 48 hours in order for your provider to thoroughly review all the results before contacting the office for clarification of your results.   Thank you for entrusting me with your care and choosing [facility].  Willette Cluster, NP

## 2023-05-14 NOTE — Telephone Encounter (Signed)
Spoke with patient who is agreeable to see Tereso Newcomer, PA-C on 6/17 at 1:55 pm. I will route to requesting provider's office.

## 2023-05-14 NOTE — Progress Notes (Signed)
Assessment and Plan   Primary GI: Erick Blinks, MD  Brief Narrative:  42 y.o. yo male whose past medical history includes but is not necessarily limited to HIV, COPD, CAD / MI s/p stenting on Effient, HTN, THC use  Chronic, intermittent painless rectal bleeding with bowel movements. Suspect 2/2 to hemorrhoids. Could also be due in part to perirectal irritation related to sexual activity -Schedule for a diagnostic colonoscopy. The risks and benefits of colonoscopy with possible polypectomy / biopsies were discussed and the patient agrees to proceed.    HIV, on meds. Followed by ID.   CAD / MI x2 , s/p stent placement ~ 4 years ago. On Effient  -Hold Effient for 7days before procedure - will instruct when and how to resume after procedure. Patient understands that there is a low but real risk of cardiovascular event such as heart attack, stroke, or embolism /  thrombosis while off blood thinner. The patient consents to proceed. Will communicate by phone or EMR with patient's prescribing provider to confirm that holding Effient is reasonable in this case.    History of Present Illness   Chief complaint:  follow up on rectal bleeding  Rodney Walker was seen in December 2021 for evaluation of rectal bleeding on Effient.  Colonoscopy was recommended but not done because he didn't have a care partner to accompany him to the procedure.  He is back to discuss having a colonoscopy. He has a friend to serve as a care partner. Rodney Walker generally has about 3 formed BMs a day. He doesn't strain and stools are not hard. However, he continues to have intermittent painless rectal bleeding with bowel movements a few times a month. He sometimes bleeds after anal intercourse but bleeds other times as well. He otherwise feels well. Appetite is good. Weight is stable.   Former smoker, has emphysema. Stopped tobacco years ago. Smoke THC daily   04/26/2023 Hemoglobin 15.5       Latest Ref Rng & Units 04/26/2023     3:39 PM 03/25/2023    4:47 PM 11/09/2022    4:07 PM  Hepatic Function  Total Protein 6.1 - 8.1 g/dL 6.6  7.1  7.2   AST 10 - 40 U/L 16  14  16    ALT 9 - 46 U/L 17  11  14    Total Bilirubin 0.2 - 1.2 mg/dL 0.4  0.6  0.4        Latest Ref Rng & Units 04/26/2023    3:39 PM 11/09/2022    4:07 PM 10/08/2022   10:40 AM  CBC  WBC 3.8 - 10.8 Thousand/uL 9.2  8.8  7.8   Hemoglobin 13.2 - 17.1 g/dL 54.0  98.1  19.1   Hematocrit 38.5 - 50.0 % 42.8  42.1  RESULTS UNAVAILABLE DUE TO INTERFERING SUBSTANCE   Platelets 140 - 400 Thousand/uL 229  238  217      Past Medical History:  Diagnosis Date   Anemia    COPD (chronic obstructive pulmonary disease) (HCC)    Coronary artery disease    MI 2010 - dissection/plaque rupture in LAD tx with antiplt/antithrombotics >> relook cath with resolution (no PCI performed) // Inf STEMI 3/19: OM1 30; pRCA 100 >> DES // Echo 3/19:  mild LVH, EF 40-45, inf/inf-sept HK, trivial MR   Eczema    GERD (gastroesophageal reflux disease)    History of blood clots    History of myocardial infarction 2010; 2019   History of pleural empyema  HIV infection (HCC)    Hyperlipidemia    Hypertension    Immune deficiency disorder (HCC)    Ischemic cardiomyopathy 04/05/2018   Moderate persistent asthma without complication 10/29/2016   Myocardial infarction [facility])    2009, 2019   Pneumonia     Past Surgical History:  Procedure Laterality Date   CARDIAC CATHETERIZATION  06/24/2009   EF 60%   CARDIAC CATHETERIZATION  06/17/2009   EF 45%. ANTERIOR HYPOKINESIS   CORONARY STENT INTERVENTION N/A 03/24/2018   Procedure: CORONARY STENT INTERVENTION;  Surgeon: Tonny Bollman, MD;  Location: [facility] INVASIVE CV LAB;  Service: Cardiovascular;  Laterality: N/A;   CORONARY/GRAFT ACUTE MI REVASCULARIZATION N/A 03/24/2018   Procedure: Coronary/Graft Acute MI Revascularization;  Surgeon: Tonny Bollman, MD;  Location: [facility] INVASIVE CV LAB;  Service: Cardiovascular;  Laterality: N/A;    LEFT HEART CATH AND CORONARY ANGIOGRAPHY N/A 03/24/2018   Procedure: LEFT HEART CATH AND CORONARY ANGIOGRAPHY;  Surgeon: Tonny Bollman, MD;  Location: [facility] INVASIVE CV LAB;  Service: Cardiovascular;  Laterality: N/A;   SINUS IRRIGATION  10/08/2022   Procedure: BRONCHIAL LAVAGE;  Surgeon: Lupita Leash, MD;  Location: [facility] ENDOSCOPY;  Service: Cardiopulmonary;;   TOTAL HIP ARTHROPLASTY Right 09/25/2019   Procedure: RIGHT TOTAL HIP ARTHROPLASTY ANTERIOR APPROACH;  Surgeon: Tarry Kos, MD;  Location: [facility] OR;  Service: Orthopedics;  Laterality: Right;   US ECHOCARDIOGRAPHY  12/09/2009   EF 55-60%   VIDEO ASSISTED THORACOSCOPY (VATS)/ LOBECTOMY Right 04/23/2015   Procedure: VIDEO ASSISTED THORACOSCOPY (VATS)/RIGHT upper  and right middle LOBECTOMY;  Surgeon: Kerin Perna, MD;  Location: [facility] OR;  Service: Thoracic;  Laterality: Right;   VIDEO BRONCHOSCOPY Bilateral 09/05/2014   Procedure: VIDEO BRONCHOSCOPY WITH FLUORO;  Surgeon: Merwyn Katos, MD;  Location: [facility] ENDOSCOPY;  Service: Cardiopulmonary;  Laterality: Bilateral;   VIDEO BRONCHOSCOPY Bilateral 04/10/2015   Procedure: VIDEO BRONCHOSCOPY WITH FLUORO;  Surgeon: Lupita Leash, MD;  Location: [facility] ENDOSCOPY;  Service: Cardiopulmonary;  Laterality: Bilateral;   VIDEO BRONCHOSCOPY N/A 10/08/2022   Procedure: VIDEO BRONCHOSCOPY WITHOUT FLUORO;  Surgeon: Lupita Leash, MD;  Location: [facility] ENDOSCOPY;  Service: Cardiopulmonary;  Laterality: N/A;    Current Medications, Allergies, Family History and Social History were reviewed in [employer] record.     Current Outpatient Medications  Medication Sig Dispense Refill   albuterol (VENTOLIN HFA) 108 (90 Base) MCG/ACT inhaler Inhale 2 puffs into the lungs every 6 (six) hours as needed for wheezing or shortness of breath. 8 g 2   aspirin EC 81 MG tablet Take 81 mg by mouth daily. Swallow whole.     bictegravir-emtricitabine-tenofovir AF (BIKTARVY) 50-200-25 MG TABS  tablet Take 1 tablet by mouth daily. 30 tablet 6   cetirizine (ZYRTEC) 10 MG tablet TAKE 1 TABLET(10 MG) BY MOUTH DAILY 30 tablet 0   cimetidine (TAGAMET) 400 MG tablet Take 1 tablet (400 mg total) by mouth 2 (two) times daily for 15 days. 30 tablet 0   EPINEPHrine 0.3 mg/0.3 mL IJ SOAJ injection Inject 0.3 mg into the muscle as needed for anaphylaxis. (Patient not taking: Reported on 03/25/2023)     flunisolide (NASALIDE) 25 MCG/ACT (0.025%) SOLN Place 2 sprays into the nose daily as needed (allergies).     Fluticasone-Umeclidin-Vilant (TRELEGY ELLIPTA) 100-62.5-25 MCG/ACT AEPB Inhale 1 puff into the lungs daily. 1 each 0   metoprolol succinate (TOPROL-XL) 25 MG 24 hr tablet Take 1 tablet (25 mg total) by mouth daily. Please make overdue appt with Dr. Elease Hashimoto before  anymore refills. Thank you 2nd attempt 15 tablet 0   nitroGLYCERIN (NITROSTAT) 0.4 MG SL tablet Place 1 tablet (0.4 mg total) under the tongue every 5 (five) minutes x 3 doses as needed for chest pain. 25 tablet 0   prasugrel (EFFIENT) 10 MG TABS tablet Take 10 mg by mouth daily.     No current facility-administered medications for this visit.    Review of Systems: No chest pain. No shortness of breath. No urinary complaints.    Physical Exam  Wt Readings from Last 3 Encounters:  03/25/23 129 lb (58.5 kg)  12/17/22 133 lb (60.3 kg)  11/24/22 131 lb 9.6 oz (59.7 kg)    BP 118/68   Pulse 80   Ht 5\' 5"  (1.651 m) Comment: with shoe on  Wt 134 lb (60.8 kg)   BMI 22.30 kg/m  Constitutional:  Pleasant, generally well appearing male in no acute distress. Psychiatric: Normal mood and affect. Behavior is normal. EENT: Pupils normal.  Conjunctivae are normal. No scleral icterus. Neck supple.  Cardiovascular: Normal rate, regular rhythm.  Pulmonary/chest: Effort normal and breath sounds normal. No wheezing, rales or rhonchi. Abdominal: Soft, nondistended, nontender. Bowel sounds active throughout. There are no masses palpable. No  hepatomegaly. Neurological: Alert and oriented to person place and time.  Skin: Skin is warm and dry. No rashes noted.  Willette Cluster, NP  05/14/2023, 8:17 AM  Cc:  Georgina Quint, *

## 2023-05-14 NOTE — Telephone Encounter (Signed)
[facility] Pre-operative Risk Assessment     Request for surgical clearance:     Endoscopy Procedure  What type of surgery is being performed?     Colonoscopy  When is this surgery scheduled?     06-29-23  What type of clearance is required ?   Pharmacy  Are there any medications that need to be held prior to surgery and how long? Effient x 7 days  Practice name and name of physician performing surgery?      [facility]  What is your office phone and fax number?      Phone- [phone]  Fax- [phone]  Anesthesia type (None, local, MAC, general) ?       MAC

## 2023-05-14 NOTE — Telephone Encounter (Signed)
Primary Cardiologist:Philip [name], MD   Preoperative team, please contact this patient and set up a phone call appointment for further preoperative risk assessment. Please obtain consent and complete medication review. Thank you for your help.   Request to hold antiplatelet therapy to be addressed at time of visit.  Levi Aland, NP-C  05/14/2023, 2:12 PM [address], [address] Office [phone] Fax [phone]

## 2023-05-17 NOTE — Progress Notes (Signed)
Addendum: Reviewed and agree with assessment and management plan. Kenley Rettinger M, MD  

## 2023-06-09 NOTE — Progress Notes (Signed)
The ASCVD Risk score (Arnett DK, et al., 2019) failed to calculate for the following reasons:   The patient has a prior MI or stroke diagnosis  Sandie Ano, RN

## 2023-06-14 ENCOUNTER — Encounter: Payer: Self-pay | Admitting: Physician Assistant

## 2023-06-14 ENCOUNTER — Ambulatory Visit: Payer: 59 | Attending: Physician Assistant | Admitting: Physician Assistant

## 2023-06-14 VITALS — BP 102/68 | HR 78 | Ht 65.0 in | Wt 130.6 lb

## 2023-06-14 DIAGNOSIS — E7800 Pure hypercholesterolemia, unspecified: Secondary | ICD-10-CM | POA: Diagnosis not present

## 2023-06-14 DIAGNOSIS — I25118 Atherosclerotic heart disease of native coronary artery with other forms of angina pectoris: Secondary | ICD-10-CM | POA: Diagnosis not present

## 2023-06-14 DIAGNOSIS — I255 Ischemic cardiomyopathy: Secondary | ICD-10-CM

## 2023-06-14 DIAGNOSIS — Z01810 Encounter for preprocedural cardiovascular examination: Secondary | ICD-10-CM | POA: Insufficient documentation

## 2023-06-14 MED ORDER — ROSUVASTATIN CALCIUM 20 MG PO TABS: 20.0000 mg | ORAL_TABLET | Freq: Every day | ORAL | 3 refills | Status: DC

## 2023-06-14 NOTE — Assessment & Plan Note (Signed)
History of anterior MI in 2010 with heavy thrombus burden, treated medically with antiplatelet and antithrombotic therapy.  Status post inferior STEMI in 2019 treated DES to the RCA.  He has been maintained on long-term dual antiplatelet therapy.  He is not having chest discomfort to suggest angina.  Continue ASA 81 mg daily, Effient 10 mg daily, Toprol-XL 25 mg daily, Crestor.  Follow-up in 6 months.

## 2023-06-14 NOTE — Progress Notes (Signed)
Cardiology Office Note:    Date:  06/14/2023  ID:  Rodney Walker, DOB 23-Aug-1981, MRN [id number] PCP: Georgina Quint, MD  [facility] Providers Cardiologist:  Kristeen Miss, MD       Patient Profile:      Coronary artery disease Anterior MI in 2010 - Med Rx (antithrombotic, antiplatelet Rx) Inf STEMI 02/2018 s/p 4 x 23 mm DES to [facility] 03/24/2018: LM irregularities, LAD irregularities, LCx irregularities, OM1 30, proximal RCA 100 thrombotic (PCI), EF >65 Long term DAPT Ischemic CM TTE 03/25/18: Mild LVH, EF 40-45, inferior/inferoseptal HK, trivial MR  HIV Asthma Chronic Obstructive Pulmonary Disease  Hyperlipidemia Hypertension Aortic atherosclerosis Empyema 03/2015 s/p right VATS, right upper and right middle lobectomy      History of Present Illness:   Rodney Walker is a 42 y.o. male who returns for surgical clearance.  He is last seen by Dr. Elease Hashimoto in August 2023.  He needs a colonoscopy with [facility] 06/29/2023.  Request is to hold Effient for 7 days.  He is here alone.  He has noted shortness of breath with exertion at times.  He started working for [employer].  He started noticing the shortness of breath after this.  However, as he has done this more more, his breathing has gotten better.  He is able to walk up a flight of stairs or walk 2 blocks without stopping.  He has not had orthopnea, leg edema, syncope.  He has not had chest discomfort to suggest angina.  Review of Systems  Cardiovascular:  Negative for claudication.   see the HPI    Studies Reviewed:    EKG: NSR, HR 78, normal axis, no ST-T wave changes, QTc 424, no change since prior tracing   Risk Assessment/Calculations:             Physical Exam:   VS:  BP 102/68   Pulse 78   Ht 5\' 5"  (1.651 m)   Wt 130 lb 9.6 oz (59.2 kg)   SpO2 94%   BMI 21.73 kg/m    Wt Readings from Last 3 Encounters:  06/14/23 130 lb 9.6 oz (59.2 kg)  05/14/23 134 lb (60.8 kg)  03/25/23  129 lb (58.5 kg)    Constitutional:      Appearance: Healthy appearance. Not in distress.  Neck:     Vascular: No carotid bruit. JVD normal.  Pulmonary:     Breath sounds: Normal breath sounds. No wheezing. No rales.  Cardiovascular:     Normal rate. Regular rhythm.     Murmurs: There is no murmur.  Edema:    Peripheral edema absent.  Abdominal:     Palpations: Abdomen is soft.       ASSESSMENT AND PLAN:   Preoperative cardiovascular examination Mr. Obst's perioperative risk of a major cardiac event is 6.6% according to the Revised Cardiac Risk Index (RCRI).  Therefore, he is at high risk for perioperative complications.   His functional capacity is good at 4.31 METs according to the Duke Activity Status Index (DASI). He does not shortness of breath but this is improved with increased activity. Therefore, I do not think that he needs an echocardiogram prior to his colonoscopy.  Recommendations: According to ACC/AHA guidelines, no further cardiovascular testing needed.  The patient may proceed to surgery at acceptable risk.   Antiplatelet and/or Anticoagulation Recommendations: The patient should remain on Aspirin without interruption.   I will review further with Dr. Elease Hashimoto to ensure that Prasugrel (  Effient) can be held for 7 days prior to his surgery and resumed as soon as possible post op.  CAD (coronary artery disease) History of anterior MI in 2010 with heavy thrombus burden, treated medically with antiplatelet and antithrombotic therapy.  Status post inferior STEMI in 2019 treated DES to the RCA.  He has been maintained on long-term dual antiplatelet therapy.  He is not having chest discomfort to suggest angina.  Continue ASA 81 mg daily, Effient 10 mg daily, Toprol-XL 25 mg daily, Crestor.  Follow-up in 6 months.  Ischemic cardiomyopathy He notes shortness of breath with exertion.  This has gotten better the more active he has been.  He does not have any signs of volume excess on  exam.  He has many reasons to be short of breath including coronary artery disease, ischemic cardiomyopathy, COPD.  He also has a history of right upper and right middle lobectomy secondary to empyema in 2016.  He has clubbing on exam.  I will update his echocardiogram to reassess his LV function.  If his EF is lower, we will try to adjust GDMT.  GDMT will be limited by low blood pressure.  Continue Toprol-XL 25 mg daily.  Follow-up 6 months.  Hyperlipidemia Labs reviewed.  LDL in November 2023 was above goal at 80.  Increase Crestor to 20 mg daily.  Repeat CMET, lipids in 3 months.      Dispo:  Return in about 6 months (around 12/14/2023) for Routine Follow Up, w/ Dr. Elease Hashimoto.  Signed, Tereso Newcomer, PA-C

## 2023-06-14 NOTE — Assessment & Plan Note (Addendum)
Mr. Rodney Walker's perioperative risk of a major cardiac event is 6.6% according to the Revised Cardiac Risk Index (RCRI).  Therefore, he is at high risk for perioperative complications.   His functional capacity is good at 4.31 METs according to the Duke Activity Status Index (DASI). He does not shortness of breath but this is improved with increased activity. Therefore, I do not think that he needs an echocardiogram prior to his colonoscopy.  Recommendations: According to ACC/AHA guidelines, no further cardiovascular testing needed.  The patient may proceed to surgery at acceptable risk.   Antiplatelet and/or Anticoagulation Recommendations: The patient should remain on Aspirin without interruption.   I will review further with Dr. Elease Hashimoto to ensure that Prasugrel (Effient) can be held for 7 days prior to his surgery and resumed as soon as possible post op.

## 2023-06-14 NOTE — Assessment & Plan Note (Signed)
He notes shortness of breath with exertion.  This has gotten better the more active he has been.  He does not have any signs of volume excess on exam.  He has many reasons to be short of breath including coronary artery disease, ischemic cardiomyopathy, COPD.  He also has a history of right upper and right middle lobectomy secondary to empyema in 2016.  He has clubbing on exam.  I will update his echocardiogram to reassess his LV function.  If his EF is lower, we will try to adjust GDMT.  GDMT will be limited by low blood pressure.  Continue Toprol-XL 25 mg daily.  Follow-up 6 months.

## 2023-06-14 NOTE — Assessment & Plan Note (Signed)
Labs reviewed.  LDL in November 2023 was above goal at 30.  Increase Crestor to 20 mg daily.  Repeat CMET, lipids in 3 months.

## 2023-06-14 NOTE — Patient Instructions (Addendum)
Medication Instructions:  Your physician has recommended you make the following change in your medication:  INCREASE the Cresgtor to 20 mg taking 1 daily   *If you need a refill on your cardiac medications before your next appointment, please call your pharmacy*   Lab Work: 3 MONTHS:  FASTING CMET & LIPID  If you have labs (blood work) drawn today and your tests are completely normal, you will receive your results only by: MyChart Message (if you have MyChart) OR A paper copy in the mail If you have any lab test that is abnormal or we need to change your treatment, we will call you to review the results.   Testing/Procedures: Your physician has requested that you have an echocardiogram. Echocardiography is a painless test that uses sound waves to create images of your heart. It provides your doctor with information about the size and shape of your heart and how well your heart's chambers and valves are working. This procedure takes approximately one hour. There are no restrictions for this procedure. Please do NOT wear cologne, perfume, aftershave, or lotions (deodorant is allowed). Please arrive 15 minutes prior to your appointment time.    Follow-Up: At [facility], you and your health needs are our priority.  As part of our continuing mission to provide you with exceptional heart care, we have created designated Provider Care Teams.  These Care Teams include your primary Cardiologist (physician) and Advanced Practice Providers (APPs -  Physician Assistants and Nurse Practitioners) who all work together to provide you with the care you need, when you need it.  We recommend signing up for the patient portal called "MyChart".  Sign up information is provided on this After Visit Summary.  MyChart is used to connect with patients for Virtual Visits (Telemedicine).  Patients are able to view lab/test results, encounter notes, upcoming appointments, etc.  Non-urgent messages can be  sent to your provider as well.   To learn more about what you can do with MyChart, go to [email or web address].    Your next appointment:   6 month(s)  Provider:   Kristeen Miss, MD     Other Instructions You may hold your Effient 7 days prior to your procedure but DO NOT HOLD ASPIRIN

## 2023-06-15 ENCOUNTER — Other Ambulatory Visit: Payer: Self-pay | Admitting: Family

## 2023-06-15 NOTE — Telephone Encounter (Signed)
Patient advised that he has been given clearance to hold Effient 7 days prior to colonoscopy scheduled for 06-29-23.  Patient advised to take last dose of Effient on 06-21-23, and he will be advised when to restart Effient by Dr Rhea Belton after the procedure.  Patient advised that to continue Aspirin without interruption. Patient agreed to plan and verbalized understanding.  No further questions.

## 2023-06-16 ENCOUNTER — Other Ambulatory Visit: Payer: Self-pay | Admitting: Family

## 2023-06-29 ENCOUNTER — Ambulatory Visit: Payer: 59 | Admitting: Internal Medicine

## 2023-06-29 MED ORDER — SUTAB 1479-225-188 MG PO TABS: 12.0000 | ORAL_TABLET | ORAL | 0 refills | Status: DC

## 2023-06-29 NOTE — Progress Notes (Unsigned)
Colonoscopy rescheduled for 07/05/23. Patient instructed to hold Effinent until further instructions. Prep instructions reviewed with patient and order for Sutabs send to pharmacy. Patient agreed to POC.

## 2023-06-29 NOTE — Progress Notes (Unsigned)
Patient presenting for colonoscopy but only after having held Effient for 48 hours Unfortunately this necessitates a 7-day hold before any therapeutic intervention would be considered from a colonoscopic perspective  We will have him rescheduled.  He can remain off Effient and we can offer him an appointment on 07/05/2023 with another provider versus rescheduling entirely.

## 2023-07-04 ENCOUNTER — Other Ambulatory Visit: Payer: Self-pay

## 2023-07-04 ENCOUNTER — Encounter: Payer: Self-pay | Admitting: Certified Registered Nurse Anesthetist

## 2023-07-04 ENCOUNTER — Emergency Department (HOSPITAL_COMMUNITY)
Admission: EM | Admit: 2023-07-04 | Discharge: 2023-07-05 | Disposition: A | Discharge status: Home / Self Care | Payer: 59 | Attending: Emergency Medicine | Admitting: Emergency Medicine

## 2023-07-04 ENCOUNTER — Emergency Department (HOSPITAL_COMMUNITY): Payer: 59

## 2023-07-04 DIAGNOSIS — R519 Headache, unspecified: Secondary | ICD-10-CM | POA: Insufficient documentation

## 2023-07-04 DIAGNOSIS — F17200 Nicotine dependence, unspecified, uncomplicated: Secondary | ICD-10-CM | POA: Diagnosis not present

## 2023-07-04 DIAGNOSIS — R051 Acute cough: Secondary | ICD-10-CM

## 2023-07-04 DIAGNOSIS — Z7982 Long term (current) use of aspirin: Secondary | ICD-10-CM | POA: Insufficient documentation

## 2023-07-04 DIAGNOSIS — Z21 Asymptomatic human immunodeficiency virus [HIV] infection status: Secondary | ICD-10-CM | POA: Insufficient documentation

## 2023-07-04 DIAGNOSIS — Z7951 Long term (current) use of inhaled steroids: Secondary | ICD-10-CM | POA: Diagnosis not present

## 2023-07-04 DIAGNOSIS — J449 Chronic obstructive pulmonary disease, unspecified: Secondary | ICD-10-CM | POA: Diagnosis not present

## 2023-07-04 DIAGNOSIS — Z79899 Other long term (current) drug therapy: Secondary | ICD-10-CM | POA: Insufficient documentation

## 2023-07-04 DIAGNOSIS — J439 Emphysema, unspecified: Secondary | ICD-10-CM | POA: Diagnosis not present

## 2023-07-04 DIAGNOSIS — I2510 Atherosclerotic heart disease of native coronary artery without angina pectoris: Secondary | ICD-10-CM | POA: Diagnosis not present

## 2023-07-04 DIAGNOSIS — R059 Cough, unspecified: Secondary | ICD-10-CM | POA: Diagnosis not present

## 2023-07-04 DIAGNOSIS — R911 Solitary pulmonary nodule: Secondary | ICD-10-CM | POA: Diagnosis not present

## 2023-07-04 DIAGNOSIS — Z20822 Contact with and (suspected) exposure to covid-19: Secondary | ICD-10-CM | POA: Diagnosis not present

## 2023-07-04 DIAGNOSIS — M542 Cervicalgia: Secondary | ICD-10-CM | POA: Insufficient documentation

## 2023-07-04 DIAGNOSIS — R918 Other nonspecific abnormal finding of lung field: Secondary | ICD-10-CM | POA: Diagnosis not present

## 2023-07-04 LAB — CBC WITH DIFFERENTIAL/PLATELET
Abs Immature Granulocytes: 0.02 10*3/uL (ref 0.00–0.07)
Basophils Absolute: 0 10*3/uL (ref 0.0–0.1)
Basophils Relative: 0 %
Eosinophils Absolute: 0.1 10*3/uL (ref 0.0–0.5)
Eosinophils Relative: 1 %
HCT: 39.8 % (ref 39.0–52.0)
Hemoglobin: 14.8 g/dL (ref 13.0–17.0)
Immature Granulocytes: 0 %
Lymphocytes Relative: 37 %
Lymphs Abs: 3.9 10*3/uL (ref 0.7–4.0)
MCH: 33.9 pg (ref 26.0–34.0)
MCHC: 37.2 g/dL — ABNORMAL HIGH (ref 30.0–36.0)
MCV: 91.3 fL (ref 80.0–100.0)
Monocytes Absolute: 1 10*3/uL (ref 0.1–1.0)
Monocytes Relative: 10 %
Neutro Abs: 5.4 10*3/uL (ref 1.7–7.7)
Neutrophils Relative %: 52 %
Platelets: 219 10*3/uL (ref 150–400)
RBC: 4.36 MIL/uL (ref 4.22–5.81)
RDW: 13 % (ref 11.5–15.5)
WBC: 10.5 10*3/uL (ref 4.0–10.5)
nRBC: 0 % (ref 0.0–0.2)

## 2023-07-04 LAB — I-STAT CHEM 8, ED
BUN: 11 mg/dL (ref 6–20)
Calcium, Ion: 1.24 mmol/L (ref 1.15–1.40)
Chloride: 106 mmol/L (ref 98–111)
Creatinine, Ser: 1 mg/dL (ref 0.61–1.24)
Glucose, Bld: 85 mg/dL (ref 70–99)
HCT: 42 % (ref 39.0–52.0)
Hemoglobin: 14.3 g/dL (ref 13.0–17.0)
Potassium: 3.4 mmol/L — ABNORMAL LOW (ref 3.5–5.1)
Sodium: 139 mmol/L (ref 135–145)
TCO2: 24 mmol/L (ref 22–32)

## 2023-07-04 LAB — GROUP A STREP BY PCR: Group A Strep by PCR: NOT DETECTED

## 2023-07-04 LAB — RESP PANEL BY RT-PCR (RSV, FLU A&B, COVID)  RVPGX2
Influenza A by PCR: NEGATIVE
Influenza B by PCR: NEGATIVE
Resp Syncytial Virus by PCR: NEGATIVE
SARS Coronavirus 2 by RT PCR: NEGATIVE

## 2023-07-04 MED ORDER — ACETAMINOPHEN 500 MG PO TABS: 500.0000 mg | ORAL_TABLET | Freq: Four times a day (QID) | ORAL | 0 refills | Status: AC | PRN

## 2023-07-04 MED ORDER — KETOROLAC TROMETHAMINE 15 MG/ML IJ SOLN
15.0000 mg | Freq: Once | INTRAMUSCULAR | Status: AC
  Administered 2023-07-04: 15 mg via INTRAMUSCULAR
  Filled 2023-07-04: qty 1

## 2023-07-04 MED ORDER — ACETAMINOPHEN 500 MG PO TABS
1000.0000 mg | ORAL_TABLET | Freq: Once | ORAL | Status: AC
  Administered 2023-07-04: 1000 mg via ORAL
  Filled 2023-07-04: qty 2

## 2023-07-04 MED ORDER — BENZONATATE 100 MG PO CAPS: 100.0000 mg | ORAL_CAPSULE | Freq: Three times a day (TID) | ORAL | 0 refills | Status: DC

## 2023-07-04 NOTE — ED Provider Notes (Signed)
EMERGENCY DEPARTMENT AT [facility] Provider Note   CSN: [id number] Arrival date & time: 07/04/23  8416     History  Chief Complaint  Patient presents with   Neck Pain    Rodney Walker is a 42 y.o. male.  The history is provided by the patient and medical records. No language interpreter was used.  Neck Pain      Rodney Walker is a 42 year old male.  Patient with a history significant for HIV, COPD, and CAD presents to the ED with 3 day history of neck pain and low grade fever. He states that his neck feels strange down lower than a normal sore throat. He also complains of a cough with a change in color of the sputum. He has sinus pressure and facial pain. Denies ear pain, denies rashes. Pt has been taking Tylenol at home and the pain and temperature have been responding to the Tylenol. He has not taken any Tylenol today.  Patient also report that his last CD4 count is greater than 1000 and his viral load is undetectable and that was back in April of this year.  He has follow-up with an ID specialist.  States that he takes his HIV medication regularly.  Home Medications Prior to Admission medications   Medication Sig Start Date End Date Taking? Authorizing Provider  albuterol (VENTOLIN HFA) 108 (90 Base) MCG/ACT inhaler Inhale 2 puffs into the lungs every 6 (six) hours as needed for wheezing or shortness of breath. 11/24/22   Veryl Speak, FNP  aspirin EC 81 MG tablet Take 81 mg by mouth daily. Swallow whole.    [provider]  bictegravir-emtricitabine-tenofovir AF (BIKTARVY) 50-200-25 MG TABS tablet Take 1 tablet by mouth daily. 03/25/23   Veryl Speak, FNP  cetirizine (ZYRTEC) 10 MG tablet TAKE 1 TABLET(10 MG) BY MOUTH DAILY 06/16/23   Veryl Speak, FNP  EPINEPHrine 0.3 mg/0.3 mL IJ SOAJ injection Inject 0.3 mg into the muscle as needed for anaphylaxis.    [provider]  flunisolide (NASALIDE) 25 MCG/ACT  (0.025%) SOLN Place 2 sprays into the nose daily as needed (allergies). 09/17/22   [provider]  metoprolol succinate (TOPROL-XL) 25 MG 24 hr tablet Take 1 tablet (25 mg total) by mouth daily. Please make overdue appt with Dr. Elease Hashimoto before anymore refills. Thank you 2nd attempt 02/19/22   [name], [name], MD  Na Sulfate-K Sulfate-Mg Sulf (SUPREP BOWEL PREP KIT) 17.5-3.13-1.6 GM/177ML SOLN Take 1 kit by mouth as directed. 05/14/23   Meredith Pel, NP  nitroGLYCERIN (NITROSTAT) 0.4 MG SL tablet Place 1 tablet (0.4 mg total) under the tongue every 5 (five) minutes x 3 doses as needed for chest pain. 06/27/20   [name], [name], MD  prasugrel (EFFIENT) 10 MG TABS tablet Take 10 mg by mouth daily.    [provider]  rosuvastatin (CRESTOR) 20 MG tablet Take 1 tablet (20 mg total) by mouth daily. 06/14/23 09/12/23  Tereso Newcomer T, PA-C  SUTAB [phone] MG TABS Take 12 tablets by mouth as directed. 06/29/23   Pyrtle, Carie Caddy, MD      Allergies    Patient has no known allergies.    Review of Systems   Review of Systems  Musculoskeletal:  Positive for neck pain.  All other systems reviewed and are negative.   Physical Exam Updated Vital Signs BP 122/83   Pulse 98   Temp 100.1 F (37.8 C) (Oral)  Resp 18   Ht 5\' 5"  (1.651 m)   Wt 59.2 kg   SpO2 95%   BMI 21.72 kg/m  Physical Exam Vitals and nursing note reviewed.  Constitutional:      General: He is not in acute distress.    Appearance: He is well-developed.  HENT:     Head: Atraumatic.     Nose: Nose normal.     Mouth/Throat:     Mouth: Mucous membranes are moist.  Eyes:     Extraocular Movements: Extraocular movements intact.     Conjunctiva/sclera: Conjunctivae normal.     Pupils: Pupils are equal, round, and reactive to light.  Neck:     Vascular: No carotid bruit.  Cardiovascular:     Rate and Rhythm: Normal rate and regular rhythm.     Pulses: Normal pulses.     Heart sounds: Normal heart sounds.   Pulmonary:     Breath sounds: No wheezing, rhonchi or rales.  Abdominal:     Palpations: Abdomen is soft.     Tenderness: There is no abdominal tenderness.  Musculoskeletal:     Cervical back: Normal range of motion and neck supple. No rigidity or tenderness.  Lymphadenopathy:     Cervical: No cervical adenopathy.  Skin:    Findings: No rash.  Neurological:     Mental Status: He is alert.     ED Results / Procedures / Treatments   Labs (all labs ordered are listed, but only abnormal results are displayed) Labs Reviewed  CBC WITH DIFFERENTIAL/PLATELET - Abnormal; Notable for the following components:      Result Value   MCHC 37.2 (*)    All other components within normal limits  RESP PANEL BY RT-PCR (RSV, FLU A&B, COVID)  [id number]  GROUP A STREP BY PCR  I-STAT CHEM 8, ED    EKG None  Radiology DG Chest 2 View  Result Date: 07/04/2023 CLINICAL DATA:  Cough EXAM: CHEST - 2 VIEW COMPARISON:  07/28/2022 FINDINGS: There is hyperinflation of the lungs compatible with COPD. Postoperative changes in the right upper lobe with architectural distortion and scarring. Increased soft tissue in the right upper lung adjacent to the surgical clips since prior chest x-ray. Left lung clear. Heart mediastinal contours within normal limits. No acute bony abnormality. IMPRESSION: Postoperative changes with scarring and architectural distortion in the right upper lobe. Increasing soft tissue in the region since prior study. Consider further evaluation with chest CT with IV contrast to exclude mass. Electronically Signed   By: Charlett Nose M.D.   On: 07/04/2023 21:03    Procedures Procedures    Medications Ordered in ED Medications  acetaminophen (TYLENOL) tablet 1,000 mg (1,000 mg Oral Given 07/04/23 2033)  ketorolac (TORADOL) 15 MG/ML injection 15 mg (15 mg Intramuscular Given 07/04/23 2141)    ED Course/ Medical Decision Making/ A&P Clinical Course as of 07/04/23 2236  Sun Jul 04, 2023  2235  DG Chest 2 View [KW]    Clinical Course User Index [KW] Milly Jakob                             Medical Decision Making Amount and/or Complexity of Data Reviewed Labs: ordered. Radiology: ordered.  Risk OTC drugs. Prescription drug management.   BP 122/83   Pulse 98   Temp 100.1 F (37.8 C) (Oral)   Resp 18   Ht 5\' 5"  (1.651 m)   Wt 59.2 kg  SpO2 95%   BMI 21.72 kg/m   10:44 PM Rodney Walker is a 42 year old male.  Patient with a history significant for HIV, COPD, and CAD presents to the ED with 3 day history of neck pain and low grade fever. He states that his neck feels strange down lower than a normal sore throat. He also complains of a cough with a change in color of the sputum. He has sinus pressure and facial pain. Denies ear pain, denies rashes. Pt has been taking Tylenol at home and the pain and temperature have been responding to the Tylenol. He has not taken any Tylenol today.  Patient also report that his last CD4 count is greater than 1000 and his viral load is undetectable and that was back in April of this year.  He has follow-up with an ID specialist.  States that he takes his HIV medication regularly.  On exam this is a well-appearing male sitting in the chair appears to be in no acute discomfort.  Ear nose and throat exam unremarkable, neck is supple trachea midline no cervical lymphadenopathy noted.  Lungs clear to auscultation bilaterally abdomen is soft and nontender heart with normal rate and rhythm  Vital signs with an oral temperature of 100.1 otherwise he is not tachycardic no hypoxia noted.  -Labs ordered, independently viewed and interpreted by me.  Labs remarkable for normal WBC, normal H&H.  Negative covid, flu, or RSV -The patient was maintained on a cardiac monitor.  I personally viewed and interpreted the cardiac monitored which showed an underlying rhythm of: NSR -Imaging independently viewed and interpreted by me and I  agree with radiologist's interpretation.  Result remarkable for CXR showing increasing soft tissue in the RUL.  Radiologist recommend chest CT with IV CM to exclude mass -This patient presents to the ED for concern of sore throat, this involves an extensive number of treatment options, and is a complaint that carries with it a high risk of complications and morbidity.  The differential diagnosis includes strep, mono, RSV, covid, flu, viral pharyngitis, malignancy -Co morbidities that complicate the patient evaluation includes HIV, CAD, tobacco use -Treatment includes tylenol, toradol -Reevaluation of the patient after these medicines showed that the patient improved -PCP office notes or outside notes reviewed -Discussion with oncoming provider who will f/u on CT result and determine disposition -Escalation to admission/observation considered: disposition pending.  If Ct unremarkable, pt may be d/c home with supportive care.         Final Clinical Impression(s) / ED Diagnoses Final diagnoses:  None    Rx / DC Orders ED Discharge Orders     None         Fayrene Helper, PA-C 07/04/23 2329    Glyn Ade, MD 07/09/23 1601

## 2023-07-04 NOTE — ED Triage Notes (Signed)
The pt is c/o neck soreness for 3 days unknown temp

## 2023-07-05 ENCOUNTER — Encounter: Payer: 59 | Admitting: Gastroenterology

## 2023-07-05 ENCOUNTER — Emergency Department (HOSPITAL_COMMUNITY): Payer: 59

## 2023-07-05 ENCOUNTER — Telehealth: Payer: Self-pay

## 2023-07-05 ENCOUNTER — Telehealth: Payer: Self-pay | Admitting: Gastroenterology

## 2023-07-05 DIAGNOSIS — R918 Other nonspecific abnormal finding of lung field: Secondary | ICD-10-CM | POA: Diagnosis not present

## 2023-07-05 DIAGNOSIS — M542 Cervicalgia: Secondary | ICD-10-CM | POA: Diagnosis not present

## 2023-07-05 DIAGNOSIS — R911 Solitary pulmonary nodule: Secondary | ICD-10-CM | POA: Diagnosis not present

## 2023-07-05 DIAGNOSIS — J439 Emphysema, unspecified: Secondary | ICD-10-CM | POA: Diagnosis not present

## 2023-07-05 MED ORDER — BENZONATATE 100 MG PO CAPS: 100.0000 mg | ORAL_CAPSULE | Freq: Three times a day (TID) | ORAL | 0 refills | Status: DC

## 2023-07-05 MED ORDER — IOHEXOL 350 MG/ML SOLN
75.0000 mL | Freq: Once | INTRAVENOUS | Status: AC | PRN
  Administered 2023-07-05: 75 mL via INTRAVENOUS

## 2023-07-05 NOTE — ED Provider Notes (Signed)
12:39 AM Results for orders placed or performed during the hospital encounter of 07/04/23  Resp panel by RT-PCR (RSV, Flu A&B, Covid) Anterior Nasal Swab   Specimen: Anterior Nasal Swab  Result Value Ref Range   SARS Coronavirus 2 by RT PCR NEGATIVE NEGATIVE   Influenza A by PCR NEGATIVE NEGATIVE   Influenza B by PCR NEGATIVE NEGATIVE   Resp Syncytial Virus by PCR NEGATIVE NEGATIVE  Group A Strep by PCR   Specimen: Anterior Nasal Swab; Sterile Swab  Result Value Ref Range   Group A Strep by PCR NOT DETECTED NOT DETECTED  CBC with Differential  Result Value Ref Range   WBC 10.5 4.0 - 10.5 K/uL   RBC 4.36 4.22 - 5.81 MIL/uL   Hemoglobin 14.8 13.0 - 17.0 g/dL   HCT 16.1 09.6 - 04.5 %   MCV 91.3 80.0 - 100.0 fL   MCH 33.9 26.0 - 34.0 pg   MCHC 37.2 (H) 30.0 - 36.0 g/dL   RDW 40.9 81.1 - 91.4 %   Platelets 219 150 - 400 K/uL   nRBC 0.0 0.0 - 0.2 %   Neutrophils Relative % 52 %   Neutro Abs 5.4 1.7 - 7.7 K/uL   Lymphocytes Relative 37 %   Lymphs Abs 3.9 0.7 - 4.0 K/uL   Monocytes Relative 10 %   Monocytes Absolute 1.0 0.1 - 1.0 K/uL   Eosinophils Relative 1 %   Eosinophils Absolute 0.1 0.0 - 0.5 K/uL   Basophils Relative 0 %   Basophils Absolute 0.0 0.0 - 0.1 K/uL   Immature Granulocytes 0 %   Abs Immature Granulocytes 0.02 0.00 - 0.07 K/uL  I-stat chem 8, ED (not at [facility], [facility] or [facility])  Result Value Ref Range   Sodium 139 135 - 145 mmol/L   Potassium 3.4 (L) 3.5 - 5.1 mmol/L   Chloride 106 98 - 111 mmol/L   BUN 11 6 - 20 mg/dL   Creatinine, Ser 7.82 0.61 - 1.24 mg/dL   Glucose, Bld 85 70 - 99 mg/dL   Calcium, Ion 9.56 2.13 - 1.40 mmol/L   TCO2 24 22 - 32 mmol/L   Hemoglobin 14.3 13.0 - 17.0 g/dL   HCT 08.6 57.8 - 46.9 %   CT Chest W Contrast  Result Date: 07/05/2023 CLINICAL DATA:  Lung nodule EXAM: CT CHEST WITH CONTRAST TECHNIQUE: Multidetector CT imaging of the chest was performed during intravenous contrast administration. RADIATION DOSE REDUCTION: This exam was  performed according to the departmental dose-optimization program which includes automated exposure control, adjustment of the mA and/or kV according to patient size and/or use of iterative reconstruction technique. CONTRAST:  75mL OMNIPAQUE IOHEXOL 350 MG/ML SOLN COMPARISON:  Chest x-ray 07/04/2023.  CT of the chest 07/28/2022 FINDINGS: Cardiovascular: No significant vascular findings. Normal heart size. No pericardial effusion. Mediastinum/Nodes: There are calcified lymph nodes throughout the mediastinum similar to the prior study. There are also calcified bilateral hilar lymph nodes similar to the prior examination. No new enlarged lymph nodes are seen. Visualized esophagus and thyroid gland are within normal limits. Lungs/Pleura: Severe emphysematous changes are again seen similar to the prior study. Irregular parenchymal opacity with some interstitial thickening and architectural distortion in the right upper lung appear similar to the prior study. There are multiple surgical clips in this region. Focal slightly nodular lingular opacity measures 10 by 6 mm and appears similar to prior. Left upper lobe 4 mm nodule appears unchanged. No new pulmonary nodules are identified. There is no  new focal lung infiltrate, pleural effusion or pneumothorax. Trachea and central airways appear patent. Upper Abdomen: No acute abnormality. Musculoskeletal: No chest wall abnormality. No acute or significant osseous findings. IMPRESSION: 1. Stable postsurgical changes and parenchymal opacities in the right upper lung. 2. Stable indeterminate left upper lobe 4 mm nodule. 3. Stable indeterminate nodular opacity in the lingula measuring 10 x 6 mm. 4. Severe emphysema. 5. Stable calcified mediastinal and hilar lymph nodes compatible with prior granulomatous disease. Emphysema (ICD10-J43.9). Electronically Signed   By: Darliss Cheney M.D.   On: 07/05/2023 00:31   DG Chest 2 View  Result Date: 07/04/2023 CLINICAL DATA:  Cough EXAM:  CHEST - 2 VIEW COMPARISON:  07/28/2022 FINDINGS: There is hyperinflation of the lungs compatible with COPD. Postoperative changes in the right upper lobe with architectural distortion and scarring. Increased soft tissue in the right upper lung adjacent to the surgical clips since prior chest x-ray. Left lung clear. Heart mediastinal contours within normal limits. No acute bony abnormality. IMPRESSION: Postoperative changes with scarring and architectural distortion in the right upper lobe. Increasing soft tissue in the region since prior study. Consider further evaluation with chest CT with IV contrast to exclude mass. Electronically Signed   By: Charlett Nose M.D.   On: 07/04/2023 21:03    CT with no acute findings-- has some nodules and scarring stable from prior.  No acute pneumonia.  Negative RVP and strep test.  Normal WBC count.  Stable for discharge.  He is aware of lung nodules-- encouraged to have PCP follow-up on these.  Can return here for any new/acute changes.   Garlon Hatchet, PA-C 07/05/23 Tarri Abernethy, April, MD 07/05/23 [phone]

## 2023-07-05 NOTE — Telephone Encounter (Signed)
Spoke with pt and let him know the procedure will need to be rescheduled and that he will need to hold his effient for 7 days prior to the procedure, plan to reschedule for a few weeks out. Pt states he does not think he is taking effient. Note sent to cardiology office, Tereso Newcomer PA, to please confirm if pt is taking effient.

## 2023-07-05 NOTE — Discharge Instructions (Signed)
Take the prescribed medication as directed. °Follow-up with your primary care doctor. °Return to the ED for new or worsening symptoms. °

## 2023-07-05 NOTE — Telephone Encounter (Signed)
Pt was supposed to have procedure done today but had fever and cough and was seen in the ED. Discussed with pt that his procedure will need to be rescheduled to another date and that he needed to hold his effient for 7 days prior to the procedure. Pt states he is a little confused and does not think he is taking effient. Pt does not think it is in his "bag of pills." The effient in the system states it was last entered in 2023 under historical provider. Please advise if pt is taking the effient.

## 2023-07-05 NOTE — Telephone Encounter (Signed)
Hi Dr Adela Lank,  I called patient regarding his procedure appointment, he states he couldn't make it due to him coughing green mucus and had been to ED yesterday. He also stated that he called this morning for advise on what to do next but was told a nurse will return his call. However there's no phone call or message in Epic. Please advice.    Thank you

## 2023-07-05 NOTE — Telephone Encounter (Signed)
He should call his PCP or ID doc about follow up for the cough / fever he is having (ED note reviewed). He will need to reschedule for another time and give some time for him to recover from his illness.   He had been off Effient for this exam for a 7 day hold as he did not hold it enough for his prior exam with Dr. Rhea Belton.  I would not schedule him this week given he was febrile yesterday in the ED. Can resume Effient in this light now, but will need to hold it again for 7 days prior to his next scheduled procedure.   Bonita Quin this is a Dr. Rhea Belton patient. I helped out with trying to get this case done for him given he showed up not holding Effient before, however can you discuss with him when he wants to be rescheduled. Maybe in next few weeks if [facility] has an opening, or one of our colleagues

## 2023-07-06 NOTE — Telephone Encounter (Signed)
Please contact patient and make sure he is still taking aspirin and Prasugrel (Effient). He has been maintained on both of these since his last heart attack in 2019. Plan was to hold it for 7 days prior to his colonoscopy, then resume it. Tereso Newcomer, PA-C    07/06/2023 8:11 AM

## 2023-07-06 NOTE — Telephone Encounter (Signed)
Reviewed last note from ID. Prasugrel was not stopped. He should resume Prasugrel 10 mg once daily and continue ASA 81 mg once daily in addition to his other meds. Ask patient if he has "Prasugrel" in his medications. If not, refill Prasugrel 10 mg once daily. Tereso Newcomer, PA-C    07/06/2023 10:54 PM

## 2023-07-06 NOTE — Telephone Encounter (Signed)
Call placed to pt.  He states he hasn't taken Effient for months, and he believed the Infectious Disease MD took him off of it.  I couldn't find it in any of his notes.  Pt said he was advised that he had been on it for a long time and Aspirin should be enough blood thinner right now.  Pt aware will send to Tereso Newcomer, PA-C to see what he recommends.

## 2023-07-07 ENCOUNTER — Other Ambulatory Visit: Payer: Self-pay

## 2023-07-07 DIAGNOSIS — K625 Hemorrhage of anus and rectum: Secondary | ICD-10-CM

## 2023-07-07 MED ORDER — PRASUGREL HCL 10 MG PO TABS: 10.0000 mg | ORAL_TABLET | Freq: Every day | ORAL | 11 refills | Status: DC

## 2023-07-07 NOTE — Telephone Encounter (Signed)
Returned call to pt.  He confirms that he does not have this medication and is aware that we have sent in a new prescription and this will be in addition to the Aspirin.   Pt verbalized understanding.

## 2023-07-07 NOTE — Telephone Encounter (Signed)
Pts colon rescheduled in the [facility] 07/15/23 at 3pm, pt to arrive here at 2pm. New amb ref in epic, new instructions sent to pt via mychart. Pt has not been taking Effient for quite some time but is supposed to start it back. Instructed pt to wait until after procedure to start the med. Pt verbalized understanding.

## 2023-07-08 ENCOUNTER — Telehealth: Payer: Self-pay | Admitting: *Deleted

## 2023-07-08 ENCOUNTER — Encounter: Payer: Self-pay | Admitting: *Deleted

## 2023-07-08 NOTE — Transitions of Care (Post Inpatient/ED Visit) (Signed)
07/08/2023  Name: Rodney Walker MRN: [id number] DOB: 1981/01/06  Today's TOC FU Call Status: Today's TOC FU Call Status:: Successful TOC FU Call Competed TOC FU Call Complete Date: 07/08/23  ED EMMI Red Alert notification on 07/07/23 from ED visit 07/04/23- automated EMMI call placed 07/06/23: "No scheduled follow up"  Transition Care Management Follow-up Telephone Call Date of Discharge: 07/04/23 Discharge Facility: [facility]) Type of Discharge: Emergency Department Reason for ED Visit: Respiratory Respiratory Diagnosis:  (cough, low grade fever, neck pain) How have you been since you were released from the hospital?: Better Any questions or concerns?: No  Items Reviewed: Did you receive and understand the discharge instructions provided?: Yes (thoroughly reviewed with patient who verbalizes good understanding of same) Medications obtained,verified, and reconciled?: Yes (Medications Reviewed) (Full medication reconciliation/ review completed; no concerns or discrepancies identified; confirmed patient obtained/ is taking all newly Rx'd medications as instructed; self-manages medications and denies questions/ concerns around medications today) Any new allergies since your discharge?: No Dietary orders reviewed?: Yes Type of Diet Ordered:: Regular Do you have support at home?: Yes People in Home: alone Name of Support/Comfort Primary Source: Reports resides alone and is independent in self-care activities; supportive frineds/ family assists as/ if needed/ indicated  Medications Reviewed Today: Medications Reviewed Today     Reviewed by Michaela Corner, RN (Registered Nurse) on 07/08/23 at 1029  Med List Status: <None>   Medication Order Taking? Sig Documenting Provider Last Dose Status Informant  acetaminophen (TYLENOL) 500 MG tablet [id number] Yes Take 1 tablet (500 mg total) by mouth every 6 (six) hours as needed. Fayrene Helper, PA-C Taking Active   albuterol  (VENTOLIN HFA) 108 (90 Base) MCG/ACT inhaler [id number] No Inhale 2 puffs into the lungs every 6 (six) hours as needed for wheezing or shortness of breath.  Patient not taking: Reported on 07/08/2023   Veryl Speak, FNP Not Taking Active            Med Note Jonnie Kind Jul 08, 2023 10:22 AM) 07/08/23: Reports during [facility] call has never used  aspirin EC 81 MG tablet [id number] Yes Take 81 mg by mouth daily. Swallow whole. [provider] Taking Active Self  benzonatate (TESSALON) 100 MG capsule [id number] Yes Take 1 capsule (100 mg total) by mouth every 8 (eight) hours. Garlon Hatchet, PA-C Taking Active   bictegravir-emtricitabine-tenofovir AF (BIKTARVY) 50-200-25 MG TABS tablet [id number] Yes Take 1 tablet by mouth daily. Veryl Speak, FNP Taking Active   cetirizine (ZYRTEC) 10 MG tablet [id number] Yes TAKE 1 TABLET(10 MG) BY MOUTH DAILY Veryl Speak, FNP Taking Active   EPINEPHrine 0.3 mg/0.3 mL IJ SOAJ injection [id number] Yes Inject 0.3 mg into the muscle as needed for anaphylaxis. [provider] Taking Active Self           Med Note Jonnie Kind Jul 08, 2023 10:25 AM) 07/08/23: Reports during [facility] call has medicine but has never used-- reports he thinks it is now expired and may need updated Rx-- has PCP office visit 07/19/23   flunisolide (NASALIDE) 25 MCG/ACT (0.025%) SOLN [id number] No Place 2 sprays into the nose daily as needed (allergies).  Patient not taking: Reported on 07/08/2023   [provider] Not Taking Active Self  metoprolol succinate (TOPROL-XL) 25 MG 24 hr tablet [id number] Yes Take 1 tablet (25 mg total) by mouth daily. Please make overdue appt with Dr. Elease Hashimoto before anymore refills. Thank you  2nd attempt [name], [name], MD Taking Active Self           Med Note Monico Hoar Jun 14, 2023  1:56 PM)    Na Sulfate-K Sulfate-Mg Sulf (SUPREP BOWEL PREP KIT) 17.5-3.13-1.6 GM/177ML SOLN [id number] Yes Take 1 kit by mouth  as directed. Meredith Pel, NP Taking Active   nitroGLYCERIN (NITROSTAT) 0.4 MG SL tablet [id number] No Place 1 tablet (0.4 mg total) under the tongue every 5 (five) minutes x 3 doses as needed for chest pain.  Patient not taking: Reported on 07/08/2023   [name], [name], MD Not Taking Active Self           Med Note Jonnie Kind Jul 08, 2023 10:28 AM) 07/08/23: Reports during TOC call-- does not have this Rx and is not taking   prasugrel (EFFIENT) 10 MG TABS tablet [id number] No Take 1 tablet (10 mg total) by mouth daily.  Patient not taking: Reported on 07/08/2023   Kennon Rounds Not Taking Active            Med Note Jonnie Kind Jul 08, 2023 10:27 AM) 07/08/23: reports during [facility] call he is not currently taking- states he will resume after upcoming colonoscopy on 07/15/23   rosuvastatin (CRESTOR) 20 MG tablet [id number] Yes Take 1 tablet (20 mg total) by mouth daily. Kennon Rounds Taking Active   SUTAB [phone] MG TABS [id number] Yes Take 12 tablets by mouth as directed. Beverley Fiedler, MD Taking Active            Med Note Jonnie Kind Jul 08, 2023 10:29 AM) 07/08/23: reports during [facility] call, this is part of the colonoscopy prep-- reports has instructions on how to take            Home Care and Equipment/Supplies: Were Home Health Services Ordered?: NA Any new equipment or medical supplies ordered?: NA  Functional Questionnaire: Do you need assistance with bathing/showering or dressing?: No Do you need assistance with meal preparation?: No Do you need assistance with eating?: No Do you have difficulty maintaining continence: No Do you need assistance with getting out of bed/getting out of a chair/moving?: No Do you have difficulty managing or taking your medications?: No  Follow up appointments reviewed: PCP Follow-up appointment confirmed?: Yes (care coordination outreach in real-time with scheduling care guide to successfully  schedule hospital follow up PCP appointment 07/19/23) Date of PCP follow-up appointment?: 07/19/23 Follow-up Provider: PCP Specialist Hospital Follow-up appointment confirmed?: Yes Date of Specialist follow-up appointment?: 07/15/23 Follow-Up Specialty Provider:: GI- colonoscopy; also has ID specialist provider on same day 07/15/23-- states he is going to re-schedule this today, confirms has contact information for ID provider Do you need transportation to your follow-up appointment?: No Do you understand care options if your condition(s) worsen?: Yes-patient verbalized understanding  SDOH Interventions Today    Flowsheet Row Most Recent Value  SDOH Interventions   Food Insecurity Interventions Intervention Not Indicated  Transportation Interventions Intervention Not Indicated  [drives self at baseline- friend to transport to upcoming colonoscopy 07/15/23]      TOC Interventions Today    Flowsheet Row Most Recent Value  TOC Interventions   TOC Interventions Discussed/Reviewed TOC Interventions Discussed, Arranged PCP follow up less than 12 days/Care Guide scheduled  [facilitated scheduling of post-ED PCP appointment-- first available is 15 days post ED discharge]      Interventions Today  Flowsheet Row Most Recent Value  Chronic Disease   Chronic disease during today's visit Other  [cough in setting of HIV]  General Interventions   General Interventions Discussed/Reviewed General Interventions Discussed, Doctor Visits, Durable Medical Equipment (DME)  Doctor Visits Discussed/Reviewed Doctor Visits Discussed, PCP, Specialist  Durable Medical Equipment (DME) Other  [confirmed not currently requiring/ using assistive devices]  PCP/Specialist Visits Compliance with follow-up visit  Education Interventions   Education Provided Provided Education  Provided Verbal Education On Other  [reinforced prep for upcoming colonoscopy]  Nutrition Interventions   Nutrition Discussed/Reviewed  Nutrition Discussed  Pharmacy Interventions   Pharmacy Dicussed/Reviewed Pharmacy Topics Discussed  [Full medication review with updating medication list in EHR per patient report]      Caryl Pina, RN, BSN, CCRN Alumnus RN CM Care Coordination/ Transition of Care- [facility] Care Management [phone]: direct office

## 2023-07-14 ENCOUNTER — Encounter: Payer: Self-pay | Admitting: Certified Registered Nurse Anesthetist

## 2023-07-14 ENCOUNTER — Ambulatory Visit (HOSPITAL_COMMUNITY): Payer: 59

## 2023-07-15 ENCOUNTER — Ambulatory Visit: Payer: 59 | Admitting: Family

## 2023-07-15 ENCOUNTER — Ambulatory Visit (AMBULATORY_SURGERY_CENTER): Payer: 59 | Admitting: Internal Medicine

## 2023-07-15 ENCOUNTER — Encounter: Payer: Self-pay | Admitting: Internal Medicine

## 2023-07-15 VITALS — BP 107/71 | HR 79 | Temp 96.6°F | Resp 13 | Ht 65.0 in | Wt 134.0 lb

## 2023-07-15 DIAGNOSIS — K5140 Inflammatory polyps of colon without complications: Secondary | ICD-10-CM | POA: Diagnosis not present

## 2023-07-15 DIAGNOSIS — K625 Hemorrhage of anus and rectum: Secondary | ICD-10-CM | POA: Diagnosis not present

## 2023-07-15 DIAGNOSIS — D125 Benign neoplasm of sigmoid colon: Secondary | ICD-10-CM

## 2023-07-15 DIAGNOSIS — J449 Chronic obstructive pulmonary disease, unspecified: Secondary | ICD-10-CM | POA: Diagnosis not present

## 2023-07-15 MED ORDER — SODIUM CHLORIDE 0.9 % IV SOLN
500.0000 mL | INTRAVENOUS | Status: DC
Start: 2023-07-15 — End: 2023-07-15

## 2023-07-15 NOTE — Progress Notes (Signed)
GASTROENTEROLOGY PROCEDURE H&P NOTE   Primary Care Physician: Georgina Quint, MD    Reason for Procedure:  Rectal bleeding  Plan:    Colonoscopy  Patient is appropriate for endoscopic procedure(s) in the ambulatory ([facility]) setting.  The nature of the procedure, as well as the risks, benefits, and alternatives were carefully and thoroughly reviewed with the patient. Ample time for discussion and questions allowed. The patient understood, was satisfied, and agreed to proceed.     HPI: Rodney Walker is a 42 y.o. male who presents for colonoscopy.  Medical history as below.  Tolerated the prep.  No recent chest pain or shortness of breath.  No abdominal pain today.  Effient has been on hold for an appropriate period of time.  Past Medical History:  Diagnosis Date   Anemia    COPD (chronic obstructive pulmonary disease) (HCC)    Coronary artery disease    MI 2010 - dissection/plaque rupture in LAD tx with antiplt/antithrombotics >> relook cath with resolution (no PCI performed) // Inf STEMI 3/19: OM1 30; pRCA 100 >> DES // Echo 3/19:  mild LVH, EF 40-45, inf/inf-sept HK, trivial MR   Eczema    GERD (gastroesophageal reflux disease)    History of blood clots    History of myocardial infarction 2010; 2019   History of pleural empyema    HIV infection (HCC)    Hyperlipidemia    Hypertension    Immune deficiency disorder (HCC)    Ischemic cardiomyopathy 04/05/2018   Moderate persistent asthma without complication 10/29/2016   Myocardial infarction (HCC)    2009, 2019   Pneumonia     Past Surgical History:  Procedure Laterality Date   CARDIAC CATHETERIZATION  06/24/2009   EF 60%   CARDIAC CATHETERIZATION  06/17/2009   EF 45%. ANTERIOR HYPOKINESIS   CORONARY STENT INTERVENTION N/A 03/24/2018   Procedure: CORONARY STENT INTERVENTION;  Surgeon: Tonny Bollman, MD;  Location: [facility] INVASIVE CV LAB;  Service: Cardiovascular;  Laterality: N/A;   CORONARY/GRAFT ACUTE  MI REVASCULARIZATION N/A 03/24/2018   Procedure: Coronary/Graft Acute MI Revascularization;  Surgeon: Tonny Bollman, MD;  Location: [facility] INVASIVE CV LAB;  Service: Cardiovascular;  Laterality: N/A;   LEFT HEART CATH AND CORONARY ANGIOGRAPHY N/A 03/24/2018   Procedure: LEFT HEART CATH AND CORONARY ANGIOGRAPHY;  Surgeon: Tonny Bollman, MD;  Location: [facility] INVASIVE CV LAB;  Service: Cardiovascular;  Laterality: N/A;   SINUS IRRIGATION  10/08/2022   Procedure: BRONCHIAL LAVAGE;  Surgeon: Lupita Leash, MD;  Location: [facility] ENDOSCOPY;  Service: Cardiopulmonary;;   TOTAL HIP ARTHROPLASTY Right 09/25/2019   Procedure: RIGHT TOTAL HIP ARTHROPLASTY ANTERIOR APPROACH;  Surgeon: Tarry Kos, MD;  Location: [facility] OR;  Service: Orthopedics;  Laterality: Right;   US ECHOCARDIOGRAPHY  12/09/2009   EF 55-60%   VIDEO ASSISTED THORACOSCOPY (VATS)/ LOBECTOMY Right 04/23/2015   Procedure: VIDEO ASSISTED THORACOSCOPY (VATS)/RIGHT upper  and right middle LOBECTOMY;  Surgeon: Kerin Perna, MD;  Location: [facility] OR;  Service: Thoracic;  Laterality: Right;   VIDEO BRONCHOSCOPY Bilateral 09/05/2014   Procedure: VIDEO BRONCHOSCOPY WITH FLUORO;  Surgeon: Merwyn Katos, MD;  Location: [facility] ENDOSCOPY;  Service: Cardiopulmonary;  Laterality: Bilateral;   VIDEO BRONCHOSCOPY Bilateral 04/10/2015   Procedure: VIDEO BRONCHOSCOPY WITH FLUORO;  Surgeon: Lupita Leash, MD;  Location: [facility] ENDOSCOPY;  Service: Cardiopulmonary;  Laterality: Bilateral;   VIDEO BRONCHOSCOPY N/A 10/08/2022   Procedure: VIDEO BRONCHOSCOPY WITHOUT FLUORO;  Surgeon: Lupita Leash, MD;  Location: [facility] ENDOSCOPY;  Service: Cardiopulmonary;  Laterality: N/A;    Prior to Admission medications   Medication Sig Start Date End Date Taking? Authorizing Provider  acetaminophen (TYLENOL) 500 MG tablet Take 1 tablet (500 mg total) by mouth every 6 (six) hours as needed. 07/04/23  Yes Fayrene Helper, PA-C  aspirin EC 81 MG tablet Take 81 mg by mouth daily. Swallow whole.    Yes [provider]  benzonatate (TESSALON) 100 MG capsule Take 1 capsule (100 mg total) by mouth every 8 (eight) hours. 07/05/23  Yes Garlon Hatchet, PA-C  bictegravir-emtricitabine-tenofovir AF (BIKTARVY) 50-200-25 MG TABS tablet Take 1 tablet by mouth daily. 03/25/23  Yes Veryl Speak, FNP  cetirizine (ZYRTEC) 10 MG tablet TAKE 1 TABLET(10 MG) BY MOUTH DAILY 06/16/23  Yes Veryl Speak, FNP  rosuvastatin (CRESTOR) 20 MG tablet Take 1 tablet (20 mg total) by mouth daily. 06/14/23 09/12/23 Yes Weaver, Scott T, PA-C  albuterol (VENTOLIN HFA) 108 (90 Base) MCG/ACT inhaler Inhale 2 puffs into the lungs every 6 (six) hours as needed for wheezing or shortness of breath. Patient not taking: Reported on 07/08/2023 11/24/22   Veryl Speak, FNP  EPINEPHrine 0.3 mg/0.3 mL IJ SOAJ injection Inject 0.3 mg into the muscle as needed for anaphylaxis. Patient not taking: Reported on 07/15/2023    [provider]  flunisolide (NASALIDE) 25 MCG/ACT (0.025%) SOLN Place 2 sprays into the nose daily as needed (allergies). Patient not taking: Reported on 07/08/2023 09/17/22   [provider]  metoprolol succinate (TOPROL-XL) 25 MG 24 hr tablet Take 1 tablet (25 mg total) by mouth daily. Please make overdue appt with Dr. Elease Hashimoto before anymore refills. Thank you 2nd attempt Patient not taking: Reported on 07/15/2023 02/19/22   [name], [name], MD  nitroGLYCERIN (NITROSTAT) 0.4 MG SL tablet Place 1 tablet (0.4 mg total) under the tongue every 5 (five) minutes x 3 doses as needed for chest pain. Patient not taking: Reported on 07/08/2023 06/27/20   [name], [name], MD  prasugrel (EFFIENT) 10 MG TABS tablet Take 1 tablet (10 mg total) by mouth daily. Patient not taking: Reported on 07/08/2023 07/07/23   Beatrice Lecher, PA-C    Current Outpatient Medications  Medication Sig Dispense Refill   acetaminophen (TYLENOL) 500 MG tablet Take 1 tablet (500 mg total) by mouth every 6 (six) hours as  needed. 30 tablet 0   aspirin EC 81 MG tablet Take 81 mg by mouth daily. Swallow whole.     benzonatate (TESSALON) 100 MG capsule Take 1 capsule (100 mg total) by mouth every 8 (eight) hours. 21 capsule 0   bictegravir-emtricitabine-tenofovir AF (BIKTARVY) 50-200-25 MG TABS tablet Take 1 tablet by mouth daily. 30 tablet 6   cetirizine (ZYRTEC) 10 MG tablet TAKE 1 TABLET(10 MG) BY MOUTH DAILY 90 tablet 0   rosuvastatin (CRESTOR) 20 MG tablet Take 1 tablet (20 mg total) by mouth daily. 90 tablet 3   albuterol (VENTOLIN HFA) 108 (90 Base) MCG/ACT inhaler Inhale 2 puffs into the lungs every 6 (six) hours as needed for wheezing or shortness of breath. (Patient not taking: Reported on 07/08/2023) 8 g 2   EPINEPHrine 0.3 mg/0.3 mL IJ SOAJ injection Inject 0.3 mg into the muscle as needed for anaphylaxis. (Patient not taking: Reported on 07/15/2023)     flunisolide (NASALIDE) 25 MCG/ACT (0.025%) SOLN Place 2 sprays into the nose daily as needed (allergies). (Patient not taking: Reported on 07/08/2023)     metoprolol succinate (TOPROL-XL) 25 MG 24 hr tablet Take 1  tablet (25 mg total) by mouth daily. Please make overdue appt with Dr. Elease Hashimoto before anymore refills. Thank you 2nd attempt (Patient not taking: Reported on 07/15/2023) 15 tablet 0   nitroGLYCERIN (NITROSTAT) 0.4 MG SL tablet Place 1 tablet (0.4 mg total) under the tongue every 5 (five) minutes x 3 doses as needed for chest pain. (Patient not taking: Reported on 07/08/2023) 25 tablet 0   prasugrel (EFFIENT) 10 MG TABS tablet Take 1 tablet (10 mg total) by mouth daily. (Patient not taking: Reported on 07/08/2023) 30 tablet 11   Current Facility-Administered Medications  Medication Dose Route Frequency Provider Last Rate Last Admin   0.9 %  sodium chloride infusion  500 mL Intravenous Continuous Jullia Mulligan, Carie Caddy, MD        Allergies as of 07/15/2023   (No Known Allergies)    Family History  Problem Relation Age of Onset   Diabetes Mother    Eczema  Mother    Asthma Mother    COPD Mother    Hypertension Mother    Hyperlipidemia Mother    Stroke Mother    Heart disease Mother    Sleep apnea Mother    Liver cancer Father    Asthma Brother    Diabetes Maternal Grandmother    Heart failure Maternal Grandmother    Colon cancer Neg Hx    Rectal cancer Neg Hx    Stomach cancer Neg Hx     Social History   Socioeconomic History   Marital status: Single    Spouse name: Not on file   Number of children: 0   Years of education: Not on file   Highest education level: Not on file  Occupational History   Occupation: unemployed  Tobacco Use   Smoking status: Former    Current packs/day: 0.00    Average packs/day: 0.3 packs/day for 17.0 years (4.3 ttl pk-yrs)    Types: Cigarettes, Cigars    Start date: 03/22/1998    Quit date: 03/23/2015    Years since quitting: 8.3   Smokeless tobacco: Never  Vaping Use   Vaping status: Never Used  Substance and Sexual Activity   Alcohol use: Yes    Comment: occasional   Drug use: Yes    Frequency: 7.0 times per week    Types: Marijuana    Comment: used yesterday at 1400   Sexual activity: Not Currently    Partners: Male    Comment: accepted condoms  Other Topics Concern   Not on file  Social History Narrative   Not on file   Social Determinants of Health   Financial Resource Strain: Low Risk  (09/18/2022)   Overall Financial Resource Strain (CARDIA)    Difficulty of Paying Living Expenses: Not hard at all  Food Insecurity: No Food Insecurity (07/08/2023)   Hunger Vital Sign    Worried About Running Out of Food in the Last Year: Never true    Ran Out of Food in the Last Year: Never true  Transportation Needs: No Transportation Needs (07/08/2023)   PRAPARE - Administrator, Civil Service (Medical): No    Lack of Transportation (Non-Medical): No  Physical Activity: Insufficiently Active (09/18/2022)   Exercise Vital Sign    Days of Exercise per Week: 3 days    Minutes of  Exercise per Session: 30 min  Stress: No Stress Concern Present (09/18/2022)   [employer] of Occupational Health - Occupational Stress Questionnaire    Feeling of Stress : Not at  all  Social Connections: Moderately Isolated (09/18/2022)   Social Connection and Isolation Panel [NHANES]    Frequency of Communication with Friends and Family: More than three times a week    Frequency of Social Gatherings with Friends and Family: More than three times a week    Attends Religious Services: More than 4 times per year    Active Member of [employer] or Organizations: No    Attends Banker Meetings: Never    Marital Status: Never married  Intimate Partner Violence: Not At Risk (09/18/2022)   Humiliation, Afraid, Rape, and Kick questionnaire    Fear of Current or Ex-Partner: No    Emotionally Abused: No    Physically Abused: No    Sexually Abused: No    Physical Exam: Vital signs in last 24 hours: @BP  117/74   Pulse 88   Temp (!) 96.6 F (35.9 C)   Ht 5\' 5"  (1.651 m)   Wt 134 lb (60.8 kg)   SpO2 96%   BMI 22.30 kg/m  GEN: NAD EYE: Sclerae anicteric ENT: MMM CV: Non-tachycardic Pulm: CTA b/l GI: Soft, NT/ND NEURO:  Alert & Oriented x 3   Erick Blinks, MD [facility]  07/15/2023 2:43 PM

## 2023-07-15 NOTE — Progress Notes (Signed)
Called to room to assist during endoscopic procedure.  Patient ID and intended procedure confirmed with present staff. Received instructions for my participation in the procedure from the performing physician.  

## 2023-07-15 NOTE — Op Note (Signed)
[facility] Patient Name: Rodney Walker Procedure Date: 07/15/2023 2:46 PM MRN: [id number] Endoscopist: Beverley Fiedler , MD, [id number] Age: 42 Referring MD:  Date of Birth: [birth year] Gender: Male Account #: [id number] Procedure:                Colonoscopy Indications:              Rectal bleeding Medicines:                Monitored Anesthesia Care Procedure:                Pre-Anesthesia Assessment:                           - Prior to the procedure, a History and Physical                            was performed, and patient medications and                            allergies were reviewed. The patient's tolerance of                            previous anesthesia was also reviewed. The risks                            and benefits of the procedure and the sedation                            options and risks were discussed with the patient.                            All questions were answered, and informed consent                            was obtained. Prior Anticoagulants: The patient has                            taken Brilinta (ticagrelor), last dose was 7 days                            prior to procedure. ASA Grade Assessment: III - A                            patient with severe systemic disease. After                            reviewing the risks and benefits, the patient was                            deemed in satisfactory condition to undergo the                            procedure.  After obtaining informed consent, the colonoscope                            was passed under direct vision. Throughout the                            procedure, the patient's blood pressure, pulse, and                            oxygen saturations were monitored continuously. The                            Olympus CF-HQ190L ([id number]) Colonoscope was                            introduced through the anus and advanced to the                             cecum, identified by appendiceal orifice and                            ileocecal valve. The colonoscopy was performed                            without difficulty. The patient tolerated the                            procedure well. The quality of the bowel                            preparation was good. The ileocecal valve,                            appendiceal orifice, and rectum were photographed. Scope In: 2:51:07 PM Scope Out: 3:05:50 PM Scope Withdrawal Time: 0 hours 10 minutes 52 seconds  Total Procedure Duration: 0 hours 14 minutes 43 seconds  Findings:                 The digital rectal exam was normal.                           Two sessile polyps were found in the sigmoid colon.                            The polyps were 3 to 4 mm in size. These polyps                            were removed with a cold snare. Resection and                            retrieval were complete.                           Internal hemorrhoids were found during  retroflexion. The hemorrhoids were medium-sized.                           The exam was otherwise without abnormality. Complications:            No immediate complications. Estimated Blood Loss:     Estimated blood loss: none. Impression:               - Two 3 to 4 mm polyps in the sigmoid colon,                            removed with a cold snare. Resected and retrieved.                           - Internal hemorrhoids.                           - The examination was otherwise normal. Recommendation:           - Patient has a contact number available for                            emergencies. The signs and symptoms of potential                            delayed complications were discussed with the                            patient. Return to normal activities tomorrow.                            Written discharge instructions were provided to the                            patient.                            - Resume previous diet.                           - Continue present medications.                           - Resume Brilinta (ticagrelor) at prior dose                            tomorrow. Refer to managing physician for further                            adjustment of therapy.                           - Await pathology results.                           - Repeat colonoscopy is recommended. The  colonoscopy date will be determined after pathology                            results from today's exam become available for                            review. Beverley Fiedler, MD 07/15/2023 3:19:09 PM This report has been signed electronically.

## 2023-07-15 NOTE — Patient Instructions (Signed)
Handout on polyps and hemorrhoids given to patient Await pathology results Resume previous diet and continue present medications - resume Brilinta (tricagrelor) at prior dose tomorrow 07/16/2023 Repeat colonoscopy for surveillance will be determined based off of pathology results    YOU HAD AN ENDOSCOPIC PROCEDURE TODAY AT THE  ENDOSCOPY CENTER:   Refer to the procedure report that was given to you for any specific questions about what was found during the examination.  If the procedure report does not answer your questions, please call your gastroenterologist to clarify.  If you requested that your care partner not be given the details of your procedure findings, then the procedure report has been included in a sealed envelope for you to review at your convenience later.  YOU SHOULD EXPECT: Some feelings of bloating in the abdomen. Passage of more gas than usual.  Walking can help get rid of the air that was put into your GI tract during the procedure and reduce the bloating. If you had a lower endoscopy (such as a colonoscopy or flexible sigmoidoscopy) you may notice spotting of blood in your stool or on the toilet paper. If you underwent a bowel prep for your procedure, you may not have a normal bowel movement for a few days.  Please Note:  You might notice some irritation and congestion in your nose or some drainage.  This is from the oxygen used during your procedure.  There is no need for concern and it should clear up in a day or so.  SYMPTOMS TO REPORT IMMEDIATELY:  Following lower endoscopy (colonoscopy or flexible sigmoidoscopy):  Excessive amounts of blood in the stool  Significant tenderness or worsening of abdominal pains  Swelling of the abdomen that is new, acute  Fever of 100F or higher  For urgent or emergent issues, a gastroenterologist can be reached at any hour by calling [phone]. Do not use MyChart messaging for urgent concerns.    DIET:  We do recommend a  small meal at first, but then you may proceed to your regular diet.  Drink plenty of fluids but you should avoid alcoholic beverages for 24 hours.  ACTIVITY:  You should plan to take it easy for the rest of today and you should NOT DRIVE or use heavy machinery until tomorrow (because of the sedation medicines used during the test).    FOLLOW UP: Our staff will call the number listed on your records the next business day following your procedure.  We will call around 7:15- 8:00 am to check on you and address any questions or concerns that you may have regarding the information given to you following your procedure. If we do not reach you, we will leave a message.     If any biopsies were taken you will be contacted by phone or by letter within the next 1-3 weeks.  Please call us at [phone] if you have not heard about the biopsies in 3 weeks.    SIGNATURES/CONFIDENTIALITY: You and/or your care partner have signed paperwork which will be entered into your electronic medical record.  These signatures attest to the fact that that the information above on your After Visit Summary has been reviewed and is understood.  Full responsibility of the confidentiality of this discharge information lies with you and/or your care-partner.

## 2023-07-15 NOTE — Progress Notes (Signed)
Report given to PACU, vss 

## 2023-07-16 ENCOUNTER — Telehealth: Payer: Self-pay | Admitting: *Deleted

## 2023-07-16 NOTE — Telephone Encounter (Signed)
  Follow up Call-     07/15/2023    2:18 PM  Call back number  Post procedure Call Back phone  # [phone]  Permission to leave phone message Yes     Patient questions:  Do you have a fever, pain , or abdominal swelling? No. Pain Score  0 *  Have you tolerated food without any problems? Yes.    Have you been able to return to your normal activities? Yes.    Do you have any questions about your discharge instructions: Diet   No. Medications  No. Follow up visit  No.  Do you have questions or concerns about your Care? No.  Actions: * If pain score is 4 or above: No action needed, pain <4.

## 2023-07-19 ENCOUNTER — Inpatient Hospital Stay: Payer: 59 | Admitting: Emergency Medicine

## 2023-07-20 ENCOUNTER — Encounter: Payer: 59 | Admitting: Internal Medicine

## 2023-07-21 ENCOUNTER — Encounter: Payer: Self-pay | Admitting: Internal Medicine

## 2023-07-23 ENCOUNTER — Encounter: Payer: 59 | Admitting: Internal Medicine

## 2023-07-26 ENCOUNTER — Ambulatory Visit: Payer: 59 | Admitting: Family

## 2023-07-27 ENCOUNTER — Ambulatory Visit: Payer: 59 | Admitting: Emergency Medicine

## 2023-07-29 ENCOUNTER — Encounter: Payer: Self-pay | Admitting: Emergency Medicine

## 2023-07-29 ENCOUNTER — Ambulatory Visit (INDEPENDENT_AMBULATORY_CARE_PROVIDER_SITE_OTHER): Payer: 59 | Admitting: Emergency Medicine

## 2023-07-29 VITALS — BP 120/76 | HR 94 | Temp 98.1°F | Ht 65.0 in | Wt 127.2 lb

## 2023-07-29 DIAGNOSIS — Z130 Encounter for screening for diseases of the blood and blood-forming organs and certain disorders involving the immune mechanism: Secondary | ICD-10-CM | POA: Diagnosis not present

## 2023-07-29 DIAGNOSIS — E785 Hyperlipidemia, unspecified: Secondary | ICD-10-CM

## 2023-07-29 DIAGNOSIS — Z0001 Encounter for general adult medical examination with abnormal findings: Secondary | ICD-10-CM | POA: Diagnosis not present

## 2023-07-29 DIAGNOSIS — E7800 Pure hypercholesterolemia, unspecified: Secondary | ICD-10-CM

## 2023-07-29 DIAGNOSIS — F1210 Cannabis abuse, uncomplicated: Secondary | ICD-10-CM | POA: Diagnosis not present

## 2023-07-29 DIAGNOSIS — I255 Ischemic cardiomyopathy: Secondary | ICD-10-CM | POA: Diagnosis not present

## 2023-07-29 DIAGNOSIS — Z125 Encounter for screening for malignant neoplasm of prostate: Secondary | ICD-10-CM | POA: Diagnosis not present

## 2023-07-29 DIAGNOSIS — I252 Old myocardial infarction: Secondary | ICD-10-CM | POA: Diagnosis not present

## 2023-07-29 DIAGNOSIS — J439 Emphysema, unspecified: Secondary | ICD-10-CM

## 2023-07-29 DIAGNOSIS — Z13220 Encounter for screening for lipoid disorders: Secondary | ICD-10-CM

## 2023-07-29 DIAGNOSIS — Z21 Asymptomatic human immunodeficiency virus [HIV] infection status: Secondary | ICD-10-CM

## 2023-07-29 LAB — CBC WITH DIFFERENTIAL/PLATELET
Basophils Absolute: 0 10*3/uL (ref 0.0–0.1)
Basophils Relative: 0.4 % (ref 0.0–3.0)
Eosinophils Absolute: 0.2 10*3/uL (ref 0.0–0.7)
Eosinophils Relative: 1.9 % (ref 0.0–5.0)
HCT: 44.9 % (ref 39.0–52.0)
Hemoglobin: 15.5 g/dL (ref 13.0–17.0)
Lymphocytes Relative: 50.4 % — ABNORMAL HIGH (ref 12.0–46.0)
Lymphs Abs: 4.6 10*3/uL — ABNORMAL HIGH (ref 0.7–4.0)
MCHC: 34.5 g/dL (ref 30.0–36.0)
MCV: 95.3 fl (ref 78.0–100.0)
Monocytes Absolute: 0.8 10*3/uL (ref 0.1–1.0)
Monocytes Relative: 9.1 % (ref 3.0–12.0)
Neutro Abs: 3.5 10*3/uL (ref 1.4–7.7)
Neutrophils Relative %: 38.2 % — ABNORMAL LOW (ref 43.0–77.0)
Platelets: 264 10*3/uL (ref 150.0–400.0)
RBC: 4.71 Mil/uL (ref 4.22–5.81)
RDW: 13.8 % (ref 11.5–15.5)
WBC: 9.1 10*3/uL (ref 4.0–10.5)

## 2023-07-29 LAB — COMPREHENSIVE METABOLIC PANEL
ALT: 14 U/L (ref 0–53)
AST: 17 U/L (ref 0–37)
Albumin: 4.5 g/dL (ref 3.5–5.2)
Alkaline Phosphatase: 90 U/L (ref 39–117)
BUN: 18 mg/dL (ref 6–23)
CO2: 29 mEq/L (ref 19–32)
Calcium: 9.6 mg/dL (ref 8.4–10.5)
Chloride: 101 mEq/L (ref 96–112)
Creatinine, Ser: 1.2 mg/dL (ref 0.40–1.50)
GFR: 74.45 mL/min (ref >60.00)
Glucose, Bld: 100 mg/dL — ABNORMAL HIGH (ref 70–99)
Potassium: 3.8 mEq/L (ref 3.5–5.1)
Sodium: 136 mEq/L (ref 135–145)
Total Bilirubin: 0.5 mg/dL (ref 0.2–1.2)
Total Protein: 7.5 g/dL (ref 6.0–8.3)

## 2023-07-29 LAB — LIPID PANEL
Cholesterol: 152 mg/dL (ref 0–200)
HDL: 57.1 mg/dL (ref >39.00)
LDL Cholesterol: 85 mg/dL (ref 0–99)
NonHDL: 95.16
Total CHOL/HDL Ratio: 3
Triglycerides: 51 mg/dL (ref 0.0–149.0)
VLDL: 10.2 mg/dL (ref 0.0–40.0)

## 2023-07-29 LAB — PSA: PSA: 0.85 ng/mL (ref 0.10–4.00)

## 2023-07-29 LAB — HEMOGLOBIN A1C: Hgb A1c MFr Bld: 5.7 % (ref 4.6–6.5)

## 2023-07-29 NOTE — Assessment & Plan Note (Signed)
No recent chest pain episodes No need for nitroglycerin Not taking metoprolol succinate Continues daily baby aspirin

## 2023-07-29 NOTE — Assessment & Plan Note (Signed)
Clinically stable Continue daily baby aspirin

## 2023-07-29 NOTE — Patient Instructions (Signed)
Health Maintenance, Male Adopting a healthy lifestyle and getting preventive care are important in promoting health and wellness. Ask your health care provider about: The right schedule for you to have regular tests and exams. Things you can do on your own to prevent diseases and keep yourself healthy. What should I know about diet, weight, and exercise? Eat a healthy diet  Eat a diet that includes plenty of vegetables, fruits, low-fat dairy products, and lean protein. Do not eat a lot of foods that are high in solid fats, added sugars, or sodium. Maintain a healthy weight Body mass index (BMI) is a measurement that can be used to identify possible weight problems. It estimates body fat based on height and weight. Your health care provider can help determine your BMI and help you achieve or maintain a healthy weight. Get regular exercise Get regular exercise. This is one of the most important things you can do for your health. Most adults should: Exercise for at least 150 minutes each week. The exercise should increase your heart rate and make you sweat (moderate-intensity exercise). Do strengthening exercises at least twice a week. This is in addition to the moderate-intensity exercise. Spend less time sitting. Even light physical activity can be beneficial. Watch cholesterol and blood lipids Have your blood tested for lipids and cholesterol at 43 years of age, then have this test every 5 years. You may need to have your cholesterol levels checked more often if: Your lipid or cholesterol levels are high. You are older than 43 years of age. You are at high risk for heart disease. What should I know about cancer screening? Many types of cancers can be detected early and may often be prevented. Depending on your health history and family history, you may need to have cancer screening at various ages. This may include screening for: Colorectal cancer. Prostate cancer. Skin cancer. Lung  cancer. What should I know about heart disease, diabetes, and high blood pressure? Blood pressure and heart disease High blood pressure causes heart disease and increases the risk of stroke. This is more likely to develop in people who have high blood pressure readings or are overweight. Talk with your health care provider about your target blood pressure readings. Have your blood pressure checked: Every 3-5 years if you are 18-39 years of age. Every year if you are 40 years old or older. If you are between the ages of 65 and 75 and are a current or former smoker, ask your health care provider if you should have a one-time screening for abdominal aortic aneurysm (AAA). Diabetes Have regular diabetes screenings. This checks your fasting blood sugar level. Have the screening done: Once every three years after age 45 if you are at a normal weight and have a low risk for diabetes. More often and at a younger age if you are overweight or have a high risk for diabetes. What should I know about preventing infection? Hepatitis B If you have a higher risk for hepatitis B, you should be screened for this virus. Talk with your health care provider to find out if you are at risk for hepatitis B infection. Hepatitis C Blood testing is recommended for: Everyone born from 1945 through 1965. Anyone with known risk factors for hepatitis C. Sexually transmitted infections (STIs) You should be screened each year for STIs, including gonorrhea and chlamydia, if: You are sexually active and are younger than 43 years of age. You are older than 43 years of age and your   health care provider tells you that you are at risk for this type of infection. Your sexual activity has changed since you were last screened, and you are at increased risk for chlamydia or gonorrhea. Ask your health care provider if you are at risk. Ask your health care provider about whether you are at high risk for HIV. Your health care provider  may recommend a prescription medicine to help prevent HIV infection. If you choose to take medicine to prevent HIV, you should first get tested for HIV. You should then be tested every 3 months for as long as you are taking the medicine. Follow these instructions at home: Alcohol use Do not drink alcohol if your health care provider tells you not to drink. If you drink alcohol: Limit how much you have to 0-2 drinks a day. Know how much alcohol is in your drink. In the [place] one drink equals one 12 oz bottle of beer (355 mL), one 5 oz glass of wine (148 mL), or one 1 oz glass of hard liquor (44 mL). Lifestyle Do not use any products that contain nicotine or tobacco. These products include cigarettes, chewing tobacco, and vaping devices, such as e-cigarettes. If you need help quitting, ask your health care provider. Do not use street drugs. Do not share needles. Ask your health care provider for help if you need support or information about quitting drugs. General instructions Schedule regular health, dental, and eye exams. Stay current with your vaccines. Tell your health care provider if: You often feel depressed. You have ever been abused or do not feel safe at home. Summary Adopting a healthy lifestyle and getting preventive care are important in promoting health and wellness. Follow your health care provider's instructions about healthy diet, exercising, and getting tested or screened for diseases. Follow your health care provider's instructions on monitoring your cholesterol and blood pressure. This information is not intended to replace advice given to you by your health care provider. Make sure you discuss any questions you have with your health care provider. Document Revised: 05/05/2021 Document Reviewed: 05/05/2021 [employer] Patient Education  2024 [employer]  

## 2023-07-29 NOTE — Progress Notes (Signed)
Rodney Walker 42 y.o.   Chief Complaint  Patient presents with   Annual Exam    Patient has a knot on his left nostril     HISTORY OF PRESENT ILLNESS: This is a 42 y.o. male here for annual exam. No changes on chronic medical conditions No complaints or medical concerns today.  HPI   Prior to Admission medications   Medication Sig Start Date End Date Taking? Authorizing Provider  acetaminophen (TYLENOL) 500 MG tablet Take 1 tablet (500 mg total) by mouth every 6 (six) hours as needed. 07/04/23  Yes Fayrene Helper, PA-C  aspirin EC 81 MG tablet Take 81 mg by mouth daily. Swallow whole.   Yes [provider]  bictegravir-emtricitabine-tenofovir AF (BIKTARVY) 50-200-25 MG TABS tablet Take 1 tablet by mouth daily. 03/25/23  Yes Veryl Speak, FNP  cetirizine (ZYRTEC) 10 MG tablet TAKE 1 TABLET(10 MG) BY MOUTH DAILY 06/16/23  Yes Veryl Speak, FNP  prasugrel (EFFIENT) 10 MG TABS tablet Take 1 tablet (10 mg total) by mouth daily. 07/07/23  Yes Weaver, Scott T, PA-C  rosuvastatin (CRESTOR) 20 MG tablet Take 1 tablet (20 mg total) by mouth daily. 06/14/23 09/12/23 Yes Weaver, Scott T, PA-C  albuterol (VENTOLIN HFA) 108 (90 Base) MCG/ACT inhaler Inhale 2 puffs into the lungs every 6 (six) hours as needed for wheezing or shortness of breath. Patient not taking: Reported on 07/08/2023 11/24/22   Veryl Speak, FNP  EPINEPHrine 0.3 mg/0.3 mL IJ SOAJ injection Inject 0.3 mg into the muscle as needed for anaphylaxis. Patient not taking: Reported on 07/15/2023    [provider]  flunisolide (NASALIDE) 25 MCG/ACT (0.025%) SOLN Place 2 sprays into the nose daily as needed (allergies). Patient not taking: Reported on 07/08/2023 09/17/22   [provider]  metoprolol succinate (TOPROL-XL) 25 MG 24 hr tablet Take 1 tablet (25 mg total) by mouth daily. Please make overdue appt with Dr. Elease Hashimoto before anymore refills. Thank you 2nd attempt Patient not taking: Reported  on 07/15/2023 02/19/22   [name], [name], MD  nitroGLYCERIN (NITROSTAT) 0.4 MG SL tablet Place 1 tablet (0.4 mg total) under the tongue every 5 (five) minutes x 3 doses as needed for chest pain. 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   Hyperlipidemia 04/05/2018   Ischemic cardiomyopathy 04/05/2018   History of acute inferior wall MI 03/24/2018   Marijuana abuse, continuous 10/29/2016   Anemia 05/03/2015   Pulmonary emphysema (HCC)    HIV (human immunodeficiency virus infection) (HCC)    Tobacco abuse 03/21/2015   Pulmonary nodule 08/31/2014   AIDS (HCC) 03/22/2014   CAD (coronary artery disease) 11/13/2011    Past Medical History:  Diagnosis Date   Anemia    COPD (chronic obstructive pulmonary disease) (HCC)    Coronary artery disease    MI 2010 - dissection/plaque rupture in LAD tx with antiplt/antithrombotics >> relook cath with resolution (no PCI performed) // Inf STEMI 3/19: OM1 30; pRCA 100 >> DES // Echo 3/19:  mild LVH, EF 40-45, inf/inf-sept HK, trivial MR   Eczema    GERD (gastroesophageal reflux disease)    History of blood clots    History of myocardial infarction 2010; 2019   History of pleural empyema    HIV infection (HCC)  Hyperlipidemia    Hypertension    Immune deficiency disorder (HCC)    Ischemic cardiomyopathy 04/05/2018   Moderate persistent asthma without complication 10/29/2016   Myocardial infarction [facility])    2009, 2019   Pneumonia     Past Surgical History:  Procedure Laterality Date   CARDIAC CATHETERIZATION  06/24/2009   EF 60%   CARDIAC CATHETERIZATION  06/17/2009   EF 45%. ANTERIOR HYPOKINESIS   CORONARY STENT INTERVENTION N/A 03/24/2018   Procedure: CORONARY STENT INTERVENTION;  Surgeon: Tonny Bollman, MD;  Location: [facility] INVASIVE CV LAB;  Service:  Cardiovascular;  Laterality: N/A;   CORONARY/GRAFT ACUTE MI REVASCULARIZATION N/A 03/24/2018   Procedure: Coronary/Graft Acute MI Revascularization;  Surgeon: Tonny Bollman, MD;  Location: [facility] INVASIVE CV LAB;  Service: Cardiovascular;  Laterality: N/A;   LEFT HEART CATH AND CORONARY ANGIOGRAPHY N/A 03/24/2018   Procedure: LEFT HEART CATH AND CORONARY ANGIOGRAPHY;  Surgeon: Tonny Bollman, MD;  Location: [facility] INVASIVE CV LAB;  Service: Cardiovascular;  Laterality: N/A;   SINUS IRRIGATION  10/08/2022   Procedure: BRONCHIAL LAVAGE;  Surgeon: Lupita Leash, MD;  Location: [facility] ENDOSCOPY;  Service: Cardiopulmonary;;   TOTAL HIP ARTHROPLASTY Right 09/25/2019   Procedure: RIGHT TOTAL HIP ARTHROPLASTY ANTERIOR APPROACH;  Surgeon: Tarry Kos, MD;  Location: [facility] OR;  Service: Orthopedics;  Laterality: Right;   US ECHOCARDIOGRAPHY  12/09/2009   EF 55-60%   VIDEO ASSISTED THORACOSCOPY (VATS)/ LOBECTOMY Right 04/23/2015   Procedure: VIDEO ASSISTED THORACOSCOPY (VATS)/RIGHT upper  and right middle LOBECTOMY;  Surgeon: Kerin Perna, MD;  Location: [facility] OR;  Service: Thoracic;  Laterality: Right;   VIDEO BRONCHOSCOPY Bilateral 09/05/2014   Procedure: VIDEO BRONCHOSCOPY WITH FLUORO;  Surgeon: Merwyn Katos, MD;  Location: [facility] ENDOSCOPY;  Service: Cardiopulmonary;  Laterality: Bilateral;   VIDEO BRONCHOSCOPY Bilateral 04/10/2015   Procedure: VIDEO BRONCHOSCOPY WITH FLUORO;  Surgeon: Lupita Leash, MD;  Location: [facility] ENDOSCOPY;  Service: Cardiopulmonary;  Laterality: Bilateral;   VIDEO BRONCHOSCOPY N/A 10/08/2022   Procedure: VIDEO BRONCHOSCOPY WITHOUT FLUORO;  Surgeon: Lupita Leash, MD;  Location: [facility] ENDOSCOPY;  Service: Cardiopulmonary;  Laterality: N/A;    Social History   Socioeconomic History   Marital status: Single    Spouse name: Not on file   Number of children: 0   Years of education: Not on file   Highest education level: Not on file  Occupational History   Occupation: unemployed   Tobacco Use   Smoking status: Former    Current packs/day: 0.00    Average packs/day: 0.3 packs/day for 17.0 years (4.3 ttl pk-yrs)    Types: Cigarettes, Cigars    Start date: 03/22/1998    Quit date: 03/23/2015    Years since quitting: 8.3   Smokeless tobacco: Never  Vaping Use   Vaping status: Never Used  Substance and Sexual Activity   Alcohol use: Yes    Comment: occasional   Drug use: Yes    Frequency: 7.0 times per week    Types: Marijuana    Comment: used yesterday at 1400   Sexual activity: Not Currently    Partners: Male    Comment: accepted condoms  Other Topics Concern   Not on file  Social History Narrative   Not on file   Social Determinants of Health   Financial Resource Strain: Low Risk  (09/18/2022)   Overall Financial Resource Strain (CARDIA)    Difficulty of Paying Living Expenses: Not hard at all  Food Insecurity: No Food Insecurity (07/08/2023)   Hunger  Vital Sign    Worried About Programme researcher, broadcasting/film/video in the Last Year: Never true    Ran Out of Food in the Last Year: Never true  Transportation Needs: No Transportation Needs (07/08/2023)   PRAPARE - Administrator, Civil Service (Medical): No    Lack of Transportation (Non-Medical): No  Physical Activity: Insufficiently Active (09/18/2022)   Exercise Vital Sign    Days of Exercise per Week: 3 days    Minutes of Exercise per Session: 30 min  Stress: No Stress Concern Present (09/18/2022)   [employer] of Occupational Health - Occupational Stress Questionnaire    Feeling of Stress : Not at all  Social Connections: Moderately Isolated (09/18/2022)   Social Connection and Isolation Panel [NHANES]    Frequency of Communication with Friends and Family: More than three times a week    Frequency of Social Gatherings with Friends and Family: More than three times a week    Attends Religious Services: More than 4 times per year    Active Member of [employer] or Organizations: No    Attends Tax inspector Meetings: Never    Marital Status: Never married  Intimate Partner Violence: Not At Risk (09/18/2022)   Humiliation, Afraid, Rape, and Kick questionnaire    Fear of Current or Ex-Partner: No    Emotionally Abused: No    Physically Abused: No    Sexually Abused: No    Family History  Problem Relation Age of Onset   Diabetes Mother    Eczema Mother    Asthma Mother    COPD Mother    Hypertension Mother    Hyperlipidemia Mother    Stroke Mother    Heart disease Mother    Sleep apnea Mother    Liver cancer Father    Asthma Brother    Diabetes Maternal Grandmother    Heart failure Maternal Grandmother    Colon cancer Neg Hx    Rectal cancer Neg Hx    Stomach cancer Neg Hx      Review of Systems  Constitutional: Negative.  Negative for chills and fever.  HENT: Negative.  Negative for congestion and sore throat.   Respiratory: Negative.  Negative for cough and shortness of breath.   Cardiovascular: Negative.  Negative for chest pain and palpitations.  Gastrointestinal:  Negative for abdominal pain, diarrhea, nausea and vomiting.  Genitourinary: Negative.  Negative for dysuria and hematuria.  Skin:  Negative for rash.  Neurological: Negative.  Negative for dizziness and headaches.  All other systems reviewed and are negative.   Vitals:   07/29/23 1435  BP: 120/76  Pulse: 94  Temp: 98.1 F (36.7 C)  SpO2: 95%    Physical Exam Vitals reviewed.  Constitutional:      Appearance: Normal appearance.  HENT:     Head: Normocephalic.     Right Ear: Tympanic membrane, ear canal and external ear normal.     Left Ear: Tympanic membrane, ear canal and external ear normal.     Mouth/Throat:     Mouth: Mucous membranes are moist.     Pharynx: Oropharynx is clear.  Eyes:     Extraocular Movements: Extraocular movements intact.     Conjunctiva/sclera: Conjunctivae normal.     Pupils: Pupils are equal, round, and reactive to light.  Cardiovascular:     Rate and  Rhythm: Normal rate and regular rhythm.     Pulses: Normal pulses.     Heart sounds: Normal heart sounds.  Pulmonary:     Effort: Pulmonary effort is normal.     Breath sounds: Normal breath sounds.  Abdominal:     Palpations: Abdomen is soft.     Tenderness: There is no abdominal tenderness.  Musculoskeletal:     Cervical back: No tenderness.     Right lower leg: No edema.     Left lower leg: No edema.  Lymphadenopathy:     Cervical: No cervical adenopathy.  Skin:    General: Skin is warm and dry.     Capillary Refill: Capillary refill takes less than 2 seconds.     Comments: Small sebaceous cyst to left side of nose  Neurological:     General: No focal deficit present.     Mental Status: He is alert and oriented to person, place, and time.  Psychiatric:        Mood and Affect: Mood normal.        Behavior: Behavior normal.      ASSESSMENT & PLAN: Problem List Items Addressed This Visit       Cardiovascular and Mediastinum   Ischemic cardiomyopathy    Clinically stable Continue daily baby aspirin        Respiratory   Pulmonary emphysema (HCC)    Clinically stable.  No recent need for albuterol inhaler        Other   HIV (human immunodeficiency virus infection) (HCC)    Clinically stable. Continues Biktarvy 1 tablet daily      Marijuana abuse, continuous    Cardiovascular and cancer risks associated with smoking discussed Smoking cessation advice given      History of acute inferior wall MI    No recent chest pain episodes No need for nitroglycerin Not taking metoprolol succinate Continues daily baby aspirin      Hyperlipidemia    Chronic stable condition Continue rosuvastatin 20 mg daily       Other Visit Diagnoses     Encounter for general adult medical examination with abnormal findings    -  Primary   Relevant Orders   CBC with Differential   Comprehensive metabolic panel   Hemoglobin A1c   Lipid panel   PSA(Must document that pt has  been informed of limitations of PSA testing.)   Screening for prostate cancer       Relevant Orders   PSA(Must document that pt has been informed of limitations of PSA testing.)   Screening for deficiency anemia       Relevant Orders   CBC with Differential   Screening for lipoid disorders       Relevant Orders   Lipid panel   Screening for endocrine, metabolic and immunity disorder       Relevant Orders   Comprehensive metabolic panel   Hemoglobin A1c        Modifiable risk factors discussed with patient. Anticipatory guidance according to age provided. The following topics were also discussed: Social Determinants of Health Smoking.  Smokes marijuana.  Cardiovascular and cancer risk associated with smoking discussed.  Smoking cessation advice given Diet and nutrition Benefits of exercise Cancer family history review Vaccinations reviewed and recommendations Cardiovascular risk assessment Mental health including depression and anxiety Fall and accident prevention  Patient Instructions  Health Maintenance, Male Adopting a healthy lifestyle and getting preventive care are important in promoting health and wellness. Ask your health care provider about: The right schedule for you to have regular tests and exams. Things you can do on your own to  prevent diseases and keep yourself healthy. What should I know about diet, weight, and exercise? Eat a healthy diet  Eat a diet that includes plenty of vegetables, fruits, low-fat dairy products, and lean protein. Do not eat a lot of foods that are high in solid fats, added sugars, or sodium. Maintain a healthy weight Body mass index (BMI) is a measurement that can be used to identify possible weight problems. It estimates body fat based on height and weight. Your health care provider can help determine your BMI and help you achieve or maintain a healthy weight. Get regular exercise Get regular exercise. This is one of the most  important things you can do for your health. Most adults should: Exercise for at least 150 minutes each week. The exercise should increase your heart rate and make you sweat (moderate-intensity exercise). Do strengthening exercises at least twice a week. This is in addition to the moderate-intensity exercise. Spend less time sitting. Even light physical activity can be beneficial. Watch cholesterol and blood lipids Have your blood tested for lipids and cholesterol at 43 years of age, then have this test every 5 years. You may need to have your cholesterol levels checked more often if: Your lipid or cholesterol levels are high. You are older than 43 years of age. You are at high risk for heart disease. What should I know about cancer screening? Many types of cancers can be detected early and may often be prevented. Depending on your health history and family history, you may need to have cancer screening at various ages. This may include screening for: Colorectal cancer. Prostate cancer. Skin cancer. Lung cancer. What should I know about heart disease, diabetes, and high blood pressure? Blood pressure and heart disease High blood pressure causes heart disease and increases the risk of stroke. This is more likely to develop in people who have high blood pressure readings or are overweight. Talk with your health care provider about your target blood pressure readings. Have your blood pressure checked: Every 3-5 years if you are 5-5 years of age. Every year if you are 50 years old or older. If you are between the ages of 70 and 69 and are a current or former smoker, ask your health care provider if you should have a one-time screening for abdominal aortic aneurysm (AAA). Diabetes Have regular diabetes screenings. This checks your fasting blood sugar level. Have the screening done: Once every three years after age 34 if you are at a normal weight and have a low risk for diabetes. More often  and at a younger age if you are overweight or have a high risk for diabetes. What should I know about preventing infection? Hepatitis B If you have a higher risk for hepatitis B, you should be screened for this virus. Talk with your health care provider to find out if you are at risk for hepatitis B infection. Hepatitis C Blood testing is recommended for: Everyone born from [birth year] through 1966. Anyone with known risk factors for hepatitis C. Sexually transmitted infections (STIs) You should be screened each year for STIs, including gonorrhea and chlamydia, if: You are sexually active and are younger than 43 years of age. You are older than 43 years of age and your health care provider tells you that you are at risk for this type of infection. Your sexual activity has changed since you were last screened, and you are at increased risk for chlamydia or gonorrhea. Ask your health care provider if you  are at risk. Ask your health care provider about whether you are at high risk for HIV. Your health care provider may recommend a prescription medicine to help prevent HIV infection. If you choose to take medicine to prevent HIV, you should first get tested for HIV. You should then be tested every 3 months for as long as you are taking the medicine. Follow these instructions at home: Alcohol use Do not drink alcohol if your health care provider tells you not to drink. If you drink alcohol: Limit how much you have to 0-2 drinks a day. Know how much alcohol is in your drink. In the [place] one drink equals one 12 oz bottle of beer (355 mL), one 5 oz glass of wine (148 mL), or one 1 oz glass of hard liquor (44 mL). Lifestyle Do not use any products that contain nicotine or tobacco. These products include cigarettes, chewing tobacco, and vaping devices, such as e-cigarettes. If you need help quitting, ask your health care provider. Do not use street drugs. Do not share needles. Ask your health care provider  for help if you need support or information about quitting drugs. General instructions Schedule regular health, dental, and eye exams. Stay current with your vaccines. Tell your health care provider if: You often feel depressed. You have ever been abused or do not feel safe at home. Summary Adopting a healthy lifestyle and getting preventive care are important in promoting health and wellness. Follow your health care provider's instructions about healthy diet, exercising, and getting tested or screened for diseases. Follow your health care provider's instructions on monitoring your cholesterol and blood pressure. This information is not intended to replace advice given to you by your health care provider. Make sure you discuss any questions you have with your health care provider. Document Revised: 05/05/2021 Document Reviewed: 05/05/2021 Elsevier Patient Education  2024 Elsevier Inc.    Edwina Barth, MD [facility]

## 2023-07-29 NOTE — Assessment & Plan Note (Signed)
Clinically stable.  No recent need for albuterol inhaler

## 2023-07-29 NOTE — Assessment & Plan Note (Signed)
Cardiovascular and cancer risks associated with smoking discussed. °Smoking cessation advice given. °

## 2023-07-29 NOTE — Assessment & Plan Note (Signed)
Clinically stable. Continues Biktarvy 1 tablet daily

## 2023-07-29 NOTE — Assessment & Plan Note (Signed)
Chronic stable condition. Continue rosuvastatin 20 mg daily.  

## 2023-08-02 ENCOUNTER — Ambulatory Visit: Payer: 59 | Admitting: Family

## 2023-08-05 ENCOUNTER — Ambulatory Visit (HOSPITAL_COMMUNITY): Payer: 59

## 2023-08-11 ENCOUNTER — Other Ambulatory Visit: Payer: Self-pay | Admitting: Emergency Medicine

## 2023-08-12 ENCOUNTER — Encounter (INDEPENDENT_AMBULATORY_CARE_PROVIDER_SITE_OTHER): Payer: Self-pay

## 2023-09-15 ENCOUNTER — Ambulatory Visit: Payer: 59 | Attending: Physician Assistant

## 2023-09-20 NOTE — Progress Notes (Signed)
This encounter was created in error - please disregard. I called patient twice with no answer.  Second time, I left a detailed voice message for pt to call office to reschedule appointment.

## 2023-09-22 ENCOUNTER — Other Ambulatory Visit: Payer: Self-pay | Admitting: Family

## 2023-10-07 ENCOUNTER — Ambulatory Visit: Payer: 59

## 2023-10-07 VITALS — Ht 65.0 in | Wt 135.0 lb

## 2023-10-07 DIAGNOSIS — Z0000 Encounter for general adult medical examination without abnormal findings: Secondary | ICD-10-CM | POA: Diagnosis not present

## 2023-10-07 NOTE — Patient Instructions (Signed)
Rodney Walker , Thank you for taking time to come for your Medicare Wellness Visit. I appreciate your ongoing commitment to your health goals. Please review the following plan we discussed and let me know if I can assist you in the future.   Referrals/Orders/Follow-Ups/Clinician Recommendations: No  This is a list of the screening recommended for you and due dates:  Health Maintenance  Topic Date Due   Flu Shot  07/29/2023   COVID-19 Vaccine (5 - 2023-24 season) 08/29/2023   Medicare Annual Wellness Visit  10/06/2024   DTaP/Tdap/Td vaccine (2 - Td or Tdap) 10/28/2026   HPV Vaccine  Completed   Hepatitis C Screening  Completed   HIV Screening  Completed    Advanced directives: (Declined) Advance directive discussed with you today. Even though you declined this today, please call our office should you change your mind, and we can give you the proper paperwork for you to fill out.  Next Medicare Annual Wellness Visit scheduled for next year: Yes

## 2023-10-07 NOTE — Progress Notes (Signed)
Subjective:   Rodney Walker is a 42 y.o. male who presents for Medicare Annual/Subsequent preventive examination.  Visit Complete: Virtual I connected with  Rodney Walker on 10/07/23 by a audio enabled telemedicine application and verified that I am speaking with the correct person using two identifiers.  Patient Location: Home  Provider Location: Office/Clinic  I discussed the limitations of evaluation and management by telemedicine. The patient expressed understanding and agreed to proceed.  Vital Signs: Because this visit was a virtual/telehealth visit, some criteria may be missing or patient reported. Any vitals not documented were not able to be obtained and vitals that have been documented are patient reported.  Cardiac Risk Factors include: family history of premature cardiovascular disease;male gender     Objective:    Today's Vitals   10/07/23 1636  Weight: 135 lb (61.2 kg)  Height: 5\' 5"  (1.651 m)  PainSc: 0-No pain   Body mass index is 22.47 kg/m.     10/07/2023    4:38 PM 07/04/2023    7:01 PM 10/08/2022   10:49 AM 09/18/2022    2:44 PM 01/09/2022    4:26 AM 09/12/2021    4:00 PM 09/25/2019    3:37 PM  Advanced Directives  Does Patient Have a Medical Advance Directive? No No No No No No   Would patient like information on creating a medical advance directive? No - Patient declined  No - Patient declined No - Patient declined  No - Patient declined No - Patient declined    Current Medications (verified) Outpatient Encounter Medications as of 10/07/2023  Medication Sig   acetaminophen (TYLENOL) 500 MG tablet Take 1 tablet (500 mg total) by mouth every 6 (six) hours as needed.   albuterol (VENTOLIN HFA) 108 (90 Base) MCG/ACT inhaler Inhale 2 puffs into the lungs every 6 (six) hours as needed for wheezing or shortness of breath. (Patient not taking: Reported on 07/08/2023)   aspirin EC 81 MG tablet Take 81 mg by mouth daily. Swallow whole.    bictegravir-emtricitabine-tenofovir AF (BIKTARVY) 50-200-25 MG TABS tablet Take 1 tablet by mouth daily.   cetirizine (ZYRTEC) 10 MG tablet TAKE 1 TABLET(10 MG) BY MOUTH DAILY   EPINEPHrine 0.3 mg/0.3 mL IJ SOAJ injection Inject 0.3 mg into the muscle as needed for anaphylaxis. (Patient not taking: Reported on 07/15/2023)   flunisolide (NASALIDE) 25 MCG/ACT (0.025%) SOLN Place 2 sprays into the nose daily as needed (allergies). (Patient not taking: Reported on 07/08/2023)   metoprolol succinate (TOPROL-XL) 25 MG 24 hr tablet Take 1 tablet (25 mg total) by mouth daily. Please make overdue appt with Dr. Elease Hashimoto before anymore refills. Thank you 2nd attempt (Patient not taking: Reported on 07/15/2023)   nitroGLYCERIN (NITROSTAT) 0.4 MG SL tablet Place 1 tablet (0.4 mg total) under the tongue every 5 (five) minutes x 3 doses as needed for chest pain. (Patient not taking: Reported on 07/08/2023)   prasugrel (EFFIENT) 10 MG TABS tablet Take 1 tablet (10 mg total) by mouth daily.   rosuvastatin (CRESTOR) 20 MG tablet Take 1 tablet (20 mg total) by mouth daily.   No facility-administered encounter medications on file as of 10/07/2023.    Allergies (verified) Patient has no known allergies.   History: Past Medical History:  Diagnosis Date   Anemia    COPD (chronic obstructive pulmonary disease) (HCC)    Coronary artery disease    MI 2010 - dissection/plaque rupture in LAD tx with antiplt/antithrombotics >> relook cath with resolution (no PCI  performed) // Inf STEMI 3/19: OM1 30; pRCA 100 >> DES // Echo 3/19:  mild LVH, EF 40-45, inf/inf-sept HK, trivial MR   Eczema    GERD (gastroesophageal reflux disease)    History of blood clots    History of myocardial infarction 2010; 2019   History of pleural empyema    HIV infection (HCC)    Hyperlipidemia    Hypertension    Immune deficiency disorder (HCC)    Ischemic cardiomyopathy 04/05/2018   Moderate persistent asthma without complication 10/29/2016    Myocardial infarction [facility])    2009, 2019   Pneumonia    Past Surgical History:  Procedure Laterality Date   CARDIAC CATHETERIZATION  06/24/2009   EF 60%   CARDIAC CATHETERIZATION  06/17/2009   EF 45%. ANTERIOR HYPOKINESIS   CORONARY STENT INTERVENTION N/A 03/24/2018   Procedure: CORONARY STENT INTERVENTION;  Surgeon: Tonny Bollman, MD;  Location: [facility] INVASIVE CV LAB;  Service: Cardiovascular;  Laterality: N/A;   CORONARY/GRAFT ACUTE MI REVASCULARIZATION N/A 03/24/2018   Procedure: Coronary/Graft Acute MI Revascularization;  Surgeon: Tonny Bollman, MD;  Location: [facility] INVASIVE CV LAB;  Service: Cardiovascular;  Laterality: N/A;   LEFT HEART CATH AND CORONARY ANGIOGRAPHY N/A 03/24/2018   Procedure: LEFT HEART CATH AND CORONARY ANGIOGRAPHY;  Surgeon: Tonny Bollman, MD;  Location: [facility] INVASIVE CV LAB;  Service: Cardiovascular;  Laterality: N/A;   SINUS IRRIGATION  10/08/2022   Procedure: BRONCHIAL LAVAGE;  Surgeon: Lupita Leash, MD;  Location: [facility] ENDOSCOPY;  Service: Cardiopulmonary;;   TOTAL HIP ARTHROPLASTY Right 09/25/2019   Procedure: RIGHT TOTAL HIP ARTHROPLASTY ANTERIOR APPROACH;  Surgeon: Tarry Kos, MD;  Location: [facility] OR;  Service: Orthopedics;  Laterality: Right;   US ECHOCARDIOGRAPHY  12/09/2009   EF 55-60%   VIDEO ASSISTED THORACOSCOPY (VATS)/ LOBECTOMY Right 04/23/2015   Procedure: VIDEO ASSISTED THORACOSCOPY (VATS)/RIGHT upper  and right middle LOBECTOMY;  Surgeon: Kerin Perna, MD;  Location: [facility] OR;  Service: Thoracic;  Laterality: Right;   VIDEO BRONCHOSCOPY Bilateral 09/05/2014   Procedure: VIDEO BRONCHOSCOPY WITH FLUORO;  Surgeon: Merwyn Katos, MD;  Location: [facility] ENDOSCOPY;  Service: Cardiopulmonary;  Laterality: Bilateral;   VIDEO BRONCHOSCOPY Bilateral 04/10/2015   Procedure: VIDEO BRONCHOSCOPY WITH FLUORO;  Surgeon: Lupita Leash, MD;  Location: [facility] ENDOSCOPY;  Service: Cardiopulmonary;  Laterality: Bilateral;   VIDEO BRONCHOSCOPY N/A 10/08/2022   Procedure:  VIDEO BRONCHOSCOPY WITHOUT FLUORO;  Surgeon: Lupita Leash, MD;  Location: [facility] ENDOSCOPY;  Service: Cardiopulmonary;  Laterality: N/A;   Family History  Problem Relation Age of Onset   Diabetes Mother    Eczema Mother    Asthma Mother    COPD Mother    Hypertension Mother    Hyperlipidemia Mother    Stroke Mother    Heart disease Mother    Sleep apnea Mother    Liver cancer Father    Asthma Brother    Diabetes Maternal Grandmother    Heart failure Maternal Grandmother    Colon cancer Neg Hx    Rectal cancer Neg Hx    Stomach cancer Neg Hx    Social History   Socioeconomic History   Marital status: Single    Spouse name: Not on file   Number of children: 0   Years of education: Not on file   Highest education level: Not on file  Occupational History   Occupation: unemployed  Tobacco Use   Smoking status: Former    Current packs/day: 0.00    Average packs/day: 0.3 packs/day for  17.0 years (4.3 ttl pk-yrs)    Types: Cigarettes, Cigars    Start date: 03/22/1998    Quit date: 03/23/2015    Years since quitting: 8.5   Smokeless tobacco: Never  Vaping Use   Vaping status: Never Used  Substance and Sexual Activity   Alcohol use: Yes    Comment: occasional   Drug use: Yes    Frequency: 7.0 times per week    Types: Marijuana    Comment: used yesterday at 1400   Sexual activity: Not Currently    Partners: Male    Comment: accepted condoms  Other Topics Concern   Not on file  Social History Narrative   Not on file   Social Determinants of Health   Financial Resource Strain: Low Risk  (10/07/2023)   Overall Financial Resource Strain (CARDIA)    Difficulty of Paying Living Expenses: Not hard at all  Food Insecurity: No Food Insecurity (10/07/2023)   Hunger Vital Sign    Worried About Running Out of Food in the Last Year: Never true    Ran Out of Food in the Last Year: Never true  Transportation Needs: No Transportation Needs (10/07/2023)   PRAPARE -  Administrator, Civil Service (Medical): No    Lack of Transportation (Non-Medical): No  Physical Activity: Sufficiently Active (10/07/2023)   Exercise Vital Sign    Days of Exercise per Week: 3 days    Minutes of Exercise per Session: 60 min  Stress: No Stress Concern Present (10/07/2023)   [employer] of Occupational Health - Occupational Stress Questionnaire    Feeling of Stress : Not at all  Social Connections: Moderately Isolated (10/07/2023)   Social Connection and Isolation Panel [NHANES]    Frequency of Communication with Friends and Family: More than three times a week    Frequency of Social Gatherings with Friends and Family: More than three times a week    Attends Religious Services: More than 4 times per year    Active Member of [employer] or Organizations: No    Attends Engineer, structural: Never    Marital Status: Never married    Tobacco Counseling Counseling given: Not Answered   Clinical Intake:  Pre-visit preparation completed: Yes  Pain : No/denies pain Pain Score: 0-No pain     BMI - recorded: 22.47 Nutritional Status: BMI of 19-24  Normal Nutritional Risks: None Diabetes: No  How often do you need to have someone help you when you read instructions, pamphlets, or other written materials from your doctor or pharmacy?: 1 - Never What is the last grade level you completed in school?: HSG  Interpreter Needed?: No  Information entered by :: Carisha Kantor N. Furqan Gosselin, LPN.   Activities of Daily Living    10/07/2023    4:39 PM  In your present state of health, do you have any difficulty performing the following activities:  Hearing? 0  Vision? 0  Difficulty concentrating or making decisions? 0  Walking or climbing stairs? 0  Dressing or bathing? 0  Doing errands, shopping? 0  Preparing Food and eating ? N  Using the Toilet? N  In the past six months, have you accidently leaked urine? N  Do you have problems with loss of bowel  control? N  Managing your Medications? N  Managing your Finances? N  Housekeeping or managing your Housekeeping? N    Patient Care Team: Georgina Quint, MD as PCP - General (Internal Medicine) [name], [name], MD as  PCP - Cardiology (Cardiology) Judyann Munson, MD as Consulting Physician (Infectious Diseases) Judyann Munson, MD as Consulting Physician (Infectious Diseases)  Indicate any recent Medical Services you may have received from other than [facility] providers in the past year (date may be approximate).     Assessment:   This is a routine wellness examination for Taijon.  Hearing/Vision screen Hearing Screening - Comments:: Denies hearing difficulties; no hearing aids.    Vision Screening - Comments:: Patient does not wear any corrective lenses/contacts.     Goals Addressed             This Visit's Progress    care coordination activities: no follow up required       Care Coordination Interventions: Advised patient to contact RN if care management/care coordination needs in the future Care gaps discussed       My goal is to try and get rid of these sinus issues or allergies, so that I can start singing again.        Depression Screen    10/07/2023    4:39 PM 07/29/2023    2:37 PM 03/25/2023    4:15 PM 09/18/2022    2:43 PM 08/24/2022   10:17 AM 05/26/2022    4:15 PM 02/19/2022    4:25 PM  PHQ 2/9 Scores  PHQ - 2 Score 0 0 0 0 0 0 0  PHQ- 9 Score 1          Fall Risk    10/07/2023    4:39 PM 07/29/2023    2:37 PM 03/25/2023    4:15 PM 09/18/2022    2:41 PM 08/24/2022   10:17 AM  Fall Risk   Falls in the past year? 0 0 0 0 0  Number falls in past yr: 0 0 0 0   Injury with Fall? 0 0 0 0   Risk for fall due to : No Fall Risks No Fall Risks No Fall Risks No Fall Risks No Fall Risks  Follow up Falls prevention discussed Falls evaluation completed Falls evaluation completed Falls prevention discussed Falls evaluation completed    MEDICARE RISK AT  HOME: Medicare Risk at Home Any stairs in or around the home?: No If so, are there any without handrails?: No Home free of loose throw rugs in walkways, pet beds, electrical cords, etc?: Yes Adequate lighting in your home to reduce risk of falls?: Yes Life alert?: No Use of a cane, walker or w/c?: No Grab bars in the bathroom?: No Shower chair or bench in shower?: No Elevated toilet seat or a handicapped toilet?: Yes  TIMED UP AND GO:  Was the test performed?  No    Cognitive Function:    10/07/2023    4:43 PM  MMSE - Mini Mental State Exam  Not completed: Unable to complete        10/07/2023    4:42 PM 09/18/2022    2:45 PM  6CIT Screen  What Year? 0 points 0 points  What month? 0 points 0 points  What time? 0 points 0 points  Count back from 20 0 points 0 points  Months in reverse 0 points 0 points  Repeat phrase 0 points 0 points  Total Score 0 points 0 points    Immunizations Immunization History  Administered Date(s) Administered   HPV 9-valent 08/08/2018, 09/05/2018, 02/03/2019   Hepatitis A, Adult 09/26/2014, 03/14/2015   Hepatitis B, ADULT 09/26/2014, 01/23/2015, 03/14/2015   Influenza,inj,Quad PF,6+ Mos 09/26/2014, 10/16/2015, 10/08/2016, 10/06/2017, 11/07/2018,  10/02/2019, 09/19/2020, 11/13/2021, 08/24/2022   Meningococcal Mcv4o 08/08/2018, 11/07/2018   PFIZER Comirnaty(Gray Top)Covid-19 Tri-Sucrose Vaccine 08/27/2021   PFIZER(Purple Top)SARS-COV-2 Vaccination 04/03/2020, 04/24/2020, 11/08/2020   PNEUMOCOCCAL CONJUGATE-20 02/19/2022   Pneumococcal Conjugate-13 05/05/2018   Pneumococcal Polysaccharide-23 09/26/2014   Tdap 10/28/2016    TDAP status: Up to date  Flu Vaccine status: Due, Education has been provided regarding the importance of this vaccine. Advised may receive this vaccine at local pharmacy or Health Dept. Aware to provide a copy of the vaccination record if obtained from local pharmacy or Health Dept. Verbalized acceptance and  understanding.  Pneumococcal vaccine status: Up to date  Covid-19 vaccine status: Completed vaccines  Qualifies for Shingles Vaccine? No   Zostavax completed No   Shingrix Completed?: No.    Education has been provided regarding the importance of this vaccine. Patient has been advised to call insurance company to determine out of pocket expense if they have not yet received this vaccine. Advised may also receive vaccine at local pharmacy or Health Dept. Verbalized acceptance and understanding.  Screening Tests Health Maintenance  Topic Date Due   INFLUENZA VACCINE  07/29/2023   COVID-19 Vaccine (5 - 2023-24 season) 08/29/2023   Medicare Annual Wellness (AWV)  10/06/2024   DTaP/Tdap/Td (2 - Td or Tdap) 10/28/2026   HPV VACCINES  Completed   Hepatitis C Screening  Completed   HIV Screening  Completed    Health Maintenance  Health Maintenance Due  Topic Date Due   INFLUENZA VACCINE  07/29/2023   COVID-19 Vaccine (5 - 2023-24 season) 08/29/2023    Colorectal cancer screening: Type of screening: Colonoscopy. Completed 07/15/2023. Repeat every 10 years  Lung Cancer Screening: (Low Dose CT Chest recommended if Age 37-80 years, 20 pack-year currently smoking OR have quit w/in 15years.) does not qualify.   Lung Cancer Screening Referral: no  Additional Screening:  Hepatitis C Screening: does qualify; Completed 01/28/2017  Vision Screening: Recommended annual ophthalmology exams for early detection of glaucoma and other disorders of the eye. Is the patient up to date with their annual eye exam?  No  Who is the provider or what is the name of the office in which the patient attends annual eye exams? Eye Exam Due If pt is not established with a provider, would they like to be referred to a provider to establish care? No .   Dental Screening: Recommended annual dental exams for proper oral hygiene  Diabetic Foot Exam: N/A  Community Resource Referral / Chronic Care Management: CRR  required this visit?  No   CCM required this visit?  No     Plan:     I have personally reviewed and noted the following in the patient's chart:   Medical and social history Use of alcohol, tobacco or illicit drugs  Current medications and supplements including opioid prescriptions. Patient is not currently taking opioid prescriptions. Functional ability and status Nutritional status Physical activity Advanced directives List of other physicians Hospitalizations, surgeries, and ER visits in previous 12 months Vitals Screenings to include cognitive, depression, and falls Referrals and appointments  In addition, I have reviewed and discussed with patient certain preventive protocols, quality metrics, and best practice recommendations. A written personalized care plan for preventive services as well as general preventive health recommendations were provided to patient.     Mickeal Needy, LPN   [id number]/[id number]   After Visit Summary: (MyChart) Due to this being a telephonic visit, the after visit summary with patients personalized plan was offered to  patient via MyChart   Nurse Notes: Normal cognitive status assessed by direct observation via telephone conversation by this Nurse Health Advisor. No abnormalities found.

## 2023-11-08 ENCOUNTER — Other Ambulatory Visit: Payer: Self-pay | Admitting: Family

## 2023-12-06 ENCOUNTER — Encounter: Payer: Self-pay | Admitting: Cardiovascular Disease

## 2023-12-06 NOTE — Progress Notes (Signed)
Patient cancelled   This encounter was created in error - please disregard. 

## 2023-12-07 ENCOUNTER — Ambulatory Visit: Payer: 59 | Admitting: Cardiovascular Disease

## 2024-01-27 ENCOUNTER — Other Ambulatory Visit: Payer: Self-pay | Admitting: Emergency Medicine

## 2024-03-02 ENCOUNTER — Other Ambulatory Visit: Payer: Self-pay | Admitting: Family

## 2024-03-06 ENCOUNTER — Encounter: Payer: Self-pay | Admitting: Cardiovascular Disease

## 2024-03-06 NOTE — Progress Notes (Unsigned)
 Rodney Walker Date of Birth  Mar 04, 1981 [facility] [address]    [address] [place], [place]  [address] [phone]  Fax  [phone]    Rodney Walker is a 43 y.o.  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  He was on Plavix for a year.  He is now on Aspirin but admits to not taking it regularly.    He remains active,  He works at [employer].  He does not get any regular aerobic exercise.  November 11, 2018:  Rodney Walker is seen back today for a follow-up visit.  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  Hx of HIV, ashtma   Is admitted to the hospital in March, 2019 with an inferior wall ST segment elevation myocardial infarction.  He had atherectomy and then followed by drug-eluting stent to the proximal RCA.  He is done well.  He is not had any episodes of chest pain or shortness of breath.  He needs to have hip surgery  He is at low risk for this hip surgery  He may hold Effient for 7 days prior to the procedure. I would prefer that he continue the ASA during this time  July 31, 2020: Rodney Walker is seen today for follow-up visit.  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 Is back exercising and doing .  Walks every day .  No CP    Aug. 23, 2023 Rodney Walker is seen for follow up of his SCAD  Is on ASA and Brilinta  Had some chest pain recently   CTA of the lungs  - no evidence of PE. Trop levels were negative  No evidence of SCAD   Severe emphysema - saw Dr. Craige Cotta previously   Dec. 10, 2024 Pt cancelled, rescheduled   March 07, 2024  Rodney Walker is seen for follow up of his SCAD,  Was started on  ASA and Brilinta   Has severe emphysemia  Sees Dr. Craige Cotta   No further episodes of CP  Is chronically short of breath  Has severe COPD ,   still smokes pot    Current Outpatient Medications on File Prior to Visit  Medication Sig Dispense Refill   acetaminophen (TYLENOL) 500 MG tablet Take 1 tablet (500 mg total) by mouth every 6 (six) hours as needed. 30 tablet 0   albuterol (VENTOLIN HFA) 108 (90 Base) MCG/ACT inhaler INHALE 2 PUFFS INTO THE LUNGS EVERY 6 HOURS AS NEEDED FOR WHEEZING OR SHORTNESS OF BREATH 6.7 g 2   aspirin EC 81 MG tablet Take 81 mg by mouth daily. Swallow whole.     bictegravir-emtricitabine-tenofovir AF (BIKTARVY) 50-200-25 MG TABS tablet Take 1 tablet by mouth daily. 30 tablet 6   cetirizine (ZYRTEC) 10 MG tablet TAKE 1 TABLET(10 MG) BY MOUTH DAILY 90 tablet 0   prasugrel (EFFIENT) 10 MG TABS tablet Take 1 tablet (10 mg total) by mouth daily. 30 tablet 11   EPINEPHrine 0.3 mg/0.3 mL IJ SOAJ injection Inject 0.3 mg into the muscle as needed for anaphylaxis. (Patient not taking: Reported on 07/15/2023)     flunisolide (NASALIDE) 25 MCG/ACT (0.025%) SOLN Place 2 sprays into the nose daily as needed (allergies). (Patient not taking: Reported on 07/08/2023)     metoprolol succinate (TOPROL-XL) 25 MG 24 hr tablet  Take 1 tablet (25 mg total) by mouth daily. Please make overdue appt with Dr. Elease Hashimoto before anymore refills. Thank you 2nd attempt (Patient not taking: Reported on 07/15/2023) 15 tablet 0   nitroGLYCERIN (NITROSTAT) 0.4 MG SL tablet Place 1 tablet (0.4 mg total) under the tongue every 5 (five) minutes x 3 doses as needed for chest pain. (Patient not taking: Reported on 07/08/2023) 25 tablet 0   rosuvastatin (CRESTOR) 20 MG tablet Take 1 tablet (20 mg total) by mouth daily. 90 tablet 3   No current facility-administered medications on file prior to visit.    No Known Allergies  Past Medical History:  Diagnosis Date   Anemia    COPD (chronic obstructive pulmonary disease) (HCC)    Coronary artery disease    MI 2010 - dissection/plaque rupture in LAD tx with antiplt/antithrombotics >> relook cath with  resolution (no PCI performed) // Inf STEMI 3/19: OM1 30; pRCA 100 >> DES // Echo 3/19:  mild LVH, EF 40-45, inf/inf-sept HK, trivial MR   Eczema    GERD (gastroesophageal reflux disease)    History of blood clots    History of myocardial infarction 2010; 2019   History of pleural empyema    HIV infection (HCC)    Hyperlipidemia    Hypertension    Immune deficiency disorder (HCC)    Ischemic cardiomyopathy 04/05/2018   Moderate persistent asthma without complication 10/29/2016   Myocardial infarction (HCC)    2009, 2019   Pneumonia     Past Surgical History:  Procedure Laterality Date   CARDIAC CATHETERIZATION  06/24/2009   EF 60%   CARDIAC CATHETERIZATION  06/17/2009   EF 45%. ANTERIOR HYPOKINESIS   CORONARY STENT INTERVENTION N/A 03/24/2018   Procedure: CORONARY STENT INTERVENTION;  Surgeon: Tonny Bollman, MD;  [facility] INVAS[facility] CV LAB;  Service: Cardiovascular;  Laterality: N/A;   CORONARY/GRAFT ACUTE MI REVASCULARIZATION N/A 03/24/2018   Procedure: Coronary/Graft Acute MI Revascularization;  Surgeon: Tonny Bollman, MD;  Location: [facility] INVASIVE CV LAB;  Service: Cardiovascular;  Laterality: N/A;   LEFT HEART CATH AND CORONARY ANGIOGRAPHY N/A 03/24/2018   Procedure: LEFT HEART CATH AND CORONARY ANGIOGRAPHY;  Surgeon: Tonny Bollman, MD;  Location: [facility] INVASIVE CV LAB;  Service: Cardiovascular;  Laterality: N/A;   SINUS IRRIGATION  10/08/2022   Procedure: BRONCHIAL LAVAGE;  Surgeon: Lupita Leash, MD;  Location: [facility] ENDOSCOPY;  Service: Cardiopulmonary;;   TOTAL HIP ARTHROPLASTY Right 09/25/2019   Procedure: RIGHT TOTAL HIP ARTHROPLASTY ANTERIOR APPROACH;  Surgeon: Tarry Kos, MD;  Location: [facility] OR;  Service: Orthopedics;  Laterality: Right;   US ECHOCARDIOGRAPHY  12/09/2009   EF 55-60%   VIDEO ASSISTED THORACOSCOPY (VATS)/ LOBECTOMY Right 04/23/2015   Procedure: VIDEO ASSISTED THORACOSCOPY (VATS)/RIGHT upper  and right middle LOBECTOMY;  Surgeon: Kerin Perna, MD;   Location: [facility] OR;  Service: Thoracic;  Laterality: Right;   VIDEO BRONCHOSCOPY Bilateral 09/05/2014   Procedure: VIDEO BRONCHOSCOPY WITH FLUORO;  Surgeon: Merwyn Katos, MD;  Location: [facility] ENDOSCOPY;  Service: Cardiopulmonary;  Laterality: Bilateral;   VIDEO BRONCHOSCOPY Bilateral 04/10/2015   Procedure: VIDEO BRONCHOSCOPY WITH FLUORO;  Surgeon: Lupita Leash, MD;  Location: [facility] ENDOSCOPY;  Service: Cardiopulmonary;  Laterality: Bilateral;   VIDEO BRONCHOSCOPY N/A 10/08/2022   Procedure: VIDEO BRONCHOSCOPY WITHOUT FLUORO;  Surgeon: Lupita Leash, MD;  Location: [facility] ENDOSCOPY;  Service: Cardiopulmonary;  Laterality: N/A;    Social History   Tobacco Use  Smoking Status Former   Current packs/day: 0.00   Average packs/day:  0.3 packs/day for 17.0 years (4.3 ttl pk-yrs)   Types: Cigarettes, Cigars   Start date: 03/22/1998   Quit date: 03/23/2015   Years since quitting: 8.9  Smokeless Tobacco Never    Social History   Substance and Sexual Activity  Alcohol Use Yes   Comment: occasional    Family History  Problem Relation Age of Onset   Diabetes Mother    Eczema Mother    Asthma Mother    COPD Mother    Hypertension Mother    Hyperlipidemia Mother    Stroke Mother    Heart disease Mother    Sleep apnea Mother    Liver cancer Father    Asthma Brother    Diabetes Maternal Grandmother    Heart failure Maternal Grandmother    Colon cancer Neg Hx    Rectal cancer Neg Hx    Stomach cancer Neg Hx     Reviw of Systems:  Reviewed in the HPI.  All other systems are negative.  Physical Exam: Blood pressure 110/72, pulse 97, height 5\' 5"  (1.651 m), weight 138 lb 3.2 oz (62.7 kg), SpO2 93%.       GEN:  thin, young male ,  in no acute distress HEENT: Normal NECK: No JVD; No carotid bruits LYMPHATICS: No lymphadenopathy CARDIAC: RRR , no murmurs, rubs, gallops RESPIRATORY:  Clear to auscultation without rales, wheezing or rhonchi  ABDOMEN: Soft, non-tender,  non-distended MUSCULOSKELETAL:  No edema; No deformity  SKIN: Warm and dry NEUROLOGIC:  Alert and oriented x 3     ECG:        Assessment / Plan:   1.  CAD:  had an episode of SCAD .  No angina  Refill Toprol XL 25 mg a day    2. COPD :  advised him to stop smoking completely        Kristeen Miss, MD  03/07/2024 4:49 PM    [facility] [address] [place],  [address] [place], [place]  [address] Phone: [phone]; Fax: [phone]

## 2024-03-07 ENCOUNTER — Ambulatory Visit: Payer: 59 | Attending: Cardiovascular Disease | Admitting: Cardiovascular Disease

## 2024-03-07 ENCOUNTER — Encounter: Payer: Self-pay | Admitting: Cardiovascular Disease

## 2024-03-07 VITALS — BP 110/72 | HR 97 | Ht 65.0 in | Wt 138.2 lb

## 2024-03-07 DIAGNOSIS — R000 Tachycardia, unspecified: Secondary | ICD-10-CM | POA: Diagnosis not present

## 2024-03-07 DIAGNOSIS — E785 Hyperlipidemia, unspecified: Secondary | ICD-10-CM | POA: Diagnosis not present

## 2024-03-07 MED ORDER — METOPROLOL SUCCINATE ER 25 MG PO TB24: 25.0000 mg | ORAL_TABLET | Freq: Every day | ORAL | 3 refills | Status: AC

## 2024-03-07 NOTE — Patient Instructions (Signed)
 Medication Instructions:  REFILLED Metoprolol  Follow-Up: At [facility], you and your health needs are our priority.  As part of our continuing mission to provide you with exceptional heart care, we have created designated Provider Care Teams.  These Care Teams include your primary Cardiologist (physician) and Advanced Practice Providers (APPs -  Physician Assistants and Nurse Practitioners) who all work together to provide you with the care you need, when you need it.  Your next appointment:   1 year(s)  Provider:   Kristeen Miss, MD      1st Floor: - Lobby - Registration  - Pharmacy  - Lab - Cafe  2nd Floor: - PV Lab - Diagnostic Testing (echo, CT, nuclear med)  3rd Floor: - Vacant  4th Floor: - TCTS (cardiothoracic surgery) - AFib Clinic - Structural Heart Clinic - Vascular Surgery  - Vascular Ultrasound  5th Floor: - [facility]Care Cardiology (general and EP) - Clinical Pharmacy for coumadin, hypertension, lipid, weight-loss medications, and med management appointments    Valet parking services will be available as well.

## 2024-03-28 ENCOUNTER — Other Ambulatory Visit: Payer: Self-pay | Admitting: Family

## 2024-03-28 DIAGNOSIS — Z21 Asymptomatic human immunodeficiency virus [HIV] infection status: Secondary | ICD-10-CM

## 2024-04-14 ENCOUNTER — Other Ambulatory Visit: Payer: Self-pay | Admitting: Physician Assistant

## 2024-04-22 ENCOUNTER — Other Ambulatory Visit: Payer: Self-pay | Admitting: Family

## 2024-04-22 DIAGNOSIS — Z21 Asymptomatic human immunodeficiency virus [HIV] infection status: Secondary | ICD-10-CM

## 2024-04-24 ENCOUNTER — Ambulatory Visit: Admitting: Infectious Diseases

## 2024-04-24 NOTE — Telephone Encounter (Signed)
 Appt 3:45

## 2024-05-09 ENCOUNTER — Other Ambulatory Visit: Payer: Self-pay

## 2024-05-09 ENCOUNTER — Encounter: Payer: Self-pay | Admitting: Infectious Diseases

## 2024-05-09 ENCOUNTER — Ambulatory Visit (INDEPENDENT_AMBULATORY_CARE_PROVIDER_SITE_OTHER): Admitting: Infectious Diseases

## 2024-05-09 VITALS — BP 114/73 | HR 98 | Temp 98.4°F | Wt 139.0 lb

## 2024-05-09 DIAGNOSIS — Z1211 Encounter for screening for malignant neoplasm of colon: Secondary | ICD-10-CM

## 2024-05-09 DIAGNOSIS — Z5181 Encounter for therapeutic drug level monitoring: Secondary | ICD-10-CM | POA: Diagnosis not present

## 2024-05-09 DIAGNOSIS — Z0000 Encounter for general adult medical examination without abnormal findings: Secondary | ICD-10-CM | POA: Diagnosis not present

## 2024-05-09 DIAGNOSIS — B20 Human immunodeficiency virus [HIV] disease: Secondary | ICD-10-CM | POA: Insufficient documentation

## 2024-05-09 DIAGNOSIS — Z79899 Other long term (current) drug therapy: Secondary | ICD-10-CM | POA: Diagnosis not present

## 2024-05-09 DIAGNOSIS — Z21 Asymptomatic human immunodeficiency virus [HIV] infection status: Secondary | ICD-10-CM

## 2024-05-09 DIAGNOSIS — Z113 Encounter for screening for infections with a predominantly sexual mode of transmission: Secondary | ICD-10-CM

## 2024-05-09 MED ORDER — BIKTARVY 50-200-25 MG PO TABS: 1.0000 | ORAL_TABLET | Freq: Every day | ORAL | 11 refills | Status: DC

## 2024-05-09 NOTE — Progress Notes (Unsigned)
 [address], [place], [place], [address]                                                                  Phn. [phone]; Fax: [phone]                                                                             Date: 05/09/24  Reason for Visit: Routine HIV care.    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    5/13 Reports compliance with Biktarvy  but has missed doses approximately five times in the last month, primarily due to falling asleep and waking up after midnight. No recent sexual activity is reported, and his sexual preference is male.He smokes marijuana and drinks alcohol occasionally, typically during holidays. 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 Lives with sister. He wants to follow dentist. Wants to do std screen and anal pap. No other complaints.    ROS: As stated in above HPI; all other systems were reviewed and are otherwise negative unless noted below  No reported fever / chills, night sweats, unintentional weight loss, acute visual change, odynophagia, chest pain/pressure, new or worsened SOB or WOB, nausea, vomiting, diarrhea, dysuria, GU discharge, syncope, seizures, red/hot swollen joints, hallucinations / delusions, rashes, new allergies, unusual / excessive bleeding, swollen lymph nodes, or new hospitalizations since the pt was last seen.  PMH/ PSH/ FamHx / Social Hx ,  medications and allergies reviewed and updated as appropriate; please see corresponding tab in EHR / prior notes                                        MEDS  No Known Allergies  Past Medical History:  Diagnosis Date   Anemia    COPD (chronic obstructive pulmonary disease) (HCC)    Coronary artery disease    MI 2010 - dissection/plaque rupture in LAD tx with antiplt/antithrombotics >> relook cath with resolution (no PCI performed) // Inf STEMI 3/19: OM1 30; pRCA 100 >> DES // Echo 3/19:  mild LVH, EF 40-45, inf/inf-sept HK, trivial MR   Eczema    GERD (gastroesophageal reflux disease)    History of blood clots  History of myocardial infarction 2010; 2019   History of pleural empyema    HIV infection (HCC)    Hyperlipidemia    Hypertension    Immune deficiency disorder (HCC)    Ischemic cardiomyopathy 04/05/2018   Moderate persistent asthma without complication 10/29/2016   Myocardial infarction [facility])    2009, 2019   Pneumonia    Past Surgical History:  Procedure Laterality Date   CARDIAC CATHETERIZATION  06/24/2009   EF 60%   CARDIAC CATHETERIZATION  06/17/2009   EF 45%. ANTERIOR HYPOKINESIS   CORONARY STENT INTERVENTION N/A 03/24/2018   Procedure: CORONARY STENT INTERVENTION;  Surgeon: Arnoldo Lapping, MD;  Location: [facility] INVASIVE CV LAB;  Service: Cardiovascular;  Laterality: N/A;   CORONARY/GRAFT ACUTE MI REVASCULARIZATION N/A 03/24/2018   Procedure: Coronary/Graft Acute MI Revascularization;  Surgeon: Arnoldo Lapping, MD;  Location: [facility] INVASIVE CV LAB;  Service: Cardiovascular;  Laterality: N/A;   LEFT HEART CATH AND CORONARY ANGIOGRAPHY N/A 03/24/2018   Procedure: LEFT HEART CATH AND CORONARY ANGIOGRAPHY;  Surgeon: Arnoldo Lapping, MD;  Location: [facility] INVASIVE CV LAB;  Service: Cardiovascular;  Laterality: N/A;   SINUS IRRIGATION  10/08/2022   Procedure: BRONCHIAL LAVAGE;  Surgeon: Marine Sia, MD;  Location: [facility] ENDOSCOPY;  Service: Cardiopulmonary;;   TOTAL HIP  ARTHROPLASTY Right 09/25/2019   Procedure: RIGHT TOTAL HIP ARTHROPLASTY ANTERIOR APPROACH;  Surgeon: Wes Hamman, MD;  Location: [facility] OR;  Service: Orthopedics;  Laterality: Right;   US  ECHOCARDIOGRAPHY  12/09/2009   EF 55-60%   VIDEO ASSISTED THORACOSCOPY (VATS)/ LOBECTOMY Right 04/23/2015   Procedure: VIDEO ASSISTED THORACOSCOPY (VATS)/RIGHT upper  and right middle LOBECTOMY;  Surgeon: Heriberto London, MD;  Location: [facility] OR;  Service: Thoracic;  Laterality: Right;   VIDEO BRONCHOSCOPY Bilateral 09/05/2014   Procedure: VIDEO BRONCHOSCOPY WITH FLUORO;  Surgeon: Valiant Gaul, MD;  Location: [facility] ENDOSCOPY;  Service: Cardiopulmonary;  Laterality: Bilateral;   VIDEO BRONCHOSCOPY Bilateral 04/10/2015   Procedure: VIDEO BRONCHOSCOPY WITH FLUORO;  Surgeon: Marine Sia, MD;  Location: [facility] ENDOSCOPY;  Service: Cardiopulmonary;  Laterality: Bilateral;   VIDEO BRONCHOSCOPY N/A 10/08/2022   Procedure: VIDEO BRONCHOSCOPY WITHOUT FLUORO;  Surgeon: Marine Sia, MD;  Location: [facility] ENDOSCOPY;  Service: Cardiopulmonary;  Laterality: N/A;   Social History   Socioeconomic History   Marital status: Single    Spouse name: Not on file   Number of children: 0   Years of education: Not on file   Highest education level: Not on file  Occupational History   Occupation: unemployed  Tobacco Use   Smoking status: Former    Current packs/day: 0.00    Average packs/day: 0.3 packs/day for 17.0 years (4.3 ttl pk-yrs)    Types: Cigarettes, Cigars    Start date: 03/22/1998    Quit date: 03/23/2015    Years since quitting: 9.1   Smokeless tobacco: Never  Vaping Use   Vaping status: Never Used  Substance and Sexual Activity   Alcohol use: Yes    Comment: occasional   Drug use: Yes    Frequency: 7.0 times per week    Types: Marijuana    Comment: daily   Sexual activity: Not Currently    Partners: Male    Comment: accepted condoms  Other Topics Concern   Not on file  Social History Narrative   Not on  file   Social Drivers of Health   Financial Resource Strain: Low Risk  (10/07/2023)   Overall Financial Resource Strain (CARDIA)    Difficulty of Paying  Living Expenses: Not hard at all  Food Insecurity: No Food Insecurity (10/07/2023)   Hunger Vital Sign    Worried About Running Out of Food in the Last Year: Never true    Ran Out of Food in the Last Year: Never true  Transportation Needs: No Transportation Needs (10/07/2023)   PRAPARE - Administrator, Civil Service (Medical): No    Lack of Transportation (Non-Medical): No  Physical Activity: Sufficiently Active (10/07/2023)   Exercise Vital Sign    Days of Exercise per Week: 3 days    Minutes of Exercise per Session: 60 min  Stress: No Stress Concern Present (10/07/2023)   [employer] of Occupational Health - Occupational Stress Questionnaire    Feeling of Stress : Not at all  Social Connections: Moderately Isolated (10/07/2023)   Social Connection and Isolation Panel [NHANES]    Frequency of Communication with Friends and Family: More than three times a week    Frequency of Social Gatherings with Friends and Family: More than three times a week    Attends Religious Services: More than 4 times per year    Active Member of Clubs or Organizations: No    Attends Banker Meetings: Never    Marital Status: Never married  Intimate Partner Violence: Not At Risk (10/07/2023)   Humiliation, Afraid, Rape, and Kick questionnaire    Fear of Current or Ex-Partner: No    Emotionally Abused: No    Physically Abused: No    Sexually Abused: No   Family History  Problem Relation Age of Onset   Diabetes Mother    Eczema Mother    Asthma Mother    COPD Mother    Hypertension Mother    Hyperlipidemia Mother    Stroke Mother    Heart disease Mother    Sleep apnea Mother    Liver cancer Father    Asthma Brother    Diabetes Maternal Grandmother    Heart failure Maternal Grandmother    Colon cancer Neg Hx     Rectal cancer Neg Hx    Stomach cancer Neg Hx    Vitals  BP 114/73   Pulse 98   Temp 98.4 F (36.9 C) (Oral)   Wt 139 lb (63 kg)   SpO2 96%   BMI 23.13 kg/m    Examination  Gen: no acute distress HEENT: [place]/AT, no scleral icterus, no pale conjunctivae, hearing normal, oral mucosa moist Neck: Supple Cardio: Regular rate and rhythm, s1s2 Resp: Pulmonary effort normal in room air, Normal breath sounds  GI: nondistended, non tender and soft  GU: Musc: Extremities: No pedal edema Skin: No rashes Neuro: grossly non focal , awake, alert and oriented * 3  Psych: Calm, cooperative  Lab Results HIV 1 RNA Quant (Copies/mL)  Date Value  04/26/2023 32 (H)  03/25/2023 39 (H)  11/09/2022 <20 (H)   CD4 T Cell Abs (/uL)  Date Value  04/26/2023 1,098  11/09/2022 1,182  05/26/2022 789   Lab Results  Component Value Date   HIV1GENOSEQ  RT, Seq^REPORT 03/22/2014   Lab Results  Component Value Date   WBC 9.1 07/29/2023   HGB 15.5 07/29/2023   HCT 44.9 07/29/2023   MCV 95.3 07/29/2023   PLT 264.0 07/29/2023    Lab Results  Component Value Date   CREATININE 1.20 07/29/2023   BUN 18 07/29/2023   NA 136 07/29/2023   K 3.8 07/29/2023   CL 101 07/29/2023   CO2 29 07/29/2023  Lab Results  Component Value Date   ALT 14 07/29/2023   AST 17 07/29/2023   ALKPHOS 90 07/29/2023   BILITOT 0.5 07/29/2023    Lab Results  Component Value Date   CHOL 152 07/29/2023   TRIG 51.0 07/29/2023   HDL 57.10 07/29/2023   LDLCALC 85 07/29/2023   No results found for: "HAV" Lab Results  Component Value Date   HEPBSAG NEGATIVE 03/22/2014   Lab Results  Component Value Date   HCVAB NEGATIVE 01/28/2017   Lab Results  Component Value Date   CHLAMYDIAWP Negative 04/26/2023   N Negative 04/26/2023   No results found for: "GCPROBEAPT" Lab Results  Component Value Date   QUANTGOLD NEGATIVE 09/01/2014    Health Maintenance: Immunization History  Administered Date(s)  Administered   HPV 9-valent 08/08/2018, 09/05/2018, 02/03/2019   Hepatitis A, Adult 09/26/2014, 03/14/2015   Hepatitis B, ADULT 09/26/2014, 01/23/2015, 03/14/2015   Influenza,inj,Quad PF,6+ Mos 09/26/2014, 10/16/2015, 10/08/2016, 10/06/2017, 11/07/2018, 10/02/2019, 09/19/2020, 11/13/2021, 08/24/2022   Meningococcal Mcv4o 08/08/2018, 11/07/2018   PFIZER Comirnaty(Gray Top)Covid-19 Tri-Sucrose Vaccine 08/27/2021   PFIZER(Purple Top)SARS-COV-2 Vaccination 04/03/2020, 04/24/2020, 11/08/2020   PNEUMOCOCCAL CONJUGATE-20 02/19/2022   Pneumococcal Conjugate-13 05/05/2018   Pneumococcal Polysaccharide-23 09/26/2014   Tdap 10/28/2016    Assessment/Plan: # HIV   - continue Biktarvy   - labs today  - Fu in 4-5 months   # HTN/HLD/COPD/CAD - on meds including statin  - Fu with PCP   # STD Screening  - no acute concerns  - Urine, oral and anal GC + RPR  # Immunization  - Deferred  #Health maintenance Lipid panel  Anal Pap today Dental Care discussed  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 Patients questions were addressed and answered. Safe sex counseling done.  I have personally spent 45 minutes involved in face-to-face and non-face-to-face activities for this patient on the day of the visit. Professional time spent includes the following activities: Preparing to see the patient (review of tests), Obtaining and/or reviewing separately obtained history (admission/discharge record), Performing a medically appropriate examination and/or evaluation , Ordering medications/tests/procedures, referring and communicating with other health care professionals, Documenting clinical information in the EMR, Independently interpreting results (not separately reported), Communicating results to the patient/family/caregiver, Counseling and educating the patient/family/caregiver and Care coordination (not separately reported).   Of  note, portions of this note may have been created with voice recognition software. While this note has been edited for accuracy, occasional wrong-word or 'sound-a-like' substitutions may have occurred due to the inherent limitations of voice recognition software.   Electronically signed by:  Terre Ferri, MD Infectious Disease Physician [facility] [address] [place], [place] [address] Phone: [phone]  Fax: [phone]

## 2024-05-10 DIAGNOSIS — Z113 Encounter for screening for infections with a predominantly sexual mode of transmission: Secondary | ICD-10-CM | POA: Insufficient documentation

## 2024-05-10 LAB — GC/CHLAMYDIA PROBE, AMP (THROAT)
Chlamydia trachomatis RNA: NOT DETECTED
Neisseria gonorrhoeae RNA: NOT DETECTED

## 2024-05-10 LAB — C. TRACHOMATIS/N. GONORRHOEAE RNA
C. trachomatis RNA, TMA: NOT DETECTED
N. gonorrhoeae RNA, TMA: NOT DETECTED

## 2024-05-10 LAB — CT/NG RNA, TMA RECTAL
Chlamydia Trachomatis RNA: NOT DETECTED
Neisseria Gonorrhoeae RNA: NOT DETECTED

## 2024-05-12 LAB — COMPREHENSIVE METABOLIC PANEL WITH GFR
AG Ratio: 1.5 (calc) (ref 1.0–2.5)
ALT: 15 U/L (ref 9–46)
AST: 19 U/L (ref 10–40)
Albumin: 4.3 g/dL (ref 3.6–5.1)
Alkaline phosphatase (APISO): 80 U/L (ref 36–130)
BUN: 11 mg/dL (ref 7–25)
CO2: 27 mmol/L (ref 20–32)
Calcium: 9.1 mg/dL (ref 8.6–10.3)
Chloride: 105 mmol/L (ref 98–110)
Creat: 1.03 mg/dL (ref 0.60–1.29)
Globulin: 2.8 g/dL (ref 1.9–3.7)
Glucose, Bld: 89 mg/dL (ref 65–99)
Potassium: 3.7 mmol/L (ref 3.5–5.3)
Sodium: 139 mmol/L (ref 135–146)
Total Bilirubin: 0.5 mg/dL (ref 0.2–1.2)
Total Protein: 7.1 g/dL (ref 6.1–8.1)
eGFR: 92 mL/min/{1.73_m2} (ref >=60)

## 2024-05-12 LAB — CBC
HCT: 42.9 % (ref 38.5–50.0)
Hemoglobin: 14.8 g/dL (ref 13.2–17.1)
MCH: 32.9 pg (ref 27.0–33.0)
MCHC: 34.5 g/dL (ref 32.0–36.0)
MCV: 95.3 fL (ref 80.0–100.0)
MPV: 10.1 fL (ref 7.5–12.5)
Platelets: 213 10*3/uL (ref 140–400)
RBC: 4.5 10*6/uL (ref 4.20–5.80)
RDW: 13.2 % (ref 11.0–15.0)
WBC: 9.8 10*3/uL (ref 3.8–10.8)

## 2024-05-12 LAB — RPR: RPR Ser Ql: REACTIVE — AB

## 2024-05-12 LAB — LIPID PANEL
Cholesterol: 152 mg/dL (ref <200)
HDL: 61 mg/dL (ref >=40)
LDL Cholesterol (Calc): 74 mg/dL
Non-HDL Cholesterol (Calc): 91 mg/dL (ref <130)
Total CHOL/HDL Ratio: 2.5 (calc) (ref <5.0)
Triglycerides: 90 mg/dL (ref <150)

## 2024-05-12 LAB — HIV RNA, RTPCR W/R GT (RTI, PI,INT)
HIV 1 RNA Quant: 63 {copies}/mL — ABNORMAL HIGH
HIV-1 RNA Quant, Log: 1.8 {Log_copies}/mL — ABNORMAL HIGH

## 2024-05-12 LAB — T-HELPER CELLS (CD4) COUNT (NOT AT ARMC)
Absolute CD4: 1424 {cells}/uL (ref 490–1740)
CD4 T Helper %: 31 % (ref 30–61)
Total lymphocyte count: 4657 {cells}/uL — ABNORMAL HIGH (ref 850–3900)

## 2024-05-12 LAB — RPR TITER: RPR Titer: 1:8 {titer} — ABNORMAL HIGH

## 2024-05-12 LAB — T PALLIDUM AB: T Pallidum Abs: POSITIVE — AB

## 2024-05-15 LAB — CYTOLOGY - NON PAP

## 2024-05-16 ENCOUNTER — Other Ambulatory Visit: Payer: Self-pay | Admitting: Infectious Diseases

## 2024-05-16 ENCOUNTER — Other Ambulatory Visit: Payer: Self-pay | Admitting: Emergency Medicine

## 2024-05-16 ENCOUNTER — Ambulatory Visit: Payer: Self-pay | Admitting: Infectious Diseases

## 2024-05-16 DIAGNOSIS — R85610 Atypical squamous cells of undetermined significance on cytologic smear of anus (ASC-US): Secondary | ICD-10-CM

## 2024-05-16 NOTE — Telephone Encounter (Signed)
 Patient aware of results and referral.   Torin Whisner P Rashell Shambaugh, CMA

## 2024-07-24 ENCOUNTER — Other Ambulatory Visit: Payer: Self-pay

## 2024-07-24 ENCOUNTER — Emergency Department (HOSPITAL_COMMUNITY)
Admission: EM | Admit: 2024-07-24 | Discharge: 2024-07-24 | Disposition: A | Discharge status: Home / Self Care | Attending: Emergency Medicine | Admitting: Emergency Medicine

## 2024-07-24 ENCOUNTER — Emergency Department (HOSPITAL_COMMUNITY)

## 2024-07-24 ENCOUNTER — Encounter (HOSPITAL_COMMUNITY): Payer: Self-pay | Admitting: Emergency Medicine

## 2024-07-24 DIAGNOSIS — J069 Acute upper respiratory infection, unspecified: Secondary | ICD-10-CM | POA: Insufficient documentation

## 2024-07-24 DIAGNOSIS — Z902 Acquired absence of lung [part of]: Secondary | ICD-10-CM | POA: Diagnosis not present

## 2024-07-24 DIAGNOSIS — R918 Other nonspecific abnormal finding of lung field: Secondary | ICD-10-CM | POA: Diagnosis not present

## 2024-07-24 DIAGNOSIS — B9789 Other viral agents as the cause of diseases classified elsewhere: Secondary | ICD-10-CM | POA: Insufficient documentation

## 2024-07-24 DIAGNOSIS — R059 Cough, unspecified: Secondary | ICD-10-CM | POA: Diagnosis present

## 2024-07-24 DIAGNOSIS — J439 Emphysema, unspecified: Secondary | ICD-10-CM | POA: Insufficient documentation

## 2024-07-24 DIAGNOSIS — R0981 Nasal congestion: Secondary | ICD-10-CM | POA: Diagnosis not present

## 2024-07-24 LAB — RESP PANEL BY RT-PCR (RSV, FLU A&B, COVID)  RVPGX2
Influenza A by PCR: NEGATIVE
Influenza B by PCR: NEGATIVE
Resp Syncytial Virus by PCR: NEGATIVE
SARS Coronavirus 2 by RT PCR: NEGATIVE

## 2024-07-24 MED ORDER — IPRATROPIUM-ALBUTEROL 0.5-2.5 (3) MG/3ML IN SOLN
3.0000 mL | Freq: Once | RESPIRATORY_TRACT | Status: AC
  Administered 2024-07-24: 3 mL via RESPIRATORY_TRACT
  Filled 2024-07-24: qty 3

## 2024-07-24 MED ORDER — BENZONATATE 100 MG PO CAPS
100.0000 mg | ORAL_CAPSULE | Freq: Once | ORAL | Status: AC
  Administered 2024-07-24: 100 mg via ORAL
  Filled 2024-07-24: qty 1

## 2024-07-24 MED ORDER — PROMETHAZINE-DM 6.25-15 MG/5ML PO SYRP: 2.5000 mL | ORAL_SOLUTION | Freq: Four times a day (QID) | ORAL | 0 refills | Status: AC | PRN

## 2024-07-24 NOTE — ED Triage Notes (Addendum)
 Pt reports onset of cough, congestion, malaise on 7/25. Pt reports taking allergy medicine with no relief. Pt also took tylenol  yesterday, pt isn't sure if it helped or not.

## 2024-07-24 NOTE — ED Provider Notes (Signed)
 [facility] Emergency Department Provider Note MRN:  [id number]  Arrival date & time: 07/24/24     Chief Complaint   Nasal Congestion and Cough   History of Present Illness   Rodney Walker is a 43 y.o. year-old male presents to the ED with chief complaint of cold symptoms.  Reports hx of emphysema.  States that onset of symptoms was 3 days ago.  Denies fever.  States that he has tried taking OTC meds without much relief.  States that he has tried OTC allergy meds without relief.  History provided by patient.   Review of Systems  Pertinent positive and negative review of systems noted in HPI.    Physical Exam   Vitals:   07/24/24 0455  BP: 139/81  Pulse: 100  Resp: 15  Temp: 98.9 F (37.2 C)  SpO2: 100%    CONSTITUTIONAL:  -appearing, NAD NEURO:  Alert and oriented x 3, CN 3-12 grossly intact EYES:  eyes equal and reactive ENT/NECK:  Supple, no stridor  CARDIO:  normal rate, regular rhythm, appears well-perfused  PULM:  No respiratory distress, diminished in the bases GI/GU:  non-distended,  MSK/SPINE:  No gross deformities, no edema, moves all extremities  SKIN:  no rash, atraumatic   *Additional and/or pertinent findings included in MDM below  Diagnostic and Interventional Summary    EKG Interpretation Date/Time:    Ventricular Rate:    PR Interval:    QRS Duration:    QT Interval:    QTC Calculation:   R Axis:      Text Interpretation:         Labs Reviewed  RESP PANEL BY RT-PCR (RSV, FLU A&B, COVID)  [id number]    DG Chest Port 1 View  Final Result      Medications  ipratropium-albuterol  (DUONEB) 0.5-2.5 (3) MG/3ML nebulizer solution 3 mL (3 mLs Nebulization Given 07/24/24 0541)  benzonatate  (TESSALON ) capsule 100 mg (100 mg Oral Given 07/24/24 0541)     Procedures  /  Critical Care Procedures  ED Course and Medical Decision Making  I have reviewed the triage vital signs, the nursing notes, and pertinent  available records from the EMR.  Social Determinants Affecting Complexity of Care: Patient has no clinically significant social determinants affecting this chief complaint..   ED Course:    Medical Decision Making Pt CXR negative for acute infiltrate. Patients symptoms are consistent with URI, likely viral etiology. Discussed that antibiotics are not indicated for viral infections. Pt will be discharged with symptomatic treatment.  Verbalizes understanding and is agreeable with plan. Pt is hemodynamically stable & in NAD prior to dc.   Amount and/or Complexity of Data Reviewed Radiology: ordered.  Risk Prescription drug management.         Consultants: No consultations were needed in caring for this patient.   Treatment and Plan: Emergency department workup does not suggest an emergent condition requiring admission or immediate intervention beyond  what has been performed at this time. The patient is safe for discharge and has  been instructed to return immediately for worsening symptoms, change in  symptoms or any other concerns    Final Clinical Impressions(s) / ED Diagnoses     ICD-10-CM   1. Viral upper respiratory tract infection  J06.9       ED Discharge Orders          Ordered    promethazine -dextromethorphan (PROMETHAZINE -DM) 6.25-15 MG/5ML syrup  4 times daily PRN  07/24/24 9385              Discharge Instructions Discussed with and Provided to Patient:   Discharge Instructions   None      Vicky Charleston, DEVONNA 07/24/24 0615    Raford Lenis, MD 07/24/24 [phone]

## 2024-07-27 ENCOUNTER — Other Ambulatory Visit: Payer: Self-pay | Admitting: Physician Assistant

## 2024-09-18 ENCOUNTER — Other Ambulatory Visit (HOSPITAL_COMMUNITY)
Admission: RE | Admit: 2024-09-18 | Discharge: 2024-09-18 | Disposition: A | Discharge status: Home / Self Care | Source: Ambulatory Visit | Attending: Infectious Diseases | Admitting: Infectious Diseases

## 2024-09-18 ENCOUNTER — Ambulatory Visit (INDEPENDENT_AMBULATORY_CARE_PROVIDER_SITE_OTHER): Admitting: Infectious Diseases

## 2024-09-18 ENCOUNTER — Other Ambulatory Visit: Payer: Self-pay

## 2024-09-18 ENCOUNTER — Encounter: Payer: Self-pay | Admitting: Infectious Diseases

## 2024-09-18 VITALS — BP 109/70 | HR 105 | Temp 98.8°F | Ht 64.0 in | Wt 136.0 lb

## 2024-09-18 DIAGNOSIS — R3989 Other symptoms and signs involving the genitourinary system: Secondary | ICD-10-CM | POA: Diagnosis not present

## 2024-09-18 DIAGNOSIS — B20 Human immunodeficiency virus [HIV] disease: Secondary | ICD-10-CM

## 2024-09-18 DIAGNOSIS — Z79899 Other long term (current) drug therapy: Secondary | ICD-10-CM

## 2024-09-18 DIAGNOSIS — Z113 Encounter for screening for infections with a predominantly sexual mode of transmission: Secondary | ICD-10-CM

## 2024-09-18 DIAGNOSIS — Z0000 Encounter for general adult medical examination without abnormal findings: Secondary | ICD-10-CM

## 2024-09-18 DIAGNOSIS — Z23 Encounter for immunization: Secondary | ICD-10-CM | POA: Diagnosis not present

## 2024-09-18 DIAGNOSIS — Z7185 Encounter for immunization safety counseling: Secondary | ICD-10-CM

## 2024-09-18 NOTE — Progress Notes (Addendum)
 [address], [place], [place], [address]                                                                  Phn. [phone]; Fax: [phone]                                                                             Date: 09/18/24  Reason for Visit: Routine HIV care.   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    Today/Interval events  Compliant with Biktarvy  with no missed doses or concerns. Noticed a pea size small bump in the rt forarm approx 2 days ago, no reported trauma or injury. Feels abnormal sensation in tongue. Denies being sexually active. Smokes marijuana.   He also reports pressure sensation in his bladder after work out following by need to urinate and pass stool, going on for a  while. Denies dysuria, discharge, wound. Denies blood in stool or urine. Denies increased frequency of urination.   He is willing to undergo STD screening and get FLU vaccine  No other complaints.   ROS: As stated in above HPI; all other systems were reviewed and are otherwise negative unless noted below  No reported fever / chills, night sweats, unintentional weight loss, acute visual change, odynophagia, chest pain/pressure, new or worsened SOB or WOB, nausea, vomiting, diarrhea, dysuria, GU discharge, syncope, seizures, red/hot swollen joints, hallucinations / delusions, rashes, new allergies, unusual / excessive  bleeding, swollen lymph nodes, or new hospitalizations since the pt was last seen.  PMH/ PSH/ FamHx / Social Hx , medications and allergies reviewed and updated as appropriate; please see corresponding tab in EHR / prior notes                                        Current Outpatient Medications on File Prior to Visit  Medication Sig Dispense Refill   acetaminophen  (TYLENOL ) 500 MG tablet Take 1 tablet (500 mg total) by mouth every 6 (six) hours as needed. 30 tablet 0   albuterol  (VENTOLIN  HFA) 108 (90 Base) MCG/ACT inhaler INHALE 2 PUFFS INTO THE LUNGS EVERY 6 HOURS AS NEEDED FOR WHEEZING OR SHORTNESS OF BREATH 6.7 g  2   aspirin  EC 81 MG tablet Take 81 mg by mouth daily. Swallow whole.     bictegravir-emtricitabine -tenofovir  AF (BIKTARVY ) 50-200-25 MG TABS tablet Take 1 tablet by mouth daily. 30 tablet 11   cetirizine  (ZYRTEC ) 10 MG tablet TAKE 1 TABLET(10 MG) BY MOUTH DAILY 90 tablet 0   metoprolol  succinate (TOPROL -XL) 25 MG 24 hr tablet Take 1 tablet (25 mg total) by mouth daily. Please make overdue appt with Dr. Alveta before anymore refills. Thank you 2nd attempt 90 tablet 3   prasugrel  (EFFIENT ) 10 MG TABS tablet TAKE 1 TABLET(10 MG) BY MOUTH DAILY 30 tablet 9   promethazine -dextromethorphan (PROMETHAZINE -DM) 6.25-15 MG/5ML syrup Take 2.5 mLs by mouth 4 (four) times daily as needed for cough. 118 mL 0   rosuvastatin  (CRESTOR ) 20 MG tablet TAKE 1 TABLET(20 MG) BY MOUTH DAILY 90 tablet 1   nitroGLYCERIN  (NITROSTAT ) 0.4 MG SL tablet Place 1 tablet (0.4 mg total) under the tongue every 5 (five) minutes x 3 doses as needed for chest pain. (Patient not taking: Reported on 09/18/2024) 25 tablet 0   No current facility-administered medications on file prior to visit.    No Known Allergies  Past Medical History:  Diagnosis Date   Anemia    COPD (chronic obstructive pulmonary disease) (HCC)    Coronary artery disease    MI 2010 - dissection/plaque rupture in LAD tx with antiplt/antithrombotics  >> relook cath with resolution (no PCI performed) // Inf STEMI 3/19: OM1 30; pRCA 100 >> DES // Echo 3/19:  mild LVH, EF 40-45, inf/inf-sept HK, trivial MR   Eczema    GERD (gastroesophageal reflux disease)    History of blood clots    History of myocardial infarction 2010; 2019   History of pleural empyema    HIV infection (HCC)    Hyperlipidemia    Hypertension    Immune deficiency disorder    Ischemic cardiomyopathy 04/05/2018   Moderate persistent asthma without complication 10/29/2016   Myocardial infarction (HCC)    2009, 2019   Pneumonia    Past Surgical History:  Procedure Laterality Date   CARDIAC CATHETERIZATION  06/24/2009   EF 60%   CARDIAC CATHETERIZATION  06/17/2009   EF 45%. ANTERIOR HYPOKINESIS   CORONARY STENT INTERVENTION N/A 03/24/2018   Procedure: CORONARY STENT INTERVENTION;  Surgeon: Wonda Sharper, MD;  Location: [facility] INVASIVE CV LAB;  Service: Cardiovascular;  Laterality: N/A;   CORONARY/GRAFT ACUTE MI REVASCULARIZATION N/A 03/24/2018   Procedure: Coronary/Graft Acute MI Revascularization;  Surgeon: Wonda Sharper, MD;  Location: [facility] INVASIVE CV LAB;  Service: Cardiovascular;  Laterality: N/A;   LEFT HEART CATH AND CORONARY ANGIOGRAPHY N/A 03/24/2018   Procedure: LEFT HEART CATH AND CORONARY ANGIOGRAPHY;  Surgeon: Wonda Sharper, MD;  Location: [facility] INVASIVE CV LAB;  Service: Cardiovascular;  Laterality: N/A;   SINUS IRRIGATION  10/08/2022   Procedure: BRONCHIAL LAVAGE;  Surgeon: Alaine Vicenta NOVAK, MD;  Location: [facility] ENDOSCOPY;  Service: Cardiopulmonary;;   TOTAL HIP ARTHROPLASTY Right 09/25/2019   Procedure: RIGHT TOTAL HIP ARTHROPLASTY ANTERIOR APPROACH;  Surgeon: Jerri Kay HERO, MD;  Location: [facility] OR;  Service: Orthopedics;  Laterality: Right;   US  ECHOCARDIOGRAPHY  12/09/2009   EF 55-60%   VIDEO ASSISTED THORACOSCOPY (VATS)/ LOBECTOMY Right 04/23/2015   Procedure: VIDEO ASSISTED THORACOSCOPY (VATS)/RIGHT upper  and right middle LOBECTOMY;  Surgeon: Maude Fleeta Ochoa,  MD;  Location: [facility] OR;  Service: Thoracic;  Laterality: Right;   VIDEO BRONCHOSCOPY Bilateral 09/05/2014   Procedure: VIDEO BRONCHOSCOPY WITH FLUORO;  Surgeon:  Alm KATHEE Nett, MD;  Location: [facility] ENDOSCOPY;  Service: Cardiopulmonary;  Laterality: Bilateral;   VIDEO BRONCHOSCOPY Bilateral 04/10/2015   Procedure: VIDEO BRONCHOSCOPY WITH FLUORO;  Surgeon: Vicenta KATHEE Lennert, MD;  Location: [facility] ENDOSCOPY;  Service: Cardiopulmonary;  Laterality: Bilateral;   VIDEO BRONCHOSCOPY N/A 10/08/2022   Procedure: VIDEO BRONCHOSCOPY WITHOUT FLUORO;  Surgeon: Lennert Vicenta KATHEE, MD;  Location: [facility] ENDOSCOPY;  Service: Cardiopulmonary;  Laterality: N/A;   Social History   Socioeconomic History   Marital status: Single    Spouse name: Not on file   Number of children: 0   Years of education: Not on file   Highest education level: Not on file  Occupational History   Occupation: unemployed  Tobacco Use   Smoking status: Former    Current packs/day: 0.00    Average packs/day: 0.3 packs/day for 17.0 years (4.3 ttl pk-yrs)    Types: Cigarettes, Cigars    Start date: 03/22/1998    Quit date: 03/23/2015    Years since quitting: 9.4   Smokeless tobacco: Never  Vaping Use   Vaping status: Never Used  Substance and Sexual Activity   Alcohol use: Yes    Comment: occasional   Drug use: Yes    Frequency: 7.0 times per week    Types: Marijuana    Comment: daily   Sexual activity: Not Currently    Partners: Male    Comment: accepted condoms  Other Topics Concern   Not on file  Social History Narrative   Not on file   Social Drivers of Health   Financial Resource Strain: Low Risk  (10/07/2023)   Overall Financial Resource Strain (CARDIA)    Difficulty of Paying Living Expenses: Not hard at all  Food Insecurity: No Food Insecurity (10/07/2023)   Hunger Vital Sign    Worried About Running Out of Food in the Last Year: Never true    Ran Out of Food in the Last Year: Never true  Transportation Needs: No  Transportation Needs (10/07/2023)   PRAPARE - Administrator, Civil Service (Medical): No    Lack of Transportation (Non-Medical): No  Physical Activity: Sufficiently Active (10/07/2023)   Exercise Vital Sign    Days of Exercise per Week: 3 days    Minutes of Exercise per Session: 60 min  Stress: No Stress Concern Present (10/07/2023)   [employer] of Occupational Health - Occupational Stress Questionnaire    Feeling of Stress : Not at all  Social Connections: Moderately Isolated (10/07/2023)   Social Connection and Isolation Panel    Frequency of Communication with Friends and Family: More than three times a week    Frequency of Social Gatherings with Friends and Family: More than three times a week    Attends Religious Services: More than 4 times per year    Active Member of [employer] or Organizations: No    Attends Banker Meetings: Never    Marital Status: Never married  Intimate Partner Violence: Not At Risk (10/07/2023)   Humiliation, Afraid, Rape, and Kick questionnaire    Fear of Current or Ex-Partner: No    Emotionally Abused: No    Physically Abused: No    Sexually Abused: No   Family History  Problem Relation Age of Onset   Diabetes Mother    Eczema Mother    Asthma Mother    COPD Mother    Hypertension Mother    Hyperlipidemia Mother    Stroke Mother    Heart disease Mother  Sleep apnea Mother    Liver cancer Father    Asthma Brother    Diabetes Maternal Grandmother    Heart failure Maternal Grandmother    Colon cancer Neg Hx    Rectal cancer Neg Hx    Stomach cancer Neg Hx    Vitals  BP 109/70   Pulse (!) 105   Temp 98.8 F (37.1 C) (Oral)   Ht 5' 4 (1.626 m)   Wt 136 lb (61.7 kg)   SpO2 95%   BMI 23.34 kg/m    Examination  Gen: no acute distress HEENT: [place]/AT, no scleral icterus, no pale conjunctivae, hearing normal, oral mucosa moist, no thrush, no enlarged tonsils, pharyngeal erythema Neck: Supple Cardio: Regular  rate and rhythm Resp: Pulmonary effort normal in room air GI: nondistended GU: deferred  Musc: Extremities: No pedal edema Skin: No rashes, pea size nodule in the rt forearm, fixed, no signs of infection ( no overlying erythema, warmth, tenderness or fluctuance) Neuro: grossly non focal , awake, alert and oriented * 3  Psych: Calm, cooperative  Lab Results HIV 1 RNA Quant  Date Value  05/09/2024 63 copies/mL (H)  04/26/2023 32 Copies/mL (H)  03/25/2023 39 Copies/mL (H)   CD4 T Cell Abs (/uL)  Date Value  04/26/2023 1,098  11/09/2022 1,182  05/26/2022 789   Lab Results  Component Value Date   HIV1GENOSEQ  RT, Seq^REPORT 03/22/2014   Lab Results  Component Value Date   WBC 9.8 05/09/2024   HGB 14.8 05/09/2024   HCT 42.9 05/09/2024   MCV 95.3 05/09/2024   PLT 213 05/09/2024    Lab Results  Component Value Date   CREATININE 1.03 05/09/2024   BUN 11 05/09/2024   NA 139 05/09/2024   K 3.7 05/09/2024   CL 105 05/09/2024   CO2 27 05/09/2024   Lab Results  Component Value Date   ALT 15 05/09/2024   AST 19 05/09/2024   ALKPHOS 90 07/29/2023   BILITOT 0.5 05/09/2024    Lab Results  Component Value Date   CHOL 152 05/09/2024   TRIG 90 05/09/2024   HDL 61 05/09/2024   LDLCALC 74 05/09/2024   No results found for: HAV Lab Results  Component Value Date   HEPBSAG NEGATIVE 03/22/2014   Lab Results  Component Value Date   HCVAB NEGATIVE 01/28/2017   Lab Results  Component Value Date   CHLAMYDIAWP Negative 04/26/2023   N Negative 04/26/2023   No results found for: GCPROBEAPT Lab Results  Component Value Date   QUANTGOLD NEGATIVE 09/01/2014    Health Maintenance: Immunization History  Administered Date(s) Administered   HPV 9-valent 08/08/2018, 09/05/2018, 02/03/2019   Hepatitis A, Adult 09/26/2014, 03/14/2015   Hepatitis B, ADULT 09/26/2014, 01/23/2015, 03/14/2015   Influenza,inj,Quad PF,6+ Mos 09/26/2014, 10/16/2015, 10/08/2016, 10/06/2017,  11/07/2018, 10/02/2019, 09/19/2020, 11/13/2021, 08/24/2022   Meningococcal Mcv4o 08/08/2018, 11/07/2018   PFIZER Comirnaty(Gray Top)Covid-19 Tri-Sucrose Vaccine 08/27/2021   PFIZER(Purple Top)SARS-COV-2 Vaccination 04/03/2020, 04/24/2020, 11/08/2020   PNEUMOCOCCAL CONJUGATE-20 02/19/2022   Pneumococcal Conjugate-13 05/05/2018   Pneumococcal Polysaccharide-23 09/26/2014   Tdap 10/28/2016    Assessment/Plan: # HIV   - continue Biktarvy  - 5/13 labs reviewed and discussed   - labs today  - Fu in 4 months   # Sensation of bladder pressure  - Unlikely to be UTI  - Amb referral to Urology   # HTN/HLD/COPD/CAD - on meds including statin  - Fu with PCP   # STD Screening  - no acute concerns  -  Urine, oral and anal GC + RPR  # Immunization  -  Flu vaccine today   #Health maintenance - Hep B surface ab to check immunity  - 05/09/24 Anal Pap ASCUS - reminded to fu with colorectal surgery  - Dental Care has been discussed  - 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 Patients questions were addressed and answered. Safe sex counseling done.  I spent 30 minutes involved in face-to-face and non-face-to-face activities for this patient on the day of the visit. Professional time spent includes the following activities: Preparing to see the patient (review of tests), Obtaining and/or reviewing separately obtained history (ED note 7/28), Performing a medically appropriate examination and evaluation , Ordering medications/tests/vaccine, referring and communicating with other health care professionals, Documenting clinical information in the EMR, Independently interpreting results (not separately reported), Communicating results to the patient/family, Counseling and educating the patient/family and Care coordination (not separately reported).   Of note, portions of this note may have been created with voice recognition  software. While this note has been edited for accuracy, occasional wrong-word or 'sound-a-like' substitutions may have occurred due to the inherent limitations of voice recognition software.   Electronically signed by:  Annalee Orem, MD Infectious Disease Physician [facility] [address] [place], [place] [address] Phone: [phone]  Fax: [phone]

## 2024-09-19 LAB — URINE CYTOLOGY ANCILLARY ONLY
Chlamydia: NEGATIVE
Comment: NEGATIVE
Comment: NORMAL
Neisseria Gonorrhea: NEGATIVE

## 2024-09-19 LAB — CYTOLOGY, (ORAL, ANAL, URETHRAL) ANCILLARY ONLY
Chlamydia: NEGATIVE
Chlamydia: NEGATIVE
Comment: NEGATIVE
Comment: NEGATIVE
Comment: NORMAL
Comment: NORMAL
Neisseria Gonorrhea: NEGATIVE
Neisseria Gonorrhea: NEGATIVE

## 2024-09-21 ENCOUNTER — Ambulatory Visit: Payer: Self-pay | Admitting: Infectious Diseases

## 2024-09-21 ENCOUNTER — Other Ambulatory Visit: Payer: Self-pay | Admitting: Infectious Diseases

## 2024-09-21 LAB — COMPREHENSIVE METABOLIC PANEL WITH GFR
AG Ratio: 1.1 (calc) (ref 1.0–2.5)
ALT: 13 U/L (ref 9–46)
AST: 16 U/L (ref 10–40)
Albumin: 4.1 g/dL (ref 3.6–5.1)
Alkaline phosphatase (APISO): 85 U/L (ref 36–130)
BUN: 12 mg/dL (ref 7–25)
CO2: 27 mmol/L (ref 20–32)
Calcium: 9.1 mg/dL (ref 8.6–10.3)
Chloride: 102 mmol/L (ref 98–110)
Creat: 1.04 mg/dL (ref 0.60–1.29)
Globulin: 3.6 g/dL (ref 1.9–3.7)
Glucose, Bld: 95 mg/dL (ref 65–99)
Potassium: 3.7 mmol/L (ref 3.5–5.3)
Sodium: 136 mmol/L (ref 135–146)
Total Bilirubin: 0.5 mg/dL (ref 0.2–1.2)
Total Protein: 7.7 g/dL (ref 6.1–8.1)
eGFR: 91 mL/min/1.73m2 (ref >=60)

## 2024-09-21 LAB — HEPATITIS B SURFACE ANTIBODY,QUALITATIVE: Hep B S Ab: REACTIVE — AB

## 2024-09-21 LAB — RPR TITER: RPR Titer: 1:128 {titer} — ABNORMAL HIGH

## 2024-09-21 LAB — HIV RNA, RTPCR W/R GT (RTI, PI,INT)
HIV 1 RNA Quant: 39 {copies}/mL — ABNORMAL HIGH
HIV-1 RNA Quant, Log: 1.59 {Log_copies}/mL — ABNORMAL HIGH

## 2024-09-21 LAB — RPR: RPR Ser Ql: REACTIVE — AB

## 2024-09-21 LAB — T PALLIDUM AB: T Pallidum Abs: POSITIVE — AB

## 2024-09-21 MED ORDER — DOXYCYCLINE HYCLATE 100 MG PO TABS: 100.0000 mg | ORAL_TABLET | Freq: Two times a day (BID) | ORAL | 0 refills | Status: AC

## 2024-09-21 NOTE — Telephone Encounter (Signed)
 Called [facility] to review results and need for treatment, no answer. Left HIPAA compliant voicemail stating that MyChart message would be sent and to call with any questions.   Ladainian Therien, BSN, RN

## 2024-10-11 ENCOUNTER — Ambulatory Visit: Payer: 59

## 2024-12-12 ENCOUNTER — Ambulatory Visit (INDEPENDENT_AMBULATORY_CARE_PROVIDER_SITE_OTHER)

## 2024-12-12 ENCOUNTER — Encounter: Payer: Self-pay | Admitting: Emergency Medicine

## 2024-12-12 ENCOUNTER — Ambulatory Visit (INDEPENDENT_AMBULATORY_CARE_PROVIDER_SITE_OTHER): Admitting: Emergency Medicine

## 2024-12-12 VITALS — BP 118/80 | HR 108 | Ht 64.0 in | Wt 138.0 lb

## 2024-12-12 VITALS — BP 118/80 | HR 108 | Temp 98.3°F | Ht 64.0 in | Wt 138.0 lb

## 2024-12-12 DIAGNOSIS — Z0001 Encounter for general adult medical examination with abnormal findings: Secondary | ICD-10-CM

## 2024-12-12 DIAGNOSIS — Z125 Encounter for screening for malignant neoplasm of prostate: Secondary | ICD-10-CM

## 2024-12-12 DIAGNOSIS — R3989 Other symptoms and signs involving the genitourinary system: Secondary | ICD-10-CM | POA: Diagnosis not present

## 2024-12-12 DIAGNOSIS — J439 Emphysema, unspecified: Secondary | ICD-10-CM

## 2024-12-12 DIAGNOSIS — Z21 Asymptomatic human immunodeficiency virus [HIV] infection status: Secondary | ICD-10-CM

## 2024-12-12 DIAGNOSIS — Z130 Encounter for screening for diseases of the blood and blood-forming organs and certain disorders involving the immune mechanism: Secondary | ICD-10-CM | POA: Diagnosis not present

## 2024-12-12 DIAGNOSIS — I25118 Atherosclerotic heart disease of native coronary artery with other forms of angina pectoris: Secondary | ICD-10-CM

## 2024-12-12 DIAGNOSIS — I255 Ischemic cardiomyopathy: Secondary | ICD-10-CM

## 2024-12-12 DIAGNOSIS — F1210 Cannabis abuse, uncomplicated: Secondary | ICD-10-CM

## 2024-12-12 DIAGNOSIS — Z0000 Encounter for general adult medical examination without abnormal findings: Secondary | ICD-10-CM

## 2024-12-12 DIAGNOSIS — E7800 Pure hypercholesterolemia, unspecified: Secondary | ICD-10-CM | POA: Diagnosis not present

## 2024-12-12 NOTE — Assessment & Plan Note (Signed)
 Clinically stable Continue daily baby aspirin  and prasugrel  10 mg daily

## 2024-12-12 NOTE — Assessment & Plan Note (Signed)
History of anterior MI in 2010 with heavy thrombus burden, treated medically with antiplatelet and antithrombotic therapy.  Status post inferior STEMI in 2019 treated DES to the RCA.  He has been maintained on long-term dual antiplatelet therapy.  He is not having chest discomfort to suggest angina.  Continue ASA 81 mg daily, Effient 10 mg daily, Toprol-XL 25 mg daily, Crestor.  Follow-up in 6 months.

## 2024-12-12 NOTE — Assessment & Plan Note (Signed)
 Cardiovascular and cancer risks associated with smoking discussed. Smoking cessation advice given.

## 2024-12-12 NOTE — Assessment & Plan Note (Signed)
Clinically stable.  No recent need for albuterol inhaler

## 2024-12-12 NOTE — Progress Notes (Addendum)
 Chief Complaint  Patient presents with   Medicare Wellness     Subjective:   Rodney Walker is a 43 y.o. male who presents for a Medicare Annual Wellness Visit.  Visit info / Clinical Intake: Medicare Wellness Visit Type:: Subsequent Annual Wellness Visit Persons participating in visit and providing information:: patient Medicare Wellness Visit Mode:: In-person (required for WTM) Interpreter Needed?: No Pre-visit prep was completed: yes AWV questionnaire completed by patient prior to visit?: yes Date:: 12/12/24 Living arrangements:: (!) lives alone Patient's Overall Health Status Rating: good Typical amount of pain: none Does pain affect daily life?: no Are you currently prescribed opioids?: no  Dietary Habits and Nutritional Risks How many meals a day?: 3 Eats fruit and vegetables daily?: yes Most meals are obtained by: preparing own meals; eating out In the last 2 weeks, have you had any of the following?: none Diabetic:: no  Functional Status Activities of Daily Living (to include ambulation/medication): Independent Ambulation: Independent Medication Administration: Independent Home Management (perform basic housework or laundry): Independent Manage your own finances?: yes Primary transportation is: driving Concerns about vision?: no *vision screening is required for WTM* Concerns about hearing?: no  Fall Screening Falls in the past year?: 0 Number of falls in past year: 0 Was there an injury with Fall?: 0 Fall Risk Category Calculator: 0 Patient Fall Risk Level: Low Fall Risk  Fall Risk Patient at Risk for Falls Due to: No Fall Risks Fall risk Follow up: Falls evaluation completed; Falls prevention discussed  Home and Transportation Safety: All rugs have non-skid backing?: N/A, no rugs All stairs or steps have railings?: N/A, no stairs Grab bars in the bathtub or shower?: (!) no Have non-skid surface in bathtub or shower?: yes Good home  lighting?: yes Regular seat belt use?: yes Hospital stays in the last year:: no  Cognitive Assessment Difficulty concentrating, remembering, or making decisions? : no Will 6CIT or Mini Cog be Completed: yes What year is it?: 0 points What month is it?: 0 points Give patient an address phrase to remember (5 components): [address] [place], [place] About what time is it?: 0 points Count backwards from 20 to 1: 0 points Say the months of the year in reverse: 0 points Repeat the address phrase from earlier: 0 points 6 CIT Score: 0 points  Advance Directives (For Healthcare) Does Patient Have a Medical Advance Directive?: No Would patient like information on creating a medical advance directive?: No - Patient declined  Reviewed/Updated  Reviewed/Updated: Reviewed All (Medical, Surgical, Family, Medications, Allergies, Care Teams, Patient Goals)    Allergies (verified) Patient has no known allergies.   Current Medications (verified) Outpatient Encounter Medications as of 12/12/2024  Medication Sig   acetaminophen  (TYLENOL ) 500 MG tablet Take 1 tablet (500 mg total) by mouth every 6 (six) hours as needed.   albuterol  (VENTOLIN  HFA) 108 (90 Base) MCG/ACT inhaler INHALE 2 PUFFS INTO THE LUNGS EVERY 6 HOURS AS NEEDED FOR WHEEZING OR SHORTNESS OF BREATH   aspirin  EC 81 MG tablet Take 81 mg by mouth daily. Swallow whole.   bictegravir-emtricitabine -tenofovir  AF (BIKTARVY ) 50-200-25 MG TABS tablet Take 1 tablet by mouth daily.   cetirizine  (ZYRTEC ) 10 MG tablet TAKE 1 TABLET(10 MG) BY MOUTH DAILY   metoprolol  succinate (TOPROL -XL) 25 MG 24 hr tablet Take 1 tablet (25 mg total) by mouth daily. Please make overdue appt with Dr. Alveta before anymore refills. Thank you 2nd attempt   nitroGLYCERIN  (NITROSTAT ) 0.4 MG SL tablet Place 1 tablet (  0.4 mg total) under the tongue every 5 (five) minutes x 3 doses as needed for chest pain.   prasugrel  (EFFIENT ) 10 MG TABS tablet TAKE 1 TABLET(10 MG) BY  MOUTH DAILY   rosuvastatin  (CRESTOR ) 20 MG tablet TAKE 1 TABLET(20 MG) BY MOUTH DAILY   promethazine -dextromethorphan (PROMETHAZINE -DM) 6.25-15 MG/5ML syrup Take 2.5 mLs by mouth 4 (four) times daily as needed for cough. (Patient not taking: Reported on 12/12/2024)   No facility-administered encounter medications on file as of 12/12/2024.    History: Past Medical History:  Diagnosis Date   Anemia    COPD (chronic obstructive pulmonary disease) (HCC)    Coronary artery disease    MI 2010 - dissection/plaque rupture in LAD tx with antiplt/antithrombotics >> relook cath with resolution (no PCI performed) // Inf STEMI 3/19: OM1 30; pRCA 100 >> DES // Echo 3/19:  mild LVH, EF 40-45, inf/inf-sept HK, trivial MR   Eczema    GERD (gastroesophageal reflux disease)    History of blood clots    History of myocardial infarction 2010; 2019   History of pleural empyema    HIV infection (HCC)    Hyperlipidemia    Hypertension    Immune deficiency disorder    Ischemic cardiomyopathy 04/05/2018   Moderate persistent asthma without complication 10/29/2016   Myocardial infarction (HCC)    2009, 2019   Pneumonia    Past Surgical History:  Procedure Laterality Date   CARDIAC CATHETERIZATION  06/24/2009   EF 60%   CARDIAC CATHETERIZATION  06/17/2009   EF 45%. ANTERIOR HYPOKINESIS   CORONARY STENT INTERVENTION N/A 03/24/2018   Procedure: CORONARY STENT INTERVENTION;  Surgeon: Wonda Sharper, MD;  Location: [facility] INVASIVE CV LAB;  Service: Cardiovascular;  Laterality: N/A;   CORONARY/GRAFT ACUTE MI REVASCULARIZATION N/A 03/24/2018   Procedure: Coronary/Graft Acute MI Revascularization;  Surgeon: Wonda Sharper, MD;  Location: [facility] INVASIVE CV LAB;  Service: Cardiovascular;  Laterality: N/A;   LEFT HEART CATH AND CORONARY ANGIOGRAPHY N/A 03/24/2018   Procedure: LEFT HEART CATH AND CORONARY ANGIOGRAPHY;  Surgeon: Wonda Sharper, MD;  Location: [facility] INVASIVE CV LAB;  Service: Cardiovascular;  Laterality: N/A;    SINUS IRRIGATION  10/08/2022   Procedure: BRONCHIAL LAVAGE;  Surgeon: Alaine Vicenta NOVAK, MD;  Location: [facility] ENDOSCOPY;  Service: Cardiopulmonary;;   TOTAL HIP ARTHROPLASTY Right 09/25/2019   Procedure: RIGHT TOTAL HIP ARTHROPLASTY ANTERIOR APPROACH;  Surgeon: Jerri Kay HERO, MD;  Location: [facility] OR;  Service: Orthopedics;  Laterality: Right;   US  ECHOCARDIOGRAPHY  12/09/2009   EF 55-60%   VIDEO ASSISTED THORACOSCOPY (VATS)/ LOBECTOMY Right 04/23/2015   Procedure: VIDEO ASSISTED THORACOSCOPY (VATS)/RIGHT upper  and right middle LOBECTOMY;  Surgeon: Maude Fleeta Ochoa, MD;  Location: [facility] OR;  Service: Thoracic;  Laterality: Right;   VIDEO BRONCHOSCOPY Bilateral 09/05/2014   Procedure: VIDEO BRONCHOSCOPY WITH FLUORO;  Surgeon: Alm NOVAK Nett, MD;  Location: [facility] ENDOSCOPY;  Service: Cardiopulmonary;  Laterality: Bilateral;   VIDEO BRONCHOSCOPY Bilateral 04/10/2015   Procedure: VIDEO BRONCHOSCOPY WITH FLUORO;  Surgeon: Vicenta NOVAK Alaine, MD;  Location: [facility] ENDOSCOPY;  Service: Cardiopulmonary;  Laterality: Bilateral;   VIDEO BRONCHOSCOPY N/A 10/08/2022   Procedure: VIDEO BRONCHOSCOPY WITHOUT FLUORO;  Surgeon: Alaine Vicenta NOVAK, MD;  Location: [facility] ENDOSCOPY;  Service: Cardiopulmonary;  Laterality: N/A;   Family History  Problem Relation Age of Onset   Diabetes Mother    Eczema Mother    Asthma Mother    COPD Mother    Hypertension Mother    Hyperlipidemia Mother    Stroke  Mother    Heart disease Mother    Sleep apnea Mother    Liver cancer Father    Asthma Brother    Diabetes Maternal Grandmother    Heart failure Maternal Grandmother    Colon cancer Neg Hx    Rectal cancer Neg Hx    Stomach cancer Neg Hx    Social History   Occupational History   Occupation: unemployed  Tobacco Use   Smoking status: Former    Current packs/day: 0.00    Average packs/day: 0.3 packs/day for 17.0 years (4.3 ttl pk-yrs)    Types: Cigarettes, Cigars    Start date: 03/22/1998    Quit date: 03/23/2015    Years since  quitting: 9.7   Smokeless tobacco: Never  Vaping Use   Vaping status: Never Used  Substance and Sexual Activity   Alcohol use: Not Currently    Comment: occasional   Drug use: Yes    Frequency: 7.0 times per week    Types: Marijuana    Comment: daily   Sexual activity: Not Currently    Partners: Male    Comment: accepted condoms   Tobacco Counseling Counseling given: Yes  SDOH Screenings   Food Insecurity: No Food Insecurity (12/12/2024)  Housing: Unknown (12/12/2024)  Transportation Needs: No Transportation Needs (12/12/2024)  Utilities: Not At Risk (12/12/2024)  Alcohol Screen: Low Risk (09/18/2022)  Depression (PHQ2-9): Low Risk (12/12/2024)  Financial Resource Strain: Low Risk (10/07/2023)  Physical Activity: Sufficiently Active (12/12/2024)  Social Connections: Moderately Isolated (12/12/2024)  Stress: No Stress Concern Present (12/12/2024)  Tobacco Use: Medium Risk (12/12/2024)  Health Literacy: Adequate Health Literacy (12/12/2024)   See flowsheets for full screening details  Depression Screen PHQ 2 & 9 Depression Scale- Over the past 2 weeks, how often have you been bothered by any of the following problems? Little interest or pleasure in doing things: 0 Feeling down, depressed, or hopeless (PHQ Adolescent also includes...irritable): 0 PHQ-2 Total Score: 0 Trouble falling or staying asleep, or sleeping too much: 1 (gets about 7hrs of sleep) Feeling tired or having little energy: 0 Poor appetite or overeating (PHQ Adolescent also includes...weight loss): 0 Feeling bad about yourself - or that you are a failure or have let yourself or your family down: 0 Trouble concentrating on things, such as reading the newspaper or watching television (PHQ Adolescent also includes...like school work): 0 Moving or speaking so slowly that other people could have noticed. Or the opposite - being so fidgety or restless that you have been moving around a lot more than usual:  0 Thoughts that you would be better off dead, or of hurting yourself in some way: 0 PHQ-9 Total Score: 1 If you checked off any problems, how difficult have these problems made it for you to do your work, take care of things at home, or get along with other people?: Not difficult at all  Depression Treatment Depression Interventions/Treatment : EYV7-0 Score <4 Follow-up Not Indicated     Goals Addressed               This Visit's Progress     Patient Stated (pt-stated)        Patient stated he plans to continue walking             Objective:    Today's Vitals   12/12/24 1515 12/12/24 1600  BP: 118/80   Pulse: (!) 108   SpO2: 93% 95%  Weight: 138 lb (62.6 kg)   Height: 5' 4 (1.626  m)    Body mass index is 23.69 kg/m.  Hearing/Vision screen Hearing Screening - Comments:: Denies hearing difficulties   Vision Screening - Comments:: Denies vision concerns - referral to [facility]  Immunizations and Health Maintenance Health Maintenance  Topic Date Due   Hepatitis B Vaccines 19-59 Average Risk (3 of 3 - 19+ 3-dose series) 05/09/2015   COVID-19 Vaccine (5 - 2025-26 season) 08/28/2024   Medicare Annual Wellness (AWV)  12/12/2025   DTaP/Tdap/Td (2 - Td or Tdap) 10/28/2026   Pneumococcal Vaccine  Completed   Influenza Vaccine  Completed   HPV VACCINES  Completed   Hepatitis C Screening  Completed   HIV Screening  Completed   Meningococcal B Vaccine  Aged Out        Assessment/Plan:  This is a routine wellness examination for Rodney Walker.  Patient Care Team: Purcell Emil Schanz, MD as PCP - General (Internal Medicine) [name], Aleene PARAS, MD (Inactive) as PCP - Cardiology (Cardiology) Luiz Channel, MD as Consulting Physician (Infectious Diseases) Luiz Channel, MD as Consulting Physician (Infectious Diseases)  I have personally reviewed and noted the following in the patients chart:   Medical and social history Use of alcohol, tobacco or  illicit drugs  Current medications and supplements including opioid prescriptions. Functional ability and status Nutritional status Physical activity Advanced directives List of other physicians Hospitalizations, surgeries, and ER visits in previous 12 months Vitals Screenings to include cognitive, depression, and falls Referrals and appointments  No orders of the defined types were placed in this encounter.  In addition, I have reviewed and discussed with patient certain preventive protocols, quality metrics, and best practice recommendations. A written personalized care plan for preventive services as well as general preventive health recommendations were provided to patient.   Verdie CHRISTELLA Saba, CMA   12/12/2024   Return in 1 year (on 12/12/2025).  After Visit Summary: (In Person-Declined) Patient declined AVS at this time.  Nurse Notes: Appointment(s) made: (scheduled 6-mth f/u appt w/PCP and 2026 AWV/CPE appts)

## 2024-12-12 NOTE — Assessment & Plan Note (Signed)
Clinically stable. Continues Biktarvy 1 tablet daily

## 2024-12-12 NOTE — Assessment & Plan Note (Signed)
 Chronic problem.  No complications. Differential diagnosis discussed Recommend blood work and urinalysis

## 2024-12-12 NOTE — Patient Instructions (Signed)
 Health Maintenance, Male  Adopting a healthy lifestyle and getting preventive care are important in promoting health and wellness. Ask your health care provider about:  The right schedule for you to have regular tests and exams.  Things you can do on your own to prevent diseases and keep yourself healthy.  What should I know about diet, weight, and exercise?  Eat a healthy diet    Eat a diet that includes plenty of vegetables, fruits, low-fat dairy products, and lean protein.  Do not eat a lot of foods that are high in solid fats, added sugars, or sodium.  Maintain a healthy weight  Body mass index (BMI) is a measurement that can be used to identify possible weight problems. It estimates body fat based on height and weight. Your health care provider can help determine your BMI and help you achieve or maintain a healthy weight.  Get regular exercise  Get regular exercise. This is one of the most important things you can do for your health. Most adults should:  Exercise for at least 150 minutes each week. The exercise should increase your heart rate and make you sweat (moderate-intensity exercise).  Do strengthening exercises at least twice a week. This is in addition to the moderate-intensity exercise.  Spend less time sitting. Even light physical activity can be beneficial.  Watch cholesterol and blood lipids  Have your blood tested for lipids and cholesterol at 44 years of age, then have this test every 5 years.  You may need to have your cholesterol levels checked more often if:  Your lipid or cholesterol levels are high.  You are older than 44 years of age.  You are at high risk for heart disease.  What should I know about cancer screening?  Many types of cancers can be detected early and may often be prevented. Depending on your health history and family history, you may need to have cancer screening at various ages. This may include screening for:  Colorectal cancer.  Prostate cancer.  Skin cancer.  Lung  cancer.  What should I know about heart disease, diabetes, and high blood pressure?  Blood pressure and heart disease  High blood pressure causes heart disease and increases the risk of stroke. This is more likely to develop in people who have high blood pressure readings or are overweight.  Talk with your health care provider about your target blood pressure readings.  Have your blood pressure checked:  Every 3-5 years if you are 24-52 years of age.  Every year if you are 3 years old or older.  If you are between the ages of 60 and 72 and are a current or former smoker, ask your health care provider if you should have a one-time screening for abdominal aortic aneurysm (AAA).  Diabetes  Have regular diabetes screenings. This checks your fasting blood sugar level. Have the screening done:  Once every three years after age 66 if you are at a normal weight and have a low risk for diabetes.  More often and at a younger age if you are overweight or have a high risk for diabetes.  What should I know about preventing infection?  Hepatitis B  If you have a higher risk for hepatitis B, you should be screened for this virus. Talk with your health care provider to find out if you are at risk for hepatitis B infection.  Hepatitis C  Blood testing is recommended for:  Everyone born from [birth year] through 1966.  Anyone  with known risk factors for hepatitis C.  Sexually transmitted infections (STIs)  You should be screened each year for STIs, including gonorrhea and chlamydia, if:  You are sexually active and are younger than 44 years of age.  You are older than 44 years of age and your health care provider tells you that you are at risk for this type of infection.  Your sexual activity has changed since you were last screened, and you are at increased risk for chlamydia or gonorrhea. Ask your health care provider if you are at risk.  Ask your health care provider about whether you are at high risk for HIV. Your health care provider  may recommend a prescription medicine to help prevent HIV infection. If you choose to take medicine to prevent HIV, you should first get tested for HIV. You should then be tested every 3 months for as long as you are taking the medicine.  Follow these instructions at home:  Alcohol use  Do not drink alcohol if your health care provider tells you not to drink.  If you drink alcohol:  Limit how much you have to 0-2 drinks a day.  Know how much alcohol is in your drink. In the [place] one drink equals one 12 oz bottle of beer (355 mL), one 5 oz glass of wine (148 mL), or one 1 oz glass of hard liquor (44 mL).  Lifestyle  Do not use any products that contain nicotine or tobacco. These products include cigarettes, chewing tobacco, and vaping devices, such as e-cigarettes. If you need help quitting, ask your health care provider.  Do not use street drugs.  Do not share needles.  Ask your health care provider for help if you need support or information about quitting drugs.  General instructions  Schedule regular health, dental, and eye exams.  Stay current with your vaccines.  Tell your health care provider if:  You often feel depressed.  You have ever been abused or do not feel safe at home.  Summary  Adopting a healthy lifestyle and getting preventive care are important in promoting health and wellness.  Follow your health care provider's instructions about healthy diet, exercising, and getting tested or screened for diseases.  Follow your health care provider's instructions on monitoring your cholesterol and blood pressure.  This information is not intended to replace advice given to you by your health care provider. Make sure you discuss any questions you have with your health care provider.  Document Revised: 05/05/2021 Document Reviewed: 05/05/2021  Elsevier Patient Education  2024 [employer].

## 2024-12-12 NOTE — Assessment & Plan Note (Signed)
Chronic stable condition. Continue rosuvastatin 20 mg daily.  

## 2024-12-12 NOTE — Progress Notes (Signed)
 Rodney Walker 43 y.o.   Chief Complaint  Patient presents with   Annual Exam    Pt states that when he tried to run he has pressure in his groin area he feels that he will need to either urinate or make a bowel movement     HISTORY OF PRESENT ILLNESS: This is a 43 y.o. male here for annual exam and follow-up on multiple chronic medical conditions Still having suprapubic/bladder pressure after he exercises or runs for a while No other associated symptoms No other complaints or medical concerns today.  HPI   Prior to Admission medications  Medication Sig Start Date End Date Taking? Authorizing Provider  acetaminophen  (TYLENOL ) 500 MG tablet Take 1 tablet (500 mg total) by mouth every 6 (six) hours as needed. 07/04/23  Yes Nivia Colon, PA-C  albuterol  (VENTOLIN  HFA) 108 (90 Base) MCG/ACT inhaler INHALE 2 PUFFS INTO THE LUNGS EVERY 6 HOURS AS NEEDED FOR WHEEZING OR SHORTNESS OF BREATH 05/16/24  Yes Savina Olshefski, Emil Schanz, MD  aspirin  EC 81 MG tablet Take 81 mg by mouth daily. Swallow whole.   Yes [provider]  bictegravir-emtricitabine -tenofovir  AF (BIKTARVY ) 50-200-25 MG TABS tablet Take 1 tablet by mouth daily. 05/09/24  Yes Manandhar, Sabina, MD  cetirizine  (ZYRTEC ) 10 MG tablet TAKE 1 TABLET(10 MG) BY MOUTH DAILY 09/23/23  Yes Calone, Gregory D, FNP  metoprolol  succinate (TOPROL -XL) 25 MG 24 hr tablet Take 1 tablet (25 mg total) by mouth daily. Please make overdue appt with Dr. Alveta before anymore refills. Thank you 2nd attempt 03/07/24  Yes [name], Aleene PARAS, MD  nitroGLYCERIN  (NITROSTAT ) 0.4 MG SL tablet Place 1 tablet (0.4 mg total) under the tongue every 5 (five) minutes x 3 doses as needed for chest pain. 06/27/20  Yes [name], Aleene PARAS, MD  prasugrel  (EFFIENT ) 10 MG TABS tablet TAKE 1 TABLET(10 MG) BY MOUTH DAILY 04/14/24  Yes Weaver, Scott T, PA-C  rosuvastatin  (CRESTOR ) 20 MG tablet TAKE 1 TABLET(20 MG) BY MOUTH DAILY 07/27/24  Yes Weaver, Scott T, PA-C   promethazine -dextromethorphan (PROMETHAZINE -DM) 6.25-15 MG/5ML syrup Take 2.5 mLs by mouth 4 (four) times daily as needed for cough. 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   Hyperlipidemia 04/05/2018   Ischemic cardiomyopathy 04/05/2018   History of acute inferior wall MI 03/24/2018   Marijuana abuse, continuous 10/29/2016   Anemia 05/03/2015   Pulmonary emphysema (HCC)    HIV (human immunodeficiency virus infection) (HCC)    Tobacco abuse 03/21/2015   Pulmonary nodule 08/31/2014   AIDS (HCC) 03/22/2014   CAD (coronary artery disease) 11/13/2011    Past Medical History:  Diagnosis Date   Anemia    COPD (chronic obstructive pulmonary disease) (HCC)    Coronary artery disease    MI 2010 - dissection/plaque rupture in LAD tx with antiplt/antithrombotics >> relook cath with resolution (no PCI performed) // Inf STEMI 3/19: OM1 30; pRCA 100 >> DES // Echo 3/19:  mild LVH, EF 40-45, inf/inf-sept HK, trivial MR   Eczema    GERD (gastroesophageal reflux disease)    History of blood clots  History of myocardial infarction 2010; 2019   History of pleural empyema    HIV infection (HCC)    Hyperlipidemia    Hypertension    Immune deficiency disorder    Ischemic cardiomyopathy 04/05/2018   Moderate persistent asthma without complication 10/29/2016   Myocardial infarction [facility])    2009, 2019   Pneumonia     Past Surgical History:  Procedure Laterality Date   CARDIAC CATHETERIZATION  06/24/2009   EF 60%   CARDIAC CATHETERIZATION  06/17/2009   EF 45%. ANTERIOR  HYPOKINESIS   CORONARY STENT INTERVENTION N/A 03/24/2018   Procedure: CORONARY STENT INTERVENTION;  Surgeon: Wonda Sharper, MD;  Location: [facility] INVASIVE CV LAB;  Service: Cardiovascular;  Laterality: N/A;   CORONARY/GRAFT ACUTE MI REVASCULARIZATION N/A 03/24/2018   Procedure: Coronary/Graft Acute MI Revascularization;  Surgeon: Wonda Sharper, MD;  Location: [facility] INVASIVE CV LAB;  Service: Cardiovascular;  Laterality: N/A;   LEFT HEART CATH AND CORONARY ANGIOGRAPHY N/A 03/24/2018   Procedure: LEFT HEART CATH AND CORONARY ANGIOGRAPHY;  Surgeon: Wonda Sharper, MD;  Location: [facility] INVASIVE CV LAB;  Service: Cardiovascular;  Laterality: N/A;   SINUS IRRIGATION  10/08/2022   Procedure: BRONCHIAL LAVAGE;  Surgeon: Alaine Vicenta NOVAK, MD;  Location: [facility] ENDOSCOPY;  Service: Cardiopulmonary;;   TOTAL HIP ARTHROPLASTY Right 09/25/2019   Procedure: RIGHT TOTAL HIP ARTHROPLASTY ANTERIOR APPROACH;  Surgeon: Jerri Kay HERO, MD;  Location: [facility] OR;  Service: Orthopedics;  Laterality: Right;   US  ECHOCARDIOGRAPHY  12/09/2009   EF 55-60%   VIDEO ASSISTED THORACOSCOPY (VATS)/ LOBECTOMY Right 04/23/2015   Procedure: VIDEO ASSISTED THORACOSCOPY (VATS)/RIGHT upper  and right middle LOBECTOMY;  Surgeon: Maude Fleeta Ochoa, MD;  Location: [facility] OR;  Service: Thoracic;  Laterality: Right;   VIDEO BRONCHOSCOPY Bilateral 09/05/2014   Procedure: VIDEO BRONCHOSCOPY WITH FLUORO;  Surgeon: Alm NOVAK Nett, MD;  Location: [facility] ENDOSCOPY;  Service: Cardiopulmonary;  Laterality: Bilateral;   VIDEO BRONCHOSCOPY Bilateral 04/10/2015   Procedure: VIDEO BRONCHOSCOPY WITH FLUORO;  Surgeon: Vicenta NOVAK Alaine, MD;  Location: [facility] ENDOSCOPY;  Service: Cardiopulmonary;  Laterality: Bilateral;   VIDEO BRONCHOSCOPY N/A 10/08/2022   Procedure: VIDEO BRONCHOSCOPY WITHOUT FLUORO;  Surgeon: Alaine Vicenta NOVAK, MD;  Location: [facility] ENDOSCOPY;  Service: Cardiopulmonary;  Laterality: N/A;    Social History   Socioeconomic History   Marital status: Single    Spouse  name: Not on file   Number of children: 0   Years of education: Not on file   Highest education level: Not on file  Occupational History   Occupation: unemployed  Tobacco Use   Smoking status: Former    Current packs/day: 0.00    Average packs/day: 0.3 packs/day for 17.0 years (4.3 ttl pk-yrs)    Types: Cigarettes, Cigars    Start date: 03/22/1998    Quit date: 03/23/2015    Years since quitting: 9.7   Smokeless tobacco: Never  Vaping Use   Vaping status: Never Used  Substance and Sexual Activity   Alcohol use: Not Currently    Comment: occasional   Drug use: Yes    Frequency: 7.0 times per week    Types: Marijuana    Comment: daily   Sexual activity: Not Currently    Partners: Male    Comment: accepted condoms  Other Topics Concern   Not on file  Social History Narrative   Not on file   Social Drivers of Health   Tobacco Use: Medium Risk (12/12/2024)   Patient History    Smoking Tobacco Use: Former  Smokeless Tobacco Use: Never    Passive Exposure: Not on file  Financial Resource Strain: Low Risk (10/07/2023)   Overall Financial Resource Strain (CARDIA)    Difficulty of Paying Living Expenses: Not hard at all  Food Insecurity: No Food Insecurity (12/12/2024)   Epic    Worried About Programme Researcher, Broadcasting/film/video in the Last Year: Never true    Ran Out of Food in the Last Year: Never true  Transportation Needs: No Transportation Needs (12/12/2024)   Epic    Lack of Transportation (Medical): No    Lack of Transportation (Non-Medical): No  Physical Activity: Sufficiently Active (10/07/2023)   Exercise Vital Sign    Days of Exercise per Week: 3 days    Minutes of Exercise per Session: 60 min  Stress: No Stress Concern Present (10/07/2023)   [employer] of Occupational Health - Occupational Stress Questionnaire    Feeling of Stress : Not at all  Social Connections: Moderately Isolated (12/12/2024)   Social Connection and Isolation Panel    Frequency of Communication  with Friends and Family: More than three times a week    Frequency of Social Gatherings with Friends and Family: More than three times a week    Attends Religious Services: More than 4 times per year    Active Member of Clubs or Organizations: No    Attends Banker Meetings: Never    Marital Status: Never married  Intimate Partner Violence: Not At Risk (12/12/2024)   Epic    Fear of Current or Ex-Partner: No    Emotionally Abused: No    Physically Abused: No    Sexually Abused: No  Depression (PHQ2-9): Low Risk (12/12/2024)   Depression (PHQ2-9)    PHQ-2 Score: 0  Alcohol Screen: Low Risk (09/18/2022)   Alcohol Screen    Last Alcohol Screening Score (AUDIT): 1  Housing: Unknown (12/12/2024)   Epic    Unable to Pay for Housing in the Last Year: No    Number of Times Moved in the Last Year: Not on file    Homeless in the Last Year: No  Utilities: Not At Risk (12/12/2024)   Epic    Threatened with loss of utilities: No  Health Literacy: Adequate Health Literacy (12/12/2024)   B1300 Health Literacy    Frequency of need for help with medical instructions: Never    Family History  Problem Relation Age of Onset   Diabetes Mother    Eczema Mother    Asthma Mother    COPD Mother    Hypertension Mother    Hyperlipidemia Mother    Stroke Mother    Heart disease Mother    Sleep apnea Mother    Liver cancer Father    Asthma Brother    Diabetes Maternal Grandmother    Heart failure Maternal Grandmother    Colon cancer Neg Hx    Rectal cancer Neg Hx    Stomach cancer Neg Hx      Review of Systems  Constitutional: Negative.  Negative for chills and fever.  HENT: Negative.  Negative for congestion and sore throat.   Respiratory: Negative.  Negative for cough and shortness of breath.   Cardiovascular: Negative.  Negative for chest pain and palpitations.  Gastrointestinal:  Negative for abdominal pain, diarrhea, nausea and vomiting.  Genitourinary: Negative.   Negative for dysuria and hematuria.  Skin: Negative.  Negative for rash.  Neurological: Negative.  Negative for dizziness and headaches.  All other systems reviewed and are  negative.   Vitals:   12/12/24 1513  BP: 118/80  Pulse: (!) 108  Temp: 98.3 F (36.8 C)  SpO2: 93%    Physical Exam Vitals reviewed.  Constitutional:      Appearance: Normal appearance.  HENT:     Head: Normocephalic.     Right Ear: Tympanic membrane, ear canal and external ear normal.     Left Ear: Tympanic membrane, ear canal and external ear normal.     Mouth/Throat:     Mouth: Mucous membranes are moist.     Pharynx: Oropharynx is clear.  Eyes:     Extraocular Movements: Extraocular movements intact.     Conjunctiva/sclera: Conjunctivae normal.     Pupils: Pupils are equal, round, and reactive to light.  Cardiovascular:     Rate and Rhythm: Normal rate and regular rhythm.     Pulses: Normal pulses.     Heart sounds: Normal heart sounds.  Pulmonary:     Effort: Pulmonary effort is normal.     Breath sounds: Normal breath sounds.  Abdominal:     Palpations: Abdomen is soft.     Tenderness: There is no abdominal tenderness.  Musculoskeletal:     Cervical back: No tenderness.  Lymphadenopathy:     Cervical: No cervical adenopathy.  Skin:    General: Skin is warm and dry.  Neurological:     General: No focal deficit present.     Mental Status: He is alert and oriented to person, place, and time.  Psychiatric:        Mood and Affect: Mood normal.        Behavior: Behavior normal.      ASSESSMENT & PLAN: Problem List Items Addressed This Visit       Cardiovascular and Mediastinum   CAD (coronary artery disease)   History of anterior MI in 2010 with heavy thrombus burden, treated medically with antiplatelet and antithrombotic therapy. Status post inferior STEMI in 2019 treated DES to the RCA. He has been maintained on long-term dual antiplatelet therapy. He is not having chest discomfort  to suggest angina. Continue ASA 81 mg daily, Effient  10 mg daily, Toprol -XL 25 mg daily, Crestor . Follow-up in 6 months.       Relevant Orders   Comprehensive metabolic panel with GFR   Hemoglobin A1c   Ischemic cardiomyopathy   Clinically stable Continue daily baby aspirin  and prasugrel  10 mg daily      Relevant Orders   Comprehensive metabolic panel with GFR   Hemoglobin A1c     Respiratory   Pulmonary emphysema (HCC)   Clinically stable. No recent need for albuterol  inhaler       Relevant Orders   CBC with Differential/Platelet     Other   HIV (human immunodeficiency virus infection) (HCC)   Clinically stable. Continues Biktarvy  1 tablet daily      Marijuana abuse, continuous   Cardiovascular and cancer risks associated with smoking discussed Smoking cessation advice given      Hyperlipidemia   Chronic stable condition Continue rosuvastatin  20 mg daily      Relevant Orders   Hemoglobin A1c   Lipid panel   Sensation of pressure in bladder area   Chronic problem.  No complications. Differential diagnosis discussed Recommend blood work and urinalysis      Relevant Orders   Urinalysis   Other Visit Diagnoses       Encounter for general adult medical examination with abnormal findings    -  Primary  Relevant Orders   Urinalysis   Comprehensive metabolic panel with GFR   CBC with Differential/Platelet   Hemoglobin A1c   Lipid panel   PSA     Screening for deficiency anemia       Relevant Orders   CBC with Differential/Platelet     Screening for prostate cancer       Relevant Orders   PSA      Modifiable risk factors discussed with patient. Anticipatory guidance according to age provided. The following topics were also discussed: Social Determinants of Health Smoking and cardiovascular/cancer risks associated with this Diet and nutrition Benefits of exercise Cancer family history review Vaccinations review and recommendations Cardiovascular  risk assessment and need for blood work Review of multiple chronic medical conditions under management Review of all medications Mental health including depression and anxiety Fall and accident prevention  Patient Instructions  Health Maintenance, Male Adopting a healthy lifestyle and getting preventive care are important in promoting health and wellness. Ask your health care provider about: The right schedule for you to have regular tests and exams. Things you can do on your own to prevent diseases and keep yourself healthy. What should I know about diet, weight, and exercise? Eat a healthy diet  Eat a diet that includes plenty of vegetables, fruits, low-fat dairy products, and lean protein. Do not eat a lot of foods that are high in solid fats, added sugars, or sodium. Maintain a healthy weight Body mass index (BMI) is a measurement that can be used to identify possible weight problems. It estimates body fat based on height and weight. Your health care provider can help determine your BMI and help you achieve or maintain a healthy weight. Get regular exercise Get regular exercise. This is one of the most important things you can do for your health. Most adults should: Exercise for at least 150 minutes each week. The exercise should increase your heart rate and make you sweat (moderate-intensity exercise). Do strengthening exercises at least twice a week. This is in addition to the moderate-intensity exercise. Spend less time sitting. Even light physical activity can be beneficial. Watch cholesterol and blood lipids Have your blood tested for lipids and cholesterol at 44 years of age, then have this test every 5 years. You may need to have your cholesterol levels checked more often if: Your lipid or cholesterol levels are high. You are older than 44 years of age. You are at high risk for heart disease. What should I know about cancer screening? Many types of cancers can be detected  early and may often be prevented. Depending on your health history and family history, you may need to have cancer screening at various ages. This may include screening for: Colorectal cancer. Prostate cancer. Skin cancer. Lung cancer. What should I know about heart disease, diabetes, and high blood pressure? Blood pressure and heart disease High blood pressure causes heart disease and increases the risk of stroke. This is more likely to develop in people who have high blood pressure readings or are overweight. Talk with your health care provider about your target blood pressure readings. Have your blood pressure checked: Every 3-5 years if you are 31-65 years of age. Every year if you are 45 years old or older. If you are between the ages of 95 and 42 and are a current or former smoker, ask your health care provider if you should have a one-time screening for abdominal aortic aneurysm (AAA). Diabetes Have regular diabetes screenings. This checks  your fasting blood sugar level. Have the screening done: Once every three years after age 46 if you are at a normal weight and have a low risk for diabetes. More often and at a younger age if you are overweight or have a high risk for diabetes. What should I know about preventing infection? Hepatitis B If you have a higher risk for hepatitis B, you should be screened for this virus. Talk with your health care provider to find out if you are at risk for hepatitis B infection. Hepatitis C Blood testing is recommended for: Everyone born from [birth year] through 1966. Anyone with known risk factors for hepatitis C. Sexually transmitted infections (STIs) You should be screened each year for STIs, including gonorrhea and chlamydia, if: You are sexually active and are younger than 44 years of age. You are older than 44 years of age and your health care provider tells you that you are at risk for this type of infection. Your sexual activity has changed since  you were last screened, and you are at increased risk for chlamydia or gonorrhea. Ask your health care provider if you are at risk. Ask your health care provider about whether you are at high risk for HIV. Your health care provider may recommend a prescription medicine to help prevent HIV infection. If you choose to take medicine to prevent HIV, you should first get tested for HIV. You should then be tested every 3 months for as long as you are taking the medicine. Follow these instructions at home: Alcohol use Do not drink alcohol if your health care provider tells you not to drink. If you drink alcohol: Limit how much you have to 0-2 drinks a day. Know how much alcohol is in your drink. In the [place] one drink equals one 12 oz bottle of beer (355 mL), one 5 oz glass of wine (148 mL), or one 1 oz glass of hard liquor (44 mL). Lifestyle Do not use any products that contain nicotine or tobacco. These products include cigarettes, chewing tobacco, and vaping devices, such as e-cigarettes. If you need help quitting, ask your health care provider. Do not use street drugs. Do not share needles. Ask your health care provider for help if you need support or information about quitting drugs. General instructions Schedule regular health, dental, and eye exams. Stay current with your vaccines. Tell your health care provider if: You often feel depressed. You have ever been abused or do not feel safe at home. Summary Adopting a healthy lifestyle and getting preventive care are important in promoting health and wellness. Follow your health care provider's instructions about healthy diet, exercising, and getting tested or screened for diseases. Follow your health care provider's instructions on monitoring your cholesterol and blood pressure. This information is not intended to replace advice given to you by your health care provider. Make sure you discuss any questions you have with your health care  provider. Document Revised: 05/05/2021 Document Reviewed: 05/05/2021 Elsevier Patient Education  2024 Elsevier Inc.      Emil Schaumann, MD [facility]    [1] No Known Allergies

## 2024-12-12 NOTE — Patient Instructions (Signed)
 Mr. Ditommaso,  Thank you for taking the time for your Medicare Wellness Visit. I appreciate your continued commitment to your health goals. Please review the care plan we discussed, and feel free to reach out if I can assist you further.  Please note that Annual Wellness Visits do not include a physical exam. Some assessments may be limited, especially if the visit was conducted virtually. If needed, we may recommend an in-person follow-up with your provider.  Ongoing Care Seeing your primary care provider every 3 to 6 months helps us  monitor your health and provide consistent, personalized care.   Referrals If a referral was made during today's visit and you haven't received any updates within two weeks, please contact the referred provider directly to check on the status.  Recommended Screenings:  Health Maintenance  Topic Date Due   Hepatitis B Vaccine (3 of 3 - 19+ 3-dose series) 05/09/2015   COVID-19 Vaccine (5 - 2025-26 season) 08/28/2024   Medicare Annual Wellness Visit  12/12/2025   DTaP/Tdap/Td vaccine (2 - Td or Tdap) 10/28/2026   Pneumococcal Vaccine  Completed   Flu Shot  Completed   HPV Vaccine  Completed   Hepatitis C Screening  Completed   HIV Screening  Completed   Meningitis B Vaccine  Aged Out       07/24/2024    4:59 AM  Advanced Directives  Does Patient Have a Medical Advance Directive? No    Vision: Annual vision screenings are recommended for early detection of glaucoma, cataracts, and diabetic retinopathy. These exams can also reveal signs of chronic conditions such as diabetes and high blood pressure.  Dental: Annual dental screenings help detect early signs of oral cancer, gum disease, and other conditions linked to overall health, including heart disease and diabetes.

## 2024-12-13 ENCOUNTER — Ambulatory Visit: Payer: Self-pay | Admitting: Emergency Medicine

## 2024-12-13 LAB — LIPID PANEL
Cholesterol: 130 mg/dL (ref 28–200)
HDL: 55.7 mg/dL (ref >39.00)
LDL Cholesterol: 62 mg/dL (ref 10–99)
NonHDL: 74.39
Total CHOL/HDL Ratio: 2
Triglycerides: 60 mg/dL (ref 10.0–149.0)
VLDL: 12 mg/dL (ref 0.0–40.0)

## 2024-12-13 LAB — URINALYSIS, ROUTINE W REFLEX MICROSCOPIC
Bilirubin Urine: NEGATIVE
Hgb urine dipstick: NEGATIVE
Ketones, ur: NEGATIVE
Leukocytes,Ua: NEGATIVE
Nitrite: NEGATIVE
Specific Gravity, Urine: >=1.03 — AB (ref 1.000–1.030)
Total Protein, Urine: 100 — AB
Urine Glucose: NEGATIVE
Urobilinogen, UA: 4 — AB (ref 0.0–1.0)
pH: 6 (ref 5.0–8.0)

## 2024-12-13 LAB — CBC WITH DIFFERENTIAL/PLATELET
Basophils Absolute: 0.1 K/uL (ref 0.0–0.1)
Basophils Relative: 0.7 % (ref 0.0–3.0)
Eosinophils Absolute: 0 K/uL (ref 0.0–0.7)
Eosinophils Relative: 0.5 % (ref 0.0–5.0)
HCT: 43.1 % (ref 39.0–52.0)
Hemoglobin: 15.2 g/dL (ref 13.0–17.0)
Lymphocytes Relative: 37.8 % (ref 12.0–46.0)
Lymphs Abs: 3.6 K/uL (ref 0.7–4.0)
MCHC: 35.3 g/dL (ref 30.0–36.0)
MCV: 92.7 fl (ref 78.0–100.0)
Monocytes Absolute: 0.8 K/uL (ref 0.1–1.0)
Monocytes Relative: 8.6 % (ref 3.0–12.0)
Neutro Abs: 5.1 K/uL (ref 1.4–7.7)
Neutrophils Relative %: 52.4 % (ref 43.0–77.0)
Platelets: 256 K/uL (ref 150.0–400.0)
RBC: 4.64 Mil/uL (ref 4.22–5.81)
RDW: 13.8 % (ref 11.5–15.5)
WBC: 9.6 K/uL (ref 4.0–10.5)

## 2024-12-13 LAB — COMPREHENSIVE METABOLIC PANEL WITH GFR
ALT: 13 U/L (ref 3–53)
AST: 17 U/L (ref 5–37)
Albumin: 4.2 g/dL (ref 3.5–5.2)
Alkaline Phosphatase: 92 U/L (ref 39–117)
BUN: 11 mg/dL (ref 6–23)
CO2: 31 meq/L (ref 19–32)
Calcium: 9.4 mg/dL (ref 8.4–10.5)
Chloride: 100 meq/L (ref 96–112)
Creatinine, Ser: 1.11 mg/dL (ref 0.40–1.50)
GFR: 80.96 mL/min (ref >60.00)
Glucose, Bld: 96 mg/dL (ref 70–99)
Potassium: 3.8 meq/L (ref 3.5–5.1)
Sodium: 137 meq/L (ref 135–145)
Total Bilirubin: 0.5 mg/dL (ref 0.2–1.2)
Total Protein: 7.5 g/dL (ref 6.0–8.3)

## 2024-12-13 LAB — PSA: PSA: 0.98 ng/mL (ref 0.10–4.00)

## 2024-12-13 LAB — HEMOGLOBIN A1C: Hgb A1c MFr Bld: 5.9 % (ref 4.6–6.5)

## 2025-01-02 NOTE — Progress Notes (Signed)
 The ASCVD Risk score (Arnett DK, et al., 2019) failed to calculate for the following reasons:   Risk score cannot be calculated because patient has a medical history suggesting prior/existing ASCVD   * - Cholesterol units were assumed  Duwaine Lowe, BSN, [facility]

## 2025-01-18 ENCOUNTER — Ambulatory Visit: Admitting: Infectious Diseases

## 2025-01-20 ENCOUNTER — Other Ambulatory Visit: Payer: Self-pay | Admitting: Infectious Diseases

## 2025-01-20 DIAGNOSIS — Z21 Asymptomatic human immunodeficiency virus [HIV] infection status: Secondary | ICD-10-CM

## 2025-01-23 NOTE — Telephone Encounter (Signed)
 Appt 12/8

## 2025-01-24 ENCOUNTER — Other Ambulatory Visit (HOSPITAL_COMMUNITY)
Admission: RE | Admit: 2025-01-24 | Discharge: 2025-01-24 | Disposition: A | Discharge status: Home / Self Care | Source: Ambulatory Visit | Attending: Internal Medicine | Admitting: Internal Medicine

## 2025-01-24 ENCOUNTER — Other Ambulatory Visit: Payer: Self-pay

## 2025-01-24 ENCOUNTER — Ambulatory Visit: Admitting: Internal Medicine

## 2025-01-24 ENCOUNTER — Encounter: Payer: Self-pay | Admitting: Internal Medicine

## 2025-01-24 VITALS — BP 118/76 | HR 98 | Temp 98.9°F | Ht 65.0 in | Wt 139.0 lb

## 2025-01-24 DIAGNOSIS — Z21 Asymptomatic human immunodeficiency virus [HIV] infection status: Secondary | ICD-10-CM

## 2025-01-24 DIAGNOSIS — B20 Human immunodeficiency virus [HIV] disease: Secondary | ICD-10-CM | POA: Diagnosis present

## 2025-01-24 MED ORDER — BIKTARVY 50-200-25 MG PO TABS: 1.0000 | ORAL_TABLET | Freq: Every day | ORAL | 6 refills | Status: AC

## 2025-01-26 LAB — COMPLETE METABOLIC PANEL WITHOUT GFR
AG Ratio: 1.2 (calc) (ref 1.0–2.5)
ALT: 10 U/L (ref 9–46)
AST: 14 U/L (ref 10–40)
Albumin: 4.2 g/dL (ref 3.6–5.1)
Alkaline phosphatase (APISO): 95 U/L (ref 36–130)
BUN: 12 mg/dL (ref 7–25)
CO2: 31 mmol/L (ref 20–32)
Calcium: 9.4 mg/dL (ref 8.6–10.3)
Chloride: 101 mmol/L (ref 98–110)
Creat: 1.04 mg/dL (ref 0.60–1.29)
Globulin: 3.4 g/dL (ref 1.9–3.7)
Glucose, Bld: 89 mg/dL (ref 65–99)
Potassium: 3.6 mmol/L (ref 3.5–5.3)
Sodium: 138 mmol/L (ref 135–146)
Total Bilirubin: 0.6 mg/dL (ref 0.2–1.2)
Total Protein: 7.6 g/dL (ref 6.1–8.1)

## 2025-01-26 LAB — CYTOLOGY, (ORAL, ANAL, URETHRAL) ANCILLARY ONLY
Chlamydia: NEGATIVE
Chlamydia: NEGATIVE
Comment: NEGATIVE
Comment: NEGATIVE
Comment: NORMAL
Comment: NORMAL
Neisseria Gonorrhea: NEGATIVE
Neisseria Gonorrhea: NEGATIVE

## 2025-01-26 LAB — CBC WITH DIFFERENTIAL/PLATELET
Absolute Lymphocytes: 3856 {cells}/uL (ref 850–3900)
Absolute Monocytes: 875 {cells}/uL (ref 200–950)
Basophils Absolute: 54 {cells}/uL (ref 0–200)
Basophils Relative: 0.5 %
Eosinophils Absolute: 65 {cells}/uL (ref 15–500)
Eosinophils Relative: 0.6 %
HCT: 43.3 % (ref 39.4–51.1)
Hemoglobin: 15 g/dL (ref 13.2–17.1)
MCH: 31.9 pg (ref 27.0–33.0)
MCHC: 34.6 g/dL (ref 31.6–35.4)
MCV: 92.1 fL (ref 81.4–101.7)
MPV: 10 fL (ref 7.5–12.5)
Monocytes Relative: 8.1 %
Neutro Abs: 5951 {cells}/uL (ref 1500–7800)
Neutrophils Relative %: 55.1 %
Platelets: 262 10*3/uL (ref 140–400)
RBC: 4.7 Million/uL (ref 4.20–5.80)
RDW: 13.3 % (ref 11.0–15.0)
Total Lymphocyte: 35.7 %
WBC: 10.8 10*3/uL (ref 3.8–10.8)

## 2025-01-26 LAB — HIV-1 RNA QUANT-NO REFLEX-BLD
HIV 1 RNA Quant: 21 {copies}/mL — ABNORMAL HIGH
HIV-1 RNA Quant, Log: 1.32 {Log_copies}/mL — ABNORMAL HIGH

## 2025-01-26 LAB — URINE CYTOLOGY ANCILLARY ONLY
Chlamydia: NEGATIVE
Comment: NEGATIVE
Comment: NORMAL
Neisseria Gonorrhea: NEGATIVE

## 2025-01-26 LAB — RPR TITER: RPR Titer: 1:32 {titer} — ABNORMAL HIGH

## 2025-01-26 LAB — SYPHILIS: RPR W/REFLEX TO RPR TITER AND TREPONEMAL ANTIBODIES, TRADITIONAL SCREENING AND DIAGNOSIS ALGORITHM: RPR Ser Ql: REACTIVE — AB

## 2025-01-26 LAB — T-HELPER CELLS (CD4) COUNT (NOT AT ARMC)
CD4 % Helper T Cell: 30 % — ABNORMAL LOW (ref 33–65)
CD4 T Cell Abs: 1035 /uL (ref 400–1790)

## 2025-01-26 LAB — T PALLIDUM AB: T Pallidum Abs: POSITIVE — AB

## 2025-02-01 ENCOUNTER — Ambulatory Visit: Payer: Self-pay | Admitting: Internal Medicine

## 2025-05-24 ENCOUNTER — Ambulatory Visit: Payer: Self-pay | Admitting: Internal Medicine

## 2025-06-14 ENCOUNTER — Ambulatory Visit: Admitting: Emergency Medicine

## 2025-12-19 ENCOUNTER — Ambulatory Visit

## 2025-12-19 ENCOUNTER — Encounter: Admitting: Emergency Medicine
